# Patient Record
Sex: Female | Born: 1960 | Race: White | Hispanic: No | Marital: Married | Smoking: Former smoker
Health system: Southern US, Community
[De-identification: ages and names within clinical notes are randomized; demographics above are authoritative.]

## PROBLEM LIST (undated history)

## (undated) DIAGNOSIS — I2510 Atherosclerotic heart disease of native coronary artery without angina pectoris: Secondary | ICD-10-CM

## (undated) DIAGNOSIS — M6701 Short Achilles tendon (acquired), right ankle: Secondary | ICD-10-CM

## (undated) DIAGNOSIS — G5601 Carpal tunnel syndrome, right upper limb: Secondary | ICD-10-CM

## (undated) DIAGNOSIS — T8859XA Other complications of anesthesia, initial encounter: Secondary | ICD-10-CM

## (undated) DIAGNOSIS — J189 Pneumonia, unspecified organism: Secondary | ICD-10-CM

## (undated) DIAGNOSIS — G709 Myoneural disorder, unspecified: Secondary | ICD-10-CM

## (undated) DIAGNOSIS — N63 Unspecified lump in unspecified breast: Secondary | ICD-10-CM

## (undated) DIAGNOSIS — E119 Type 2 diabetes mellitus without complications: Secondary | ICD-10-CM

## (undated) DIAGNOSIS — E785 Hyperlipidemia, unspecified: Secondary | ICD-10-CM

## (undated) DIAGNOSIS — J45909 Unspecified asthma, uncomplicated: Secondary | ICD-10-CM

## (undated) DIAGNOSIS — R519 Headache, unspecified: Secondary | ICD-10-CM

## (undated) DIAGNOSIS — N959 Unspecified menopausal and perimenopausal disorder: Secondary | ICD-10-CM

## (undated) DIAGNOSIS — I739 Peripheral vascular disease, unspecified: Secondary | ICD-10-CM

## (undated) DIAGNOSIS — F329 Major depressive disorder, single episode, unspecified: Secondary | ICD-10-CM

## (undated) DIAGNOSIS — M1990 Unspecified osteoarthritis, unspecified site: Secondary | ICD-10-CM

## (undated) DIAGNOSIS — I10 Essential (primary) hypertension: Principal | ICD-10-CM

## (undated) DIAGNOSIS — K219 Gastro-esophageal reflux disease without esophagitis: Secondary | ICD-10-CM

## (undated) DIAGNOSIS — S37009A Unspecified injury of unspecified kidney, initial encounter: Secondary | ICD-10-CM

## (undated) DIAGNOSIS — E039 Hypothyroidism, unspecified: Secondary | ICD-10-CM

## (undated) DIAGNOSIS — J309 Allergic rhinitis, unspecified: Secondary | ICD-10-CM

## (undated) DIAGNOSIS — F419 Anxiety disorder, unspecified: Secondary | ICD-10-CM

## (undated) DIAGNOSIS — M545 Low back pain: Secondary | ICD-10-CM

## (undated) DIAGNOSIS — K759 Inflammatory liver disease, unspecified: Secondary | ICD-10-CM

## (undated) HISTORY — DX: Unspecified lump in unspecified breast: N63.0

## (undated) HISTORY — DX: Hyperlipidemia, unspecified: E78.5

## (undated) HISTORY — PX: HAGLAND'S DEFORMITY EXCISION: SHX1718

## (undated) HISTORY — PX: TUBAL LIGATION: SHX77

## (undated) HISTORY — DX: Allergic rhinitis, unspecified: J30.9

## (undated) HISTORY — DX: Short Achilles tendon (acquired), right ankle: M67.01

## (undated) HISTORY — DX: Hypothyroidism, unspecified: E03.9

## (undated) HISTORY — PX: FEMORAL BYPASS: SHX50

## (undated) HISTORY — DX: Major depressive disorder, single episode, unspecified: F32.9

## (undated) HISTORY — DX: Peripheral vascular disease, unspecified: I73.9

## (undated) HISTORY — DX: Low back pain: M54.5

## (undated) HISTORY — DX: Essential (primary) hypertension: I10

## (undated) HISTORY — DX: Unspecified menopausal and perimenopausal disorder: N95.9

## (undated) HISTORY — PX: CHOLECYSTECTOMY: SHX55

---

## 1983-02-15 HISTORY — PX: APPENDECTOMY: SHX54

## 1983-02-15 HISTORY — PX: CHOLECYSTECTOMY: SHX55

## 1994-02-14 HISTORY — PX: TUBAL LIGATION: SHX77

## 2004-10-14 ENCOUNTER — Encounter
Admission: RE | Admit: 2004-10-14 | Discharge: 2004-10-14 | Payer: Self-pay | Admitting: Physical Medicine and Rehabilitation

## 2004-11-29 ENCOUNTER — Encounter: Admission: RE | Admit: 2004-11-29 | Discharge: 2004-11-29 | Payer: Self-pay | Admitting: *Deleted

## 2005-02-21 ENCOUNTER — Inpatient Hospital Stay (HOSPITAL_COMMUNITY): Admission: RE | Admit: 2005-02-21 | Discharge: 2005-03-02 | Payer: Self-pay | Admitting: *Deleted

## 2005-03-04 ENCOUNTER — Inpatient Hospital Stay (HOSPITAL_COMMUNITY): Admission: EM | Admit: 2005-03-04 | Discharge: 2005-03-08 | Payer: Self-pay | Admitting: Emergency Medicine

## 2005-03-10 ENCOUNTER — Ambulatory Visit (HOSPITAL_COMMUNITY): Admission: RE | Admit: 2005-03-10 | Discharge: 2005-03-10 | Payer: Self-pay | Admitting: Surgical

## 2005-06-17 ENCOUNTER — Ambulatory Visit: Payer: Self-pay | Admitting: Internal Medicine

## 2005-07-29 ENCOUNTER — Ambulatory Visit: Payer: Self-pay | Admitting: Internal Medicine

## 2006-03-06 ENCOUNTER — Ambulatory Visit: Payer: Self-pay | Admitting: Internal Medicine

## 2006-08-04 ENCOUNTER — Encounter: Payer: Self-pay | Admitting: Internal Medicine

## 2006-08-04 ENCOUNTER — Ambulatory Visit: Payer: Self-pay | Admitting: Internal Medicine

## 2006-08-04 DIAGNOSIS — E119 Type 2 diabetes mellitus without complications: Secondary | ICD-10-CM | POA: Insufficient documentation

## 2006-08-04 DIAGNOSIS — E039 Hypothyroidism, unspecified: Secondary | ICD-10-CM

## 2006-08-04 DIAGNOSIS — M545 Low back pain, unspecified: Secondary | ICD-10-CM | POA: Insufficient documentation

## 2006-08-04 DIAGNOSIS — E785 Hyperlipidemia, unspecified: Secondary | ICD-10-CM | POA: Insufficient documentation

## 2006-08-04 DIAGNOSIS — I10 Essential (primary) hypertension: Secondary | ICD-10-CM | POA: Insufficient documentation

## 2006-08-04 DIAGNOSIS — I152 Hypertension secondary to endocrine disorders: Secondary | ICD-10-CM | POA: Insufficient documentation

## 2006-08-04 DIAGNOSIS — E1169 Type 2 diabetes mellitus with other specified complication: Secondary | ICD-10-CM | POA: Insufficient documentation

## 2006-08-04 HISTORY — DX: Hypothyroidism, unspecified: E03.9

## 2006-08-04 HISTORY — DX: Hyperlipidemia, unspecified: E78.5

## 2006-08-04 HISTORY — DX: Low back pain, unspecified: M54.50

## 2006-08-04 HISTORY — DX: Essential (primary) hypertension: I10

## 2007-06-26 ENCOUNTER — Telehealth: Payer: Self-pay | Admitting: Internal Medicine

## 2007-06-26 ENCOUNTER — Ambulatory Visit: Payer: Self-pay | Admitting: Internal Medicine

## 2007-06-26 LAB — CONVERTED CEMR LAB
ALT: 52 units/L — ABNORMAL HIGH (ref 0–35)
AST: 39 units/L — ABNORMAL HIGH (ref 0–37)
Albumin: 3.8 g/dL (ref 3.5–5.2)
Alkaline Phosphatase: 100 units/L (ref 39–117)
BUN: 9 mg/dL (ref 6–23)
Basophils Absolute: 0.1 10*3/uL (ref 0.0–0.1)
Basophils Relative: 0.9 % (ref 0.0–1.0)
Bilirubin Urine: NEGATIVE
Bilirubin, Direct: 0.1 mg/dL (ref 0.0–0.3)
Blood in Urine, dipstick: NEGATIVE
CO2: 28 meq/L (ref 19–32)
Calcium: 9.4 mg/dL (ref 8.4–10.5)
Chloride: 111 meq/L (ref 96–112)
Cholesterol: 188 mg/dL (ref 0–200)
Creatinine, Ser: 0.9 mg/dL (ref 0.4–1.2)
Eosinophils Absolute: 0.2 10*3/uL (ref 0.0–0.7)
Eosinophils Relative: 3.8 % (ref 0.0–5.0)
GFR calc Af Amer: 86 mL/min
GFR calc non Af Amer: 71 mL/min
Glucose, Bld: 110 mg/dL — ABNORMAL HIGH (ref 70–99)
Glucose, Urine, Semiquant: NEGATIVE
HCT: 41.2 % (ref 36.0–46.0)
HDL: 31.4 mg/dL — ABNORMAL LOW (ref >39.0)
Hemoglobin: 14.2 g/dL (ref 12.0–15.0)
Ketones, urine, test strip: NEGATIVE
LDL Cholesterol: 138 mg/dL — ABNORMAL HIGH (ref 0–99)
Lymphocytes Relative: 18 % (ref 12.0–46.0)
MCHC: 34.4 g/dL (ref 30.0–36.0)
MCV: 90.5 fL (ref 78.0–100.0)
Monocytes Absolute: 0.6 10*3/uL (ref 0.1–1.0)
Monocytes Relative: 9.2 % (ref 3.0–12.0)
Neutro Abs: 4 10*3/uL (ref 1.4–7.7)
Neutrophils Relative %: 68.1 % (ref 43.0–77.0)
Nitrite: NEGATIVE
Platelets: 207 10*3/uL (ref 150–400)
Potassium: 4.1 meq/L (ref 3.5–5.1)
RBC: 4.55 M/uL (ref 3.87–5.11)
RDW: 11.8 % (ref 11.5–14.6)
Sodium: 144 meq/L (ref 135–145)
Specific Gravity, Urine: 1.025
TSH: 0.03 microintl units/mL — ABNORMAL LOW (ref 0.35–5.50)
Total Bilirubin: 0.5 mg/dL (ref 0.3–1.2)
Total CHOL/HDL Ratio: 6
Total Protein: 7.2 g/dL (ref 6.0–8.3)
Triglycerides: 93 mg/dL (ref 0–149)
Urobilinogen, UA: 0.2
VLDL: 19 mg/dL (ref 0–40)
WBC Urine, dipstick: NEGATIVE
WBC: 6 10*3/uL (ref 4.5–10.5)
pH: 5

## 2008-04-10 ENCOUNTER — Ambulatory Visit: Payer: Self-pay | Admitting: Internal Medicine

## 2008-04-10 DIAGNOSIS — J309 Allergic rhinitis, unspecified: Secondary | ICD-10-CM | POA: Insufficient documentation

## 2008-04-10 HISTORY — DX: Allergic rhinitis, unspecified: J30.9

## 2008-05-02 ENCOUNTER — Encounter: Payer: Self-pay | Admitting: Internal Medicine

## 2008-05-08 ENCOUNTER — Ambulatory Visit: Payer: Self-pay | Admitting: Internal Medicine

## 2008-05-08 DIAGNOSIS — N959 Unspecified menopausal and perimenopausal disorder: Secondary | ICD-10-CM

## 2008-05-08 HISTORY — DX: Unspecified menopausal and perimenopausal disorder: N95.9

## 2008-06-23 ENCOUNTER — Telehealth: Payer: Self-pay | Admitting: Internal Medicine

## 2008-08-06 ENCOUNTER — Ambulatory Visit: Payer: Self-pay | Admitting: Internal Medicine

## 2008-08-06 LAB — CONVERTED CEMR LAB
ALT: 52 units/L — ABNORMAL HIGH (ref 0–35)
AST: 52 units/L — ABNORMAL HIGH (ref 0–37)
Albumin: 3.8 g/dL (ref 3.5–5.2)
Alkaline Phosphatase: 95 units/L (ref 39–117)
BUN: 12 mg/dL (ref 6–23)
Basophils Absolute: 0 10*3/uL (ref 0.0–0.1)
Basophils Relative: 0.5 % (ref 0.0–3.0)
Bilirubin Urine: NEGATIVE
Bilirubin, Direct: 0.1 mg/dL (ref 0.0–0.3)
Blood in Urine, dipstick: NEGATIVE
CO2: 26 meq/L (ref 19–32)
Calcium: 9 mg/dL (ref 8.4–10.5)
Chloride: 108 meq/L (ref 96–112)
Cholesterol: 156 mg/dL (ref 0–200)
Creatinine, Ser: 0.9 mg/dL (ref 0.4–1.2)
Eosinophils Absolute: 0.2 10*3/uL (ref 0.0–0.7)
Eosinophils Relative: 3.4 % (ref 0.0–5.0)
GFR calc non Af Amer: 70.93 mL/min (ref >60)
Glucose, Bld: 116 mg/dL — ABNORMAL HIGH (ref 70–99)
Glucose, Urine, Semiquant: NEGATIVE
HCT: 39.4 % (ref 36.0–46.0)
HDL: 30.8 mg/dL — ABNORMAL LOW (ref >39.00)
Hemoglobin: 13.7 g/dL (ref 12.0–15.0)
Ketones, urine, test strip: NEGATIVE
LDL Cholesterol: 103 mg/dL — ABNORMAL HIGH (ref 0–99)
Lymphocytes Relative: 23.1 % (ref 12.0–46.0)
Lymphs Abs: 1.3 10*3/uL (ref 0.7–4.0)
MCHC: 34.8 g/dL (ref 30.0–36.0)
MCV: 93.4 fL (ref 78.0–100.0)
Monocytes Absolute: 0.4 10*3/uL (ref 0.1–1.0)
Monocytes Relative: 6.6 % (ref 3.0–12.0)
Neutro Abs: 3.9 10*3/uL (ref 1.4–7.7)
Neutrophils Relative %: 66.4 % (ref 43.0–77.0)
Nitrite: NEGATIVE
Platelets: 169 10*3/uL (ref 150.0–400.0)
Potassium: 3.9 meq/L (ref 3.5–5.1)
Protein, U semiquant: NEGATIVE
RBC: 4.22 M/uL (ref 3.87–5.11)
RDW: 11.8 % (ref 11.5–14.6)
Sodium: 141 meq/L (ref 135–145)
Specific Gravity, Urine: 1.02
TSH: 2.63 microintl units/mL (ref 0.35–5.50)
Total Bilirubin: 0.7 mg/dL (ref 0.3–1.2)
Total CHOL/HDL Ratio: 5
Total Protein: 7.5 g/dL (ref 6.0–8.3)
Triglycerides: 111 mg/dL (ref 0.0–149.0)
Urobilinogen, UA: 0.2
VLDL: 22.2 mg/dL (ref 0.0–40.0)
WBC: 5.8 10*3/uL (ref 4.5–10.5)
pH: 5

## 2008-08-13 ENCOUNTER — Ambulatory Visit: Payer: Self-pay | Admitting: Internal Medicine

## 2008-08-13 DIAGNOSIS — F329 Major depressive disorder, single episode, unspecified: Secondary | ICD-10-CM

## 2008-08-13 DIAGNOSIS — F321 Major depressive disorder, single episode, moderate: Secondary | ICD-10-CM | POA: Insufficient documentation

## 2008-08-13 DIAGNOSIS — F3289 Other specified depressive episodes: Secondary | ICD-10-CM

## 2008-08-13 HISTORY — DX: Other specified depressive episodes: F32.89

## 2008-08-13 HISTORY — DX: Major depressive disorder, single episode, unspecified: F32.9

## 2009-02-16 ENCOUNTER — Ambulatory Visit: Payer: Self-pay | Admitting: Internal Medicine

## 2009-08-11 ENCOUNTER — Ambulatory Visit: Payer: Self-pay | Admitting: Internal Medicine

## 2009-08-11 LAB — CONVERTED CEMR LAB
ALT: 72 units/L — ABNORMAL HIGH (ref 0–35)
AST: 62 units/L — ABNORMAL HIGH (ref 0–37)
Albumin: 4.2 g/dL (ref 3.5–5.2)
Alkaline Phosphatase: 114 units/L (ref 39–117)
BUN: 14 mg/dL (ref 6–23)
Basophils Relative: 0.9 % (ref 0.0–3.0)
Bilirubin Urine: NEGATIVE
Bilirubin, Direct: 0.2 mg/dL (ref 0.0–0.3)
Blood in Urine, dipstick: NEGATIVE
CO2: 27 meq/L (ref 19–32)
Calcium: 9.4 mg/dL (ref 8.4–10.5)
Chloride: 106 meq/L (ref 96–112)
Cholesterol: 187 mg/dL (ref 0–200)
Creatinine, Ser: 0.9 mg/dL (ref 0.4–1.2)
Eosinophils Relative: 3.1 % (ref 0.0–5.0)
GFR calc non Af Amer: 68.01 mL/min (ref >60)
Glucose, Bld: 101 mg/dL — ABNORMAL HIGH (ref 70–99)
Glucose, Urine, Semiquant: NEGATIVE
HCT: 39.3 % (ref 36.0–46.0)
HDL: 40 mg/dL (ref >39.00)
Hemoglobin: 13.4 g/dL (ref 12.0–15.0)
Ketones, urine, test strip: NEGATIVE
LDL Cholesterol: 116 mg/dL — ABNORMAL HIGH (ref 0–99)
Lymphocytes Relative: 22.3 % (ref 12.0–46.0)
MCHC: 34 g/dL (ref 30.0–36.0)
MCV: 95.6 fL (ref 78.0–100.0)
Monocytes Relative: 7.4 % (ref 3.0–12.0)
Neutrophils Relative %: 66.3 % (ref 43.0–77.0)
Nitrite: NEGATIVE
Platelets: 160 10*3/uL (ref 150.0–400.0)
Potassium: 4.4 meq/L (ref 3.5–5.1)
Protein, U semiquant: NEGATIVE
RBC: 4.11 M/uL (ref 3.87–5.11)
RDW: 13.5 % (ref 11.5–14.6)
Sodium: 142 meq/L (ref 135–145)
Specific Gravity, Urine: 1.02
TSH: 25.33 microintl units/mL — ABNORMAL HIGH (ref 0.35–5.50)
Total Bilirubin: 0.4 mg/dL (ref 0.3–1.2)
Total CHOL/HDL Ratio: 5
Total Protein: 7.7 g/dL (ref 6.0–8.3)
Triglycerides: 155 mg/dL — ABNORMAL HIGH (ref 0.0–149.0)
Urobilinogen, UA: 0.2
VLDL: 31 mg/dL (ref 0.0–40.0)
WBC: 10.5 10*3/uL (ref 4.5–10.5)
pH: 5.5

## 2009-08-24 ENCOUNTER — Ambulatory Visit: Payer: Self-pay | Admitting: Internal Medicine

## 2010-03-16 NOTE — Assessment & Plan Note (Signed)
Summary: cpx/cjr/pt rescd from bump//ccm   Vital Signs:  Patient profile:   50 year old female Height:      65 inches Weight:      200 pounds BMI:     33.40 Temp:     98.1 degrees F oral BP sitting:   120 / 80  (right arm) Cuff size:   regular  Vitals Entered By: Cay Schillings LPN (August 25, 7987 2:11 PM) CC: cpx - doing well Is Patient Diabetic? No   CC:  cpx - doing well.  History of Present Illness: 50 year old patient who is seen today for a annual health examination.  Medical problems include VAD.  She is status post vascular bypass surgery.  She has treated hypertension and dyslipidemia, and also a history of hypothyroidism.  She has allergic rhinitis.  History depression  Allergies: 1)  * Flu Shot  Past History:  Past Medical History: Reviewed history from 08/13/2008 and no changes required. 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 Cholecystectomy Aortobifemoral bypass 2007  Family History: Reviewed history from 08/13/2008 and no changes required. Family History of CAD Female 1st degree relative <50 Father died 97  ASCVD/MI Mother died  age 59 -SDAT, HTN MGM-DM Uncles-CVA 3 brothers-   Social History: Reviewed history from 08/13/2008 and no changes required. Married- 30 yrs tobacco 30 yrs until 2007   Regular exercise-yes  Review of Systems       The patient complains of weight gain.  The patient denies anorexia, fever, weight loss, vision loss, decreased hearing, hoarseness, chest pain, syncope, dyspnea on exertion, peripheral edema, prolonged cough, headaches, hemoptysis, abdominal pain, melena, hematochezia, severe indigestion/heartburn, hematuria, incontinence, genital sores, muscle weakness, suspicious skin lesions,  transient blindness, difficulty walking, depression, unusual weight change, abnormal bleeding, enlarged lymph nodes, angioedema, and breast masses.    Physical Exam  General:  overweight-appearing.  120/80overweight-appearing.   Head:  Normocephalic and atraumatic without obvious abnormalities. No apparent alopecia or balding. Eyes:  No corneal or conjunctival inflammation noted. EOMI. Perrla. Funduscopic exam benign, without hemorrhages, exudates or papilledema. Vision grossly normal. Ears:  External ear exam shows no significant lesions or deformities.  Otoscopic examination reveals clear canals, tympanic membranes are intact bilaterally without bulging, retraction, inflammation or discharge. Hearing is grossly normal bilaterally. Mouth:  Oral mucosa and oropharynx without lesions or exudates.   Neck:  No deformities, masses, or tenderness noted. Chest Wall:  No deformities, masses, or tenderness noted. Lungs:  Normal respiratory effort, chest expands symmetrically. Lungs are clear to auscultation, no crackles or wheezes. Heart:  Normal rate and regular rhythm. S1 and S2 normal without gallop, murmur, click, rub or other extra sounds. Abdomen:  obese soft and nontender.  No organomegaly.  Surgical scars in the midline and right upper quadrant noted Msk:  No deformity or scoliosis noted of thoracic or lumbar spine.   Pulses:  dorsalis pedis pulses intact.  Posterior tibial  pulses faint Extremities:  No clubbing, cyanosis, edema, or deformity noted with normal full range of motion of all joints.   Neurologic:  No cranial nerve deficits noted. Station and gait are normal. Plantar reflexes are down-going bilaterally. DTRs are symmetrical throughout. Sensory, motor and coordinative functions appear intact. Skin:  Intact without suspicious lesions or rashes Cervical Nodes:  No lymphadenopathy noted Axillary Nodes:  No palpable lymphadenopathy Inguinal Nodes:  No significant  adenopathy Psych:   Cognition and judgment appear intact. Alert and cooperative with normal attention span and concentration. No apparent delusions, illusions, hallucinations   Impression & Recommendations:  Problem # 1:  PREVENTIVE HEALTH CARE (ICD-V70.0)  Complete Medication List: 1)  Benazepril Hcl 20 Mg Tabs (Benazepril hcl) .... Take 1 tablet by mouth once a day 2)  Metoprolol Tartrate 50 Mg Tabs (Metoprolol tartrate) .... Take 1 tablet by mouth twice a day 3)  Pravastatin Sodium 40 Mg Tabs (Pravastatin sodium) .... One daily 4)  Fexofenadine Hcl 180 Mg Tabs (Fexofenadine hcl) .... One daily 5)  Fluticasone Propionate 50 Mcg/act Susp (Fluticasone propionate) .... Use daily 6)  Venlafaxine Hcl 50 Mg Tabs (Venlafaxine hcl) .... One every morning 7)  Levothyroxine Sodium 100 Mcg Tabs (Levothyroxine sodium) .... One daily  Patient Instructions: 1)  Please schedule a follow-up appointment in 3 months to check your TSH 2)  TSH prior to visit, ICD-9:  244.9  3)  Limit your Sodium (Salt) to less than 2 grams a day(slightly less than 1/2 a teaspoon) to prevent fluid retention, swelling, or worsening of symptoms. 4)  It is important that you exercise regularly at least 20 minutes 5 times a week. If you develop chest pain, have severe difficulty breathing, or feel very tired , stop exercising immediately and seek medical attention. 5)  You need to lose weight. Consider a lower calorie diet and regular exercise.  6)  Check your Blood Pressure regularly. If it is above: 150/90  you should make an appointment. 7)  Schedule your mammogram. 8)  Take calcium +Vitamin D daily. 9)  gynecology follow-up as discussed Prescriptions: LEVOTHYROXINE SODIUM 100 MCG TABS (LEVOTHYROXINE SODIUM) one daily  #90 x 6   Entered and Authorized by:   Marletta Lor  MD   Signed by:   Marletta Lor  MD on 08/24/2009   Method used:   Electronically to        [facility] (retail)       [place], [place]  [address]       Ph: [phone]       Fax: [phone]   RxID:   [id number] VENLAFAXINE HCL 50 MG TABS (VENLAFAXINE HCL) one every morning  #90 x 6   Entered and Authorized by:   Marletta Lor  MD   Signed by:   Marletta Lor  MD on 08/24/2009   Method used:   Electronically to        [facility] (retail)       [place], [place]  [address]       Ph: [phone]       Fax: [phone]   RxID:   [id number] FLUTICASONE PROPIONATE 50 MCG/ACT SUSP (FLUTICASONE PROPIONATE) use daily  #1 x 6   Entered and Authorized by:   Marletta Lor  MD   Signed by:   Marletta Lor  MD on 08/24/2009   Method used:   Electronically to        [facility] (retail)       [place], [place]  [address]       Ph: [phone]       Fax: [phone]   RxID:   [id number] FEXOFENADINE HCL 180 MG TABS (FEXOFENADINE  HCL) one daily  #90 x 6   Entered and Authorized by:   Marletta Lor  MD   Signed by:   Marletta Lor  MD on 08/24/2009   Method used:   Electronically to        [facility] (retail)       [place], [place]  [address]       Ph: [phone]       Fax: [phone]   RxID:   [id number] PRAVASTATIN SODIUM 40 MG TABS (PRAVASTATIN SODIUM) one daily  #90 Tablet x 3   Entered and Authorized by:   Marletta Lor  MD   Signed by:   Marletta Lor  MD on 08/24/2009   Method used:   Electronically to        [facility] (retail)       [place], [place]  [address]       Ph: [phone]       Fax: [phone]   RxID:   [id number] METOPROLOL TARTRATE 50 MG TABS (METOPROLOL TARTRATE) Take 1 tablet by mouth twice a day  #180 x 3   Entered and Authorized by:   Marletta Lor  MD   Signed by:   Marletta Lor  MD on 08/24/2009   Method used:   Electronically to        [facility] (retail)       [place], [place]  [address]       Ph: [phone]       Fax: [phone]   RxID:   [id number] BENAZEPRIL HCL 20 MG TABS (BENAZEPRIL HCL) Take 1 tablet by mouth once a day  #90 Tablet x 2   Entered and Authorized by:   Marletta Lor  MD   Signed by:   Marletta Lor  MD on 08/24/2009   Method used:   Electronically to        [facility] (retail)       [place], [place]  [address]       Ph: [phone]       Fax: [phone]   RxID:   [id number]

## 2010-03-16 NOTE — Assessment & Plan Note (Signed)
Summary: 6 MNTH ROV//SLM/PT RESCD FROM BUMP//CCM   Vital Signs:  Patient profile:   50 year old female Weight:      193 pounds Temp:     98.0 degrees F oral BP sitting:   100 / 78  (left arm) Cuff size:   regular  Vitals Entered By: Chipper Oman, RN (February 16, 2009 10:30 AM) CC: 6 mo ROV   CC:  6 mo ROV.  History of Present Illness: 50 year old patient who is seen today for follow-up of her hypertension.  She has a history of dyslipidemia, and mildly elevated LFTs on statin therapy.  She also has a history of mild impaired glucose tolerance.  No concentric complaints today.  She remains off tobacco.  She does have a history of peripheral vascular disease.  She denies any cardiopulmonary complaints or any claudication  Allergies: 1)  * Flu Shot  Past History:  Past Medical History: Reviewed history from 08/13/2008 and no changes required. 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    Physical Exam  General:  overweight-appearing.  120/78overweight-appearing.   Head:  Normocephalic and atraumatic without obvious abnormalities. No apparent alopecia or balding. Eyes:  No corneal or conjunctival inflammation noted. EOMI. Perrla. Funduscopic exam benign, without hemorrhages, exudates or papilledema. Vision grossly  normal. Mouth:  Oral mucosa and oropharynx without lesions or exudates.  Teeth in good repair. Neck:  No deformities, masses, or tenderness noted. Lungs:  Normal respiratory effort, chest expands symmetrically. Lungs are clear to auscultation, no crackles or wheezes. Heart:  Normal rate and regular rhythm. S1 and S2 normal without gallop, murmur, click, rub or other extra sounds. Abdomen:  Bowel sounds positive,abdomen soft and non-tender without masses, organomegaly or hernias noted.   Impression & Recommendations:  Problem # 1:  HYPERTENSION (ICD-401.9)  Her updated medication list for this problem includes:    Benazepril Hcl 20 Mg Tabs (Benazepril hcl) .Marland Kitchen... Take 1 tablet by mouth once a day    Metoprolol Tartrate 50 Mg Tabs (Metoprolol tartrate) .Marland Kitchen... Take 1 tablet by mouth twice a day  Her updated medication list for this problem includes:    Benazepril Hcl 20 Mg Tabs (Benazepril hcl) .Marland Kitchen... Take 1 tablet by mouth once a day    Metoprolol Tartrate 50 Mg Tabs (Metoprolol tartrate) .Marland Kitchen... Take 1 tablet by mouth twice a day  Problem # 2:  HYPERLIPIDEMIA (ICD-272.4)  Her updated medication list for this problem includes:    Pravastatin Sodium 40 Mg Tabs (Pravastatin sodium) ..... One daily  Her updated medication list for this problem includes:    Pravastatin Sodium 40 Mg Tabs (Pravastatin sodium) ..... One daily  Complete Medication List: 1)  Benazepril Hcl 20 Mg Tabs (Benazepril hcl) .... Take 1 tablet by mouth once a day 2)  Metoprolol Tartrate 50 Mg Tabs (Metoprolol tartrate) .... Take 1 tablet by mouth twice a day 3)  Pravastatin Sodium 40 Mg Tabs (Pravastatin sodium) .... One daily 4)  Fexofenadine Hcl 180 Mg Tabs (Fexofenadine hcl) .... One daily 5)  Fluticasone Propionate 50  Mcg/act Susp (Fluticasone propionate) .... Use daily 6)  Venlafaxine Hcl 50 Mg Tabs (Venlafaxine hcl) .... One every morning 7)  Levothyroxine Sodium 75 Mcg Tabs (Levothyroxine sodium) .... One  daily  Patient Instructions: 1)  Please schedule a follow-up appointment in 6 months. 2)  Limit your Sodium (Salt). 3)  It is important that you exercise regularly at least 20 minutes 5 times a week. If you develop chest pain, have severe difficulty breathing, or feel very tired , stop exercising immediately and seek medical attention. 4)  You need to lose weight. Consider a lower calorie diet and regular exercise.  5)  Check your Blood Pressure regularly. If it is above: you should make an appointment.

## 2010-03-16 NOTE — Miscellaneous (Signed)
Summary: Flonase pharmacy sig question  Clinical Lists Changes  Medications: Changed medication from FLUTICASONE PROPIONATE 50 MCG/ACT SUSP (FLUTICASONE PROPIONATE) use daily to FLUTICASONE PROPIONATE 50 MCG/ACT SUSP (FLUTICASONE PROPIONATE) one spray to each nostril  daily

## 2010-06-17 ENCOUNTER — Other Ambulatory Visit: Payer: Self-pay | Admitting: Internal Medicine

## 2010-07-02 ENCOUNTER — Encounter: Payer: Self-pay | Admitting: Internal Medicine

## 2010-07-02 ENCOUNTER — Ambulatory Visit (INDEPENDENT_AMBULATORY_CARE_PROVIDER_SITE_OTHER): Payer: 59 | Admitting: Internal Medicine

## 2010-07-02 DIAGNOSIS — J309 Allergic rhinitis, unspecified: Secondary | ICD-10-CM

## 2010-07-02 DIAGNOSIS — I10 Essential (primary) hypertension: Secondary | ICD-10-CM

## 2010-07-02 NOTE — Discharge Summary (Signed)
NAMESAMORA, JERNBERG                  ACCOUNT NO.:  [id number]   MEDICAL RECORD NO.:  [id number]          PATIENT TYPE:  INP   LOCATION:  2010                         FACILITY:  [facility]   PHYSICIAN:  Dorothea Glassman, M.D.    DATE OF BIRTH:  1960-08-02   DATE OF ADMISSION:  02/21/2005  DATE OF DISCHARGE:  02/26/2005                                 DISCHARGE SUMMARY   HISTORY OF PRESENT ILLNESS:  The patient is a 50 year old Caucasian female  first seen by Dr. Amedeo Plenty on November 15, 2004 for a progressive 2 year history  of exercise induced leg pain, which occurred after walking approximately 1/2  block. She described pain as a heaviness in her calf and thigh, affecting  her right leg more than the left. She also related that she had numbness in  her feet. In fact, over the last 6 months, the numbness is almost constant  in her right foot. She also developed occasional rest pain symptoms in her  right foot as well. There are no rest pain symptoms in her left and no  evidence of skin loss or ulcerations. She was first seen by Dr. Nelva Bush for  her leg pain and she had a known history of spondylolisthesis at L5-S1 and  she did receive a nerve block in July. However, with persistent symptoms, he  felt that the patient should be ruled out for vascular claudication and she  underwent a Doppler study at [facility], which showed AVI of 0.61 on  the right and 0.57 on the left with reduction of 2.37 on the right and 0.28  on the left after treadmill exercise testing. Wave forms were consistent  with aortoiliac occlusive disease, and Dr. Amedeo Plenty recommended that she  undergo a CT angiogram, which subsequently revealed a complete occlusion of  the distal abdominal aorta with reconstitution of the iliac vessels, single  renal artery bilaterally which were widely patent, as well as a celiac and  SMA, which were widely patent; however, the IMA was occluded, there was no  significant run-off disease in the  lower extremities. Based on her symptoms  and findings, Dr. Amedeo Plenty recommended that she undergo an aorto bi-femoral  bypass and she was admitted to this hospitalization for the procedure. Of  note, the patient did have preoperative cardiac clearance with a Persantine  cardio perfusion study done at Dr. Jenness [place] office, which was  interpreted as a low risk study with normal ejection fraction of 78%.   PAST MEDICAL HISTORY:  1.  Hypertension.  2.  Hyperlipidemia.  3.  Tobacco abuse, recently trying to quit.  4.  History of mitral valve prolapse.  5.  Graves disease status post radioactive Iodine treatment with resulting      hypothyroidism.   PAST SURGICAL HISTORY:  1.  Tubal ligation 1996.  2.  Cholecystectomy in 1983.   ALLERGIES:  She reports local swelling associated with fever after receiving  flu shot.   ADMISSION MEDICATIONS:  1.  Levoxyl 125 mg daily.  2.  Toprol XL 100 mg daily.  3.  Vytorin  10/20 1 daily.  4.  Alprazolam 0.5 mg 1/2 tablet to 1 tablet p.r.n. for anxiety.  5.  Commit lozenges 4 mg p.o. 1 to 2 hours p.r.n.   FAMILY HISTORY/SOCIAL HISTORY/REVIEW OF SYSTEMS/PHYSICAL EXAMINATION:  Please see the history and physical done at the time of admission.   HOSPITAL COURSE:  The patient was admitted electively and on February 21, 2005, taken to the operating room where she underwent the following  procedure:  Aorto bi-femoral bypass grafting with a 14 x 7 Dacron Hemashield  graft. The procedure was performed by P. Drucie Opitz, MD. Tolerated well and  she was taken to the Surgical Intensive Care Unit in stable condition.   POSTOPERATIVE HOSPITAL COURSE:  The patient has done quite well. She has  remained hemodynamically stable with no significant cardiac dysrhythmias.  ABI's done postoperatively reveal improvement on both sides to a level of  greater than 1.0 bilaterally. She has palpable dorsalis pedis pulses.  Incisions are healing well without evidence of  infection. She is advanced in  a routine manner regarding resolution of her postoperative ileus and  advancement in diet. Her oxygen has been weaned and she maintains good  saturations on room air. She is afebrile. Her overall rehabilitation status  is felt to be quite steady and improving daily. Tentatively she is felt to  be stable for discharge in the morning of February 27, 2004, pending morning  round re-evaluation.   DISCHARGE MEDICATIONS:  Are as preoperatively. Additionally for pain, Tylox  1 to 2 every 6 hours as needed.   SPECIAL INSTRUCTIONS:  The patient received written instructions regarding  medications, activity, diet, wound care and followup.   FOLLOW UP:  1.  Dr. Amedeo Plenty, an appointment on March 24, 2005 at 3:00 p.m.  2.  Additionally a staple removal appointment will be made for the patient.  3.  She will followup with her other physicians as they deem necessary.   FINAL DIAGNOSES:  1.  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  Hypertension.  3.  Hyperlipidemia  4.  Ongoing and chronic tobacco abuse.  5.  History of mitral valve prolapse.  6.  History of Graves disease status post treatment.   PREVIOUS SURGICAL HISTORY:  As listed in the dictation.      John Giovanni, P.A.-C.      Dorothea Glassman, M.D.  Electronically Signed    WEG/MEDQ  D:  02/25/2005  T:  02/26/2005  Job:  [id number]   cc:   Dorothea Glassman, M.D.  [address]  [place] [address]   Steve A. Everlene Farrier, M.D.  Fax: [phone]   Quay Burow, M.D.  Fax: [phone]   Janice Coffin. Nelva Bush, M.D.  Fax: [phone]

## 2010-07-02 NOTE — H&P (Signed)
NAMEGLENORA, [name]                  ACCOUNT NO.:  [id number]   MEDICAL RECORD NO.:  [id number]          PATIENT TYPE:  INP   LOCATION:  NA                           FACILITY:  [facility]   PHYSICIAN:  Dorothea Glassman, M.D.    DATE OF BIRTH:  16-Aug-1960   DATE OF ADMISSION:  02/21/2005  DATE OF DISCHARGE:                                HISTORY & PHYSICAL   PRIMARY CARE PHYSICIAN:  Richardson Landry A. Everlene Farrier, M.D.   CARDIOLOGIST:  Quay Burow, M.D.   ORTHOPEDIST:  Janice Coffin. Ramos, M.D.   CHIEF COMPLAINT:  Progressive bilateral claudication for two years.   HISTORY OF PRESENT ILLNESS:  Ms. [name] is a 50 year old Caucasian female  first seen by Dr. Amedeo Plenty on November 15, 2004, for a progressive two year  history of exercise induced leg pain which occurred after walking  approximately 1/2 block.  She described pain as a heaviness in her calf and  thigh, affecting her right leg more than the left.  She also has numbness in  her feet.  In fact, over the last six months, the numbness is almost  constant in her right foot.  She has also developed occasional rest pain  symptoms in her right foot as well.  There are no rest pain symptoms in her  left leg and no ulcers bilaterally.  She was first seen by Dr. Nelva Bush for her  leg pain as she has a known history of spondylolisthesis at L5 and S1, and  did receive a nerve block in July;  however, with persistent symptoms, he  felt she should be ruled out for vascular claudication, and she ultimately  underwent arterial Doppler's at [facility] which showed a resting  ABI of 0.61 on the right, and 0.57 on the left, with reduction to 0.37 on  the right and 0.28 on the left after treadmill exercise.  Wave forms were  consistent with aorto-iliac occlusive disease, and Dr. Amedeo Plenty recommended she  undergo a CT angiogram which showed complete occlusion of the distal  abdominal aorta with reconstitution of the iliac vessels, single renal  artery bilaterally which were  widely patent, as well as celiac artery and  SMA were widely patent;  however, the IMA was occluded, there was no  significant run-off disease in the lower extremities.  Based on her symptoms  and these findings, Dr. Amedeo Plenty recommended that she undergo an aorto-bi-  femoral bypass grafting.  Prior to scheduling surgery, she underwent a  Persantine cardioperfusion study by Dr. Quay Burow which was interpreted  as a low risk study with normal ejection fraction of 78%.   PAST MEDICAL HISTORY:  1.  Hypertension.  2.  Hyperlipidemia.  3.  Tobacco abuse, recently trying to quit.  4.  History of mitral valve prolapse which she says has not been evaluated      in over 20 years.  5.  Graves disease, status post radioactive iodine treatment with resulting      hypothyroidism.   PAST SURGICAL HISTORY:  1.  Tubal ligation in 1996.  2.  Cholecystectomy in 1983.  ALLERGIES:  She had a local swelling associated with fever after receiving  the flu shot.   MEDICATIONS:  1.  Levoxyl 125 mg p.o. daily.  2.  Toprol XL 100 mg p.o. one tablet daily.  3.  Vytorin 10/20 mg p.o. daily.  4.  Alprazolam 0.5 mg 1/2 tablet to one tablet p.r.n. anxiety.  5.  Commit lozenges 4 mg p.o. one to two hours p.r.n.   REVIEW OF SYSTEMS:  Please refer to history of present illness for pertinent  positives and negatives.  In addition, she does report occasional headaches  and palpitations, as well as occasional constipation.  She has increased  flatulence and is on Vytorin.  She also has frequent sinus infections.   SOCIAL HISTORY:  She is married with two children.  She recently moved to  [place] about a year ago from [place].  She has a history of smoking  one pack of cigarettes per day for about 20 years, but is now trying to  quit.  There is no significant alcohol use.  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  She lives in  [place] with her husband.    FAMILY HISTORY:  Her mother is alive at 3, with a history of coronary  artery disease and arthritis.  Her father died at age 18, from myocardial  infarction and had a history of atherosclerosis.  She has two brothers with  hypertension and hyperlipidemia, and a grandmother who died at 62, and had a  history of diabetes mellitus.   PHYSICAL EXAMINATION:  VITAL SIGNS:  Blood pressure 130/84, heart rate 72,  respirations 18.  GENERAL APPEARANCE:  This is a 50 year old Caucasian female who is alert,  cooperative, in no acute distress.  HEENT:  Head is normocephalic, atraumatic.  Pupils equal, round, reactive to  light.  Oral mucosa is pink and moist.  No lesions or erythema is noted.  Sclerae non-icteric.  NECK:  Supple.  No thyromegaly or lymphadenopathy is noted.  She has  palpable carotid pulses, and no bruits were auscultated.  RESPIRATORY:  Lung sounds are clear, unlabored, and symmetrical on  inspiration.  CARDIAC:  Heart has a regular rate and rhythm.  I do not appreciate a  murmur, rub, or gallop.  ABDOMEN:  Soft, nontender, nondistended, with normoactive bowel sounds, no  hepatomegaly or masses were noted.  GENITOURINARY:  Deferred.  RECTAL:  Deferred.  EXTREMITIES:  No edema.  Feet temperature is slightly decreased.  No  ulcerations were noted.  She has 2+ radial pulses bilaterally, but I was  unable to palpate femoral, dorsalis pedis, or posterior tibial pulses  bilaterally.  NEUROLOGIC:  Grossly intact.  She is alert and oriented x4.  Speech is  clear.  Gait is steady.  Muscle strength is 5/5 in her bilateral upper and  lower extremities.   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   PLAN:  She will be electively admitted to [facility] to undergo  aorto-bi-femoral bypass grafting by Dr. Gordy Clement on February 21, 2005,  at [facility].     Jacinta Shoe, P.A.      Dorothea Glassman, M.D.   Electronically Signed    AWZ/MEDQ  D:  02/17/2005  T:  02/17/2005  Job:  [id number]

## 2010-07-02 NOTE — Patient Instructions (Signed)
Return in 6 months for follow-up    It is important that you exercise regularly, at least 20 minutes 3 to 4 times per week.  If you develop chest pain or shortness of breath seek  medical attention.  You need to lose weight.  Consider a lower calorie diet and regular exercise.

## 2010-07-02 NOTE — Op Note (Signed)
NAMEFAVEN, WATTERSON                  ACCOUNT NO.:  [id number]   MEDICAL RECORD NO.:  [id number]          PATIENT TYPE:  INP   LOCATION:  2314                         FACILITY:  [facility]   PHYSICIAN:  Dorothea Glassman, M.D.    DATE OF BIRTH:  [birth year]   DATE OF PROCEDURE:  02/21/2005  DATE OF DISCHARGE:                                 OPERATIVE REPORT   SURGEON:  P Cameron Sprang, MD   ASSISTANT:  Mamie Nick, MD and Suzzanne Cloud, P.A.   ANESTHETIC:  General endotracheal.   ANESTHESIOLOGIST:  [place]   PREOPERATIVE DIAGNOSIS:  Bilateral lower extremity claudication.   POSTOP DIAGNOSIS:  Bilateral lower extremity claudication.   PROCEDURE:  A bifemoral bypass with 14 x 7 mm Dacron Hemashield graft.   CLINICAL NOTE:  [name] is a 50 year old female referred for evaluation  of claudication symptoms. 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 MR angiography revealed these findings. Runoff below the  inguinal ligament was excellent bilaterally.  It was recommended she undergo  aortobifemoral bypass for relief of significant claudication. She agreed to  procedure. Risks of the operative procedure explained to the patient in  detail with a major morbidity and mortality of 3-5% to include but not  limited to MI, CVA, renal failure, limb loss, infection, bleeding,  transfusion risk and death. The patient consented for surgery.   OPERATIVE PROCEDURE:  The patient brought to the operating room stable  hemodynamic condition. Foley catheter, arterial line, Swan-Ganz catheter in  place. General endotracheal anesthesia induced in the supine position.   Midline abdominal skin incision made from xiphoid to umbilicus. Dissection  carried through subcutaneous tissue electrocautery. Linea alba incised  without difficulty. Peritoneal cavity entered without difficulty.   The patient is status post a laparoscopic cholecystectomy. Number of  adhesions were present in the  right upper quadrant. These did not limit  exposure for the operative procedure. They did however, limit evaluation of  the right upper quadrant. The stomach and duodenum were normal. Liver was  not significantly evaluated, however of the left lateral segment appeared  normal. The colon was unremarkable. The uterus was mildly boggy. The  fallopian tubes were normal. The ovaries was small without cystic  structures. The small bowel was unremarkable.   The small bowel retracted to the right, transverse colon brought superiorly.  The retroperitoneum incised over the infrarenal aorta. The infrarenal aorta  was occluded just beyond the origin of the renal arteries. The inferior  mesenteric vein and renal vein were both skeletonized and retracted  superiorly. The juxtarenal aorta was exposed. The renal arteries exposed  bilaterally and encircled with vessel loop. The suprarenal aorta cleared  adequately for placement of clamp. The infrarenal aorta then exposed and  along its anterior wall down to the aortic bifurcation. Short  retroperitoneal tunnels were begun from the bifurcation distally. Common  iliac arteries bilaterally revealed extensive plaque.   Paired longitudinal skin incisions were then made at the inguinal ligament.  Dissection carried down through subcutaneous tissue and lymphatics with  electrocautery.  Inguinal  ligaments were identified. The common femoral  artery exposed at the inguinal ligament, encircled with vessel loop. Distal  dissection carried down along the common femoral arteries to expose the  origin of the profunda and superficial femoral arteries which were each  encircled with vessel loops.   Retroperitoneal tunnels were then completed from the groin to the  bifurcation.  Tunneling was carried out behind the ureters bilaterally.   The patient administered 25 grams of mannitol intravenously, 7000 units  heparin intravenously. The aorta was then controlled, was  then clamped in  immediate suprarenal position with an aortic DeBakey clamp. Renal arteries  controlled bilaterally with fine bulldog clamps. The infrarenal aorta  divided transversely. The distal infrarenal aorta was beveled and an  oversewn with a double layer of running 3-0 Vicryl scar running 3-0 Prolene  suture. 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 This was  entered. Thromboendarterectomy was then carried out up to the level of the  renal arteries, being careful not to involve the renal artery origins in the  endarterectomy.   A 14 x 7 Hemashield graft was then chosen. It was divided appropriate length  and anastomosed end-to-end to the infrarenal aorta using a running stitch 4-  0 Prolene suture. At completion of the proximal anastomosis, each of the  renal arteries were flushed. The graft was flushed. Clamps were removed.  Kidneys reperfused with ischemia time 29 minutes. Each limb of the  bifurcation graft was then controlled with a straight Fogarty clamp.   Each limb of the bifurcation graft was then tunneled through the  retroperitoneum to the appropriate groin.   The femoral vessels controlled bilaterally with Henley clamp proximally,  bulldog clamps on the profunda and superficial femoral artery. Longitudinal  arteriotomy made at the common femoral artery level bilaterally. Moderate  amount of posterior plaque was present. Common femoral artery otherwise  widely patent. Each limb of the graft was beveled and anastomosed end-to-  side to the common femoral artery using running 6-0 Prolene suture. At  completion of each femoral anastomosis, each limb of the graft was flushed.  Clamps removed and each leg reperfused without difficulty. 2+ dorsalis pedis  pulse palpable bilaterally.   Adequate hemostasis obtained. Sponge and instrument counts correct. The  patient administered 50 milligrams protamine intravenously.  The groin incisions were irrigated  with antibiotic solution. The groins then  closed with running 2-0 Vicryl suture in two subcutaneous layers and staples  applied to skin.   The abdomen was examination to assure there were retained instruments or  sponges. The retroperitoneum then closed over the graft using running 3-0  Vicryl suture.   The small bowel was then returned to the abdomen and the omentum placed  anteriorly.   The midline fascia was then closed with running #1 PDS suture. The  subcutaneous tissue irrigated. Adequate hemostasis obtained. Subcutaneous  tissue loosely reapproximated with interrupted 3-0 Vicryl suture. Skin  closed with staples. Sterile dressings applied.   The patient was extubated in the operating room. No apparent complications.  Transferred to recovery in stable hemodynamic condition. Brisk urine output  present. Both legs well perfused with palpable pedal pulses.      Dorothea Glassman, M.D.  Electronically Signed     PGH/MEDQ  D:  02/21/2005  T:  02/22/2005  Job:  [id number]

## 2010-07-02 NOTE — Discharge Summary (Signed)
NAMELAURENE, [name]                  ACCOUNT NO.:  [id number]   MEDICAL RECORD NO.:  [id number]          PATIENT TYPE:  INP   LOCATION:  2010                         FACILITY:  [facility]   PHYSICIAN:  Darlin Coco, PA DATE OF BIRTH:  1960/09/30   DATE OF ADMISSION:  02/21/2005  DATE OF DISCHARGE:                                 DISCHARGE SUMMARY   ADDENDUM:  The patient was tentatively planned for discharge February 26, 2005, but this was held due to patient developing a small ileus.  She had  several episodes of vomiting evening of postop day four.  The morning of  postop day five, her diet was decreased to ice chips and sips of water.  Her  vomiting improved, but patient still had episodes of nausea.  She remained  on ice chips and water postop day five.  She was stable.  On postop day six,  diet increased to full liquids.  The patient tolerated this well.  No  vomiting, but slightly nauseated.  Her diet was increased to a soft diet  postop day eight.  The patient tolerated this well and no problems.  The  patient also developed some diarrhea.  This was checked for C. difficile and  was deemed to be negative.  All off the patient's stool softeners and  suppositories were discontinued.  The patient continued to have some  diarrhea.  If continues to have diarrhea at discharge, we will consider some  Imodium.  The patient's incisions remained dry and intact and healing well.  Every other staple was discontinued on morning of postop day 9.  TSH level  is checked prior to DC, which is still pending.  Patient continues to be out  of bed ambulating well.  Vitals remained stable, afebrile.  She remained O2  sat greater than 90% on room air.   PLAN:  The patient is tentatively ready for DC postop day nine March 04, 2005.  Please see discharge instructions dictated already.  These will  remain the same.      Darlin Coco, PA     KMD/MEDQ  D:  03/01/2005  T:  03/01/2005  Job:   [id number]

## 2010-07-02 NOTE — Progress Notes (Signed)
  Subjective:    Patient ID: [name], female    DOB: [birth year], 50 y.o.   MRN: [id number]  HPI 50 year old patient who has a history of allergic rhinitis. She presents with a five-day history of worsening sinus congestion and some associated headache. There's been no fever cough. There has been some excessive rhinorrhea. She returned from a trip to [place] last Sunday.    Review of Systems  Constitutional: Positive for fatigue.  HENT: Positive for congestion, rhinorrhea and postnasal drip. Negative for hearing loss, sore throat, dental problem, sinus pressure and tinnitus.   Eyes: Negative for pain, discharge and visual disturbance.  Respiratory: Negative for cough and shortness of breath.   Cardiovascular: Negative for chest pain, palpitations and leg swelling.  Gastrointestinal: Negative for nausea, vomiting, abdominal pain, diarrhea, constipation, blood in stool and abdominal distention.  Genitourinary: Negative for dysuria, urgency, frequency, hematuria, flank pain, vaginal bleeding, vaginal discharge, difficulty urinating, vaginal pain and pelvic pain.  Musculoskeletal: Negative for joint swelling, arthralgias and gait problem.  Skin: Negative for rash.  Neurological: Negative for dizziness, syncope, speech difficulty, weakness, numbness and headaches.  Hematological: Negative for adenopathy.  Psychiatric/Behavioral: Negative for behavioral problems, dysphoric mood and agitation. The patient is not nervous/anxious.        Objective:   Physical Exam  Constitutional: She is oriented to person, place, and time. She appears well-developed and well-nourished.  HENT:  Head: Normocephalic.  Right Ear: External ear normal.  Left Ear: External ear normal.  Mouth/Throat: Oropharynx is clear and moist.  Eyes: Conjunctivae and EOM are normal. Pupils are equal, round, and reactive to light.  Neck: Normal range of motion. Neck supple. No thyromegaly present.  Cardiovascular: Normal  rate, regular rhythm, normal heart sounds and intact distal pulses.   Pulmonary/Chest: Effort normal and breath sounds normal.  Abdominal: Soft. Bowel sounds are normal. She exhibits no mass. There is no tenderness.  Musculoskeletal: Normal range of motion.  Lymphadenopathy:    She has no cervical adenopathy.  Neurological: She is alert and oriented to person, place, and time.  Skin: Skin is warm and dry. No rash noted.  Psychiatric: She has a normal mood and affect. Her behavior is normal.          Assessment & Plan:   Allergic rhinitis. Exacerbation. We'll continue her maintenance fexofenadine and fluticasone nasal spray. Will treat with Depo-Medrol 80 mg IM. Recheck in 6 months

## 2010-07-02 NOTE — Consult Note (Signed)
[name], HALK NO.:  [id number]   MEDICAL RECORD NO.:  [id number]          PATIENT TYPE:  INP   LOCATION:  2035                         FACILITY:  [facility]   PHYSICIAN:  Sherril Croon, M.D.   DATE OF BIRTH:  01/03/1961   DATE OF CONSULTATION:  DATE OF DISCHARGE:                                   CONSULTATION   REASON FOR CONSULTATION:  Elevated serum creatinine.   HISTORY OF PRESENT ILLNESS:  This is a 50 year old Caucasian lady who  underwent aortobifemoral bypass on February 21, 2005.  She was discharged from  [facility] on March 02, 2005 in good health but shortly after  discharge, developed nausea, vomiting and poor p.o. intake with no  associated diarrhea.  She was readmitted to [facility] on March 03, 2005 with an elevated serum creatinine and markedly volume depleted.  On  discharge from the hospital on January 15, the last renal panel revealed a  serum creatinine of 0.7.  She denied any purple discoloration to her toes.  She states she felt light-headed, especially on exertion and changing  position from sitting to standing.  She denied any abdominal pain.  She  denied any use of antibiotics, nonsteroidal anti-inflammatory drugs or ACE  inhibitors.   PAST MEDICAL HISTORY:  1.  Status post aortobifemoral bypass on February 21, 2005.  2.  History of cholecystectomy 20 years ago.  3.  History of hypertension.  4.  __________  5.  Hyperlipidemia.  6.  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  7.  History of mitral valve prolapse.  8.  History of tubal ligation in 1996.   MEDICATIONS:  1.  Protonix 40 mg daily.  2.  Levoxyl 50 mcg daily.  3.  Toprol-XL 100 mg daily.  4.  Vytorin 10/20 mg daily.  5.  Xanax p.r.n.   ALLERGIES:  FLU SHOTS.   SOCIAL HISTORY:  Married, one son.  Quit tobacco.  No alcohol.   FAMILY HISTORY:  Positive for coronary artery disease.  Father died at 50.  Mother still living at 77.  She has  three brothers.   REVIEW OF SYSTEMS:  GENERAL:  She denies fatigue, fevers, sweats, chills.  EYES:  No visual complaints.  No history of eye operation.  No history of  red eye or blurred vision.  EARS, NOSE, MOUTH, THROAT:  No hearing loss,  sore throat, epistaxis or mass lesions.  CARDIOVASCULAR:  No history of  anginal chest pain.  History of mitral valve prolapse.  No history of  palpitations, syncope, spells.  No history of ankle or leg swelling.  RESPIRATORY system:  Denies cough, wheezing or hemoptysis.  ABDOMEN system:  No diarrhea.  She was admitted for vomiting which has since discontinued.  She has had a cholecystectomy in the past.  She denies any history of liver  or pancreatic disease.  UROGENITAL:  She had some decrease in her urine  output on admission.  She denies any history of dysuria, frequency, urgency  or hematuria.  No history of renal calculi.  MUSCULOSKELETAL:  She denies  any arthralgias or myalgias.  She denies any history of gout.  No use of  nonsteroidal anti-inflammatory drugs.  DERMATOLOGIC:  She denies any skin  rashes or itching.  NEUROLOGIC:  No stroke, seizures, numbness, tingling or  pins and needles.  No weakness in the upper or lower extremities.  However,  on admission, she did develop some spasming of her hands probably related to  hypokalemia.   PHYSICAL EXAMINATION:  Alert, very pleasant lady in no obvious distress.  Laying comfortably in bed.  Blood pressure 120/70, pulse 75, temperature  afebrile.  EYES AND NOSE:  Positive 2 L.  HEAD AND NECK:  Normocephalic, atraumatic.  No icterus or pallor.  EARS, NOSE AND THROAT:  External TMs normal.  Nasal mucosa clear.  Oropharynx was dry.  NECK:  Flat neck veins.  No carotid bruits.  No thyromegaly.  CARDIOVASCULAR:  Regular rate and rhythm.  No heaves, thrills, rubs or  gallops.  RESPIRATORY:  Lungs were clear to auscultation and percussion.  No wheeze or  rales.  ABDOMEN:  Soft, nontender, bowel  sounds were present, no abdominal bruits.  EXTREMITIES:  No clubbing, cyanosis or edema.  No emboli.  No livedo  reticularis.   LABS:  Sodium 132, potassium 4.1, chloride 107, CO2 26, BUN 18, creatinine  2.2, glucose 129, calcium 8.2.  H&H 11.4 and 33.2.  Urinalysis 100 mg  protein, 2+ ketones, specific gravity 1.021, hyaline casts.   ASSESSMENT AND PLAN:  1.  Acute renal failure, volume depletion with some acute tubular necrosis.      No improvement.  I doubt cholesterol emboli syndrome or acute      interstitial nephritis.  Will check a urinalysis with sediment      examination and also renal ultrasound to rule out any obstruction or      renal lesions.  2.  Hypertension, volume.  She still appears moderately volume depleted.      She will continue oral rehydration tonight and without any intravenous      hydration necessary.  3.  Hypokalemia, resolving, probably secondary to secondary      hyperaldosteronism related to increased renal excretion, potassium      secondary to vomiting and poor p.o. intake.  She is now replete.      Sherril Croon, M.D.  Electronically Signed     MWW/MEDQ  D:  03/07/2005  T:  03/08/2005  Job:  [id number]

## 2010-07-02 NOTE — H&P (Signed)
NAMEMARGARITE, VESSEL                  ACCOUNT NO.:  [id number]   MEDICAL RECORD NO.:  [id number]          PATIENT TYPE:  INP   LOCATION:  1827                         FACILITY:  [facility]   PHYSICIAN:  Dorothea Glassman, M.D.    DATE OF BIRTH:  [birth year]   DATE OF ADMISSION:  03/04/2005  DATE OF DISCHARGE:                                HISTORY & PHYSICAL   ADMIT DIAGNOSIS:  Nausea, vomiting.   HISTORY OF PRESENT ILLNESS:  This is a 50 year old Caucasian female status  post aortobifemoral bypass graft on February 21, 2005, by Dr. Amedeo Plenty. Her  operation was uneventful. In the postoperative course she developed a small  ileus with several episodes of nausea and vomiting. Her diet was reduced to  sips and ice chips and over the next several days her symptoms resolved and  her diet was increased to a full diet. She had also developed diarrhea  during the postoperative course with a negative C. difficile. This also  resolved. The patient was discharged home in stable condition on March 01, 2005. Since then the patient has had no further diarrhea but the nausea and  vomiting has persisted. She has had no difficulty with urination and no  abdominal pain. She has not had a bowel movement x3 days. The patient  presented to the ER today secondary to the persistent nausea and vomiting.  She also experienced frequent hiccups. The patient states that her emesis is  always green and bile-like. She is status post cholecystectomy in the  past. She complains of reflux-type symptoms in addition to her  nausea/vomiting. She denies abdominal pain, constipation, hematochezia,  hematemesis, back pain, claudication symptoms, dysuria, hematuria, and  arrhythmias.   PAST MEDICAL HISTORY:  1.  Aortoiliac occlusive disease.  2.  Hypertension.  3.  Hyperlipidemia.  4.  Tobacco abuse.  5.  History of mitral valve prolapse.  6.  Grave's disease status post radioactive iodine treatment with resulting  hypothyroidism.  7.  Status post tubal ligation.  8.  Status post cholecystectomy.  9.  Status post aortobifemoral bypass graft.   ALLERGIES:  She states she has intolerance to FLU SHOT.   MEDICATIONS:  1.  Levoxyl 150 mcg p.o. daily.  2.  Toprol-XL 100 mg daily.  3.  Vytorin 10/20 mg daily.  4.  Alprazolam 0.5 mg one-half tablet to one tablet p.r.n. anxiety.   SOCIAL HISTORY:  This is a married female with two children who lives with  her family. She continues to smoke one pack per day and has for 20 years,  although she is currently trying to quit. Her alcohol intake is very  infrequent. She is currently employed and does continue to drive.   PHYSICAL EXAMINATION:  VITAL SIGNS:  Blood pressure 135/81 right arm lying  down, heart rate 91, respirations 20, temperature 96.9.  GENERAL:  This is a 50 year old Caucasian female in no acute distress.  HEENT:  Normocephalic, atraumatic. Pupils equal, round, and reactive to  light and accommodation. Extraocular movements are intact. The oral mucosa  is pink and dry. Sclerae are  nonicteric.  NECK:  Supple with no JVD, no bruits, and no lymphadenopathy. The carotids  are palpable.  RESPIRATIONS:  Symmetric, unlabored, and clear.  CARDIAC:  Regular rate and rhythm.  ABDOMEN:  Soft, nontender, nondistended, with hypoactive bowel sounds. There  is a midline abdominal scar that is healing well with staples intact. There  is no evidence of infection.  GENITOURINARY AND RECTAL:  Deferred.  EXTREMITIES:  There is no edema, varicosities, or venous stasis changes.  Temperature is warm.  PULSES:  Femoral are 2+ and dorsalis pedis pulses are 2+ bilaterally.  NEUROLOGIC:  Nonfocal. The patient is alert and oriented x3.   ASSESSMENT:  Persistent nausea and vomiting status post aortobifemoral  bypass graft with possible ileus.   PLAN:  1.  Admit.  2.  IV fluids.  3.  Clear liquids only.  4.  Abdominal series.  5.  GI labs.  6.  Reglan p.r.n.  nausea and vomiting.      Leta Baptist, PA      P. Drucie Opitz, M.D.  Electronically Signed    AY/MEDQ  D:  03/04/2005  T:  03/04/2005  Job:  [id number]

## 2010-07-02 NOTE — Discharge Summary (Signed)
[name], [name]                  ACCOUNT NO.:  [id number]   MEDICAL RECORD NO.:  [id number]          PATIENT TYPE:  INP   LOCATION:  2035                         FACILITY:  [facility]   PHYSICIAN:  Dorothea Glassman, M.D.    DATE OF BIRTH:  [birth year]   DATE OF ADMISSION:  03/04/2005  DATE OF DISCHARGE:  03/08/2005                                 DISCHARGE SUMMARY   ADMISSION DIAGNOSES:  Nausea and vomiting.   PAST MEDICAL HISTORY AND DISCHARGE DIAGNOSES:  1.  Aortoiliac occlusive disease, status post aortobifemoral bypass graft.  2.  Hypertension.  3.  Hyperlipidemia.  4.  Tobacco abuse.  5.  History of mitral valve prolapse.  6.  Graves' disease, status post radioactive iodine treatment with resulting      hypothyroidism.  7.  Status post tubal ligation.  8.  Status post cholecystectomy.  9.  Acute renal failure secondary to ischemic acute tubular necrosis versus      cholesterol emboli syndrome, currently stable.   ALLERGIES:  Intolerance to FLU SHOT.   BRIEF HISTORY:  The patient is a 50 year old Caucasian female, status post  aortobifemoral bypass graft on February 21, 2005, by Dr. Amedeo Plenty. Her operation  was uneventful and her postoperative course was complicated by a small ileus  with several episodes of nausea and vomiting. It was also complicated by  diarrhea that eventually resolved and was found to have a negative  Clostridium difficile. She was discharged home in stable condition, however  the nausea and vomiting persisted. The patient was then re-admitted on  March 04, 2005, via the emergency room at [facility] secondary to  persistent nausea and vomiting. She denied all other constitutional symptoms  including diarrhea, abdominal pain, back pain, hematochezia, and  hematemesis.   HOSPITAL COURSE:  The patient was admitted via the emergency room on March 04, 2005, with persistent nausea and vomiting as previously stated.  The  patient was started on IV  fluids. She was also found to be hypokalemic and  this was replaced via the IV. She was kept on clear liquids only. An  abdominal series of films was taken and this was found to be within normal  limits and no ileus was present. She was also given Reglan p.r.n. for nausea  and vomiting. Initially the patient did have 750 mL of greenish bowel emesis  shortly after admission. She was given her IV medications and the nausea and  vomiting resolved. At the time an NG tube was ordered, however the patient  refused to have this placed. She also had no urine output on admission and a  Foley catheter was placed. Urine was returned.   The patient's nausea and vomiting continued to improve and she was slowly  restarted on a diet of clear liquids. She tolerated this well and this was  subsequently increased back to a full diet. At the time she is tolerating  this well. Initial labs on admission were taken and the patient was noted to  have an elevated creatinine of 3.3 with a BUN of 21. Secondary  to these  findings, her labs were closely followed over the next several days. This  continued to trend up to a peak value of 3.4 for her creatinine and 24 for  her BUN. At that time the renal service was consulted.   The renal service evaluated the patient on March 07, 2005, and it was  their opinion that the patient was in acute renal failure secondary to  either ischemic acute tubular necrosis versus cholesterol emboli syndrome.  This was again followed closely and the patient underwent a renal ultrasound  on March 08, 2005, to rule out hydronephrosis. This ultrasound was found  to be negative. At this time her creatinine is currently trending down and  it is the renal service opinion that the patient should be stable for  discharge home with follow-up labs two days after discharge.   On March 08, 2005, the patient was afebrile with stable vital signs and  has good urine output. She is alert and  states that she feels better. Her  lungs are clear. Cardiac reveals a regular rate and rhythm. Her abdomen is  soft with hyperactive bowel sounds. The incision is healing well and there  is no edema present in the extremities. The patient is currently in stable  condition at this time, and as long as she continues to progress in the  current manner she will be ready for discharge this afternoon with follow-up  labs as an outpatient.   LABORATORY DATA:  BNP on March 08, 2005, revealed sodium of 133, potassium  4, BUN 16, creatinine 3.0, glucose 107. CBC on March 06, 2005, revealed  hemoglobin of 11.4, hematocrit 33.2, white count 10.7, platelet count  325,000. Creatinine on March 07, 2005, was 73.3 with random urine sodium  on January 22nd of 100.   DISCHARGE INSTRUCTIONS:  1.  Diet--low salt, low fat.  2.  Activity--the patient may shower and bathe, and should increase her      activity slowly with continuation of daily breathing and walking      exercises.  3.  Wound care--the patient should clean her incision daily with soap and      water. If wound problems arise [facility] office should be contacted.   MEDICATIONS:  1.  Tylox one to two q.4-6h. p.r.n. pain.  2.  Levothyroxine 150 mg daily.  3.  Vytorin 10-20 mg daily.  4.  Toprol XL 100 mg daily.  5.  Protonix 40 mg daily.   FOLLOWUP APPOINTMENT:  1.  Dr. Amedeo Plenty on March 24, 2005.  2.  The patient has a staple removal appointment at the [facility] office that was      previously established. The patient should continue to keep this      appointment.  3.  She will have a BMET drawn on Thursday, March 10, 2005, at [facility].  4.  The patient will be instructed to contact Dr. Jason Nest office to schedule      a follow-up appointment at his discretion.      Leta Baptist, PA      P. Drucie Opitz, M.D.  Electronically Signed   AY/MEDQ  D:  03/08/2005  T:  03/08/2005  Job:  [id number]   cc:   Sherril Croon,  M.D.  Fax: [phone]

## 2010-09-07 ENCOUNTER — Other Ambulatory Visit: Payer: Self-pay | Admitting: Internal Medicine

## 2010-09-13 ENCOUNTER — Other Ambulatory Visit: Payer: Self-pay | Admitting: Internal Medicine

## 2010-09-27 ENCOUNTER — Other Ambulatory Visit: Payer: Self-pay | Admitting: Internal Medicine

## 2010-12-08 ENCOUNTER — Other Ambulatory Visit: Payer: Self-pay | Admitting: Internal Medicine

## 2011-03-07 ENCOUNTER — Other Ambulatory Visit: Payer: Self-pay

## 2011-03-07 MED ORDER — FLUTICASONE PROPIONATE 50 MCG/ACT NA SUSP: 2.0000 | Freq: Every day | NASAL | Status: DC

## 2011-07-07 ENCOUNTER — Other Ambulatory Visit (INDEPENDENT_AMBULATORY_CARE_PROVIDER_SITE_OTHER): Payer: BC Managed Care – PPO

## 2011-07-07 DIAGNOSIS — Z0000 Encounter for general adult medical examination without abnormal findings: Secondary | ICD-10-CM

## 2011-07-07 LAB — BASIC METABOLIC PANEL
CO2: 22 mEq/L (ref 19–32)
Calcium: 9.4 mg/dL (ref 8.4–10.5)
GFR: 65.06 mL/min (ref >60.00)
Glucose, Bld: 103 mg/dL — ABNORMAL HIGH (ref 70–99)
Potassium: 4.2 mEq/L (ref 3.5–5.1)
Sodium: 137 mEq/L (ref 135–145)

## 2011-07-07 LAB — POCT URINALYSIS DIPSTICK
Bilirubin, UA: NEGATIVE
Blood, UA: NEGATIVE
Glucose, UA: NEGATIVE
Nitrite, UA: NEGATIVE
Protein, UA: NEGATIVE
Spec Grav, UA: 1.025
Urobilinogen, UA: 0.2
pH, UA: 5

## 2011-07-07 LAB — LIPID PANEL
Cholesterol: 134 mg/dL (ref 0–200)
HDL: 33.3 mg/dL — ABNORMAL LOW (ref >39.00)
LDL Cholesterol: 81 mg/dL (ref 0–99)
Total CHOL/HDL Ratio: 4
Triglycerides: 99 mg/dL (ref 0.0–149.0)
VLDL: 19.8 mg/dL (ref 0.0–40.0)

## 2011-07-07 LAB — CBC WITH DIFFERENTIAL/PLATELET
Eosinophils Absolute: 0.2 10*3/uL (ref 0.0–0.7)
Eosinophils Relative: 2.5 % (ref 0.0–5.0)
HCT: 42 % (ref 36.0–46.0)
Hemoglobin: 13.9 g/dL (ref 12.0–15.0)
Lymphocytes Relative: 27.1 % (ref 12.0–46.0)
Lymphs Abs: 1.9 10*3/uL (ref 0.7–4.0)
MCV: 92.6 fl (ref 78.0–100.0)
Monocytes Relative: 6.9 % (ref 3.0–12.0)
Neutro Abs: 4.3 10*3/uL (ref 1.4–7.7)
RBC: 4.53 Mil/uL (ref 3.87–5.11)
RDW: 12.7 % (ref 11.5–14.6)
WBC: 7 10*3/uL (ref 4.5–10.5)

## 2011-07-07 LAB — HEPATIC FUNCTION PANEL
ALT: 53 U/L — ABNORMAL HIGH (ref 0–35)
AST: 43 U/L — ABNORMAL HIGH (ref 0–37)
Albumin: 4 g/dL (ref 3.5–5.2)
Alkaline Phosphatase: 107 U/L (ref 39–117)
Bilirubin, Direct: 0 mg/dL (ref 0.0–0.3)
Total Bilirubin: 0.4 mg/dL (ref 0.3–1.2)

## 2011-07-07 LAB — TSH: TSH: 2.01 u[IU]/mL (ref 0.35–5.50)

## 2011-07-08 ENCOUNTER — Other Ambulatory Visit: Payer: 59

## 2011-07-15 ENCOUNTER — Encounter: Payer: Self-pay | Admitting: Internal Medicine

## 2011-07-15 ENCOUNTER — Ambulatory Visit (INDEPENDENT_AMBULATORY_CARE_PROVIDER_SITE_OTHER): Payer: BC Managed Care – PPO | Admitting: Internal Medicine

## 2011-07-15 VITALS — BP 110/70 | HR 64 | Temp 98.2°F | Resp 18 | Ht 65.0 in | Wt 200.0 lb

## 2011-07-15 DIAGNOSIS — E785 Hyperlipidemia, unspecified: Secondary | ICD-10-CM

## 2011-07-15 DIAGNOSIS — I739 Peripheral vascular disease, unspecified: Secondary | ICD-10-CM | POA: Insufficient documentation

## 2011-07-15 DIAGNOSIS — Z0000 Encounter for general adult medical examination without abnormal findings: Secondary | ICD-10-CM

## 2011-07-15 DIAGNOSIS — I10 Essential (primary) hypertension: Secondary | ICD-10-CM

## 2011-07-15 DIAGNOSIS — E039 Hypothyroidism, unspecified: Secondary | ICD-10-CM

## 2011-07-15 MED ORDER — METOPROLOL TARTRATE 50 MG PO TABS: 50.0000 mg | ORAL_TABLET | Freq: Two times a day (BID) | ORAL | Status: DC

## 2011-07-15 MED ORDER — LEVOTHYROXINE SODIUM 100 MCG PO TABS: 100.0000 ug | ORAL_TABLET | Freq: Every day | ORAL | Status: DC

## 2011-07-15 MED ORDER — VENLAFAXINE HCL 50 MG PO TABS: 50.0000 mg | ORAL_TABLET | Freq: Two times a day (BID) | ORAL | Status: DC

## 2011-07-15 MED ORDER — PRAVASTATIN SODIUM 40 MG PO TABS: 40.0000 mg | ORAL_TABLET | Freq: Every day | ORAL | Status: DC

## 2011-07-15 MED ORDER — FLUTICASONE PROPIONATE 50 MCG/ACT NA SUSP: 2.0000 | Freq: Every day | NASAL | Status: DC

## 2011-07-15 MED ORDER — BENAZEPRIL HCL 20 MG PO TABS: 10.0000 mg | ORAL_TABLET | Freq: Every day | ORAL | Status: DC

## 2011-07-15 NOTE — Progress Notes (Signed)
Subjective:    Patient ID: [name], female    DOB: 1960/09/08, 51 y.o.   MRN: [id number]  HPI  51 year old patient who is seen today for a preventive health examination she has a history of peripheral vascular disease status post aortobifemoral bypass in January of 2007. She has done quite well. She denies any claudication or focal neurological symptoms. Denies any exertional chest pain. Remains quite active. She discontinued smoking in 2006 at the time of her diagnosis of peripheral vascular disease. She has treated hypertension and dyslipidemia She has a history of hypothyroidism secondary to I-131 treatment for Graves' disease Additional problems include a history of depression as well as allergic rhinitis.   Wt Readings from Last 3 Encounters:  07/15/11 200 lb (90.719 kg)  07/02/10 205 lb (92.987 kg)  08/24/09 200 lb (90.719 kg)   Past Medical History  Diagnosis Date  . ALLERGIC RHINITIS 04/10/2008  . DEPRESSION 08/13/2008  . HYPERLIPIDEMIA 08/04/2006  . HYPERTENSION 08/04/2006  . HYPOTHYROIDISM 08/04/2006  . LOW BACK PAIN 08/04/2006  . PERIMENOPAUSAL SYNDROME 05/08/2008  . PVD (peripheral vascular disease)     occlusive, status post bifem bypass 2007    History   Social History  . Marital Status: Married    Spouse Name: N/A    Number of Children: N/A  . Years of Education: N/A   Occupational History  . Not on file.   Social History Main Topics  . Smoking status: Former Smoker    Quit date: 02/15/2004  . Smokeless tobacco: Never Used  . Alcohol Use: No  . Drug Use: No  . Sexually Active: Not on file   Other Topics Concern  . Not on file   Social History Narrative  . No narrative on file    Past Surgical History  Procedure Date  . Cholecystectomy   . Femoral bypass     bifem.    Family History  Problem Relation Age of Onset  . Hypertension Mother   . Heart disease Father   . Diabetes Maternal Grandmother     Allergies  Allergen Reactions  .  Influenza Virus Vacc Split Pf     Current Outpatient Prescriptions on File Prior to Visit  Medication Sig Dispense Refill  . benazepril (LOTENSIN) 20 MG tablet TAKE ONE TABLET BY MOUTH ONE TIME DAILY  90 tablet  3  . fexofenadine (ALLEGRA) 180 MG tablet Take 180 mg by mouth daily.        . fluticasone (FLONASE) 50 MCG/ACT nasal spray Place 2 sprays into the nose daily.  16 g  3  . levothyroxine (SYNTHROID, LEVOTHROID) 100 MCG tablet TAKE ONE TABLET BY MOUTH ONE TIME DAILY  90 tablet  3  . metoprolol (LOPRESSOR) 50 MG tablet TAKE ONE TABLET BY MOUTH TWICE DAILY  180 tablet  2  . pravastatin (PRAVACHOL) 40 MG tablet TAKE ONE TABLET BY MOUTH ONE TIME DAILY  90 tablet  2  . venlafaxine (EFFEXOR) 50 MG tablet TAKE ONE TABLET BY MOUTH EVERY MORNING  90 tablet  3    BP 110/70  Pulse 64  Temp(Src) 98.2 F (36.8 C) (Oral)  Resp 18  Ht 5' 5"  (1.651 m)  Wt 200 lb (90.719 kg)  BMI 33.28 kg/m2  SpO2 99%     Review of Systems  Constitutional: Negative for fever, appetite change, fatigue and unexpected weight change.  HENT: Negative for hearing loss, ear pain, nosebleeds, congestion, sore throat, mouth sores, trouble swallowing, neck stiffness, dental problem, voice  change, sinus pressure and tinnitus.   Eyes: Negative for photophobia, pain, redness and visual disturbance.  Respiratory: Negative for cough, chest tightness and shortness of breath.   Cardiovascular: Negative for chest pain, palpitations and leg swelling.  Gastrointestinal: Negative for nausea, vomiting, abdominal pain, diarrhea, constipation, blood in stool, abdominal distention and rectal pain.  Genitourinary: Negative for dysuria, urgency, frequency, hematuria, flank pain, vaginal bleeding, vaginal discharge, difficulty urinating, genital sores, vaginal pain, menstrual problem and pelvic pain.  Musculoskeletal: Negative for back pain and arthralgias.  Skin: Negative for rash.  Neurological: Negative for dizziness, syncope,  speech difficulty, weakness, light-headedness, numbness and headaches.  Hematological: Negative for adenopathy. Does not bruise/bleed easily.  Psychiatric/Behavioral: Negative for suicidal ideas, behavioral problems, self-injury, dysphoric mood and agitation. The patient is not nervous/anxious.        Objective:   Physical Exam  Constitutional: She is oriented to person, place, and time. She appears well-developed and well-nourished.       Blood pressure 120/80 bilaterally  HENT:  Head: Normocephalic and atraumatic.  Right Ear: External ear normal.  Left Ear: External ear normal.  Mouth/Throat: Oropharynx is clear and moist.  Eyes: Conjunctivae and EOM are normal.  Neck: Normal range of motion. Neck supple. No JVD present. No thyromegaly present.       No carotid or supraclavicular bruits  Cardiovascular: Normal rate, regular rhythm, normal heart sounds and intact distal pulses.   No murmur heard.      Posterior tibial pulses are faint. Dorsalis pedis pulses are full  Pulmonary/Chest: Effort normal and breath sounds normal. She has no wheezes. She has no rales.  Abdominal: Soft. Bowel sounds are normal. She exhibits no distension and no mass. There is no tenderness. There is no rebound and no guarding.       Midline abdominal scar. No bowel or femoral bruits  Musculoskeletal: Normal range of motion. She exhibits no edema and no tenderness.  Neurological: She is alert and oriented to person, place, and time. She has normal reflexes. No cranial nerve deficit. She exhibits normal muscle tone. Coordination normal.  Skin: Skin is warm and dry. No rash noted.  Psychiatric: She has a normal mood and affect. Her behavior is normal.          Assessment & Plan:   Preventive health examination Peripheral vascular disease. We'll continue aggressive risk factor modification Hypertension stable Exogenous obesity weight loss exercise encouraged Dyslipidemia stable   Recheck one year

## 2011-07-15 NOTE — Patient Instructions (Signed)
Limit your sodium (Salt) intake    It is important that you exercise regularly, at least 20 minutes 3 to 4 times per week.  If you develop chest pain or shortness of breath seek  medical attention.  You need to lose weight.  Consider a lower calorie diet and regular exercise.DASH Diet The DASH diet stands for "Dietary Approaches to Stop Hypertension." It is a healthy eating plan that has been shown to reduce high blood pressure (hypertension) in as little as 14 days, while also possibly providing other significant health benefits. These other health benefits include reducing the risk of breast cancer after menopause and reducing the risk of type 2 diabetes, heart disease, colon cancer, and stroke. Health benefits also include weight loss and slowing kidney failure in patients with chronic kidney disease.   DIET GUIDELINES  Limit salt (sodium). Your diet should contain less than 1500 mg of sodium daily.   Limit refined or processed carbohydrates. Your diet should include mostly whole grains. Desserts and added sugars should be used sparingly.   Include small amounts of heart-healthy fats. These types of fats include nuts, oils, and tub margarine. Limit saturated and trans fats. These fats have been shown to be harmful in the body.  CHOOSING FOODS   The following food groups are based on a 2000 calorie diet. See your Registered Dietitian for individual calorie needs. Grains and Grain Products (6 to 8 servings daily)  Eat More Often: Whole-wheat bread, brown rice, whole-grain or wheat pasta, quinoa, popcorn without added fat or salt (air popped).   Eat Less Often: White bread, white pasta, white rice, cornbread.  Vegetables (4 to 5 servings daily)  Eat More Often: Fresh, frozen, and canned vegetables. Vegetables may be raw, steamed, roasted, or grilled with a minimal amount of fat.   Eat Less Often/Avoid: Creamed or fried vegetables. Vegetables in a cheese sauce.  Fruit (4 to 5 servings  daily)  Eat More Often: All fresh, canned (in natural juice), or frozen fruits. Dried fruits without added sugar. One hundred percent fruit juice ( cup [237 mL] daily).   Eat Less Often: Dried fruits with added sugar. Canned fruit in light or heavy syrup.  [employer], Fish, and Poultry (2 servings or less daily. One serving is 3 to 4 oz [85-114 g]).  Eat More Often: Ninety percent or leaner ground beef, tenderloin, sirloin. Round cuts of beef, chicken breast, [place] breast. All fish. Grill, bake, or broil your meat. Nothing should be fried.   Eat Less Often/Avoid: Fatty cuts of meat, [place], or chicken leg, thigh, or wing. Fried cuts of meat or fish.  Dairy (2 to 3 servings)  Eat More Often: Low-fat or fat-free milk, low-fat plain or light yogurt, reduced-fat or part-skim cheese.   Eat Less Often/Avoid: Milk (whole, 2%, skim, or chocolate). Whole milk yogurt. Full-fat cheeses.  Nuts, Seeds, and Legumes (4 to 5 servings per week)  Eat More Often: All without added salt.   Eat Less Often/Avoid: Salted nuts and seeds, canned beans with added salt.  Fats and Sweets (limited)  Eat More Often: Vegetable oils, tub margarines without trans fats, sugar-free gelatin. Mayonnaise and salad dressings.   Eat Less Often/Avoid: Coconut oils, palm oils, butter, stick margarine, cream, half and half, cookies, candy, pie.  FOR MORE INFORMATION The Dash Diet Eating Plan: www.dashdiet.org Document Released: 01/20/2011 Document Reviewed: 01/10/2011 [facility] Patient Information 2012 [place].

## 2012-01-04 ENCOUNTER — Ambulatory Visit (INDEPENDENT_AMBULATORY_CARE_PROVIDER_SITE_OTHER): Payer: BC Managed Care – PPO | Admitting: Family Medicine

## 2012-01-04 ENCOUNTER — Encounter: Payer: Self-pay | Admitting: Family Medicine

## 2012-01-04 VITALS — BP 116/70 | HR 79 | Temp 98.7°F | Wt 203.0 lb

## 2012-01-04 DIAGNOSIS — L089 Local infection of the skin and subcutaneous tissue, unspecified: Secondary | ICD-10-CM

## 2012-01-04 DIAGNOSIS — IMO0002 Reserved for concepts with insufficient information to code with codable children: Secondary | ICD-10-CM

## 2012-01-04 MED ORDER — CEPHALEXIN 500 MG PO CAPS: 500.0000 mg | ORAL_CAPSULE | Freq: Three times a day (TID) | ORAL | Status: AC

## 2012-01-04 NOTE — Progress Notes (Signed)
  Subjective:    Patient ID: [name], female    DOB: April 07, 1960, 51 y.o.   MRN: [id number]  HPI Here for an irritated wound on the right shin from 12-28-11. She struck her leg on the edge of a stone hearth at her fireplace. She has been cleaning it with peroxide and dressing it with Neosporin daily. The wound is still red and tender.   Review of Systems  Constitutional: Negative.   Skin: Positive for wound.       Objective:   Physical Exam  Constitutional: She appears well-developed and well-nourished.  Skin:       Small abrasion in the right lower leg with a central eschar and a zone of erythema around it. No warmth or edema           Assessment & Plan:  Treat with Keflex. Stop the peroxide and cleanse with soap and water daily.

## 2012-02-22 ENCOUNTER — Ambulatory Visit (INDEPENDENT_AMBULATORY_CARE_PROVIDER_SITE_OTHER): Payer: BC Managed Care – PPO | Admitting: Internal Medicine

## 2012-02-22 ENCOUNTER — Encounter: Payer: Self-pay | Admitting: Internal Medicine

## 2012-02-22 VITALS — BP 120/80 | HR 62 | Temp 98.4°F | Resp 18 | Wt 208.0 lb

## 2012-02-22 DIAGNOSIS — J309 Allergic rhinitis, unspecified: Secondary | ICD-10-CM

## 2012-02-22 DIAGNOSIS — I10 Essential (primary) hypertension: Secondary | ICD-10-CM

## 2012-02-22 DIAGNOSIS — J019 Acute sinusitis, unspecified: Secondary | ICD-10-CM

## 2012-02-22 MED ORDER — AMOXICILLIN-POT CLAVULANATE 875-125 MG PO TABS: 1.0000 | ORAL_TABLET | Freq: Two times a day (BID) | ORAL | Status: DC

## 2012-02-22 NOTE — Patient Instructions (Signed)
    Use saline irrigation, warm  moist compresses and over-the-counter decongestants only as directed.  Call if there is no improvement in 5 to 7 days, or sooner if you develop increasing pain, fever, or any new symptoms.  Take your antibiotic as prescribed until ALL of it is gone, but stop if you develop a rash, swelling, or any side effects of the medication.  Contact our office as soon as possible if  there are side effects of the medication.

## 2012-02-22 NOTE — Progress Notes (Signed)
Subjective:    Patient ID: [name], female    DOB: 02-Jun-1960, 52 y.o.   MRN: [id number]  HPI  52 year old patient who has a history of treated hypertension and allergic rhinitis. She presents with a several-day history of worsening left maxillary sinus pain with bloody drainage from the left nares. She has developed a sense of chilliness and achiness. No documented fever. She generally feels unwell. She remains on Allegra and fluticasone nasal spray chronically. She has treated hypertension which has been stable and  Past Medical History  Diagnosis Date  . ALLERGIC RHINITIS 04/10/2008  . DEPRESSION 08/13/2008  . HYPERLIPIDEMIA 08/04/2006  . HYPERTENSION 08/04/2006  . HYPOTHYROIDISM 08/04/2006  . LOW BACK PAIN 08/04/2006  . PERIMENOPAUSAL SYNDROME 05/08/2008  . PVD (peripheral vascular disease)     occlusive, status post bifem bypass 2007    History   Social History  . Marital Status: Married    Spouse Name: N/A    Number of Children: N/A  . Years of Education: N/A   Occupational History  . Not on file.   Social History Main Topics  . Smoking status: Former Smoker    Quit date: 02/15/2004  . Smokeless tobacco: Never Used  . Alcohol Use: No  . Drug Use: No  . Sexually Active: Not on file   Other Topics Concern  . Not on file   Social History Narrative  . No narrative on file    Past Surgical History  Procedure Date  . Cholecystectomy   . Femoral bypass     bifem.    Family History  Problem Relation Age of Onset  . Hypertension Mother   . Heart disease Father   . Diabetes Maternal Grandmother     Allergies  Allergen Reactions  . Influenza Virus Vacc Split Pf     Current Outpatient Prescriptions on File Prior to Visit  Medication Sig Dispense Refill  . benazepril (LOTENSIN) 20 MG tablet Take 0.5 tablets (10 mg total) by mouth daily.  90 tablet  3  . fexofenadine (ALLEGRA) 180 MG tablet Take 180 mg by mouth daily.        . fluticasone (FLONASE) 50 MCG/ACT  nasal spray Place 2 sprays into the nose daily.  16 g  3  . levothyroxine (SYNTHROID, LEVOTHROID) 100 MCG tablet Take 1 tablet (100 mcg total) by mouth daily.  90 tablet  3  . metoprolol (LOPRESSOR) 50 MG tablet Take 1 tablet (50 mg total) by mouth 2 (two) times daily.  180 tablet  2  . pravastatin (PRAVACHOL) 40 MG tablet Take 1 tablet (40 mg total) by mouth daily.  90 tablet  2  . venlafaxine (EFFEXOR) 50 MG tablet Take 1 tablet (50 mg total) by mouth 2 (two) times daily.  90 tablet  3    BP 120/80  Pulse 62  Temp 98.4 F (36.9 C) (Oral)  Resp 18  Wt 208 lb (94.348 kg)  SpO2 97%       Review of Systems  Constitutional: Positive for chills and fatigue.  HENT: Positive for congestion, rhinorrhea, postnasal drip and sinus pressure. Negative for hearing loss, sore throat, dental problem and tinnitus.   Eyes: Negative for pain, discharge and visual disturbance.  Respiratory: Negative for cough and shortness of breath.   Cardiovascular: Negative for chest pain, palpitations and leg swelling.  Gastrointestinal: Negative for nausea, vomiting, abdominal pain, diarrhea, constipation, blood in stool and abdominal distention.  Genitourinary: Negative for dysuria, urgency, frequency, hematuria, flank pain,  vaginal bleeding, vaginal discharge, difficulty urinating, vaginal pain and pelvic pain.  Musculoskeletal: Negative for joint swelling, arthralgias and gait problem.  Skin: Negative for rash.  Neurological: Positive for headaches. Negative for dizziness, syncope, speech difficulty, weakness and numbness.  Hematological: Negative for adenopathy.  Psychiatric/Behavioral: Negative for behavioral problems, dysphoric mood and agitation. The patient is not nervous/anxious.        Objective:   Physical Exam  Constitutional: She is oriented to person, place, and time. She appears well-developed and well-nourished.  HENT:  Head: Normocephalic.  Right Ear: External ear normal.  Left Ear:  External ear normal.  Mouth/Throat: Oropharynx is clear and moist.       Left maxillary sinus tenderness  Eyes: Conjunctivae normal and EOM are normal. Pupils are equal, round, and reactive to light.  Neck: Normal range of motion. Neck supple. No thyromegaly present.  Cardiovascular: Normal rate, regular rhythm, normal heart sounds and intact distal pulses.   Pulmonary/Chest: Effort normal and breath sounds normal.  Abdominal: Soft. Bowel sounds are normal. She exhibits no mass. There is no tenderness.  Musculoskeletal: Normal range of motion.  Lymphadenopathy:    She has no cervical adenopathy.  Neurological: She is alert and oriented to person, place, and time.  Skin: Skin is warm and dry. No rash noted.  Psychiatric: She has a normal mood and affect. Her behavior is normal.          Assessment & Plan:   Acute left maxillary sinusitis. Will treat with Augmentin continue nasal irrigation decongestants hypertension stable

## 2012-03-23 ENCOUNTER — Encounter: Payer: Self-pay | Admitting: Family Medicine

## 2012-03-23 ENCOUNTER — Ambulatory Visit (INDEPENDENT_AMBULATORY_CARE_PROVIDER_SITE_OTHER): Payer: BC Managed Care – PPO | Admitting: Family Medicine

## 2012-03-23 VITALS — BP 124/82 | HR 79 | Temp 98.4°F | Wt 204.0 lb

## 2012-03-23 DIAGNOSIS — J329 Chronic sinusitis, unspecified: Secondary | ICD-10-CM

## 2012-03-23 MED ORDER — LEVOFLOXACIN 500 MG PO TABS: 500.0000 mg | ORAL_TABLET | Freq: Every day | ORAL | Status: AC

## 2012-03-23 NOTE — Progress Notes (Signed)
  Subjective:    Patient ID: [name], female    DOB: [birth year], 52 y.o.   MRN: [id number]  HPI Here for 5 days of fevers to 102 degrees, coughing up green sputum and blowing bloody mucus from the nose, HA, and sinus pressure. No ST or body aches. No NVD. She was here one month ago for a sinus infection and was given Augmentin. She felt better but never totally well after that. Using Mucinex DM.    Review of Systems  Constitutional: Positive for fever.  HENT: Positive for ear pain, congestion, postnasal drip and sinus pressure.   Eyes: Negative.   Respiratory: Positive for cough.        Objective:   Physical Exam  Constitutional: She appears well-developed and well-nourished.  HENT:  Right Ear: External ear normal.  Left Ear: External ear normal.  Nose: Nose normal.  Mouth/Throat: Oropharynx is clear and moist.  Eyes: Conjunctivae normal are normal.  Pulmonary/Chest: Effort normal and breath sounds normal.  Lymphadenopathy:    She has no cervical adenopathy.          Assessment & Plan:  Recurrent sinusitis. Given 14 days of Levaquin.

## 2012-03-27 ENCOUNTER — Ambulatory Visit (INDEPENDENT_AMBULATORY_CARE_PROVIDER_SITE_OTHER): Payer: BC Managed Care – PPO | Admitting: Family Medicine

## 2012-03-27 ENCOUNTER — Encounter: Payer: Self-pay | Admitting: Family Medicine

## 2012-03-27 VITALS — BP 120/78 | HR 116 | Temp 98.1°F | Wt 200.0 lb

## 2012-03-27 DIAGNOSIS — A084 Viral intestinal infection, unspecified: Secondary | ICD-10-CM

## 2012-03-27 DIAGNOSIS — J019 Acute sinusitis, unspecified: Secondary | ICD-10-CM

## 2012-03-27 DIAGNOSIS — A088 Other specified intestinal infections: Secondary | ICD-10-CM

## 2012-03-27 NOTE — Progress Notes (Signed)
  Subjective:    Patient ID: [name], female    DOB: 08-Jun-1960, 52 y.o.   MRN: [id number]  HPI Here for 2 days of mild stomach cramps, nausea and diarrhea. She vomited a few times yesterday but not today. No fever. Drinking fluids. She is halfway through a course of Levaquin for a sinus infection, and these symptoms are much better. Her husband and daughter are also having vomiting and diarrhea this week.    Review of Systems  Constitutional: Negative.   HENT: Negative.   Eyes: Negative.   Respiratory: Negative.   Gastrointestinal: Positive for nausea and diarrhea. Negative for vomiting, abdominal pain, constipation, blood in stool, abdominal distention and rectal pain.       Objective:   Physical Exam  Constitutional: She appears well-developed and well-nourished. No distress.  Cardiovascular: Normal rate, regular rhythm, normal heart sounds and intact distal pulses.   Pulmonary/Chest: Effort normal and breath sounds normal.  Abdominal: Soft. Bowel sounds are normal. She exhibits no distension and no mass. There is no tenderness. There is no rebound and no guarding.  Lymphadenopathy:    She has no cervical adenopathy.          Assessment & Plan:  She is improving as far as the sinusitis goes, but now she has a GI virus on top of that. She will try Imodium AD for the diarrhea. Advance the diet as tolerated. Finish out the [place].

## 2012-07-19 ENCOUNTER — Other Ambulatory Visit: Payer: Self-pay | Admitting: Internal Medicine

## 2012-08-07 ENCOUNTER — Other Ambulatory Visit: Payer: Self-pay | Admitting: Internal Medicine

## 2012-08-08 NOTE — Telephone Encounter (Signed)
PT called and stated that she is completely out of this medication. Please assist.

## 2012-09-04 ENCOUNTER — Other Ambulatory Visit: Payer: Self-pay | Admitting: Internal Medicine

## 2012-09-05 NOTE — Telephone Encounter (Signed)
Pt would like med sent into phar today. Pt is having dizziness due to lack of medication in system.

## 2012-10-09 ENCOUNTER — Encounter: Payer: Self-pay | Admitting: Internal Medicine

## 2012-10-09 ENCOUNTER — Ambulatory Visit (INDEPENDENT_AMBULATORY_CARE_PROVIDER_SITE_OTHER): Payer: Self-pay | Admitting: Internal Medicine

## 2012-10-09 VITALS — BP 122/80 | HR 64 | Temp 98.1°F | Resp 20 | Wt 205.0 lb

## 2012-10-09 DIAGNOSIS — I10 Essential (primary) hypertension: Secondary | ICD-10-CM

## 2012-10-09 DIAGNOSIS — J309 Allergic rhinitis, unspecified: Secondary | ICD-10-CM

## 2012-10-09 MED ORDER — PREDNISONE 20 MG PO TABS: 20.0000 mg | ORAL_TABLET | Freq: Two times a day (BID) | ORAL | Status: DC

## 2012-10-09 NOTE — Progress Notes (Signed)
Subjective:    Patient ID: [name], female    DOB: 1960/06/30, 52 y.o.   MRN: [id number]  HPI   52 year old patient who has a history of allergic rhinitis. For the past week she has had increasing sinus congestion drainage and sore throat. There's been increased rhinorrhea. No documented fever. She has been using fluticasone nasal spray as well as Allegra-D for the past several days.  Past Medical History  Diagnosis Date  . ALLERGIC RHINITIS 04/10/2008  . DEPRESSION 08/13/2008  . HYPERLIPIDEMIA 08/04/2006  . HYPERTENSION 08/04/2006  . HYPOTHYROIDISM 08/04/2006  . LOW BACK PAIN 08/04/2006  . PERIMENOPAUSAL SYNDROME 05/08/2008  . PVD (peripheral vascular disease)     occlusive, status post bifem bypass 2007    History   Social History  . Marital Status: Married    Spouse Name: N/A    Number of Children: N/A  . Years of Education: N/A   Occupational History  . Not on file.   Social History Main Topics  . Smoking status: Former Smoker    Quit date: 02/15/2004  . Smokeless tobacco: Never Used  . Alcohol Use: No  . Drug Use: No  . Sexual Activity: Not on file   Other Topics Concern  . Not on file   Social History Narrative  . No narrative on file    Past Surgical History  Procedure Laterality Date  . Cholecystectomy    . Femoral bypass      bifem.    Family History  Problem Relation Age of Onset  . Hypertension Mother   . Heart disease Father   . Diabetes Maternal Grandmother     Allergies  Allergen Reactions  . Influenza Virus Vacc Split Pf     Current Outpatient Prescriptions on File Prior to Visit  Medication Sig Dispense Refill  . benazepril (LOTENSIN) 20 MG tablet Take 0.5 tablets (10 mg total) by mouth daily.  90 tablet  3  . fexofenadine (ALLEGRA) 180 MG tablet Take 180 mg by mouth daily.        . fluticasone (FLONASE) 50 MCG/ACT nasal spray USE TWO SPRAYS IN EACH NOSTRIL DAILY  16 g  1  . levothyroxine (SYNTHROID, LEVOTHROID) 100 MCG tablet TAKE  ONE TABLET BY MOUTH ONE TIME DAILY  90 tablet  0  . metoprolol (LOPRESSOR) 50 MG tablet TAKE ONE TABLET BY MOUTH TWICE DAILY  180 tablet  1  . pravastatin (PRAVACHOL) 40 MG tablet Take 1 tablet (40 mg total) by mouth daily.  90 tablet  2  . venlafaxine (EFFEXOR) 50 MG tablet TAKE ONE TABLET BY MOUTH TWICE DAILY  90 tablet  1   No current facility-administered medications on file prior to visit.    BP 122/80  Pulse 64  Temp(Src) 98.1 F (36.7 C) (Oral)  Resp 20  Wt 205 lb (92.987 kg)  BMI 34.11 kg/m2  SpO2 98%       Review of Systems  Constitutional: Negative.   HENT: Positive for ear pain, congestion, rhinorrhea and postnasal drip. Negative for hearing loss, sore throat, dental problem, sinus pressure and tinnitus.   Eyes: Negative for pain, discharge and visual disturbance.  Respiratory: Negative for cough and shortness of breath.   Cardiovascular: Negative for chest pain, palpitations and leg swelling.  Gastrointestinal: Negative for nausea, vomiting, abdominal pain, diarrhea, constipation, blood in stool and abdominal distention.  Genitourinary: Negative for dysuria, urgency, frequency, hematuria, flank pain, vaginal bleeding, vaginal discharge, difficulty urinating, vaginal pain and pelvic pain.  Musculoskeletal: Negative for joint swelling, arthralgias and gait problem.  Skin: Negative for rash.  Neurological: Negative for dizziness, syncope, speech difficulty, weakness, numbness and headaches.  Hematological: Negative for adenopathy.  Psychiatric/Behavioral: Negative for behavioral problems, dysphoric mood and agitation. The patient is not nervous/anxious.        Objective:   Physical Exam  Constitutional: She is oriented to person, place, and time. She appears well-developed and well-nourished.  HENT:  Head: Normocephalic.  Right Ear: External ear normal.  Left Ear: External ear normal.  Mild erythema of the oropharynx  Eyes: Conjunctivae and EOM are normal.  Pupils are equal, round, and reactive to light.  Neck: Normal range of motion. Neck supple. No thyromegaly present.  Cardiovascular: Normal rate, regular rhythm, normal heart sounds and intact distal pulses.   Pulmonary/Chest: Effort normal and breath sounds normal.  Abdominal: Soft. Bowel sounds are normal. She exhibits no mass. There is no tenderness.  Musculoskeletal: Normal range of motion.  Lymphadenopathy:    She has no cervical adenopathy.  Neurological: She is alert and oriented to person, place, and time.  Skin: Skin is warm and dry. No rash noted.  Psychiatric: She has a normal mood and affect. Her behavior is normal.          Assessment & Plan:   Allergic rhinitis flare. Will treat with the prednisone 20 mg twice a day for 5 days. Will continue antihistamines and fluticasone nasal spray. She will call if unimproved Hypertension controlled

## 2012-10-09 NOTE — Patient Instructions (Addendum)
Acute sinusitis symptoms for less than 10 days are generally not helped by antibiotic therapy.  Use saline irrigation, warm  moist compresses and over-the-counter decongestants only as directed.  Call if there is no improvement in 5 to 7 days, or sooner if you develop increasing pain, fever, or any new symptoms.

## 2012-10-16 ENCOUNTER — Telehealth: Payer: Self-pay | Admitting: Internal Medicine

## 2012-10-16 NOTE — Telephone Encounter (Signed)
Patient Information:  Caller Name: Florrie  Phone: [phone]  Patient: [name], [name]  Gender: Female  DOB: [birth year]  Age: 52 Years  PCP: Bluford Kaufmann (Family Practice > 65yr old)  Pregnant: No  Office Follow Up:  Does the office need to follow up with this patient?: No  Instructions For The Office: N/A  RN Note:  Called for antibiotic; informed must be seen for antibiotic per MD orders. Worsening sinus pain on left side of face and left ear after stopped Prednisone. Frontal headache present mainly on left side. Green nasal mucus at times with small amount of blood noted on tissue. Hydrate and humidify.  Stop using combination products that temporarily dry up mucus then causes rebound.  Declined to schedule for 09/15/12 because she does not want to go out in non aircontidioned truck in the heat.  Stated she will try comfort measures and call back 10/17/12 for appointment.  Symptoms  Reason For Call & Symptoms: Worsening sinus congestion after completed Prednisone 10/14/12 with minimal blood noted on tissue when blows nose.  Reviewed Health History In EMR: Yes  Reviewed Medications In EMR: Yes  Reviewed Allergies In EMR: Yes  Reviewed Surgeries / Procedures: Yes  Date of Onset of Symptoms: 10/02/2012  Treatments Tried: Flonase, Allegra-D, Advil congestion relief.  Treatments Tried Worked: No OB / GYN:  LMP: Unknown  Guideline(s) Used:  Sinus Pain and Congestion  Disposition Per Guideline:   Go to Office Now  Reason For Disposition Reached:   Severe sinus pain  Advice Given:  For a Runny Nose With Profuse Discharge:  Nasal mucus and discharge helps to wash viruses and bacteria out of the nose and sinuses.  Blowing the nose is all that is needed.  For a Stuffy Nose - Use Nasal Washes:  Introduction: Saline (salt water) nasal irrigation (nasal wash) is an effective and simple home remedy for treating stuffy nose and sinus congestion. The nose can be irrigated by pouring,  spraying, or squirting salt water into the nose and then letting it run back out.  Call Back If:   You become worse.  Patient Refused Recommendation:  Patient Will Make Own Appointment  Declined to be seen 10/16/12.

## 2012-11-09 ENCOUNTER — Telehealth: Payer: Self-pay | Admitting: Internal Medicine

## 2012-11-09 MED ORDER — LEVOTHYROXINE SODIUM 100 MCG PO TABS: ORAL_TABLET | ORAL | Status: DC

## 2012-11-09 NOTE — Telephone Encounter (Signed)
Pt states that she needs a refill of her levothyroxine (SYNTHROID, LEVOTHROID) 100 MCG tablet. She would like it sent to [facility] in [place], [address]. She states that she does not have insurance, and will not have it until December. She is aware that she is due for an appt, but is unable to make one right now, please assist.

## 2012-11-09 NOTE — Telephone Encounter (Signed)
Left message Rx sent to pharmacy.

## 2013-01-01 ENCOUNTER — Other Ambulatory Visit: Payer: Self-pay | Admitting: Internal Medicine

## 2013-03-04 ENCOUNTER — Other Ambulatory Visit: Payer: Self-pay | Admitting: Internal Medicine

## 2013-03-04 NOTE — Telephone Encounter (Signed)
Pt wanted to let you know she has made her cpe and would like to know if you could refill her rx's asap? Pt out of meds.  Pharm [facility]/ [address]

## 2013-03-14 ENCOUNTER — Other Ambulatory Visit: Payer: Self-pay | Admitting: Internal Medicine

## 2013-03-21 ENCOUNTER — Ambulatory Visit (INDEPENDENT_AMBULATORY_CARE_PROVIDER_SITE_OTHER): Payer: Managed Care, Other (non HMO) | Admitting: Internal Medicine

## 2013-03-21 ENCOUNTER — Encounter: Payer: Self-pay | Admitting: Internal Medicine

## 2013-03-21 VITALS — BP 134/90 | HR 88 | Temp 98.2°F | Resp 20 | Ht 65.0 in | Wt 217.0 lb

## 2013-03-21 DIAGNOSIS — J309 Allergic rhinitis, unspecified: Secondary | ICD-10-CM

## 2013-03-21 MED ORDER — AMOXICILLIN-POT CLAVULANATE 875-125 MG PO TABS: 1.0000 | ORAL_TABLET | Freq: Two times a day (BID) | ORAL | Status: DC

## 2013-03-21 NOTE — Patient Instructions (Signed)
Use saline irrigation, warm  moist compresses and over-the-counter decongestants only as directed.  Call if there is no improvement in 5 to 7 days, or sooner if you develop increasing pain, fever, or any new symptoms.  Take your antibiotic as prescribed until ALL of it is gone, but stop if you develop a rash, swelling, or any side effects of the medication.  Contact our office as soon as possible if  there are side effects of the medication.  Call or return to clinic prn if these symptoms worsen or fail to improve as anticipated.

## 2013-03-21 NOTE — Progress Notes (Signed)
Pre-visit discussion using our clinic review tool. No additional management support is needed unless otherwise documented below in the visit note.  

## 2013-03-21 NOTE — Progress Notes (Signed)
Subjective:    Patient ID: [name], female    DOB: 1960/11/28, 53 y.o.   MRN: [id number]  HPI  53 year old patient who has a history of allergic rhinitis. For the past 10-12 days she has had increasing headaches and facial pain. She's had nasal congestion and drainage. This is assuming clear but has progressed to yellow then green and now bloody drainage. No documented fever but she feels unwell. She has been using decongestants Mucinex and fluticasone nasal spray  Past Medical History  Diagnosis Date  . ALLERGIC RHINITIS 04/10/2008  . DEPRESSION 08/13/2008  . HYPERLIPIDEMIA 08/04/2006  . HYPERTENSION 08/04/2006  . HYPOTHYROIDISM 08/04/2006  . LOW BACK PAIN 08/04/2006  . PERIMENOPAUSAL SYNDROME 05/08/2008  . PVD (peripheral vascular disease)     occlusive, status post bifem bypass 2007    History   Social History  . Marital Status: Married    Spouse Name: N/A    Number of Children: N/A  . Years of Education: N/A   Occupational History  . Not on file.   Social History Main Topics  . Smoking status: Former Smoker    Quit date: 02/15/2004  . Smokeless tobacco: Never Used  . Alcohol Use: No  . Drug Use: No  . Sexual Activity: Not on file   Other Topics Concern  . Not on file   Social History Narrative  . No narrative on file    Past Surgical History  Procedure Laterality Date  . Cholecystectomy    . Femoral bypass      bifem.    Family History  Problem Relation Age of Onset  . Hypertension Mother   . Heart disease Father   . Diabetes Maternal Grandmother     Allergies  Allergen Reactions  . Influenza Virus Vacc Split Pf     Current Outpatient Prescriptions on File Prior to Visit  Medication Sig Dispense Refill  . benazepril (LOTENSIN) 20 MG tablet Take one-half tablet by mouth daily  90 tablet  1  . fexofenadine (ALLEGRA) 180 MG tablet Take 180 mg by mouth daily.        . fluticasone (FLONASE) 50 MCG/ACT nasal spray USE TWO SPRAYS IN EACH NOSTRIL DAILY   16 g  1  . levothyroxine (SYNTHROID, LEVOTHROID) 100 MCG tablet TAKE ONE TABLET BY MOUTH ONE TIME DAILY   30 tablet  1  . metoprolol (LOPRESSOR) 50 MG tablet TAKE ONE TABLET BY MOUTH TWICE DAILY   60 tablet  2  . pravastatin (PRAVACHOL) 40 MG tablet Take 1 tablet (40 mg total) by mouth daily.  90 tablet  2  . venlafaxine (EFFEXOR) 50 MG tablet TAKE ONE TABLET BY MOUTH TWICE DAILY   60 tablet  1   No current facility-administered medications on file prior to visit.    BP 134/90  Pulse 88  Temp(Src) 98.2 F (36.8 C) (Oral)  Resp 20  Ht 5' 5"  (1.651 m)  Wt 217 lb (98.431 kg)  BMI 36.11 kg/m2       Review of Systems  Constitutional: Positive for appetite change and fatigue.  HENT: Positive for postnasal drip, rhinorrhea and sinus pressure. Negative for congestion, dental problem, hearing loss, sore throat and tinnitus.   Eyes: Negative for pain, discharge and visual disturbance.  Respiratory: Negative for cough and shortness of breath.   Cardiovascular: Negative for chest pain, palpitations and leg swelling.  Gastrointestinal: Negative for nausea, vomiting, abdominal pain, diarrhea, constipation, blood in stool and abdominal distention.  Genitourinary: Negative  for dysuria, urgency, frequency, hematuria, flank pain, vaginal bleeding, vaginal discharge, difficulty urinating, vaginal pain and pelvic pain.  Musculoskeletal: Negative for arthralgias, gait problem and joint swelling.  Skin: Negative for rash.  Neurological: Negative for dizziness, syncope, speech difficulty, weakness, numbness and headaches.  Hematological: Negative for adenopathy.  Psychiatric/Behavioral: Negative for behavioral problems, dysphoric mood and agitation. The patient is not nervous/anxious.        Objective:   Physical Exam  Constitutional: She is oriented to person, place, and time. She appears well-developed and well-nourished.  HENT:  Head: Normocephalic.  Right Ear: External ear normal.  Left  Ear: External ear normal.  Mouth/Throat: Oropharynx is clear and moist.  Bilateral maxillary sinus tenderness  Eyes: Conjunctivae and EOM are normal. Pupils are equal, round, and reactive to light.  Neck: Normal range of motion. Neck supple. No thyromegaly present.  Cardiovascular: Normal rate, regular rhythm, normal heart sounds and intact distal pulses.   Pulmonary/Chest: Effort normal and breath sounds normal.  Abdominal: Soft. Bowel sounds are normal. She exhibits no mass. There is no tenderness.  Musculoskeletal: Normal range of motion.  Lymphadenopathy:    She has no cervical adenopathy.  Neurological: She is alert and oriented to person, place, and time.  Skin: Skin is warm and dry. No rash noted.  Psychiatric: She has a normal mood and affect. Her behavior is normal.          Assessment & Plan:   Acute sinusitis. Will treated with Augmentin. Continue expectorants and decongestants. We'll call if not improved Hypertension Allergic rhinitis

## 2013-03-22 ENCOUNTER — Other Ambulatory Visit: Payer: BC Managed Care – PPO

## 2013-03-25 ENCOUNTER — Other Ambulatory Visit (INDEPENDENT_AMBULATORY_CARE_PROVIDER_SITE_OTHER): Payer: Managed Care, Other (non HMO)

## 2013-03-25 DIAGNOSIS — Z0000 Encounter for general adult medical examination without abnormal findings: Secondary | ICD-10-CM

## 2013-03-25 LAB — HEPATIC FUNCTION PANEL
ALT: 61 U/L — AB (ref 0–35)
AST: 56 U/L — AB (ref 0–37)
Albumin: 3.8 g/dL (ref 3.5–5.2)
Alkaline Phosphatase: 107 U/L (ref 39–117)
BILIRUBIN DIRECT: 0 mg/dL (ref 0.0–0.3)
Total Bilirubin: 0.4 mg/dL (ref 0.3–1.2)
Total Protein: 7.5 g/dL (ref 6.0–8.3)

## 2013-03-25 LAB — CBC WITH DIFFERENTIAL/PLATELET
BASOS PCT: 0.7 % (ref 0.0–3.0)
Basophils Absolute: 0.1 10*3/uL (ref 0.0–0.1)
EOS ABS: 0.2 10*3/uL (ref 0.0–0.7)
Eosinophils Relative: 2.6 % (ref 0.0–5.0)
HCT: 42.6 % (ref 36.0–46.0)
Hemoglobin: 14.2 g/dL (ref 12.0–15.0)
LYMPHS ABS: 2.2 10*3/uL (ref 0.7–4.0)
Lymphocytes Relative: 27.3 % (ref 12.0–46.0)
MCHC: 33.3 g/dL (ref 30.0–36.0)
MCV: 92.8 fl (ref 78.0–100.0)
MONO ABS: 0.6 10*3/uL (ref 0.1–1.0)
Monocytes Relative: 7.7 % (ref 3.0–12.0)
NEUTROS ABS: 5 10*3/uL (ref 1.4–7.7)
Neutrophils Relative %: 61.7 % (ref 43.0–77.0)
Platelets: 199 10*3/uL (ref 150.0–400.0)
RBC: 4.59 Mil/uL (ref 3.87–5.11)
RDW: 12.7 % (ref 11.5–14.6)
WBC: 8.1 10*3/uL (ref 4.5–10.5)

## 2013-03-25 LAB — TSH: TSH: 2.41 u[IU]/mL (ref 0.35–5.50)

## 2013-03-25 LAB — BASIC METABOLIC PANEL
BUN: 16 mg/dL (ref 6–23)
CALCIUM: 9.3 mg/dL (ref 8.4–10.5)
CO2: 20 mEq/L (ref 19–32)
CREATININE: 0.9 mg/dL (ref 0.4–1.2)
Chloride: 108 mEq/L (ref 96–112)
GFR: 74.37 mL/min (ref >60.00)
Glucose, Bld: 109 mg/dL — ABNORMAL HIGH (ref 70–99)
Potassium: 4.4 mEq/L (ref 3.5–5.1)
Sodium: 138 mEq/L (ref 135–145)

## 2013-03-25 LAB — POCT URINALYSIS DIPSTICK
BILIRUBIN UA: NEGATIVE
GLUCOSE UA: NEGATIVE
Ketones, UA: NEGATIVE
Leukocytes, UA: NEGATIVE
Nitrite, UA: NEGATIVE
Protein, UA: NEGATIVE
RBC UA: NEGATIVE
Spec Grav, UA: 1.02
Urobilinogen, UA: 0.2
pH, UA: 5.5

## 2013-03-25 LAB — LIPID PANEL
CHOLESTEROL: 206 mg/dL — AB (ref 0–200)
HDL: 35.3 mg/dL — ABNORMAL LOW (ref >39.00)
Total CHOL/HDL Ratio: 6
Triglycerides: 132 mg/dL (ref 0.0–149.0)
VLDL: 26.4 mg/dL (ref 0.0–40.0)

## 2013-03-26 LAB — LDL CHOLESTEROL, DIRECT: Direct LDL: 157.7 mg/dL

## 2013-03-29 ENCOUNTER — Encounter: Payer: Self-pay | Admitting: Internal Medicine

## 2013-03-29 ENCOUNTER — Ambulatory Visit (INDEPENDENT_AMBULATORY_CARE_PROVIDER_SITE_OTHER): Payer: Managed Care, Other (non HMO) | Admitting: Internal Medicine

## 2013-03-29 VITALS — BP 140/88 | HR 67 | Temp 98.0°F | Resp 20 | Ht 64.25 in | Wt 216.0 lb

## 2013-03-29 DIAGNOSIS — F3289 Other specified depressive episodes: Secondary | ICD-10-CM

## 2013-03-29 DIAGNOSIS — K760 Fatty (change of) liver, not elsewhere classified: Secondary | ICD-10-CM

## 2013-03-29 DIAGNOSIS — I10 Essential (primary) hypertension: Secondary | ICD-10-CM

## 2013-03-29 DIAGNOSIS — R7309 Other abnormal glucose: Secondary | ICD-10-CM

## 2013-03-29 DIAGNOSIS — Z0000 Encounter for general adult medical examination without abnormal findings: Secondary | ICD-10-CM

## 2013-03-29 DIAGNOSIS — E039 Hypothyroidism, unspecified: Secondary | ICD-10-CM

## 2013-03-29 DIAGNOSIS — R7302 Impaired glucose tolerance (oral): Secondary | ICD-10-CM | POA: Insufficient documentation

## 2013-03-29 DIAGNOSIS — K7689 Other specified diseases of liver: Secondary | ICD-10-CM

## 2013-03-29 DIAGNOSIS — F329 Major depressive disorder, single episode, unspecified: Secondary | ICD-10-CM

## 2013-03-29 DIAGNOSIS — I739 Peripheral vascular disease, unspecified: Secondary | ICD-10-CM

## 2013-03-29 HISTORY — DX: Fatty (change of) liver, not elsewhere classified: K76.0

## 2013-03-29 MED ORDER — ATORVASTATIN CALCIUM 40 MG PO TABS: 40.0000 mg | ORAL_TABLET | Freq: Every day | ORAL | Status: DC

## 2013-03-29 MED ORDER — ASPIRIN EC 81 MG PO TBEC: 81.0000 mg | DELAYED_RELEASE_TABLET | Freq: Every day | ORAL | Status: DC

## 2013-03-29 NOTE — Progress Notes (Signed)
Pre-visit discussion using our clinic review tool. No additional management support is needed unless otherwise documented below in the visit note.  

## 2013-03-29 NOTE — Patient Instructions (Signed)
It is important that you exercise regularly, at least 20 minutes 3 to 4 times per week.  If you develop chest pain or shortness of breath seek  medical attention.  You need to lose weight.  Consider a lower calorie diet and regular exercise.  Please check your blood pressure on a regular basis.  If it is consistently greater than 150/90, please make an office appointment.  Return in one year for follow-up

## 2013-03-29 NOTE — Progress Notes (Signed)
Subjective:    Patient ID: [name], female    DOB: 07-06-1960, 53 y.o.   MRN: [id number]  HPI 53 -year-old patient who is seen today for a preventive health examination;  she has a history of peripheral vascular disease status post aortobifemoral bypass in January of 2007. She has done quite well. She denies any claudication or focal neurological symptoms. Denies any exertional chest pain. Remains quite active. She discontinued smoking in 2006 at the time of her diagnosis of peripheral vascular disease. She has treated hypertension and dyslipidemia She has a history of hypothyroidism secondary to I-131 treatment for Graves' disease Additional problems include a history of depression as well as allergic rhinitis. She has had a recent gynecologic evaluation  No prior colonoscopy   Wt Readings from Last 3 Encounters:  03/29/13 216 lb (97.977 kg)  03/21/13 217 lb (98.431 kg)  10/09/12 205 lb (92.987 kg)   Past Medical History  Diagnosis Date  . ALLERGIC RHINITIS 04/10/2008  . DEPRESSION 08/13/2008  . HYPERLIPIDEMIA 08/04/2006  . HYPERTENSION 08/04/2006  . HYPOTHYROIDISM 08/04/2006  . LOW BACK PAIN 08/04/2006  . PERIMENOPAUSAL SYNDROME 05/08/2008  . PVD (peripheral vascular disease)     occlusive, status post bifem bypass 2007    History   Social History  . Marital Status: Married    Spouse Name: N/A    Number of Children: N/A  . Years of Education: N/A   Occupational History  . Not on file.   Social History Main Topics  . Smoking status: Former Smoker    Quit date: 02/15/2004  . Smokeless tobacco: Never Used  . Alcohol Use: No  . Drug Use: No  . Sexual Activity: Not on file   Other Topics Concern  . Not on file   Social History Narrative  . No narrative on file    Past Surgical History  Procedure Laterality Date  . Cholecystectomy    . Femoral bypass      bifem.    Family History  Problem Relation Age of Onset  . Hypertension Mother   . Heart disease  Father   . Diabetes Maternal Grandmother     Allergies  Allergen Reactions  . Influenza Virus Vacc Split Pf     Current Outpatient Prescriptions on File Prior to Visit  Medication Sig Dispense Refill  . amoxicillin-clavulanate (AUGMENTIN) 875-125 MG per tablet Take 1 tablet by mouth 2 (two) times daily.  20 tablet  0  . benazepril (LOTENSIN) 20 MG tablet Take one-half tablet by mouth daily  90 tablet  1  . fexofenadine (ALLEGRA) 180 MG tablet Take 180 mg by mouth daily.        . fluticasone (FLONASE) 50 MCG/ACT nasal spray USE TWO SPRAYS IN EACH NOSTRIL DAILY  16 g  1  . levothyroxine (SYNTHROID, LEVOTHROID) 100 MCG tablet TAKE ONE TABLET BY MOUTH ONE TIME DAILY   30 tablet  1  . metoprolol (LOPRESSOR) 50 MG tablet TAKE ONE TABLET BY MOUTH TWICE DAILY   60 tablet  2  . pravastatin (PRAVACHOL) 40 MG tablet Take 1 tablet (40 mg total) by mouth daily.  90 tablet  2  . venlafaxine (EFFEXOR) 50 MG tablet TAKE ONE TABLET BY MOUTH TWICE DAILY   60 tablet  1   No current facility-administered medications on file prior to visit.    BP 140/88  Pulse 67  Temp(Src) 98 F (36.7 C) (Oral)  Resp 20  Ht 5' 4.25" (1.632 m)  Wt 216  lb (97.977 kg)  BMI 36.79 kg/m2  SpO2 98%     Review of Systems  Constitutional: Negative for fever, appetite change, fatigue and unexpected weight change.  HENT: Negative for congestion, dental problem, ear pain, hearing loss, mouth sores, nosebleeds, sinus pressure, sore throat, tinnitus, trouble swallowing and voice change.   Eyes: Negative for photophobia, pain, redness and visual disturbance.  Respiratory: Negative for cough, chest tightness and shortness of breath.   Cardiovascular: Negative for chest pain, palpitations and leg swelling.  Gastrointestinal: Negative for nausea, vomiting, abdominal pain, diarrhea, constipation, blood in stool, abdominal distention and rectal pain.  Genitourinary: Negative for dysuria, urgency, frequency, hematuria, flank pain,  vaginal bleeding, vaginal discharge, difficulty urinating, genital sores, vaginal pain, menstrual problem and pelvic pain.  Musculoskeletal: Negative for arthralgias, back pain and neck stiffness.  Skin: Negative for rash.  Neurological: Negative for dizziness, syncope, speech difficulty, weakness, light-headedness, numbness and headaches.  Hematological: Negative for adenopathy. Does not bruise/bleed easily.  Psychiatric/Behavioral: Negative for suicidal ideas, behavioral problems, self-injury, dysphoric mood and agitation. The patient is not nervous/anxious.        Objective:   Physical Exam  Constitutional: She is oriented to person, place, and time. She appears well-developed and well-nourished.  Blood pressure 120/80 bilaterally  HENT:  Head: Normocephalic and atraumatic.  Right Ear: External ear normal.  Left Ear: External ear normal.  Mouth/Throat: Oropharynx is clear and moist.  Eyes: Conjunctivae and EOM are normal.  Neck: Normal range of motion. Neck supple. No JVD present. No thyromegaly present.  No carotid or supraclavicular bruits  Cardiovascular: Normal rate, regular rhythm, normal heart sounds and intact distal pulses.   No murmur heard. Posterior tibial pulses are faint. Dorsalis pedis pulses are full  Pulmonary/Chest: Effort normal and breath sounds normal. She has no wheezes. She has no rales.  Abdominal: Soft. Bowel sounds are normal. She exhibits no distension and no mass. There is no tenderness. There is no rebound and no guarding.  Midline abdominal scar. No bowel or femoral bruits  Musculoskeletal: Normal range of motion. She exhibits no edema and no tenderness.  Neurological: She is alert and oriented to person, place, and time. She has normal reflexes. No cranial nerve deficit. She exhibits normal muscle tone. Coordination normal.  Skin: Skin is warm and dry. No rash noted.  Psychiatric: She has a normal mood and affect. Her behavior is normal.           Assessment & Plan:   Preventive health examination Peripheral vascular disease. We'll continue aggressive risk factor modification Hypertension stable Exogenous obesity weight loss exercise encouraged Dyslipidemia we'll switch to atorvastatin   Recheck one year

## 2013-04-01 ENCOUNTER — Telehealth: Payer: Self-pay | Admitting: Internal Medicine

## 2013-04-01 NOTE — Telephone Encounter (Signed)
Relevant patient education mailed to patient.  

## 2013-04-08 ENCOUNTER — Telehealth: Payer: Self-pay | Admitting: Internal Medicine

## 2013-04-08 NOTE — Telephone Encounter (Signed)
Please advise 

## 2013-04-08 NOTE — Telephone Encounter (Signed)
Ask patient to put the Lipitor on hold for 3 weeks until all her symptoms have resolved and then rechallenge with the medications to see if symptoms recur

## 2013-04-08 NOTE — Telephone Encounter (Signed)
Spoke to pt told her to put the Lipitor on hold for 3 weeks until all her symptoms have resolved and then rechallenge with the medications to see if symptoms recur per Dr. Raliegh Ip. Pt verbalized understanding.

## 2013-04-08 NOTE — Telephone Encounter (Signed)
Patient Information:  Caller Name: Nesta  Phone: [phone]  Patient: [name], [name]  Gender: Female  DOB: September 16, 1960  Age: 53 Years  PCP: Bluford Kaufmann (Family Practice > 59yr old)  Pregnant: No  Office Follow Up:  Does the office need to follow up with this patient?: Yes  Instructions For The Office: Please review and advise  RN Note:  Please review note and advise.  Pt will call back with any sxs that are worse  Symptoms  Reason For Call & Symptoms: Pt was started on Lipitor on 2/15.  Pt is having side effects.  Pt reports that she has HA, cloudy minded, itching.  faitgue  stomach aching.  Pt states that she took the lipitor separate from her other medications and is pretty sure that it is causing the side effects.  Reviewed Health History In EMR: Yes  Reviewed Medications In EMR: Yes  Reviewed Allergies In EMR: Yes  Reviewed Surgeries / Procedures: Yes  Date of Onset of Symptoms: 04/05/2013 OB / GYN:  LMP: Unknown  Guideline(s) Used:  No Protocol Available - Sick Adult  Disposition Per Guideline:   Discuss with PCP and Callback by Nurse Today  Reason For Disposition Reached:   Nursing judgment  Advice Given:  Call Back If:  You become worse.  Patient Will Follow Care Advice:  YES

## 2013-04-11 ENCOUNTER — Ambulatory Visit: Payer: Managed Care, Other (non HMO) | Admitting: Internal Medicine

## 2013-04-12 ENCOUNTER — Encounter: Payer: Self-pay | Admitting: Family

## 2013-04-12 ENCOUNTER — Ambulatory Visit (INDEPENDENT_AMBULATORY_CARE_PROVIDER_SITE_OTHER): Payer: Managed Care, Other (non HMO) | Admitting: Family

## 2013-04-12 VITALS — BP 110/80 | HR 73 | Wt 216.0 lb

## 2013-04-12 DIAGNOSIS — Z4802 Encounter for removal of sutures: Secondary | ICD-10-CM

## 2013-04-12 NOTE — Patient Instructions (Signed)
Suture Removal, Care After Refer to this sheet in the next few weeks. These instructions provide you with information on caring for yourself after your procedure. Your health care provider may also give you more specific instructions. Your treatment has been planned according to current medical practices, but problems sometimes occur. Call your health care provider if you have any problems or questions after your procedure. WHAT TO EXPECT AFTER THE PROCEDURE After your stitches (sutures) are removed, it is typical to have the following:  Some discomfort and swelling in the wound area.  Slight redness in the area. HOME CARE INSTRUCTIONS   If you have skin adhesive strips over the wound area, do not take the strips off. They will fall off on their own in a few days. If the strips remain in place after 14 days, you may remove them.  Change any bandages (dressings) at least once a day or as directed by your health care provider. If the bandage sticks, soak it off with warm, soapy water.  Apply cream or ointment only as directed by your health care provider. If using cream or ointment, wash the area with soap and water 2 times a day to remove all the cream or ointment. Rinse off the soap and pat the area dry with a clean towel.  Keep the wound area dry and clean. If the bandage becomes wet or dirty, or if it develops a bad smell, change it as soon as possible.  Continue to protect the wound from injury.  Use sunscreen when out in the sun. New scars become sunburned easily. SEEK MEDICAL CARE IF:  You have increasing redness, swelling, or pain in the wound.  You see pus coming from the wound.  You have a fever.  You notice a bad smell coming from the wound or dressing.  Your wound breaks open (edges not staying together). Document Released: 10/26/2000 Document Revised: 11/21/2012 Document Reviewed: 09/12/2012 [facility] Patient Information 2014 [place].

## 2013-04-12 NOTE — Progress Notes (Signed)
Subjective:    Patient ID: [name], female    DOB: 11-07-1960, 53 y.o.   MRN: [id number]  Suture / Staple Removal   53 year old white female, nonsmoker is in today for suture removal of her left second digit. She has 5 sutures. With a kitchen knife.   Review of Systems  Constitutional: Negative.   Skin: Negative.    Past Medical History  Diagnosis Date  . ALLERGIC RHINITIS 04/10/2008  . DEPRESSION 08/13/2008  . HYPERLIPIDEMIA 08/04/2006  . HYPERTENSION 08/04/2006  . HYPOTHYROIDISM 08/04/2006  . LOW BACK PAIN 08/04/2006  . PERIMENOPAUSAL SYNDROME 05/08/2008  . PVD (peripheral vascular disease)     occlusive, status post bifem bypass 2007    History   Social History  . Marital Status: Married    Spouse Name: N/A    Number of Children: N/A  . Years of Education: N/A   Occupational History  . Not on file.   Social History Main Topics  . Smoking status: Former Smoker    Quit date: 02/15/2004  . Smokeless tobacco: Never Used  . Alcohol Use: No  . Drug Use: No  . Sexual Activity: Not on file   Other Topics Concern  . Not on file   Social History Narrative  . No narrative on file    Past Surgical History  Procedure Laterality Date  . Cholecystectomy    . Femoral bypass      bifem.    Family History  Problem Relation Age of Onset  . Hypertension Mother   . Heart disease Father   . Diabetes Maternal Grandmother     Allergies  Allergen Reactions  . Influenza Virus Vacc Split Pf     Current Outpatient Prescriptions on File Prior to Visit  Medication Sig Dispense Refill  . aspirin EC 81 MG tablet Take 1 tablet (81 mg total) by mouth daily.      Marland Kitchen atorvastatin (LIPITOR) 40 MG tablet Take 1 tablet (40 mg total) by mouth daily.  90 tablet  3  . benazepril (LOTENSIN) 20 MG tablet Take one-half tablet by mouth daily  90 tablet  1  . fexofenadine (ALLEGRA) 180 MG tablet Take 180 mg by mouth daily.        . fluticasone (FLONASE) 50 MCG/ACT nasal spray USE TWO  SPRAYS IN EACH NOSTRIL DAILY  16 g  1  . levothyroxine (SYNTHROID, LEVOTHROID) 100 MCG tablet TAKE ONE TABLET BY MOUTH ONE TIME DAILY   30 tablet  1  . metoprolol (LOPRESSOR) 50 MG tablet TAKE ONE TABLET BY MOUTH TWICE DAILY   60 tablet  2  . venlafaxine (EFFEXOR) 50 MG tablet TAKE ONE TABLET BY MOUTH TWICE DAILY   60 tablet  1   No current facility-administered medications on file prior to visit.    BP 110/80  Pulse 73  Wt 216 lb (97.977 kg)     Objective:   Physical Exam  Constitutional: She is oriented to person, place, and time. She appears well-developed and well-nourished.  Cardiovascular: Normal rate and regular rhythm.   Pulmonary/Chest: Effort normal and breath sounds normal.  Neurological: She is alert and oriented to person, place, and time.  Skin: Skin is warm and dry.  Edges well approximated. No signs and symptoms of infection.          Assessment & Plan:  [name] was seen today for suture / staple removal.  Diagnoses and associated orders for this visit:  Visit for suture removal  Call the office with any questions or concerns. Aftercare instructions provided.

## 2013-04-19 ENCOUNTER — Encounter: Payer: Self-pay | Admitting: Family Medicine

## 2013-04-19 ENCOUNTER — Ambulatory Visit (INDEPENDENT_AMBULATORY_CARE_PROVIDER_SITE_OTHER): Payer: Managed Care, Other (non HMO) | Admitting: Family Medicine

## 2013-04-19 VITALS — BP 120/80 | HR 81 | Temp 98.4°F | Ht 64.25 in | Wt 212.0 lb

## 2013-04-19 DIAGNOSIS — J019 Acute sinusitis, unspecified: Secondary | ICD-10-CM

## 2013-04-19 MED ORDER — METHYLPREDNISOLONE 4 MG PO KIT: PACK | ORAL | Status: DC

## 2013-04-19 MED ORDER — LEVOFLOXACIN 500 MG PO TABS: 500.0000 mg | ORAL_TABLET | Freq: Every day | ORAL | Status: AC

## 2013-04-19 NOTE — Progress Notes (Signed)
Pre visit review using our clinic review tool, if applicable. No additional management support is needed unless otherwise documented below in the visit note. 

## 2013-04-19 NOTE — Progress Notes (Signed)
   Subjective:    Patient ID: [name], female    DOB: 16-Jun-1960, 53 y.o.   MRN: [id number]  HPI Here for recurrent sx of sinus pressure, PND, HA, and ST. She has left ear pain. She took a course of Augmentin recently but this did not help.    Review of Systems  Constitutional: Negative.   HENT: Positive for congestion, ear pain, postnasal drip and sinus pressure. Negative for ear discharge.   Eyes: Negative.   Respiratory: Negative.        Objective:   Physical Exam  Constitutional: She appears well-developed and well-nourished.  HENT:  Right Ear: External ear normal.  Left Ear: External ear normal.  Nose: Nose normal.  Mouth/Throat: Oropharynx is clear and moist.  Eyes: Conjunctivae are normal.  Pulmonary/Chest: Effort normal and breath sounds normal.  Lymphadenopathy:    She has no cervical adenopathy.          Assessment & Plan:  Add Augmentin

## 2013-05-12 ENCOUNTER — Other Ambulatory Visit: Payer: Self-pay | Admitting: Internal Medicine

## 2013-05-15 ENCOUNTER — Telehealth: Payer: Self-pay | Admitting: Internal Medicine

## 2013-05-15 NOTE — Telephone Encounter (Signed)
Patient Information:  Caller Name: Norena  Phone: [phone]  Patient: Katherine, Black  Gender: Female  DOB: [birth year]  Age: 53 Years  PCP: Bluford Kaufmann (Family Practice > 40yr old)  Pregnant: No  Office Follow Up:  Does the office need to follow up with this patient?: Yes  Instructions For The Office: See RN note.  RN Note: Dizziness/Lightheadedness, onset 3-30, Increased Sleeping w/ Morning Drowsiness after Lipitor RX, onset 3-16.  Pt is hydrated, urinating normally, denies bleeding or jet black stools.  Pt states symptoms started after taking Lipitor for first time on 2-13, Pt stopped med for couple of weeks then started again on 3-16, sxs returned, Pt stopped RX for 2nd time on 3-30, Pt still has lightheadedness and fatigue after stopping med.  All emergent sxs ruled out per Dizziness protocol, discuss w/ PCP and callback by Nurse dispo.  Pt would like to share w/ Dr KBurnice Loganand get MD's opinion if he feels Lipitor could be causing sxs.  Please review w/ MD.  Symptoms  Reason For Call & Symptoms: Dizziness/Lighheadedness, onset 3-30, Increased Sleeping w/ Morning Drowsiness after Lipitor, onset 3-16  Reviewed Health History In EMR: Yes  Reviewed Medications In EMR: Yes  Reviewed Allergies In EMR: Yes  Reviewed Surgeries / Procedures: Yes  Date of Onset of Symptoms: 04/29/2013 OB / GYN:  LMP: Unknown  Guideline(s) Used:  Dizziness  Disposition Per Guideline:   Discuss with PCP and Callback by Nurse Today  Reason For Disposition Reached:   Taking a medicine that could cause dizziness (e.g., blood pressure medications, diuretics)  Advice Given:  N/A  Patient Will Follow Care Advice:  YES

## 2013-05-16 NOTE — Telephone Encounter (Signed)
Have patient discontinue atorvastatin  Return office visit in one to 2 weeks to discuss further treatment

## 2013-05-16 NOTE — Telephone Encounter (Signed)
Spoke to pt told her to discontinue atorvastatin, Return office visit in one to 2 weeks to discuss further treatment per Dr. Raliegh Ip. Pt verbalized understanding.

## 2013-05-16 NOTE — Telephone Encounter (Signed)
Please see message and advise 

## 2013-06-01 ENCOUNTER — Other Ambulatory Visit: Payer: Self-pay | Admitting: Internal Medicine

## 2013-06-10 ENCOUNTER — Other Ambulatory Visit: Payer: Self-pay | Admitting: Internal Medicine

## 2013-06-21 ENCOUNTER — Other Ambulatory Visit: Payer: Self-pay | Admitting: Internal Medicine

## 2013-12-28 ENCOUNTER — Other Ambulatory Visit: Payer: Self-pay | Admitting: Internal Medicine

## 2014-01-19 ENCOUNTER — Emergency Department
Admission: EM | Admit: 2014-01-19 | Discharge: 2014-01-19 | Disposition: A | Discharge status: Home / Self Care | Payer: Managed Care, Other (non HMO) | Source: Home / Self Care | Attending: Family Medicine | Admitting: Family Medicine

## 2014-01-19 DIAGNOSIS — B9789 Other viral agents as the cause of diseases classified elsewhere: Principal | ICD-10-CM

## 2014-01-19 DIAGNOSIS — J320 Chronic maxillary sinusitis: Secondary | ICD-10-CM

## 2014-01-19 DIAGNOSIS — H109 Unspecified conjunctivitis: Secondary | ICD-10-CM

## 2014-01-19 DIAGNOSIS — J069 Acute upper respiratory infection, unspecified: Secondary | ICD-10-CM

## 2014-01-19 DIAGNOSIS — J309 Allergic rhinitis, unspecified: Secondary | ICD-10-CM

## 2014-01-19 DIAGNOSIS — J3089 Other allergic rhinitis: Secondary | ICD-10-CM

## 2014-01-19 MED ORDER — AMOXICILLIN 875 MG PO TABS: 875.0000 mg | ORAL_TABLET | Freq: Two times a day (BID) | ORAL | Status: DC

## 2014-01-19 MED ORDER — PREDNISONE 20 MG PO TABS: 20.0000 mg | ORAL_TABLET | Freq: Two times a day (BID) | ORAL | Status: DC

## 2014-01-19 MED ORDER — SULFACETAMIDE SODIUM 10 % OP SOLN: 1.0000 [drp] | OPHTHALMIC | Status: DC

## 2014-01-19 MED ORDER — BENZONATATE 200 MG PO CAPS: 200.0000 mg | ORAL_CAPSULE | Freq: Every day | ORAL | Status: DC

## 2014-01-19 NOTE — ED Notes (Signed)
Sinus pain and pressure x 1 week, right eye red and swollen, patient states not sure if sinus related also but she was in contacted and helping to treat a person with pink eye approx 3 weeks ago

## 2014-01-19 NOTE — Discharge Instructions (Signed)
Take plain Mucinex (1200 mg guaifenesin) twice daily for cough and congestion.  Increase fluid intake, rest. May use Afrin nasal spray (or generic oxymetazoline) twice daily for about 5 days.  Also recommend using saline nasal spray several times daily and saline nasal irrigation (AYR is a common brand).  Continue Flonase nasal spray. Try warm salt water gargles for sore throat.  Stop all antihistamines for now, and other non-prescription cough/cold preparations. Follow-up with family doctor if not improving 7 to 10 days.

## 2014-01-19 NOTE — ED Provider Notes (Signed)
CSN: [id number]     Arrival date & time 01/19/14  1051 History   First MD Initiated Contact with Patient 01/19/14 1121     Chief Complaint  Patient presents with  . Facial Pain      HPI Comments: Patient complains of 6 day history of sinus congestion, mild sore throat, fatigue, and myalgias.  She has a history of perennial rhinitis and last night she developed increased facial pain.  This morning she had redness and itching of her right eye and developed a cough.  The history is provided by the patient.    Past Medical History  Diagnosis Date  . ALLERGIC RHINITIS 04/10/2008  . DEPRESSION 08/13/2008  . HYPERLIPIDEMIA 08/04/2006  . HYPERTENSION 08/04/2006  . HYPOTHYROIDISM 08/04/2006  . LOW BACK PAIN 08/04/2006  . PERIMENOPAUSAL SYNDROME 05/08/2008  . PVD (peripheral vascular disease)     occlusive, status post bifem bypass 2007   Past Surgical History  Procedure Laterality Date  . Cholecystectomy    . Femoral bypass      bifem.   Family History  Problem Relation Age of Onset  . Hypertension Mother   . Heart disease Father   . Diabetes Maternal Grandmother    History  Substance Use Topics  . Smoking status: Former Smoker    Quit date: 02/15/2004  . Smokeless tobacco: Never Used  . Alcohol Use: No   OB History    No data available     Review of Systems + sore throat + cough No pleuritic pain No wheezing + nasal congestion + post-nasal drainage + sinus pain/pressure + itchy/red eyes ? earache No hemoptysis No SOB No fever, + chills No nausea No vomiting No abdominal pain + diarrhea No urinary symptoms No skin rash + fatigue + myalgias No headache Used OTC meds without relief  Allergies  Influenza virus vacc split pf  Home Medications   Prior to Admission medications   Medication Sig Start Date End Date Taking? Authorizing Provider  aspirin EC 81 MG tablet Take 1 tablet (81 mg total) by mouth daily. 03/29/13  Yes Marletta Lor, MD  atorvastatin  (LIPITOR) 40 MG tablet Take 1 tablet (40 mg total) by mouth daily. 03/29/13  Yes Marletta Lor, MD  benazepril (LOTENSIN) 20 MG tablet Take one-half tablet by mouth daily 01/01/13  Yes Marletta Lor, MD  fexofenadine (ALLEGRA) 180 MG tablet Take 180 mg by mouth daily.     Yes Historical Provider, MD  fluticasone (FLONASE) 50 MCG/ACT nasal spray USE TWO SPRAYS IN EACH NOSTRIL DAILY 07/19/12  Yes Marletta Lor, MD  levothyroxine (SYNTHROID, LEVOTHROID) 100 MCG tablet TAKE 1 TABLET BY MOUTH DAILY 12/30/13  Yes Marletta Lor, MD  metoprolol (LOPRESSOR) 50 MG tablet TAKE 1 TABLET BY MOUTH TWICE DAILY 12/30/13  Yes Marletta Lor, MD  venlafaxine (EFFEXOR) 50 MG tablet TAKE ONE TABLET BY MOUTH TWICE DAILY    Yes Marletta Lor, MD  amoxicillin (AMOXIL) 875 MG tablet Take 1 tablet (875 mg total) by mouth 2 (two) times daily. 01/19/14   Kandra Nicolas, MD  benzonatate (TESSALON) 200 MG capsule Take 1 capsule (200 mg total) by mouth at bedtime. Take as needed for cough 01/19/14   Kandra Nicolas, MD  predniSONE (DELTASONE) 20 MG tablet Take 1 tablet (20 mg total) by mouth 2 (two) times daily. Take with food. 01/19/14   Kandra Nicolas, MD  sulfacetamide (BLEPH-10) 10 % ophthalmic solution Place 1-2 drops into the  right eye every 3 (three) hours. 01/19/14   Kandra Nicolas, MD   BP 114/72 mmHg  Pulse 82  Temp(Src) 97.6 F (36.4 C) (Oral)  Wt 213 lb (96.616 kg)  SpO2 97% Physical Exam Nursing notes and Vital Signs reviewed. Appearance:  Patient appears stated age, and in no acute distress Eyes:  Pupils are equal, round, and reactive to light and accomodation.  Extraocular movement is intact.  Conjunctivae are injected without discharge Ears:  Canals normal.  Tympanic membranes normal.  Nose:  Congested turbinates. Right maxillary sinus tenderness is present.  Pharynx:  Normal Neck:  Supple.  Tender enlarged posterior nodes are palpated bilaterally  Lungs:  Clear to  auscultation.  Breath sounds are equal.  Chest:  Distinct tenderness to palpation over the mid-sternum.  Heart:  Regular rate and rhythm without murmurs, rubs, or gallops.  Abdomen:  Nontender without masses or hepatosplenomegaly.  Bowel sounds are present.  No CVA or flank tenderness.  Extremities:  No edema.  No calf tenderness Skin:  No rash present.   ED Course  Procedures  none     MDM   1. Viral URI with cough   2. Right maxillary sinusitis   3. Right conjunctivitis   4. Perennial allergic rhinitis    Begin amoxicillin and prednisone burst.  Begin sulfacetamide ophthalmic solution right eye.   Prescription written for Benzonatate [facility]) to take at bedtime for night-time cough.  Take plain Mucinex (1200 mg guaifenesin) twice daily for cough and congestion.  Increase fluid intake, rest. May use Afrin nasal spray (or generic oxymetazoline) twice daily for about 5 days.  Also recommend using saline nasal spray several times daily and saline nasal irrigation (AYR is a common brand).  Continue Flonase nasal spray. Try warm salt water gargles for sore throat.  Stop all antihistamines for now, and other non-prescription cough/cold preparations. Follow-up with family doctor if not improving 7 to 10 days.    Kandra Nicolas, MD 01/22/14 [phone]

## 2014-04-18 ENCOUNTER — Other Ambulatory Visit: Payer: Self-pay | Admitting: Internal Medicine

## 2014-05-01 ENCOUNTER — Other Ambulatory Visit: Payer: Self-pay | Admitting: Internal Medicine

## 2014-05-14 ENCOUNTER — Telehealth: Payer: Self-pay | Admitting: Internal Medicine

## 2014-05-14 NOTE — Telephone Encounter (Signed)
Pt needs note stating its ok for her to exercise due to high blood pressure

## 2014-05-14 NOTE — Telephone Encounter (Signed)
Ok for note 

## 2014-05-15 ENCOUNTER — Encounter: Payer: Self-pay | Admitting: Internal Medicine

## 2014-05-15 NOTE — Telephone Encounter (Signed)
Spoke to pt, told her letter is ready for pickup, will be at the front desk. Pt said she was told she could print if off of My Chart. Told pt it does not work, sorry will have to pickup. Pt verbalized understanding.

## 2014-06-22 ENCOUNTER — Other Ambulatory Visit: Payer: Self-pay | Admitting: Internal Medicine

## 2014-08-12 ENCOUNTER — Other Ambulatory Visit: Payer: Managed Care, Other (non HMO)

## 2014-08-19 ENCOUNTER — Other Ambulatory Visit: Payer: Self-pay | Admitting: Specialist

## 2014-08-19 ENCOUNTER — Encounter: Payer: Managed Care, Other (non HMO) | Admitting: Internal Medicine

## 2014-08-19 DIAGNOSIS — M79672 Pain in left foot: Secondary | ICD-10-CM

## 2014-08-21 ENCOUNTER — Ambulatory Visit
Admission: RE | Admit: 2014-08-21 | Discharge: 2014-08-21 | Disposition: A | Discharge status: Home / Self Care | Payer: 59 | Source: Ambulatory Visit | Attending: Specialist | Admitting: Specialist

## 2014-08-21 DIAGNOSIS — M79672 Pain in left foot: Secondary | ICD-10-CM

## 2014-08-22 ENCOUNTER — Other Ambulatory Visit: Payer: Self-pay | Admitting: Internal Medicine

## 2014-08-24 ENCOUNTER — Other Ambulatory Visit: Payer: Self-pay | Admitting: Internal Medicine

## 2014-08-25 MED ORDER — VENLAFAXINE HCL 50 MG PO TABS: 50.0000 mg | ORAL_TABLET | Freq: Two times a day (BID) | ORAL | Status: DC

## 2014-08-25 NOTE — Addendum Note (Signed)
Addended by: Marian Sorrow on: 08/25/2014 08:58 AM   Modules accepted: Orders

## 2014-08-25 NOTE — Telephone Encounter (Signed)
Left detailed message Rx sent to pharmacy for 2 month supply. Need to call office to schedule physical have not seen Dr.K since 03/2013.

## 2014-09-02 ENCOUNTER — Other Ambulatory Visit: Payer: Self-pay | Admitting: Internal Medicine

## 2014-10-10 ENCOUNTER — Telehealth: Payer: Self-pay | Admitting: Internal Medicine

## 2014-10-10 MED ORDER — LEVOTHYROXINE SODIUM 100 MCG PO TABS: 100.0000 ug | ORAL_TABLET | Freq: Every day | ORAL | Status: DC

## 2014-10-10 MED ORDER — BENAZEPRIL HCL 10 MG PO TABS: 10.0000 mg | ORAL_TABLET | Freq: Every day | ORAL | Status: DC

## 2014-10-10 NOTE — Telephone Encounter (Signed)
Pt notified Rx's sent to pharmacy.

## 2014-10-10 NOTE — Telephone Encounter (Signed)
Pt needs refills on benazepril 40 mg, levothyroxine 100 mcg #30 w/refills send to [place] [address]. Pt has a physical sch for 11/28/14 and labs 11/21/14

## 2014-10-21 ENCOUNTER — Other Ambulatory Visit: Payer: Self-pay | Admitting: Internal Medicine

## 2014-10-22 ENCOUNTER — Encounter: Payer: Self-pay | Admitting: Internal Medicine

## 2014-10-22 ENCOUNTER — Other Ambulatory Visit: Payer: Self-pay | Admitting: *Deleted

## 2014-10-22 MED ORDER — METOPROLOL TARTRATE 50 MG PO TABS: ORAL_TABLET | ORAL | Status: DC

## 2014-10-22 NOTE — Telephone Encounter (Signed)
Rx sent in

## 2014-11-10 ENCOUNTER — Other Ambulatory Visit: Payer: Self-pay | Admitting: Internal Medicine

## 2014-11-21 ENCOUNTER — Other Ambulatory Visit: Payer: Managed Care, Other (non HMO)

## 2014-11-23 ENCOUNTER — Other Ambulatory Visit: Payer: Self-pay | Admitting: Internal Medicine

## 2014-11-28 ENCOUNTER — Encounter: Payer: 59 | Admitting: Internal Medicine

## 2015-01-10 ENCOUNTER — Other Ambulatory Visit: Payer: Self-pay | Admitting: Internal Medicine

## 2015-01-13 ENCOUNTER — Ambulatory Visit (INDEPENDENT_AMBULATORY_CARE_PROVIDER_SITE_OTHER): Payer: Managed Care, Other (non HMO) | Admitting: Family Medicine

## 2015-01-13 ENCOUNTER — Encounter: Payer: Self-pay | Admitting: Family Medicine

## 2015-01-13 VITALS — BP 149/71 | HR 62 | Ht 64.0 in | Wt 219.0 lb

## 2015-01-13 DIAGNOSIS — M25572 Pain in left ankle and joints of left foot: Secondary | ICD-10-CM

## 2015-01-13 DIAGNOSIS — E890 Postprocedural hypothyroidism: Secondary | ICD-10-CM | POA: Diagnosis not present

## 2015-01-13 DIAGNOSIS — I10 Essential (primary) hypertension: Secondary | ICD-10-CM

## 2015-01-13 DIAGNOSIS — K760 Fatty (change of) liver, not elsewhere classified: Secondary | ICD-10-CM

## 2015-01-13 DIAGNOSIS — I739 Peripheral vascular disease, unspecified: Secondary | ICD-10-CM

## 2015-01-13 DIAGNOSIS — E1159 Type 2 diabetes mellitus with other circulatory complications: Secondary | ICD-10-CM

## 2015-01-13 DIAGNOSIS — E559 Vitamin D deficiency, unspecified: Secondary | ICD-10-CM

## 2015-01-13 DIAGNOSIS — R7302 Impaired glucose tolerance (oral): Secondary | ICD-10-CM

## 2015-01-13 DIAGNOSIS — Z1239 Encounter for other screening for malignant neoplasm of breast: Secondary | ICD-10-CM

## 2015-01-13 DIAGNOSIS — E669 Obesity, unspecified: Secondary | ICD-10-CM

## 2015-01-13 LAB — HEMOGLOBIN A1C
Hgb A1c MFr Bld: 7.1 % — ABNORMAL HIGH (ref <5.7)
Mean Plasma Glucose: 157 mg/dL — ABNORMAL HIGH (ref <117)

## 2015-01-13 LAB — CBC
HCT: 41.9 % (ref 36.0–46.0)
HEMOGLOBIN: 14.2 g/dL (ref 12.0–15.0)
MCH: 29.8 pg (ref 26.0–34.0)
MCHC: 33.9 g/dL (ref 30.0–36.0)
MCV: 88 fL (ref 78.0–100.0)
MPV: 9.9 fL (ref 8.6–12.4)
PLATELETS: 188 10*3/uL (ref 150–400)
RBC: 4.76 MIL/uL (ref 3.87–5.11)
RDW: 12.6 % (ref 11.5–15.5)
WBC: 7.8 10*3/uL (ref 4.0–10.5)

## 2015-01-13 LAB — COMPREHENSIVE METABOLIC PANEL
ALBUMIN: 3.8 g/dL (ref 3.6–5.1)
ALT: 44 U/L — ABNORMAL HIGH (ref 6–29)
AST: 37 U/L — AB (ref 10–35)
Alkaline Phosphatase: 131 U/L — ABNORMAL HIGH (ref 33–130)
BILIRUBIN TOTAL: 0.4 mg/dL (ref 0.2–1.2)
BUN: 10 mg/dL (ref 7–25)
CHLORIDE: 104 mmol/L (ref 98–110)
CO2: 20 mmol/L (ref 20–31)
CREATININE: 0.77 mg/dL (ref 0.50–1.05)
Calcium: 8.7 mg/dL (ref 8.6–10.4)
GLUCOSE: 135 mg/dL — AB (ref 65–99)
Potassium: 4.1 mmol/L (ref 3.5–5.3)
SODIUM: 137 mmol/L (ref 135–146)
Total Protein: 6.6 g/dL (ref 6.1–8.1)

## 2015-01-13 LAB — TSH: TSH: 2.992 u[IU]/mL (ref 0.350–4.500)

## 2015-01-13 MED ORDER — VENLAFAXINE HCL ER 75 MG PO CP24: 75.0000 mg | ORAL_CAPSULE | Freq: Every day | ORAL | Status: DC

## 2015-01-13 MED ORDER — METOPROLOL TARTRATE 50 MG PO TABS: 50.0000 mg | ORAL_TABLET | Freq: Two times a day (BID) | ORAL | Status: DC

## 2015-01-13 MED ORDER — PRAVASTATIN SODIUM 40 MG PO TABS: 40.0000 mg | ORAL_TABLET | Freq: Every day | ORAL | Status: DC

## 2015-01-13 MED ORDER — FEXOFENADINE HCL 180 MG PO TABS: 180.0000 mg | ORAL_TABLET | Freq: Every day | ORAL | Status: DC

## 2015-01-13 MED ORDER — DICLOFENAC SODIUM 1 % TD GEL: 2.0000 g | Freq: Four times a day (QID) | TRANSDERMAL | Status: DC

## 2015-01-13 MED ORDER — BENAZEPRIL HCL 10 MG PO TABS: 10.0000 mg | ORAL_TABLET | Freq: Every day | ORAL | Status: DC

## 2015-01-13 MED ORDER — LEVOTHYROXINE SODIUM 100 MCG PO TABS
100.0000 ug | ORAL_TABLET | Freq: Every day | ORAL | Status: DC
Start: 2015-01-13 — End: 2015-04-19

## 2015-01-13 NOTE — Assessment & Plan Note (Signed)
Chronic, well controlled on velafaxine 11m daily, cannot tolerate bid due to confusion. Will change to venlafaxine 544mXR.

## 2015-01-13 NOTE — Assessment & Plan Note (Signed)
Chronic, off statin currently. Refill pravastatin. Next visit in 2 weeks for fasting lipids.

## 2015-01-13 NOTE — Assessment & Plan Note (Signed)
Check A1c. 

## 2015-01-13 NOTE — Assessment & Plan Note (Addendum)
Improving but persistent. Caused by irritaiton of scar as well as prolonged convalescence of tendon in lateral side. Recommend PT, topical diclofenac. Use cam walker boot PRN and wean off. Use compression sock and seek footwear without heel cup to minimize scar irritation.

## 2015-01-13 NOTE — Assessment & Plan Note (Signed)
Chronic, controlled with levothyroxine. Will get TFTs.

## 2015-01-13 NOTE — Patient Instructions (Addendum)
Thank you for coming in today. Get labs today.  Return in 1 month fasting.  Call or go to the emergency room if you get worse, have trouble breathing, have chest pains, or palpitations.  Restart medicines.  Work on diet and exercise.   Use voltaren gel for ankle pain as needed.  Use the compression stocking.  Attend physical therapy.  Call [phone] to schedule an appointment or go to the office in this building to schedule in person.

## 2015-01-13 NOTE — Progress Notes (Signed)
[name] is a 54 y.o. female who presents to [facility] today for establish PCP care and get medication refill.    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 Scar on medial L ankle has been irritated by clothin and footwear. Tender in medial left ankle, particularly with needing to walk up inclines at work or moving laterally. Wearing a cam walker boot per recommendation from orthopedic surgeon after irritation of scar and lingering medial ankle pain.   2) Obesity: Past year has been caring for her husband who has had a stroke and she has "let things go a bit". She was working with a Physiological scientist successfully until August when she hurt herself jump roping and then had Haglan's resection. Lost 4 inches on waist previous to that. Likes flipping truck tires for exercise. Has an elliptical she is hoping to use more at her house. Also wants to buy a multi trainer equipment after her son moves out. Eats 2-3 vegetable servings on a good day. Has cut out sugar beverages from diet. Plans to go work with nutritionist for meal planning on 12/6.   3) PVD: Bifemoral bypass at age 7. Recently ran out of statin. Does not like taking atorvastatin and prefers pravastatin.   4) depression/anxiety: doing well, cites daughter is moving out soon which will be good. Son will be moving out as well, spent first thanksgiving away from her last week and she dealt with it well. Has been taking venlafaxine 48m one time daily because twice makes her groggy.    5) Health maintenance:  Patient overdue for colorectal cancer screening, mammogram and pap smear. Interested in [employer] Interested in flu shot but hesitant because had a past reaction with severe edema in arm of injection site. Former smoker. Smokes 1 pack every 6 months during relapse. Last pack over 6 months ago.   Past Medical History  Diagnosis Date  . ALLERGIC RHINITIS 04/10/2008  . DEPRESSION 08/13/2008  .  HYPERLIPIDEMIA 08/04/2006  . HYPERTENSION 08/04/2006  . HYPOTHYROIDISM 08/04/2006  . LOW BACK PAIN 08/04/2006  . PERIMENOPAUSAL SYNDROME 05/08/2008  . PVD (peripheral vascular disease) ([place]     occlusive, status post bifem bypass 2007   Past Surgical History  Procedure Laterality Date  . Cholecystectomy    . Femoral bypass      bifem.  . Hagland's deformity excision    . Tubal ligation     Social History  Substance Use Topics  . Smoking status: Former Smoker    Quit date: 02/15/2004  . Smokeless tobacco: Never Used  . Alcohol Use: No   family history includes Alcohol abuse in her brother and maternal uncle; Diabetes in her maternal grandmother; Heart disease in her father; Hyperlipidemia in her father; Hypertension in her father and mother; Stroke in her maternal uncle.  ROS as above Medications: Current Outpatient Prescriptions  Medication Sig Dispense Refill  . benazepril (LOTENSIN) 10 MG tablet Take 1 tablet (10 mg total) by mouth daily. 30 tablet 2  . fexofenadine (ALLEGRA) 180 MG tablet Take 1 tablet (180 mg total) by mouth daily. 30 tablet 12  . levothyroxine (SYNTHROID, LEVOTHROID) 100 MCG tablet Take 1 tablet (100 mcg total) by mouth daily. 30 tablet 1  . metoprolol (LOPRESSOR) 50 MG tablet Take 1 tablet (50 mg total) by mouth 2 (two) times daily. 60 tablet 1  . diclofenac sodium (VOLTAREN) 1 % GEL Apply 2 g topically 4 (four) times daily. To affected  joint. 100 g 11  . pravastatin (PRAVACHOL) 40 MG tablet Take 1 tablet (40 mg total) by mouth daily. 30 tablet 11  . venlafaxine XR (EFFEXOR XR) 75 MG 24 hr capsule Take 1 capsule (75 mg total) by mouth daily with breakfast. 30 capsule 1   No current facility-administered medications for this visit.   Allergies  Allergen Reactions  . Influenza Virus Vacc Split Pf   . Lipitor [Atorvastatin] Other (See Comments)    Dizziness.      Exam:  BP 149/71 mmHg  Pulse 62  Ht 5' 4"  (1.626 m)  Wt 219 lb (99.338 kg)  BMI 37.57  kg/m2 Gen: Well, non-toxic obese woman in  NAD HEENT: EOMI,  MMM Lungs: Normal work of breathing. CTABL no wheezes, crackles.  Heart: RRR no MRG Abd: NABS, Soft. Nondistended, Nontender Exts: Brisk capillary refill, warm and well perfused. 5cm purple hypertrophic scar vertical in medial ackle with surrounding erythema and scaling skin. Left lateral ankle tender.  Pulses intact bilateral lower extremities.  No results found for this or any previous visit (from the past 24 hour(s)). No results found.  PHQ-9: 2 GAD-7: 0  Please see individual assessment and plan sections.  Health maintenance: Cologuard HIV screening Pap smear next well visit Schedule for mammogram  Refill medications: Pravastatin, benazepril, levothyroxine, venlafaxine

## 2015-01-13 NOTE — Assessment & Plan Note (Addendum)
Chronic, controlled on benazepril on metoprolol. Will refill. Will get CMP today.

## 2015-01-14 DIAGNOSIS — E1159 Type 2 diabetes mellitus with other circulatory complications: Secondary | ICD-10-CM | POA: Insufficient documentation

## 2015-01-14 DIAGNOSIS — E118 Type 2 diabetes mellitus with unspecified complications: Secondary | ICD-10-CM | POA: Insufficient documentation

## 2015-01-14 DIAGNOSIS — E559 Vitamin D deficiency, unspecified: Secondary | ICD-10-CM | POA: Insufficient documentation

## 2015-01-14 DIAGNOSIS — E139 Other specified diabetes mellitus without complications: Secondary | ICD-10-CM | POA: Insufficient documentation

## 2015-01-14 LAB — HIV ANTIBODY (ROUTINE TESTING W REFLEX): HIV 1&2 Ab, 4th Generation: NONREACTIVE

## 2015-01-14 LAB — HEPATITIS C ANTIBODY: HCV Ab: NEGATIVE

## 2015-01-14 LAB — VITAMIN D 25 HYDROXY (VIT D DEFICIENCY, FRACTURES): VIT D 25 HYDROXY: 18 ng/mL — AB (ref 30–100)

## 2015-01-14 MED ORDER — METFORMIN HCL 500 MG PO TABS: 500.0000 mg | ORAL_TABLET | Freq: Two times a day (BID) | ORAL | Status: DC

## 2015-01-14 MED ORDER — VITAMIN D (ERGOCALCIFEROL) 1.25 MG (50000 UNIT) PO CAPS: 50000.0000 [IU] | ORAL_CAPSULE | ORAL | Status: DC

## 2015-01-14 NOTE — Addendum Note (Signed)
Addended by: Gregor Hams on: 01/14/2015 05:09 AM   Modules accepted: Orders

## 2015-01-14 NOTE — Progress Notes (Signed)
Quick Note:  1) The test shows mild diabetes. We will start metformin pill twice daily.  2) The test also shows Vit D deficiency. We will start vitamin D.  3) Thyroid test was normal.  4) Mild continued liver inflimation. ______

## 2015-01-16 ENCOUNTER — Encounter: Payer: Self-pay | Admitting: Rehabilitative and Restorative Service Providers"

## 2015-01-16 ENCOUNTER — Ambulatory Visit (INDEPENDENT_AMBULATORY_CARE_PROVIDER_SITE_OTHER): Payer: Managed Care, Other (non HMO) | Admitting: Rehabilitative and Restorative Service Providers"

## 2015-01-16 DIAGNOSIS — Z7409 Other reduced mobility: Secondary | ICD-10-CM

## 2015-01-16 DIAGNOSIS — M623 Immobility syndrome (paraplegic): Secondary | ICD-10-CM

## 2015-01-16 DIAGNOSIS — M25572 Pain in left ankle and joints of left foot: Secondary | ICD-10-CM | POA: Diagnosis not present

## 2015-01-16 DIAGNOSIS — R531 Weakness: Secondary | ICD-10-CM

## 2015-01-16 DIAGNOSIS — M2560 Stiffness of unspecified joint, not elsewhere classified: Secondary | ICD-10-CM

## 2015-01-16 NOTE — Patient Instructions (Signed)
Avoid standing with knees locked!   HIP: Hamstrings - Supine    Place strap around foot. Raise leg up, keep knee straight. Hold _30__ seconds. _3__ reps per set,_2-3__ days per week   Achilles / Gastroc, Standing    Stand, right foot behind, heel on floor and turned slightly out, leg straight, forward leg bent. Move hips forward. Hold _30__ seconds. Repeat _3__ times per session. Do _2-3__ sessions per day.  Achilles / Soleus, Standing    Stand, right foot behind, heel on floor and turned slightly out. Lower hips and bend knees. Hold _30__ seconds. Repeat __3_ times per session. Do _2-3__ sessions per day.      Without Support    Stand on left leg in neutral spine without support. Hold __30-60__ seconds.  Do __2-3__ repetitions, 2-3 times/day.    Gastroc, Sitting (Passive)    Sit with strap or towel around ball of foot. Gently pull up and out toward little toe side of foot. Hold __20-30 _ seconds.  Repeat __3_ times per session. Do __2_ sessions per day. Can continue to pull straight up and can also pull up and in

## 2015-01-16 NOTE — Therapy (Addendum)
[facility] [place], [place], [address] Phone: [phone]   Fax:  [phone]  Physical Therapy Evaluation  Patient Details  Name: [name] MRN: [id number] Date of Birth: 1960-07-05 Referring Provider: Georgina Snell   Encounter Date: 01/16/2015      PT End of Session - 01/16/15 1023    Visit Number 1   Number of Visits 12   Date for PT Re-Evaluation 02/26/14   PT Start Time 0937   PT Stop Time 1024   PT Time Calculation (min) 47 min   Activity Tolerance Patient tolerated treatment well      Past Medical History  Diagnosis Date  . ALLERGIC RHINITIS 04/10/2008  . DEPRESSION 08/13/2008  . HYPERLIPIDEMIA 08/04/2006  . HYPERTENSION 08/04/2006  . HYPOTHYROIDISM 08/04/2006  . LOW BACK PAIN 08/04/2006  . PERIMENOPAUSAL SYNDROME 05/08/2008  . PVD (peripheral vascular disease) ([place])     occlusive, status post bifem bypass 2007    Past Surgical History  Procedure Laterality Date  . Cholecystectomy    . Femoral bypass      bifem.  . Hagland's deformity excision    . Tubal ligation      There were no vitals filed for this visit.  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 She has increased scar tissue and continued pain in ankle which limits functional activity and walking.    Pertinent History Carpel tunnel syndrome Rt wrist; lumbar pain and dysfunction inc HNP   How long can you sit comfortably? 60 min    How long can you stand comfortably? 15 min    How long can you walk comfortably? 5 min    Diagnostic tests xrays    Patient Stated Goals increase movement in ankle; improve  mobility and strength; walk for longer distances    Currently in Pain? Yes   Pain Score 3    Pain Location Ankle   Pain Orientation Left   Pain Descriptors / Indicators Dull   Pain Radiating Towards lateral ankle/tendon and up calf    Pain Onset More than a month ago   Pain Frequency Intermittent   Aggravating Factors  standing; walking; on feet    Pain Relieving Factors analgesic cream; meds; elevation; ice            [facility] PT Assessment - 01/16/15 0001    Assessment   Medical Diagnosis Lt ankle pain    Referring Provider Georgina Snell    Onset Date/Surgical Date 10/07/14   Hand Dominance Right   Next MD Visit 02/11/15   Prior Therapy none   Precautions   Precautions None   Balance Screen   Has the patient fallen in the past 6 months No   Has the patient had a decrease in activity level because of a fear of falling?  No   Is the patient reluctant to leave their home because of a fear of falling?  No   Home Environment   Additional Comments multilevel some difficulty with stairs especially going down    Prior Function   Level of Independence Independent   Vocation Full time employment  Vocation Requirements desk/computer 8hr/day   Leisure household chores; cooking; exercise at home and gym but unable to do since surgery    Observation/Other Assessments   Skin Integrity incision lateral ankle ~ 2 in with hypertrophy of scar    Focus on Therapeutic Outcomes (FOTO)  52% limitation    Sensation   Additional Comments WFL's per pt report    Posture/Postural Control   Posture Comments stands with LE's ER weight shifted to the Rt    AROM   Right Ankle Dorsiflexion 10   Right Ankle Plantar Flexion 54   Right Ankle Inversion 35   Right Ankle Eversion 24   Left Ankle Dorsiflexion 6   Left Ankle Plantar Flexion 45   Left Ankle Inversion 26   Left Ankle Eversion 7   Strength   Strength Assessment Site --  5/5 bilat ankles except Lt plantal flexion 3+/5    Flexibility    Hamstrings Rt 85 deg; Lt 79 deg   ITB tight Lt > Rt    Palpation   Palpation comment tender along incision Lt ankle    Ambulation/Gait   Gait Comments decreased wt bearing Lt LE with limp                    [facility] Adult PT Treatment/Exercise - 01/16/15 0001    Ankle Exercises: Stretches   Plantar Fascia Stretch 2 reps;30 seconds   Soleus Stretch 2 reps;30 seconds   Other Stretch ankle dorsiflexion with eversion and inversion 20 sec x 2 ea   Other Stretch hamstring stretch 30 sec x3; IT band stretch 30 sec x 1    Ankle Exercises: Standing   Other Standing Ankle Exercises SLS 20-30 sec x 2                 PT Education - 01/16/15 1020    Education provided Yes   Education Details HEP; scar massage    Person(s) Educated Patient   Methods Explanation;Demonstration;Tactile cues;Verbal cues;Handout   Comprehension Verbalized understanding;Returned demonstration;Verbal cues required;Tactile cues required             PT Long Term Goals - 01/16/15 1206    PT LONG TERM GOAL #1   Title Increase ankle ROM Lt to equal Rt ankle AROM 02/27/15   Time 6   Period Weeks   Status New   PT LONG TERM GOAL #2   Title 5/5 strength Lt plantal flexion 02/27/15   Time 6   Status New   PT LONG TERM GOAL #3   Title Improve balance and agility Lt LE 02/27/15   Time 6   Period Weeks   Status New   PT LONG TERM GOAL #4   Title Ambulate for 15-20 min without increased pain 02/27/15   Time 6   Period Weeks   Status New   PT LONG TERM GOAL #5   Title Improve FOTO to </+ 41% limitation 02/27/15   Time 6   Period Weeks   Status New               Plan - 01/16/15 1158    Clinical Impression Statement [name] presents s/p bone spur removal Lt calcaneus 10/07/14. She has conintued pain; limited ROM; decreased strength; decreased balance and gait; limited functional activity. Pt will benefit form PT to address areas of limitation identified.    Rehab Potential Good   PT  Frequency 2x / week   PT Duration 6 weeks   PT Treatment/Interventions  Patient/family education;ADLs/Self Care Home Management;Therapeutic activities;Therapeutic exercise;Manual techniques;Dry needling;Moist Heat;Electrical Stimulation;Cryotherapy;Ultrasound   PT Next Visit Plan Progress with balance activities; gradual strengthening Lt plantar flexion; modalities as indicated   PT Home Exercise Plan HEP    Consulted and Agree with Plan of Care Patient         Problem List Patient Active Problem List   Diagnosis Date Noted  . Type 2 diabetes mellitus with vascular disease ([place]) 01/14/2015  . Vitamin D deficiency 01/14/2015  . Left ankle pain 01/13/2015  . Obesity 01/13/2015  . Glucose intolerance (impaired glucose tolerance) 03/29/2013  . Fatty liver disease, nonalcoholic 38/11/1749  . Peripheral vascular disease ([place]) 07/15/2011  . DEPRESSION 08/13/2008  . PERIMENOPAUSAL SYNDROME 05/08/2008  . ALLERGIC RHINITIS 04/10/2008  . Hypothyroidism 08/04/2006  . HYPERLIPIDEMIA 08/04/2006  . Essential hypertension 08/04/2006  . LOW BACK PAIN 08/04/2006    Leroi Haque Nilda Simmer PT, MPH  01/16/2015, 12:11 PM  [facility] [place], [place], [address] Phone: [phone]   Fax:  [phone]  Name: CHALEE HIROTA MRN: [id number] Date of Birth: 1960/03/14   PHYSICAL THERAPY DISCHARGE SUMMARY  Visits from Start of Care: 1  Current functional level related to goals / functional outcomes: unchanged   Remaining deficits: unchanged   Education / Equipment: HEP  Plan: Patient agrees to discharge.  Patient goals were not met. Patient is being discharged due to being pleased with the current functional level.  ?????    Amontae Ng P. Helene Kelp PT, MPH 02/04/2015 8:16 AM

## 2015-01-23 ENCOUNTER — Encounter: Payer: Managed Care, Other (non HMO) | Admitting: Rehabilitative and Restorative Service Providers"

## 2015-01-27 LAB — BASIC METABOLIC PANEL
CREATININE: 0.8 mg/dL (ref 0.5–1.1)
POTASSIUM: 4.3 mmol/L (ref 3.4–5.3)
SODIUM: 139 mmol/L (ref 137–147)

## 2015-01-27 LAB — LIPID PANEL
CHOL/HDL RATIO: 3.4
CHOLESTEROL: 166 mg/dL (ref 0–200)
HDL: 49 mg/dL (ref 35–70)
LDL Cholesterol: 112 mg/dL
NON-HDL CHOLESTEROL (CARDIO IQ ADV LIPID PANEL): 117
Triglycerides: 138 mg/dL (ref 40–160)

## 2015-01-27 LAB — HEPATIC FUNCTION PANEL
ALT: 39 U/L — AB (ref 7–35)
AST: 34 U/L (ref 13–35)
Alkaline Phosphatase: 138 U/L — AB (ref 25–125)

## 2015-01-27 LAB — HEMOGLOBIN A1C: Hemoglobin A1C: 6.7

## 2015-01-30 ENCOUNTER — Encounter: Payer: Managed Care, Other (non HMO) | Admitting: Rehabilitative and Restorative Service Providers"

## 2015-02-04 ENCOUNTER — Ambulatory Visit (INDEPENDENT_AMBULATORY_CARE_PROVIDER_SITE_OTHER): Payer: Managed Care, Other (non HMO)

## 2015-02-04 DIAGNOSIS — Z1231 Encounter for screening mammogram for malignant neoplasm of breast: Secondary | ICD-10-CM | POA: Diagnosis not present

## 2015-02-04 DIAGNOSIS — Z1239 Encounter for other screening for malignant neoplasm of breast: Secondary | ICD-10-CM

## 2015-02-05 ENCOUNTER — Encounter: Payer: Self-pay | Admitting: *Deleted

## 2015-02-05 NOTE — Progress Notes (Signed)
Quick Note:  Mamogran is normal. ______

## 2015-02-06 ENCOUNTER — Encounter: Payer: Managed Care, Other (non HMO) | Admitting: Rehabilitative and Restorative Service Providers"

## 2015-02-09 ENCOUNTER — Other Ambulatory Visit: Payer: Self-pay | Admitting: Family Medicine

## 2015-02-11 ENCOUNTER — Ambulatory Visit (INDEPENDENT_AMBULATORY_CARE_PROVIDER_SITE_OTHER): Payer: Managed Care, Other (non HMO) | Admitting: Family Medicine

## 2015-02-11 ENCOUNTER — Encounter: Payer: Self-pay | Admitting: Family Medicine

## 2015-02-11 VITALS — BP 126/76 | HR 72 | Wt 214.0 lb

## 2015-02-11 DIAGNOSIS — I10 Essential (primary) hypertension: Secondary | ICD-10-CM | POA: Diagnosis not present

## 2015-02-11 DIAGNOSIS — K760 Fatty (change of) liver, not elsewhere classified: Secondary | ICD-10-CM

## 2015-02-11 DIAGNOSIS — M25572 Pain in left ankle and joints of left foot: Secondary | ICD-10-CM

## 2015-02-11 DIAGNOSIS — Z23 Encounter for immunization: Secondary | ICD-10-CM | POA: Diagnosis not present

## 2015-02-11 DIAGNOSIS — E669 Obesity, unspecified: Secondary | ICD-10-CM

## 2015-02-11 DIAGNOSIS — E1159 Type 2 diabetes mellitus with other circulatory complications: Secondary | ICD-10-CM | POA: Diagnosis not present

## 2015-02-11 DIAGNOSIS — E785 Hyperlipidemia, unspecified: Secondary | ICD-10-CM | POA: Diagnosis not present

## 2015-02-11 DIAGNOSIS — E559 Vitamin D deficiency, unspecified: Secondary | ICD-10-CM

## 2015-02-11 MED ORDER — METFORMIN HCL ER 750 MG PO TB24: 750.0000 mg | ORAL_TABLET | Freq: Every day | ORAL | Status: DC

## 2015-02-11 NOTE — Assessment & Plan Note (Signed)
Outside labs reviewed, transaminitis improving.

## 2015-02-11 NOTE — Assessment & Plan Note (Signed)
Improving based on patient and exam.

## 2015-02-11 NOTE — Assessment & Plan Note (Signed)
Patient feels much better citing less diarrhea and overall feeling of well being.

## 2015-02-11 NOTE — Assessment & Plan Note (Signed)
Lipids improved. Continue pravastatin. Recheck in a few months.

## 2015-02-11 NOTE — Progress Notes (Signed)
[name] is a 54 y.o. female who presents to [facility] today for follow up diabetes, HLD, HTN, obesity and health maintenance.   1) diabetes: Patient was drowsy with morning metformin dose, only been taking it once in the evening. Recent A1C through outside blood work of 6.7. Has been several diet changes and lost 5 lbs. Highly motivated to come off medication.   2) HTN: Well controlled today. Taking medication regualrly and tolerating well.  3) HLD: Tolerating pravastatin and taking regularly  4) Obesity: Starting weight loss program [facility] this week. Will have regular bloodwork which she will send to [place]. Highly motivated to lose weight.   5) HM: Up to date on pap, had one 2-3 years ago and was NL> Has not done Cologuard but plans to do it this week. Got a mammogram recently and was normal. Foot is improving. Vit D has made her feel much better.   Dropped something heavy on foot. Able to walk and has full ROM. Otherwise achilles issue improving.   Past Medical History  Diagnosis Date  . ALLERGIC RHINITIS 04/10/2008  . DEPRESSION 08/13/2008  . HYPERLIPIDEMIA 08/04/2006  . HYPERTENSION 08/04/2006  . HYPOTHYROIDISM 08/04/2006  . LOW BACK PAIN 08/04/2006  . PERIMENOPAUSAL SYNDROME 05/08/2008  . PVD (peripheral vascular disease) ([place])     occlusive, status post bifem bypass 2007   Past Surgical History  Procedure Laterality Date  . Cholecystectomy    . Femoral bypass      bifem.  . Hagland's deformity excision    . Tubal ligation     Social History  Substance Use Topics  . Smoking status: Former Smoker    Quit date: 02/15/2004  . Smokeless tobacco: Never Used  . Alcohol Use: No   family history includes Alcohol abuse in her brother and maternal uncle; Diabetes in her maternal grandmother; Heart disease in her father; Hyperlipidemia in her father; Hypertension in her  father and mother; Stroke in her maternal uncle.  ROS as above Medications: Current Outpatient Prescriptions  Medication Sig Dispense Refill  . benazepril (LOTENSIN) 10 MG tablet Take 1 tablet (10 mg total) by mouth daily. 30 tablet 2  . diclofenac sodium (VOLTAREN) 1 % GEL Apply 2 g topically 4 (four) times daily. To affected joint. 100 g 11  . fexofenadine (ALLEGRA) 180 MG tablet Take 1 tablet (180 mg total) by mouth daily. 30 tablet 12  . levothyroxine (SYNTHROID, LEVOTHROID) 100 MCG tablet Take 1 tablet (100 mcg total) by mouth daily. 30 tablet 1  . metoprolol (LOPRESSOR) 50 MG tablet Take 1 tablet (50 mg total) by mouth 2 (two) times daily. 60 tablet 1  . pravastatin (PRAVACHOL) 40 MG tablet Take 1 tablet (40 mg total) by mouth daily. 30 tablet 11  . venlafaxine XR (EFFEXOR XR) 75 MG 24 hr capsule Take 1 capsule (75 mg total) by mouth daily with breakfast. 30 capsule 1  . Vitamin D, Ergocalciferol, (DRISDOL) 50000 UNITS CAPS capsule Take 1 capsule (50,000 Units total) by mouth every 7 (seven) days. Take for 8 total doses(weeks) 8 capsule 0  . metFORMIN (GLUCOPHAGE XR) 750 MG 24 hr tablet Take 1 tablet (750 mg total) by mouth at bedtime. 90 tablet 2   No current facility-administered medications for this visit.   Allergies  Allergen Reactions  . Influenza Virus Vacc Split Pf   . Lipitor [Atorvastatin] Other (See Comments)    Dizziness.  Exam:  BP 126/76 mmHg  Pulse 72  Wt 214 lb (97.07 kg) Gen: Well appearing obese woman in NAD HEENT: EOMI,  MMM Lungs: Normal work of breathing. CTABL no wheezes crackles Heart: RRR no MRG Abd: NABS, Soft. Nondistended, Nontender Exts: Brisk capillary refill, warm and well perfused.  MSK: Left foot slightly erythematous, well healing ecchymoses    23 valent polysaccharide pneumonia vaccine given today  No results found for this or any previous visit (from the past 24 hour(s)). No results found.   Please see individual assessment and  plan sections.

## 2015-02-11 NOTE — Assessment & Plan Note (Signed)
Chronic, improving. Starting weight loss management program at [facility] this week.

## 2015-02-11 NOTE — Patient Instructions (Signed)
Thank you for coming in today. You were seen in follow up for Diabetes, high cholesterol, hypertension. You are doing very well. Please continue taking your medications.  We will change your diabetes medication to an extended release version.  Please follow up with a ophthalmologist for a diabetic eye exam which is important to prevent Please complete your Cologuard screening as soon as possible. Please determine when your last pap smear is, you will be due for another in 5 years.  Please have your new weight management program forward reports to [place] or bring them to your next visit if possible.  Return to care in 3 months.

## 2015-02-11 NOTE — Assessment & Plan Note (Addendum)
Chronic, improving. Will change to Metformin 750  XL given improving A1C, dietary changes on 500 daily metformin. Will take at night. Will schedule appointment for ophthalmologist herself.

## 2015-02-11 NOTE — Assessment & Plan Note (Signed)
>>  ASSESSMENT AND PLAN FOR TYPE 2 DIABETES MELLITUS WITH COMPLICATIONS ([facility]) WRITTEN ON 02/11/2015  9:46 AM BY VARUNOK, NICOLAS, MEDICAL STUDENT  Chronic, improving. Will change to Metformin  750  XL given improving A1C, dietary changes on 500 daily metformin . Will take at night. Will schedule appointment for ophthalmologist herself.

## 2015-02-11 NOTE — Assessment & Plan Note (Signed)
Chronic, well controlled today.

## 2015-02-12 DIAGNOSIS — Z95828 Presence of other vascular implants and grafts: Secondary | ICD-10-CM | POA: Insufficient documentation

## 2015-02-12 DIAGNOSIS — E139 Other specified diabetes mellitus without complications: Secondary | ICD-10-CM | POA: Insufficient documentation

## 2015-03-10 ENCOUNTER — Encounter: Payer: Self-pay | Admitting: Family Medicine

## 2015-03-10 ENCOUNTER — Ambulatory Visit (INDEPENDENT_AMBULATORY_CARE_PROVIDER_SITE_OTHER): Payer: Managed Care, Other (non HMO) | Admitting: Family Medicine

## 2015-03-10 VITALS — BP 135/77 | HR 75 | Temp 98.3°F | Ht 65.0 in | Wt 209.0 lb

## 2015-03-10 DIAGNOSIS — J0101 Acute recurrent maxillary sinusitis: Secondary | ICD-10-CM | POA: Diagnosis not present

## 2015-03-10 DIAGNOSIS — J329 Chronic sinusitis, unspecified: Secondary | ICD-10-CM | POA: Insufficient documentation

## 2015-03-10 MED ORDER — AZITHROMYCIN 250 MG PO TABS: 250.0000 mg | ORAL_TABLET | Freq: Every day | ORAL | Status: DC

## 2015-03-10 MED ORDER — IPRATROPIUM BROMIDE 0.06 % NA SOLN: 2.0000 | Freq: Four times a day (QID) | NASAL | Status: DC

## 2015-03-10 MED ORDER — PREDNISONE 10 MG PO TABS: 30.0000 mg | ORAL_TABLET | Freq: Every day | ORAL | Status: DC

## 2015-03-10 NOTE — Assessment & Plan Note (Signed)
Treat with prednisone and atrovent nasal spray.  Use azithromycin if not better.

## 2015-03-10 NOTE — Patient Instructions (Signed)
Thank you for coming in today. Return if not better.  Call or go to the emergency room if you get worse, have trouble breathing, have chest pains, or palpitations.   Sinusitis, Adult Sinusitis is redness, soreness, and inflammation of the paranasal sinuses. Paranasal sinuses are air pockets within the bones of your face. They are located beneath your eyes, in the middle of your forehead, and above your eyes. In healthy paranasal sinuses, mucus is able to drain out, and air is able to circulate through them by way of your nose. However, when your paranasal sinuses are inflamed, mucus and air can become trapped. This can allow bacteria and other germs to grow and cause infection. Sinusitis can develop quickly and last only a short time (acute) or continue over a long period (chronic). Sinusitis that lasts for more than 12 weeks is considered chronic. CAUSES Causes of sinusitis include:  Allergies.  Structural abnormalities, such as displacement of the cartilage that separates your nostrils (deviated septum), which can decrease the air flow through your nose and sinuses and affect sinus drainage.  Functional abnormalities, such as when the small hairs (cilia) that line your sinuses and help remove mucus do not work properly or are not present. SIGNS AND SYMPTOMS Symptoms of acute and chronic sinusitis are the same. The primary symptoms are pain and pressure around the affected sinuses. Other symptoms include:  Upper toothache.  Earache.  Headache.  Bad breath.  Decreased sense of smell and taste.  A cough, which worsens when you are lying flat.  Fatigue.  Fever.  Thick drainage from your nose, which often is green and may contain pus (purulent).  Swelling and warmth over the affected sinuses. DIAGNOSIS Your health care provider will perform a physical exam. During your exam, your health care provider may perform any of the following to help determine if you have acute sinusitis or  chronic sinusitis:  Look in your nose for signs of abnormal growths in your nostrils (nasal polyps).  Tap over the affected sinus to check for signs of infection.  View the inside of your sinuses using an imaging device that has a light attached (endoscope). If your health care provider suspects that you have chronic sinusitis, one or more of the following tests may be recommended:  Allergy tests.  Nasal culture. A sample of mucus is taken from your nose, sent to a lab, and screened for bacteria.  Nasal cytology. A sample of mucus is taken from your nose and examined by your health care provider to determine if your sinusitis is related to an allergy. TREATMENT Most cases of acute sinusitis are related to a viral infection and will resolve on their own within 10 days. Sometimes, medicines are prescribed to help relieve symptoms of both acute and chronic sinusitis. These may include pain medicines, decongestants, nasal steroid sprays, or saline sprays. However, for sinusitis related to a bacterial infection, your health care provider will prescribe antibiotic medicines. These are medicines that will help kill the bacteria causing the infection. Rarely, sinusitis is caused by a fungal infection. In these cases, your health care provider will prescribe antifungal medicine. For some cases of chronic sinusitis, surgery is needed. Generally, these are cases in which sinusitis recurs more than 3 times per year, despite other treatments. HOME CARE INSTRUCTIONS  Drink plenty of water. Water helps thin the mucus so your sinuses can drain more easily.  Use a humidifier.  Inhale steam 3-4 times a day (for example, sit in the bathroom  with the shower running).  Apply a warm, moist washcloth to your face 3-4 times a day, or as directed by your health care provider.  Use saline nasal sprays to help moisten and clean your sinuses.  Take medicines only as directed by your health care provider.  If  you were prescribed either an antibiotic or antifungal medicine, finish it all even if you start to feel better. SEEK IMMEDIATE MEDICAL CARE IF:  You have increasing pain or severe headaches.  You have nausea, vomiting, or drowsiness.  You have swelling around your face.  You have vision problems.  You have a stiff neck.  You have difficulty breathing.   This information is not intended to replace advice given to you by your health care provider. Make sure you discuss any questions you have with your health care provider.   Document Released: 01/31/2005 Document Revised: 02/21/2014 Document Reviewed: 02/15/2011 Elsevier Interactive Patient Education [employer].

## 2015-03-10 NOTE — Progress Notes (Signed)
[name] is a 55 y.o. female who presents to [facility]: [facility] today for his pain and pressure associated with discharge and sore throat. Symptoms present for a few days. No fevers chills nausea vomiting or diarrhea. Patient has tried over-the-counter medicines which help. Symptoms are consistent with previous episodes of bacterial sinus infections.   Past Medical History  Diagnosis Date  . ALLERGIC RHINITIS 04/10/2008  . DEPRESSION 08/13/2008  . HYPERLIPIDEMIA 08/04/2006  . HYPERTENSION 08/04/2006  . HYPOTHYROIDISM 08/04/2006  . LOW BACK PAIN 08/04/2006  . PERIMENOPAUSAL SYNDROME 05/08/2008  . PVD (peripheral vascular disease) ([place])     occlusive, status post bifem bypass 2007   Past Surgical History  Procedure Laterality Date  . Cholecystectomy    . Femoral bypass      bifem.  . Hagland's deformity excision    . Tubal ligation     Social History  Substance Use Topics  . Smoking status: Former Smoker    Quit date: 02/15/2004  . Smokeless tobacco: Never Used  . Alcohol Use: No   family history includes Alcohol abuse in her brother and maternal uncle; Diabetes in her maternal grandmother; Heart disease in her father; Hyperlipidemia in her father; Hypertension in her father and mother; Stroke in her maternal uncle.  ROS as above Medications: Current Outpatient Prescriptions  Medication Sig Dispense Refill  . benazepril (LOTENSIN) 10 MG tablet Take 1 tablet (10 mg total) by mouth daily. 30 tablet 2  . diclofenac sodium (VOLTAREN) 1 % GEL Apply 2 g topically 4 (four) times daily. To affected joint. 100 g 11  . fexofenadine (ALLEGRA) 180 MG tablet Take 1 tablet (180 mg total) by mouth daily. 30 tablet 12  . levothyroxine (SYNTHROID, LEVOTHROID) 100 MCG tablet Take 1 tablet (100 mcg total) by mouth daily. 30 tablet 1  . metFORMIN (GLUCOPHAGE XR) 750 MG 24 hr tablet Take 1 tablet (750  mg total) by mouth at bedtime. 90 tablet 2  . metoprolol (LOPRESSOR) 50 MG tablet Take 1 tablet (50 mg total) by mouth 2 (two) times daily. 60 tablet 1  . pravastatin (PRAVACHOL) 40 MG tablet Take 1 tablet (40 mg total) by mouth daily. 30 tablet 11  . venlafaxine XR (EFFEXOR XR) 75 MG 24 hr capsule Take 1 capsule (75 mg total) by mouth daily with breakfast. 30 capsule 1  . Vitamin D, Ergocalciferol, (DRISDOL) 50000 UNITS CAPS capsule Take 1 capsule (50,000 Units total) by mouth every 7 (seven) days. Take for 8 total doses(weeks) 8 capsule 0  . azithromycin (ZITHROMAX) 250 MG tablet Take 1 tablet (250 mg total) by mouth daily. Take first 2 tablets together, then 1 every day until finished. 6 tablet 0  . ipratropium (ATROVENT) 0.06 % nasal spray Place 2 sprays into both nostrils 4 (four) times daily. 15 mL 1  . predniSONE (DELTASONE) 10 MG tablet Take 3 tablets (30 mg total) by mouth daily. 15 tablet 0   No current facility-administered medications for this visit.   Allergies  Allergen Reactions  . Influenza Virus Vacc Split Pf   . Lipitor [Atorvastatin] Other (See Comments)    Dizziness.      Exam:  BP 135/77 mmHg  Pulse 75  Temp(Src) 98.3 F (36.8 C) (Oral)  Ht 5' 5"  (1.651 m)  Wt 209 lb (94.802 kg)  BMI 34.78 kg/m2 Gen: Well NAD HEENT: EOMI,  MMM . Pharynx with cobblestoning. Normal tympanic members bilaterally. Clear nasal discharge. Tender to  palpation bilateral maxillary sinuses.  Lungs: Normal work of breathing. CTABL Heart: RRR no MRG Abd: NABS, Soft. Nondistended, Nontender Exts: Brisk capillary refill, warm and well perfused.   No results found for this or any previous visit (from the past 24 hour(s)). No results found.   Please see individual assessment and plan sections.

## 2015-03-16 ENCOUNTER — Other Ambulatory Visit: Payer: Self-pay | Admitting: Family Medicine

## 2015-03-17 ENCOUNTER — Encounter: Payer: Self-pay | Admitting: Family Medicine

## 2015-03-17 ENCOUNTER — Telehealth: Payer: Self-pay

## 2015-03-17 MED ORDER — AMOXICILLIN-POT CLAVULANATE 875-125 MG PO TABS: 1.0000 | ORAL_TABLET | Freq: Two times a day (BID) | ORAL | Status: DC

## 2015-03-17 NOTE — Telephone Encounter (Signed)
Patient advised.

## 2015-03-17 NOTE — Telephone Encounter (Signed)
[name] called and reports she is not feeling much better than she was last week. She still has pressure in the right side of face and congestion with yellow drainage. Denies fever, chills or sweats. She has finished the Zpack. Please advise next step.

## 2015-03-17 NOTE — Telephone Encounter (Signed)
Rx augmentin. Return if not better

## 2015-04-19 ENCOUNTER — Other Ambulatory Visit: Payer: Self-pay | Admitting: Family Medicine

## 2015-04-20 ENCOUNTER — Other Ambulatory Visit: Payer: Self-pay | Admitting: Family Medicine

## 2015-05-12 ENCOUNTER — Ambulatory Visit (INDEPENDENT_AMBULATORY_CARE_PROVIDER_SITE_OTHER): Payer: Managed Care, Other (non HMO) | Admitting: Family Medicine

## 2015-05-12 ENCOUNTER — Encounter: Payer: Self-pay | Admitting: Family Medicine

## 2015-05-12 VITALS — BP 137/69 | HR 65 | Wt 209.0 lb

## 2015-05-12 DIAGNOSIS — B379 Candidiasis, unspecified: Secondary | ICD-10-CM | POA: Diagnosis not present

## 2015-05-12 DIAGNOSIS — E119 Type 2 diabetes mellitus without complications: Secondary | ICD-10-CM | POA: Diagnosis not present

## 2015-05-12 DIAGNOSIS — E1159 Type 2 diabetes mellitus with other circulatory complications: Secondary | ICD-10-CM | POA: Diagnosis not present

## 2015-05-12 LAB — POCT GLYCOSYLATED HEMOGLOBIN (HGB A1C): HEMOGLOBIN A1C: 6.1

## 2015-05-12 MED ORDER — FLUCONAZOLE 150 MG PO TABS: ORAL_TABLET | ORAL | Status: DC

## 2015-05-12 MED ORDER — METFORMIN HCL 500 MG PO TABS: 500.0000 mg | ORAL_TABLET | Freq: Two times a day (BID) | ORAL | Status: DC

## 2015-05-12 NOTE — Assessment & Plan Note (Signed)
Treatment with fluconazole.

## 2015-05-12 NOTE — Assessment & Plan Note (Signed)
>>  ASSESSMENT AND PLAN FOR TYPE 2 DIABETES MELLITUS WITH COMPLICATIONS ([facility]) WRITTEN ON 05/12/2015  9:26 AM BY JOANE ARTIST RAMAN, MD  Doing well. Change metformin  to 500 twice daily. Recheck in 3 months

## 2015-05-12 NOTE — Progress Notes (Signed)
[name] is a 55 y.o. female who presents to [facility]: [facility] today for follow-up diabetes.  Patient has done well in the last 3 months regarding her diabetes. She denies any polyuria or polydipsia. She noted she like to switch to twice daily metformin instead of once daily. Otherwise she feels quite well.  Additionally she notes a recent yeast infection. She is mostly treated this with over-the-counter treatment but would like a prescription of possible. She's not had a yeast infection for some time now. She notes mild vaginal itching. She feels well otherwise.   Past Medical History  Diagnosis Date  . ALLERGIC RHINITIS 04/10/2008  . DEPRESSION 08/13/2008  . HYPERLIPIDEMIA 08/04/2006  . HYPERTENSION 08/04/2006  . HYPOTHYROIDISM 08/04/2006  . LOW BACK PAIN 08/04/2006  . PERIMENOPAUSAL SYNDROME 05/08/2008  . PVD (peripheral vascular disease) ([place])     occlusive, status post bifem bypass 2007   Past Surgical History  Procedure Laterality Date  . Cholecystectomy    . Femoral bypass      bifem.  . Hagland's deformity excision    . Tubal ligation     Social History  Substance Use Topics  . Smoking status: Former Smoker    Quit date: 02/15/2004  . Smokeless tobacco: Never Used  . Alcohol Use: No   family history includes Alcohol abuse in her brother and maternal uncle; Diabetes in her maternal grandmother; Heart disease in her father; Hyperlipidemia in her father; Hypertension in her father and mother; Stroke in her maternal uncle.  ROS as above Medications: Current Outpatient Prescriptions  Medication Sig Dispense Refill  . benazepril (LOTENSIN) 10 MG tablet TAKE 1 TABLET(10 MG) BY MOUTH DAILY 30 tablet 0  . diclofenac sodium (VOLTAREN) 1 % GEL Apply 2 g topically 4 (four) times daily. To affected joint. 100 g 11  . fexofenadine (ALLEGRA) 180 MG tablet Take 1 tablet (180 mg total)  by mouth daily. 30 tablet 12  . levothyroxine (SYNTHROID, LEVOTHROID) 100 MCG tablet TAKE 1 TABLET(100 MCG) BY MOUTH DAILY 30 tablet 0  . metoprolol (LOPRESSOR) 50 MG tablet TAKE 1 TABLET(50 MG) BY MOUTH TWICE DAILY 60 tablet 0  . pravastatin (PRAVACHOL) 40 MG tablet Take 1 tablet (40 mg total) by mouth daily. 30 tablet 11  . venlafaxine XR (EFFEXOR-XR) 75 MG 24 hr capsule TAKE 1 CAPSULE(75 MG) BY MOUTH DAILY WITH BREAKFAST 30 capsule 4  . Vitamin D, Ergocalciferol, (DRISDOL) 50000 UNITS CAPS capsule Take 1 capsule (50,000 Units total) by mouth every 7 (seven) days. Take for 8 total doses(weeks) 8 capsule 0  . fluconazole (DIFLUCAN) 150 MG tablet Take once. May repeat in 1 week if yeast infection symptoms still present. 2 tablet 4  . metFORMIN (GLUCOPHAGE) 500 MG tablet Take 1 tablet (500 mg total) by mouth 2 (two) times daily with a meal. 180 tablet 3   No current facility-administered medications for this visit.   Allergies  Allergen Reactions  . Influenza Virus Vacc Split Pf   . Lipitor [Atorvastatin] Other (See Comments)    Dizziness.      Exam:  BP 137/69 mmHg  Pulse 65  Wt 209 lb (94.802 kg) Gen: Well NAD HEENT: EOMI,  MMM Lungs: Normal work of breathing. CTABL Heart: RRR no MRG Abd: NABS, Soft. Nondistended, Nontender Exts: Brisk capillary refill, warm and well perfused.  Vaginal exam deferred  Results for orders placed or performed in visit on 05/12/15 (from the past 24 hour(s))  POCT HgB A1C     Status: None   Collection Time: 05/12/15  8:48 AM  Result Value Ref Range   Hemoglobin A1C 6.1    No results found.   Please see individual assessment and plan sections.

## 2015-05-12 NOTE — Patient Instructions (Signed)
Thank you for coming in today. Get your eyes checked.  Send in cologuard test.  Return in 3 months.

## 2015-05-12 NOTE — Assessment & Plan Note (Signed)
Doing well. Change metformin to 500 twice daily. Recheck in 3 months

## 2015-05-19 ENCOUNTER — Other Ambulatory Visit: Payer: Self-pay | Admitting: Family Medicine

## 2015-08-12 ENCOUNTER — Encounter: Payer: Self-pay | Admitting: Family Medicine

## 2015-08-12 ENCOUNTER — Ambulatory Visit (INDEPENDENT_AMBULATORY_CARE_PROVIDER_SITE_OTHER): Payer: PRIVATE HEALTH INSURANCE | Admitting: Family Medicine

## 2015-08-12 VITALS — BP 129/84 | HR 67 | Wt 206.0 lb

## 2015-08-12 DIAGNOSIS — E785 Hyperlipidemia, unspecified: Secondary | ICD-10-CM | POA: Diagnosis not present

## 2015-08-12 DIAGNOSIS — E1159 Type 2 diabetes mellitus with other circulatory complications: Secondary | ICD-10-CM

## 2015-08-12 DIAGNOSIS — I10 Essential (primary) hypertension: Secondary | ICD-10-CM

## 2015-08-12 DIAGNOSIS — E119 Type 2 diabetes mellitus without complications: Secondary | ICD-10-CM

## 2015-08-12 LAB — POCT GLYCOSYLATED HEMOGLOBIN (HGB A1C): HEMOGLOBIN A1C: 6.2

## 2015-08-12 MED ORDER — PRAVASTATIN SODIUM 40 MG PO TABS: 40.0000 mg | ORAL_TABLET | Freq: Every day | ORAL | Status: DC

## 2015-08-12 NOTE — Progress Notes (Signed)
[name] is a 55 y.o. female who presents to [place]: [facility] today for   1) diabetes: Doing well with metformin. She has modified her diet and uses a low carbohydrate low gluten diet and feels great. She has lost some weight. No polyuria or polydipsia  2) hypertension: Doing well with benazepril. Additionally she takes Lopressor. No chest pains palpitations or shortness of breath  3) hyperlipidemia: Patient needs refill of pravastatin she feels well and tolerates medication well.   Past Medical History  Diagnosis Date  . ALLERGIC RHINITIS 04/10/2008  . DEPRESSION 08/13/2008  . HYPERLIPIDEMIA 08/04/2006  . HYPERTENSION 08/04/2006  . HYPOTHYROIDISM 08/04/2006  . LOW BACK PAIN 08/04/2006  . PERIMENOPAUSAL SYNDROME 05/08/2008  . PVD (peripheral vascular disease) ([place])     occlusive, status post bifem bypass 2007   Past Surgical History  Procedure Laterality Date  . Cholecystectomy    . Femoral bypass      bifem.  . Hagland's deformity excision    . Tubal ligation     Social History  Substance Use Topics  . Smoking status: Former Smoker    Quit date: 02/15/2004  . Smokeless tobacco: Never Used  . Alcohol Use: No   family history includes Alcohol abuse in her brother and maternal uncle; Diabetes in her maternal grandmother; Heart disease in her father; Hyperlipidemia in her father; Hypertension in her father and mother; Stroke in her maternal uncle.  ROS as above:  Medications: Current Outpatient Prescriptions  Medication Sig Dispense Refill  . benazepril (LOTENSIN) 10 MG tablet TAKE 1 TABLET(10 MG) BY MOUTH DAILY 30 tablet 3  . diclofenac sodium (VOLTAREN) 1 % GEL Apply 2 g topically 4 (four) times daily. To affected joint. 100 g 11  . fexofenadine (ALLEGRA) 180 MG tablet Take 1 tablet (180 mg total) by mouth daily. 30 tablet 12  . fluconazole (DIFLUCAN) 150  MG tablet Take once. May repeat in 1 week if yeast infection symptoms still present. 2 tablet 4  . levothyroxine (SYNTHROID, LEVOTHROID) 100 MCG tablet TAKE 1 TABLET(100 MCG) BY MOUTH DAILY 30 tablet 3  . metFORMIN (GLUCOPHAGE) 500 MG tablet Take 1 tablet (500 mg total) by mouth 2 (two) times daily with a meal. 180 tablet 3  . metoprolol (LOPRESSOR) 50 MG tablet TAKE 1 TABLET(50 MG) BY MOUTH TWICE DAILY 60 tablet 3  . pravastatin (PRAVACHOL) 40 MG tablet Take 1 tablet (40 mg total) by mouth daily. 30 tablet 11  . venlafaxine XR (EFFEXOR-XR) 75 MG 24 hr capsule TAKE 1 CAPSULE(75 MG) BY MOUTH DAILY WITH BREAKFAST 30 capsule 4  . Vitamin D, Ergocalciferol, (DRISDOL) 50000 UNITS CAPS capsule Take 1 capsule (50,000 Units total) by mouth every 7 (seven) days. Take for 8 total doses(weeks) 8 capsule 0   No current facility-administered medications for this visit.   Allergies  Allergen Reactions  . Influenza Virus Vacc Split Pf   . Lipitor [Atorvastatin] Other (See Comments)    Dizziness.      Exam:  BP 129/84 mmHg  Pulse 67  Wt 206 lb (93.441 kg) Gen: Well NAD HEENT: EOMI,  MMM Lungs: Normal work of breathing. CTABL Heart: RRR no MRG Abd: NABS, Soft. Nondistended, Nontender Exts: Brisk capillary refill, warm and well perfused.   Results for orders placed or performed in visit on 08/12/15 (from the past 24 hour(s))  POCT HgB A1C     Status: None   Collection Time: 08/12/15  9:17 AM  Result Value Ref Range   Hemoglobin A1C 6.2    No results found.    Assessment and Plan: 55 y.o. female with  1) diabetes doing well continue current regimen 2) hypertension: Doing well continue current regimen  3) hyperlipidemia: Doing well continue pravastatin  Recheck in 3 months  Discussed warning signs or symptoms. Please see discharge instructions. Patient expresses understanding.

## 2015-08-12 NOTE — Patient Instructions (Signed)
Thank you for coming in today. Keep doing what you are doing.  Return in 3 months.

## 2015-08-25 ENCOUNTER — Other Ambulatory Visit: Payer: Self-pay | Admitting: Family Medicine

## 2015-09-25 ENCOUNTER — Other Ambulatory Visit: Payer: Self-pay | Admitting: Family Medicine

## 2015-10-26 ENCOUNTER — Other Ambulatory Visit: Payer: Self-pay | Admitting: Family Medicine

## 2015-11-12 ENCOUNTER — Ambulatory Visit: Payer: PRIVATE HEALTH INSURANCE | Admitting: Family Medicine

## 2015-11-13 ENCOUNTER — Other Ambulatory Visit: Payer: Self-pay | Admitting: Family Medicine

## 2015-11-17 ENCOUNTER — Encounter: Payer: Self-pay | Admitting: Family Medicine

## 2015-11-17 ENCOUNTER — Ambulatory Visit (INDEPENDENT_AMBULATORY_CARE_PROVIDER_SITE_OTHER): Payer: PRIVATE HEALTH INSURANCE | Admitting: Family Medicine

## 2015-11-17 VITALS — BP 137/88 | HR 87 | Wt 198.0 lb

## 2015-11-17 DIAGNOSIS — E559 Vitamin D deficiency, unspecified: Secondary | ICD-10-CM

## 2015-11-17 DIAGNOSIS — G8929 Other chronic pain: Secondary | ICD-10-CM

## 2015-11-17 DIAGNOSIS — I739 Peripheral vascular disease, unspecified: Secondary | ICD-10-CM | POA: Diagnosis not present

## 2015-11-17 DIAGNOSIS — E1159 Type 2 diabetes mellitus with other circulatory complications: Secondary | ICD-10-CM | POA: Diagnosis not present

## 2015-11-17 DIAGNOSIS — E890 Postprocedural hypothyroidism: Secondary | ICD-10-CM

## 2015-11-17 DIAGNOSIS — I10 Essential (primary) hypertension: Secondary | ICD-10-CM

## 2015-11-17 DIAGNOSIS — K760 Fatty (change of) liver, not elsewhere classified: Secondary | ICD-10-CM

## 2015-11-17 DIAGNOSIS — M722 Plantar fascial fibromatosis: Secondary | ICD-10-CM

## 2015-11-17 DIAGNOSIS — M25572 Pain in left ankle and joints of left foot: Secondary | ICD-10-CM

## 2015-11-17 DIAGNOSIS — R5383 Other fatigue: Secondary | ICD-10-CM | POA: Insufficient documentation

## 2015-11-17 DIAGNOSIS — E785 Hyperlipidemia, unspecified: Secondary | ICD-10-CM

## 2015-11-17 HISTORY — DX: Plantar fascial fibromatosis: M72.2

## 2015-11-17 HISTORY — DX: Other fatigue: R53.83

## 2015-11-17 LAB — CBC
HCT: 41.6 % (ref 35.0–45.0)
HEMOGLOBIN: 14.4 g/dL (ref 11.7–15.5)
MCH: 30.5 pg (ref 27.0–33.0)
MCHC: 34.6 g/dL (ref 32.0–36.0)
MCV: 88.1 fL (ref 80.0–100.0)
MPV: 9.8 fL (ref 7.5–12.5)
PLATELETS: 186 10*3/uL (ref 140–400)
RBC: 4.72 MIL/uL (ref 3.80–5.10)
RDW: 12.7 % (ref 11.0–15.0)
WBC: 6.4 10*3/uL (ref 3.8–10.8)

## 2015-11-17 LAB — TSH: TSH: 0.19 mIU/L — ABNORMAL LOW

## 2015-11-17 MED ORDER — METOPROLOL TARTRATE 50 MG PO TABS: 50.0000 mg | ORAL_TABLET | Freq: Two times a day (BID) | ORAL | 3 refills | Status: DC

## 2015-11-17 MED ORDER — VENLAFAXINE HCL ER 75 MG PO CP24: 75.0000 mg | ORAL_CAPSULE | Freq: Every day | ORAL | 3 refills | Status: DC

## 2015-11-17 MED ORDER — METFORMIN HCL 500 MG PO TABS: 500.0000 mg | ORAL_TABLET | Freq: Two times a day (BID) | ORAL | 3 refills | Status: DC

## 2015-11-17 MED ORDER — LEVOTHYROXINE SODIUM 100 MCG PO TABS: 100.0000 ug | ORAL_TABLET | Freq: Every day | ORAL | 3 refills | Status: DC

## 2015-11-17 MED ORDER — BENAZEPRIL HCL 10 MG PO TABS
10.0000 mg | ORAL_TABLET | Freq: Every day | ORAL | 3 refills | Status: DC
Start: 2015-11-17 — End: 2016-12-03

## 2015-11-17 MED ORDER — PRAVASTATIN SODIUM 40 MG PO TABS: 40.0000 mg | ORAL_TABLET | Freq: Every day | ORAL | 3 refills | Status: DC

## 2015-11-17 NOTE — Patient Instructions (Signed)
Thank you for coming in today. Get labs today.  Return in 3 months for recheck.  Return sooner if needed.  Do the foot exercises and ice massage for plantar fasciitis.    Plantar Fasciitis With Rehab The plantar fascia is a fibrous, ligament-like, soft-tissue structure that spans the bottom of the foot. Plantar fasciitis, also called heel spur syndrome, is a condition that causes pain in the foot due to inflammation of the tissue. SYMPTOMS   Pain and tenderness on the underneath side of the foot.  Pain that worsens with standing or walking. CAUSES  Plantar fasciitis is caused by irritation and injury to the plantar fascia on the underneath side of the foot. Common mechanisms of injury include:  Direct trauma to bottom of the foot.  Damage to a small nerve that runs under the foot where the main fascia attaches to the heel bone.  Stress placed on the plantar fascia due to bone spurs. RISK INCREASES WITH:   Activities that place stress on the plantar fascia (running, jumping, pivoting, or cutting).  Poor strength and flexibility.  Improperly fitted shoes.  Tight calf muscles.  Flat feet.  Failure to warm-up properly before activity.  Obesity. PREVENTION  Warm up and stretch properly before activity.  Allow for adequate recovery between workouts.  Maintain physical fitness:  Strength, flexibility, and endurance.  Cardiovascular fitness.  Maintain a health body weight.  Avoid stress on the plantar fascia.  Wear properly fitted shoes, including arch supports for individuals who have flat feet. PROGNOSIS  If treated properly, then the symptoms of plantar fasciitis usually resolve without surgery. However, occasionally surgery is necessary. RELATED COMPLICATIONS   Recurrent symptoms that may result in a chronic condition.  Problems of the lower back that are caused by compensating for the injury, such as limping.  Pain or weakness of the foot during push-off  following surgery.  Chronic inflammation, scarring, and partial or complete fascia tear, occurring more often from repeated injections. TREATMENT  Treatment initially involves the use of ice and medication to help reduce pain and inflammation. The use of strengthening and stretching exercises may help reduce pain with activity, especially stretches of the Achilles tendon. These exercises may be performed at home or with a therapist. Your caregiver may recommend that you use heel cups of arch supports to help reduce stress on the plantar fascia. Occasionally, corticosteroid injections are given to reduce inflammation. If symptoms persist for greater than 6 months despite non-surgical (conservative), then surgery may be recommended.  MEDICATION   If pain medication is necessary, then nonsteroidal anti-inflammatory medications, such as aspirin and ibuprofen, or other minor pain relievers, such as acetaminophen, are often recommended.  Do not take pain medication within 7 days before surgery.  Prescription pain relievers may be given if deemed necessary by your caregiver. Use only as directed and only as much as you need.  Corticosteroid injections may be given by your caregiver. These injections should be reserved for the most serious cases, because they may only be given a certain number of times. HEAT AND COLD  Cold treatment (icing) relieves pain and reduces inflammation. Cold treatment should be applied for 10 to 15 minutes every 2 to 3 hours for inflammation and pain and immediately after any activity that aggravates your symptoms. Use ice packs or massage the area with a piece of ice (ice massage).  Heat treatment may be used prior to performing the stretching and strengthening activities prescribed by your caregiver, physical therapist, or athletic  trainer. Use a heat pack or soak the injury in warm water. SEEK IMMEDIATE MEDICAL CARE IF:  Treatment seems to offer no benefit, or the condition  worsens.  Any medications produce adverse side effects. EXERCISES RANGE OF MOTION (ROM) AND STRETCHING EXERCISES - Plantar Fasciitis (Heel Spur Syndrome) These exercises may help you when beginning to rehabilitate your injury. Your symptoms may resolve with or without further involvement from your physician, physical therapist or athletic trainer. While completing these exercises, remember:   Restoring tissue flexibility helps normal motion to return to the joints. This allows healthier, less painful movement and activity.  An effective stretch should be held for at least 30 seconds.  A stretch should never be painful. You should only feel a gentle lengthening or release in the stretched tissue. RANGE OF MOTION - Toe Extension, Flexion  Sit with your right / left leg crossed over your opposite knee.  Grasp your toes and gently pull them back toward the top of your foot. You should feel a stretch on the bottom of your toes and/or foot.  Hold this stretch for __________ seconds.  Now, gently pull your toes toward the bottom of your foot. You should feel a stretch on the top of your toes and or foot.  Hold this stretch for __________ seconds. Repeat __________ times. Complete this stretch __________ times per day.  RANGE OF MOTION - Ankle Dorsiflexion, Active Assisted  Remove shoes and sit on a chair that is preferably not on a carpeted surface.  Place right / left foot under knee. Extend your opposite leg for support.  Keeping your heel down, slide your right / left foot back toward the chair until you feel a stretch at your ankle or calf. If you do not feel a stretch, slide your bottom forward to the edge of the chair, while still keeping your heel down.  Hold this stretch for __________ seconds. Repeat __________ times. Complete this stretch __________ times per day.  STRETCH - Gastroc, Standing  Place hands on wall.  Extend right / left leg, keeping the front knee somewhat  bent.  Slightly point your toes inward on your back foot.  Keeping your right / left heel on the floor and your knee straight, shift your weight toward the wall, not allowing your back to arch.  You should feel a gentle stretch in the right / left calf. Hold this position for __________ seconds. Repeat __________ times. Complete this stretch __________ times per day. STRETCH - Soleus, Standing  Place hands on wall.  Extend right / left leg, keeping the other knee somewhat bent.  Slightly point your toes inward on your back foot.  Keep your right / left heel on the floor, bend your back knee, and slightly shift your weight over the back leg so that you feel a gentle stretch deep in your back calf.  Hold this position for __________ seconds. Repeat __________ times. Complete this stretch __________ times per day. STRETCH - Gastrocsoleus, Standing  Note: This exercise can place a lot of stress on your foot and ankle. Please complete this exercise only if specifically instructed by your caregiver.   Place the ball of your right / left foot on a step, keeping your other foot firmly on the same step.  Hold on to the wall or a rail for balance.  Slowly lift your other foot, allowing your body weight to press your heel down over the edge of the step.  You should feel a stretch  in your right / left calf.  Hold this position for __________ seconds.  Repeat this exercise with a slight bend in your right / left knee. Repeat __________ times. Complete this stretch __________ times per day.  STRENGTHENING EXERCISES - Plantar Fasciitis (Heel Spur Syndrome)  These exercises may help you when beginning to rehabilitate your injury. They may resolve your symptoms with or without further involvement from your physician, physical therapist or athletic trainer. While completing these exercises, remember:   Muscles can gain both the endurance and the strength needed for everyday activities through  controlled exercises.  Complete these exercises as instructed by your physician, physical therapist or athletic trainer. Progress the resistance and repetitions only as guided. STRENGTH - Towel Curls  Sit in a chair positioned on a non-carpeted surface.  Place your foot on a towel, keeping your heel on the floor.  Pull the towel toward your heel by only curling your toes. Keep your heel on the floor.  If instructed by your physician, physical therapist or athletic trainer, add ____________________ at the end of the towel. Repeat __________ times. Complete this exercise __________ times per day. STRENGTH - Ankle Inversion  Secure one end of a rubber exercise band/tubing to a fixed object (table, pole). Loop the other end around your foot just before your toes.  Place your fists between your knees. This will focus your strengthening at your ankle.  Slowly, pull your big toe up and in, making sure the band/tubing is positioned to resist the entire motion.  Hold this position for __________ seconds.  Have your muscles resist the band/tubing as it slowly pulls your foot back to the starting position. Repeat __________ times. Complete this exercises __________ times per day.    This information is not intended to replace advice given to you by your health care provider. Make sure you discuss any questions you have with your health care provider.   Document Released: 01/31/2005 Document Revised: 06/17/2014 Document Reviewed: 05/15/2008 Elsevier Interactive Patient Education [employer].

## 2015-11-17 NOTE — Progress Notes (Signed)
[name] is a 55 y.o. female who presents to [facility]: [facility] today for follow-up diabetes hypertension hyperlipidemia discussed plantar fasciitis and fatigue.  Diabetes: Doing well with the below regimen. Patient is focused on diet and exercise and managed to lose weight. She denies polyuria or polydipsia  Hypertension: Also doing well with weight loss. Medications updated below. No chest pains or palpitations shortness of breath.  Hyperlipidemia: Doing well without muscle pain on pravastatin.  Left foot pain: Patient has new left plantar foot pain for one month. She has history of left Achilles tendinitis surgery remotely. She notes pain is located at the plantar calcaneus with ambulation. Pain is consistent with previous episodes of plantar fasciitis. She has used a over-the-counter insole which has helped a lot. Symptoms are moderate in interfering with walking.  Fatigue: Patient notes fatigue present for the last few months. No chest pains palpitations shortness of breath. She notes that she feels tired during the day. She denies any depression symptoms. She typically gets about 6 hours of sleep at night and has trouble falling asleep. He uses a computer or found right up until bedtime.  Past Medical History:  Diagnosis Date  . ALLERGIC RHINITIS 04/10/2008  . DEPRESSION 08/13/2008  . HYPERLIPIDEMIA 08/04/2006  . HYPERTENSION 08/04/2006  . HYPOTHYROIDISM 08/04/2006  . LOW BACK PAIN 08/04/2006  . PERIMENOPAUSAL SYNDROME 05/08/2008  . PVD (peripheral vascular disease) ([place])    occlusive, status post bifem bypass 2007   Past Surgical History:  Procedure Laterality Date  . CHOLECYSTECTOMY    . FEMORAL BYPASS     bifem.  Marland Kitchen HAGLAND'S DEFORMITY EXCISION    . TUBAL LIGATION     Social History  Substance Use Topics  . Smoking status: Former Smoker    Quit date: 02/15/2004    . Smokeless tobacco: Never Used  . Alcohol use No   family history includes Alcohol abuse in her brother and maternal uncle; Diabetes in her maternal grandmother; Heart disease in her father; Hyperlipidemia in her father; Hypertension in her father and mother; Stroke in her maternal uncle.  ROS as above:  Medications: Current Outpatient Prescriptions  Medication Sig Dispense Refill  . benazepril (LOTENSIN) 10 MG tablet Take 1 tablet (10 mg total) by mouth daily. 90 tablet 3  . diclofenac sodium (VOLTAREN) 1 % GEL Apply 2 g topically 4 (four) times daily. To affected joint. 100 g 11  . fexofenadine (ALLEGRA) 180 MG tablet Take 1 tablet (180 mg total) by mouth daily. 30 tablet 12  . levothyroxine (SYNTHROID, LEVOTHROID) 100 MCG tablet Take 1 tablet (100 mcg total) by mouth daily before breakfast. 90 tablet 3  . metFORMIN (GLUCOPHAGE) 500 MG tablet Take 1 tablet (500 mg total) by mouth 2 (two) times daily with a meal. 180 tablet 3  . metoprolol (LOPRESSOR) 50 MG tablet Take 1 tablet (50 mg total) by mouth 2 (two) times daily. 180 tablet 3  . pravastatin (PRAVACHOL) 40 MG tablet Take 1 tablet (40 mg total) by mouth daily. 90 tablet 3  . venlafaxine XR (EFFEXOR-XR) 75 MG 24 hr capsule Take 1 capsule (75 mg total) by mouth daily with breakfast. Patient needs to schedule a follow up appointment before more refills. 90 capsule 3   No current facility-administered medications for this visit.    Allergies  Allergen Reactions  . Influenza Virus Vacc Split Pf   . Lipitor [Atorvastatin] Other (See Comments)    Dizziness.  Exam:  BP 137/88   Pulse 87   Wt 198 lb (89.8 kg)   BMI 32.95 kg/m  Gen: Well NAD HEENT: EOMI,  MMM Lungs: Normal work of breathing. CTABL Heart: RRR no MRG Abd: NABS, Soft. Nondistended, Nontender Exts: Brisk capillary refill, warm and well perfused.  Left foot and ankle: Medial Achilles scar present. Posterior calcaneus nontender. Plantar calcaneus mildly tender.  Normal foot motion. Psych: Alert and oriented normal speech some process and affect.  No results found for this or any previous visit (from the past 24 hour(s)). No results found.    Assessment and Plan: 55 y.o. female with  Diabetes: Doing well. Check A1c. Continue current regimen  Hypertension: Doing well with current regimen. Check fasting labs  Lipids: As above continue regimen. Check labs  Thyroid: Check TSH free T4 free T3 continue levothyroxine  Left plantar fasciitis: Plan for eccentric exercises and ice. Return if not better will consider injection at that time  Fatigue: Likely due to poor quality sleep. Discussed sleep hygiene. Recheck if not better. Check TSH.  Vitamin D deficiency: Check vitamin D level  Colon cancer screening: Cologuard ordered  Orders Placed This Encounter  Procedures  . CBC  . COMPLETE METABOLIC PANEL WITH GFR  . Hemoglobin A1c  . Lipid panel  . TSH  . VITAMIN D 25 Hydroxy (Vit-D Deficiency, Fractures)    Discussed warning signs or symptoms. Please see discharge instructions. Patient expresses understanding.

## 2015-11-18 LAB — COMPLETE METABOLIC PANEL WITH GFR
ALBUMIN: 4.2 g/dL (ref 3.6–5.1)
ALK PHOS: 120 U/L (ref 33–130)
ALT: 27 U/L (ref 6–29)
AST: 21 U/L (ref 10–35)
BUN: 13 mg/dL (ref 7–25)
CHLORIDE: 106 mmol/L (ref 98–110)
CO2: 21 mmol/L (ref 20–31)
Calcium: 9.4 mg/dL (ref 8.6–10.4)
Creat: 0.84 mg/dL (ref 0.50–1.05)
GFR, EST NON AFRICAN AMERICAN: 78 mL/min (ref >=60)
GFR, Est African American: >89 mL/min (ref >=60)
GLUCOSE: 118 mg/dL — AB (ref 65–99)
POTASSIUM: 4.2 mmol/L (ref 3.5–5.3)
SODIUM: 139 mmol/L (ref 135–146)
Total Bilirubin: 0.4 mg/dL (ref 0.2–1.2)
Total Protein: 7.1 g/dL (ref 6.1–8.1)

## 2015-11-18 LAB — HEMOGLOBIN A1C
HEMOGLOBIN A1C: 6 % — AB (ref <5.7)
Mean Plasma Glucose: 126 mg/dL

## 2015-11-18 LAB — LIPID PANEL
CHOLESTEROL: 201 mg/dL — AB (ref 125–200)
HDL: 40 mg/dL — AB (ref >=46)
LDL Cholesterol: 138 mg/dL — ABNORMAL HIGH (ref <130)
TRIGLYCERIDES: 113 mg/dL (ref <150)
Total CHOL/HDL Ratio: 5 Ratio (ref <=5.0)
VLDL: 23 mg/dL (ref <30)

## 2015-11-18 LAB — VITAMIN D 25 HYDROXY (VIT D DEFICIENCY, FRACTURES): Vit D, 25-Hydroxy: 24 ng/mL — ABNORMAL LOW (ref 30–100)

## 2015-11-19 MED ORDER — PRAVASTATIN SODIUM 80 MG PO TABS: 80.0000 mg | ORAL_TABLET | Freq: Every day | ORAL | 3 refills | Status: DC

## 2015-11-19 NOTE — Addendum Note (Signed)
Addended by: Gregor Hams on: 11/19/2015 07:11 AM   Modules accepted: Orders

## 2015-11-20 ENCOUNTER — Encounter: Payer: Self-pay | Admitting: Family Medicine

## 2015-11-26 ENCOUNTER — Encounter: Payer: Self-pay | Admitting: Osteopathic Medicine

## 2015-11-26 ENCOUNTER — Ambulatory Visit (INDEPENDENT_AMBULATORY_CARE_PROVIDER_SITE_OTHER): Payer: PRIVATE HEALTH INSURANCE | Admitting: Osteopathic Medicine

## 2015-11-26 VITALS — BP 117/78 | HR 59 | Temp 98.1°F | Wt 200.0 lb

## 2015-11-26 DIAGNOSIS — J029 Acute pharyngitis, unspecified: Secondary | ICD-10-CM | POA: Diagnosis not present

## 2015-11-26 DIAGNOSIS — B9789 Other viral agents as the cause of diseases classified elsewhere: Secondary | ICD-10-CM

## 2015-11-26 DIAGNOSIS — J069 Acute upper respiratory infection, unspecified: Secondary | ICD-10-CM

## 2015-11-26 LAB — POCT RAPID STREP A (OFFICE): RAPID STREP A SCREEN: NEGATIVE

## 2015-11-26 MED ORDER — BENZONATATE 200 MG PO CAPS: 200.0000 mg | ORAL_CAPSULE | Freq: Three times a day (TID) | ORAL | 0 refills | Status: DC | PRN

## 2015-11-26 MED ORDER — IPRATROPIUM BROMIDE 0.03 % NA SOLN: 2.0000 | Freq: Three times a day (TID) | NASAL | 0 refills | Status: DC | PRN

## 2015-11-26 NOTE — Progress Notes (Signed)
HPI: [name] is a 55 y.o. female  who presents to [facility] today, 11/26/15,  for chief complaint of:  Chief Complaint  Patient presents with  . Sore Throat    Sore/scratchy throat, some sinus congestion on left side, some ear pain and pressure on the left side. Minimal cough. His tried over-the-counter medications with Mucinex, takes Allegra daily for allergies. Overall feeling sick for the past 2-3 days or so. Associated symptoms include fatigue/malaise, subjective fever/chills.  Past medical, surgical, social and family history reviewed: Past Medical History:  Diagnosis Date  . ALLERGIC RHINITIS 04/10/2008  . DEPRESSION 08/13/2008  . HYPERLIPIDEMIA 08/04/2006  . HYPERTENSION 08/04/2006  . HYPOTHYROIDISM 08/04/2006  . LOW BACK PAIN 08/04/2006  . PERIMENOPAUSAL SYNDROME 05/08/2008  . PVD (peripheral vascular disease) ([place])    occlusive, status post bifem bypass 2007   Past Surgical History:  Procedure Laterality Date  . CHOLECYSTECTOMY    . FEMORAL BYPASS     bifem.  Marland Kitchen HAGLAND'S DEFORMITY EXCISION    . TUBAL LIGATION     Social History  Substance Use Topics  . Smoking status: Former Smoker    Quit date: 02/15/2004  . Smokeless tobacco: Never Used  . Alcohol use No   Family History  Problem Relation Age of Onset  . Hypertension Mother   . Heart disease Father   . Hyperlipidemia Father   . Hypertension Father   . Diabetes Maternal Grandmother   . Alcohol abuse Brother   . Alcohol abuse Maternal Uncle   . Stroke Maternal Uncle      Current medication list and allergy/intolerance information reviewed:   Current Outpatient Prescriptions  Medication Sig Dispense Refill  . benazepril (LOTENSIN) 10 MG tablet Take 1 tablet (10 mg total) by mouth daily. 90 tablet 3  . diclofenac sodium (VOLTAREN) 1 % GEL Apply 2 g topically 4 (four) times daily. To affected joint. 100 g 11  . fexofenadine (ALLEGRA) 180 MG tablet Take 1 tablet (180 mg  total) by mouth daily. 30 tablet 12  . levothyroxine (SYNTHROID, LEVOTHROID) 100 MCG tablet Take 1 tablet (100 mcg total) by mouth daily before breakfast. 90 tablet 3  . metFORMIN (GLUCOPHAGE) 500 MG tablet Take 1 tablet (500 mg total) by mouth 2 (two) times daily with a meal. 180 tablet 3  . metoprolol (LOPRESSOR) 50 MG tablet Take 1 tablet (50 mg total) by mouth 2 (two) times daily. 180 tablet 3  . pravastatin (PRAVACHOL) 80 MG tablet Take 1 tablet (80 mg total) by mouth daily. 90 tablet 3  . venlafaxine XR (EFFEXOR-XR) 75 MG 24 hr capsule Take 1 capsule (75 mg total) by mouth daily with breakfast. Patient needs to schedule a follow up appointment before more refills. 90 capsule 3   No current facility-administered medications for this visit.    Allergies  Allergen Reactions  . Influenza Virus Vacc Split Pf   . Lipitor [Atorvastatin] Other (See Comments)    Dizziness.       Review of Systems:  Constitutional:  Subjective fever, +chills, +recent illness, No unintentional weight changes. +significant fatigue.   HEENT: No  headache, no vision change, no hearing change, +sore throat, +sinus pressure  Cardiac: No  chest pain, No  pressure  Respiratory:  No  shortness of breath. +Cough  Gastrointestinal: No  abdominal pain, No  nausea, No  vomiting,  No  blood in stool, No  diarrhea, No  constipation   Exam:  BP 117/78  Pulse (!) 59   Temp 98.1 F (36.7 C) (Oral)   Wt 200 lb (90.7 kg)   BMI 33.28 kg/m   Constitutional: VS see above. General Appearance: alert, well-developed, well-nourished, NAD  Eyes: Normal lids and conjunctive, non-icteric sclera  Ears, Nose, Mouth, Throat: MMM, Normal external inspection ears/nares/mouth/lips/gums. TM normal bilaterally. Pharynx/tonsils no erythema, no exudate. Nasal mucosa normal.   Neck: No masses, trachea midline. No thyroid enlargement. No tenderness/mass appreciated. No lymphadenopathy  Respiratory: Normal respiratory effort. no  wheeze, no rhonchi, no rales  Cardiovascular: S1/S2 normal, no murmur, no rub/gallop auscultated. RRR.   reflexes intact. Normal balance/coordination. No tremor.   Skin: warm, dry, intact.    Results for orders placed or performed in visit on 11/26/15 (from the past 72 hour(s))  POCT rapid strep A     Status: None   Collection Time: 11/26/15  1:26 PM  Result Value Ref Range   Rapid Strep A Screen Negative Negative      ASSESSMENT/PLAN:   Sore throat - Plan: POCT rapid strep A  Viral upper respiratory illness - Plan: ipratropium (ATROVENT) 0.03 % nasal spray, benzonatate (TESSALON) 200 MG capsule    Patient Instructions  Most likely your symptoms are due to viral upper respiratory illness causing postnasal drip, sore throat, cough, sinus congestion. The common cold or viral pharyngitis is not something that we can cure, but we can help control symptoms while your body fights the infection.   You've been given prescription for nasal spray to help with sinus drainage, plus a prescription for cough medicine. Other over-the-counter remedies which are typically helpful include taking all of the following together: Ibuprofen, Tylenol, decongestants such as Sudafed, antihistamine such as Benadryl or Claritin. Use caution, many generics are sold in cold/flu formulations as combination of multiple generics, always ask a pharmacist if you are concerned about any medication interactions or duplications.   Look for lozenges which contain menthol plus benzocaine, these will help numb the throat and prevent cough. Be sure you're staying well hydrated with plenty of water or warm tea with honey. Tea with marshmallow root, licorice root, or elm bark helps "coat the throat" to relieve sore throat symptoms.   Most people feel better from a viral upper respiratory infection in 7-10 days, though cough symptoms can linger for several weeks. Please let us know if you're not getting better or if you get  worse.    Visit summary with medication list and pertinent instructions was printed for patient to review. All questions at time of visit were answered - patient instructed to contact office with any additional concerns. ER/RTC precautions were reviewed with the patient. Follow-up plan: Return if symptoms worsen or fail to improve.

## 2015-11-26 NOTE — Patient Instructions (Signed)
Most likely your symptoms are due to viral upper respiratory illness causing postnasal drip, sore throat, cough, sinus congestion. The common cold or viral pharyngitis is not something that we can cure, but we can help control symptoms while your body fights the infection.   You've been given prescription for nasal spray to help with sinus drainage, plus a prescription for cough medicine. Other over-the-counter remedies which are typically helpful include taking all of the following together: Ibuprofen, Tylenol, decongestants such as Sudafed, antihistamine such as Benadryl or Claritin. Use caution, many generics are sold in cold/flu formulations as combination of multiple generics, always ask a pharmacist if you are concerned about any medication interactions or duplications.   Look for lozenges which contain menthol plus benzocaine, these will help numb the throat and prevent cough. Be sure you're staying well hydrated with plenty of water or warm tea with honey. Tea with marshmallow root, licorice root, or elm bark helps "coat the throat" to relieve sore throat symptoms.   Most people feel better from a viral upper respiratory infection in 7-10 days, though cough symptoms can linger for several weeks. Please let us know if you're not getting better or if you get worse.

## 2015-12-14 ENCOUNTER — Encounter: Payer: Self-pay | Admitting: Family Medicine

## 2015-12-14 ENCOUNTER — Ambulatory Visit (INDEPENDENT_AMBULATORY_CARE_PROVIDER_SITE_OTHER): Payer: PRIVATE HEALTH INSURANCE | Admitting: Family Medicine

## 2015-12-14 VITALS — BP 125/75 | HR 75 | Wt 202.0 lb

## 2015-12-14 DIAGNOSIS — R002 Palpitations: Secondary | ICD-10-CM | POA: Diagnosis not present

## 2015-12-14 DIAGNOSIS — E890 Postprocedural hypothyroidism: Secondary | ICD-10-CM

## 2015-12-14 NOTE — Patient Instructions (Signed)
Thank you for coming in today. Get labs today.  You should hear about the holter monitor.  If your thyroid is still off I will change the medicine.    Palpitations A palpitation is the feeling that your heartbeat is irregular or is faster than normal. It may feel like your heart is fluttering or skipping a beat. Palpitations are usually not a serious problem. However, in some cases, you may need further medical evaluation. CAUSES  Palpitations can be caused by:  Smoking.  Caffeine or other stimulants, such as diet pills or energy drinks.  Alcohol.  Stress and anxiety.  Strenuous physical activity.  Fatigue.  Certain medicines.  Heart disease, especially if you have a history of irregular heart rhythms (arrhythmias), such as atrial fibrillation, atrial flutter, or supraventricular tachycardia.  An improperly working pacemaker or defibrillator. DIAGNOSIS  To find the cause of your palpitations, your health care provider will take your medical history and perform a physical exam. Your health care provider may also have you take a test called an ambulatory electrocardiogram (ECG). An ECG records your heartbeat patterns over a 24-hour period. You may also have other tests, such as:  Transthoracic echocardiogram (TTE). During echocardiography, sound waves are used to evaluate how blood flows through your heart.  Transesophageal echocardiogram (TEE).  Cardiac monitoring. This allows your health care provider to monitor your heart rate and rhythm in real time.  Holter monitor. This is a portable device that records your heartbeat and can help diagnose heart arrhythmias. It allows your health care provider to track your heart activity for several days, if needed.  Stress tests by exercise or by giving medicine that makes the heart beat faster. TREATMENT  Treatment of palpitations depends on the cause of your symptoms and can vary greatly. Most cases of palpitations do not require any  treatment other than time, relaxation, and monitoring your symptoms. Other causes, such as atrial fibrillation, atrial flutter, or supraventricular tachycardia, usually require further treatment. HOME CARE INSTRUCTIONS   Avoid:  Caffeinated coffee, tea, soft drinks, diet pills, and energy drinks.  Chocolate.  Alcohol.  Stop smoking if you smoke.  Reduce your stress and anxiety. Things that can help you relax include:  A method of controlling things in your body, such as your heartbeats, with your mind (biofeedback).  Yoga.  Meditation.  Physical activity such as swimming, jogging, or walking.  Get plenty of rest and sleep. SEEK MEDICAL CARE IF:   You continue to have a fast or irregular heartbeat beyond 24 hours.  Your palpitations occur more often. SEEK IMMEDIATE MEDICAL CARE IF:  You have chest pain or shortness of breath.  You have a severe headache.  You feel dizzy or you faint. MAKE SURE YOU:  Understand these instructions.  Will watch your condition.  Will get help right away if you are not doing well or get worse.   This information is not intended to replace advice given to you by your health care provider. Make sure you discuss any questions you have with your health care provider.   Document Released: 01/29/2000 Document Revised: 02/05/2013 Document Reviewed: 04/01/2011 Elsevier Interactive Patient Education [employer].

## 2015-12-14 NOTE — Progress Notes (Signed)
[name] is a 55 y.o. female who presents to [place] [place] today for palpitations off and on for a few weeks. No fever, chills, NVD. She denies any chest pain or SOB. She has not tried any medications yet. She continues her blood pressure medications including her metoprolol. She does also take levothyroxine. Of note she recently had a TSH lab that was a bit low.    Past Medical History:  Diagnosis Date  . ALLERGIC RHINITIS 04/10/2008  . DEPRESSION 08/13/2008  . HYPERLIPIDEMIA 08/04/2006  . HYPERTENSION 08/04/2006  . HYPOTHYROIDISM 08/04/2006  . LOW BACK PAIN 08/04/2006  . PERIMENOPAUSAL SYNDROME 05/08/2008  . PVD (peripheral vascular disease) ([place])    occlusive, status post bifem bypass 2007   Past Surgical History:  Procedure Laterality Date  . CHOLECYSTECTOMY    . FEMORAL BYPASS     bifem.  Marland Kitchen HAGLAND'S DEFORMITY EXCISION    . TUBAL LIGATION     Social History  Substance Use Topics  . Smoking status: Former Smoker    Quit date: 02/15/2004  . Smokeless tobacco: Never Used  . Alcohol use No   family history includes Alcohol abuse in her brother and maternal uncle; Diabetes in her maternal grandmother; Heart disease in her father; Hyperlipidemia in her father; Hypertension in her father and mother; Stroke in her maternal uncle.  ROS as above:  Medications: Current Outpatient Prescriptions  Medication Sig Dispense Refill  . benazepril (LOTENSIN) 10 MG tablet Take 1 tablet (10 mg total) by mouth daily. 90 tablet 3  . benzonatate (TESSALON) 200 MG capsule Take 1 capsule (200 mg total) by mouth 3 (three) times daily as needed for cough. 30 capsule 0  . diclofenac sodium (VOLTAREN) 1 % GEL Apply 2 g topically 4 (four) times daily. To affected joint. 100 g 11  . fexofenadine (ALLEGRA) 180 MG tablet Take 1 tablet (180 mg total) by mouth daily. 30 tablet 12  .  ipratropium (ATROVENT) 0.03 % nasal spray Place 2 sprays into both nostrils 3 (three) times daily as needed for rhinitis. 30 mL 0  . levothyroxine (SYNTHROID, LEVOTHROID) 100 MCG tablet Take 1 tablet (100 mcg total) by mouth daily before breakfast. 90 tablet 3  . metFORMIN (GLUCOPHAGE) 500 MG tablet Take 1 tablet (500 mg total) by mouth 2 (two) times daily with a meal. 180 tablet 3  . metoprolol (LOPRESSOR) 50 MG tablet Take 1 tablet (50 mg total) by mouth 2 (two) times daily. 180 tablet 3  . pravastatin (PRAVACHOL) 80 MG tablet Take 1 tablet (80 mg total) by mouth daily. 90 tablet 3  . venlafaxine XR (EFFEXOR-XR) 75 MG 24 hr capsule Take 1 capsule (75 mg total) by mouth daily with breakfast. Patient needs to schedule a follow up appointment before more refills. 90 capsule 3   No current facility-administered medications for this visit.    Allergies  Allergen Reactions  . Influenza Virus Vacc Split Pf   . Lipitor [Atorvastatin] Other (See Comments)    Dizziness.     Health Maintenance Health Maintenance  Topic Date Due  . OPHTHALMOLOGY EXAM  04/08/1970  . COLONOSCOPY  04/08/2010  . PAP SMEAR  01/17/2016  . FOOT EXAM  02/11/2016  . HEMOGLOBIN A1C  05/17/2016  . MAMMOGRAM  02/03/2017  . TETANUS/TDAP  08/14/2018  . PNEUMOCOCCAL POLYSACCHARIDE VACCINE (2) 02/11/2020  . Hepatitis C Screening  Completed  . HIV Screening  Completed  Exam:  BP 125/75   Pulse 75   Wt 202 lb (91.6 kg)   BMI 33.61 kg/m  Gen: Well NAD HEENT: EOMI,  MMM Lungs: Normal work of breathing. CTABL Heart: RRR no MRG Abd: NABS, Soft. Nondistended, Nontender Exts: Brisk capillary refill, warm and well perfused. No edema.   12 lead EKG: NSR at a rate of 70 BPM. No ST elevation or Q waves. No irregularities noted.    No results found for this or any previous visit (from the past 72 hour(s)). No results found.    Assessment and Plan: 55 y.o. female with  Palpitations with unclear etiology. Suppressed  TSH could be a factor. Will recheck thyroid labs and if still suppressed we will reduce levothyroxine dose.  Also will obtain CBC and CMP. Will also check a Holter monitor. Recheck in 1 month or sooner if needed.     Orders Placed This Encounter  Procedures  . T4, free  . T3, free  . TSH  . CBC  . COMPLETE METABOLIC PANEL WITH GFR  . Holter monitor - 24 hour    Standing Status:   Future    Standing Expiration Date:   12/13/2025    Order Specific Question:   Where should this test be performed?    Answer:   [facility]  . EKG 12-Lead    Discussed warning signs or symptoms. Please see discharge instructions. Patient expresses understanding.

## 2015-12-15 LAB — COMPLETE METABOLIC PANEL WITH GFR
ALT: 31 U/L — AB (ref 6–29)
AST: 27 U/L (ref 10–35)
Albumin: 4.3 g/dL (ref 3.6–5.1)
Alkaline Phosphatase: 111 U/L (ref 33–130)
BILIRUBIN TOTAL: 0.3 mg/dL (ref 0.2–1.2)
BUN: 17 mg/dL (ref 7–25)
CO2: 25 mmol/L (ref 20–31)
CREATININE: 0.85 mg/dL (ref 0.50–1.05)
Calcium: 9.6 mg/dL (ref 8.6–10.4)
Chloride: 105 mmol/L (ref 98–110)
GFR, EST AFRICAN AMERICAN: 89 mL/min (ref >=60)
GFR, Est Non African American: 77 mL/min (ref >=60)
Glucose, Bld: 98 mg/dL (ref 65–99)
Potassium: 4.2 mmol/L (ref 3.5–5.3)
Sodium: 139 mmol/L (ref 135–146)
TOTAL PROTEIN: 7.3 g/dL (ref 6.1–8.1)

## 2015-12-15 LAB — CBC
HEMATOCRIT: 44.6 % (ref 35.0–45.0)
Hemoglobin: 15.1 g/dL (ref 11.7–15.5)
MCH: 30.1 pg (ref 27.0–33.0)
MCHC: 33.9 g/dL (ref 32.0–36.0)
MCV: 88.8 fL (ref 80.0–100.0)
MPV: 9.9 fL (ref 7.5–12.5)
PLATELETS: 218 10*3/uL (ref 140–400)
RBC: 5.02 MIL/uL (ref 3.80–5.10)
RDW: 13.3 % (ref 11.0–15.0)
WBC: 8.5 10*3/uL (ref 3.8–10.8)

## 2015-12-15 LAB — T3, FREE: T3, Free: 2.5 pg/mL (ref 2.3–4.2)

## 2015-12-15 LAB — T4, FREE: FREE T4: 1.2 ng/dL (ref 0.8–1.8)

## 2015-12-15 LAB — TSH: TSH: 0.26 mIU/L — ABNORMAL LOW

## 2015-12-15 MED ORDER — LEVOTHYROXINE SODIUM 75 MCG PO TABS: 75.0000 ug | ORAL_TABLET | Freq: Every day | ORAL | 0 refills | Status: DC

## 2015-12-15 NOTE — Addendum Note (Signed)
Addended by: Gregor Hams on: 12/15/2015 07:13 AM   Modules accepted: Orders

## 2015-12-24 ENCOUNTER — Ambulatory Visit (INDEPENDENT_AMBULATORY_CARE_PROVIDER_SITE_OTHER): Payer: PRIVATE HEALTH INSURANCE

## 2015-12-24 DIAGNOSIS — R002 Palpitations: Secondary | ICD-10-CM | POA: Diagnosis not present

## 2016-01-25 ENCOUNTER — Other Ambulatory Visit (INDEPENDENT_AMBULATORY_CARE_PROVIDER_SITE_OTHER): Payer: Self-pay | Admitting: Specialist

## 2016-01-25 MED ORDER — METHYLPREDNISOLONE 4 MG PO TBPK: ORAL_TABLET | ORAL | 0 refills | Status: DC

## 2016-01-25 NOTE — Progress Notes (Signed)
Can you please schedule patient to see Dr. Louanne Skye on 02/25/2016 at 2pm  Thanks.

## 2016-01-26 NOTE — Progress Notes (Signed)
lmom appointment 02/25/16 @ 2pm per [employer]

## 2016-02-16 ENCOUNTER — Ambulatory Visit: Payer: PRIVATE HEALTH INSURANCE | Admitting: Family Medicine

## 2016-02-25 ENCOUNTER — Ambulatory Visit (INDEPENDENT_AMBULATORY_CARE_PROVIDER_SITE_OTHER): Payer: 59 | Admitting: Specialist

## 2016-02-25 ENCOUNTER — Encounter (INDEPENDENT_AMBULATORY_CARE_PROVIDER_SITE_OTHER): Payer: Self-pay | Admitting: Specialist

## 2016-02-25 DIAGNOSIS — G5601 Carpal tunnel syndrome, right upper limb: Secondary | ICD-10-CM | POA: Diagnosis not present

## 2016-02-25 NOTE — Progress Notes (Signed)
Office Visit Note   Patient: [name]           Date of Birth: 1960-08-05           MRN: [id number] Visit Date: 02/25/2016              Requested by: Gregor Hams, MD [place] [address] [place], [place] [id number] PCP: Lynne Leader, MD   Assessment & Plan: Visit Diagnoses:  1. Carpal tunnel syndrome, right upper limb     Plan: Use wrist splint at night. Dr. Romona Curls secretary will call and schedule you for EMG/NCV of the right wrist.    Follow-Up Instructions: Return in about 3 weeks (around 03/17/2016).   Orders:  Orders Placed This Encounter  Procedures  . Ambulatory referral to Physical Medicine Rehab   No orders of the defined types were placed in this encounter.     Procedures: No procedures performed   Clinical Data: No additional findings.   Subjective: Chief Complaint  Patient presents with  . Right Wrist - Follow-up    [name] is here to follow up on her right wrist.  She states that she is sore, hand in going numb, she can't open jars, and she is dropping things. She is also complaining of pain going up to the elbow.  She has been wearing brace but doesn't want to depend on it.     Review of Systems  HENT: Positive for postnasal drip, rhinorrhea, sinus pain and sinus pressure.      Objective: Vital Signs: There were no vitals taken for this visit.  Physical Exam  Constitutional: She is oriented to person, place, and time. She appears well-developed and well-nourished.  HENT:  Head: Normocephalic and atraumatic.  Eyes: EOM are normal. Pupils are equal, round, and reactive to light.  Neck: Normal range of motion. Neck supple.  Pulmonary/Chest: Effort normal and breath sounds normal.  Abdominal: Soft. Bowel sounds are normal.  Musculoskeletal: Normal range of motion.  Neurological: She is alert and oriented to person, place, and time.  Skin: Skin is warm and dry.  Psychiatric: She has a normal mood and affect. Her behavior is normal.  Judgment and thought content normal.    Back Exam   Tenderness  The patient is experiencing tenderness in the cervical.  Range of Motion  Extension: normal  Flexion: normal  Lateral Bend Right: normal  Lateral Bend Left: normal  Rotation Right: normal  Rotation Left: normal   Muscle Strength  Right Quadriceps:  5/5  Left Quadriceps:  5/5  Right Hamstrings:  5/5  Left Hamstrings:  5/5   Tests  Straight leg raise right: negative Straight leg raise left: negative  Reflexes  Biceps: normal Babinski's sign: normal   Other  Erythema: no back redness Scars: absent   Right Hand Exam   Tenderness  The patient is experiencing tenderness in the palmer area.  Range of Motion   Wrist  Extension: normal  Flexion: normal  Pronation: normal  Supination: normal   Muscle Strength  Wrist Extension: 5/5  Wrist Flexion: 5/5  Grip: 5/5   Tests  Phalen's Sign: positive Tinel's Sign (Medial Nerve): positive  Other  Erythema: absent Sensation: decreased Pulse: present  Comments:  Right wrist Tinel's positive over distal carpal tunnel.      Specialty Comments:  No specialty comments available.  Imaging: No results found.   PMFS History: Patient Active Problem List   Diagnosis Date Noted  . Plantar fasciitis,  left 11/17/2015  . Fatigue 11/17/2015  . Sinusitis 03/10/2015  . 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 Vitamin D deficiency 01/14/2015  . Left ankle pain 01/13/2015  . Obesity 01/13/2015  . Glucose intolerance (impaired glucose tolerance) 03/29/2013  . Fatty liver disease, nonalcoholic 11/88/6773  . Peripheral vascular disease ([place]) 07/15/2011  . DEPRESSION 08/13/2008  . PERIMENOPAUSAL SYNDROME 05/08/2008  . ALLERGIC RHINITIS 04/10/2008  . Hypothyroidism 08/04/2006  . Dyslipidemia 08/04/2006  . Essential hypertension 08/04/2006   Past Medical History:  Diagnosis Date  . ALLERGIC RHINITIS 04/10/2008  . DEPRESSION  08/13/2008  . HYPERLIPIDEMIA 08/04/2006  . HYPERTENSION 08/04/2006  . HYPOTHYROIDISM 08/04/2006  . LOW BACK PAIN 08/04/2006  . PERIMENOPAUSAL SYNDROME 05/08/2008  . PVD (peripheral vascular disease) ([place])    occlusive, status post bifem bypass 2007    Family History  Problem Relation Age of Onset  . Hypertension Mother   . Heart disease Father   . Hyperlipidemia Father   . Hypertension Father   . Diabetes Maternal Grandmother   . Alcohol abuse Brother   . Alcohol abuse Maternal Uncle   . Stroke Maternal Uncle     Past Surgical History:  Procedure Laterality Date  . CHOLECYSTECTOMY    . FEMORAL BYPASS     bifem.  Marland Kitchen HAGLAND'S DEFORMITY EXCISION    . TUBAL LIGATION     Social History   Occupational History  . Not on file.   Social History Main Topics  . Smoking status: Former Smoker    Quit date: 02/15/2004  . Smokeless tobacco: Never Used  . Alcohol use No  . Drug use: No  . Sexual activity: Yes    Birth control/ protection: Post-menopausal

## 2016-02-25 NOTE — Patient Instructions (Signed)
Use wrist splint at night. Dr. Romona Curls secretary will call and schedule you for EMG/NCV of the right wrist.

## 2016-03-07 ENCOUNTER — Ambulatory Visit (INDEPENDENT_AMBULATORY_CARE_PROVIDER_SITE_OTHER): Payer: Managed Care, Other (non HMO) | Admitting: Specialist

## 2016-03-11 ENCOUNTER — Telehealth (INDEPENDENT_AMBULATORY_CARE_PROVIDER_SITE_OTHER): Payer: Self-pay | Admitting: Specialist

## 2016-03-11 ENCOUNTER — Encounter (INDEPENDENT_AMBULATORY_CARE_PROVIDER_SITE_OTHER): Payer: Self-pay | Admitting: Physical Medicine and Rehabilitation

## 2016-03-11 ENCOUNTER — Ambulatory Visit (INDEPENDENT_AMBULATORY_CARE_PROVIDER_SITE_OTHER): Payer: 59 | Admitting: Physical Medicine and Rehabilitation

## 2016-03-11 DIAGNOSIS — R202 Paresthesia of skin: Secondary | ICD-10-CM

## 2016-03-11 NOTE — Progress Notes (Signed)
[name] - 56 y.o. female MRN [id number]  Date of birth: 1960/09/18  Office Visit Note: Visit Date: 03/11/2016 PCP: Lynne Leader, MD Referred by: Gregor Hams, MD  Subjective: Chief Complaint  Patient presents with  . Right Arm - Pain, Numbness   HPI: HPI  Right arm pain and numbness- right hand dominant. Pain starts in right wrist, hand is numb in mornings ,pain radiates up arm. Drops things. 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 ROS Otherwise per HPI.  Assessment & Plan: Visit Diagnoses:  1. Paresthesia of skin     Plan: Findings:  Impression: The above electrodiagnostic study is ABNORMAL and reveals evidence of a moderate right median nerve entrapment at the wrist (carpal tunnel syndrome) affecting sensory and motor components. Careful clinical correlation with her symptoms is paramount.  There is no significant electrodiagnostic evidence of any other focal nerve entrapment, brachial plexopathy or cervical radiculopathy.   Recommendations: 1.  Follow-up with referring physician. 2.  Continue current management of symptoms. 3.  Continue use of resting splint at night-time and as needed during the day.      Meds & Orders: No orders of the defined types were placed in this encounter.   Orders Placed This Encounter  Procedures  . NCV with EMG (electromyography)    Follow-up: No Follow-up on file.   Procedures: No procedures performed  EMG & NCV Findings: Evaluation of the right median motor nerve showed prolonged distal onset latency (4.3 ms) and decreased conduction velocity (Elbow-Wrist, 49 m/s).  The right median (across palm) sensory nerve showed prolonged distal peak latency (Wrist, 3.8 ms).  All remaining nerves (as indicated in the following tables) were within normal limits.    All examined muscles (as indicated in the following table) showed no evidence of electrical instability.    Impression: The above electrodiagnostic study is  ABNORMAL and reveals evidence of a moderate right median nerve entrapment at the wrist (carpal tunnel syndrome) affecting sensory and motor components. Careful clinical correlation with her symptoms is paramount.  There is no significant electrodiagnostic evidence of any other focal nerve entrapment, brachial plexopathy or cervical radiculopathy.   Recommendations: 1.  Follow-up with referring physician. 2.  Continue current management of symptoms. 3.  Continue use of resting splint at night-time and as needed during the day.   Nerve Conduction Studies Anti Sensory Summary Table   Stim Site NR Peak (ms) Norm Peak (ms) P-T Amp (V) Norm P-T Amp Site1 Site2 Delta-P (ms) Dist (cm) Vel (m/s) Norm Vel (m/s)  Right Median Acr Palm Anti Sensory (2nd Digit)  32.2C  Wrist    *3.8 <3.6 26.7 >10 Wrist Palm 2.1 0.0    Palm    1.7 <2.0 19.9         Right Radial Anti Sensory (Base 1st Digit)  33.2C  Wrist    2.8 <3.1 10.2  Wrist Base 1st Digit 2.8 0.0    Right Ulnar Anti Sensory (5th Digit)  32.6C  Wrist    3.0 <3.7 28.6 >15.0 Wrist 5th Digit 3.0 14.0 47 >38   Motor Summary Table   Stim Site NR Onset (ms) Norm Onset (ms) O-P Amp (mV) Norm O-P Amp Site1 Site2 Delta-0 (ms) Dist (cm) Vel (m/s) Norm Vel (m/s)  Right Median Motor (Abd Poll Brev)  32.8C  Wrist    *4.3 <4.2 5.8 >5 Elbow Wrist 3.8 18.5 *49 >50  Elbow    8.1  1.0  Right Ulnar Motor (Abd Dig Min)  32.2C  Wrist    2.8 <4.2 7.1 >3 B Elbow Wrist 3.0 17.0 57 >53  B Elbow    5.8  7.3  A Elbow B Elbow 1.3 10.0 77 >53  A Elbow    7.1  7.2          EMG   Side Muscle Nerve Root Ins Act Fibs Psw Amp Dur Poly Recrt Int Fraser Din Comment  Right Abd Poll Brev Median C8-T1 Nml Nml Nml Nml Nml 0 Nml Nml   Right 1stDorInt Ulnar C8-T1 Nml Nml Nml Nml Nml 0 Nml Nml   Right PronatorTeres Median C6-7 Nml Nml Nml Nml Nml 0 Nml Nml   Right Biceps Musculocut C5-6 Nml Nml Nml Nml Nml 0 Nml Nml   Right Deltoid Axillary C5-6 Nml Nml Nml Nml Nml 0 Nml  Nml     Nerve Conduction Studies Anti Sensory Left/Right Comparison   Stim Site L Lat (ms) R Lat (ms) L-R Lat (ms) L Amp (V) R Amp (V) L-R Amp (%) Site1 Site2 L Vel (m/s) R Vel (m/s) L-R Vel (m/s)  Median Acr Palm Anti Sensory (2nd Digit)  32.2C  Wrist  *3.8   26.7  Wrist Palm     Palm  1.7   19.9        Radial Anti Sensory (Base 1st Digit)  33.2C  Wrist  2.8   10.2  Wrist Base 1st Digit     Ulnar Anti Sensory (5th Digit)  32.6C  Wrist  3.0   28.6  Wrist 5th Digit  47    Motor Left/Right Comparison   Stim Site L Lat (ms) R Lat (ms) L-R Lat (ms) L Amp (mV) R Amp (mV) L-R Amp (%) Site1 Site2 L Vel (m/s) R Vel (m/s) L-R Vel (m/s)  Median Motor (Abd Poll Brev)  32.8C  Wrist  *4.3   5.8  Elbow Wrist  *49   Elbow  8.1   1.0        Ulnar Motor (Abd Dig Min)  32.2C  Wrist  2.8   7.1  B Elbow Wrist  57   B Elbow  5.8   7.3  A Elbow B Elbow  77   A Elbow  7.1   7.2              Clinical History: No specialty comments available.  She reports that she quit smoking about 12 years ago. She has never used smokeless tobacco.   Recent Labs  05/12/15 0848 08/12/15 0917 11/17/15 1050  HGBA1C 6.1 6.2 6.0*    Objective:  VS:  HT:    WT:   BMI:     BP:   HR: bpm  TEMP: ( )  RESP:  Physical Exam  Musculoskeletal:  Inspection reveals no atrophy of the bilateral APB or FDI or hand intrinsics. There is no swelling, color changes, allodynia or dystrophic changes. There is 5 out of 5 strength in the bilateral wrist extension, finger abduction and long finger flexion. There is intact sensation to light touch in all dermatomal and peripheral nerve distributions. There is a negative Tinel's test at the bilateral wrist and elbow.     Ortho Exam Imaging: No results found.  Past Medical/Family/Surgical/Social History: Medications & Allergies reviewed per EMR Patient Active Problem List   Diagnosis Date Noted  . Plantar fasciitis, left 11/17/2015  . Fatigue 11/17/2015  . Sinusitis  03/10/2015  . Type 2 diabetes mellitus with  vascular disease ([place]) 01/14/2015  . Vitamin D deficiency 01/14/2015  . Left ankle pain 01/13/2015  . Obesity 01/13/2015  . Glucose intolerance (impaired glucose tolerance) 03/29/2013  . Fatty liver disease, nonalcoholic 05/27/6436  . Peripheral vascular disease ([place]) 07/15/2011  . DEPRESSION 08/13/2008  . PERIMENOPAUSAL SYNDROME 05/08/2008  . ALLERGIC RHINITIS 04/10/2008  . Hypothyroidism 08/04/2006  . Dyslipidemia 08/04/2006  . Essential hypertension 08/04/2006   Past Medical History:  Diagnosis Date  . ALLERGIC RHINITIS 04/10/2008  . DEPRESSION 08/13/2008  . HYPERLIPIDEMIA 08/04/2006  . HYPERTENSION 08/04/2006  . HYPOTHYROIDISM 08/04/2006  . LOW BACK PAIN 08/04/2006  . PERIMENOPAUSAL SYNDROME 05/08/2008  . PVD (peripheral vascular disease) ([place])    occlusive, status post bifem bypass 2007   Family History  Problem Relation Age of Onset  . Hypertension Mother   . Heart disease Father   . Hyperlipidemia Father   . Hypertension Father   . Alcohol abuse Brother   . Diabetes Maternal Grandmother   . Alcohol abuse Maternal Uncle   . Stroke Maternal Uncle    Past Surgical History:  Procedure Laterality Date  . CHOLECYSTECTOMY    . FEMORAL BYPASS     bifem.  Marland Kitchen HAGLAND'S DEFORMITY EXCISION    . TUBAL LIGATION     Social History   Occupational History  . Not on file.   Social History Main Topics  . Smoking status: Former Smoker    Quit date: 02/15/2004  . Smokeless tobacco: Never Used  . Alcohol use No  . Drug use: No  . Sexual activity: Yes    Birth control/ protection: Post-menopausal

## 2016-03-11 NOTE — Telephone Encounter (Signed)
Nitka next appt is not until March and pt just wants to know if she can go ahead and schedule surgery if that's what she needs, if she needs to be seen first can we work her in? She wants to go ahead with this.  [phone]

## 2016-03-14 NOTE — Telephone Encounter (Signed)
Nitka next appt is not until March and pt just wants to know if she can go ahead and schedule surgery if that's what she needs, if she needs to be seen first can we work her in? She wants to go ahead with this

## 2016-03-14 NOTE — Procedures (Signed)
EMG & NCV Findings: Evaluation of the right median motor nerve showed prolonged distal onset latency (4.3 ms) and decreased conduction velocity (Elbow-Wrist, 49 m/s).  The right median (across palm) sensory nerve showed prolonged distal peak latency (Wrist, 3.8 ms).  All remaining nerves (as indicated in the following tables) were within normal limits.    All examined muscles (as indicated in the following table) showed no evidence of electrical instability.    Impression: The above electrodiagnostic study is ABNORMAL and reveals evidence of a moderate right median nerve entrapment at the wrist (carpal tunnel syndrome) affecting sensory and motor components. Careful clinical correlation with her symptoms is paramount.  There is no significant electrodiagnostic evidence of any other focal nerve entrapment, brachial plexopathy or cervical radiculopathy.   Recommendations: 1.  Follow-up with referring physician. 2.  Continue current management of symptoms. 3.  Continue use of resting splint at night-time and as needed during the day.   Nerve Conduction Studies Anti Sensory Summary Table   Stim Site NR Peak (ms) Norm Peak (ms) P-T Amp (V) Norm P-T Amp Site1 Site2 Delta-P (ms) Dist (cm) Vel (m/s) Norm Vel (m/s)  Right Median Acr Palm Anti Sensory (2nd Digit)  32.2C  Wrist    *3.8 <3.6 26.7 >10 Wrist Palm 2.1 0.0    Palm    1.7 <2.0 19.9         Right Radial Anti Sensory (Base 1st Digit)  33.2C  Wrist    2.8 <3.1 10.2  Wrist Base 1st Digit 2.8 0.0    Right Ulnar Anti Sensory (5th Digit)  32.6C  Wrist    3.0 <3.7 28.6 >15.0 Wrist 5th Digit 3.0 14.0 47 >38   Motor Summary Table   Stim Site NR Onset (ms) Norm Onset (ms) O-P Amp (mV) Norm O-P Amp Site1 Site2 Delta-0 (ms) Dist (cm) Vel (m/s) Norm Vel (m/s)  Right Median Motor (Abd Poll Brev)  32.8C  Wrist    *4.3 <4.2 5.8 >5 Elbow Wrist 3.8 18.5 *49 >50  Elbow    8.1  1.0         Right Ulnar Motor (Abd Dig Min)  32.2C  Wrist    2.8 <4.2  7.1 >3 B Elbow Wrist 3.0 17.0 57 >53  B Elbow    5.8  7.3  A Elbow B Elbow 1.3 10.0 77 >53  A Elbow    7.1  7.2          EMG   Side Muscle Nerve Root Ins Act Fibs Psw Amp Dur Poly Recrt Int Fraser Din Comment  Right Abd Poll Brev Median C8-T1 Nml Nml Nml Nml Nml 0 Nml Nml   Right 1stDorInt Ulnar C8-T1 Nml Nml Nml Nml Nml 0 Nml Nml   Right PronatorTeres Median C6-7 Nml Nml Nml Nml Nml 0 Nml Nml   Right Biceps Musculocut C5-6 Nml Nml Nml Nml Nml 0 Nml Nml   Right Deltoid Axillary C5-6 Nml Nml Nml Nml Nml 0 Nml Nml     Nerve Conduction Studies Anti Sensory Left/Right Comparison   Stim Site L Lat (ms) R Lat (ms) L-R Lat (ms) L Amp (V) R Amp (V) L-R Amp (%) Site1 Site2 L Vel (m/s) R Vel (m/s) L-R Vel (m/s)  Median Acr Palm Anti Sensory (2nd Digit)  32.2C  Wrist  *3.8   26.7  Wrist Palm     Palm  1.7   19.9        Radial Anti Sensory (Base 1st Digit)  33.2C  Wrist  2.8   10.2  Wrist Base 1st Digit     Ulnar Anti Sensory (5th Digit)  32.6C  Wrist  3.0   28.6  Wrist 5th Digit  47    Motor Left/Right Comparison   Stim Site L Lat (ms) R Lat (ms) L-R Lat (ms) L Amp (mV) R Amp (mV) L-R Amp (%) Site1 Site2 L Vel (m/s) R Vel (m/s) L-R Vel (m/s)  Median Motor (Abd Poll Brev)  32.8C  Wrist  *4.3   5.8  Elbow Wrist  *49   Elbow  8.1   1.0        Ulnar Motor (Abd Dig Min)  32.2C  Wrist  2.8   7.1  B Elbow Wrist  57   B Elbow  5.8   7.3  A Elbow B Elbow  77   A Elbow  7.1   7.2

## 2016-03-14 NOTE — Telephone Encounter (Signed)
Needst obe seen to schedule surgery and to discuss risks and benefits.

## 2016-03-15 NOTE — Telephone Encounter (Signed)
Left message on voicemail that she would need an appointment to eval things, scheduled for 03/24/2016 @ 945. Asked her to call me back and let me know if this did not work and I would reschedule her.

## 2016-03-17 ENCOUNTER — Other Ambulatory Visit: Payer: Self-pay | Admitting: Family Medicine

## 2016-03-24 ENCOUNTER — Ambulatory Visit (INDEPENDENT_AMBULATORY_CARE_PROVIDER_SITE_OTHER): Payer: 59 | Admitting: Specialist

## 2016-03-24 ENCOUNTER — Encounter (INDEPENDENT_AMBULATORY_CARE_PROVIDER_SITE_OTHER): Payer: Self-pay | Admitting: Specialist

## 2016-03-24 ENCOUNTER — Ambulatory Visit (INDEPENDENT_AMBULATORY_CARE_PROVIDER_SITE_OTHER): Payer: Self-pay

## 2016-03-24 VITALS — BP 120/75 | HR 60 | Ht 64.0 in | Wt 202.0 lb

## 2016-03-24 DIAGNOSIS — R200 Anesthesia of skin: Secondary | ICD-10-CM | POA: Diagnosis not present

## 2016-03-24 DIAGNOSIS — G5601 Carpal tunnel syndrome, right upper limb: Secondary | ICD-10-CM | POA: Diagnosis not present

## 2016-03-24 DIAGNOSIS — M542 Cervicalgia: Secondary | ICD-10-CM

## 2016-03-24 DIAGNOSIS — M47812 Spondylosis without myelopathy or radiculopathy, cervical region: Secondary | ICD-10-CM | POA: Diagnosis not present

## 2016-03-24 DIAGNOSIS — R202 Paresthesia of skin: Secondary | ICD-10-CM

## 2016-03-24 MED ORDER — HYDROCODONE-ACETAMINOPHEN 5-325 MG PO TABS: 1.0000 | ORAL_TABLET | Freq: Four times a day (QID) | ORAL | 0 refills | Status: DC | PRN

## 2016-03-24 NOTE — Patient Instructions (Signed)
    May use the carpal tunnel brace for now. We will schedule for a right open Carpal tunnel sndrome. Sherrie Berneda Rose is our scheduler and will contact your to arrange the surgery.

## 2016-03-24 NOTE — Progress Notes (Signed)
Office Visit Note   Patient: [name]           Date of Birth: 1960-06-05           MRN: [id number] Visit Date: 03/24/2016              Requested by: Gregor Hams, MD [address] [place], [place] [id number] PCP: Lynne Leader, MD   Assessment & Plan: Visit Diagnoses:  1. Numbness and tingling of right arm   2. Cervicalgia   3. Carpal tunnel syndrome, right upper limb   4. Spondylosis of cervical region without myelopathy or radiculopathy     Plan:    May use the carpal tunnel brace for now. We will schedule for a right open Carpal tunnel sndrome. [name] is our scheduler and will contact your to arrange the surgery.  Follow-Up Instructions: No Follow-up on file.   Orders:  Orders Placed This Encounter  Procedures  . XR Cervical Spine 2 or 3 views   No orders of the defined types were placed in this encounter.     Procedures: No procedures performed   Clinical Data: No additional findings.   Subjective: Chief Complaint  Patient presents with  . Right Arm - Injury    [name] is here to review her EMG/NCS of her right arm.  She states that these symptoms are the same.  She is also complaining of pain in her mid back that is radiating around to her abdomen at her ribs and she says that her legs are bothering her.Pain into the right posterior axillary area of the scapula along the lateral scapula border, worse with over head use of the right arm and overhead lifting. Sleeping she tosses and turns, has to sleep on the right side at first. Notices some grating with ROM of the cervical right posterior neck.   Injury     Review of Systems  Constitutional: Negative.   HENT: Negative.   Eyes: Negative.   Respiratory: Negative.   Cardiovascular: Negative.   Gastrointestinal: Negative.   Endocrine: Negative.   Genitourinary: Negative.   Musculoskeletal: Negative.   Skin: Negative.   Allergic/Immunologic: Negative.   Neurological:  Negative.   Hematological: Negative.   Psychiatric/Behavioral: Negative.      Objective: Vital Signs: BP 120/75 (BP Location: Left Arm, Patient Position: Sitting)   Pulse 60   Ht 5' 4"  (1.626 m)   Wt 202 lb (91.6 kg)   BMI 34.67 kg/m   Physical Exam  Constitutional: She is oriented to person, place, and time. She appears well-developed and well-nourished.  HENT:  Head: Normocephalic and atraumatic.  Eyes: EOM are normal. Pupils are equal, round, and reactive to light.  Neck: Normal range of motion. Neck supple.  Pulmonary/Chest: Effort normal and breath sounds normal.  Abdominal: Soft. Bowel sounds are normal.  Musculoskeletal: Normal range of motion.  Neurological: She is alert and oriented to person, place, and time.  Skin: Skin is warm and dry.  Psychiatric: She has a normal mood and affect. Her behavior is normal. Judgment and thought content normal.    Ortho Exam  Specialty Comments:  No specialty comments available.  Imaging: No results found.   PMFS History: Patient Active Problem List   Diagnosis Date Noted  . Plantar fasciitis, left 11/17/2015  . Fatigue 11/17/2015  . Sinusitis 03/10/2015  . Type 2 diabetes mellitus with vascular disease ([place]) 01/14/2015  . Vitamin D deficiency 01/14/2015  . Left ankle  pain 01/13/2015  . Obesity 01/13/2015  . Glucose intolerance (impaired glucose tolerance) 03/29/2013  . Fatty liver disease, nonalcoholic 27/61/8485  . Peripheral vascular disease ([place]) 07/15/2011  . DEPRESSION 08/13/2008  . PERIMENOPAUSAL SYNDROME 05/08/2008  . ALLERGIC RHINITIS 04/10/2008  . Hypothyroidism 08/04/2006  . Dyslipidemia 08/04/2006  . Essential hypertension 08/04/2006   Past Medical History:  Diagnosis Date  . ALLERGIC RHINITIS 04/10/2008  . DEPRESSION 08/13/2008  . HYPERLIPIDEMIA 08/04/2006  . HYPERTENSION 08/04/2006  . HYPOTHYROIDISM 08/04/2006  . LOW BACK PAIN 08/04/2006  . PERIMENOPAUSAL SYNDROME 05/08/2008  . PVD (peripheral  vascular disease) ([place])    occlusive, status post bifem bypass 2007    Family History  Problem Relation Age of Onset  . Hypertension Mother   . Heart disease Father   . Hyperlipidemia Father   . Hypertension Father   . Alcohol abuse Brother   . Diabetes Maternal Grandmother   . Alcohol abuse Maternal Uncle   . Stroke Maternal Uncle     Past Surgical History:  Procedure Laterality Date  . CHOLECYSTECTOMY    . FEMORAL BYPASS     bifem.  Marland Kitchen HAGLAND'S DEFORMITY EXCISION    . TUBAL LIGATION     Social History   Occupational History  . Not on file.   Social History Main Topics  . Smoking status: Former Smoker    Quit date: 02/15/2004  . Smokeless tobacco: Never Used  . Alcohol use No  . Drug use: No  . Sexual activity: Yes    Birth control/ protection: Post-menopausal

## 2016-04-01 ENCOUNTER — Encounter (HOSPITAL_BASED_OUTPATIENT_CLINIC_OR_DEPARTMENT_OTHER): Payer: Self-pay | Admitting: *Deleted

## 2016-04-04 ENCOUNTER — Encounter (HOSPITAL_BASED_OUTPATIENT_CLINIC_OR_DEPARTMENT_OTHER): Payer: Self-pay | Admitting: Anesthesiology

## 2016-04-04 NOTE — Anesthesia Preprocedure Evaluation (Addendum)
Anesthesia Evaluation  Patient identified by MRN, date of birth, ID band Patient awake    Reviewed: Allergy & Precautions, NPO status , Patient's Chart, lab work & pertinent test results, reviewed documented beta blocker date and time   Airway Mallampati: I  TM Distance: >3 FB Neck ROM: Full    Dental  (+) Upper Dentures, Dental Advisory Given   Pulmonary former smoker,    breath sounds clear to auscultation       Cardiovascular hypertension, Pt. on medications and Pt. on home beta blockers + Peripheral Vascular Disease   Rhythm:Regular Rate:Normal     Neuro/Psych PSYCHIATRIC DISORDERS Depression  Neuromuscular disease    GI/Hepatic negative GI ROS, Neg liver ROS,   Endo/Other  diabetesHypothyroidism   Renal/GU negative Renal ROS  negative genitourinary   Musculoskeletal negative musculoskeletal ROS (+)   Abdominal   Peds negative pediatric ROS (+)  Hematology negative hematology ROS (+)   Anesthesia Other Findings - HLD  Reproductive/Obstetrics negative OB ROS                            Anesthesia Physical Anesthesia Plan  ASA: II  Anesthesia Plan: Bier Block   Post-op Pain Management:    Induction: Intravenous  Airway Management Planned: Natural Airway  Additional Equipment:   Intra-op Plan:   Post-operative Plan:   Informed Consent: I have reviewed the patients History and Physical, chart, labs and discussed the procedure including the risks, benefits and alternatives for the proposed anesthesia with the patient or authorized representative who has indicated his/her understanding and acceptance.   Dental advisory given  Plan Discussed with: CRNA  Anesthesia Plan Comments:         Anesthesia Quick Evaluation

## 2016-04-05 ENCOUNTER — Ambulatory Visit (HOSPITAL_BASED_OUTPATIENT_CLINIC_OR_DEPARTMENT_OTHER)
Admission: RE | Admit: 2016-04-05 | Discharge: 2016-04-05 | Disposition: A | Discharge status: Home / Self Care | Payer: PRIVATE HEALTH INSURANCE | Source: Ambulatory Visit | Attending: Specialist | Admitting: Specialist

## 2016-04-05 ENCOUNTER — Encounter (HOSPITAL_BASED_OUTPATIENT_CLINIC_OR_DEPARTMENT_OTHER): Payer: Self-pay

## 2016-04-05 ENCOUNTER — Encounter (HOSPITAL_BASED_OUTPATIENT_CLINIC_OR_DEPARTMENT_OTHER)
Admission: RE | Disposition: A | Discharge status: Home / Self Care | Payer: Self-pay | Source: Ambulatory Visit | Attending: Specialist

## 2016-04-05 ENCOUNTER — Ambulatory Visit (HOSPITAL_BASED_OUTPATIENT_CLINIC_OR_DEPARTMENT_OTHER): Payer: PRIVATE HEALTH INSURANCE | Admitting: Anesthesiology

## 2016-04-05 DIAGNOSIS — R202 Paresthesia of skin: Secondary | ICD-10-CM

## 2016-04-05 DIAGNOSIS — E1151 Type 2 diabetes mellitus with diabetic peripheral angiopathy without gangrene: Secondary | ICD-10-CM | POA: Insufficient documentation

## 2016-04-05 DIAGNOSIS — I10 Essential (primary) hypertension: Secondary | ICD-10-CM | POA: Insufficient documentation

## 2016-04-05 DIAGNOSIS — Z79899 Other long term (current) drug therapy: Secondary | ICD-10-CM | POA: Diagnosis not present

## 2016-04-05 DIAGNOSIS — G5601 Carpal tunnel syndrome, right upper limb: Secondary | ICD-10-CM | POA: Diagnosis not present

## 2016-04-05 DIAGNOSIS — E039 Hypothyroidism, unspecified: Secondary | ICD-10-CM | POA: Diagnosis not present

## 2016-04-05 DIAGNOSIS — E785 Hyperlipidemia, unspecified: Secondary | ICD-10-CM | POA: Insufficient documentation

## 2016-04-05 DIAGNOSIS — F329 Major depressive disorder, single episode, unspecified: Secondary | ICD-10-CM | POA: Diagnosis not present

## 2016-04-05 DIAGNOSIS — Z87891 Personal history of nicotine dependence: Secondary | ICD-10-CM | POA: Diagnosis not present

## 2016-04-05 DIAGNOSIS — M79641 Pain in right hand: Secondary | ICD-10-CM | POA: Diagnosis present

## 2016-04-05 DIAGNOSIS — M542 Cervicalgia: Secondary | ICD-10-CM

## 2016-04-05 DIAGNOSIS — R200 Anesthesia of skin: Secondary | ICD-10-CM

## 2016-04-05 HISTORY — DX: Type 2 diabetes mellitus without complications: E11.9

## 2016-04-05 HISTORY — PX: CARPAL TUNNEL RELEASE: SHX101

## 2016-04-05 HISTORY — DX: Carpal tunnel syndrome, right upper limb: G56.01

## 2016-04-05 HISTORY — DX: Myoneural disorder, unspecified: G70.9

## 2016-04-05 SURGERY — CARPAL TUNNEL RELEASE
Anesthesia: Regional | Site: Wrist | Laterality: Right

## 2016-04-05 MED ORDER — LIDOCAINE 2% (20 MG/ML) 5 ML SYRINGE
INTRAMUSCULAR | Status: AC
  Filled 2016-04-05: qty 5

## 2016-04-05 MED ORDER — OXYCODONE HCL 5 MG PO TABS
5.0000 mg | ORAL_TABLET | Freq: Once | ORAL | Status: AC | PRN
  Administered 2016-04-05: 5 mg via ORAL

## 2016-04-05 MED ORDER — FENTANYL CITRATE (PF) 100 MCG/2ML IJ SOLN: 25.0000 ug | INTRAMUSCULAR | Status: DC | PRN

## 2016-04-05 MED ORDER — LIDOCAINE HCL (PF) 0.5 % IJ SOLN
INTRAMUSCULAR | Status: DC | PRN
  Administered 2016-04-05: 30 mL via INTRAVENOUS

## 2016-04-05 MED ORDER — OXYCODONE HCL 5 MG/5ML PO SOLN: 5.0000 mg | Freq: Once | ORAL | Status: AC | PRN

## 2016-04-05 MED ORDER — MIDAZOLAM HCL 5 MG/5ML IJ SOLN
INTRAMUSCULAR | Status: DC | PRN
  Administered 2016-04-05 (×2): 1 mg via INTRAVENOUS

## 2016-04-05 MED ORDER — LIDOCAINE HCL (CARDIAC) 20 MG/ML IV SOLN
INTRAVENOUS | Status: DC | PRN
  Administered 2016-04-05: 10 mg via INTRAVENOUS

## 2016-04-05 MED ORDER — BUPIVACAINE HCL (PF) 0.5 % IJ SOLN
INTRAMUSCULAR | Status: DC | PRN
  Administered 2016-04-05: 2 mL
  Administered 2016-04-05: 8 mL

## 2016-04-05 MED ORDER — MEPERIDINE HCL 25 MG/ML IJ SOLN: 6.2500 mg | INTRAMUSCULAR | Status: DC | PRN

## 2016-04-05 MED ORDER — GLYCOPYRROLATE 0.2 MG/ML IJ SOLN
INTRAMUSCULAR | Status: DC | PRN
  Administered 2016-04-05: 0.1 mg via INTRAVENOUS

## 2016-04-05 MED ORDER — CHLORHEXIDINE GLUCONATE 4 % EX LIQD: 60.0000 mL | Freq: Once | CUTANEOUS | Status: DC

## 2016-04-05 MED ORDER — PROPOFOL 10 MG/ML IV BOLUS
INTRAVENOUS | Status: DC | PRN
  Administered 2016-04-05: 20 mg via INTRAVENOUS

## 2016-04-05 MED ORDER — SCOPOLAMINE 1 MG/3DAYS TD PT72
1.0000 | MEDICATED_PATCH | Freq: Once | TRANSDERMAL | Status: DC | PRN
Start: 2016-04-05 — End: 2016-04-05

## 2016-04-05 MED ORDER — OXYCODONE HCL 5 MG PO TABS
ORAL_TABLET | ORAL | Status: AC
  Filled 2016-04-05: qty 1

## 2016-04-05 MED ORDER — FENTANYL CITRATE (PF) 100 MCG/2ML IJ SOLN
INTRAMUSCULAR | Status: DC | PRN
  Administered 2016-04-05 (×2): 50 ug via INTRAVENOUS

## 2016-04-05 MED ORDER — ONDANSETRON HCL 4 MG/2ML IJ SOLN
INTRAMUSCULAR | Status: AC
  Filled 2016-04-05: qty 2

## 2016-04-05 MED ORDER — HYDROCODONE-ACETAMINOPHEN 5-325 MG PO TABS: 1.0000 | ORAL_TABLET | ORAL | 0 refills | Status: DC | PRN

## 2016-04-05 MED ORDER — PROPOFOL 500 MG/50ML IV EMUL
INTRAVENOUS | Status: AC
  Filled 2016-04-05: qty 50

## 2016-04-05 MED ORDER — FENTANYL CITRATE (PF) 100 MCG/2ML IJ SOLN: 50.0000 ug | INTRAMUSCULAR | Status: DC | PRN

## 2016-04-05 MED ORDER — FENTANYL CITRATE (PF) 100 MCG/2ML IJ SOLN
INTRAMUSCULAR | Status: AC
  Filled 2016-04-05: qty 2

## 2016-04-05 MED ORDER — MIDAZOLAM HCL 2 MG/2ML IJ SOLN
INTRAMUSCULAR | Status: AC
  Filled 2016-04-05: qty 2

## 2016-04-05 MED ORDER — CEFAZOLIN SODIUM-DEXTROSE 2-4 GM/100ML-% IV SOLN: 2.0000 g | INTRAVENOUS | Status: DC

## 2016-04-05 MED ORDER — PROMETHAZINE HCL 25 MG/ML IJ SOLN: 6.2500 mg | INTRAMUSCULAR | Status: DC | PRN

## 2016-04-05 MED ORDER — LIDOCAINE HCL (PF) 1 % IJ SOLN
INTRAMUSCULAR | Status: AC
  Filled 2016-04-05: qty 30

## 2016-04-05 MED ORDER — LACTATED RINGERS IV SOLN: INTRAVENOUS | Status: DC

## 2016-04-05 MED ORDER — BUPIVACAINE HCL (PF) 0.5 % IJ SOLN
INTRAMUSCULAR | Status: AC
  Filled 2016-04-05: qty 30

## 2016-04-05 MED ORDER — MIDAZOLAM HCL 2 MG/2ML IJ SOLN: 1.0000 mg | INTRAMUSCULAR | Status: DC | PRN

## 2016-04-05 MED ORDER — ONDANSETRON HCL 4 MG/2ML IJ SOLN
INTRAMUSCULAR | Status: DC | PRN
  Administered 2016-04-05: 4 mg via INTRAVENOUS

## 2016-04-05 MED ORDER — LACTATED RINGERS IV SOLN
INTRAVENOUS | Status: DC
  Administered 2016-04-05: 07:00:00 via INTRAVENOUS

## 2016-04-05 SURGICAL SUPPLY — 57 items
ADH SKN CLS APL DERMABOND .7 (GAUZE/BANDAGES/DRESSINGS) ×1
APL SKNCLS STERI-STRIP NONHPOA (GAUZE/BANDAGES/DRESSINGS)
BANDAGE ACE 3X5.8 VEL STRL LF (GAUZE/BANDAGES/DRESSINGS) ×2 IMPLANT
BENZOIN TINCTURE PRP APPL 2/3 (GAUZE/BANDAGES/DRESSINGS) IMPLANT
BLADE SURG 15 STRL LF DISP TIS (BLADE) ×1 IMPLANT
BLADE SURG 15 STRL SS (BLADE) ×2
BNDG CMPR 9X4 STRL LF SNTH (GAUZE/BANDAGES/DRESSINGS) ×1
BNDG ESMARK 4X9 LF (GAUZE/BANDAGES/DRESSINGS) ×1 IMPLANT
CORDS BIPOLAR (ELECTRODE) ×2 IMPLANT
COVER BACK TABLE 60X90IN (DRAPES) ×2 IMPLANT
COVER MAYO STAND STRL (DRAPES) ×2 IMPLANT
CUFF TOURNIQUET SINGLE 18IN (TOURNIQUET CUFF) ×2 IMPLANT
DERMABOND ADVANCED (GAUZE/BANDAGES/DRESSINGS) ×1
DERMABOND ADVANCED .7 DNX12 (GAUZE/BANDAGES/DRESSINGS) ×1 IMPLANT
DRAPE EXTREMITY T 121X128X90 (DRAPE) ×2 IMPLANT
DRAPE SURG 17X23 STRL (DRAPES) ×2 IMPLANT
DRSG EMULSION OIL 3X3 NADH (GAUZE/BANDAGES/DRESSINGS) ×2 IMPLANT
DRSG PAD ABDOMINAL 8X10 ST (GAUZE/BANDAGES/DRESSINGS) ×2 IMPLANT
DURAPREP 26ML APPLICATOR (WOUND CARE) ×2 IMPLANT
ELECT REM PT RETURN 9FT ADLT (ELECTROSURGICAL)
ELECTRODE REM PT RTRN 9FT ADLT (ELECTROSURGICAL) IMPLANT
GAUZE SPONGE 4X4 12PLY STRL (GAUZE/BANDAGES/DRESSINGS) ×2 IMPLANT
GLOVE BIO SURGEON STRL SZ 6.5 (GLOVE) ×1 IMPLANT
GLOVE BIOGEL PI IND STRL 6.5 (GLOVE) ×1 IMPLANT
GLOVE BIOGEL PI IND STRL 7.0 (GLOVE) IMPLANT
GLOVE BIOGEL PI IND STRL 8 (GLOVE) ×1 IMPLANT
GLOVE BIOGEL PI IND STRL 9 (GLOVE) ×1 IMPLANT
GLOVE BIOGEL PI INDICATOR 6.5 (GLOVE) ×1
GLOVE BIOGEL PI INDICATOR 7.0 (GLOVE) ×2
GLOVE BIOGEL PI INDICATOR 8 (GLOVE) ×1
GLOVE BIOGEL PI INDICATOR 9 (GLOVE) ×1
GLOVE ORTHO TXT STRL SZ7.5 (GLOVE) ×2 IMPLANT
GLOVE SURG SS PI 6.0 STRL IVOR (GLOVE) ×1 IMPLANT
GOWN STRL REUS W/ TWL LRG LVL3 (GOWN DISPOSABLE) ×1 IMPLANT
GOWN STRL REUS W/TWL 2XL LVL3 (GOWN DISPOSABLE) ×2 IMPLANT
GOWN STRL REUS W/TWL LRG LVL3 (GOWN DISPOSABLE) ×2
NEEDLE HYPO 22GX1.5 SAFETY (NEEDLE) ×1 IMPLANT
PACK BASIN DAY SURGERY FS (CUSTOM PROCEDURE TRAY) ×2 IMPLANT
PAD CAST 3X4 CTTN HI CHSV (CAST SUPPLIES) ×1 IMPLANT
PADDING CAST ABS 4INX4YD NS (CAST SUPPLIES)
PADDING CAST ABS COTTON 4X4 ST (CAST SUPPLIES) ×1 IMPLANT
PADDING CAST COTTON 3X4 STRL (CAST SUPPLIES) ×2
PENCIL BUTTON HOLSTER BLD 10FT (ELECTRODE) IMPLANT
RUBBERBAND STERILE (MISCELLANEOUS) IMPLANT
SHEET MEDIUM DRAPE 40X70 STRL (DRAPES) ×1 IMPLANT
SPLINT FIBERGLASS 3X35 (CAST SUPPLIES) ×2 IMPLANT
SPLINT PLASTER CAST XFAST 3X15 (CAST SUPPLIES) IMPLANT
SPLINT PLASTER XTRA FASTSET 3X (CAST SUPPLIES)
SPONGE GAUZE 4X4 12PLY STER LF (GAUZE/BANDAGES/DRESSINGS) IMPLANT
STOCKINETTE 4X48 STRL (DRAPES) ×2 IMPLANT
STRIP CLOSURE SKIN 1/2X4 (GAUZE/BANDAGES/DRESSINGS) IMPLANT
SUT ETHILON 4 0 PS 2 18 (SUTURE) ×2 IMPLANT
SUT VIC AB 3-0 FS2 27 (SUTURE) IMPLANT
SYR BULB 3OZ (MISCELLANEOUS) ×1 IMPLANT
SYR CONTROL 10ML LL (SYRINGE) ×1 IMPLANT
TOWEL OR 17X24 6PK STRL BLUE (TOWEL DISPOSABLE) ×2 IMPLANT
TOWEL OR NON WOVEN STRL DISP B (DISPOSABLE) ×1 IMPLANT

## 2016-04-05 NOTE — Anesthesia Procedure Notes (Signed)
Procedure Name: MAC Date/Time: 04/05/2016 7:32 AM Performed by: Marrianne Mood Pre-anesthesia Checklist: Patient identified, Timeout performed, Emergency Drugs available, Suction available and Patient being monitored Patient Re-evaluated:Patient Re-evaluated prior to inductionOxygen Delivery Method: Simple face mask

## 2016-04-05 NOTE — Anesthesia Postprocedure Evaluation (Addendum)
Anesthesia Post Note  Patient: [name]  Procedure(s) Performed: Procedure(s) (LRB): OPEN RIGHT CARPAL TUNNEL RELEASE (Right)  Patient location during evaluation: PACU Anesthesia Type: Bier Block Level of consciousness: awake and alert Pain management: pain level controlled Vital Signs Assessment: post-procedure vital signs reviewed and stable Respiratory status: spontaneous breathing, nonlabored ventilation, respiratory function stable and patient connected to nasal cannula oxygen Cardiovascular status: stable and blood pressure returned to baseline Anesthetic complications: no       Last Vitals:  Vitals:   04/05/16 0847 04/05/16 0901  BP:  132/80  Pulse: (!) 56 64  Resp: 16 18  Temp:  36.6 C    Last Pain:  Vitals:   04/05/16 0901  TempSrc: Oral  PainSc: 4                  Effie Berkshire

## 2016-04-05 NOTE — Transfer of Care (Signed)
Immediate Anesthesia Transfer of Care Note  Patient: [name]  Procedure(s) Performed: Procedure(s): OPEN RIGHT CARPAL TUNNEL RELEASE (Right)  Patient Location: PACU  Anesthesia Type:Bier block  Level of Consciousness: awake and patient cooperative  Airway & Oxygen Therapy: Patient Spontanous Breathing and Patient connected to face mask oxygen  Post-op Assessment: Report given to RN and Post -op Vital signs reviewed and stable  Post vital signs: Reviewed and stable  Last Vitals:  Vitals:   04/05/16 0816 04/05/16 0817  BP: (!) 125/59   Pulse: 68 64  Resp:  16  Temp:      Last Pain:  Vitals:   04/05/16 0646  TempSrc: Oral  PainSc: 3       Patients Stated Pain Goal: 3 (94/17/91 9957)  Complications: No apparent anesthesia complications

## 2016-04-05 NOTE — Op Note (Signed)
04/05/2016  8:16 AM  PATIENT:  [name]  56 y.o. female  MRN: [id number]   OPERATIVE REPORT    PRE-OPERATIVE DIAGNOSIS:  Right carpal tunnel syndrome  POST-OPERATIVE DIAGNOSIS:  Right carpal tunnel syndrome  PROCEDURE:  Procedure(s): OPEN RIGHT CARPAL TUNNEL RELEASE    SURGEON:  Jessy Oto, MD      ASSISTANT:  Benjiman Core, PA-C  (Present throughout the entire procedure and necessary for completion of procedure in a timely manner)      ANESTHESIA:  Regional Bier Block  Right upper forearm Level, Supplemented with local Marcaine 0.5 % 32RJ     COMPLICATIONS:  None.   EBL: <5cc     TOURNIQUET TIME: 23 minutes at  250 mmHg   PROCEDURE: The patient was met in the holding area, and the appropriate right wrist identified and marked with an "x" and my initials.   The patient was then transported to OR and was placed on the operative table in a supine position. The patient was then placed under Bier block anesthesia without difficulty. The patient received appropriate preoperative antibiotic prophylaxis.     The right upper extremity was then prepped using sterile conditions and draped using sterile technique.  Time-out procedure was called and correct  Skin and subcutaneous tissue infiltrated with marcaine 0.5% total 10 cc.Using loope magnification and head lamp a 1.5 inch incision curved at the wrist crease with 15 blade scalpel.  Incision through skin and subcutaneous tissue to the volar forearm fascia and  transverse carpal ligament. Fascia then carefully lifed and incised with Stevens scissors inline with the fourth digit. The skin and subcutaneous tissue retracted and the volar fascia divided under direct vision from distal to proximal. A freer elevator then carefully placed between the median nerve and the transverse carpal ligament protecting the  median nerve as the transverse carpal ligament was divided with a 15 blade scalpel in line with the fourth digit. Retracting the  distal skin and subcutaneous tissues distally under direct visualization the remaining portions of the transverse carpal ligament were divided with tenotomy scissors again in line with the fourth digit. The palmar fascia was then divided until the traversing superficial palmar arch was identified and preserved intact.  The motor branch of the median nerve was carefully examined and identified intact. Tourniquet was then released. Bleeding controlled with bipolar electrocautery. The incision was then irrigated with copious amounts of irrigant solution, No active bleeding was present. The incision closed with a single layer skin closure of 4-0 nylon horizontal mattress sutures. Dry dressing of adaptic, 4x4s held in place with sterile webril.  A well padded volar splint applied with ace wrap.  The patient reactivated and returned to the PACU in good condition.  All instruments and sponge counts were correct.          Jessy Oto  04/05/2016, 8:16 AM

## 2016-04-05 NOTE — H&P (Signed)
[name] is an 56 y.o. female.   Chief Complaint:  Right hand pain,numbness and tingling  HPI:  Patient with hx of carpal tunnel syndrome and above complaints presents to day surgery today.  Worsening symptoms.  Failed conservative treatment.    Past Medical History:  Diagnosis Date  . ALLERGIC RHINITIS 04/10/2008  . Carpal tunnel syndrome of right wrist   . DEPRESSION 08/13/2008  . Diabetes mellitus without complication ([place])   . HYPERLIPIDEMIA 08/04/2006  . HYPERTENSION 08/04/2006  . HYPOTHYROIDISM 08/04/2006  . LOW BACK PAIN 08/04/2006  . Neuromuscular disorder ([place])   . PERIMENOPAUSAL SYNDROME 05/08/2008  . PVD (peripheral vascular disease) ([place])    occlusive, status post bifem bypass 2007    Past Surgical History:  Procedure Laterality Date  . CHOLECYSTECTOMY    . FEMORAL BYPASS     bifem.  Marland Kitchen HAGLAND'S DEFORMITY EXCISION    . TUBAL LIGATION      Family History  Problem Relation Age of Onset  . Hypertension Mother   . Heart disease Father   . Hyperlipidemia Father   . Hypertension Father   . Alcohol abuse Brother   . Diabetes Maternal Grandmother   . Alcohol abuse Maternal Uncle   . Stroke Maternal Uncle    Social History:  reports that she quit smoking about 12 years ago. She has never used smokeless tobacco. She reports that she does not drink alcohol or use drugs.  Allergies:  Allergies  Allergen Reactions  . Gluten Meal Diarrhea  . Influenza Virus Vacc Split Pf Other (See Comments)    Swelling around injection site  . Lipitor [Atorvastatin] Other (See Comments)    Dizziness.     Medications Prior to Admission  Medication Sig Dispense Refill  . benazepril (LOTENSIN) 10 MG tablet Take 1 tablet (10 mg total) by mouth daily. 90 tablet 3  . diclofenac sodium (VOLTAREN) 1 % GEL Apply 2 g topically 4 (four) times daily. To affected joint. 100 g 11  . fexofenadine (ALLEGRA) 180 MG tablet Take 1 tablet (180 mg total) by mouth daily. 30 tablet 12  .  HYDROcodone-acetaminophen (NORCO/VICODIN) 5-325 MG tablet Take 1 tablet by mouth every 6 (six) hours as needed for moderate pain. 30 tablet 0  . ipratropium (ATROVENT) 0.03 % nasal spray Place 2 sprays into both nostrils 3 (three) times daily as needed for rhinitis. 30 mL 0  . levothyroxine (SYNTHROID, LEVOTHROID) 75 MCG tablet Take 1 tablet (75 mcg total) by mouth daily before breakfast. NEED LAB WORK FOR MORE REFILLS 90 tablet 0  . metoprolol (LOPRESSOR) 50 MG tablet Take 1 tablet (50 mg total) by mouth 2 (two) times daily. 180 tablet 3  . venlafaxine XR (EFFEXOR-XR) 75 MG 24 hr capsule Take 1 capsule (75 mg total) by mouth daily with breakfast. Patient needs to schedule a follow up appointment before more refills. 90 capsule 3  . pravastatin (PRAVACHOL) 80 MG tablet Take 1 tablet (80 mg total) by mouth daily. 90 tablet 3    No results found for this or any previous visit (from the past 48 hour(s)). No results found.  Review of Systems  Constitutional: Negative.   HENT: Negative.   Respiratory: Negative.   Cardiovascular: Negative.   Gastrointestinal: Negative.   Genitourinary: Negative.   Skin: Negative.   Neurological: Positive for tingling.  Psychiatric/Behavioral: Negative.     Blood pressure 99/78, pulse (!) 53, temperature 98 F (36.7 C), temperature source Oral, resp. rate 18, height 5' 4"  (1.626  m), weight 213 lb 2 oz (96.7 kg), SpO2 98 %. Physical Exam  Constitutional: She is oriented to person, place, and time. She appears well-developed. No distress.  HENT:  Head: Normocephalic and atraumatic.  Eyes: EOM are normal. Pupils are equal, round, and reactive to light.  Respiratory: No respiratory distress.  GI: She exhibits no distension.  Musculoskeletal: She exhibits tenderness.  Positive tinel's and phalen's right wrist  Neurological: She is alert and oriented to person, place, and time.  Skin: Skin is warm and dry.  Psychiatric: She has a normal mood and affect.      Assessment/Plan Right carpal tunnel syndrome  Will proceed with right carpal tunnel release as scheduled.  Procedure along with possible risks and complications discussed.  All questions answered.    Benjiman Core, PA-C 04/05/2016, 7:06 AM

## 2016-04-05 NOTE — Discharge Instructions (Signed)

## 2016-04-05 NOTE — Interval H&P Note (Signed)
History and Physical Interval Note:  04/05/2016 7:22 AM  [name]  has presented today for surgery, with the diagnosis of Right carpal tunnel syndrome  The various methods of treatment have been discussed with the patient and family. After consideration of risks, benefits and other options for treatment, the patient has consented to  Procedure(s): OPEN RIGHT CARPAL TUNNEL RELEASE (Right) as a surgical intervention .  The patient's history has been reviewed, patient examined, no change in status, stable for surgery.  I have reviewed the patient's chart and labs.  Questions were answered to the patient's satisfaction.     Jessy Oto

## 2016-04-05 NOTE — Brief Op Note (Signed)
04/05/2016  8:13 AM  PATIENT:  [name]  57 y.o. female  PRE-OPERATIVE DIAGNOSIS:  Right carpal tunnel syndrome  POST-OPERATIVE DIAGNOSIS:  Right carpal tunnel syndrome  PROCEDURE:  Procedure(s): OPEN RIGHT CARPAL TUNNEL RELEASE (Right)  SURGEON:  Surgeon(s) and Role:    * Jessy Oto, MD - Primary  PHYSICIAN ASSISTANT:Araina Butrick Owens,PA-C   ANESTHESIA:   regional and IV sedation  EBL:  Total I/O In: 800 [I.V.:800] Out: 1 [Blood:1]  BLOOD ADMINISTERED:none  DRAINS: none   LOCAL MEDICATIONS USED:  MARCAINE 0.5%    and Amount: 10 ml  SPECIMEN:  No Specimen  DISPOSITION OF SPECIMEN:  N/A  COUNTS:  YES  TOURNIQUET:   Total Tourniquet Time Documented: Forearm (Right) - 23 minutes Total: Forearm (Right) - 23 minutes   DICTATION: .Viviann Spare Dictation  PLAN OF CARE: Discharge to home after PACU  PATIENT DISPOSITION:  PACU - hemodynamically stable.   Delay start of Pharmacological VTE agent (>24hrs) due to surgical blood loss or risk of bleeding: not applicable

## 2016-04-06 ENCOUNTER — Encounter (HOSPITAL_BASED_OUTPATIENT_CLINIC_OR_DEPARTMENT_OTHER): Payer: Self-pay | Admitting: Specialist

## 2016-04-19 ENCOUNTER — Ambulatory Visit (INDEPENDENT_AMBULATORY_CARE_PROVIDER_SITE_OTHER): Payer: Self-pay | Admitting: Surgery

## 2016-04-19 ENCOUNTER — Encounter (INDEPENDENT_AMBULATORY_CARE_PROVIDER_SITE_OTHER): Payer: Self-pay | Admitting: Surgery

## 2016-04-19 VITALS — BP 129/83 | HR 59 | Ht 64.0 in | Wt 202.0 lb

## 2016-04-19 DIAGNOSIS — Z9889 Other specified postprocedural states: Secondary | ICD-10-CM

## 2016-04-19 NOTE — Progress Notes (Signed)
Post-Op Visit Note   Patient: [name]           Date of Birth: [birth year]           MRN: [id number] Visit Date: 04/19/2016 PCP: Lynne Leader, MD   Assessment & Plan:  Chief Complaint:  Chief Complaint  Patient presents with  . Right Wrist - Routine Post Op   Visit Diagnoses:  1. S/P carpal tunnel release     Plan: Patient doing very well. Follow-up the office in 4 weeks for recheck but if she is doing well may cancel that appointment. She has a removable wrist splint that she will use. Continue to work gentle range of motion of her wrist and hand and gradually increase her activity but nothing too aggressive too quickly. Given a note to return back to work regular duty today.  Follow-Up Instructions: Return in about 4 weeks (around 05/17/2016).   Orders:  No orders of the defined types were placed in this encounter.  No orders of the defined types were placed in this encounter.  HPI Patient presents for her two week post op visit. She is status post right carpal tunnel release on 04/05/2016. She is doing well. The incision looks good, sutures are intact. She brought her wrist splint from home with her per Dr. Otho Ket instructions. She has not had to take the pain medication in the last few days.    Exam Pleasant female alert and oriented in no acute distress. Right hand sutures removed and Steri-Strips applied. Incision healing very well without signs of infection. Minimal swelling.    Imaging: No results found.  PMFS History: Patient Active Problem List   Diagnosis Date Noted  . Carpal tunnel syndrome, right upper limb 04/05/2016    Class: Chronic  . Plantar fasciitis, left 11/17/2015  . Fatigue 11/17/2015  . Sinusitis 03/10/2015  . Type 2 diabetes mellitus with vascular disease ([place]) 01/14/2015  . Vitamin D deficiency 01/14/2015  . Left ankle pain 01/13/2015  . Obesity 01/13/2015  . Glucose intolerance (impaired glucose tolerance) 03/29/2013  . Fatty liver  disease, nonalcoholic 17/51/0258  . Peripheral vascular disease ([place]) 07/15/2011  . DEPRESSION 08/13/2008  . PERIMENOPAUSAL SYNDROME 05/08/2008  . ALLERGIC RHINITIS 04/10/2008  . Hypothyroidism 08/04/2006  . Dyslipidemia 08/04/2006  . Essential hypertension 08/04/2006   Past Medical History:  Diagnosis Date  . ALLERGIC RHINITIS 04/10/2008  . Carpal tunnel syndrome of right wrist   . DEPRESSION 08/13/2008  . Diabetes mellitus without complication ([place])   . HYPERLIPIDEMIA 08/04/2006  . HYPERTENSION 08/04/2006  . HYPOTHYROIDISM 08/04/2006  . LOW BACK PAIN 08/04/2006  . Neuromuscular disorder ([place])   . PERIMENOPAUSAL SYNDROME 05/08/2008  . PVD (peripheral vascular disease) ([place])    occlusive, status post bifem bypass 2007    Family History  Problem Relation Age of Onset  . Hypertension Mother   . Heart disease Father   . Hyperlipidemia Father   . Hypertension Father   . Alcohol abuse Brother   . Diabetes Maternal Grandmother   . Alcohol abuse Maternal Uncle   . Stroke Maternal Uncle     Past Surgical History:  Procedure Laterality Date  . CARPAL TUNNEL RELEASE Right 04/05/2016   Procedure: OPEN RIGHT CARPAL TUNNEL RELEASE;  Surgeon: Jessy Oto, MD;  Location: [facility];  Service: Orthopedics;  Laterality: Right;  . CHOLECYSTECTOMY    . FEMORAL BYPASS     bifem.  Marland Kitchen HAGLAND'S DEFORMITY EXCISION    . TUBAL  LIGATION     Social History   Occupational History  . Not on file.   Social History Main Topics  . Smoking status: Former Smoker    Quit date: 02/15/2004  . Smokeless tobacco: Never Used  . Alcohol use No  . Drug use: No  . Sexual activity: Yes    Birth control/ protection: Post-menopausal

## 2016-05-20 ENCOUNTER — Ambulatory Visit (INDEPENDENT_AMBULATORY_CARE_PROVIDER_SITE_OTHER): Payer: 59 | Admitting: Specialist

## 2016-05-23 ENCOUNTER — Telehealth: Payer: Self-pay

## 2016-05-23 NOTE — Telephone Encounter (Signed)
Pt stated that [facility] sent paperwork here for you to fill out regarding her Carpel Tunnel.  Did you receive it and if so, She is asking how long it will be before it is sent back?  Please advise.

## 2016-05-24 ENCOUNTER — Ambulatory Visit (INDEPENDENT_AMBULATORY_CARE_PROVIDER_SITE_OTHER): Payer: Self-pay | Admitting: Specialist

## 2016-05-24 ENCOUNTER — Encounter (INDEPENDENT_AMBULATORY_CARE_PROVIDER_SITE_OTHER): Payer: Self-pay | Admitting: Specialist

## 2016-05-24 VITALS — BP 122/81 | HR 85 | Ht 64.0 in | Wt 209.0 lb

## 2016-05-24 DIAGNOSIS — Z9889 Other specified postprocedural states: Secondary | ICD-10-CM

## 2016-05-24 NOTE — Progress Notes (Signed)
Office Visit Note   Patient: [name]           Date of Birth: [birth year]           MRN: [id number] Visit Date: 05/24/2016              Requested by: Gregor Hams, MD [place] [address] [place], [place] [id number] PCP: Lynne Leader, MD   Assessment & Plan: Visit Diagnoses:  1. S/P carpal tunnel release     Plan: Patient will continue to gradually resume activities as tolerated. I think she is doing well at this point. Follow-up the office as needed.  Follow-Up Instructions: Return if symptoms worsen or fail to improve.   Orders:  No orders of the defined types were placed in this encounter.  No orders of the defined types were placed in this encounter.     Procedures: No procedures performed   Clinical Data: No additional findings.   Subjective: Chief Complaint  Patient presents with  . Right Hand - Routine Post Op    7 weeks post op--Right Carpal Tunnel Release---04/05/2016    HPI Patient returns. Status post right carpal tunnel release. States that she is doing very well and pleased with her surgical result. Preop hand pain numbness and tingling is gone. Review of Systems No complaints of cardiac pulmonary GI GU issues.  Objective: Vital Signs: BP 122/81 (BP Location: Left Arm, Patient Position: Sitting)   Pulse 85   Ht 5' 4"  (1.626 m)   Wt 209 lb (94.8 kg)   BMI 35.87 kg/m   Physical Exam  Constitutional: She is oriented to person, place, and time. She appears well-developed. No distress.  HENT:  Head: Normocephalic and atraumatic.  Eyes: EOM are normal. Pupils are equal, round, and reactive to light.  Neck: Normal range of motion.  Pulmonary/Chest: No respiratory distress.  Abdominal: She exhibits no distension.  Musculoskeletal:  Wrist good range motion. Good grip strength. Surgical incision is well-healed.  Neurological: She is alert and oriented to person, place, and time.  Skin: Skin is warm and dry.  Psychiatric: She has a normal  mood and affect.    Ortho Exam  Specialty Comments:  No specialty comments available.  Imaging: No results found.   PMFS History: Patient Active Problem List   Diagnosis Date Noted  . Carpal tunnel syndrome, right upper limb 04/05/2016    Class: Chronic  . Plantar fasciitis, left 11/17/2015  . Fatigue 11/17/2015  . Sinusitis 03/10/2015  . Type 2 diabetes mellitus with vascular disease ([place]) 01/14/2015  . Vitamin D deficiency 01/14/2015  . Left ankle pain 01/13/2015  . Obesity 01/13/2015  . Glucose intolerance (impaired glucose tolerance) 03/29/2013  . Fatty liver disease, nonalcoholic 68/37/2902  . Peripheral vascular disease ([place]) 07/15/2011  . DEPRESSION 08/13/2008  . PERIMENOPAUSAL SYNDROME 05/08/2008  . ALLERGIC RHINITIS 04/10/2008  . Hypothyroidism 08/04/2006  . Dyslipidemia 08/04/2006  . Essential hypertension 08/04/2006   Past Medical History:  Diagnosis Date  . ALLERGIC RHINITIS 04/10/2008  . Carpal tunnel syndrome of right wrist   . DEPRESSION 08/13/2008  . Diabetes mellitus without complication ([place])   . HYPERLIPIDEMIA 08/04/2006  . HYPERTENSION 08/04/2006  . HYPOTHYROIDISM 08/04/2006  . LOW BACK PAIN 08/04/2006  . Neuromuscular disorder ([place])   . PERIMENOPAUSAL SYNDROME 05/08/2008  . PVD (peripheral vascular disease) ([place])    occlusive, status post bifem bypass 2007    Family History  Problem Relation Age of Onset  . Hypertension Mother   .  Heart disease Father   . Hyperlipidemia Father   . Hypertension Father   . Alcohol abuse Brother   . Diabetes Maternal Grandmother   . Alcohol abuse Maternal Uncle   . Stroke Maternal Uncle     Past Surgical History:  Procedure Laterality Date  . CARPAL TUNNEL RELEASE Right 04/05/2016   Procedure: OPEN RIGHT CARPAL TUNNEL RELEASE;  Surgeon: Jessy Oto, MD;  Location: [facility];  Service: Orthopedics;  Laterality: Right;  . CHOLECYSTECTOMY    . FEMORAL BYPASS     bifem.  Marland Kitchen HAGLAND'S DEFORMITY  EXCISION    . TUBAL LIGATION     Social History   Occupational History  . Not on file.   Social History Main Topics  . Smoking status: Former Smoker    Quit date: 02/15/2004  . Smokeless tobacco: Never Used  . Alcohol use No  . Drug use: No  . Sexual activity: Yes    Birth control/ protection: Post-menopausal

## 2016-05-25 ENCOUNTER — Encounter: Payer: Self-pay | Admitting: Family Medicine

## 2016-05-26 NOTE — Telephone Encounter (Signed)
Notified patient and she will have it resent.

## 2016-05-26 NOTE — Telephone Encounter (Signed)
I dont see it. Please have them re-send it

## 2016-05-28 ENCOUNTER — Encounter: Payer: Self-pay | Admitting: Emergency Medicine

## 2016-05-28 ENCOUNTER — Emergency Department
Admission: EM | Admit: 2016-05-28 | Discharge: 2016-05-28 | Disposition: A | Discharge status: Home / Self Care | Payer: PRIVATE HEALTH INSURANCE | Source: Home / Self Care | Attending: Family Medicine | Admitting: Family Medicine

## 2016-05-28 DIAGNOSIS — J9801 Acute bronchospasm: Secondary | ICD-10-CM

## 2016-05-28 DIAGNOSIS — B9789 Other viral agents as the cause of diseases classified elsewhere: Secondary | ICD-10-CM

## 2016-05-28 DIAGNOSIS — J069 Acute upper respiratory infection, unspecified: Secondary | ICD-10-CM

## 2016-05-28 MED ORDER — AMOXICILLIN-POT CLAVULANATE 875-125 MG PO TABS: 1.0000 | ORAL_TABLET | Freq: Two times a day (BID) | ORAL | 0 refills | Status: DC

## 2016-05-28 MED ORDER — GUAIFENESIN-CODEINE 100-10 MG/5ML PO SOLN: ORAL | 0 refills | Status: DC

## 2016-05-28 MED ORDER — PREDNISONE 20 MG PO TABS: ORAL_TABLET | ORAL | 0 refills | Status: DC

## 2016-05-28 NOTE — Discharge Instructions (Signed)
Take plain guaifenesin (1226m extended release tabs such as Mucinex) twice daily, with plenty of water, for cough and congestion.  Get adequate rest.   May use Afrin nasal spray (or generic oxymetazoline) each morning for about 5 days and then discontinue.  Also recommend using saline nasal spray several times daily and saline nasal irrigation (AYR is a common brand).  Use Atrovent nasal spray each morning after using Afrin nasal spray and saline nasal irrigation. Try warm salt water gargles for sore throat.  Stop all antihistamines for now, and other non-prescription cough/cold preparations. Begin Augmentin if not improving about one week or if persistent fever develops   Follow-up with family doctor if not improving about10 days.

## 2016-05-28 NOTE — ED Provider Notes (Signed)
[facility]    CSN: [id number] Arrival date & time: 05/28/16  1343     History   Chief Complaint Chief Complaint  Patient presents with  . Cough    HPI [name] is a 56 y.o. female.   About 8 days ago patient developed typical cold-like symptoms developing over several days, including mild sore throat, sinus congestion, headache, fatigue, and cough.   During the past 5 days she has had chills and fever to 100+ and has developed wheezing.  She has a history of seasonal rhinitis.  Her daughter has asthma.  She notes that her URI's are often prolonged.   The history is provided by the patient.    Past Medical History:  Diagnosis Date  . ALLERGIC RHINITIS 04/10/2008  . Carpal tunnel syndrome of right wrist   . DEPRESSION 08/13/2008  . Diabetes mellitus without complication ([place])   . HYPERLIPIDEMIA 08/04/2006  . HYPERTENSION 08/04/2006  . HYPOTHYROIDISM 08/04/2006  . LOW BACK PAIN 08/04/2006  . Neuromuscular disorder ([place])   . PERIMENOPAUSAL SYNDROME 05/08/2008  . PVD (peripheral vascular disease) ([place])    occlusive, status post bifem bypass 2007    Patient Active Problem List   Diagnosis Date Noted  . Carpal tunnel syndrome, right upper limb 04/05/2016    Class: Chronic  . Plantar fasciitis, left 11/17/2015  . Fatigue 11/17/2015  . Sinusitis 03/10/2015  . Type 2 diabetes mellitus with vascular disease ([place]) 01/14/2015  . Vitamin D deficiency 01/14/2015  . Left ankle pain 01/13/2015  . Obesity 01/13/2015  . Glucose intolerance (impaired glucose tolerance) 03/29/2013  . Fatty liver disease, nonalcoholic 63/87/5643  . Peripheral vascular disease ([place]) 07/15/2011  . DEPRESSION 08/13/2008  . PERIMENOPAUSAL SYNDROME 05/08/2008  . ALLERGIC RHINITIS 04/10/2008  . Hypothyroidism 08/04/2006  . Dyslipidemia 08/04/2006  . Essential hypertension 08/04/2006    Past Surgical History:  Procedure Laterality Date  . CARPAL TUNNEL RELEASE Right 04/05/2016   Procedure: OPEN RIGHT CARPAL TUNNEL RELEASE;  Surgeon: Jessy Oto, MD;  Location: [facility];  Service: Orthopedics;  Laterality: Right;  . CHOLECYSTECTOMY    . FEMORAL BYPASS     bifem.  Marland Kitchen HAGLAND'S DEFORMITY EXCISION    . TUBAL LIGATION      OB History    No data available       Home Medications    Prior to Admission medications   Medication Sig Start Date End Date Taking? Authorizing Provider  amoxicillin-clavulanate (AUGMENTIN) 875-125 MG tablet Take 1 tablet by mouth 2 (two) times daily. Take with food (Rx void after 06/05/16) 05/28/16   Kandra Nicolas, MD  benazepril (LOTENSIN) 10 MG tablet Take 1 tablet (10 mg total) by mouth daily. 11/17/15   Gregor Hams, MD  diclofenac sodium (VOLTAREN) 1 % GEL Apply 2 g topically 4 (four) times daily. To affected joint. 01/13/15   Gregor Hams, MD  fexofenadine (ALLEGRA) 180 MG tablet Take 1 tablet (180 mg total) by mouth daily. 01/13/15   Gregor Hams, MD  guaiFENesin-codeine 100-10 MG/5ML syrup Take 44m by mouth at bedtime as needed for cough 05/28/16   SKandra Nicolas MD  HYDROcodone-acetaminophen (NORCO/VICODIN) 5-325 MG tablet Take 1-2 tablets by mouth every 4 (four) hours as needed for moderate pain. 04/05/16   JJessy Oto MD  ipratropium (ATROVENT) 0.03 % nasal spray Place 2 sprays into both nostrils 3 (three) times daily as needed for rhinitis. 11/26/15   NEmeterio Reeve DO  levothyroxine (SYNTHROID,  LEVOTHROID) 75 MCG tablet Take 1 tablet (75 mcg total) by mouth daily before breakfast. NEED LAB WORK FOR MORE REFILLS 03/17/16   Gregor Hams, MD  metoprolol (LOPRESSOR) 50 MG tablet Take 1 tablet (50 mg total) by mouth 2 (two) times daily. 11/17/15   Gregor Hams, MD  pravastatin (PRAVACHOL) 80 MG tablet Take 1 tablet (80 mg total) by mouth daily. 11/19/15   Gregor Hams, MD  predniSONE (DELTASONE) 20 MG tablet Take one tab by mouth twice daily for 5 days, then one daily. Take with food. 05/28/16   Kandra Nicolas, MD    venlafaxine XR (EFFEXOR-XR) 75 MG 24 hr capsule Take 1 capsule (75 mg total) by mouth daily with breakfast. Patient needs to schedule a follow up appointment before more refills. 11/17/15   Gregor Hams, MD    Family History Family History  Problem Relation Age of Onset  . Hypertension Mother   . Heart disease Father   . Hyperlipidemia Father   . Hypertension Father   . Alcohol abuse Brother   . Diabetes Maternal Grandmother   . Alcohol abuse Maternal Uncle   . Stroke Maternal Uncle     Social History Social History  Substance Use Topics  . Smoking status: Former Smoker    Quit date: 02/15/2004  . Smokeless tobacco: Never Used  . Alcohol use No     Allergies   Gluten meal; Influenza virus vacc split pf; and Lipitor [atorvastatin]   Review of Systems Review of Systems + sore throat + cough + hoarse No pleuritic pain + wheezing + nasal congestion + post-nasal drainage No sinus pain/pressure No itchy/red eyes No earache No hemoptysis No SOB + fever, + chills No nausea No vomiting No abdominal pain No diarrhea No urinary symptoms No skin rash + fatigue No myalgias + headache Used OTC meds without relief   Physical Exam Triage Vital Signs ED Triage Vitals [05/28/16 1423]  Enc Vitals Group     BP (!) 156/95     Pulse Rate 77     Resp      Temp 98.7 F (37.1 C)     Temp Source Oral     SpO2 97 %     Weight 212 lb 12 oz (96.5 kg)     Height 5' 4"  (1.626 m)     Head Circumference      Peak Flow      Pain Score 0     Pain Loc      Pain Edu?      Excl. in [place]?    No data found.   Updated Vital Signs BP (!) 156/95 (BP Location: Left Arm)   Pulse 77   Temp 98.7 F (37.1 C) (Oral)   Ht 5' 4"  (1.626 m)   Wt 212 lb 12 oz (96.5 kg)   SpO2 97%   BMI 36.52 kg/m   Visual Acuity Right Eye Distance:   Left Eye Distance:   Bilateral Distance:    Right Eye Near:   Left Eye Near:    Bilateral Near:     Physical Exam Nursing notes and Vital  Signs reviewed. Appearance:  Patient appears stated age, and in no acute distress Eyes:  Pupils are equal, round, and reactive to light and accomodation.  Extraocular movement is intact.  Conjunctivae are not inflamed  Ears:  Canals normal.  Tympanic membranes normal.  Nose:  Mildly congested turbinates.  No sinus tenderness.    Pharynx:  Normal Neck:  Supple.  Tender enlarged posterior/lateral nodes are palpated bilaterally  Lungs:   Faint bilateral expiratory wheezes anteriorly. Breath sounds are equal.  Moving air well. Heart:  Regular rate and rhythm without murmurs, rubs, or gallops.  Abdomen:  Nontender without masses or hepatosplenomegaly.  Bowel sounds are present.  No CVA or flank tenderness.  Extremities:  No edema.  Skin:  No rash present.    UC Treatments / Results  Labs (all labs ordered are listed, but only abnormal results are displayed) Labs Reviewed - No data to display  EKG  EKG Interpretation None       Radiology No results found.  Procedures Procedures (including critical care time)  Medications Ordered in UC Medications - No data to display   Initial Impression / Assessment and Plan / UC Course  I have reviewed the triage vital signs and the nursing notes.  Pertinent labs & imaging results that were available during my care of the patient were reviewed by me and considered in my medical decision making (see chart for details).    There is no evidence of bacterial infection today.   With patient's history of seasonal rhinitis, family history of asthma, and past history of viral URI's that linger, she probably has a predisposition to mild reactive airways disease.  Begin prednisone burst/taper.  Rx for Robitussin AC for night time cough.  Take plain guaifenesin (1256m extended release tabs such as Mucinex) twice daily, with plenty of water, for cough and congestion.  Get adequate rest.   May use Afrin nasal spray (or generic oxymetazoline) each morning  for about 5 days and then discontinue.  Also recommend using saline nasal spray several times daily and saline nasal irrigation (AYR is a common brand).  Use Atrovent nasal spray each morning after using Afrin nasal spray and saline nasal irrigation. Try warm salt water gargles for sore throat.  Stop all antihistamines for now, and other non-prescription cough/cold preparations. Begin Augmentin if not improving about one week or if persistent fever develops (Given a prescription to hold, with an expiration date)  Follow-up with family doctor if not improving about10 days.    Final Clinical Impressions(s) / UC Diagnoses   Final diagnoses:  Viral URI with cough  Bronchospasm    New Prescriptions New Prescriptions   AMOXICILLIN-CLAVULANATE (AUGMENTIN) 875-125 MG TABLET    Take 1 tablet by mouth 2 (two) times daily. Take with food (Rx void after 06/05/16)   GUAIFENESIN-CODEINE 100-10 MG/5ML SYRUP    Take 159mby mouth at bedtime as needed for cough   PREDNISONE (DELTASONE) 20 MG TABLET    Take one tab by mouth twice daily for 5 days, then one daily. Take with food.     StKandra NicolasMD 05/31/16 2206

## 2016-05-28 NOTE — ED Triage Notes (Signed)
Pt c/o ha, sinus pressure last week, drainage into throat, non productive cough on Monday, been coughing since Monday, body aches/body chills, possible allergies?

## 2016-06-06 ENCOUNTER — Encounter: Payer: Self-pay | Admitting: Family Medicine

## 2016-06-06 ENCOUNTER — Ambulatory Visit (INDEPENDENT_AMBULATORY_CARE_PROVIDER_SITE_OTHER): Payer: PRIVATE HEALTH INSURANCE | Admitting: Family Medicine

## 2016-06-06 VITALS — BP 125/66 | HR 61 | Ht 64.0 in | Wt 214.0 lb

## 2016-06-06 DIAGNOSIS — J0100 Acute maxillary sinusitis, unspecified: Secondary | ICD-10-CM | POA: Diagnosis not present

## 2016-06-06 DIAGNOSIS — R05 Cough: Secondary | ICD-10-CM

## 2016-06-06 DIAGNOSIS — R059 Cough, unspecified: Secondary | ICD-10-CM

## 2016-06-06 MED ORDER — AZITHROMYCIN 250 MG PO TABS: ORAL_TABLET | ORAL | 0 refills | Status: AC

## 2016-06-06 MED ORDER — ALBUTEROL SULFATE (2.5 MG/3ML) 0.083% IN NEBU
2.5000 mg | INHALATION_SOLUTION | Freq: Once | RESPIRATORY_TRACT | Status: AC
  Administered 2016-06-06: 2.5 mg via RESPIRATORY_TRACT

## 2016-06-06 NOTE — Progress Notes (Signed)
   Subjective:    Patient ID: [name], female    DOB: 1960-04-16, 56 y.o.   MRN: [id number]  HPI 56 yo female with no prior hx of pulm dz comes in today bc she still has some chest congestion. She was actually seen at urgent care on October 14 a little over a week ago. She had cold-like symptoms at that time being headache, sinus congestion, fatigue, cough. She was placed on Augmentin given some cough syrup and prednisone. She still feels like her some congestion in the upper part of her chest and she still having some right sided facial pain. He said she's had quite a severe headache around her right eye for about the last 3 days. No fevers or chills.Cough is improved.     Review of Systems     Objective:   Physical Exam  Constitutional: She is oriented to person, place, and time. She appears well-developed and well-nourished.  HENT:  Head: Normocephalic and atraumatic.  Right Ear: External ear normal.  Left Ear: External ear normal.  Nose: Nose normal.  Mouth/Throat: Oropharynx is clear and moist.  TMs and canals are clear.   Eyes: Conjunctivae and EOM are normal. Pupils are equal, round, and reactive to light.  Neck: Neck supple. No thyromegaly present.  Cardiovascular: Normal rate, regular rhythm and normal heart sounds.   Pulmonary/Chest: Effort normal and breath sounds normal. She has no wheezes.  Lymphadenopathy:    She has no cervical adenopathy.  Neurological: She is alert and oriented to person, place, and time.  Skin: Skin is warm and dry.  Psychiatric: She has a normal mood and affect.          Assessment & Plan:  Acute sinusitis-I really think she still has an unresolved sinus infection causing the right-sided head pain and also causing feeling of congestion in her chest even though her cough is overall better. At this point a medical head and add azithromycin to her regimen and go ahead and complete the Augmentin as well. If she's not significantly better after  5-7 days and please let me know and we'll consider chest x-ray at that time. We did go ahead and do a peak flow given albuterol treatment here in the office just to see if she felt like there was any relief. She read that she actually did feel better after the treatment.

## 2016-06-07 MED ORDER — ALBUTEROL SULFATE HFA 108 (90 BASE) MCG/ACT IN AERS: 2.0000 | INHALATION_SPRAY | Freq: Four times a day (QID) | RESPIRATORY_TRACT | 2 refills | Status: DC | PRN

## 2016-06-16 ENCOUNTER — Telehealth: Payer: Self-pay | Admitting: *Deleted

## 2016-06-16 NOTE — Telephone Encounter (Signed)
Called [name] back and informed her that Dr. Madilyn Fireman did order this for her and this is to be used if she has any wheezing or SOB. I told her that Dr. Madilyn Fireman had noticed that she did have some improvement after the neb that was giving in the office and this was sent after she left the office. I apologized for the misunderstanding. [name] understood and will go p/u .Audelia Hives [employer]

## 2016-06-16 NOTE — Telephone Encounter (Signed)
Pt called and lvm stating that she went to her pharmacy and they had an inhaler there for her. She said that she did not pick this up because she wasn't sure if this was something that she was actually supposed to be taking and did not see this information on her AVS or in her mychart and wasn't told that this would be prescribed to her. She asked that someone call her back about this.[name] [place]

## 2016-07-06 ENCOUNTER — Other Ambulatory Visit: Payer: Self-pay | Admitting: Family Medicine

## 2016-07-12 ENCOUNTER — Ambulatory Visit (INDEPENDENT_AMBULATORY_CARE_PROVIDER_SITE_OTHER): Payer: PRIVATE HEALTH INSURANCE

## 2016-07-12 ENCOUNTER — Ambulatory Visit (INDEPENDENT_AMBULATORY_CARE_PROVIDER_SITE_OTHER): Payer: PRIVATE HEALTH INSURANCE | Admitting: Family Medicine

## 2016-07-12 ENCOUNTER — Encounter: Payer: Self-pay | Admitting: Family Medicine

## 2016-07-12 VITALS — BP 128/82 | HR 70 | Temp 98.3°F | Wt 218.0 lb

## 2016-07-12 DIAGNOSIS — I739 Peripheral vascular disease, unspecified: Secondary | ICD-10-CM

## 2016-07-12 DIAGNOSIS — K760 Fatty (change of) liver, not elsewhere classified: Secondary | ICD-10-CM | POA: Diagnosis not present

## 2016-07-12 DIAGNOSIS — E1159 Type 2 diabetes mellitus with other circulatory complications: Secondary | ICD-10-CM

## 2016-07-12 DIAGNOSIS — E559 Vitamin D deficiency, unspecified: Secondary | ICD-10-CM

## 2016-07-12 DIAGNOSIS — E785 Hyperlipidemia, unspecified: Secondary | ICD-10-CM

## 2016-07-12 DIAGNOSIS — E890 Postprocedural hypothyroidism: Secondary | ICD-10-CM | POA: Diagnosis not present

## 2016-07-12 DIAGNOSIS — R05 Cough: Secondary | ICD-10-CM

## 2016-07-12 DIAGNOSIS — R0602 Shortness of breath: Secondary | ICD-10-CM

## 2016-07-12 HISTORY — DX: Shortness of breath: R06.02

## 2016-07-12 NOTE — Progress Notes (Signed)
[name] is a 56 y.o. female who presents to [facility]: [place] today for continued shortness of breath.   [place]y was seen about a month ago for a presumed URI developing into sinusitis. She had wheezing during the illness and was presumed to have potential chronic bronchitis. She notes as a child she had chronic bronchitis or asthma. She was given a breathing treatment at some point during her treatment which did help temporarily. She was prescribed albuterol inhaler which she's been using intermittently with only mild success. She notes no further coughing or nasal congestion but does note continued mild shortness of breath. She denies chest pain palpitations or leg swelling.  She has a history of prediabetes hypothyroidism and hypertension as well as hyperlipidemia. She takes the medications listed below. She feels reasonably well with these medical regimens.  Obesity: Patient has a history of obesity. She has had some success with dietary control especially with a limited carb diet. She was recently she's been less adherent to her diet and has regained weight. She does not exercise regularly. She wishes to lose weight. She had good success well seen a nutritionist to assess since left the area. She is interested in medical supervised weight management.   Past Medical History:  Diagnosis Date  . ALLERGIC RHINITIS 04/10/2008  . Carpal tunnel syndrome of right wrist   . DEPRESSION 08/13/2008  . Diabetes mellitus without complication ([place])   . HYPERLIPIDEMIA 08/04/2006  . HYPERTENSION 08/04/2006  . HYPOTHYROIDISM 08/04/2006  . LOW BACK PAIN 08/04/2006  . Neuromuscular disorder ([place])   . PERIMENOPAUSAL SYNDROME 05/08/2008  . PVD (peripheral vascular disease) ([place])    occlusive, status post bifem bypass 2007   Past Surgical History:  Procedure Laterality Date  . CARPAL TUNNEL  RELEASE Right 04/05/2016   Procedure: OPEN RIGHT CARPAL TUNNEL RELEASE;  Surgeon: Jessy Oto, MD;  Location: [facility];  Service: Orthopedics;  Laterality: Right;  . CHOLECYSTECTOMY    . FEMORAL BYPASS     bifem.  Marland Kitchen HAGLAND'S DEFORMITY EXCISION    . TUBAL LIGATION     Social History  Substance Use Topics  . Smoking status: Former Smoker    Quit date: 02/15/2004  . Smokeless tobacco: Never Used  . Alcohol use No   family history includes Alcohol abuse in her brother and maternal uncle; Diabetes in her maternal grandmother; Heart disease in her father; Hyperlipidemia in her father; Hypertension in her father and mother; Stroke in her maternal uncle.  ROS as above:  Medications: Current Outpatient Prescriptions  Medication Sig Dispense Refill  . albuterol (PROVENTIL HFA;VENTOLIN HFA) 108 (90 Base) MCG/ACT inhaler Inhale 2 puffs into the lungs every 6 (six) hours as needed for wheezing or shortness of breath. 1 Inhaler 2  . benazepril (LOTENSIN) 10 MG tablet Take 1 tablet (10 mg total) by mouth daily. 90 tablet 3  . diclofenac sodium (VOLTAREN) 1 % GEL Apply 2 g topically 4 (four) times daily. To affected joint. 100 g 11  . fexofenadine (ALLEGRA) 180 MG tablet Take 1 tablet (180 mg total) by mouth daily. 30 tablet 12  . ipratropium (ATROVENT) 0.03 % nasal spray Place 2 sprays into both nostrils 3 (three) times daily as needed for rhinitis. 30 mL 0  . levothyroxine (SYNTHROID, LEVOTHROID) 75 MCG tablet Take 1 tablet (75 mcg total) by mouth daily before breakfast. Appt needed for refills. 30 tablet 0  . metoprolol (LOPRESSOR)  50 MG tablet Take 1 tablet (50 mg total) by mouth 2 (two) times daily. 180 tablet 3  . pravastatin (PRAVACHOL) 80 MG tablet Take 1 tablet (80 mg total) by mouth daily. 90 tablet 3  . venlafaxine XR (EFFEXOR-XR) 75 MG 24 hr capsule Take 1 capsule (75 mg total) by mouth daily with breakfast. Patient needs to schedule a follow up appointment before more  refills. 90 capsule 3   No current facility-administered medications for this visit.    Allergies  Allergen Reactions  . Gluten Meal Diarrhea  . Influenza Virus Vacc Split Pf Other (See Comments)    Swelling around injection site  . Lipitor [Atorvastatin] Other (See Comments)    Dizziness.     Health Maintenance Health Maintenance  Topic Date Due  . OPHTHALMOLOGY EXAM  04/08/1970  . COLONOSCOPY  04/08/2010  . PAP SMEAR  01/17/2016  . FOOT EXAM  02/11/2016  . HEMOGLOBIN A1C  05/17/2016  . MAMMOGRAM  02/03/2017  . TETANUS/TDAP  08/14/2018  . PNEUMOCOCCAL POLYSACCHARIDE VACCINE (2) 02/11/2020  . Hepatitis C Screening  Completed  . HIV Screening  Completed     Exam:  BP 128/82   Pulse 70   Temp 98.3 F (36.8 C) (Oral)   Wt 218 lb (98.9 kg)   SpO2 97%   BMI 37.42 kg/m   Wt Readings from Last 10 Encounters:  07/12/16 218 lb (98.9 kg)  06/06/16 214 lb (97.1 kg)  05/28/16 212 lb 12 oz (96.5 kg)  05/24/16 209 lb (94.8 kg)  04/19/16 202 lb (91.6 kg)  04/05/16 213 lb 2 oz (96.7 kg)  03/24/16 202 lb (91.6 kg)  12/14/15 202 lb (91.6 kg)  11/26/15 200 lb (90.7 kg)  11/17/15 198 lb (89.8 kg)    Gen: Well NAD HEENT: EOMI,  MMM Normal nasal turbinates. Posterior pharynx Lungs: Normal work of breathing. CTABL Heart: RRR no MRG Abd: NABS, Soft. Nondistended, Nontender Exts: Brisk capillary refill, warm and well perfused.    No results found for this or any previous visit (from the past 72 hour(s)). Dg Chest 2 View  Result Date: 07/12/2016 CLINICAL DATA:  Initial evaluation for acute cough. EXAM: CHEST  2 VIEW COMPARISON:  Prior radiograph from 03/05/2005. FINDINGS: Cardiac and mediastinal silhouettes are stable in size and contour, and remain within normal limits. Lungs normally inflated. Mild diffuse peribronchial thickening, which may reflect T bronchiolitis given the history of cough. No consolidative opacity to suggest alveolar pneumonia. No pulmonary edema or pleural  effusion. No pneumothorax. No acute osseus abnormality. IMPRESSION: Mild scattered peribronchial thickening, suggestive of possible bronchiolitis given the history of cough. No focal infiltrates to suggest alveolar pneumonia identified. Electronically Signed   By: Jeannine Boga M.D.   On: 07/12/2016 23:32      Assessment and Plan: 56 y.o. female with  Shortness of breath: I suspect patient has COPD given her history of asthma as a child and history of smoking. I taught her how to use her albuterol inhaler more effectively and will be obtaining spirometry in the near future. I suspect she'll probably benefit from LAMA/LABA/ICS treatment.  Prediabetes: We'll check A1c. Continue to follow.  Hypothyroidism: TSH pending.  Hypertension: Goal metabolic panel pending.  Obesity: Work on reduce carb diet and calorie counting. Try to exercise 15-30 minutes most of the days per week. Recheck in one month.  Orders Placed This Encounter  Procedures  . DG Chest 2 View    Order Specific Question:   Reason for exam:  Answer:   Cough, assess intra-thoracic pathology    Order Specific Question:   Is the patient pregnant?    Answer:   No    Order Specific Question:   Preferred imaging location?    Answer:   [facility]  . CBC  . COMPLETE METABOLIC PANEL WITH GFR  . Hemoglobin A1c  . TSH  . VITAMIN D 25 Hydroxy (Vit-D Deficiency, Fractures)   No orders of the defined types were placed in this encounter.    Discussed warning signs or symptoms. Please see discharge instructions. Patient expresses understanding.  I spent 40 minutes with this patient, greater than 50% was face-to-face time counseling regarding the above diagnosis.

## 2016-07-12 NOTE — Patient Instructions (Addendum)
Thank you for coming in today. Get xray today. Get fasting labs soon.  Schedule for spirometry soon.  Recheck weight in 1 month.   Work on less carbs.  Keep a food log and bring it with you to the next visit.    Chronic Bronchitis Chronic bronchitis is a lasting inflammation of the bronchial tubes, which are the tubes that carry air into your lungs. This is inflammation that occurs:  On most days of the week.  For at least three months at a time.  Over a period of two years in a row. When the bronchial tubes are inflamed, they start to produce mucus. The inflammation and buildup of mucus make it more difficult to breathe. Chronic bronchitis is usually a permanent problem and is one type of chronic obstructive pulmonary disease (COPD). People with chronic bronchitis are at greater risk for getting repeated colds, or respiratory infections. What are the causes? Chronic bronchitis most often occurs in people who have:  Long-standing, severe asthma.  A history of smoking.  Asthma and who also smoke. What are the signs or symptoms? Chronic bronchitis may cause the following:  A cough that brings up mucus (productive cough).  Shortness of breath.  Early morning headache.  Wheezing.  Chest discomfort.  Recurring respiratory infections. How is this diagnosed? Your health care provider may confirm the diagnosis by:  Taking your medical history.  Performing a physical exam.  Taking a chest X-ray.  Performing pulmonary function tests. How is this treated? Treatment involves controlling symptoms with medicines, oxygen therapy, or making lifestyle changes, such as exercising and eating a healthy, well-balanced diet. Medicines could include:  Inhalers to improve air flow in and out of your lungs.  Antibiotics to treat bacterial infections, such as pneumonia, sinus infections, and acute bronchitis. As a preventative measure, your health care provider may recommend routine  vaccinations for influenza and pneumonia. This is to prevent infection and hospitalization since you may be more at risk for these types of infections. Follow these instructions at home:  Take medicines only as directed by your health care provider.  If you smoke cigarettes, chew tobacco, or use electronic cigarettes, quit. If you need help quitting, ask your health care provider.  Avoid pollen, dust, animal dander, molds, smoke, and other things that cause shortness of breath or wheezing attacks.  Talk to your health care provider about possible exercise routines. Regular exercise is very important to help you feel better.  If you are prescribed oxygen use at home follow these guidelines:  Never smoke while using oxygen. Oxygen does not burn or explode, but flammable materials will burn faster in the presence of oxygen.  Keep a Data processing manager close by. Let your fire department know that you have oxygen in your home.  Warn visitors not to smoke near you when you are using oxygen. Put up "no smoking" signs in your home where you most often use the oxygen.  Regularly test your smoke detectors at home to make sure they work. If you receive care in your home from a nurse or other health care provider, he or she may also check to make sure your smoke detectors work.  Ask your health care provider whether you would benefit from a pulmonary rehabilitation program.  Do not wait to get medical care if you have any concerning symptoms. Delays could cause permanent injury and may be life threatening. Contact a health care provider if:  You have increased coughing or shortness of breath or  both.  You have muscle aches.  You have chest pain.  Your mucus gets thicker.  Your mucus changes from clear or white to yellow, green, gray, or bloody. Get help right away if:  Your usual medicines do not stop your wheezing.  You have increased difficulty breathing.  You have any problems with the  medicine you are taking, such as a rash, itching, swelling, or trouble breathing. This information is not intended to replace advice given to you by your health care provider. Make sure you discuss any questions you have with your health care provider. Document Released: 11/18/2005 Document Revised: 06/11/2015 Document Reviewed: 03/11/2013 Elsevier Interactive Patient Education  2017 [employer].

## 2016-07-15 NOTE — Addendum Note (Signed)
Addendum  created 07/15/16 1030 by Effie Berkshire, MD   Sign clinical note

## 2016-07-25 LAB — CBC
HEMATOCRIT: 41.1 % (ref 35.0–45.0)
HEMOGLOBIN: 13.8 g/dL (ref 11.7–15.5)
MCH: 30.8 pg (ref 27.0–33.0)
MCHC: 33.6 g/dL (ref 32.0–36.0)
MCV: 91.7 fL (ref 80.0–100.0)
MPV: 9.7 fL (ref 7.5–12.5)
Platelets: 189 10*3/uL (ref 140–400)
RBC: 4.48 MIL/uL (ref 3.80–5.10)
RDW: 13.7 % (ref 11.0–15.0)
WBC: 6.6 10*3/uL (ref 3.8–10.8)

## 2016-07-26 LAB — COMPLETE METABOLIC PANEL WITH GFR
ALBUMIN: 4.1 g/dL (ref 3.6–5.1)
ALK PHOS: 106 U/L (ref 33–130)
ALT: 26 U/L (ref 6–29)
AST: 21 U/L (ref 10–35)
BUN: 18 mg/dL (ref 7–25)
CALCIUM: 9.2 mg/dL (ref 8.6–10.4)
CO2: 20 mmol/L (ref 20–31)
CREATININE: 1.04 mg/dL (ref 0.50–1.05)
Chloride: 106 mmol/L (ref 98–110)
GFR, Est African American: 69 mL/min (ref >=60)
GFR, Est Non African American: 60 mL/min (ref >=60)
Glucose, Bld: 141 mg/dL — ABNORMAL HIGH (ref 65–99)
POTASSIUM: 4.4 mmol/L (ref 3.5–5.3)
Sodium: 139 mmol/L (ref 135–146)
Total Bilirubin: 0.4 mg/dL (ref 0.2–1.2)
Total Protein: 7 g/dL (ref 6.1–8.1)

## 2016-07-26 LAB — HEMOGLOBIN A1C
HEMOGLOBIN A1C: 6.5 % — AB (ref <5.7)
Mean Plasma Glucose: 140 mg/dL

## 2016-07-26 LAB — TSH: TSH: 10.78 mIU/L — ABNORMAL HIGH

## 2016-07-26 LAB — VITAMIN D 25 HYDROXY (VIT D DEFICIENCY, FRACTURES): Vit D, 25-Hydroxy: 30 ng/mL (ref 30–100)

## 2016-07-26 MED ORDER — LEVOTHYROXINE SODIUM 88 MCG PO TABS: 88.0000 ug | ORAL_TABLET | Freq: Every day | ORAL | 0 refills | Status: DC

## 2016-07-26 NOTE — Addendum Note (Signed)
Addended by: Gregor Hams on: 07/26/2016 07:45 AM   Modules accepted: Orders

## 2016-08-09 ENCOUNTER — Encounter: Payer: Self-pay | Admitting: Family Medicine

## 2016-08-09 ENCOUNTER — Ambulatory Visit (INDEPENDENT_AMBULATORY_CARE_PROVIDER_SITE_OTHER): Payer: PRIVATE HEALTH INSURANCE | Admitting: Family Medicine

## 2016-08-09 VITALS — BP 129/64 | HR 77 | Wt 215.0 lb

## 2016-08-09 DIAGNOSIS — R0602 Shortness of breath: Secondary | ICD-10-CM | POA: Diagnosis not present

## 2016-08-09 DIAGNOSIS — Z6836 Body mass index (BMI) 36.0-36.9, adult: Secondary | ICD-10-CM

## 2016-08-09 DIAGNOSIS — E1159 Type 2 diabetes mellitus with other circulatory complications: Secondary | ICD-10-CM

## 2016-08-09 DIAGNOSIS — E890 Postprocedural hypothyroidism: Secondary | ICD-10-CM

## 2016-08-09 NOTE — Progress Notes (Signed)
[name] is a 56 y.o. female who presents to [facility]: [place] today for  Follow-up breathing obesity and diabetes.  Breathing: Patient has had multiple episodes of bronchitis. She is a former smoker having quit over 10 years ago. She notes she no longer has significant cough but continues to experience wheezing or shortness of breath. She has been using albuterol regularly which definitely helps. She denies any personal history of COPD.  Diabetes: Patient was recently diagnosed with diabetes with an A1c of 6.5. She is working on reduced carbohydrate diet and is losing weight. She feels well with no polyuria or polydipsia.  Thyroid: Patient was recently diagnosed with hypothyroid and her levothyroxine dose was recently increased. She feels well with no feeling too hot or too cold skin or hair changes.  Obesity: As noted above patient is eating a lower carbohydrate diet and has managed to lose a bit of weight.   Past Medical History:  Diagnosis Date  . ALLERGIC RHINITIS 04/10/2008  . Carpal tunnel syndrome of right wrist   . DEPRESSION 08/13/2008  . Diabetes mellitus without complication ([place])   . HYPERLIPIDEMIA 08/04/2006  . HYPERTENSION 08/04/2006  . HYPOTHYROIDISM 08/04/2006  . LOW BACK PAIN 08/04/2006  . Neuromuscular disorder ([place])   . PERIMENOPAUSAL SYNDROME 05/08/2008  . PVD (peripheral vascular disease) ([place])    occlusive, status post bifem bypass 2007   Past Surgical History:  Procedure Laterality Date  . CARPAL TUNNEL RELEASE Right 04/05/2016   Procedure: OPEN RIGHT CARPAL TUNNEL RELEASE;  Surgeon: Jessy Oto, MD;  Location: [facility];  Service: Orthopedics;  Laterality: Right;  . CHOLECYSTECTOMY    . FEMORAL BYPASS     bifem.  Marland Kitchen HAGLAND'S DEFORMITY EXCISION    . TUBAL LIGATION     Social History  Substance Use Topics  . Smoking status:  Former Smoker    Quit date: 02/15/2004  . Smokeless tobacco: Never Used  . Alcohol use No   family history includes Alcohol abuse in her brother and maternal uncle; Diabetes in her maternal grandmother; Heart disease in her father; Hyperlipidemia in her father; Hypertension in her father and mother; Stroke in her maternal uncle.  ROS as above:  Medications: Current Outpatient Prescriptions  Medication Sig Dispense Refill  . albuterol (PROVENTIL HFA;VENTOLIN HFA) 108 (90 Base) MCG/ACT inhaler Inhale 2 puffs into the lungs every 6 (six) hours as needed for wheezing or shortness of breath. 1 Inhaler 2  . benazepril (LOTENSIN) 10 MG tablet Take 1 tablet (10 mg total) by mouth daily. 90 tablet 3  . diclofenac sodium (VOLTAREN) 1 % GEL Apply 2 g topically 4 (four) times daily. To affected joint. 100 g 11  . fexofenadine (ALLEGRA) 180 MG tablet Take 1 tablet (180 mg total) by mouth daily. 30 tablet 12  . levothyroxine (SYNTHROID, LEVOTHROID) 88 MCG tablet Take 1 tablet (88 mcg total) by mouth daily before breakfast. Appt needed for refills. 90 tablet 0  . metoprolol (LOPRESSOR) 50 MG tablet Take 1 tablet (50 mg total) by mouth 2 (two) times daily. 180 tablet 3  . venlafaxine XR (EFFEXOR-XR) 75 MG 24 hr capsule Take 1 capsule (75 mg total) by mouth daily with breakfast. Patient needs to schedule a follow up appointment before more refills. 90 capsule 3   No current facility-administered medications for this visit.    Allergies  Allergen Reactions  . Gluten Meal Diarrhea  . Influenza Virus  Vacc Split Pf Other (See Comments)    Swelling around injection site  . Lipitor [Atorvastatin] Other (See Comments)    Dizziness.     Health Maintenance Health Maintenance  Topic Date Due  . PAP SMEAR  11/14/2016 (Originally 01/17/2016)  . OPHTHALMOLOGY EXAM  02/14/2017 (Originally 04/08/1970)  . COLONOSCOPY  02/14/2018 (Originally 04/08/2010)  . HEMOGLOBIN A1C  01/24/2017  . MAMMOGRAM  02/03/2017  . FOOT  EXAM  08/09/2017  . TETANUS/TDAP  08/14/2018  . PNEUMOCOCCAL POLYSACCHARIDE VACCINE (2) 02/11/2020  . Hepatitis C Screening  Completed  . HIV Screening  Completed     Exam:  BP 129/64   Pulse 77   Wt 215 lb (97.5 kg)   SpO2 99%   BMI 36.90 kg/m   Wt Readings from Last 10 Encounters:  08/09/16 215 lb (97.5 kg)  07/12/16 218 lb (98.9 kg)  06/06/16 214 lb (97.1 kg)  05/28/16 212 lb 12 oz (96.5 kg)  05/24/16 209 lb (94.8 kg)  04/19/16 202 lb (91.6 kg)  04/05/16 213 lb 2 oz (96.7 kg)  03/24/16 202 lb (91.6 kg)  12/14/15 202 lb (91.6 kg)  11/26/15 200 lb (90.7 kg)    Gen: Well NAD HEENT: EOMI,  MMM Lungs: Normal work of breathing. CTABL Heart: RRR no MRG Abd: NABS, Soft. Nondistended, Nontender Exts: Brisk capillary refill, warm and well perfused.  Diabetic Foot Exam - Simple   Simple Foot Form Diabetic Foot exam was performed with the following findings:  Yes 08/09/2016  4:36 PM  Visual Inspection No deformities, no ulcerations, no other skin breakdown bilaterally:  Yes Sensation Testing Intact to touch and monofilament testing bilaterally:  Yes Pulse Check Posterior Tibialis and Dorsalis pulse intact bilaterally:  Yes Comments       No results found for this or any previous visit (from the past 72 hour(s)). No results found.    Assessment and Plan: 56 y.o. female with  Breathing: Likely COPD. Spirometry ordered and pending in the near future.  Diabetes: Reasonably well at goal. Work on improved diet.  Hypothyroid: We'll recheck TSH near future. Continue levothyroxine  Obesity: Doing well. Work on reduced carbohydrate diet. Recheck in the near future.  Health status: Patient declined colon cancer screening and cervical cancer screening at this time.   No orders of the defined types were placed in this encounter.  No orders of the defined types were placed in this encounter.    Discussed warning signs or symptoms. Please see discharge instructions.  Patient expresses understanding.

## 2016-08-09 NOTE — Patient Instructions (Signed)
Thank you for coming in today. Please schedule for spirometry and pap smear.  Recheck sooner if needed.  Work on a reduced carb and calorie diet.   We will continue to follow.

## 2016-08-18 ENCOUNTER — Encounter: Payer: Self-pay | Admitting: Family Medicine

## 2016-08-18 ENCOUNTER — Ambulatory Visit (INDEPENDENT_AMBULATORY_CARE_PROVIDER_SITE_OTHER): Payer: PRIVATE HEALTH INSURANCE | Admitting: Family Medicine

## 2016-08-18 VITALS — BP 134/83 | HR 87 | Wt 213.0 lb

## 2016-08-18 DIAGNOSIS — R0602 Shortness of breath: Secondary | ICD-10-CM | POA: Diagnosis not present

## 2016-08-18 DIAGNOSIS — R059 Cough, unspecified: Secondary | ICD-10-CM

## 2016-08-18 DIAGNOSIS — R05 Cough: Secondary | ICD-10-CM | POA: Diagnosis not present

## 2016-08-18 MED ORDER — UMECLIDINIUM-VILANTEROL 62.5-25 MCG/INH IN AEPB: 1.0000 | INHALATION_SPRAY | Freq: Every day | RESPIRATORY_TRACT | 12 refills | Status: DC

## 2016-08-18 NOTE — Patient Instructions (Signed)
Thank you for coming in today. Add Anoro  Recheck in 1 month.   Umeclidinium; Vilanterol inhalation powder What is this medicine? UMECLIDINIUM; VILANTEROL (ue MEK li DIN ee um; vye LAN ter ol) inhalation is a combination of two medicines that decrease inflammation and help to open up the airways of your lungs. It is for chronic obstructive pulmonary disease (COPD), including chronic bronchitis or emphysema. Do NOT use for asthma or an acute asthma attack. Do NOT use for a COPD attack. This medicine may be used for other purposes; ask your health care provider or pharmacist if you have questions. COMMON BRAND NAME(S): ANORO ELLIPTA What should I tell my health care provider before I take this medicine? They need to know if you have any of these conditions: -bladder problems or difficulty passing urine -diabetes -glaucoma -heart disease or irregular heartbeat -high blood pressure -kidney disease -pheochromocytoma -prostate disease -seizures -thyroid disease -an unusual or allergic reaction to umeclidinium, vilanterol, lactose, milk proteins, other medicines, foods, dyes, or preservatives -pregnant or trying to get pregnant -breast-feeding How should I use this medicine? This medicine is inhaled through the mouth. It is used once per day. Follow the directions on the prescription label. Do not use a spacer device with this inhaler. Take your medicine at regular intervals. Do not take your medicine more often than directed. Do not stop taking except on your doctor's advice. Make sure that you are using your inhaler correctly. Ask you doctor or health care provider if you have any questions. A special MedGuide will be given to you by the pharmacist with each prescription and refill. Be sure to read this information carefully each time. Talk to your pediatrician regarding the use of this medicine in children. Special care may be needed. Overdosage: If you think you have taken too much of this  medicine contact a poison control center or emergency room at once. NOTE: This medicine is only for you. Do not share this medicine with others. What if I miss a dose? If you miss a dose, use it as soon as you can. If it is almost time for your next dose, use only that dose and continue with your regular schedule. Do not use double or extra doses. What may interact with this medicine? Do not take this medicine with any of the following medications: -cisapride -dofetilide -dronedarone -MAOIs like Carbex, Eldepryl, Marplan, Nardil, and Parnate -pimozide -thioridazine -ziprasidone This medicine may also interact with the following medications: -antihistamines for allergy -antiviral medicines for HIV or AIDS -atropine -beta-blockers like metoprolol and propranolol -certain medicines for bladder problems like oxybutynin, tolterodine -certain medicines for depression, anxiety, or psychotic disturbances -certain medicines for Parkinson's disease like benztropine, trihexyphenidyl -certain medicines for stomach problems like dicyclomine, hyoscyamine -certain medicines for travel sickness like scopolamine -diuretics -ipratropium -medicines for colds -medicines for fungal infections like ketoconazole and itraconazole -other medicines for breathing problems -other medicines that prolong the QT interval (cause an abnormal heart rhythm) -tiotropium This list may not describe all possible interactions. Give your health care provider a list of all the medicines, herbs, non-prescription drugs, or dietary supplements you use. Also tell them if you smoke, drink alcohol, or use illegal drugs. Some items may interact with your medicine. What should I watch for while using this medicine? Visit your doctor or health care professional for regular checkups. Tell your doctor or health care professional if your symptoms do not get better. If your symptoms get worse or if you need your short-acting inhalers  more often, call your doctor right away. Do not use this medicine more than once every 24 hours. What side effects may I notice from receiving this medicine? Side effects that you should report to your doctor or health care professional as soon as possible: -allergic reactions like skin rash or hives, swelling of the face, lips, or tongue -breathing problems right after inhaling your medicine -changes in vision -chest pain -eye pain -fast, irregular heartbeat -feeling faint or lightheaded, falls -fever or chills -nausea, vomiting -trouble passing urine or change in the amount of urine Side effects that usually do not require medical attention (report to your doctor or health care professional if they continue or are bothersome): -constipation -cough -diarrhea -headache -muscle cramps -nervousness -sore throat -tremor This list may not describe all possible side effects. Call your doctor for medical advice about side effects. You may report side effects to FDA at 1-800-FDA-1088. Where should I keep my medicine? Keep out of the reach of children. Store at room temperature between 15 and 30 degrees C (59 and 86 degrees F). Store in a dry place away from direct heat or sunlight. Throw away 6 weeks after you remove the inhaler from the foil tray, or after the dose indicator reads 0, whichever comes first. Throw away any unopened packages after the expiration date. NOTE: This sheet is a summary. It may not cover all possible information. If you have questions about this medicine, talk to your doctor, pharmacist, or health care provider.  2018 Elsevier/Gold Standard (2016-01-04 13:44:47)

## 2016-08-18 NOTE — Progress Notes (Signed)
[name] is a 56 y.o. female who presents to [place]: [place] today for follow-up breathing. Patient continues to experience shortness of breath. She notes this is much better with albuterol. The shortness of breath interferes with her ability to exert herself normally. She denies chest pain or palpitations. She had spirometry today with results listed below.   Past Medical History:  Diagnosis Date  . ALLERGIC RHINITIS 04/10/2008  . Carpal tunnel syndrome of right wrist   . DEPRESSION 08/13/2008  . Diabetes mellitus without complication ([place])   . HYPERLIPIDEMIA 08/04/2006  . HYPERTENSION 08/04/2006  . HYPOTHYROIDISM 08/04/2006  . LOW BACK PAIN 08/04/2006  . Neuromuscular disorder ([place])   . PERIMENOPAUSAL SYNDROME 05/08/2008  . PVD (peripheral vascular disease) ([place])    occlusive, status post bifem bypass 2007   Past Surgical History:  Procedure Laterality Date  . CARPAL TUNNEL RELEASE Right 04/05/2016   Procedure: OPEN RIGHT CARPAL TUNNEL RELEASE;  Surgeon: Jessy Oto, MD;  Location: [facility];  Service: Orthopedics;  Laterality: Right;  . CHOLECYSTECTOMY    . FEMORAL BYPASS     bifem.  Marland Kitchen HAGLAND'S DEFORMITY EXCISION    . TUBAL LIGATION     Social History  Substance Use Topics  . Smoking status: Former Smoker    Quit date: 02/15/2004  . Smokeless tobacco: Never Used  . Alcohol use No   family history includes Alcohol abuse in her brother and maternal uncle; Diabetes in her maternal grandmother; Heart disease in her father; Hyperlipidemia in her father; Hypertension in her father and mother; Stroke in her maternal uncle.  ROS as above:  Medications: Current Outpatient Prescriptions  Medication Sig Dispense Refill  . albuterol (PROVENTIL HFA;VENTOLIN HFA) 108 (90 Base) MCG/ACT inhaler Inhale 2 puffs into the lungs every 6 (six) hours as needed  for wheezing or shortness of breath. 1 Inhaler 2  . benazepril (LOTENSIN) 10 MG tablet Take 1 tablet (10 mg total) by mouth daily. 90 tablet 3  . diclofenac sodium (VOLTAREN) 1 % GEL Apply 2 g topically 4 (four) times daily. To affected joint. 100 g 11  . fexofenadine (ALLEGRA) 180 MG tablet Take 1 tablet (180 mg total) by mouth daily. 30 tablet 12  . levothyroxine (SYNTHROID, LEVOTHROID) 88 MCG tablet Take 1 tablet (88 mcg total) by mouth daily before breakfast. Appt needed for refills. 90 tablet 0  . metoprolol (LOPRESSOR) 50 MG tablet Take 1 tablet (50 mg total) by mouth 2 (two) times daily. 180 tablet 3  . venlafaxine XR (EFFEXOR-XR) 75 MG 24 hr capsule Take 1 capsule (75 mg total) by mouth daily with breakfast. Patient needs to schedule a follow up appointment before more refills. 90 capsule 3  . umeclidinium-vilanterol (ANORO ELLIPTA) 62.5-25 MCG/INH AEPB Inhale 1 puff into the lungs daily. 60 each 12   No current facility-administered medications for this visit.    Allergies  Allergen Reactions  . Gluten Meal Diarrhea  . Influenza Virus Vacc Split Pf Other (See Comments)    Swelling around injection site  . Lipitor [Atorvastatin] Other (See Comments)    Dizziness.     Health Maintenance Health Maintenance  Topic Date Due  . PAP SMEAR  11/14/2016 (Originally 01/17/2016)  . OPHTHALMOLOGY EXAM  02/14/2017 (Originally 04/08/1970)  . COLONOSCOPY  02/14/2018 (Originally 04/08/2010)  . HEMOGLOBIN A1C  01/24/2017  . MAMMOGRAM  02/03/2017  . FOOT EXAM  08/09/2017  . TETANUS/TDAP  08/14/2018  .  PNEUMOCOCCAL POLYSACCHARIDE VACCINE (2) 02/11/2020  . Hepatitis C Screening  Completed  . HIV Screening  Completed     Exam:  BP 134/83   Pulse 87   Wt 213 lb (96.6 kg)   BMI 36.56 kg/m  Gen: Well NAD HEENT: EOMI,  MMM Lungs: Normal work of breathing. CTABL Heart: RRR no MRG Abd: NABS, Soft. Nondistended, Nontender Exts: Brisk capillary refill, warm and well perfused.       No  results found for this or any previous visit (from the past 72 hour(s)). No results found.    Assessment and Plan: 56 y.o. female with shortness of breath likely due to COPD. Patient has limitation on FEV1 that does not change with albuterol. Plan for treatment with Anoro for 1 month.  Will follow up in 1 month for recheck.    Orders Placed This Encounter  Procedures  . Spirometry: Pre & Post Eval   Meds ordered this encounter  Medications  . umeclidinium-vilanterol (ANORO ELLIPTA) 62.5-25 MCG/INH AEPB    Sig: Inhale 1 puff into the lungs daily.    Dispense:  60 each    Refill:  12     Discussed warning signs or symptoms. Please see discharge instructions. Patient expresses understanding.

## 2016-09-26 ENCOUNTER — Ambulatory Visit (INDEPENDENT_AMBULATORY_CARE_PROVIDER_SITE_OTHER): Payer: PRIVATE HEALTH INSURANCE | Admitting: Family Medicine

## 2016-09-26 ENCOUNTER — Encounter: Payer: Self-pay | Admitting: Family Medicine

## 2016-09-26 VITALS — BP 138/71 | HR 75 | Ht 64.0 in | Wt 212.0 lb

## 2016-09-26 DIAGNOSIS — R0600 Dyspnea, unspecified: Secondary | ICD-10-CM | POA: Diagnosis not present

## 2016-09-26 DIAGNOSIS — E890 Postprocedural hypothyroidism: Secondary | ICD-10-CM | POA: Diagnosis not present

## 2016-09-26 DIAGNOSIS — E1159 Type 2 diabetes mellitus with other circulatory complications: Secondary | ICD-10-CM

## 2016-09-26 DIAGNOSIS — R109 Unspecified abdominal pain: Secondary | ICD-10-CM | POA: Diagnosis not present

## 2016-09-26 MED ORDER — DICYCLOMINE HCL 10 MG PO CAPS: 10.0000 mg | ORAL_CAPSULE | Freq: Three times a day (TID) | ORAL | 12 refills | Status: DC

## 2016-09-26 NOTE — Progress Notes (Signed)
[name] is a 56 y.o. female who presents to [facility]: [place] today for 1 month follow-up for shortness of breath.  Shortness of breath: Patient reports that shortness of breath improved with Anoro. Patient also reports increased headaches and jitteriness. Given these symptoms, she decided to stop taking the medication for 2 days. Patient reports increased shortness of breath during this time and increased albuterol use to 1-2x/day. Patient has since restarted Anoro without recurrence of headaches or jitteriness. Patient reports increased stress at work and expresses concern that this could have contributed to her headaches. She has not experienced the headaches or jitteriness since restarting Anoro. Patient has been able to increase her activity level and exercise without feeling as short of breath when taking Anoro.   Urgency of bowel movements: Patient reports increased urgency with bowel movements. She has experienced this in the past, but feels it has progressed over the past year. Patient does not attribute these symptoms to recently starting Anoro. Patient endorses changing her diet last week, but states that her urgency began before this. Patient reports normal bowel movements and denies any diarrhea.   Patient denies any recent fevers, chills, weight loss, chest pain or palpitations.    Past Medical History:  Diagnosis Date  . ALLERGIC RHINITIS 04/10/2008  . Carpal tunnel syndrome of right wrist   . DEPRESSION 08/13/2008  . Diabetes mellitus without complication ([place])   . HYPERLIPIDEMIA 08/04/2006  . HYPERTENSION 08/04/2006  . HYPOTHYROIDISM 08/04/2006  . LOW BACK PAIN 08/04/2006  . Neuromuscular disorder ([place])   . PERIMENOPAUSAL SYNDROME 05/08/2008  . PVD (peripheral vascular disease) ([place])    occlusive, status post bifem bypass 2007   Past Surgical History:    Procedure Laterality Date  . CARPAL TUNNEL RELEASE Right 04/05/2016   Procedure: OPEN RIGHT CARPAL TUNNEL RELEASE;  Surgeon: Jessy Oto, MD;  Location: [facility];  Service: Orthopedics;  Laterality: Right;  . CHOLECYSTECTOMY    . FEMORAL BYPASS     bifem.  Marland Kitchen HAGLAND'S DEFORMITY EXCISION    . TUBAL LIGATION     Social History  Substance Use Topics  . Smoking status: Former Smoker    Quit date: 02/15/2004  . Smokeless tobacco: Never Used  . Alcohol use No   family history includes Alcohol abuse in her brother and maternal uncle; Diabetes in her maternal grandmother; Heart disease in her father; Hyperlipidemia in her father; Hypertension in her father and mother; Stroke in her maternal uncle.  ROS as above:  Medications: Current Outpatient Prescriptions  Medication Sig Dispense Refill  . albuterol (PROVENTIL HFA;VENTOLIN HFA) 108 (90 Base) MCG/ACT inhaler Inhale 2 puffs into the lungs every 6 (six) hours as needed for wheezing or shortness of breath. 1 Inhaler 2  . benazepril (LOTENSIN) 10 MG tablet Take 1 tablet (10 mg total) by mouth daily. 90 tablet 3  . diclofenac sodium (VOLTAREN) 1 % GEL Apply 2 g topically 4 (four) times daily. To affected joint. 100 g 11  . fexofenadine (ALLEGRA) 180 MG tablet Take 1 tablet (180 mg total) by mouth daily. 30 tablet 12  . levothyroxine (SYNTHROID, LEVOTHROID) 88 MCG tablet Take 1 tablet (88 mcg total) by mouth daily before breakfast. Appt needed for refills. 90 tablet 0  . metoprolol (LOPRESSOR) 50 MG tablet Take 1 tablet (50 mg total) by mouth 2 (two) times daily. 180 tablet 3  . umeclidinium-vilanterol (ANORO ELLIPTA) 62.5-25 MCG/INH AEPB Inhale  1 puff into the lungs daily. 60 each 12  . venlafaxine XR (EFFEXOR-XR) 75 MG 24 hr capsule Take 1 capsule (75 mg total) by mouth daily with breakfast. Patient needs to schedule a follow up appointment before more refills. 90 capsule 3  . dicyclomine (BENTYL) 10 MG capsule Take 1 capsule  (10 mg total) by mouth 4 (four) times daily -  before meals and at bedtime. 120 capsule 12   No current facility-administered medications for this visit.    Allergies  Allergen Reactions  . Gluten Meal Diarrhea  . Influenza Virus Vacc Split Pf Other (See Comments)    Swelling around injection site  . Lipitor [Atorvastatin] Other (See Comments)    Dizziness.     Health Maintenance Health Maintenance  Topic Date Due  . PAP SMEAR  11/14/2016 (Originally 01/17/2016)  . OPHTHALMOLOGY EXAM  02/14/2017 (Originally 04/08/1970)  . COLONOSCOPY  02/14/2018 (Originally 04/08/2010)  . HEMOGLOBIN A1C  01/24/2017  . MAMMOGRAM  02/03/2017  . FOOT EXAM  08/09/2017  . TETANUS/TDAP  08/14/2018  . PNEUMOCOCCAL POLYSACCHARIDE VACCINE (2) 02/11/2020  . Hepatitis C Screening  Completed  . HIV Screening  Completed     Exam:  BP 138/71   Pulse 75   Ht 5' 4"  (1.626 m)   Wt 212 lb (96.2 kg)   SpO2 97%   BMI 36.39 kg/m  Gen: Well NAD HEENT: EOMI,  MMM Lungs: Normal work of breathing. CTABL, decreased lung sounds throughout Heart: RRR, normal S1 and S2, no MRG Abd: NABS, Soft. Nondistended, Nontender Exts: Brisk capillary refill, warm and well perfused.    No results found for this or any previous visit (from the past 72 hour(s)). No results found.    Assessment and Plan: 56 y.o. female for 1 month follow-up for shortness of breath. Patient's shortness of breath has improved when using Anoro. She should continue to use this consistently. Patient will follow-up if she develops any worsening shortness of breath.  Patient reports increased urgency with defecation. This is most likely secondary to increased gut motility. She will start a trial of dicyclomine and continue to monitor symptoms for improvement.  Patient will also have her thyroid levels rechecked today.  Hypothyroid: Will recheck thyroid labs.     Orders Placed This Encounter  Procedures  . TSH  . T4, free  . T3, free   Meds  ordered this encounter  Medications  . dicyclomine (BENTYL) 10 MG capsule    Sig: Take 1 capsule (10 mg total) by mouth 4 (four) times daily -  before meals and at bedtime.    Dispense:  120 capsule    Refill:  12     Discussed warning signs or symptoms. Please see discharge instructions. Patient expresses understanding.

## 2016-09-26 NOTE — Patient Instructions (Addendum)
Thank you for coming in today. For spasms consider Bentyl 4x daily.   Recheck as needed.    Continue Anoro.   Check thyroid labs today.   Check with me in about 2 months to follow up diabetes.   Dicyclomine tablets or capsules What is this medicine? DICYCLOMINE (dye SYE kloe meen) is used to treat bowel problems including irritable bowel syndrome. This medicine may be used for other purposes; ask your health care provider or pharmacist if you have questions. COMMON BRAND NAME(S): Bentyl What should I tell my health care provider before I take this medicine? They need to know if you have any of these conditions: -difficulty passing urine -esophagus problems or heartburn -glaucoma -heart disease, or previous heart attack -myasthenia gravis -prostate trouble -stomach infection, or obstruction -ulcerative colitis -an unusual or allergic reaction to dicyclomine, other medicines, foods, dyes, or preservatives -pregnant or trying to get pregnant -breast-feeding How should I use this medicine? Take this medicine by mouth with a glass of water. Follow the directions on the prescription label. It is best to take this medicine on an empty stomach, 30 minutes to 1 hour before meals. Take your medicine at regular intervals. Do not take your medicine more often than directed. Talk to your pediatrician regarding the use of this medicine in children. Special care may be needed. While this drug may be prescribed for children as young as 46 months of age for selected conditions, precautions do apply. Patients over 73 years old may have a stronger reaction and need a smaller dose. Overdosage: If you think you have taken too much of this medicine contact a poison control center or emergency room at once. NOTE: This medicine is only for you. Do not share this medicine with others. What if I miss a dose? If you miss a dose, take it as soon as you can. If it is almost time for your next dose, take only  that dose. Do not take double or extra doses. What may interact with this medicine? -amantadine -antacids -benztropine -digoxin -disopyramide -medicines for allergies, colds and breathing difficulties -medicines for alzheimer's disease -medicines for anxiety or sleeping problems -medicines for depression or psychotic disturbances -medicines for diarrhea -medicines for pain -metoclopramide -tegaserod This list may not describe all possible interactions. Give your health care provider a list of all the medicines, herbs, non-prescription drugs, or dietary supplements you use. Also tell them if you smoke, drink alcohol, or use illegal drugs. Some items may interact with your medicine. What should I watch for while using this medicine? You may get drowsy, dizzy, or have blurred vision. Do not drive, use machinery, or do anything that needs mental alertness until you know how this medicine affects you. To reduce the risk of dizzy or fainting spells, do not sit or stand up quickly, especially if you are an older patient. Alcohol can make you more drowsy, avoid alcoholic drinks. Stay out of bright light and wear sunglasses if this medicine makes your eyes more sensitive to light. Avoid extreme heat (hot tubs, saunas). This medicine can cause you to sweat less than normal. Your body temperature could increase to dangerous levels, which may lead to heat stroke. Antacids can stop this medicine from working. If you get an upset stomach and want to take an antacid, make sure there is an interval of at least 1 to 2 hours before or after you take this medicine. Your mouth may get dry. Chewing sugarless gum or sucking hard candy, and drinking  plenty of water may help. Contact your doctor if the problem does not go away or is severe. What side effects may I notice from receiving this medicine? Side effects that you should report to your doctor or health care professional as soon as possible: -agitation,  nervousness, confusion -difficulty swallowing -dizziness, drowsiness -fast or slow heartbeat -hallucinations -pain or difficulty passing urine Side effects that usually do not require medical attention (report to your doctor or health care professional if they continue or are bothersome): -constipation -headache -nausea or vomiting -sexual difficulty This list may not describe all possible side effects. Call your doctor for medical advice about side effects. You may report side effects to FDA at 1-800-FDA-1088. Where should I keep my medicine? Keep out of the reach of children. Store at room temperature below 30 degrees C (86 degrees F). Protect from light. Throw away any unused medicine after the expiration date. NOTE: This sheet is a summary. It may not cover all possible information. If you have questions about this medicine, talk to your doctor, pharmacist, or health care provider.  2018 Elsevier/Gold Standard (2007-05-22 17:12:34)

## 2016-10-13 ENCOUNTER — Other Ambulatory Visit: Payer: Self-pay | Admitting: Internal Medicine

## 2016-10-13 ENCOUNTER — Other Ambulatory Visit: Payer: Self-pay | Admitting: Family Medicine

## 2016-11-06 ENCOUNTER — Other Ambulatory Visit: Payer: Self-pay | Admitting: Family Medicine

## 2016-11-25 ENCOUNTER — Encounter: Payer: Self-pay | Admitting: Family Medicine

## 2016-11-25 ENCOUNTER — Other Ambulatory Visit: Payer: Self-pay | Admitting: Family Medicine

## 2016-11-25 ENCOUNTER — Ambulatory Visit (INDEPENDENT_AMBULATORY_CARE_PROVIDER_SITE_OTHER): Payer: PRIVATE HEALTH INSURANCE | Admitting: Family Medicine

## 2016-11-25 VITALS — BP 116/72 | HR 71 | Ht 64.0 in | Wt 200.0 lb

## 2016-11-25 DIAGNOSIS — E1159 Type 2 diabetes mellitus with other circulatory complications: Secondary | ICD-10-CM | POA: Diagnosis not present

## 2016-11-25 DIAGNOSIS — E890 Postprocedural hypothyroidism: Secondary | ICD-10-CM

## 2016-11-25 DIAGNOSIS — R0602 Shortness of breath: Secondary | ICD-10-CM

## 2016-11-25 DIAGNOSIS — Z124 Encounter for screening for malignant neoplasm of cervix: Secondary | ICD-10-CM

## 2016-11-25 LAB — POCT GLYCOSYLATED HEMOGLOBIN (HGB A1C): Hemoglobin A1C: 6.5

## 2016-11-25 NOTE — Progress Notes (Signed)
[name] is a 56 y.o. female who presents to [facility]: [facility] today for follow-up diabetes shortness of breath and thyroid.  Diabetes: Patient was diagnosed with type 2 diabetes at the last visit. In the interim she has used an improved diet to control her blood sugar. She's managed to lose some weight and is feeling well. She denies any bowel bladder dysfunction.  Shortness of breath: Patient has unexplained shortness of breath. She had a normal spirometry several months ago. In the interim we've been using Anoro which does help some. She notes she continues to have activity limiting shortness of breath. I offered a pulmonology referral and she like to see a pulmonologist that her [facility] sees at [facility]. She denies any significant cough or wheeze.  Thyroid: Patient has hypothyroidism. She currently takes levothyroxine daily. It's been sometime since her last check she feels pretty well with no feeling hot or cold or skin or nail change.   Past Medical History:  Diagnosis Date  . ALLERGIC RHINITIS 04/10/2008  . Carpal tunnel syndrome of right wrist   . DEPRESSION 08/13/2008  . Diabetes mellitus without complication ([place])   . HYPERLIPIDEMIA 08/04/2006  . HYPERTENSION 08/04/2006  . HYPOTHYROIDISM 08/04/2006  . LOW BACK PAIN 08/04/2006  . Neuromuscular disorder ([place])   . PERIMENOPAUSAL SYNDROME 05/08/2008  . PVD (peripheral vascular disease) ([place])    occlusive, status post bifem bypass 2007   Past Surgical History:  Procedure Laterality Date  . CARPAL TUNNEL RELEASE Right 04/05/2016   Procedure: OPEN RIGHT CARPAL TUNNEL RELEASE;  Surgeon: Jessy Oto, MD;  Location: [facility];  Service: Orthopedics;  Laterality: Right;  . CHOLECYSTECTOMY    . FEMORAL BYPASS     bifem.  Marland Kitchen HAGLAND'S DEFORMITY EXCISION    . TUBAL LIGATION     Social History  Substance Use  Topics  . Smoking status: Former Smoker    Quit date: 02/15/2004  . Smokeless tobacco: Never Used  . Alcohol use No   family history includes Alcohol abuse in her brother and maternal uncle; Diabetes in her maternal grandmother; Heart disease in her father; Hyperlipidemia in her father; Hypertension in her father and mother; Stroke in her maternal uncle.  ROS as above:  Medications: Current Outpatient Prescriptions  Medication Sig Dispense Refill  . albuterol (PROVENTIL HFA;VENTOLIN HFA) 108 (90 Base) MCG/ACT inhaler Inhale 2 puffs into the lungs every 6 (six) hours as needed for wheezing or shortness of breath. 1 Inhaler 2  . benazepril (LOTENSIN) 10 MG tablet Take 1 tablet (10 mg total) by mouth daily. 90 tablet 3  . diclofenac sodium (VOLTAREN) 1 % GEL Apply 2 g topically 4 (four) times daily. To affected joint. 100 g 11  . dicyclomine (BENTYL) 10 MG capsule Take 1 capsule (10 mg total) by mouth 4 (four) times daily -  before meals and at bedtime. 120 capsule 12  . fexofenadine (ALLEGRA) 180 MG tablet Take 1 tablet (180 mg total) by mouth daily. 30 tablet 12  . levothyroxine (SYNTHROID, LEVOTHROID) 88 MCG tablet TAKE 1 TABLET BY MOUTH DAILY BEFORE BREAKFAST 90 tablet 0  . metoprolol (LOPRESSOR) 50 MG tablet Take 1 tablet (50 mg total) by mouth 2 (two) times daily. 180 tablet 3  . umeclidinium-vilanterol (ANORO ELLIPTA) 62.5-25 MCG/INH AEPB Inhale 1 puff into the lungs daily. 60 each 12  . venlafaxine XR (EFFEXOR-XR) 75 MG 24 hr capsule TAKE 1 CAPSULE(75 MG) BY  MOUTH DAILY WITH BREAKFAST. FOLLOW UP APPOINTMENT BEFORE MORE REFILLS 90 capsule 0   No current facility-administered medications for this visit.    Allergies  Allergen Reactions  . Gluten Meal Diarrhea  . Influenza Virus Vacc Split Pf Other (See Comments)    Swelling around injection site  . Lipitor [Atorvastatin] Other (See Comments)    Dizziness.     Health Maintenance Health Maintenance  Topic Date Due  . PAP SMEAR   01/17/2016  . OPHTHALMOLOGY EXAM  02/14/2017 (Originally 04/08/1970)  . COLONOSCOPY  02/14/2018 (Originally 04/08/2010)  . HEMOGLOBIN A1C  01/24/2017  . MAMMOGRAM  02/03/2017  . FOOT EXAM  08/09/2017  . TETANUS/TDAP  08/14/2018  . PNEUMOCOCCAL POLYSACCHARIDE VACCINE (2) 02/11/2020  . Hepatitis C Screening  Completed  . HIV Screening  Completed     Exam:  BP 116/72   Pulse 71   Ht 5' 4"  (1.626 m)   Wt 200 lb (90.7 kg)   BMI 34.33 kg/m   Wt Readings from Last 5 Encounters:  11/25/16 200 lb (90.7 kg)  09/26/16 212 lb (96.2 kg)  08/18/16 213 lb (96.6 kg)  08/09/16 215 lb (97.5 kg)  07/12/16 218 lb (98.9 kg)    Gen: Well NAD HEENT: EOMI,  MMM Lungs: Normal work of breathing. CTABL Heart: RRR no MRG Abd: NABS, Soft. Nondistended, Nontender Exts: Brisk capillary refill, warm and well perfused.    Results for orders placed or performed in visit on 11/25/16 (from the past 72 hour(s))  POCT HgB A1C     Status: None   Collection Time: 11/25/16  8:54 AM  Result Value Ref Range   Hemoglobin A1C 6.5    No results found.    Assessment and Plan: 56 y.o. female with diabetes doing pretty well. Plan to continue lifestyle modification and recheck in 3 months. Recommend diabetic eye exam.  Lung disease: Unclear etiology. Spirometry was unremarkable previously. Plan to refer to pulmonology.  Thyroid: Patient due for recheck labs. Check TSH T3 and T4. Recheck in 3 months.  Cervical cancer screening due. Patient requests referral to OB/GYN. Referral placed.   Orders Placed This Encounter  Procedures  . TSH  . T4, free  . T3, free  . Ambulatory referral to Pulmonology    Referral Priority:   Routine    Referral Type:   Consultation    Referral Reason:   Specialty Services Required    Requested Specialty:   Pulmonary Disease    Number of Visits Requested:   1  . Ambulatory referral to Obstetrics / Gynecology    Referral Priority:   Routine    Referral Type:   Consultation     Referral Reason:   Specialty Services Required    Requested Specialty:   Obstetrics and Gynecology    Number of Visits Requested:   1  . POCT HgB A1C   No orders of the defined types were placed in this encounter.    Discussed warning signs or symptoms. Please see discharge instructions. Patient expresses understanding.

## 2016-11-25 NOTE — Patient Instructions (Addendum)
Thank you for coming in today. You should hear from [facility] soon.  Recheck with me in 3 months.  Get thryoid labs today.  You should hear from OBGYN about pap smear.  Recheck sooner if needed.

## 2016-11-26 LAB — TSH: TSH: 10.47 mIU/L — ABNORMAL HIGH (ref 0.40–4.50)

## 2016-11-26 LAB — T3, FREE: T3 FREE: 2.4 pg/mL (ref 2.3–4.2)

## 2016-11-26 LAB — T4, FREE: Free T4: 0.9 ng/dL (ref 0.8–1.8)

## 2016-11-28 MED ORDER — LEVOTHYROXINE SODIUM 100 MCG PO TABS: 100.0000 ug | ORAL_TABLET | Freq: Every day | ORAL | 0 refills | Status: DC

## 2016-11-28 NOTE — Addendum Note (Signed)
Addended by: Gregor Hams on: 11/28/2016 07:09 AM   Modules accepted: Orders

## 2016-12-03 ENCOUNTER — Other Ambulatory Visit: Payer: Self-pay | Admitting: Family Medicine

## 2016-12-03 DIAGNOSIS — I10 Essential (primary) hypertension: Secondary | ICD-10-CM

## 2016-12-23 ENCOUNTER — Ambulatory Visit: Payer: PRIVATE HEALTH INSURANCE | Admitting: Family Medicine

## 2017-01-15 ENCOUNTER — Other Ambulatory Visit: Payer: Self-pay | Admitting: Family Medicine

## 2017-02-20 ENCOUNTER — Other Ambulatory Visit: Payer: Self-pay | Admitting: Family Medicine

## 2017-02-23 ENCOUNTER — Other Ambulatory Visit: Payer: Self-pay | Admitting: Family Medicine

## 2017-02-24 ENCOUNTER — Ambulatory Visit: Payer: PRIVATE HEALTH INSURANCE | Admitting: Family Medicine

## 2017-02-24 ENCOUNTER — Encounter: Payer: Self-pay | Admitting: Family Medicine

## 2017-02-24 ENCOUNTER — Other Ambulatory Visit: Payer: Self-pay | Admitting: Family Medicine

## 2017-02-24 VITALS — BP 120/72 | HR 66 | Ht 64.0 in | Wt 217.0 lb

## 2017-02-24 DIAGNOSIS — R0602 Shortness of breath: Secondary | ICD-10-CM

## 2017-02-24 DIAGNOSIS — E1159 Type 2 diabetes mellitus with other circulatory complications: Secondary | ICD-10-CM | POA: Diagnosis not present

## 2017-02-24 DIAGNOSIS — Z1231 Encounter for screening mammogram for malignant neoplasm of breast: Secondary | ICD-10-CM

## 2017-02-24 DIAGNOSIS — Z1239 Encounter for other screening for malignant neoplasm of breast: Secondary | ICD-10-CM

## 2017-02-24 DIAGNOSIS — E890 Postprocedural hypothyroidism: Secondary | ICD-10-CM

## 2017-02-24 NOTE — Patient Instructions (Signed)
Thank you for coming in today. Get labs today.  Talk to the xray people about mammogram.  Recheck with me in about 3 months.  I will adjust Thyroid medicine.

## 2017-02-24 NOTE — Progress Notes (Signed)
[name] is a 57 y.o. female who presents to [facility] today for follow-up breathing, diabetes, and hypothyroidism.  Breathing: [name] has had chronic cough and wheezing.  She was seen by a pulmonologist and after a long workup is feeling much better.  She received a long course of antibiotics and is now taking Brio.  She notes that she is effectively asymptomatic and no longer having to use her rescue inhaler.  Hypothyroidism: Says he has a history of hypothyroidism and about 3 months ago her levothyroxine dose was increased.  She is feeling better and not feeling hot or cold or skin hair or nail changes.  Diabetes: [name] has diabetes that typically is controlled with diet.  She denies polyuria or polydipsia or elevated blood sugar.   Past Medical History:  Diagnosis Date  . ALLERGIC RHINITIS 04/10/2008  . Carpal tunnel syndrome of right wrist   . DEPRESSION 08/13/2008  . Diabetes mellitus without complication ([place])   . HYPERLIPIDEMIA 08/04/2006  . HYPERTENSION 08/04/2006  . HYPOTHYROIDISM 08/04/2006  . LOW BACK PAIN 08/04/2006  . Neuromuscular disorder ([place])   . PERIMENOPAUSAL SYNDROME 05/08/2008  . PVD (peripheral vascular disease) ([place])    occlusive, status post bifem bypass 2007   Past Surgical History:  Procedure Laterality Date  . CARPAL TUNNEL RELEASE Right 04/05/2016   Procedure: OPEN RIGHT CARPAL TUNNEL RELEASE;  Surgeon: Jessy Oto, MD;  Location: [facility];  Service: Orthopedics;  Laterality: Right;  . CHOLECYSTECTOMY    . FEMORAL BYPASS     bifem.  Marland Kitchen HAGLAND'S DEFORMITY EXCISION    . TUBAL LIGATION     Social History   Tobacco Use  . Smoking status: Former Smoker    Last attempt to quit: 02/15/2004    Years since quitting: 13.0  . Smokeless tobacco: Never Used  Substance Use Topics  . Alcohol use: No   family history  includes Alcohol abuse in her brother and maternal uncle; Diabetes in her maternal grandmother; Heart disease in her father; Hyperlipidemia in her father; Hypertension in her father and mother; Stroke in her maternal uncle.  ROS as above:  Medications: Current Outpatient Medications  Medication Sig Dispense Refill  . albuterol (PROVENTIL HFA;VENTOLIN HFA) 108 (90 Base) MCG/ACT inhaler Inhale 2 puffs into the lungs every 6 (six) hours as needed for wheezing or shortness of breath. 1 Inhaler 2  . benazepril (LOTENSIN) 10 MG tablet TAKE 1 TABLET(10 MG) BY MOUTH DAILY 90 tablet 0  . diclofenac sodium (VOLTAREN) 1 % GEL Apply 2 g topically 4 (four) times daily. To affected joint. 100 g 11  . dicyclomine (BENTYL) 10 MG capsule Take 1 capsule (10 mg total) by mouth 4 (four) times daily -  before meals and at bedtime. 120 capsule 12  . fexofenadine (ALLEGRA) 180 MG tablet Take 1 tablet (180 mg total) by mouth daily. 30 tablet 12  . fluticasone furoate-vilanterol (BREO ELLIPTA) 100-25 MCG/INH AEPB Inhale into the lungs.    Marland Kitchen levothyroxine (SYNTHROID, LEVOTHROID) 100 MCG tablet Take 1 tablet (100 mcg total) by mouth daily. 90 tablet 0  . metoprolol (LOPRESSOR) 50 MG tablet Take 1 tablet (50 mg total) by mouth 2 (two) times daily. 180 tablet 3  . venlafaxine XR (EFFEXOR-XR) 75 MG 24 hr capsule TAKE 1 CAPSULE(75 MG) BY MOUTH DAILY WITH BREAKFAST. FOLLOW UP APPOINTMENT BEFORE MORE REFILLS 90 capsule 0   No current facility-administered medications for this  visit.    Allergies  Allergen Reactions  . Gluten Meal Diarrhea  . Influenza Virus Vacc Split Pf Other (See Comments)    Swelling around injection site  . Lipitor [Atorvastatin] Other (See Comments)    Dizziness.     Health Maintenance Health Maintenance  Topic Date Due  . OPHTHALMOLOGY EXAM  04/08/1970  . PAP SMEAR  01/17/2016  . MAMMOGRAM  02/03/2017  . COLONOSCOPY  02/14/2018 (Originally 04/08/2010)  . HEMOGLOBIN A1C  05/26/2017  . FOOT  EXAM  08/09/2017  . TETANUS/TDAP  08/14/2018  . PNEUMOCOCCAL POLYSACCHARIDE VACCINE (2) 02/11/2020  . Hepatitis C Screening  Completed  . HIV Screening  Completed     Exam:  BP 120/72   Pulse 66   Ht 5' 4"  (1.626 m)   Wt 217 lb (98.4 kg)   BMI 37.25 kg/m  Gen: Well NAD HEENT: EOMI,  MMM no goiter Lungs: Normal work of breathing. CTABL Heart: RRR no MRG Abd: NABS, Soft. Nondistended, Nontender Exts: Brisk capillary refill, warm and well perfused.    No results found for this or any previous visit (from the past 72 hour(s)). No results found.    Assessment and Plan: 57 y.o. female with  Breathing: Much improved.  Continue Brio and recheck as needed.  Hypothyroidism seems to be doing well.  Check TSH and adjust levothyroxine  Diabetes: Clinically doing well.  Will check A1c and adjust as needed.  Screening will order mammogram for breast cancer screening.  [name] will arrange for a Pap smear in the near future as well.   Orders Placed This Encounter  Procedures  . MM SCREENING BREAST TOMO BILATERAL    Standing Status:   Future    Standing Expiration Date:   04/25/2018    Order Specific Question:   Reason for Exam (SYMPTOM  OR DIAGNOSIS REQUIRED)    Answer:   screen breast cancer    Order Specific Question:   Is the patient pregnant?    Answer:   No    Order Specific Question:   Preferred imaging location?    Answer:   [facility]  . TSH  . Hemoglobin A1c   No orders of the defined types were placed in this encounter.    Discussed warning signs or symptoms. Please see discharge instructions. Patient expresses understanding.

## 2017-02-25 ENCOUNTER — Other Ambulatory Visit: Payer: Self-pay | Admitting: Family Medicine

## 2017-02-25 LAB — HEMOGLOBIN A1C
EAG (MMOL/L): 7.6 (calc)
Hgb A1c MFr Bld: 6.4 % of total Hgb — ABNORMAL HIGH (ref <5.7)
Mean Plasma Glucose: 137 (calc)

## 2017-02-25 LAB — TSH: TSH: 0.61 mIU/L (ref 0.40–4.50)

## 2017-02-25 MED ORDER — LEVOTHYROXINE SODIUM 100 MCG PO TABS: 100.0000 ug | ORAL_TABLET | Freq: Every day | ORAL | 1 refills | Status: DC

## 2017-03-08 ENCOUNTER — Other Ambulatory Visit: Payer: Self-pay | Admitting: Family Medicine

## 2017-03-08 DIAGNOSIS — I10 Essential (primary) hypertension: Secondary | ICD-10-CM

## 2017-03-27 ENCOUNTER — Other Ambulatory Visit: Payer: Self-pay | Admitting: Family Medicine

## 2017-04-12 ENCOUNTER — Encounter: Payer: Self-pay | Admitting: Family Medicine

## 2017-04-12 ENCOUNTER — Ambulatory Visit (INDEPENDENT_AMBULATORY_CARE_PROVIDER_SITE_OTHER): Payer: PRIVATE HEALTH INSURANCE | Admitting: Family Medicine

## 2017-04-12 VITALS — BP 155/76 | HR 74 | Temp 97.8°F | Ht 64.0 in | Wt 215.0 lb

## 2017-04-12 DIAGNOSIS — M722 Plantar fascial fibromatosis: Secondary | ICD-10-CM | POA: Diagnosis not present

## 2017-04-12 DIAGNOSIS — J4540 Moderate persistent asthma, uncomplicated: Secondary | ICD-10-CM | POA: Diagnosis not present

## 2017-04-12 DIAGNOSIS — J029 Acute pharyngitis, unspecified: Secondary | ICD-10-CM

## 2017-04-12 DIAGNOSIS — J069 Acute upper respiratory infection, unspecified: Secondary | ICD-10-CM | POA: Diagnosis not present

## 2017-04-12 DIAGNOSIS — Z20828 Contact with and (suspected) exposure to other viral communicable diseases: Secondary | ICD-10-CM | POA: Diagnosis not present

## 2017-04-12 DIAGNOSIS — J45909 Unspecified asthma, uncomplicated: Secondary | ICD-10-CM | POA: Insufficient documentation

## 2017-04-12 HISTORY — DX: Plantar fascial fibromatosis: M72.2

## 2017-04-12 LAB — POCT INFLUENZA A/B
INFLUENZA A, POC: NEGATIVE
Influenza B, POC: NEGATIVE

## 2017-04-12 MED ORDER — PREDNISONE 10 MG PO TABS: 30.0000 mg | ORAL_TABLET | Freq: Every day | ORAL | 0 refills | Status: DC

## 2017-04-12 MED ORDER — GUAIFENESIN-CODEINE 100-10 MG/5ML PO SOLN: 5.0000 mL | Freq: Four times a day (QID) | ORAL | 0 refills | Status: DC | PRN

## 2017-04-12 MED ORDER — BENZONATATE 200 MG PO CAPS: 200.0000 mg | ORAL_CAPSULE | Freq: Three times a day (TID) | ORAL | 0 refills | Status: DC | PRN

## 2017-04-12 NOTE — Patient Instructions (Signed)
Thank you for coming in today. Call or go to the emergency room if you get worse, have trouble breathing, have chest pains, or palpitations.  Take over the counter medicine like tylenol or ibuprofen for body aches and fever and chills.  Take tessalon pearles during the day for cough and codeine cough medicine at bedtime as needed.  Use prednisone if not better or if worse.  Call or go to the emergency room if you get worse, have trouble breathing, have chest pains, or palpitations.    Upper Respiratory Infection, Adult Most upper respiratory infections (URIs) are caused by a virus. A URI affects the nose, throat, and upper air passages. The most common type of URI is often called "the common cold." Follow these instructions at home:  Take medicines only as told by your doctor.  Gargle warm saltwater or take cough drops to comfort your throat as told by your doctor.  Use a warm mist humidifier or inhale steam from a shower to increase air moisture. This may make it easier to breathe.  Drink enough fluid to keep your pee (urine) clear or pale yellow.  Eat soups and other clear broths.  Have a healthy diet.  Rest as needed.  Go back to work when your fever is gone or your doctor says it is okay. ? You may need to stay home longer to avoid giving your URI to others. ? You can also wear a face mask and wash your hands often to prevent spread of the virus.  Use your inhaler more if you have asthma.  Do not use any tobacco products, including cigarettes, chewing tobacco, or electronic cigarettes. If you need help quitting, ask your doctor. Contact a doctor if:  You are getting worse, not better.  Your symptoms are not helped by medicine.  You have chills.  You are getting more short of breath.  You have brown or red mucus.  You have yellow or brown discharge from your nose.  You have pain in your face, especially when you bend forward.  You have a fever.  You have puffy  (swollen) neck glands.  You have pain while swallowing.  You have white areas in the back of your throat. Get help right away if:  You have very bad or constant: ? Headache. ? Ear pain. ? Pain in your forehead, behind your eyes, and over your cheekbones (sinus pain). ? Chest pain.  You have long-lasting (chronic) lung disease and any of the following: ? Wheezing. ? Long-lasting cough. ? Coughing up blood. ? A change in your usual mucus.  You have a stiff neck.  You have changes in your: ? Vision. ? Hearing. ? Thinking. ? Mood. This information is not intended to replace advice given to you by your health care provider. Make sure you discuss any questions you have with your health care provider. Document Released: 07/20/2007 Document Revised: 10/04/2015 Document Reviewed: 05/08/2013 Elsevier Interactive Patient Education  2018 [employer].

## 2017-04-12 NOTE — Progress Notes (Signed)
[name] is a 57 y.o. female who presents to [facility] today for cough, congestion and sore throat.  [name]'s entire family has been recently ill with upper respiratory infection.  Her husband developed secondary sickening and pneumonia a few days ago.  She notes a 1 day history of sore throat congestion and cough associate with body aches.  She is worried that she is getting sick with the same illness.  She has a history of asthma that caused a cough for 8 months last year.  She is worried she will get worse.  Additionally [name] notes pain in the right plantar foot present for 2 weeks. She notes that the pain is worse with the first step in the morning and better with rest. The pain is consistent with history of left plantar fasciitis. She has tried using an over the counter heel insert which is helpful.    Past Medical History:  Diagnosis Date  . ALLERGIC RHINITIS 04/10/2008  . Carpal tunnel syndrome of right wrist   . DEPRESSION 08/13/2008  . Diabetes mellitus without complication ([place])   . HYPERLIPIDEMIA 08/04/2006  . HYPERTENSION 08/04/2006  . HYPOTHYROIDISM 08/04/2006  . LOW BACK PAIN 08/04/2006  . Neuromuscular disorder ([place])   . PERIMENOPAUSAL SYNDROME 05/08/2008  . PVD (peripheral vascular disease) ([place])    occlusive, status post bifem bypass 2007   Past Surgical History:  Procedure Laterality Date  . CARPAL TUNNEL RELEASE Right 04/05/2016   Procedure: OPEN RIGHT CARPAL TUNNEL RELEASE;  Surgeon: Jessy Oto, MD;  Location: [facility];  Service: Orthopedics;  Laterality: Right;  . CHOLECYSTECTOMY    . FEMORAL BYPASS     bifem.  Marland Kitchen HAGLAND'S DEFORMITY EXCISION    . TUBAL LIGATION     Social History   Tobacco Use  . Smoking status: Former Smoker    Last attempt to quit: 02/15/2004    Years since quitting: 13.1  . Smokeless tobacco: Never  Used  Substance Use Topics  . Alcohol use: No   family history includes Alcohol abuse in her brother and maternal uncle; Diabetes in her maternal grandmother; Heart disease in her father; Hyperlipidemia in her father; Hypertension in her father and mother; Stroke in her maternal uncle.  ROS as above:  Medications: Current Outpatient Medications  Medication Sig Dispense Refill  . albuterol (PROVENTIL HFA;VENTOLIN HFA) 108 (90 Base) MCG/ACT inhaler Inhale 2 puffs into the lungs every 6 (six) hours as needed for wheezing or shortness of breath. 1 Inhaler 2  . benazepril (LOTENSIN) 10 MG tablet TAKE 1 TABLET(10 MG) BY MOUTH DAILY 90 tablet 0  . diclofenac sodium (VOLTAREN) 1 % GEL Apply 2 g topically 4 (four) times daily. To affected joint. 100 g 11  . dicyclomine (BENTYL) 10 MG capsule Take 1 capsule (10 mg total) by mouth 4 (four) times daily -  before meals and at bedtime. 120 capsule 12  . fexofenadine (ALLEGRA) 180 MG tablet Take 1 tablet (180 mg total) by mouth daily. 30 tablet 12  . fluticasone furoate-vilanterol (BREO ELLIPTA) 100-25 MCG/INH AEPB Inhale into the lungs.    Marland Kitchen levothyroxine (SYNTHROID, LEVOTHROID) 100 MCG tablet Take 1 tablet (100 mcg total) by mouth daily. 90 tablet 1  . metoprolol (LOPRESSOR) 50 MG tablet Take 1 tablet (50 mg total) by mouth 2 (two) times daily. 180 tablet 3  . metoprolol tartrate (LOPRESSOR) 50 MG tablet TAKE 1 TABLET(50 MG) BY MOUTH  TWICE DAILY 60 tablet 0  . venlafaxine XR (EFFEXOR-XR) 75 MG 24 hr capsule TAKE 1 CAPSULE(75 MG) BY MOUTH DAILY WITH BREAKFAST. FOLLOW UP APPOINTMENT BEFORE MORE REFILLS 90 capsule 0  . benzonatate (TESSALON) 200 MG capsule Take 1 capsule (200 mg total) by mouth 3 (three) times daily as needed for cough. 45 capsule 0  . guaiFENesin-codeine 100-10 MG/5ML syrup Take 5 mLs by mouth every 6 (six) hours as needed for cough. 120 mL 0  . predniSONE (DELTASONE) 10 MG tablet Take 3 tablets (30 mg total) by mouth daily with breakfast.  15 tablet 0   No current facility-administered medications for this visit.    Allergies  Allergen Reactions  . Gluten Meal Diarrhea  . Influenza Virus Vacc Split Pf Other (See Comments)    Swelling around injection site  . Lipitor [Atorvastatin] Other (See Comments)    Dizziness.     Health Maintenance Health Maintenance  Topic Date Due  . OPHTHALMOLOGY EXAM  04/08/1970  . PAP SMEAR  01/17/2016  . MAMMOGRAM  02/03/2017  . COLONOSCOPY  02/14/2018 (Originally 04/08/2010)  . FOOT EXAM  08/09/2017  . HEMOGLOBIN A1C  08/24/2017  . TETANUS/TDAP  08/14/2018  . PNEUMOCOCCAL POLYSACCHARIDE VACCINE (2) 02/11/2020  . Hepatitis C Screening  Completed  . HIV Screening  Completed     Exam:  BP (!) 155/76   Pulse 74   Temp 97.8 F (36.6 C) (Oral)   Ht 5' 4"  (1.626 m)   Wt 215 lb (97.5 kg)   BMI 36.90 kg/m  Gen: Well NAD nontoxic appearing HEENT: EOMI,  MMM clear nasal discharge.  Posterior pharynx with cobblestoning.  Mild cervical lymphadenopathy present bilaterally Lungs: Normal work of breathing. CTABL Heart: RRR no MRG Abd: NABS, Soft. Nondistended, Nontender Exts: Brisk capillary refill, warm and well perfused.  MSK: Right foot normal appearing.  TTP plantar fascia.  Normal motion.    Results for orders placed or performed in visit on 04/12/17 (from the past 72 hour(s))  POCT Influenza A/B     Status: None   Collection Time: 04/12/17 11:40 AM  Result Value Ref Range   Influenza A, POC Negative Negative   Influenza B, POC Negative Negative   No results found.    Assessment and Plan: 57 y.o. female with upper respiratory illness complicating history of possible RAD. Plan for treatment with prednisone, continued albuterol PRN and symptom control with OTC medication as well as tessalon and codeine. Recheck if not better.  Prednisone is a backup if worse or not better.   Right foot plantar fasciitis. Work on eccentric heel exercises and ice massage. Recheck if not  better.   Possible RAD improved with ICS. Continue BREO. Recheck in the near future.    Orders Placed This Encounter  Procedures  . POCT Influenza A/B   Meds ordered this encounter  Medications  . benzonatate (TESSALON) 200 MG capsule    Sig: Take 1 capsule (200 mg total) by mouth 3 (three) times daily as needed for cough.    Dispense:  45 capsule    Refill:  0  . guaiFENesin-codeine 100-10 MG/5ML syrup    Sig: Take 5 mLs by mouth every 6 (six) hours as needed for cough.    Dispense:  120 mL    Refill:  0  . predniSONE (DELTASONE) 10 MG tablet    Sig: Take 3 tablets (30 mg total) by mouth daily with breakfast.    Dispense:  15 tablet    Refill:  0     Discussed warning signs or symptoms. Please see discharge instructions. Patient expresses understanding.

## 2017-04-20 ENCOUNTER — Ambulatory Visit (INDEPENDENT_AMBULATORY_CARE_PROVIDER_SITE_OTHER): Payer: PRIVATE HEALTH INSURANCE | Admitting: Family Medicine

## 2017-04-20 ENCOUNTER — Encounter: Payer: Self-pay | Admitting: Family Medicine

## 2017-04-20 VITALS — BP 134/83 | HR 62 | Temp 97.7°F | Wt 217.0 lb

## 2017-04-20 DIAGNOSIS — J4540 Moderate persistent asthma, uncomplicated: Secondary | ICD-10-CM | POA: Diagnosis not present

## 2017-04-20 DIAGNOSIS — J0101 Acute recurrent maxillary sinusitis: Secondary | ICD-10-CM

## 2017-04-20 MED ORDER — ALBUTEROL SULFATE (2.5 MG/3ML) 0.083% IN NEBU: 2.5000 mg | INHALATION_SOLUTION | Freq: Four times a day (QID) | RESPIRATORY_TRACT | 1 refills | Status: DC | PRN

## 2017-04-20 MED ORDER — AZITHROMYCIN 250 MG PO TABS: 250.0000 mg | ORAL_TABLET | Freq: Every day | ORAL | 0 refills | Status: DC

## 2017-04-20 NOTE — Patient Instructions (Signed)
Thank you for coming in today. Use the nebulizer up to every 6 hours as needed.  Use the azithromycin antibiotics.  Recheck as needed.,

## 2017-04-20 NOTE — Progress Notes (Signed)
[name] is a 56 y.o. female who presents to [facility] today for cough. Lexani notes a PMH significant for RAD doing well on Breo. She notes that the cough has been ongoing for several weeks. She was seen on 04/12/17 where she was thought to have a viral URI or possible exacerbation of her reactive airway disease.  She was given typical symptomatic management with Tessalon and codeine cough syrup.  She was given a backup prednisone course as well.  She is completed the prednisone course and notes that helped only a little.  She continues bothersome cough and mild shortness of breath.  She notes new onset bilateral sinus pain and pressure as well.  She tried using her albuterol inhaler which helps a little.  She is interested in perhaps trying a nebulizer treatment.  She denies severe nasal discharge.   Past Medical History:  Diagnosis Date  . ALLERGIC RHINITIS 04/10/2008  . Carpal tunnel syndrome of right wrist   . DEPRESSION 08/13/2008  . Diabetes mellitus without complication ([place])   . HYPERLIPIDEMIA 08/04/2006  . HYPERTENSION 08/04/2006  . HYPOTHYROIDISM 08/04/2006  . LOW BACK PAIN 08/04/2006  . Neuromuscular disorder ([place])   . PERIMENOPAUSAL SYNDROME 05/08/2008  . PVD (peripheral vascular disease) ([place])    occlusive, status post bifem bypass 2007   Past Surgical History:  Procedure Laterality Date  . CARPAL TUNNEL RELEASE Right 04/05/2016   Procedure: OPEN RIGHT CARPAL TUNNEL RELEASE;  Surgeon: Jessy Oto, MD;  Location: [facility];  Service: Orthopedics;  Laterality: Right;  . CHOLECYSTECTOMY    . FEMORAL BYPASS     bifem.  Marland Kitchen HAGLAND'S DEFORMITY EXCISION    . TUBAL LIGATION     Social History   Tobacco Use  . Smoking status: Former Smoker    Last attempt to quit: 02/15/2004    Years since quitting: 13.1  . Smokeless tobacco: Never Used    Substance Use Topics  . Alcohol use: No   family history includes Alcohol abuse in her brother and maternal uncle; Diabetes in her maternal grandmother; Heart disease in her father; Hyperlipidemia in her father; Hypertension in her father and mother; Stroke in her maternal uncle.  ROS as above:  Medications: Current Outpatient Medications  Medication Sig Dispense Refill  . albuterol (PROVENTIL HFA;VENTOLIN HFA) 108 (90 Base) MCG/ACT inhaler Inhale 2 puffs into the lungs every 6 (six) hours as needed for wheezing or shortness of breath. 1 Inhaler 2  . benazepril (LOTENSIN) 10 MG tablet TAKE 1 TABLET(10 MG) BY MOUTH DAILY 90 tablet 0  . benzonatate (TESSALON) 200 MG capsule Take 1 capsule (200 mg total) by mouth 3 (three) times daily as needed for cough. 45 capsule 0  . diclofenac sodium (VOLTAREN) 1 % GEL Apply 2 g topically 4 (four) times daily. To affected joint. 100 g 11  . dicyclomine (BENTYL) 10 MG capsule Take 1 capsule (10 mg total) by mouth 4 (four) times daily -  before meals and at bedtime. 120 capsule 12  . fexofenadine (ALLEGRA) 180 MG tablet Take 1 tablet (180 mg total) by mouth daily. 30 tablet 12  . fluticasone furoate-vilanterol (BREO ELLIPTA) 100-25 MCG/INH AEPB Inhale into the lungs.    Marland Kitchen guaiFENesin-codeine 100-10 MG/5ML syrup Take 5 mLs by mouth every 6 (six) hours as needed for cough. 120 mL 0  . levothyroxine (SYNTHROID, LEVOTHROID) 100 MCG tablet Take 1 tablet (100 mcg total) by  mouth daily. 90 tablet 1  . metoprolol (LOPRESSOR) 50 MG tablet Take 1 tablet (50 mg total) by mouth 2 (two) times daily. 180 tablet 3  . metoprolol tartrate (LOPRESSOR) 50 MG tablet TAKE 1 TABLET(50 MG) BY MOUTH TWICE DAILY 60 tablet 0  . predniSONE (DELTASONE) 10 MG tablet Take 3 tablets (30 mg total) by mouth daily with breakfast. 15 tablet 0  . venlafaxine XR (EFFEXOR-XR) 75 MG 24 hr capsule TAKE 1 CAPSULE(75 MG) BY MOUTH DAILY WITH BREAKFAST. FOLLOW UP APPOINTMENT BEFORE MORE REFILLS 90  capsule 0  . albuterol (PROVENTIL) (2.5 MG/3ML) 0.083% nebulizer solution Take 3 mLs (2.5 mg total) by nebulization every 6 (six) hours as needed for wheezing or shortness of breath. 150 mL 1  . azithromycin (ZITHROMAX) 250 MG tablet Take 1 tablet (250 mg total) by mouth daily. Take first 2 tablets together, then 1 every day until finished. 6 tablet 0   No current facility-administered medications for this visit.    Allergies  Allergen Reactions  . Gluten Meal Diarrhea  . Influenza Virus Vacc Split Pf Other (See Comments)    Swelling around injection site  . Lipitor [Atorvastatin] Other (See Comments)    Dizziness.     Health Maintenance Health Maintenance  Topic Date Due  . OPHTHALMOLOGY EXAM  04/08/1970  . PAP SMEAR  01/17/2016  . MAMMOGRAM  02/03/2017  . COLONOSCOPY  02/14/2018 (Originally 04/08/2010)  . FOOT EXAM  08/09/2017  . HEMOGLOBIN A1C  08/24/2017  . TETANUS/TDAP  08/14/2018  . PNEUMOCOCCAL POLYSACCHARIDE VACCINE (2) 02/11/2020  . Hepatitis C Screening  Completed  . HIV Screening  Completed     Exam:  BP 134/83   Pulse 62   Temp 97.7 F (36.5 C) (Oral)   Wt 217 lb (98.4 kg)   SpO2 100%   BMI 37.25 kg/m  Gen: Well NAD HEENT: EOMI,  MMM clear nasal discharge.. Cervical lymphadenopathy.  Tenderness to palpation bilateral maxillary sinuses. Lungs: Normal work of breathing.  Slight prolonged expiratory phase.  Minimal wheezing. Heart: RRR no MRG Abd: NABS, Soft. Nondistended, Nontender Exts: Brisk capillary refill, warm and well perfused.    Patient was given a 2.5/0.5 mg DuoNeb nebulizer treatment and felt a lot better.   Assessment and Plan: 57 y.o. female with cough likely continued just finishing exacerbation of reactive airway disease possible with additional sinusitis.  Plan to dispense a nebulizer and prescribed albuterol.  Recently prescribed azithromycin for presumed sinusitis. Recheck in the near future if not improved.  Return sooner if  needed.    No orders of the defined types were placed in this encounter.  Meds ordered this encounter  Medications  . azithromycin (ZITHROMAX) 250 MG tablet    Sig: Take 1 tablet (250 mg total) by mouth daily. Take first 2 tablets together, then 1 every day until finished.    Dispense:  6 tablet    Refill:  0  . albuterol (PROVENTIL) (2.5 MG/3ML) 0.083% nebulizer solution    Sig: Take 3 mLs (2.5 mg total) by nebulization every 6 (six) hours as needed for wheezing or shortness of breath.    Dispense:  150 mL    Refill:  1     Discussed warning signs or symptoms. Please see discharge instructions. Patient expresses understanding.

## 2017-04-24 ENCOUNTER — Other Ambulatory Visit: Payer: Self-pay | Admitting: Family Medicine

## 2017-05-23 ENCOUNTER — Other Ambulatory Visit: Payer: Self-pay | Admitting: Family Medicine

## 2017-05-24 ENCOUNTER — Other Ambulatory Visit: Payer: Self-pay | Admitting: Family Medicine

## 2017-05-30 ENCOUNTER — Telehealth: Payer: Self-pay

## 2017-05-30 NOTE — Telephone Encounter (Signed)
Tomorrow double booked at 8:50 for both pt and her husband. 8:30 spot on Thursday has been blocked.  Pt advised of changes.

## 2017-05-30 NOTE — Telephone Encounter (Signed)
Okay to double book tomorrow.  Have them go ahead and block that 830 spot on Thursday that she has as I have a meeting Thursday morning.

## 2017-05-30 NOTE — Telephone Encounter (Signed)
Patient is currently scheduled for a follow up with Dr Georgina Snell on 06-01-17 at 8:30. Pt's husband, Marsela Kuan, is schedule for a follow up with Dr Georgina Snell on 05-31-17 at 8:50.  Pt wanted to know if she could be seen at the same time as her husband, tomorrow at 8:50, instead of having to come to the office two days in a row.   There are not currently any opening tomorrow AM to fit patient into, so if she was seen it would have to be a double book.  Dr Georgina Snell, please advise if this is something you would be ok to do for pt or let me know if I need to advise pt to keep her current appt on Thursday, and we can book the couple in close spots next time.  Thanks!

## 2017-05-31 ENCOUNTER — Encounter: Payer: Self-pay | Admitting: Family Medicine

## 2017-05-31 ENCOUNTER — Ambulatory Visit (INDEPENDENT_AMBULATORY_CARE_PROVIDER_SITE_OTHER): Payer: PRIVATE HEALTH INSURANCE | Admitting: Family Medicine

## 2017-05-31 VITALS — BP 124/80 | HR 69 | Ht 64.02 in | Wt 217.0 lb

## 2017-05-31 DIAGNOSIS — E1159 Type 2 diabetes mellitus with other circulatory complications: Secondary | ICD-10-CM

## 2017-05-31 DIAGNOSIS — J4540 Moderate persistent asthma, uncomplicated: Secondary | ICD-10-CM

## 2017-05-31 DIAGNOSIS — G43109 Migraine with aura, not intractable, without status migrainosus: Secondary | ICD-10-CM | POA: Insufficient documentation

## 2017-05-31 DIAGNOSIS — E890 Postprocedural hypothyroidism: Secondary | ICD-10-CM

## 2017-05-31 LAB — POCT GLYCOSYLATED HEMOGLOBIN (HGB A1C): HEMOGLOBIN A1C: 6.6

## 2017-05-31 NOTE — Progress Notes (Signed)
[name] is a 57 y.o. female who presents to [place]: [place] today for follow-up asthma/COPD and diabetes.  Satina notes her breathing has not done very well recently.  She continues to use Brio but notes continued wheezing and cough.  She continues allergy medications listed below and uses albuterol intermittently.  She notes she is a follow-up appointment with pulmonology in the near future.  She denies severe shortness of breath vomiting fevers or chills.  Diabetes: Ta has diabetes managed with diet and exercise.  She does not check her blood sugar regularly and feels pretty well overall.  Headaches: Lois notes worsening migraine headaches.  She notes she had severe migraines that ultimately were due to her thyroid.  She wonders if her thyroid may be out of range and is interested in recheck.  She describes visual auras preceding splitting pounding bitemporal headache.  She has these last few hours and typically resolved.  She denies any weakness or numbness or loss of function.     Past Medical History:  Diagnosis Date  . ALLERGIC RHINITIS 04/10/2008  . Carpal tunnel syndrome of right wrist   . DEPRESSION 08/13/2008  . Diabetes mellitus without complication ([place])   . HYPERLIPIDEMIA 08/04/2006  . HYPERTENSION 08/04/2006  . HYPOTHYROIDISM 08/04/2006  . LOW BACK PAIN 08/04/2006  . Neuromuscular disorder ([place])   . PERIMENOPAUSAL SYNDROME 05/08/2008  . PVD (peripheral vascular disease) ([place])    occlusive, status post bifem bypass 2007   Past Surgical History:  Procedure Laterality Date  . CARPAL TUNNEL RELEASE Right 04/05/2016   Procedure: OPEN RIGHT CARPAL TUNNEL RELEASE;  Surgeon: Jessy Oto, MD;  Location: [facility];  Service: Orthopedics;  Laterality: Right;  . CHOLECYSTECTOMY    . FEMORAL BYPASS     bifem.  Marland Kitchen HAGLAND'S DEFORMITY EXCISION    .  TUBAL LIGATION     Social History   Tobacco Use  . Smoking status: Former Smoker    Last attempt to quit: 02/15/2004    Years since quitting: 13.2  . Smokeless tobacco: Never Used  Substance Use Topics  . Alcohol use: No   family history includes Alcohol abuse in her brother and maternal uncle; Diabetes in her maternal grandmother; Heart disease in her father; Hyperlipidemia in her father; Hypertension in her father and mother; Stroke in her maternal uncle.  ROS as above:  Medications: Current Outpatient Medications  Medication Sig Dispense Refill  . albuterol (PROVENTIL HFA;VENTOLIN HFA) 108 (90 Base) MCG/ACT inhaler Inhale 2 puffs into the lungs every 6 (six) hours as needed for wheezing or shortness of breath. 1 Inhaler 2  . albuterol (PROVENTIL) (2.5 MG/3ML) 0.083% nebulizer solution Take 3 mLs (2.5 mg total) by nebulization every 6 (six) hours as needed for wheezing or shortness of breath. 150 mL 1  . benazepril (LOTENSIN) 10 MG tablet TAKE 1 TABLET(10 MG) BY MOUTH DAILY 90 tablet 0  . diclofenac sodium (VOLTAREN) 1 % GEL Apply 2 g topically 4 (four) times daily. To affected joint. 100 g 11  . dicyclomine (BENTYL) 10 MG capsule Take 1 capsule (10 mg total) by mouth 4 (four) times daily -  before meals and at bedtime. 120 capsule 12  . fexofenadine (ALLEGRA) 180 MG tablet Take 1 tablet (180 mg total) by mouth daily. 30 tablet 12  . fluticasone furoate-vilanterol (BREO ELLIPTA) 100-25 MCG/INH AEPB Inhale into the lungs.    Marland Kitchen levothyroxine (SYNTHROID, LEVOTHROID) 100  MCG tablet Take 1 tablet (100 mcg total) by mouth daily. 90 tablet 1  . venlafaxine XR (EFFEXOR-XR) 75 MG 24 hr capsule TAKE 1 CAPSULE(75 MG) BY MOUTH DAILY WITH BREAKFAST. FOLLOW UP APPOINTMENT BEFORE MORE REFILLS 30 capsule 0   No current facility-administered medications for this visit.    Allergies  Allergen Reactions  . Gluten Meal Diarrhea  . Influenza Virus Vacc Split Pf Other (See Comments)    Swelling around  injection site  . Lipitor [Atorvastatin] Other (See Comments)    Dizziness.     Health Maintenance Health Maintenance  Topic Date Due  . OPHTHALMOLOGY EXAM  04/08/1970  . PAP SMEAR  01/17/2016  . MAMMOGRAM  02/03/2017  . COLONOSCOPY  02/14/2018 (Originally 04/08/2010)  . FOOT EXAM  08/09/2017  . HEMOGLOBIN A1C  08/24/2017  . TETANUS/TDAP  08/14/2018  . PNEUMOCOCCAL POLYSACCHARIDE VACCINE (2) 02/11/2020  . Hepatitis C Screening  Completed  . HIV Screening  Completed     Exam:  BP 124/80   Pulse 69   Ht 5' 4.02" (1.626 m)   Wt 217 lb (98.4 kg)   SpO2 97%   BMI 37.23 kg/m  Gen: Well NAD HEENT: EOMI,  MMM Lungs: Normal work of breathing. CTABL Heart: RRR no MRG Abd: NABS, Soft. Nondistended, Nontender Exts: Brisk capillary refill, warm and well perfused.  Neuro alert and oriented normal coordination balance gait sensation.   Results for orders placed or performed in visit on 05/31/17 (from the past 72 hour(s))  POCT HgB A1C     Status: None   Collection Time: 05/31/17  9:34 AM  Result Value Ref Range   Hemoglobin A1C 6.6    No results found.    Assessment and Plan: 57 y.o. female with  Mild persistent asthma: Not well controlled symptomatically.  Follow-up appointment scheduled with pulmonology in the near future.  Will follow along.  Diabetes: Doing reasonably well with diet control.  Continue low-carb diet and watchful waiting.  Headaches: Likely migraine type.  Will check TSH is thyroid has been a problem for this issue in the past.  If TSH seems normal and we do not need to adjust levothyroxine dose next step will likely be Topamax prophylaxis.    Hypothyroidism as noted above.     Orders Placed This Encounter  Procedures  . TSH  . POCT HgB A1C   No orders of the defined types were placed in this encounter.    Discussed warning signs or symptoms. Please see discharge instructions. Patient expresses understanding.

## 2017-05-31 NOTE — Patient Instructions (Signed)
Thank you for coming in today. Check thyroid.  If all is well we may consider topamax for headaches.

## 2017-06-01 ENCOUNTER — Ambulatory Visit: Payer: PRIVATE HEALTH INSURANCE | Admitting: Family Medicine

## 2017-06-09 ENCOUNTER — Other Ambulatory Visit: Payer: Self-pay | Admitting: Family Medicine

## 2017-06-09 DIAGNOSIS — I10 Essential (primary) hypertension: Secondary | ICD-10-CM

## 2017-06-18 ENCOUNTER — Other Ambulatory Visit: Payer: Self-pay | Admitting: Family Medicine

## 2017-06-23 ENCOUNTER — Other Ambulatory Visit: Payer: Self-pay | Admitting: Family Medicine

## 2017-06-24 ENCOUNTER — Other Ambulatory Visit: Payer: Self-pay | Admitting: Internal Medicine

## 2017-06-26 ENCOUNTER — Other Ambulatory Visit: Payer: Self-pay

## 2017-06-26 MED ORDER — VENLAFAXINE HCL ER 75 MG PO CP24: 75.0000 mg | ORAL_CAPSULE | Freq: Every day | ORAL | 0 refills | Status: DC

## 2017-07-23 ENCOUNTER — Other Ambulatory Visit: Payer: Self-pay | Admitting: Family Medicine

## 2017-07-24 ENCOUNTER — Other Ambulatory Visit: Payer: Self-pay | Admitting: Family Medicine

## 2017-07-26 ENCOUNTER — Encounter: Payer: Self-pay | Admitting: Family Medicine

## 2017-07-26 ENCOUNTER — Ambulatory Visit (INDEPENDENT_AMBULATORY_CARE_PROVIDER_SITE_OTHER): Payer: PRIVATE HEALTH INSURANCE | Admitting: Family Medicine

## 2017-07-26 VITALS — BP 118/69 | HR 63 | Wt 219.0 lb

## 2017-07-26 DIAGNOSIS — J4540 Moderate persistent asthma, uncomplicated: Secondary | ICD-10-CM

## 2017-07-26 DIAGNOSIS — E890 Postprocedural hypothyroidism: Secondary | ICD-10-CM | POA: Diagnosis not present

## 2017-07-26 DIAGNOSIS — R21 Rash and other nonspecific skin eruption: Secondary | ICD-10-CM | POA: Diagnosis not present

## 2017-07-26 DIAGNOSIS — R5383 Other fatigue: Secondary | ICD-10-CM | POA: Diagnosis not present

## 2017-07-26 MED ORDER — TRIAMCINOLONE ACETONIDE 0.5 % EX CREA: 1.0000 "application " | TOPICAL_CREAM | Freq: Two times a day (BID) | CUTANEOUS | 3 refills | Status: DC

## 2017-07-26 NOTE — Progress Notes (Signed)
[name] is a 57 y.o. female who presents to [place]: [place] today for rash.  [name] notes a pruritic rash at the posterior aspect of her left leg.  Symptoms started a day or 2 ago.  She cannot recall any specific inciting event.  She denies any new cosmetics of detergents or cosmetics.  She thinks perhaps she was bitten by mosquitoes.  She notes it is itchy and she is tried using some antibiotic ointment which has not helped.  She notes her only new medication is Spiriva which she is been taking for about a week now.  Asthma/COPD.  Patient was seen by pulmonology and switched off of Breo and started on Spiriva.  She notes this is helped her breathe better.  Addition patient notes mild fatigue.  She notes this been ongoing for about a week or 2.  She is not sure of any cause.  She does not she continues to take her levothyroxine and is going to get her previously ordered TSH lab today.  She wonders if her levothyroxine dose may not be quite right she is feeling fatigued.   ROS as above:  Exam:  BP 118/69   Pulse 63   Wt 219 lb (99.3 kg)   BMI 37.57 kg/m  Gen: Well NAD HEENT: EOMI,  MMM Lungs: Normal work of breathing. CTABL Heart: RRR no MRG Abd: NABS, Soft. Nondistended, Nontender Exts: Brisk capillary refill, warm and well perfused.  Skin left posterior knee with macular patch of mild erythema with occasional small erythematous papules.  Nontender.  Blanchable.  No vesicles present.  Lab and Radiology Results Lab Results  Component Value Date   TSH 0.61 02/24/2017     Assessment and Plan: 57 y.o. female with  Rash unclear etiology likely arthropod bite.  Doubtful drug reaction from Spiriva.  Plan to start treatment with triamcinolone cream and recheck if not improving.  Hypothyroidism: Check TSH and adjust levothyroxine dose appropriately.  Fatigue: Unclear  etiology likely related to hyperthyroidism.  If TSH is normal and patient continues to experience fatigue recheck in the near future.   No orders of the defined types were placed in this encounter.  Meds ordered this encounter  Medications  . triamcinolone cream (KENALOG) 0.5 %    Sig: Apply 1 application topically 2 (two) times daily. To affected areas.    Dispense:  30 g    Refill:  3     Historical information moved to improve visibility of documentation.  Past Medical History:  Diagnosis Date  . ALLERGIC RHINITIS 04/10/2008  . Carpal tunnel syndrome of right wrist   . DEPRESSION 08/13/2008  . Diabetes mellitus without complication ([place])   . HYPERLIPIDEMIA 08/04/2006  . HYPERTENSION 08/04/2006  . HYPOTHYROIDISM 08/04/2006  . LOW BACK PAIN 08/04/2006  . Neuromuscular disorder ([place])   . PERIMENOPAUSAL SYNDROME 05/08/2008  . PVD (peripheral vascular disease) ([place])    occlusive, status post bifem bypass 2007   Past Surgical History:  Procedure Laterality Date  . CARPAL TUNNEL RELEASE Right 04/05/2016   Procedure: OPEN RIGHT CARPAL TUNNEL RELEASE;  Surgeon: Jessy Oto, MD;  Location: [facility];  Service: Orthopedics;  Laterality: Right;  . CHOLECYSTECTOMY    . FEMORAL BYPASS     bifem.  Marland Kitchen HAGLAND'S DEFORMITY EXCISION    . TUBAL LIGATION     Social History   Tobacco Use  . Smoking status: Former Smoker  Last attempt to quit: 02/15/2004    Years since quitting: 13.4  . Smokeless tobacco: Never Used  Substance Use Topics  . Alcohol use: No   family history includes Alcohol abuse in her brother and maternal uncle; Diabetes in her maternal grandmother; Heart disease in her father; Hyperlipidemia in her father; Hypertension in her father and mother; Stroke in her maternal uncle.  Medications: Current Outpatient Medications  Medication Sig Dispense Refill  . albuterol (PROVENTIL HFA;VENTOLIN HFA) 108 (90 Base) MCG/ACT inhaler INHALE 2 PUFFS INTO THE LUNGS  EVERY 6 HOURS AS NEEDED FOR WHEEZING OR SHORTNESS OF BREATH 18 g 0  . albuterol (PROVENTIL) (2.5 MG/3ML) 0.083% nebulizer solution Take 3 mLs (2.5 mg total) by nebulization every 6 (six) hours as needed for wheezing or shortness of breath. 150 mL 1  . azelastine (ASTELIN) 0.1 % nasal spray Place into the nose.    . benazepril (LOTENSIN) 10 MG tablet TAKE 1 TABLET(10 MG) BY MOUTH DAILY 90 tablet 0  . diclofenac sodium (VOLTAREN) 1 % GEL Apply 2 g topically 4 (four) times daily. To affected joint. 100 g 11  . dicyclomine (BENTYL) 10 MG capsule Take 1 capsule (10 mg total) by mouth 4 (four) times daily -  before meals and at bedtime. 120 capsule 12  . fexofenadine (ALLEGRA) 180 MG tablet Take 1 tablet (180 mg total) by mouth daily. 30 tablet 12  . levothyroxine (SYNTHROID, LEVOTHROID) 100 MCG tablet Take 1 tablet (100 mcg total) by mouth daily. 90 tablet 1  . omeprazole (PRILOSEC) 40 MG capsule Take by mouth.    . tiotropium (SPIRIVA HANDIHALER) 18 MCG inhalation capsule Place 1 Inhaler into inhaler and inhale daily.    Marland Kitchen venlafaxine XR (EFFEXOR-XR) 75 MG 24 hr capsule TAKE 1 CAPSULE(75 MG) BY MOUTH DAILY WITH BREAKFAST 90 capsule 0  . metoprolol tartrate (LOPRESSOR) 50 MG tablet Take 1 tablet by mouth 2 (two) times daily.  3  . triamcinolone (NASACORT) 55 MCG/ACT AERO nasal inhaler U 1 SPRAY IEN QPM  12  . triamcinolone cream (KENALOG) 0.5 % Apply 1 application topically 2 (two) times daily. To affected areas. 30 g 3   No current facility-administered medications for this visit.    Allergies  Allergen Reactions  . Gluten Meal Diarrhea  . Influenza Virus Vacc Split Pf Other (See Comments)    Swelling around injection site  . Lipitor [Atorvastatin] Other (See Comments)    Dizziness.     Health Maintenance Health Maintenance  Topic Date Due  . OPHTHALMOLOGY EXAM  04/08/1970  . PAP SMEAR  01/17/2016  . MAMMOGRAM  02/03/2017  . COLONOSCOPY  02/14/2018 (Originally 04/08/2010)  . FOOT EXAM   08/09/2017  . HEMOGLOBIN A1C  11/30/2017  . TETANUS/TDAP  08/14/2018  . PNEUMOCOCCAL POLYSACCHARIDE VACCINE (2) 02/11/2020  . Hepatitis C Screening  Completed  . HIV Screening  Completed    Discussed warning signs or symptoms. Please see discharge instructions. Patient expresses understanding.

## 2017-07-26 NOTE — Patient Instructions (Signed)
Thank you for coming in today. Get your thyroid lab now.  Use Triamolone cream

## 2017-07-27 ENCOUNTER — Other Ambulatory Visit: Payer: Self-pay | Admitting: Family Medicine

## 2017-07-27 LAB — TSH: TSH: 0.15 mIU/L — ABNORMAL LOW (ref 0.40–4.50)

## 2017-07-27 MED ORDER — LEVOTHYROXINE SODIUM 88 MCG PO TABS: 88.0000 ug | ORAL_TABLET | Freq: Every day | ORAL | 0 refills | Status: DC

## 2017-08-05 ENCOUNTER — Emergency Department (INDEPENDENT_AMBULATORY_CARE_PROVIDER_SITE_OTHER): Payer: PRIVATE HEALTH INSURANCE

## 2017-08-05 ENCOUNTER — Other Ambulatory Visit: Payer: Self-pay

## 2017-08-05 ENCOUNTER — Encounter: Payer: Self-pay | Admitting: Emergency Medicine

## 2017-08-05 ENCOUNTER — Emergency Department (INDEPENDENT_AMBULATORY_CARE_PROVIDER_SITE_OTHER)
Admission: EM | Admit: 2017-08-05 | Discharge: 2017-08-05 | Disposition: A | Discharge status: Home / Self Care | Payer: PRIVATE HEALTH INSURANCE | Source: Home / Self Care | Attending: Family Medicine | Admitting: Family Medicine

## 2017-08-05 DIAGNOSIS — M25571 Pain in right ankle and joints of right foot: Secondary | ICD-10-CM

## 2017-08-05 DIAGNOSIS — L03115 Cellulitis of right lower limb: Secondary | ICD-10-CM | POA: Diagnosis not present

## 2017-08-05 DIAGNOSIS — M76821 Posterior tibial tendinitis, right leg: Secondary | ICD-10-CM | POA: Diagnosis not present

## 2017-08-05 MED ORDER — DOXYCYCLINE HYCLATE 100 MG PO CAPS: 100.0000 mg | ORAL_CAPSULE | Freq: Two times a day (BID) | ORAL | 0 refills | Status: DC

## 2017-08-05 NOTE — ED Triage Notes (Signed)
57 y.o female presents c/o pain to medial aspect of right ankle. States she scrapped it on a storm drain after sliding down a hill one week ago at work. She states she's keeping the area clean and dry and placing neosporn.

## 2017-08-05 NOTE — Discharge Instructions (Addendum)
Wear ace wrap daytime. May take Ibuprofen 272m, 4 tabs every 8 hours with food.  Stop azithromycin while taking doxycycline. Begin range of motion and stretching exercises as tolerated.

## 2017-08-05 NOTE — ED Provider Notes (Signed)
[name]    CSN: [id number] Arrival date & time: 08/05/17  1239     History   Chief Complaint Chief Complaint  Patient presents with  . Foot Pain    right medial aspect of ankle abraision    HPI [name] is a 57 y.o. female.   About 8 days ago patient slipped on a bank and slid down, landing at the edge of a storm drain.  While she was standing she scraped her right medial ankle on the steel grate and also twisted her ankle.  The abrasion on her ankle has been healing, but her medial ankle remains painful when walking. Patient is taking azithromycin for her chronic asthmatic bronchitis.  The history is provided by the patient.  Ankle Pain  Location:  Ankle Time since incident:  8 days Injury: yes   Mechanism of injury: fall   Fall:    Fall occurred: down a bank.   Impact surface: metal storm drain.   Point of impact: right ankle.   Entrapped after fall: no   Ankle location:  R ankle Pain details:    Quality:  Aching   Radiates to:  Does not radiate   Severity:  Moderate   Onset quality:  Sudden   Duration:  8 days   Timing:  Constant   Progression:  Improving Chronicity:  New Dislocation: no   Foreign body present:  No foreign bodies Prior injury to area:  No Relieved by:  Nothing Worsened by:  Bearing weight Ineffective treatments: Neosporing ointment. Associated symptoms: decreased ROM, stiffness and swelling   Associated symptoms: no fever, no muscle weakness, no numbness and no tingling   Risk factors: obesity     Past Medical History:  Diagnosis Date  . ALLERGIC RHINITIS 04/10/2008  . Carpal tunnel syndrome of right wrist   . DEPRESSION 08/13/2008  . Diabetes mellitus without complication ([place])   . HYPERLIPIDEMIA 08/04/2006  . HYPERTENSION 08/04/2006  . HYPOTHYROIDISM 08/04/2006  . LOW BACK PAIN 08/04/2006  . Neuromuscular disorder ([place])   . PERIMENOPAUSAL SYNDROME 05/08/2008  . PVD (peripheral vascular disease) ([place])    occlusive,  status post bifem bypass 2007    Patient Active Problem List   Diagnosis Date Noted  . Migraine with aura and without status migrainosus, not intractable 05/31/2017  . Plantar fasciitis, right 04/12/2017  . Asthma 04/12/2017  . SOB (shortness of breath) 07/12/2016  . Plantar fasciitis, left 11/17/2015  . Fatigue 11/17/2015  . 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  . Type 2 diabetes mellitus with vascular disease ([place]) 01/14/2015  . Vitamin D deficiency 01/14/2015  . Obesity 01/13/2015  . Fatty liver disease, nonalcoholic 60/60/0459  . Peripheral vascular disease ([place]) 07/15/2011  . DEPRESSION 08/13/2008  . PERIMENOPAUSAL SYNDROME 05/08/2008  . ALLERGIC RHINITIS 04/10/2008  . Hypothyroidism 08/04/2006  . Dyslipidemia 08/04/2006  . Essential hypertension 08/04/2006    Past Surgical History:  Procedure Laterality Date  . CARPAL TUNNEL RELEASE Right 04/05/2016   Procedure: OPEN RIGHT CARPAL TUNNEL RELEASE;  Surgeon: Jessy Oto, MD;  Location: [facility];  Service: Orthopedics;  Laterality: Right;  . CHOLECYSTECTOMY    . FEMORAL BYPASS     bifem.  Marland Kitchen HAGLAND'S DEFORMITY EXCISION    . TUBAL LIGATION      OB History   None      Home Medications    Prior to Admission medications   Medication Sig Start Date End Date Taking? Authorizing Provider  azithromycin [facility])  250 MG tablet Take by mouth daily.   Yes [provider]  albuterol (PROVENTIL HFA;VENTOLIN HFA) 108 (90 Base) MCG/ACT inhaler INHALE 2 PUFFS INTO THE LUNGS EVERY 6 HOURS AS NEEDED FOR WHEEZING OR SHORTNESS OF BREATH 06/19/17   Gregor Hams, MD  albuterol (PROVENTIL) (2.5 MG/3ML) 0.083% nebulizer solution Take 3 mLs (2.5 mg total) by nebulization every 6 (six) hours as needed for wheezing or shortness of breath. 04/20/17   Gregor Hams, MD  azelastine (ASTELIN) 0.1 % nasal spray Place into the nose. 12/16/16   [provider]  benazepril (LOTENSIN) 10 MG tablet TAKE 1  TABLET(10 MG) BY MOUTH DAILY 06/09/17   Gregor Hams, MD  diclofenac sodium (VOLTAREN) 1 % GEL Apply 2 g topically 4 (four) times daily. To affected joint. 01/13/15   Gregor Hams, MD  dicyclomine (BENTYL) 10 MG capsule Take 1 capsule (10 mg total) by mouth 4 (four) times daily -  before meals and at bedtime. 09/26/16   Gregor Hams, MD  doxycycline (VIBRAMYCIN) 100 MG capsule Take 1 capsule (100 mg total) by mouth 2 (two) times daily. Take with food. 08/05/17   Kandra Nicolas, MD  fexofenadine (ALLEGRA) 180 MG tablet Take 1 tablet (180 mg total) by mouth daily. 01/13/15   Gregor Hams, MD  levothyroxine (SYNTHROID, LEVOTHROID) 88 MCG tablet Take 1 tablet (88 mcg total) by mouth daily. 07/27/17   Gregor Hams, MD  metoprolol tartrate (LOPRESSOR) 50 MG tablet Take 1 tablet by mouth 2 (two) times daily. 06/23/17   [provider]  omeprazole (PRILOSEC) 40 MG capsule Take by mouth. 06/15/17 06/15/18  [provider]  tiotropium (SPIRIVA HANDIHALER) 18 MCG inhalation capsule Place 1 Inhaler into inhaler and inhale daily. 07/18/17 07/18/18  [provider]  triamcinolone (NASACORT) 55 MCG/ACT AERO nasal inhaler U 1 SPRAY IEN QPM 07/18/17   [provider]  triamcinolone cream (KENALOG) 0.5 % Apply 1 application topically 2 (two) times daily. To affected areas. 07/26/17   Gregor Hams, MD  venlafaxine XR (EFFEXOR-XR) 75 MG 24 hr capsule TAKE 1 CAPSULE(75 MG) BY MOUTH DAILY WITH BREAKFAST 07/24/17   Gregor Hams, MD    Family History Family History  Problem Relation Age of Onset  . Hypertension Mother   . Heart disease Father   . Hyperlipidemia Father   . Hypertension Father   . Alcohol abuse Brother   . Diabetes Maternal Grandmother   . Alcohol abuse Maternal Uncle   . Stroke Maternal Uncle     Social History Social History   Tobacco Use  . Smoking status: Former Smoker    Last attempt to quit: 02/15/2004    Years since quitting: 13.4  . Smokeless tobacco: Never  Used  Substance Use Topics  . Alcohol use: No  . Drug use: No     Allergies   Gluten meal; Influenza virus vacc split pf; and Lipitor [atorvastatin]   Review of Systems Review of Systems  Constitutional: Negative for fever.  Musculoskeletal: Positive for stiffness.  All other systems reviewed and are negative.    Physical Exam Triage Vital Signs ED Triage Vitals [08/05/17 1317]  Enc Vitals Group     BP 126/72     Pulse Rate 77     Resp      Temp 98.5 F (36.9 C)     Temp Source Oral     SpO2 99 %     Weight 219 lb (99.3 kg)  Height 5' 4"  (1.626 m)     Head Circumference      Peak Flow      Pain Score 5     Pain Loc      Pain Edu?      Excl. in [place]?    No data found.  Updated Vital Signs BP 126/72 (BP Location: Left Arm)   Pulse 77   Temp 98.5 F (36.9 C) (Oral)   Ht 5' 4"  (1.626 m)   Wt 219 lb (99.3 kg)   SpO2 99%   BMI 37.59 kg/m   Visual Acuity Right Eye Distance:   Left Eye Distance:   Bilateral Distance:    Right Eye Near:   Left Eye Near:    Bilateral Near:     Physical Exam  Constitutional: She appears well-developed and well-nourished. No distress.  HENT:  Head: Atraumatic.  Eyes: Pupils are equal, round, and reactive to light.  Cardiovascular: Normal rate.  Pulmonary/Chest: Effort normal.  Musculoskeletal: She exhibits no edema.       Feet:  Healing abrasion right medial malleolus.  There is eschar present with mild surrounding erythema but no drainage from the wound.  Tenderness over medial malleolus.  There is distinct tenderness to palpation over posterior tibial tendon extending into arch.  Pain elicited by resisted plantar flexion and resisted inversion of the ankle.   Neurological: She is alert.  Skin: Skin is warm and dry.  Nursing note and vitals reviewed.    UC Treatments / Results  Labs (all labs ordered are listed, but only abnormal results are displayed) Labs Reviewed - No data to  display  EKG None  Radiology Dg Ankle Complete Right  Result Date: 08/05/2017 CLINICAL DATA:  57 y/o F; twisted ankle 1 week ago with persistent pain to the lateral side. EXAM: RIGHT ANKLE - COMPLETE 3+ VIEW COMPARISON:  None. FINDINGS: There is no evidence of fracture, dislocation, or joint effusion. There is no evidence of arthropathy or other focal bone abnormality. Small dorsal calcaneal enthesophyte. IMPRESSION: No acute fracture or dislocation identified. Electronically Signed   By: Kristine Garbe M.D.   On: 08/05/2017 14:44    Procedures Procedures (including critical care time)  Medications Ordered in UC Medications - No data to display  Initial Impression / Assessment and Plan / UC Course  I have reviewed the triage vital signs and the nursing notes.  Pertinent labs & imaging results that were available during my care of the patient were reviewed by me and considered in my medical decision making (see chart for details).    Ace wrap applied. Begin doxycycline for staph coverage. Followup with Dr. Aundria Mems or Dr. Lynne Leader ([facility]) if not improving about two weeks.    Final Clinical Impressions(s) / UC Diagnoses   Final diagnoses:  Cellulitis of right ankle  Posterior tibial tendonitis of right leg     Discharge Instructions     Wear ace wrap daytime. May take Ibuprofen 233m, 4 tabs every 8 hours with food.  Stop azithromycin while taking doxycycline. Begin range of motion and stretching exercises as tolerated.    ED Prescriptions    Medication Sig Dispense Auth. Provider   doxycycline (VIBRAMYCIN) 100 MG capsule Take 1 capsule (100 mg total) by mouth 2 (two) times daily. Take with food. 14 capsule BKandra Nicolas MD        BKandra Nicolas MD 08/07/17 [phone]

## 2017-08-29 ENCOUNTER — Encounter: Payer: Self-pay | Admitting: Family Medicine

## 2017-08-29 ENCOUNTER — Ambulatory Visit (INDEPENDENT_AMBULATORY_CARE_PROVIDER_SITE_OTHER): Payer: PRIVATE HEALTH INSURANCE | Admitting: Family Medicine

## 2017-08-29 VITALS — BP 121/78 | HR 73 | Ht 64.0 in | Wt 216.0 lb

## 2017-08-29 DIAGNOSIS — J4540 Moderate persistent asthma, uncomplicated: Secondary | ICD-10-CM | POA: Diagnosis not present

## 2017-08-29 DIAGNOSIS — E785 Hyperlipidemia, unspecified: Secondary | ICD-10-CM

## 2017-08-29 DIAGNOSIS — G43109 Migraine with aura, not intractable, without status migrainosus: Secondary | ICD-10-CM | POA: Diagnosis not present

## 2017-08-29 DIAGNOSIS — I10 Essential (primary) hypertension: Secondary | ICD-10-CM

## 2017-08-29 DIAGNOSIS — E890 Postprocedural hypothyroidism: Secondary | ICD-10-CM

## 2017-08-29 DIAGNOSIS — E1159 Type 2 diabetes mellitus with other circulatory complications: Secondary | ICD-10-CM | POA: Diagnosis not present

## 2017-08-29 DIAGNOSIS — I739 Peripheral vascular disease, unspecified: Secondary | ICD-10-CM

## 2017-08-29 MED ORDER — ASPIRIN EC 81 MG PO TBEC: 81.0000 mg | DELAYED_RELEASE_TABLET | Freq: Every day | ORAL | 3 refills | Status: DC

## 2017-08-29 NOTE — Patient Instructions (Addendum)
Thank you for coming in today. Get labs fasting in the near future.   For visual aura consider taking baby aspirin.  Do not take the aspirin 1 week prior to surgery.   Good luck with the oral surgery.  Let me know who the surgeon is and if I need to send a letter or a form or notes or labs.   Work on the heel lift in the shoe. Your right leg is about 1/2 inch longer than the left.   Do the exercises. Heel raises remember to go down slowly.  Recheck in 4 months or sooner if needed.

## 2017-08-29 NOTE — Progress Notes (Signed)
[name] is a 57 y.o. female who presents to [place]: [place] today for preoperative evaluation prior to dental extraction, diabetes, thyroid disease, lipids.   [name] is planning on having multiple lower tooth extractions in the next 30-60 days. She would like to have a recheck prior to surgery for safety. She notes that her asthma is doing well. She has a recent evaluation with spirometry at [facility]. She feels well with no wheezing or SOB. She is using her albuterol every week or so. She denies any fever or chills. She notes that she is able to walk up 2 flights of stairs with no issues.   She notes that her diabetes is doing well. She is taking the medication below. She does not experience hyper or hypoglycemic episodes.  She notes her blood sugars typically pretty well controlled.  Thyroid: Patient takes levothyroxine listed below.  She denies feeling too hot or too cold.  Her dose was recently adjusted.  She feels better with the new orders.  Lipids: Patient is due for lipid panel rechecked.  Has had trouble with statins in the past.  Not currently on aspirin or statins.  History of coronary artery disease.    ROS as above:  Exam:  BP 121/78   Pulse 73   Ht 5' 4"  (1.626 m)   Wt 216 lb (98 kg)   BMI 37.08 kg/m   Wt Readings from Last 5 Encounters:  08/29/17 216 lb (98 kg)  08/05/17 219 lb (99.3 kg)  07/26/17 219 lb (99.3 kg)  05/31/17 217 lb (98.4 kg)  04/20/17 217 lb (98.4 kg)    Gen: Well NAD HEENT: EOMI,  MMM or dentition mandible Lungs: Normal work of breathing. CTABL Heart: RRR no MRG Abd: NABS, Soft. Nondistended, Nontender Exts: Brisk capillary refill, warm and well perfused.  Diabetic foot exam normal bilaterally   Lab and Radiology Results EKG and spirometry notes reviewed Recent labs reviewed   Assessment and Plan: 57 y.o. female  with  Risk evaluation for surgery.  Liver surgery.  Patient is currently well risk optimized.  Asthma is well controlled.  Continue current regimens.  Check basic labs listed below.  Asthma doing well.  Comanagement with pulmonology at [facility].  Diabetes: Doing quite well.  Due for A1c recheck.  Lipids and CVD risk: Recheck lipid panel.  Recommend baby aspirin.  Aspirin should also help reduce stroke risk given migraines with auras.  Hypothyroidism: Recheck TSH.   Orders Placed This Encounter  Procedures  . CBC  . COMPLETE METABOLIC PANEL WITH GFR  . TSH  . Hemoglobin A1c  . Lipid Panel w/reflex Direct LDL   Meds ordered this encounter  Medications  . aspirin EC 81 MG tablet    Sig: Take 1 tablet (81 mg total) by mouth daily.    Dispense:  90 tablet    Refill:  3     Historical information moved to improve visibility of documentation.  Past Medical History:  Diagnosis Date  . ALLERGIC RHINITIS 04/10/2008  . Carpal tunnel syndrome of right wrist   . DEPRESSION 08/13/2008  . Diabetes mellitus without complication ([place])   . HYPERLIPIDEMIA 08/04/2006  . HYPERTENSION 08/04/2006  . HYPOTHYROIDISM 08/04/2006  . LOW BACK PAIN 08/04/2006  . Neuromuscular disorder ([place])   . PERIMENOPAUSAL SYNDROME 05/08/2008  . PVD (peripheral vascular disease) ([place])    occlusive, status post bifem bypass 2007   Past Surgical History:  Procedure Laterality Date  . CARPAL TUNNEL RELEASE Right 04/05/2016   Procedure: OPEN RIGHT CARPAL TUNNEL RELEASE;  Surgeon: Jessy Oto, MD;  Location: [place];  Service: Orthopedics;  Laterality: Right;  . CHOLECYSTECTOMY    . FEMORAL BYPASS     bifem.  Marland Kitchen HAGLAND'S DEFORMITY EXCISION    . TUBAL LIGATION     Social History   Tobacco Use  . Smoking status: Former Smoker    Last attempt to quit: 02/15/2004    Years since quitting: 13.5  . Smokeless tobacco: Never Used  Substance Use Topics  . Alcohol use: No   family history includes Alcohol  abuse in her brother and maternal uncle; Diabetes in her maternal grandmother; Heart disease in her father; Hyperlipidemia in her father; Hypertension in her father and mother; Stroke in her maternal uncle.  Medications: Current Outpatient Medications  Medication Sig Dispense Refill  . albuterol (PROVENTIL HFA;VENTOLIN HFA) 108 (90 Base) MCG/ACT inhaler INHALE 2 PUFFS INTO THE LUNGS EVERY 6 HOURS AS NEEDED FOR WHEEZING OR SHORTNESS OF BREATH 18 g 0  . albuterol (PROVENTIL) (2.5 MG/3ML) 0.083% nebulizer solution Take 3 mLs (2.5 mg total) by nebulization every 6 (six) hours as needed for wheezing or shortness of breath. 150 mL 1  . azelastine (ASTELIN) 0.1 % nasal spray Place into the nose.    Marland Kitchen azithromycin (ZITHROMAX) 250 MG tablet Take by mouth daily.    . benazepril (LOTENSIN) 10 MG tablet TAKE 1 TABLET(10 MG) BY MOUTH DAILY 90 tablet 0  . diclofenac sodium (VOLTAREN) 1 % GEL Apply 2 g topically 4 (four) times daily. To affected joint. 100 g 11  . dicyclomine (BENTYL) 10 MG capsule Take 1 capsule (10 mg total) by mouth 4 (four) times daily -  before meals and at bedtime. 120 capsule 12  . fexofenadine (ALLEGRA) 180 MG tablet Take 1 tablet (180 mg total) by mouth daily. 30 tablet 12  . levothyroxine (SYNTHROID, LEVOTHROID) 88 MCG tablet Take 1 tablet (88 mcg total) by mouth daily. 90 tablet 0  . metoprolol tartrate (LOPRESSOR) 50 MG tablet Take 1 tablet by mouth 2 (two) times daily.  3  . omeprazole (PRILOSEC) 40 MG capsule Take by mouth.    . tiotropium (SPIRIVA HANDIHALER) 18 MCG inhalation capsule Place 1 Inhaler into inhaler and inhale daily.    Marland Kitchen triamcinolone (NASACORT) 55 MCG/ACT AERO nasal inhaler U 1 SPRAY IEN QPM  12  . triamcinolone cream (KENALOG) 0.5 % Apply 1 application topically 2 (two) times daily. To affected areas. 30 g 3  . venlafaxine XR (EFFEXOR-XR) 75 MG 24 hr capsule TAKE 1 CAPSULE(75 MG) BY MOUTH DAILY WITH BREAKFAST 90 capsule 0  . aspirin EC 81 MG tablet Take 1 tablet  (81 mg total) by mouth daily. 90 tablet 3   No current facility-administered medications for this visit.    Allergies  Allergen Reactions  . Gluten Meal Diarrhea  . Influenza Virus Vacc Split Pf Other (See Comments)    Swelling around injection site  . Lipitor [Atorvastatin] Other (See Comments)    Dizziness.      Discussed warning signs or symptoms. Please see discharge instructions. Patient expresses understanding.

## 2017-09-05 ENCOUNTER — Other Ambulatory Visit: Payer: Self-pay | Admitting: Family Medicine

## 2017-09-05 DIAGNOSIS — I10 Essential (primary) hypertension: Secondary | ICD-10-CM

## 2017-10-21 ENCOUNTER — Other Ambulatory Visit: Payer: Self-pay | Admitting: Family Medicine

## 2017-10-22 ENCOUNTER — Other Ambulatory Visit: Payer: Self-pay | Admitting: Family Medicine

## 2017-10-24 ENCOUNTER — Encounter: Payer: Self-pay | Admitting: Family Medicine

## 2017-10-24 ENCOUNTER — Telehealth: Payer: Self-pay | Admitting: Family Medicine

## 2017-10-24 ENCOUNTER — Ambulatory Visit (INDEPENDENT_AMBULATORY_CARE_PROVIDER_SITE_OTHER): Payer: PRIVATE HEALTH INSURANCE | Admitting: Family Medicine

## 2017-10-24 VITALS — BP 110/75 | HR 58 | Temp 98.0°F | Ht 64.0 in | Wt 218.0 lb

## 2017-10-24 DIAGNOSIS — J0100 Acute maxillary sinusitis, unspecified: Secondary | ICD-10-CM | POA: Diagnosis not present

## 2017-10-24 DIAGNOSIS — J4540 Moderate persistent asthma, uncomplicated: Secondary | ICD-10-CM | POA: Diagnosis not present

## 2017-10-24 NOTE — Patient Instructions (Signed)
Thank you for coming in today. Continue current treatment.  We can do a longer course of prednisone if needed.  We can also do different antibiotics  I recommend continuing brio at 200.  Let me know what pulmonology says.

## 2017-10-24 NOTE — Telephone Encounter (Signed)
-----   Message from Gregor Hams, MD sent at 10/24/2017 10:08 AM EDT ----- Regarding: Shingrix Please add Manuela Schwartz to [employer]

## 2017-10-24 NOTE — Telephone Encounter (Signed)
Added

## 2017-10-24 NOTE — Progress Notes (Signed)
[name] is a 57 y.o. female who presents to [place]: [place] today for cough congestion runny nose sinus pressure.  Symptoms present for a few weeks now.  Patient has been in contact with her pulmonologist and she is received a 14-day prednisone Dosepak which ended a few weeks ago.  Additionally her Memory Dance was increased.  She notes that she is slowly improving but a few days ago developed sinus pain and pressure.  She made the appointment she was concerned for sinusitis however notes that her symptoms are improving now.  She is frustrated that she continues to have cough congestion and runny nose and suspects that her asthma is not well controlled.  She notes that her symptoms are typically much worse at work than they are at home and is worried that she may be allergic to her work environment.  She is attempting to be able to work from home.   ROS as above:  Exam:  BP 110/75   Pulse (!) 58   Temp 98 F (36.7 C)   Ht 5' 4"  (1.626 m)   Wt 218 lb (98.9 kg)   BMI 37.42 kg/m  Wt Readings from Last 5 Encounters:  10/24/17 218 lb (98.9 kg)  08/29/17 216 lb (98 kg)  08/05/17 219 lb (99.3 kg)  07/26/17 219 lb (99.3 kg)  05/31/17 217 lb (98.4 kg)    Gen: Well NAD HEENT: EOMI,  MMM clear nasal discharge.  Inflamed nasal turbinates bilaterally.  Normal posterior pharynx.  No cervical lymphadenopathy.  Mildly tender palpation bilateral maxillary sinuses. Lungs: Normal work of breathing. CTABL Heart: RRR no MRG Abd: NABS, Soft. Nondistended, Nontender Exts: Brisk capillary refill, warm and well perfused.      Assessment and Plan: 57 y.o. female with  Sinusitis: Likely viral.  Plan for watchful waiting.  We will proceed with antibiotic and prednisone if needed in the future if worsening.  Asthma: Reasonably well controlled.  Continue current regimen.  Continue higher dose  Breo.  Follow-up with pulmonology or me in the future if needed.   No orders of the defined types were placed in this encounter.  No orders of the defined types were placed in this encounter.    Historical information moved to improve visibility of documentation.  Past Medical History:  Diagnosis Date  . ALLERGIC RHINITIS 04/10/2008  . Carpal tunnel syndrome of right wrist   . DEPRESSION 08/13/2008  . Diabetes mellitus without complication ([place])   . HYPERLIPIDEMIA 08/04/2006  . HYPERTENSION 08/04/2006  . HYPOTHYROIDISM 08/04/2006  . LOW BACK PAIN 08/04/2006  . Neuromuscular disorder ([place])   . PERIMENOPAUSAL SYNDROME 05/08/2008  . PVD (peripheral vascular disease) ([place])    occlusive, status post bifem bypass 2007   Past Surgical History:  Procedure Laterality Date  . CARPAL TUNNEL RELEASE Right 04/05/2016   Procedure: OPEN RIGHT CARPAL TUNNEL RELEASE;  Surgeon: Jessy Oto, MD;  Location: [facility];  Service: Orthopedics;  Laterality: Right;  . CHOLECYSTECTOMY    . FEMORAL BYPASS     bifem.  Marland Kitchen HAGLAND'S DEFORMITY EXCISION    . TUBAL LIGATION     Social History   Tobacco Use  . Smoking status: Former Smoker    Last attempt to quit: 02/15/2004    Years since quitting: 13.6  . Smokeless tobacco: Never Used  Substance Use Topics  . Alcohol use: No   family history includes Alcohol abuse in her  brother and maternal uncle; Diabetes in her maternal grandmother; Heart disease in her father; Hyperlipidemia in her father; Hypertension in her father and mother; Stroke in her maternal uncle.  Medications: Current Outpatient Medications  Medication Sig Dispense Refill  . albuterol (PROVENTIL HFA;VENTOLIN HFA) 108 (90 Base) MCG/ACT inhaler INHALE 2 PUFFS INTO THE LUNGS EVERY 6 HOURS AS NEEDED FOR WHEEZING OR SHORTNESS OF BREATH 18 g 0  . albuterol (PROVENTIL) (2.5 MG/3ML) 0.083% nebulizer solution Take 3 mLs (2.5 mg total) by nebulization every 6 (six) hours as needed for  wheezing or shortness of breath. 150 mL 1  . aspirin EC 81 MG tablet Take 1 tablet (81 mg total) by mouth daily. 90 tablet 3  . azelastine (ASTELIN) 0.1 % nasal spray Place into the nose.    . benazepril (LOTENSIN) 10 MG tablet TAKE 1 TABLET(10 MG) BY MOUTH DAILY 90 tablet 0  . diclofenac sodium (VOLTAREN) 1 % GEL Apply 2 g topically 4 (four) times daily. To affected joint. 100 g 11  . dicyclomine (BENTYL) 10 MG capsule Take 1 capsule (10 mg total) by mouth 4 (four) times daily -  before meals and at bedtime. 120 capsule 12  . fexofenadine (ALLEGRA) 180 MG tablet Take 1 tablet (180 mg total) by mouth daily. 30 tablet 12  . levothyroxine (SYNTHROID, LEVOTHROID) 88 MCG tablet TAKE 1 TABLET(88 MCG) BY MOUTH DAILY 90 tablet 0  . metoprolol tartrate (LOPRESSOR) 50 MG tablet Take 1 tablet by mouth 2 (two) times daily.  3  . omeprazole (PRILOSEC) 40 MG capsule Take by mouth.    . tiotropium (SPIRIVA HANDIHALER) 18 MCG inhalation capsule Place 1 Inhaler into inhaler and inhale daily.    Marland Kitchen triamcinolone (NASACORT) 55 MCG/ACT AERO nasal inhaler U 1 SPRAY IEN QPM  12  . triamcinolone cream (KENALOG) 0.5 % Apply 1 application topically 2 (two) times daily. To affected areas. 30 g 3  . venlafaxine XR (EFFEXOR-XR) 75 MG 24 hr capsule TAKE 1 CAPSULE(75 MG) BY MOUTH DAILY WITH BREAKFAST 90 capsule 0  . atorvastatin (LIPITOR) 40 MG tablet Take by mouth.    Marland Kitchen BREO ELLIPTA 200-25 MCG/INH AEPB INL 1 PUFF ITL QD  11   No current facility-administered medications for this visit.    Allergies  Allergen Reactions  . Gluten Meal Diarrhea  . Influenza Virus Vacc Split Pf Other (See Comments)    Swelling around injection site  . Lipitor [Atorvastatin] Other (See Comments)    Dizziness.      Discussed warning signs or symptoms. Please see discharge instructions. Patient expresses understanding.

## 2017-11-20 ENCOUNTER — Ambulatory Visit (INDEPENDENT_AMBULATORY_CARE_PROVIDER_SITE_OTHER): Payer: PRIVATE HEALTH INSURANCE | Admitting: Family Medicine

## 2017-11-20 VITALS — BP 139/78 | HR 66 | Temp 97.9°F

## 2017-11-20 DIAGNOSIS — Z23 Encounter for immunization: Secondary | ICD-10-CM | POA: Diagnosis not present

## 2017-11-20 NOTE — Progress Notes (Signed)
[name] presents to the clinic for a shingles injection.  Pt tolerated injection well in the left deltoid without complications. -EH/RMA

## 2017-12-07 ENCOUNTER — Other Ambulatory Visit: Payer: Self-pay | Admitting: Family Medicine

## 2017-12-07 DIAGNOSIS — I10 Essential (primary) hypertension: Secondary | ICD-10-CM

## 2017-12-08 ENCOUNTER — Other Ambulatory Visit: Payer: Self-pay

## 2017-12-08 MED ORDER — METOPROLOL TARTRATE 50 MG PO TABS: 50.0000 mg | ORAL_TABLET | Freq: Two times a day (BID) | ORAL | 3 refills | Status: DC

## 2017-12-15 ENCOUNTER — Other Ambulatory Visit: Payer: Self-pay | Admitting: Family Medicine

## 2017-12-22 ENCOUNTER — Encounter: Payer: Self-pay | Admitting: Family Medicine

## 2017-12-22 ENCOUNTER — Ambulatory Visit (INDEPENDENT_AMBULATORY_CARE_PROVIDER_SITE_OTHER): Payer: PRIVATE HEALTH INSURANCE | Admitting: Family Medicine

## 2017-12-22 VITALS — BP 149/82 | HR 83 | Ht 64.0 in | Wt 219.0 lb

## 2017-12-22 DIAGNOSIS — J4540 Moderate persistent asthma, uncomplicated: Secondary | ICD-10-CM

## 2017-12-22 DIAGNOSIS — E890 Postprocedural hypothyroidism: Secondary | ICD-10-CM | POA: Diagnosis not present

## 2017-12-22 DIAGNOSIS — R0683 Snoring: Secondary | ICD-10-CM

## 2017-12-22 DIAGNOSIS — I10 Essential (primary) hypertension: Secondary | ICD-10-CM

## 2017-12-22 DIAGNOSIS — R5383 Other fatigue: Secondary | ICD-10-CM

## 2017-12-22 DIAGNOSIS — G43109 Migraine with aura, not intractable, without status migrainosus: Secondary | ICD-10-CM | POA: Diagnosis not present

## 2017-12-22 DIAGNOSIS — E785 Hyperlipidemia, unspecified: Secondary | ICD-10-CM

## 2017-12-22 DIAGNOSIS — M722 Plantar fascial fibromatosis: Secondary | ICD-10-CM

## 2017-12-22 DIAGNOSIS — Z124 Encounter for screening for malignant neoplasm of cervix: Secondary | ICD-10-CM

## 2017-12-22 DIAGNOSIS — E1159 Type 2 diabetes mellitus with other circulatory complications: Secondary | ICD-10-CM

## 2017-12-22 LAB — POCT GLYCOSYLATED HEMOGLOBIN (HGB A1C): Hemoglobin A1C: 6.8 % — AB (ref 4.0–5.6)

## 2017-12-22 NOTE — Patient Instructions (Addendum)
Thank you for coming in today.  Hapad Heel Lifts.  Width 2-1/2 height left 1/2 right 1/4 or so.   [email or web address]   Diabetes is going well.   Get fasting lab.   Let me know if you do not hear about sleep study.   Recheck in 3-6 months.   Get mammogram.

## 2017-12-22 NOTE — Progress Notes (Signed)
[name] is a 57 y.o. female who presents to [place]: [place] today for follow-up diabetes, asthma, foot pain.  [name] has diabetes typically well controlled with medications listed below.  She notes her blood sugars are well controlled with no episodes of hyper or hypo-glycemia.  She notes that she is been less adherent to a good diabetes diet recently and plans on resuming a low carbohydrate diet.  She also plans on increasing her exercise.  She is feeling a bit better with her lungs and thinks that should be able to comply with a more rigorous lifestyle.  [name] has been seen by her pulmonologist recently and notes that her asthma is typically pretty well controlled.  She is breathing more easily.  She is also started from working from home which has helped a lot.   Lastly she notes continued bothersome foot and heel pain bilaterally.  She has been diagnosed with Achilles tendinitis and plantar fasciitis as well as a leg length discrepancy.  She is done well with differential height heel lifts (hapad type) in the past.  She was provided some in the past that were custom trimmed down to the right height but does eventually wore out.  She could not remember what brand it was and was unable to purchase anymore.  She like to have a new set made and be reminded where she can buy more herself.  She notes the inserts helped a lot.   ROS as above:  Exam:  BP (!) 149/82   Pulse 83   Ht 5' 4"  (1.626 m)   Wt 219 lb (99.3 kg)   BMI 37.59 kg/m  Wt Readings from Last 5 Encounters:  12/22/17 219 lb (99.3 kg)  10/24/17 218 lb (98.9 kg)  08/29/17 216 lb (98 kg)  08/05/17 219 lb (99.3 kg)  07/26/17 219 lb (99.3 kg)    Gen: Well NAD HEENT: EOMI,  MMM Lungs: Normal work of breathing. CTABL Heart: RRR no MRG Abd: NABS, Soft. Nondistended, Nontender Exts: Brisk capillary refill, warm  and well perfused.  Feet relatively normal-appearing bilaterally.  Mildly tender palpation plantar calcaneus.  Mild antalgic gait.  Lab and Radiology Results Results for orders placed or performed in visit on 12/22/17 (from the past 72 hour(s))  POCT HgB A1C     Status: Abnormal   Collection Time: 12/22/17  9:14 AM  Result Value Ref Range   Hemoglobin A1C 6.8 (A) 4.0 - 5.6 %   HbA1c POC (<> result, manual entry)     HbA1c, POC (prediabetic range)     HbA1c, POC (controlled diabetic range)     No results found.    Assessment and Plan: 57 y.o. female with  Diabetes: Slight increase A1c.  Plan to  adhere to diabetes diet and increase exercise.  Recheck in 3 months or sooner if needed.  Asthma: Doing reasonably well.  Working from home certainly is helpful as she has less occupational allergen exposures.  Continue comanagement with pulmonology.  Foot pain: Achilles tendon and plantar fasciitis.  New heel lifts made.  Patient should use about 1/2 inch and 1/4 inch height heel lifts via hay pad in the future.  Continue home exercise program including eccentric exercises and ice.  Health maintenance: Additionally patient is overdue for cervical cancer screening.  She like a referral to OB/GYN locally.  Additionally she is overdue for breast cancer screening and will schedule mammogram. She is also  overdue for diabetic eye exam will remind patient to schedule an appointment. Colon cancer screening postponed previously. Vaccines up-to-date  Orders Placed This Encounter  Procedures  . POCT HgB A1C   No orders of the defined types were placed in this encounter.    Historical information moved to improve visibility of documentation.  Past Medical History:  Diagnosis Date  . ALLERGIC RHINITIS 04/10/2008  . Carpal tunnel syndrome of right wrist   . DEPRESSION 08/13/2008  . Diabetes mellitus without complication ([place])   . HYPERLIPIDEMIA 08/04/2006  . HYPERTENSION 08/04/2006  .  HYPOTHYROIDISM 08/04/2006  . LOW BACK PAIN 08/04/2006  . Neuromuscular disorder ([place])   . PERIMENOPAUSAL SYNDROME 05/08/2008  . PVD (peripheral vascular disease) ([place])    occlusive, status post bifem bypass 2007   Past Surgical History:  Procedure Laterality Date  . CARPAL TUNNEL RELEASE Right 04/05/2016   Procedure: OPEN RIGHT CARPAL TUNNEL RELEASE;  Surgeon: Jessy Oto, MD;  Location: [facility];  Service: Orthopedics;  Laterality: Right;  . CHOLECYSTECTOMY    . FEMORAL BYPASS     bifem.  Marland Kitchen HAGLAND'S DEFORMITY EXCISION    . TUBAL LIGATION     Social History   Tobacco Use  . Smoking status: Former Smoker    Last attempt to quit: 02/15/2004    Years since quitting: 13.8  . Smokeless tobacco: Never Used  Substance Use Topics  . Alcohol use: No   family history includes Alcohol abuse in her brother and maternal uncle; Diabetes in her maternal grandmother; Heart disease in her father; Hyperlipidemia in her father; Hypertension in her father and mother; Stroke in her maternal uncle.  Medications: Current Outpatient Medications  Medication Sig Dispense Refill  . albuterol (PROVENTIL HFA;VENTOLIN HFA) 108 (90 Base) MCG/ACT inhaler INHALE 2 PUFFS INTO THE LUNGS EVERY 6 HOURS AS NEEDED FOR WHEEZING OR SHORTNESS OF BREATH 18 g 0  . albuterol (PROVENTIL) (2.5 MG/3ML) 0.083% nebulizer solution Take 3 mLs (2.5 mg total) by nebulization every 6 (six) hours as needed for wheezing or shortness of breath. 150 mL 1  . aspirin EC 81 MG tablet Take 1 tablet (81 mg total) by mouth daily. 90 tablet 3  . atorvastatin (LIPITOR) 40 MG tablet Take by mouth.    Marland Kitchen azelastine (ASTELIN) 0.1 % nasal spray Place into the nose.    . benazepril (LOTENSIN) 10 MG tablet TAKE 1 TABLET(10 MG) BY MOUTH DAILY 90 tablet 0  . BREO ELLIPTA 200-25 MCG/INH AEPB INL 1 PUFF ITL QD  11  . diclofenac sodium (VOLTAREN) 1 % GEL Apply 2 g topically 4 (four) times daily. To affected joint. 100 g 11  . dicyclomine  (BENTYL) 10 MG capsule Take 1 capsule (10 mg total) by mouth 4 (four) times daily -  before meals and at bedtime. 120 capsule 12  . fexofenadine (ALLEGRA) 180 MG tablet Take 1 tablet (180 mg total) by mouth daily. 30 tablet 12  . levothyroxine (SYNTHROID, LEVOTHROID) 88 MCG tablet TAKE 1 TABLET BY MOUTH EVERY DAY 30 tablet 1  . metoprolol tartrate (LOPRESSOR) 50 MG tablet Take 1 tablet (50 mg total) by mouth 2 (two) times daily. 60 tablet 3  . omeprazole (PRILOSEC) 40 MG capsule Take by mouth.    . tiotropium (SPIRIVA HANDIHALER) 18 MCG inhalation capsule Place 1 Inhaler into inhaler and inhale daily.    Marland Kitchen triamcinolone (NASACORT) 55 MCG/ACT AERO nasal inhaler U 1 SPRAY IEN QPM  12  . triamcinolone cream (KENALOG) 0.5 %  Apply 1 application topically 2 (two) times daily. To affected areas. 30 g 3  . venlafaxine XR (EFFEXOR-XR) 75 MG 24 hr capsule TAKE 1 CAPSULE(75 MG) BY MOUTH DAILY WITH BREAKFAST 90 capsule 0   No current facility-administered medications for this visit.    Allergies  Allergen Reactions  . Gluten Meal Diarrhea  . Influenza Virus Vacc Split Pf Other (See Comments)    Swelling around injection site  . Lipitor [Atorvastatin] Other (See Comments)    Dizziness.      Discussed warning signs or symptoms. Please see discharge instructions. Patient expresses understanding.

## 2017-12-29 ENCOUNTER — Ambulatory Visit: Payer: PRIVATE HEALTH INSURANCE | Admitting: Family Medicine

## 2018-01-13 ENCOUNTER — Other Ambulatory Visit: Payer: Self-pay

## 2018-01-13 ENCOUNTER — Emergency Department
Admission: EM | Admit: 2018-01-13 | Discharge: 2018-01-13 | Disposition: A | Discharge status: Home / Self Care | Payer: PRIVATE HEALTH INSURANCE | Source: Home / Self Care

## 2018-01-13 DIAGNOSIS — M7661 Achilles tendinitis, right leg: Secondary | ICD-10-CM

## 2018-01-13 MED ORDER — ACETAMINOPHEN-CODEINE #3 300-30 MG PO TABS: 1.0000 | ORAL_TABLET | Freq: Four times a day (QID) | ORAL | 0 refills | Status: DC | PRN

## 2018-01-13 NOTE — ED Triage Notes (Signed)
Here with right foot bone spur flare up after standing on feet yesterday. Medial pain radiating across right heel; sharp, achy intermit pain. Tried OTC Ibuprofen for relief. Plan for surgery after holidays.

## 2018-01-13 NOTE — Discharge Instructions (Signed)
Ice to area for 20 minutes every couple hours Wear boot at all times you are upright Try the Voltaren gel on this area Continue anti-inflammatory medication Take Tylenol 3 when pain is severe.  Caution drowsiness, do not drive on this medicine See Dr. Sharol Given in follow-up

## 2018-01-19 ENCOUNTER — Other Ambulatory Visit: Payer: Self-pay | Admitting: Family Medicine

## 2018-01-29 ENCOUNTER — Ambulatory Visit (INDEPENDENT_AMBULATORY_CARE_PROVIDER_SITE_OTHER): Payer: 59 | Admitting: Orthopedic Surgery

## 2018-01-29 ENCOUNTER — Ambulatory Visit (INDEPENDENT_AMBULATORY_CARE_PROVIDER_SITE_OTHER): Payer: 59

## 2018-01-29 ENCOUNTER — Encounter (INDEPENDENT_AMBULATORY_CARE_PROVIDER_SITE_OTHER): Payer: Self-pay | Admitting: Orthopedic Surgery

## 2018-01-29 VITALS — Ht 64.0 in | Wt 223.4 lb

## 2018-01-29 DIAGNOSIS — M7661 Achilles tendinitis, right leg: Secondary | ICD-10-CM | POA: Diagnosis not present

## 2018-01-29 DIAGNOSIS — M79671 Pain in right foot: Secondary | ICD-10-CM | POA: Diagnosis not present

## 2018-01-29 DIAGNOSIS — M6701 Short Achilles tendon (acquired), right ankle: Secondary | ICD-10-CM | POA: Diagnosis not present

## 2018-01-29 NOTE — Progress Notes (Signed)
Office Visit Note   Patient: [name]           Date of Birth: 08-10-1960           MRN: [id number] Visit Date: 01/29/2018              Requested by: Gregor Hams, MD [address] [place], [place] [address] PCP: Gregor Hams, MD  Chief Complaint  Patient presents with  . Right Foot - Pain      HPI: Patient is a 57 year old woman who presents complaining of Achilles tendinitis.  She states she has had bone spurs in the past she has had steroid injections without relief.  Patient complains of tingling pain and edema in the posterior aspect of her ankle.  Assessment & Plan: Visit Diagnoses:  1. Right foot pain   2. Achilles tendinitis, right leg   3. Achilles tendon contracture, right     Plan: Patient was given instructions and demonstrated heel cord stretching.  The insertional calcification of the Achilles enthesopathy should improve with therapy.  Discussed that with heel cord stretching if this does not get better we could consider a gastrocnemius recession.  Follow-Up Instructions: Return in about 4 weeks (around 02/26/2018).   Ortho Exam  Patient is alert, oriented, no adenopathy, well-dressed, normal affect, normal respiratory effort. Examination patient has good dorsalis pedis and posterior tibial pulse she has dorsiflexion only to neutral she has callus across the forefoot secondary to increased pressure from her Achilles contracture.  There are no nodular changes in the Achilles no swelling.  She does have some tenderness to palpation over the insertion of the Achilles and the origin of the plantar fascia.  Lateral compression of the calcaneus is nontender the tarsal tunnel is nontender to palpation.  Imaging: Xr Foot Complete Right  Result Date: 01/29/2018 CLINICAL DATA:  Pain EXAM: RIGHT FOOT COMPLETE - 3+ VIEW COMPARISON:  None. FINDINGS: Frontal, oblique, and lateral views obtained. There is no acute fracture or dislocation. There is benign  periosteal reaction involving the fourth metatarsal, potential residua of prior trauma. Joint spaces appear normal. No erosive change. There are posterior and inferior calcaneal spurs. IMPRESSION: Suspect old trauma with benign periosteal reaction fourth metatarsal. No acute fracture or dislocation. No appreciable joint space narrowing. There are calcaneal spurs. Electronically Signed   By: Lowella Grip III M.D.   On: 01/29/2018 09:00   No images are attached to the encounter.  Labs: Lab Results  Component Value Date   HGBA1C 6.8 (A) 12/22/2017   HGBA1C 6.6 05/31/2017   HGBA1C 6.4 (H) 02/24/2017     Lab Results  Component Value Date   ALBUMIN 4.1 07/25/2016   ALBUMIN 4.3 12/14/2015   ALBUMIN 4.2 11/17/2015    Body mass index is 38.35 kg/m.  Orders:  Orders Placed This Encounter  Procedures  . XR Foot Complete Right   No orders of the defined types were placed in this encounter.    Procedures: No procedures performed  Clinical Data: No additional findings.  ROS:  All other systems negative, except as noted in the HPI. Review of Systems  Objective: Vital Signs: Ht 5' 4"  (1.626 m)   Wt 223 lb 6.4 oz (101.3 kg)   BMI 38.35 kg/m   Specialty Comments:  No specialty comments available.  PMFS History: Patient Active Problem List   Diagnosis Date Noted  . Migraine with aura and without status migrainosus, not intractable 05/31/2017  . Plantar fasciitis,  right 04/12/2017  . Asthma 04/12/2017  . SOB (shortness of breath) 07/12/2016  . Plantar fasciitis, left 11/17/2015  . Fatigue 11/17/2015  . 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  . Type 2 diabetes mellitus with vascular disease ([place]) 01/14/2015  . Vitamin D deficiency 01/14/2015  . Morbid obesity ([place]) 01/13/2015  . Fatty liver disease, nonalcoholic 49/17/9150  . Peripheral vascular disease ([place]) 07/15/2011  . DEPRESSION 08/13/2008  . PERIMENOPAUSAL SYNDROME 05/08/2008  . ALLERGIC RHINITIS  04/10/2008  . Hypothyroidism 08/04/2006  . Dyslipidemia 08/04/2006  . Essential hypertension 08/04/2006   Past Medical History:  Diagnosis Date  . ALLERGIC RHINITIS 04/10/2008  . Carpal tunnel syndrome of right wrist   . DEPRESSION 08/13/2008  . Diabetes mellitus without complication ([place])   . HYPERLIPIDEMIA 08/04/2006  . HYPERTENSION 08/04/2006  . HYPOTHYROIDISM 08/04/2006  . LOW BACK PAIN 08/04/2006  . Neuromuscular disorder ([place])   . PERIMENOPAUSAL SYNDROME 05/08/2008  . PVD (peripheral vascular disease) ([place])    occlusive, status post bifem bypass 2007    Family History  Problem Relation Age of Onset  . Hypertension Mother   . Heart disease Father   . Hyperlipidemia Father   . Hypertension Father   . Alcohol abuse Brother   . Diabetes Maternal Grandmother   . Alcohol abuse Maternal Uncle   . Stroke Maternal Uncle     Past Surgical History:  Procedure Laterality Date  . CARPAL TUNNEL RELEASE Right 04/05/2016   Procedure: OPEN RIGHT CARPAL TUNNEL RELEASE;  Surgeon: Jessy Oto, MD;  Location: [facility];  Service: Orthopedics;  Laterality: Right;  . CHOLECYSTECTOMY    . FEMORAL BYPASS     bifem.  Marland Kitchen HAGLAND'S DEFORMITY EXCISION    . TUBAL LIGATION     Social History   Occupational History  . Not on file  Tobacco Use  . Smoking status: Former Smoker    Last attempt to quit: 02/15/2004    Years since quitting: 13.9  . Smokeless tobacco: Never Used  Substance and Sexual Activity  . Alcohol use: No  . Drug use: No  . Sexual activity: Yes    Birth control/protection: Post-menopausal

## 2018-02-16 ENCOUNTER — Other Ambulatory Visit: Payer: Self-pay | Admitting: Family Medicine

## 2018-02-26 ENCOUNTER — Encounter (INDEPENDENT_AMBULATORY_CARE_PROVIDER_SITE_OTHER): Payer: Self-pay | Admitting: Orthopedic Surgery

## 2018-02-26 ENCOUNTER — Ambulatory Visit (INDEPENDENT_AMBULATORY_CARE_PROVIDER_SITE_OTHER): Payer: 59 | Admitting: Orthopedic Surgery

## 2018-02-26 VITALS — Ht 64.0 in | Wt 223.4 lb

## 2018-02-26 DIAGNOSIS — M6701 Short Achilles tendon (acquired), right ankle: Secondary | ICD-10-CM

## 2018-02-26 DIAGNOSIS — M722 Plantar fascial fibromatosis: Secondary | ICD-10-CM | POA: Diagnosis not present

## 2018-02-26 NOTE — Progress Notes (Signed)
Office Visit Note   Patient: [name]           Date of Birth: [birth year]           MRN: [id number] Visit Date: 02/26/2018              Requested by: Gregor Hams, MD [address] [place], [place] [address] PCP: Gregor Hams, MD  Chief Complaint  Patient presents with  . Right Foot - Follow-up, Pain      HPI: Patient is a 58 year old woman who presents in follow-up for Achilles tendon contracture on the right with plantar fasciitis.  Patient states the stretching has helped a little but not completely relieve the pain.  She states she started using an infrared slipper and this has helped decrease swelling and pain.  Assessment & Plan: Visit Diagnoses:  1. Achilles tendon contracture, right   2. Plantar fasciitis, right     Plan: Recommended continue the stretching continue her infrared therapy.  Discussed that if she fails the conservative treatment we could proceed with a gastrocnemius recession and plantar fascial release as an outpatient.  Follow-Up Instructions: Return if symptoms worsen or fail to improve.   Ortho Exam  Patient is alert, oriented, no adenopathy, well-dressed, normal affect, normal respiratory effort. Examination patient has a good pulse there is no swelling.  She still has significant tightness in the Achilles with dorsiflexion only to neutral she is tender to palpation of the origin the plantar fascia the tarsal tunnel is nontender to palpation lateral compression of the calcaneus is nontender.  Imaging: No results found. No images are attached to the encounter.  Labs: Lab Results  Component Value Date   HGBA1C 6.8 (A) 12/22/2017   HGBA1C 6.6 05/31/2017   HGBA1C 6.4 (H) 02/24/2017     Lab Results  Component Value Date   ALBUMIN 4.1 07/25/2016   ALBUMIN 4.3 12/14/2015   ALBUMIN 4.2 11/17/2015    Body mass index is 38.35 kg/m.  Orders:  No orders of the defined types were placed in this encounter.  No orders of  the defined types were placed in this encounter.    Procedures: No procedures performed  Clinical Data: No additional findings.  ROS:  All other systems negative, except as noted in the HPI. Review of Systems  Objective: Vital Signs: Ht 5' 4"  (1.626 m)   Wt 223 lb 6.4 oz (101.3 kg)   BMI 38.35 kg/m   Specialty Comments:  No specialty comments available.  PMFS History: Patient Active Problem List   Diagnosis Date Noted  . Migraine with aura and without status migrainosus, not intractable 05/31/2017  . Plantar fasciitis, right 04/12/2017  . Asthma 04/12/2017  . SOB (shortness of breath) 07/12/2016  . Plantar fasciitis, left 11/17/2015  . Fatigue 11/17/2015  . 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  . Type 2 diabetes mellitus with vascular disease ([place]) 01/14/2015  . Vitamin D deficiency 01/14/2015  . Morbid obesity ([place]) 01/13/2015  . Fatty liver disease, nonalcoholic 02/63/7858  . Peripheral vascular disease ([place]) 07/15/2011  . DEPRESSION 08/13/2008  . PERIMENOPAUSAL SYNDROME 05/08/2008  . ALLERGIC RHINITIS 04/10/2008  . Hypothyroidism 08/04/2006  . Dyslipidemia 08/04/2006  . Essential hypertension 08/04/2006   Past Medical History:  Diagnosis Date  . ALLERGIC RHINITIS 04/10/2008  . Carpal tunnel syndrome of right wrist   . DEPRESSION 08/13/2008  . Diabetes mellitus without complication ([place])   . HYPERLIPIDEMIA 08/04/2006  . HYPERTENSION 08/04/2006  . HYPOTHYROIDISM  08/04/2006  . LOW BACK PAIN 08/04/2006  . Neuromuscular disorder ([place])   . PERIMENOPAUSAL SYNDROME 05/08/2008  . PVD (peripheral vascular disease) ([place])    occlusive, status post bifem bypass 2007    Family History  Problem Relation Age of Onset  . Hypertension Mother   . Heart disease Father   . Hyperlipidemia Father   . Hypertension Father   . Alcohol abuse Brother   . Diabetes Maternal Grandmother   . Alcohol abuse Maternal Uncle   . Stroke Maternal Uncle     Past Surgical History:    Procedure Laterality Date  . CARPAL TUNNEL RELEASE Right 04/05/2016   Procedure: OPEN RIGHT CARPAL TUNNEL RELEASE;  Surgeon: Jessy Oto, MD;  Location: [facility];  Service: Orthopedics;  Laterality: Right;  . CHOLECYSTECTOMY    . FEMORAL BYPASS     bifem.  Marland Kitchen HAGLAND'S DEFORMITY EXCISION    . TUBAL LIGATION     Social History   Occupational History  . Not on file  Tobacco Use  . Smoking status: Former Smoker    Last attempt to quit: 02/15/2004    Years since quitting: 14.0  . Smokeless tobacco: Never Used  Substance and Sexual Activity  . Alcohol use: No  . Drug use: No  . Sexual activity: Yes    Birth control/protection: Post-menopausal

## 2018-02-28 ENCOUNTER — Encounter: Payer: Self-pay | Admitting: Family Medicine

## 2018-03-06 ENCOUNTER — Other Ambulatory Visit: Payer: Self-pay | Admitting: Family Medicine

## 2018-03-06 DIAGNOSIS — I10 Essential (primary) hypertension: Secondary | ICD-10-CM

## 2018-03-14 NOTE — ED Provider Notes (Signed)
[facility]    CSN: [id number] Arrival date & time: 01/13/18  1128     History   Chief Complaint Chief Complaint  Patient presents with  . Foot Pain    bone spur flare up    HPI [name] is a 58 y.o. female.   HPI  Patient has ongoing foot problems and is under the care of Dr Sharol Given in Orthopedics.  She has known "bone spurs" that periodically hurt.  Has been advised she may need surgery but cannot do this until after the holiday season.  After a prolonged days on her feet she has severe pain today and can hardly walk.  No trauma.  Needs help with pain management.  Is on a NSAID.  Uses ice.    Past Medical History:  Diagnosis Date  . ALLERGIC RHINITIS 04/10/2008  . Carpal tunnel syndrome of right wrist   . DEPRESSION 08/13/2008  . Diabetes mellitus without complication ([place])   . HYPERLIPIDEMIA 08/04/2006  . HYPERTENSION 08/04/2006  . HYPOTHYROIDISM 08/04/2006  . LOW BACK PAIN 08/04/2006  . Neuromuscular disorder ([place])   . PERIMENOPAUSAL SYNDROME 05/08/2008  . PVD (peripheral vascular disease) ([place])    occlusive, status post bifem bypass 2007    Patient Active Problem List   Diagnosis Date Noted  . Migraine with aura and without status migrainosus, not intractable 05/31/2017  . Plantar fasciitis, right 04/12/2017  . Asthma 04/12/2017  . SOB (shortness of breath) 07/12/2016  . Plantar fasciitis, left 11/17/2015  . Fatigue 11/17/2015  . 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  . Type 2 diabetes mellitus with vascular disease ([place]) 01/14/2015  . Vitamin D deficiency 01/14/2015  . Morbid obesity ([place]) 01/13/2015  . Fatty liver disease, nonalcoholic 31/51/7616  . Peripheral vascular disease ([place]) 07/15/2011  . DEPRESSION 08/13/2008  . PERIMENOPAUSAL SYNDROME 05/08/2008  . ALLERGIC RHINITIS 04/10/2008  . Hypothyroidism 08/04/2006  . Dyslipidemia 08/04/2006  . Essential hypertension 08/04/2006    Past Surgical History:  Procedure Laterality Date  .  CARPAL TUNNEL RELEASE Right 04/05/2016   Procedure: OPEN RIGHT CARPAL TUNNEL RELEASE;  Surgeon: Jessy Oto, MD;  Location: [facility];  Service: Orthopedics;  Laterality: Right;  . CHOLECYSTECTOMY    . FEMORAL BYPASS     bifem.  Marland Kitchen HAGLAND'S DEFORMITY EXCISION    . TUBAL LIGATION      OB History   No obstetric history on file.      Home Medications    Prior to Admission medications   Medication Sig Start Date End Date Taking? Authorizing Provider  azithromycin (ZITHROMAX) 250 MG tablet Take by mouth daily.   Yes [provider]  acetaminophen-codeine (TYLENOL #3) 300-30 MG tablet Take 1 tablet by mouth every 6 (six) hours as needed for moderate pain. 01/13/18   Raylene Everts, MD  albuterol (PROVENTIL HFA;VENTOLIN HFA) 108 (90 Base) MCG/ACT inhaler INHALE 2 PUFFS INTO THE LUNGS EVERY 6 HOURS AS NEEDED FOR WHEEZING OR SHORTNESS OF BREATH 06/19/17   Gregor Hams, MD  albuterol (PROVENTIL) (2.5 MG/3ML) 0.083% nebulizer solution Take 3 mLs (2.5 mg total) by nebulization every 6 (six) hours as needed for wheezing or shortness of breath. 04/20/17   Gregor Hams, MD  aspirin EC 81 MG tablet Take 1 tablet (81 mg total) by mouth daily. 08/29/17   Gregor Hams, MD  atorvastatin (LIPITOR) 40 MG tablet Take by mouth.    [provider]  azelastine (ASTELIN) 0.1 % nasal spray Place  into the nose. 12/16/16   [provider]  benazepril (LOTENSIN) 10 MG tablet TAKE 1 TABLET BY MOUTH EVERY DAY 03/06/18   Gregor Hams, MD  BREO ELLIPTA 200-25 MCG/INH AEPB INL 1 PUFF ITL QD 10/09/17   [provider]  diclofenac sodium (VOLTAREN) 1 % GEL Apply 2 g topically 4 (four) times daily. To affected joint. 01/13/15   Gregor Hams, MD  dicyclomine (BENTYL) 10 MG capsule Take 1 capsule (10 mg total) by mouth 4 (four) times daily -  before meals and at bedtime. 09/26/16   Gregor Hams, MD  fexofenadine (ALLEGRA) 180 MG tablet Take 1 tablet (180 mg total) by mouth  daily. 01/13/15   Gregor Hams, MD  levothyroxine (SYNTHROID, LEVOTHROID) 88 MCG tablet TAKE 1 TABLET BY MOUTH EVERY DAY 02/16/18   Gregor Hams, MD  metoprolol tartrate (LOPRESSOR) 50 MG tablet Take 1 tablet (50 mg total) by mouth 2 (two) times daily. 12/08/17   Gregor Hams, MD  omeprazole (PRILOSEC) 40 MG capsule Take by mouth. 06/15/17 06/15/18  [provider]  tiotropium (SPIRIVA HANDIHALER) 18 MCG inhalation capsule Place 1 Inhaler into inhaler and inhale daily. 07/18/17 07/18/18  [provider]  triamcinolone (NASACORT) 55 MCG/ACT AERO nasal inhaler U 1 SPRAY IEN QPM 07/18/17   [provider]  triamcinolone cream (KENALOG) 0.5 % Apply 1 application topically 2 (two) times daily. To affected areas. 07/26/17   Gregor Hams, MD  venlafaxine XR (EFFEXOR-XR) 75 MG 24 hr capsule TAKE 1 CAPSULE BY MOUTH EVERY DAY WITH BREAKFAST 01/19/18   Gregor Hams, MD    Family History Family History  Problem Relation Age of Onset  . Hypertension Mother   . Heart disease Father   . Hyperlipidemia Father   . Hypertension Father   . Alcohol abuse Brother   . Diabetes Maternal Grandmother   . Alcohol abuse Maternal Uncle   . Stroke Maternal Uncle     Social History Social History   Tobacco Use  . Smoking status: Former Smoker    Last attempt to quit: 02/15/2004    Years since quitting: 14.0  . Smokeless tobacco: Never Used  Substance Use Topics  . Alcohol use: No  . Drug use: No     Allergies   Gluten meal; Influenza virus vacc split pf; and Lipitor [atorvastatin]   Review of Systems Review of Systems  Constitutional: Negative for chills and fever.  HENT: Negative for ear pain and sore throat.   Eyes: Negative for pain and visual disturbance.  Respiratory: Negative for cough and shortness of breath.   Cardiovascular: Negative for chest pain and palpitations.  Gastrointestinal: Negative for abdominal pain and vomiting.  Genitourinary: Negative for dysuria and  hematuria.  Musculoskeletal: Positive for gait problem. Negative for arthralgias and back pain.  Skin: Negative for color change and rash.  Neurological: Negative for seizures and syncope.  All other systems reviewed and are negative.    Physical Exam Triage Vital Signs ED Triage Vitals  Enc Vitals Group     BP 01/13/18 1146 112/75     Pulse Rate 01/13/18 1146 60     Resp --      Temp 01/13/18 1146 97.8 F (36.6 C)     Temp Source 01/13/18 1146 Oral     SpO2 01/13/18 1146 99 %     Weight 01/13/18 1147 223 lb 6.4 oz (101.3 kg)     Height 01/13/18 1147 5' 4"  (1.626 m)  Head Circumference --      Peak Flow --      Pain Score 01/13/18 1146 7     Pain Loc --      Pain Edu? --      Excl. in [place]? --    No data found.  Updated Vital Signs BP 112/75 (BP Location: Left Arm)   Pulse 60   Temp 97.8 F (36.6 C) (Oral)   Ht 5' 4"  (1.626 m)   Wt 101.3 kg   SpO2 99%   BMI 38.35 kg/m   Visual Acuity Right Eye Distance:   Left Eye Distance:   Bilateral Distance:    Right Eye Near:   Left Eye Near:    Bilateral Near:     Physical Exam Constitutional:      General: She is not in acute distress.    Appearance: She is well-developed. She is obese.  HENT:     Head: Normocephalic and atraumatic.  Eyes:     Conjunctiva/sclera: Conjunctivae normal.     Pupils: Pupils are equal, round, and reactive to light.  Neck:     Musculoskeletal: Normal range of motion.  Cardiovascular:     Rate and Rhythm: Normal rate.  Pulmonary:     Effort: Pulmonary effort is normal. No respiratory distress.  Abdominal:     General: There is no distension.     Palpations: Abdomen is soft.  Musculoskeletal: Normal range of motion.     Comments: Lower extremities are examined.  right posterior heel is slightly swollen and acutely tender with palpation of achilles tendon insertion.  Pain with dorsiflexion of foot.  Antalgic gait.  Skin:    General: Skin is warm and dry.  Neurological:     Mental  Status: She is alert.      UC Treatments / Results  Labs (all labs ordered are listed, but only abnormal results are displayed) Labs Reviewed - No data to display  EKG None  Radiology No results found.  Procedures Procedures (including critical care time)  Medications Ordered in UC Medications - No data to display  Initial Impression / Assessment and Plan / UC Course  I have reviewed the triage vital signs and the nursing notes.  Pertinent labs & imaging results that were available during my care of the patient were reviewed by me and considered in my medical decision making (see chart for details).     Reviewed symptomatic care.  Heel lift recommended.  Is already on a NSAID.  Also has voltaren gel.  Pain medicine for AS NEEDED USE.  Cautioned not to drive.  The boot is needed for ambulation but is cautioned to stretch heel cord many times a day to tolerance. Final Clinical Impressions(s) / UC Diagnoses   Final diagnoses:  Achilles tendinitis of right lower extremity     Discharge Instructions     Ice to area for 20 minutes every couple hours Wear boot at all times you are upright Try the Voltaren gel on this area Continue anti-inflammatory medication Take Tylenol 3 when pain is severe.  Caution drowsiness, do not drive on this medicine See Dr. Sharol Given in follow-up   ED Prescriptions    Medication Sig Dispense Auth. Provider   acetaminophen-codeine (TYLENOL #3) 300-30 MG tablet Take 1 tablet by mouth every 6 (six) hours as needed for moderate pain. 10 tablet Raylene Everts, MD     Controlled Substance Prescriptions [place] Controlled Substance Registry consulted? Yes, I have consulted the [place] Controlled  Substances Registry for this patient, and feel the risk/benefit ratio today is favorable for proceeding with this prescription for a controlled substance.   Raylene Everts, MD 03/14/18 [phone]

## 2018-03-15 ENCOUNTER — Other Ambulatory Visit: Payer: Self-pay | Admitting: Family Medicine

## 2018-03-19 ENCOUNTER — Other Ambulatory Visit: Payer: Self-pay | Admitting: Family Medicine

## 2018-03-26 ENCOUNTER — Encounter (INDEPENDENT_AMBULATORY_CARE_PROVIDER_SITE_OTHER): Payer: Self-pay | Admitting: Specialist

## 2018-03-26 ENCOUNTER — Ambulatory Visit (INDEPENDENT_AMBULATORY_CARE_PROVIDER_SITE_OTHER): Payer: 59

## 2018-03-26 ENCOUNTER — Ambulatory Visit: Payer: PRIVATE HEALTH INSURANCE | Admitting: Family Medicine

## 2018-03-26 ENCOUNTER — Ambulatory Visit (INDEPENDENT_AMBULATORY_CARE_PROVIDER_SITE_OTHER): Payer: Self-pay

## 2018-03-26 ENCOUNTER — Ambulatory Visit (INDEPENDENT_AMBULATORY_CARE_PROVIDER_SITE_OTHER): Payer: 59 | Admitting: Specialist

## 2018-03-26 VITALS — BP 139/78 | HR 61 | Ht 64.0 in | Wt 223.0 lb

## 2018-03-26 DIAGNOSIS — M47812 Spondylosis without myelopathy or radiculopathy, cervical region: Secondary | ICD-10-CM | POA: Diagnosis not present

## 2018-03-26 DIAGNOSIS — M25511 Pain in right shoulder: Secondary | ICD-10-CM

## 2018-03-26 DIAGNOSIS — M19011 Primary osteoarthritis, right shoulder: Secondary | ICD-10-CM

## 2018-03-26 DIAGNOSIS — M542 Cervicalgia: Secondary | ICD-10-CM | POA: Diagnosis not present

## 2018-03-26 MED ORDER — METHYLPREDNISOLONE 4 MG PO TABS: ORAL_TABLET | ORAL | 0 refills | Status: DC

## 2018-03-26 NOTE — Progress Notes (Signed)
Office Visit Note   Patient: [name]           Date of Birth: April 20, 1960           MRN: [id number] Visit Date: 03/26/2018              Requested by: Gregor Hams, MD [address] [place], [place] [address] PCP: Gregor Hams, MD   Assessment & Plan: Visit Diagnoses:  1. Cervicalgia   2. Right shoulder pain, unspecified chronicity   3. Spondylosis without myelopathy or radiculopathy, cervical region   4. Osteoarthritis of right AC (acromioclavicular) joint     Plan: Avoid overhead lifting and overhead use of the arms. Do not lift greater than 5 lbs. Adjust head rest in vehicle to prevent hyperextension if rear ended. Take extra precautions to avoid falling. Avoid overhead lifting and overhead use of the arms. Do not lift greater than 10 lbs. Tylenol ES one every 6-8 hours for pain and inflamation. Call if you are having right knee pain and need to consider a cortisone injection into the right knee. Go to  PT for 4 weeks, then a home exercise program for neck and right shoulder Medrol 4 mg daily for 2 weeks then 2 mg daily for 2 weeks.  Follow-Up Instructions: Return in about 4 weeks (around 04/23/2018).   Orders:  Orders Placed This Encounter  Procedures  . XR Cervical Spine 2 or 3 views  . XR Shoulder Right  . Ambulatory referral to Physical Therapy   Meds ordered this encounter  Medications  . methylPREDNISolone (MEDROL) 4 MG tablet    Sig: Take one tablet po daily for 2 weeks then 1/2 tablet po daily for 2 weeks then stop.    Dispense:  21 tablet    Refill:  0      Procedures: No procedures performed   Clinical Data: No additional findings.   Subjective: Chief Complaint  Patient presents with  . Neck - Pain  . Right Shoulder - Pain    58 year old female right handed with pain in the neck and radiation into the right lateral arm and right anterior acromion. The pain in the right arm is worsening and she has pain associated with the  right arm. It is worse with lifting the right arm and it comes on for I don't know.  Seems the neck goes out every January and February. There is mobility of the neck and right trapezius. She uses a home gym and moving she feels like she might worsening the pain.    Review of Systems  HENT: Positive for drooling.      Objective: Vital Signs: BP 139/78 (BP Location: Left Arm, Patient Position: Sitting)   Pulse 61   Ht 5' 4"  (1.626 m)   Wt 223 lb (101.2 kg)   BMI 38.28 kg/m   Physical Exam Constitutional:      Appearance: She is well-developed.  HENT:     Head: Normocephalic and atraumatic.  Eyes:     Pupils: Pupils are equal, round, and reactive to light.  Neck:     Musculoskeletal: Normal range of motion and neck supple.  Pulmonary:     Effort: Pulmonary effort is normal.     Breath sounds: Normal breath sounds.  Abdominal:     General: Bowel sounds are normal.     Palpations: Abdomen is soft.  Skin:    General: Skin is warm and dry.  Neurological:  Mental Status: She is alert and oriented to person, place, and time.  Psychiatric:        Behavior: Behavior normal.        Thought Content: Thought content normal.        Judgment: Judgment normal.     Back Exam   Tenderness  The patient is experiencing tenderness in the cervical.  Range of Motion  Extension:  70 abnormal  Flexion: normal  Lateral bend right:  70 abnormal  Lateral bend left:  80 abnormal  Rotation right: 70  Rotation left: 80   Muscle Strength  The patient has normal back strength. Right Quadriceps:  5/5  Left Quadriceps:  5/5  Right Hamstrings:  5/5  Left Hamstrings:  5/5   Tests  Straight leg raise right: negative Straight leg raise left: negative  Reflexes  Babinski's sign: normal   Other  Toe walk: normal Heel walk: normal Sensation: normal Gait: normal   Comments:  Tender right trapezius.      Specialty Comments:  No specialty comments available.  Imaging: Xr  Shoulder Right  Result Date: 03/26/2018 AP, axillary lateral  And outlet view with type II acromion process, minimal cyst formation at the right medial greater tuberosity. Mild DJD right AC joint with some ST swelling superior noted. SAS well maintained. G-H joint is well maintained.     PMFS History: Patient Active Problem List   Diagnosis Date Noted  . Migraine with aura and without status migrainosus, not intractable 05/31/2017  . Plantar fasciitis, right 04/12/2017  . Asthma 04/12/2017  . SOB (shortness of breath) 07/12/2016  . Plantar fasciitis, left 11/17/2015  . Fatigue 11/17/2015  . 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  . Type 2 diabetes mellitus with vascular disease ([facility]) 01/14/2015  . Vitamin D deficiency 01/14/2015  . Morbid obesity ([place]) 01/13/2015  . Fatty liver disease, nonalcoholic 29/52/8413  . Peripheral vascular disease ([place]) 07/15/2011  . DEPRESSION 08/13/2008  . PERIMENOPAUSAL SYNDROME 05/08/2008  . ALLERGIC RHINITIS 04/10/2008  . Hypothyroidism 08/04/2006  . Dyslipidemia 08/04/2006  . Essential hypertension 08/04/2006   Past Medical History:  Diagnosis Date  . ALLERGIC RHINITIS 04/10/2008  . Carpal tunnel syndrome of right wrist   . DEPRESSION 08/13/2008  . Diabetes mellitus without complication ([facility])   . HYPERLIPIDEMIA 08/04/2006  . HYPERTENSION 08/04/2006  . HYPOTHYROIDISM 08/04/2006  . LOW BACK PAIN 08/04/2006  . Neuromuscular disorder ([facility])   . PERIMENOPAUSAL SYNDROME 05/08/2008  . PVD (peripheral vascular disease) ([facility])    occlusive, status post bifem bypass 2007    Family History  Problem Relation Age of Onset  . Hypertension Mother   . Heart disease Father   . Hyperlipidemia Father   . Hypertension Father   . Alcohol abuse Brother   . Diabetes Maternal Grandmother   . Alcohol abuse Maternal Uncle   . Stroke Maternal Uncle     Past Surgical History:  Procedure Laterality Date  . CARPAL TUNNEL RELEASE Right 04/05/2016   Procedure:  OPEN RIGHT CARPAL TUNNEL RELEASE;  Surgeon: Jessy Oto, MD;  Location: [facility];  Service: Orthopedics;  Laterality: Right;  . CHOLECYSTECTOMY    . FEMORAL BYPASS     bifem.  Marland Kitchen HAGLAND'S DEFORMITY EXCISION    . TUBAL LIGATION     Social History   Occupational History  . Not on file  Tobacco Use  . Smoking status: Former Smoker    Last attempt to quit: 02/15/2004    Years since quitting: 14.1  .  Smokeless tobacco: Never Used  Substance and Sexual Activity  . Alcohol use: No  . Drug use: No  . Sexual activity: Yes    Birth control/protection: Post-menopausal

## 2018-03-26 NOTE — Patient Instructions (Signed)
Avoid overhead lifting and overhead use of the arms. Do not lift greater than 5 lbs. Adjust head rest in vehicle to prevent hyperextension if rear ended. Take extra precautions to avoid falling. Avoid overhead lifting and overhead use of the arms. Do not lift greater than 10 lbs. Tylenol ES one every 6-8 hours for pain and inflamation. Call if you are having right knee pain and need to consider a cortisone injection into the right knee. Go to  PT for 4 weeks, then a home exercise program for neck and right shoulder Medrol 4 mg daily for 2 weeks then 2 mg daily for 2 weeks.

## 2018-04-03 ENCOUNTER — Encounter (INDEPENDENT_AMBULATORY_CARE_PROVIDER_SITE_OTHER): Payer: Self-pay | Admitting: Orthopedic Surgery

## 2018-04-03 ENCOUNTER — Ambulatory Visit (INDEPENDENT_AMBULATORY_CARE_PROVIDER_SITE_OTHER): Payer: 59 | Admitting: Orthopedic Surgery

## 2018-04-03 VITALS — Ht 64.0 in | Wt 223.0 lb

## 2018-04-03 DIAGNOSIS — M6701 Short Achilles tendon (acquired), right ankle: Secondary | ICD-10-CM

## 2018-04-03 DIAGNOSIS — M722 Plantar fascial fibromatosis: Secondary | ICD-10-CM

## 2018-04-04 ENCOUNTER — Encounter: Payer: Self-pay | Admitting: Rehabilitative and Restorative Service Providers"

## 2018-04-04 ENCOUNTER — Ambulatory Visit (INDEPENDENT_AMBULATORY_CARE_PROVIDER_SITE_OTHER): Payer: Self-pay | Admitting: Rehabilitative and Restorative Service Providers"

## 2018-04-04 ENCOUNTER — Other Ambulatory Visit: Payer: Self-pay

## 2018-04-04 DIAGNOSIS — R293 Abnormal posture: Secondary | ICD-10-CM

## 2018-04-04 DIAGNOSIS — M25511 Pain in right shoulder: Secondary | ICD-10-CM

## 2018-04-04 DIAGNOSIS — M542 Cervicalgia: Secondary | ICD-10-CM

## 2018-04-04 DIAGNOSIS — R29898 Other symptoms and signs involving the musculoskeletal system: Secondary | ICD-10-CM

## 2018-04-04 NOTE — Therapy (Signed)
[facility] [place], [place], [address] Phone: [phone]   Fax:  [phone]  Physical Therapy Evaluation  Patient Details  Name: [name] MRN: [id number] Date of Birth: 12/26/1960 Referring Provider (PT): Dr Basil Dess    Encounter Date: 04/04/2018  PT End of Session - 04/04/18 1206    Visit Number  1    Number of Visits  18    Date for PT Re-Evaluation  05/16/18    PT Start Time  1103    PT Stop Time  1210    PT Time Calculation (min)  67 min    Activity Tolerance  Patient tolerated treatment well       Past Medical History:  Diagnosis Date  . ALLERGIC RHINITIS 04/10/2008  . Carpal tunnel syndrome of right wrist   . DEPRESSION 08/13/2008  . Diabetes mellitus without complication ([facility])   . HYPERLIPIDEMIA 08/04/2006  . HYPERTENSION 08/04/2006  . HYPOTHYROIDISM 08/04/2006  . LOW BACK PAIN 08/04/2006  . Neuromuscular disorder ([facility])   . PERIMENOPAUSAL SYNDROME 05/08/2008  . PVD (peripheral vascular disease) ([facility])    occlusive, status post bifem bypass 2007    Past Surgical History:  Procedure Laterality Date  . CARPAL TUNNEL RELEASE Right 04/05/2016   Procedure: OPEN RIGHT CARPAL TUNNEL RELEASE;  Surgeon: Jessy Oto, MD;  Location: [facility];  Service: Orthopedics;  Laterality: Right;  . CHOLECYSTECTOMY    . FEMORAL BYPASS     bifem.  Marland Kitchen HAGLAND'S DEFORMITY EXCISION    . TUBAL LIGATION      There were no vitals filed for this visit.   Subjective Assessment - 04/04/18 1112    Subjective  Patient reports that she has had Rt shoulder pain for ~ 6 weeks following an irritatiion with resistive exercies at her home - she felt a pain and has had some continued pain since that time. She is scheduled for Rt foot surgery 3/4/202 for lengthening of gastroc/soleus and plantar fascia.     Pertinent History  neck and back pain for many years; spondylosis; plantar fasciitis and achilles  tendonitis; provious Lt foot surgery ~ 3 yrs ago; gall bladder removed 1989; arthritis; asthma; HTN    Diagnostic tests  xrays     Patient Stated Goals  get shoulders moving so she can exercise again - and be pain free in the shoulders     Currently in Pain?  Yes    Pain Score  3     Pain Location  Shoulder    Pain Orientation  Right    Pain Descriptors / Indicators  Sharp    Pain Type  Acute pain    Pain Radiating Towards  into the neck and shoulder blade as well     Pain Onset  More than a month ago    Pain Frequency  Intermittent    Aggravating Factors   reaching; lifting; moving the wrong way; reaching overhead or behind body; any kind of rotation of the shoulder     Pain Relieving Factors  icy hot; ice; meds;          [facility] PT Assessment - 04/04/18 0001      Assessment   Medical Diagnosis  Rt shoulder pain; AC joint arthritis; bicesps tendonitis; cervical dysfunction     Referring Provider (PT)  Dr Basil Dess     Onset Date/Surgical Date  02/14/18    Hand Dominance  Right    Next MD  Visit  04/23/2018    Prior Therapy  yes       Precautions   Precautions  None      Balance Screen   Has the patient fallen in the past 6 months  No    Has the patient had a decrease in activity level because of a fear of falling?   No    Is the patient reluctant to leave their home because of a fear of falling?   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     Leisure  household chores; cooking; Medical sales representative       Observation/Other Assessments   Focus on Therapeutic  Outcomes (FOTO)   44% limitation       Sensation   Additional Comments  WFL's per pt report       Posture/Postural Control   Posture Comments  head forward; shoulders rounded and elevated; head of the humerus anterior in orientation; scapulae abducted and rotated along the thoracic wall       AROM   Right/Left Shoulder  --   pain increased following ROM movements    Right Shoulder Extension  58 Degrees   ant shoulder paiin    Right Shoulder Flexion  152 Degrees    Right Shoulder ABduction  150 Degrees    Right Shoulder Internal Rotation  40 Degrees    Right Shoulder External Rotation  90 Degrees   discomfort    Left Shoulder Extension  50 Degrees   pulling   Left Shoulder Flexion  145 Degrees   neck hurts   Left Shoulder ABduction  155 Degrees   neck hurts   Left Shoulder Internal Rotation  35 Degrees    Left Shoulder External Rotation  90 Degrees   hurts in neck    Cervical Flexion  43    Cervical Extension  33    Cervical - Right Side Bend  23    Cervical - Left Side Bend  30    Cervical - Right Rotation  44    Cervical - Left Rotation  50      Strength   Overall Strength Comments  WFL's bilat UE's - pain with testing       Palpation   Spinal mobility  tenderness and tightness with CPA mobs through the thoracic and cervical spine     Palpation comment  significant muscular tightness through the pecs; anterior deltoid; biceps; upper trap; leveator; teres Rt > Lt                 Objective measurements completed on examination: See above findings.      [facility] Adult PT Treatment/Exercise - 04/04/18 0001      Self-Care   Self-Care  --   ergonomics for sitting; TENS for pain management      Neuro Re-ed    Neuro Re-ed Details   initiated postural correctiion engaging posterior  shoulder girdle       Shoulder Exercises: Standing   Other Standing Exercises  axial extension 10 sec x 5; scap squeeze 10 sec x 10; L's x 10; W's x 10 with swim noodle        Shoulder Exercises: Stretch   Other Shoulder Stretches  supine prolonged snow angel for pec stretch/biceps stretch 1-2 min x 2 reps       Moist Heat Therapy   Number Minutes Moist Heat  20 Minutes    Moist Heat Location  Cervical      Cryotherapy   Number Minutes Cryotherapy  20 Minutes    Cryotherapy Location  Shoulder   bilat    Type of Cryotherapy  Ice pack      Electrical Stimulation   Electrical Stimulation Location  Rt shoulder girdle     Electrical Stimulation Action  IFC    Electrical Stimulation Parameters  to tolerance    Electrical Stimulation Goals  Pain;Tone             PT Education - 04/04/18 1150    Education Details  HEP TENS ionto     Person(s) Educated  Patient    Methods  Explanation;Demonstration;Tactile cues;Verbal cues;Handout    Comprehension  Verbalized understanding;Returned demonstration;Verbal cues required;Tactile cues required          PT Long Term Goals - 04/04/18 1211      PT LONG TERM GOAL #1   Title  Improve posture and alignment with patient to demonstrate improved upright posture with posterior shoulder girdle engaged 05/16/2018    Time  6    Period  Weeks    Status  New      PT LONG TERM GOAL #2   Title  decrease pain in the Rt shoulder girdle by 50-75% allowing patient to use Rt UE for functional activities 05/16/2018    Time  6    Period  Weeks    Status  New      PT LONG TERM GOAL #3   Title  Patient demostrates increased AROM Rt shoulder by 5-7 degrees in flexion and abduction with no pain with movement 05/16/2018    Time  6    Period  Weeks    Status  New      PT LONG TERM GOAL #4   Title  Independent in HEP 05/16/2018    Time  6    Period  Weeks    Status  New      PT LONG TERM GOAL #5   Title  Improve FOTO to </+ 32% limitation 05/16/2018    Time  6    Period  Weeks    Status  New             Plan - 04/04/18 1206    Clinical Impression Statement  [name] presents with ~ 6 week history of Rt shoulder pain.  Symptoms started after a move into shoulder retraction and horizontal abduction. Patient has poor posture and alignment; limited cervical and shoulder ROM; pain with active ROM and functional activities; muscular tightness and pain with palpation through the cervical spine and bilat shoulder girdles; pain limiting functional activities. Patient will benefit from PT to address problems identified.     History and Personal Factors relevant to plan of care:  multiple musculoskeletal problems involving cervical and lumbar spine; bilat shoulders; bilat feet     Clinical Presentation  Evolving    Clinical Presentation due to:  sedentary work postures;  sedentary lifestyle     Clinical Decision Making  Low    Rehab Potential  Good    PT Frequency  3x / week    PT Duration  6 weeks    PT Treatment/Interventions  Patient/family education;ADLs/Self Care Home Management;Cryotherapy;Electrical Stimulation;Iontophoresis 60m/ml Dexamethasone;Moist Heat;Ultrasound;Dry needling;Manual techniques;Neuromuscular re-education;Therapeutic activities;Therapeutic exercise    PT Next Visit Plan  review HEP; add gentle stretch for pecs; posterior shoulder rolls; biceps stretch; trial of manual work Rt shoulder girdle; trial of ionto anterior Rt shoulder; modalities as indicated     PT Home Exercise Plan   Access Code: [email or web address]     Consulted and Agree with Plan of Care  Patient       Patient will benefit from skilled therapeutic intervention in order to improve the following deficits and impairments:  Postural dysfunction, Improper body mechanics, Pain, Increased fascial restricitons, Increased muscle spasms, Hypomobility, Decreased mobility, Decreased range of motion, Decreased activity tolerance  Visit Diagnosis: Acute pain of right shoulder - Plan: PT plan of care cert/re-cert  Abnormal posture - Plan: PT plan of care cert/re-cert  Other symptoms and signs involving the musculoskeletal system - Plan: PT plan of care  cert/re-cert  Cervicalgia - Plan: PT plan of care cert/re-cert     Problem List Patient Active Problem List   Diagnosis Date Noted  . Migraine with aura and without status migrainosus, not intractable 05/31/2017  . Plantar fasciitis, right 04/12/2017  . Asthma 04/12/2017  . SOB (shortness of breath) 07/12/2016  . Plantar fasciitis, left 11/17/2015  . Fatigue 11/17/2015  . 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  . Type 2 diabetes mellitus with vascular disease ([place] 01/14/2015  . Vitamin D deficiency 01/14/2015  . Morbid obesity ([place] 01/13/2015  . Fatty liver disease, nonalcoholic 083/33/8329 . Peripheral vascular disease ([place] 07/15/2011  . DEPRESSION 08/13/2008  . PERIMENOPAUSAL SYNDROME 05/08/2008  . ALLERGIC RHINITIS 04/10/2008  . Hypothyroidism 08/04/2006  . Dyslipidemia 08/04/2006  . Essential hypertension 08/04/2006    [name] Katherine SimmerPT, MPH  04/04/2018, 12:26 PM  [facility] [address]Phone: [phone]  Fax:  [phone] Name: Katherine FRISCIAMRN: [id number] of Birth: 2Dec 20, 1962

## 2018-04-04 NOTE — Patient Instructions (Addendum)
IONTOPHORESIS PATIENT PRECAUTIONS & CONTRAINDICATIONS:  . Redness under one or both electrodes can occur.  This characterized by a uniform redness that usually disappears within 12 hours of treatment. . Small pinhead size blisters may result in response to the drug.  Contact your physician if the problem persists more than 24 hours. . On rare occasions, iontophoresis therapy can result in temporary skin reactions such as rash, inflammation, irritation or burns.  The skin reactions may be the result of individual sensitivity to the ionic solution used, the condition of the skin at the start of treatment, reaction to the materials in the electrodes, allergies or sensitivity to dexamethasone, or a poor connection between the patch and your skin.  Discontinue using iontophoresis if you have any of these reactions and report to your therapist. . Remove the Patch or electrodes if you have any undue sensation of pain or burning during the treatment and report discomfort to your therapist. . Tell your Therapist if you have had known adverse reactions to the application of electrical current. . If using the Patch, the LED light will turn off when treatment is complete and the patch can be removed.  Approximate treatment time is 1-3 hours.  Remove the patch when light goes off or after 6 hours. . The Patch can be worn during normal activity, however excessive motion where the electrodes have been placed can cause poor contact between the skin and the electrode or uneven electrical current resulting in greater risk of skin irritation. Marland Kitchen Keep out of the reach of children.   . DO NOT use if you have a cardiac pacemaker or any other electrically sensitive implanted device. . DO NOT use if you have a known sensitivity to dexamethasone. . DO NOT use during Magnetic Resonance Imaging (MRI). . DO NOT use over broken or compromised skin (e.g. sunburn, cuts, or acne) due to the increased risk of skin reaction. . DO  NOT SHAVE over the area to be treated:  To establish good contact between the Patch and the skin, excessive hair may be clipped. . DO NOT place the Patch or electrodes on or over your eyes, directly over your heart, or brain. . DO NOT reuse the Patch or electrodes as this may cause burns to occur.   Access Code: [email or web address]  URL: [email or web address]  Date: 04/04/2018  Prepared by: Gillermo Murdoch   Exercises  Seated Cervical Retraction - 5 reps - 1 sets - 3x daily - 7x weekly  Standing Scapular Retraction - 10 reps - 1 sets - 10 hold - 3x daily - 7x weekly  Shoulder External Rotation and Scapular Retraction - 10 reps - 1 sets - hold - 3x daily - 7x weekly  Standing Scapular Retraction in Abduction - 10 reps - 1 sets - 3x daily - 7x weekly  Hooklying Shoulder T - 1 reps - 1 sets - 90 sec hold - 3x daily - 7x weekly  Patient Education  TENS Unit

## 2018-04-06 ENCOUNTER — Other Ambulatory Visit: Payer: Self-pay | Admitting: Family Medicine

## 2018-04-09 ENCOUNTER — Ambulatory Visit: Payer: PRIVATE HEALTH INSURANCE | Admitting: Physical Therapy

## 2018-04-09 ENCOUNTER — Encounter (INDEPENDENT_AMBULATORY_CARE_PROVIDER_SITE_OTHER): Payer: Self-pay | Admitting: Orthopedic Surgery

## 2018-04-09 ENCOUNTER — Encounter: Payer: Self-pay | Admitting: Physical Therapy

## 2018-04-09 DIAGNOSIS — M25511 Pain in right shoulder: Secondary | ICD-10-CM

## 2018-04-09 DIAGNOSIS — R29898 Other symptoms and signs involving the musculoskeletal system: Secondary | ICD-10-CM

## 2018-04-09 DIAGNOSIS — M542 Cervicalgia: Secondary | ICD-10-CM

## 2018-04-09 DIAGNOSIS — R293 Abnormal posture: Secondary | ICD-10-CM

## 2018-04-09 NOTE — Progress Notes (Signed)
Office Visit Note   Patient: [name]           Date of Birth: 23-Dec-1960           MRN: [id number] Visit Date: 04/03/2018              Requested by: Gregor Hams, MD [address] [place], [place] [address] PCP: Gregor Hams, MD  Chief Complaint  Patient presents with  . Right Foot - Follow-up      HPI: Patient is a 58 year old woman with Achilles contracture and plantar fasciitis on the right.  Patient is undergone prolonged conservative therapy still has persistent pain and would like to consider surgical intervention.  Assessment & Plan: Visit Diagnoses:  1. Achilles tendon contracture, right   2. Plantar fasciitis, right     Plan: We will set her up for surgical intervention with a gastrocnemius recession and plantar fascial release risks and benefits were discussed patient does have asthma and we will plan on surgery at [facility].  Follow-Up Instructions: Return in about 2 weeks (around 04/17/2018).   Ortho Exam  Patient is alert, oriented, no adenopathy, well-dressed, normal affect, normal respiratory effort. Examination patient's foot is neurovascular intact she has dorsiflexion short of neutral with her knee extended she is tender to palpation of the origin of the plantar fascia the tarsal tunnel is nontender to palpation.  Imaging: No results found. No images are attached to the encounter.  Labs: Lab Results  Component Value Date   HGBA1C 6.8 (A) 12/22/2017   HGBA1C 6.6 05/31/2017   HGBA1C 6.4 (H) 02/24/2017     Lab Results  Component Value Date   ALBUMIN 4.1 07/25/2016   ALBUMIN 4.3 12/14/2015   ALBUMIN 4.2 11/17/2015    Body mass index is 38.28 kg/m.  Orders:  No orders of the defined types were placed in this encounter.  No orders of the defined types were placed in this encounter.    Procedures: No procedures performed  Clinical Data: No additional findings.  ROS:  All other systems negative, except as noted in the  HPI. Review of Systems  Objective: Vital Signs: Ht 5' 4"  (1.626 m)   Wt 223 lb (101.2 kg)   BMI 38.28 kg/m   Specialty Comments:  No specialty comments available.  PMFS History: Patient Active Problem List   Diagnosis Date Noted  . Migraine with aura and without status migrainosus, not intractable 05/31/2017  . Plantar fasciitis, right 04/12/2017  . Asthma 04/12/2017  . SOB (shortness of breath) 07/12/2016  . Plantar fasciitis, left 11/17/2015  . Fatigue 11/17/2015  . 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  . Type 2 diabetes mellitus with vascular disease ([place]) 01/14/2015  . Vitamin D deficiency 01/14/2015  . Morbid obesity ([place]) 01/13/2015  . Fatty liver disease, nonalcoholic 96/78/9381  . Peripheral vascular disease ([place]) 07/15/2011  . DEPRESSION 08/13/2008  . PERIMENOPAUSAL SYNDROME 05/08/2008  . ALLERGIC RHINITIS 04/10/2008  . Hypothyroidism 08/04/2006  . Dyslipidemia 08/04/2006  . Essential hypertension 08/04/2006   Past Medical History:  Diagnosis Date  . ALLERGIC RHINITIS 04/10/2008  . Carpal tunnel syndrome of right wrist   . DEPRESSION 08/13/2008  . Diabetes mellitus without complication ([place])   . HYPERLIPIDEMIA 08/04/2006  . HYPERTENSION 08/04/2006  . HYPOTHYROIDISM 08/04/2006  . LOW BACK PAIN 08/04/2006  . Neuromuscular disorder ([place])   . PERIMENOPAUSAL SYNDROME 05/08/2008  . PVD (peripheral vascular disease) ([place])    occlusive, status post bifem bypass 2007  Family History  Problem Relation Age of Onset  . Hypertension Mother   . Heart disease Father   . Hyperlipidemia Father   . Hypertension Father   . Alcohol abuse Brother   . Diabetes Maternal Grandmother   . Alcohol abuse Maternal Uncle   . Stroke Maternal Uncle     Past Surgical History:  Procedure Laterality Date  . CARPAL TUNNEL RELEASE Right 04/05/2016   Procedure: OPEN RIGHT CARPAL TUNNEL RELEASE;  Surgeon: Jessy Oto, MD;  Location: [place];  Service:  Orthopedics;  Laterality: Right;  . CHOLECYSTECTOMY    . FEMORAL BYPASS     bifem.  Marland Kitchen HAGLAND'S DEFORMITY EXCISION    . TUBAL LIGATION     Social History   Occupational History  . Not on file  Tobacco Use  . Smoking status: Former Smoker    Last attempt to quit: 02/15/2004    Years since quitting: 14.1  . Smokeless tobacco: Never Used  Substance and Sexual Activity  . Alcohol use: No  . Drug use: No  . Sexual activity: Yes    Birth control/protection: Post-menopausal

## 2018-04-09 NOTE — Patient Instructions (Signed)
Access Code: [email or web address]  URL: [email or web address]  Date: 04/09/2018  Prepared by: Kerin Perna   Exercises  Seated Cervical Retraction - 5 reps - 1 sets - 3x daily - 7x weekly  Standing Scapular Retraction - 10 reps - 1 sets - 10 hold - 3x daily - 7x weekly  Shoulder External Rotation and Scapular Retraction - 10 reps - 1 sets - hold - 3x daily - 7x weekly  Standing Scapular Retraction in Abduction - 10 reps - 1 sets - 3x daily - 7x weekly  Hooklying Shoulder T - 1 reps - 1 sets - 90 sec hold - 3x daily - 7x weekly  Doorway Pec Stretch at 60 Elevation - 2-3 reps - 1 sets - 15-30 seconds hold - 2x daily - 7x weekly  Doorway Pec Stretch at 90 Degrees Abduction - 2-3 reps - 1 sets - 15-30 seconds hold - 2x daily - 7x weekly  Doorway Pec Stretch at 120 Degrees Abduction - 2-3 reps - 1 sets - 15-30 seconds hold - 2x daily - 7x weekly  Doorway Pec Stretch at 60 Elevation - 2-3 reps - 1 sets - 15-30 seconds hold - 2x daily - 7x weekly  Standing Shoulder Extension with Dowel - 10 reps - 1 sets - 5 hold - 1x daily - 7x weekly  Patient Education  TENS Unit

## 2018-04-09 NOTE — Therapy (Signed)
[place] [address] [place], [place], [address] Phone: [phone]   Fax:  [phone]  Physical Therapy Treatment  Patient Details  Name: [name] MRN: [id number] Date of Birth: 02/02/1961 Referring Provider (PT): Dr Basil Dess    Encounter Date: 04/09/2018  PT End of Session - 04/09/18 0932    Visit Number  2    Number of Visits  18    Date for PT Re-Evaluation  05/16/18    PT Start Time  0932    PT Stop Time  1027    PT Time Calculation (min)  55 min    Activity Tolerance  Patient tolerated treatment well    Behavior During Therapy  [facility] for tasks assessed/performed       Past Medical History:  Diagnosis Date  . ALLERGIC RHINITIS 04/10/2008  . Carpal tunnel syndrome of right wrist   . DEPRESSION 08/13/2008  . Diabetes mellitus without complication ([place])   . HYPERLIPIDEMIA 08/04/2006  . HYPERTENSION 08/04/2006  . HYPOTHYROIDISM 08/04/2006  . LOW BACK PAIN 08/04/2006  . Neuromuscular disorder ([place])   . PERIMENOPAUSAL SYNDROME 05/08/2008  . PVD (peripheral vascular disease) ([place])    occlusive, status post bifem bypass 2007    Past Surgical History:  Procedure Laterality Date  . CARPAL TUNNEL RELEASE Right 04/05/2016   Procedure: OPEN RIGHT CARPAL TUNNEL RELEASE;  Surgeon: Jessy Oto, MD;  Location: [place];  Service: Orthopedics;  Laterality: Right;  . CHOLECYSTECTOMY    . FEMORAL BYPASS     bifem.  Marland Kitchen HAGLAND'S DEFORMITY EXCISION    . TUBAL LIGATION      There were no vitals filed for this visit.  Subjective Assessment - 04/09/18 0937    Subjective  Pt reports she could barely sit and type the day after last session.  She hasn't done many exercises over the weekend due to being busy.   She continues to rely on ice hot for pain relief.  She ordered a TENS unit and tried it out this weekend.     Patient Stated Goals  get shoulders moving so she can exercise again - and be pain free  in the shoulders     Currently in Pain?  Yes    Pain Score  3     Pain Location  Neck    Pain Orientation  Left    Pain Descriptors / Indicators  Aching    Pain Radiating Towards  into Lt shoulder and head     Aggravating Factors   reaching, lifting; moving the wrong way    Pain Relieving Factors  icy hot; ice; meds         [facility] PT Assessment - 04/09/18 0001      Assessment   Medical Diagnosis  Rt shoulder pain; AC joint arthritis; bicesps tendonitis; cervical dysfunction     Referring Provider (PT)  Dr Basil Dess     Onset Date/Surgical Date  02/14/18    Hand Dominance  Right    Next MD Visit  04/23/2018    Prior Therapy  yes        [facility] Adult PT Treatment/Exercise - 04/09/18 0001      Self-Care   Self-Care  Other Self-Care Comments    Other Self-Care Comments   Pt educated on self massage with ball and theracane to decrease fascial tightness and pain; pt returned demo with cues.       Exercises   Exercises  Shoulder;Neck      Neck Exercises: Standing   Other Standing Exercises  back against pool noodle:  scap retraction x 10 reps of 5 sec; L's x 5 sec x 5 reps; W's x 5 sec x 5 reps       Neck Exercises: Seated   Neck Retraction  5 reps;5 secs   tactile cues to correct technique   Shoulder Rolls  Backwards;5 reps   2 sets     Shoulder Exercises: Stretch   Other Shoulder Stretches  3 position doorway stretch x 20 sec x 2 reps each position.  bilat shoulder ext with cane x 5 sec x 10 reps     Other Shoulder Stretches  supine prolonged stretch with arms abdct 90 deg x 1 min x 2 reps      Moist Heat Therapy   Number Minutes Moist Heat  15 Minutes    Moist Heat Location  --   upper thoracic     Cryotherapy   Number Minutes Cryotherapy  15 Minutes    Cryotherapy Location  Shoulder   Lt   Type of Cryotherapy  Ice pack      Electrical Stimulation   Electrical Stimulation Location  Lt shoulder and neck     Electrical Stimulation Action  IFC    Electrical  Stimulation Parameters  intensity to pt tolerance     Electrical Stimulation Goals  Tone;Pain      Neck Exercises: Stretches   Upper Trapezius Stretch  Left;Right;2 reps;20 seconds    Levator Stretch  Right;3 reps;10 seconds             PT Education - 04/09/18 1019    Education Details  HEP - updated    Person(s) Educated  Patient    Methods  Explanation;Handout;Demonstration;Verbal cues    Comprehension  Returned demonstration;Verbalized understanding          PT Long Term Goals - 04/04/18 1211      PT LONG TERM GOAL #1   Title  Improve posture and alignment with patient to demonstrate improved upright posture with posterior shoulder girdle engaged 05/16/2018    Time  6    Period  Weeks    Status  New      PT LONG TERM GOAL #2   Title  decrease pain in the Rt shoulder girdle by 50-75% allowing patient to use Rt UE for functional activities 05/16/2018    Time  6    Period  Weeks    Status  New      PT LONG TERM GOAL #3   Title  Patient demostrates increased AROM Rt shoulder by 5-7 degrees in flexion and abduction with no pain with movement 05/16/2018    Time  6    Period  Weeks    Status  New      PT LONG TERM GOAL #4   Title  Independent in HEP 05/16/2018    Time  6    Period  Weeks    Status  New      PT LONG TERM GOAL #5   Title  Improve FOTO to </+ 32% limitation 05/16/2018    Time  6    Period  Weeks    Status  New            Plan - 04/09/18 8127    Clinical Impression Statement  Pt initially reported increased discomfort with shoulder rolls; improved tolerance after performing scap retraction exercise.  Pt tolerated all  other exercises well, without increase in pain in UE / neck.  Pt did report increased Rt foot pain if standing too long.  Pt reported decrease in pain in neck and shoulder pain with stretches and further reduction with estim/ice at end.  Goals are ongoing at this time.     Rehab Potential  Good    PT Frequency  3x / week    PT Duration   6 weeks    PT Treatment/Interventions  Patient/family education;ADLs/Self Care Home Management;Cryotherapy;Electrical Stimulation;Iontophoresis 66m/ml Dexamethasone;Moist Heat;Ultrasound;Dry needling;Manual techniques;Neuromuscular re-education;Therapeutic activities;Therapeutic exercise    PT Next Visit Plan  manual work to shoulder girdle and neck; continue postural strengthening/stretching.     PT Home Exercise Plan   Access Code: [email or web address]     Consulted and Agree with Plan of Care  Patient       Patient will benefit from skilled therapeutic intervention in order to improve the following deficits and impairments:  Postural dysfunction, Improper body mechanics, Pain, Increased fascial restricitons, Increased muscle spasms, Hypomobility, Decreased mobility, Decreased range of motion, Decreased activity tolerance  Visit Diagnosis: Acute pain of right shoulder  Abnormal posture  Other symptoms and signs involving the musculoskeletal system  Cervicalgia     Problem List Patient Active Problem List   Diagnosis Date Noted  . Migraine with aura and without status migrainosus, not intractable 05/31/2017  . Plantar fasciitis, right 04/12/2017  . Asthma 04/12/2017  . SOB (shortness of breath) 07/12/2016  . Plantar fasciitis, left 11/17/2015  . Fatigue 11/17/2015  . 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  . Type 2 diabetes mellitus with vascular disease ([place] 01/14/2015  . Vitamin D deficiency 01/14/2015  . Morbid obesity ([place] 01/13/2015  . Fatty liver disease, nonalcoholic 011/15/5208 . Peripheral vascular disease ([place] 07/15/2011  . DEPRESSION 08/13/2008  . PERIMENOPAUSAL SYNDROME 05/08/2008  . ALLERGIC RHINITIS 04/10/2008  . Hypothyroidism 08/04/2006  . Dyslipidemia 08/04/2006  . Essential hypertension 08/04/2006   JKerin Perna PTA 04/09/18 10:41 AM [facility]  [address]Phone: [phone]  Fax:  [phone] Name: Katherine DEUPREEMRN: [id number] of Birth: [birth year]

## 2018-04-11 ENCOUNTER — Encounter: Payer: Self-pay | Admitting: Rehabilitative and Restorative Service Providers"

## 2018-04-11 ENCOUNTER — Ambulatory Visit (INDEPENDENT_AMBULATORY_CARE_PROVIDER_SITE_OTHER): Payer: Self-pay | Admitting: Rehabilitative and Restorative Service Providers"

## 2018-04-11 DIAGNOSIS — R293 Abnormal posture: Secondary | ICD-10-CM

## 2018-04-11 DIAGNOSIS — R29898 Other symptoms and signs involving the musculoskeletal system: Secondary | ICD-10-CM

## 2018-04-11 DIAGNOSIS — M25511 Pain in right shoulder: Secondary | ICD-10-CM

## 2018-04-11 DIAGNOSIS — M542 Cervicalgia: Secondary | ICD-10-CM

## 2018-04-11 NOTE — Therapy (Signed)
[place], [place], [address] Phone: [phone]   Fax:  [phone]  Physical Therapy Treatment  Patient Details  Name: [name] MRN: [id number] Date of Birth: 06-Oct-1960 Referring Provider (PT): Dr Basil Dess    Encounter Date: 04/11/2018  PT End of Session - 04/11/18 1519    Visit Number  3    Number of Visits  18    Date for PT Re-Evaluation  05/16/18    PT Start Time  6606    PT Stop Time  1615    PT Time Calculation (min)  59 min    Activity Tolerance  Patient tolerated treatment well       Past Medical History:  Diagnosis Date  . ALLERGIC RHINITIS 04/10/2008  . Carpal tunnel syndrome of right wrist   . DEPRESSION 08/13/2008  . Diabetes mellitus without complication ([place])   . HYPERLIPIDEMIA 08/04/2006  . HYPERTENSION 08/04/2006  . HYPOTHYROIDISM 08/04/2006  . LOW BACK PAIN 08/04/2006  . Neuromuscular disorder ([place])   . PERIMENOPAUSAL SYNDROME 05/08/2008  . PVD (peripheral vascular disease) ([place])    occlusive, status post bifem bypass 2007    Past Surgical History:  Procedure Laterality Date  . CARPAL TUNNEL RELEASE Right 04/05/2016   Procedure: OPEN RIGHT CARPAL TUNNEL RELEASE;  Surgeon: Jessy Oto, MD;  Location: [facility];  Service: Orthopedics;  Laterality: Right;  . CHOLECYSTECTOMY    . FEMORAL BYPASS     bifem.  Marland Kitchen HAGLAND'S DEFORMITY EXCISION    . TUBAL LIGATION      There were no vitals filed for this visit.  Subjective Assessment - 04/11/18 1521    Subjective  Increased tightness in the neck and shoulder and pain following last treatment. Treated wit hice and TENS and it felt much better yesterday and she feels good today. Ready to get the foot surgery over with.     Currently in Pain?  Yes    Pain Score  3     Pain Location  Neck    Pain Orientation  Left    Pain Descriptors / Indicators  Aching    Pain Type  Acute pain    Pain Onset  More than a  month ago    Pain Frequency  Intermittent                       [facility] Adult PT Treatment/Exercise - 04/11/18 0001      Neuro Re-ed    Neuro Re-ed Details   continued with postural correction and education       Shoulder Exercises: Standing   Other Standing Exercises  axial extension 10 sec x 5; scap squeeze 10 sec x 10; L's x 10; W's x 10 with swim noodle       Shoulder Exercises: Stretch   Other Shoulder Stretches  3 position doorway stretch x 20 sec x 2 reps each position.  bilat shoulder ext with cane x 5 sec x 10 reps     Other Shoulder Stretches  supine prolonged stretch with arms abdct 90 deg x 1 min x 2 reps      Moist Heat Therapy   Number Minutes Moist Heat  20 Minutes    Moist Heat Location  Cervical;Shoulder      Electrical Stimulation   Electrical Stimulation Location  Lt shoulder and neck     Electrical Stimulation Action  IFC    Electrical Stimulation  Parameters  to tolerance     Electrical Stimulation Goals  Tone;Pain      Manual Therapy   Manual therapy comments  pt supine hooklying     Soft tissue mobilization  through bilat cervical musculature - ant/lat/post (tightness noted through the Lt > Rt SCM/scalenes); bilat anterior shoulders     Myofascial Release  upper trap     Passive ROM  gentle PROM cervical lateral flexion; flexion     Manual Traction  10-20 sec pull x 3-4 during treatment              PT Education - 04/11/18 1601    Education Details  HEP DN     Person(s) Educated  Patient    Methods  Explanation    Comprehension  Verbalized understanding          PT Long Term Goals - 04/04/18 1211      PT LONG TERM GOAL #1   Title  Improve posture and alignment with patient to demonstrate improved upright posture with posterior shoulder girdle engaged 05/16/2018    Time  6    Period  Weeks    Status  New      PT LONG TERM GOAL #2   Title  decrease pain in the Rt shoulder girdle by 50-75% allowing patient to use Rt UE for  functional activities 05/16/2018    Time  6    Period  Weeks    Status  New      PT LONG TERM GOAL #3   Title  Patient demostrates increased AROM Rt shoulder by 5-7 degrees in flexion and abduction with no pain with movement 05/16/2018    Time  6    Period  Weeks    Status  New      PT LONG TERM GOAL #4   Title  Independent in HEP 05/16/2018    Time  6    Period  Weeks    Status  New      PT LONG TERM GOAL #5   Title  Improve FOTO to </+ 32% limitation 05/16/2018    Time  6    Period  Weeks    Status  New            Plan - 04/11/18 1521    Clinical Impression Statement  Decreased pain; continued muscular tightness through the Lt > Rt upper quarter. Good response to manual work followed by modalities. may consider DN after surgery for foot if tightness persists.     Rehab Potential  Good    PT Frequency  3x / week    PT Duration  6 weeks    PT Treatment/Interventions  Patient/family education;ADLs/Self Care Home Management;Cryotherapy;Electrical Stimulation;Iontophoresis 88m/ml Dexamethasone;Moist Heat;Ultrasound;Dry needling;Manual techniques;Neuromuscular re-education;Therapeutic activities;Therapeutic exercise    PT Next Visit Plan  manual work to shoulder girdle and neck; continue postural strengthening/stretching.     PT Home Exercise Plan   Access Code: [email or web address]     Consulted and Agree with Plan of Care  Patient       Patient will benefit from skilled therapeutic intervention in order to improve the following deficits and impairments:  Postural dysfunction, Improper body mechanics, Pain, Increased fascial restricitons, Increased muscle spasms, Hypomobility, Decreased mobility, Decreased range of motion, Decreased activity tolerance  Visit Diagnosis: Acute pain of right shoulder  Abnormal posture  Other symptoms and signs involving the musculoskeletal system  Cervicalgia     Problem List Patient Active Problem List  Diagnosis Date Noted  . Migraine with aura  and without status migrainosus, not intractable 05/31/2017  . Plantar fasciitis, right 04/12/2017  . Asthma 04/12/2017  . SOB (shortness of breath) 07/12/2016  . Plantar fasciitis, left 11/17/2015  . Fatigue 11/17/2015  . 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  . Type 2 diabetes mellitus with vascular disease ([place]) 01/14/2015  . Vitamin D deficiency 01/14/2015  . Morbid obesity ([place]) 01/13/2015  . Fatty liver disease, nonalcoholic 22/84/0698  . Peripheral vascular disease ([place]) 07/15/2011  . DEPRESSION 08/13/2008  . PERIMENOPAUSAL SYNDROME 05/08/2008  . ALLERGIC RHINITIS 04/10/2008  . Hypothyroidism 08/04/2006  . Dyslipidemia 08/04/2006  . Essential hypertension 08/04/2006    Maykayla Highley Nilda Simmer PT, MPH  04/11/2018, 4:03 PM  [facility] [place], [place], [address] Phone: [phone]   Fax:  [phone]  Name: LIBA HULSEY MRN: [id number] Date of Birth: Mar 25, 1960

## 2018-04-11 NOTE — Patient Instructions (Signed)

## 2018-04-13 ENCOUNTER — Encounter: Payer: Self-pay | Admitting: Physical Therapy

## 2018-04-13 ENCOUNTER — Ambulatory Visit (INDEPENDENT_AMBULATORY_CARE_PROVIDER_SITE_OTHER): Payer: Self-pay | Admitting: Physician Assistant

## 2018-04-16 ENCOUNTER — Encounter (HOSPITAL_COMMUNITY): Payer: Self-pay

## 2018-04-16 ENCOUNTER — Ambulatory Visit (INDEPENDENT_AMBULATORY_CARE_PROVIDER_SITE_OTHER): Payer: Self-pay | Admitting: Physical Therapy

## 2018-04-16 DIAGNOSIS — R29898 Other symptoms and signs involving the musculoskeletal system: Secondary | ICD-10-CM

## 2018-04-16 DIAGNOSIS — R293 Abnormal posture: Secondary | ICD-10-CM

## 2018-04-16 DIAGNOSIS — M542 Cervicalgia: Secondary | ICD-10-CM

## 2018-04-16 DIAGNOSIS — M25511 Pain in right shoulder: Secondary | ICD-10-CM

## 2018-04-16 NOTE — Therapy (Addendum)
[place], [place], [address] Phone: [phone]   Fax:  [phone]  Physical Therapy Treatment  Patient Details  Name: [name] MRN: [id number] Date of Birth: 10/31/1960 Referring Provider (PT): Dr Basil Dess    Encounter Date: 04/16/2018  PT End of Session - 04/16/18 1253    Visit Number  4    Number of Visits  18    Date for PT Re-Evaluation  05/16/18    PT Start Time  1151    PT Stop Time  1235    PT Time Calculation (min)  44 min    Activity Tolerance  Patient tolerated treatment well    Behavior During Therapy  [facility] for tasks assessed/performed       Past Medical History:  Diagnosis Date  . ALLERGIC RHINITIS 04/10/2008  . Carpal tunnel syndrome of right wrist   . DEPRESSION 08/13/2008  . Diabetes mellitus without complication ([place])   . HYPERLIPIDEMIA 08/04/2006  . HYPERTENSION 08/04/2006  . HYPOTHYROIDISM 08/04/2006  . LOW BACK PAIN 08/04/2006  . Neuromuscular disorder ([place])   . PERIMENOPAUSAL SYNDROME 05/08/2008  . PVD (peripheral vascular disease) ([place])    occlusive, status post bifem bypass 2007    Past Surgical History:  Procedure Laterality Date  . CARPAL TUNNEL RELEASE Right 04/05/2016   Procedure: OPEN RIGHT CARPAL TUNNEL RELEASE;  Surgeon: Jessy Oto, MD;  Location: [place];  Service: Orthopedics;  Laterality: Right;  . CHOLECYSTECTOMY    . FEMORAL BYPASS     bifem.  Marland Kitchen HAGLAND'S DEFORMITY EXCISION    . TUBAL LIGATION      There were no vitals filed for this visit.  Subjective Assessment - 04/16/18 1154    Subjective  "I had a little pain (in my neck) when I woke up, but I let it work its way out".  "My foot is really killing me".  She has noticed she is waking up with her Rt arm above her head.   She reports her Rt shoulder pain is 90% better.     Currently in Pain?  Yes    Pain Score  4     Pain Location  Foot    Pain Orientation  Right    Pain  Descriptors / Indicators  Tightness;Aching    Aggravating Factors   standing    Pain Relieving Factors  non WB.          [facility] PT Assessment - 04/16/18 0001      Assessment   Medical Diagnosis  Rt shoulder pain; AC joint arthritis; bicesps tendonitis; cervical dysfunction     Referring Provider (PT)  Dr Basil Dess     Onset Date/Surgical Date  02/14/18    Hand Dominance  Right    Next MD Visit  04/23/2018    Prior Therapy  yes       AROM   Right Shoulder Extension  48 Degrees    Right Shoulder Flexion  150 Degrees    Right Shoulder ABduction  135 Degrees   with pain   Right Shoulder External Rotation  90 Degrees    Left Shoulder Extension  52 Degrees    Left Shoulder Flexion  148 Degrees    Left Shoulder ABduction  150 Degrees    Left Shoulder External Rotation  90 Degrees    Cervical - Right Side Bend  30    Cervical - Left Side Bend  25  Cervical - Right Rotation  55    Cervical - Left Rotation  57       [facility] Adult PT Treatment/Exercise - 04/16/18 0001      Neck Exercises: Supine   Neck Retraction  5 reps;3 secs    Other Supine Exercise  scap retraction x 5 sec x 10 reps       Shoulder Exercises: Stretch   Other Shoulder Stretches  supine prolonged stretch with arms abdct 90 deg x 1 min x 4 reps; trial of snow angels x 5 reps - painful; stopped.       Modalities   Modalities  Electrical Stimulation;Moist Heat;Iontophoresis      Moist Heat Therapy   Number Minutes Moist Heat  15 Minutes    Moist Heat Location  Cervical;Shoulder      Electrical Stimulation   Electrical Stimulation Location  mid thoracic paraspinals and bilat upper trap     Electrical Stimulation Action  premod to each area    Electrical Stimulation Parameters  intensity to pt tolerance    Electrical Stimulation Goals  Pain      Iontophoresis   Type of Iontophoresis  Dexamethasone    Location  Rt ant shoulder (bicep)    Dose  1.0 cc    Time  80 mA stat patch; 6 hr wear time.       Manual  Therapy   Manual therapy comments  pt supine hooklying     Soft tissue mobilization  through bilat cervical musculature - ant/lat/post (tightness noted through the Lt > Rt SCM/scalenes); bilat anterior shoulders     Myofascial Release  upper trap     Manual Traction  10-20 sec pull x 3-4 during treatment       Neck Exercises: Stretches   Upper Trapezius Stretch  Left;Right;2 reps;20 seconds                  PT Long Term Goals - 04/16/18 1158      PT LONG TERM GOAL #1   Title  Improve posture and alignment with patient to demonstrate improved upright posture with posterior shoulder girdle engaged 05/16/2018    Time  6    Period  Weeks    Status  On-going      PT LONG TERM GOAL #2   Title  decrease pain in the Rt shoulder girdle by 50-75% allowing patient to use Rt UE for functional activities 05/16/2018    Time  6    Period  Weeks    Status  Achieved      PT LONG TERM GOAL #3   Title  Patient demostrates increased AROM Rt shoulder by 5-7 degrees in flexion and abduction with no pain with movement 05/16/2018    Time  6    Period  Weeks    Status  On-going      PT LONG TERM GOAL #4   Title  Independent in HEP 05/16/2018    Time  6    Period  Weeks    Status  On-going      PT LONG TERM GOAL #5   Title  Improve FOTO to </+ 32% limitation 05/16/2018    Time  6    Period  Weeks    Status  On-going            Plan - 04/16/18 1234    Clinical Impression Statement  Pt reporting 90% improvement in Rt shoulder pain and function; has met LTG#2.  Her ROM of her neck has improved, however bilat shoulder ROM continues to be limited with discomfort at end range. Pt has partially met her goals and is gradually progressing towards remaining.  Pt will have surgery this week on her foot; will return to therapy once able.     Rehab Potential  Good    PT Frequency  3x / week    PT Duration  6 weeks    PT Treatment/Interventions  Patient/family education;ADLs/Self Care Home  Management;Cryotherapy;Electrical Stimulation;Iontophoresis 89m/ml Dexamethasone;Moist Heat;Ultrasound;Dry needling;Manual techniques;Neuromuscular re-education;Therapeutic activities;Therapeutic exercise    PT Next Visit Plan  manual work to shoulder girdle and neck; continue postural strengthening/stretching.  Assess response to ionto patch   PT Home Exercise Plan   Access Code: [email or web address]     Consulted and Agree with Plan of Care  Patient       Patient will benefit from skilled therapeutic intervention in order to improve the following deficits and impairments:  Postural dysfunction, Improper body mechanics, Pain, Increased fascial restricitons, Increased muscle spasms, Hypomobility, Decreased mobility, Decreased range of motion, Decreased activity tolerance  Visit Diagnosis: Acute pain of right shoulder  Abnormal posture  Other symptoms and signs involving the musculoskeletal system  Cervicalgia     Problem List Patient Active Problem List   Diagnosis Date Noted  . Migraine with aura and without status migrainosus, not intractable 05/31/2017  . Plantar fasciitis, right 04/12/2017  . Asthma 04/12/2017  . SOB (shortness of breath) 07/12/2016  . Plantar fasciitis, left 11/17/2015  . Fatigue 11/17/2015  . 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  . Type 2 diabetes mellitus with vascular disease ([place] 01/14/2015  . Vitamin D deficiency 01/14/2015  . Morbid obesity ([place] 01/13/2015  . Fatty liver disease, nonalcoholic 038/25/0539 . Peripheral vascular disease ([place] 07/15/2011  . DEPRESSION 08/13/2008  . PERIMENOPAUSAL SYNDROME 05/08/2008  . ALLERGIC RHINITIS 04/10/2008  . Hypothyroidism 08/04/2006  . Dyslipidemia 08/04/2006  . Essential hypertension 08/04/2006   JKerin Perna PTA 04/16/18 1:04 PM  [place] [address]Phone: [phone]  Fax:  [phone] Name:  Katherine FREESMRN: [id number] of Birth: [birth year] PHYSICAL THERAPY DISCHARGE SUMMARY  Visits from Start of Care: 4  Current functional level related to goals / functional outcomes: Improving with PT - underwent surgery for plantar fasciitis and reported that she was doing well. Requested d/c from PT    Remaining deficits: Unknown    Education / Equipment: HEP  Plan: Patient agrees to discharge.  Patient goals were partially met. Patient is being discharged due to the patient's request.  ?????

## 2018-04-17 NOTE — Anesthesia Preprocedure Evaluation (Addendum)
Anesthesia Evaluation  Patient identified by MRN, date of birth, ID band Patient awake    Reviewed: Allergy & Precautions, NPO status , Patient's Chart, lab work & pertinent test results  Airway Mallampati: II  TM Distance: >3 FB     Dental   Pulmonary asthma , former smoker,    breath sounds clear to auscultation       Cardiovascular hypertension, + Peripheral Vascular Disease   Rhythm:Regular Rate:Normal     Neuro/Psych    GI/Hepatic negative GI ROS, (+) Hepatitis -  Endo/Other  diabetesHypothyroidism   Renal/GU Renal disease     Musculoskeletal   Abdominal   Peds  Hematology   Anesthesia Other Findings   Reproductive/Obstetrics                            Anesthesia Physical Anesthesia Plan  ASA: III  Anesthesia Plan: General   Post-op Pain Management:    Induction: Intravenous  PONV Risk Score and Plan: 2 and Ondansetron, Dexamethasone and Midazolam  Airway Management Planned: Oral ETT  Additional Equipment:   Intra-op Plan:   Post-operative Plan: Possible Post-op intubation/ventilation  Informed Consent: I have reviewed the patients History and Physical, chart, labs and discussed the procedure including the risks, benefits and alternatives for the proposed anesthesia with the patient or authorized representative who has indicated his/her understanding and acceptance.     Dental advisory given  Plan Discussed with: Anesthesiologist and CRNA  Anesthesia Plan Comments:        Anesthesia Quick Evaluation

## 2018-04-18 ENCOUNTER — Ambulatory Visit (HOSPITAL_COMMUNITY): Payer: PRIVATE HEALTH INSURANCE | Admitting: Anesthesiology

## 2018-04-18 ENCOUNTER — Other Ambulatory Visit: Payer: Self-pay

## 2018-04-18 ENCOUNTER — Encounter (HOSPITAL_COMMUNITY)
Admission: RE | Disposition: A | Discharge status: Home / Self Care | Payer: Self-pay | Source: Home / Self Care | Attending: Orthopedic Surgery

## 2018-04-18 ENCOUNTER — Ambulatory Visit (HOSPITAL_COMMUNITY)
Admission: RE | Admit: 2018-04-18 | Discharge: 2018-04-18 | Disposition: A | Discharge status: Home / Self Care | Payer: PRIVATE HEALTH INSURANCE | Attending: Orthopedic Surgery | Admitting: Orthopedic Surgery

## 2018-04-18 ENCOUNTER — Other Ambulatory Visit (INDEPENDENT_AMBULATORY_CARE_PROVIDER_SITE_OTHER): Payer: Self-pay | Admitting: Specialist

## 2018-04-18 ENCOUNTER — Encounter (HOSPITAL_COMMUNITY): Payer: Self-pay

## 2018-04-18 DIAGNOSIS — E1151 Type 2 diabetes mellitus with diabetic peripheral angiopathy without gangrene: Secondary | ICD-10-CM | POA: Insufficient documentation

## 2018-04-18 DIAGNOSIS — J45909 Unspecified asthma, uncomplicated: Secondary | ICD-10-CM | POA: Insufficient documentation

## 2018-04-18 DIAGNOSIS — E785 Hyperlipidemia, unspecified: Secondary | ICD-10-CM | POA: Insufficient documentation

## 2018-04-18 DIAGNOSIS — Z87891 Personal history of nicotine dependence: Secondary | ICD-10-CM | POA: Insufficient documentation

## 2018-04-18 DIAGNOSIS — M6701 Short Achilles tendon (acquired), right ankle: Secondary | ICD-10-CM | POA: Diagnosis not present

## 2018-04-18 DIAGNOSIS — I10 Essential (primary) hypertension: Secondary | ICD-10-CM | POA: Diagnosis not present

## 2018-04-18 DIAGNOSIS — E039 Hypothyroidism, unspecified: Secondary | ICD-10-CM | POA: Diagnosis not present

## 2018-04-18 DIAGNOSIS — M722 Plantar fascial fibromatosis: Secondary | ICD-10-CM | POA: Diagnosis not present

## 2018-04-18 HISTORY — DX: Unspecified injury of unspecified kidney, initial encounter: S37.009A

## 2018-04-18 HISTORY — DX: Unspecified asthma, uncomplicated: J45.909

## 2018-04-18 HISTORY — DX: Inflammatory liver disease, unspecified: K75.9

## 2018-04-18 HISTORY — PX: PLANTAR FASCIA RELEASE: SHX2239

## 2018-04-18 LAB — GLUCOSE, CAPILLARY
Glucose-Capillary: 134 mg/dL — ABNORMAL HIGH (ref 70–99)
Glucose-Capillary: 162 mg/dL — ABNORMAL HIGH (ref 70–99)

## 2018-04-18 LAB — COMPREHENSIVE METABOLIC PANEL
ALBUMIN: 3.7 g/dL (ref 3.5–5.0)
ALT: 31 U/L (ref 0–44)
AST: 22 U/L (ref 15–41)
Alkaline Phosphatase: 116 U/L (ref 38–126)
Anion gap: 11 (ref 5–15)
BUN: 11 mg/dL (ref 6–20)
CO2: 23 mmol/L (ref 22–32)
CREATININE: 0.92 mg/dL (ref 0.44–1.00)
Calcium: 9.3 mg/dL (ref 8.9–10.3)
Chloride: 104 mmol/L (ref 98–111)
GFR calc Af Amer: >60 mL/min (ref >60)
GFR calc non Af Amer: >60 mL/min (ref >60)
Glucose, Bld: 125 mg/dL — ABNORMAL HIGH (ref 70–99)
Potassium: 3.7 mmol/L (ref 3.5–5.1)
Sodium: 138 mmol/L (ref 135–145)
Total Bilirubin: 0.6 mg/dL (ref 0.3–1.2)
Total Protein: 7.4 g/dL (ref 6.5–8.1)

## 2018-04-18 LAB — CBC
HCT: 48.7 % — ABNORMAL HIGH (ref 36.0–46.0)
Hemoglobin: 16 g/dL — ABNORMAL HIGH (ref 12.0–15.0)
MCH: 30.5 pg (ref 26.0–34.0)
MCHC: 32.9 g/dL (ref 30.0–36.0)
MCV: 92.8 fL (ref 80.0–100.0)
NRBC: 0 % (ref 0.0–0.2)
Platelets: 203 10*3/uL (ref 150–400)
RBC: 5.25 MIL/uL — ABNORMAL HIGH (ref 3.87–5.11)
RDW: 12.1 % (ref 11.5–15.5)
WBC: 10.9 10*3/uL — AB (ref 4.0–10.5)

## 2018-04-18 LAB — TSH: TSH: 1.74 u[IU]/mL (ref 0.350–4.500)

## 2018-04-18 SURGERY — RELEASE, FASCIA, PLANTAR
Anesthesia: General | Site: Leg Lower | Laterality: Right

## 2018-04-18 MED ORDER — CEFAZOLIN SODIUM-DEXTROSE 2-4 GM/100ML-% IV SOLN
2.0000 g | INTRAVENOUS | Status: AC
  Administered 2018-04-18: 2 g via INTRAVENOUS
  Filled 2018-04-18: qty 100

## 2018-04-18 MED ORDER — PROPOFOL 10 MG/ML IV BOLUS
INTRAVENOUS | Status: AC
  Filled 2018-04-18: qty 40

## 2018-04-18 MED ORDER — DEXAMETHASONE SODIUM PHOSPHATE 10 MG/ML IJ SOLN
INTRAMUSCULAR | Status: DC | PRN
  Administered 2018-04-18: 5 mg via INTRAVENOUS

## 2018-04-18 MED ORDER — ALBUTEROL SULFATE HFA 108 (90 BASE) MCG/ACT IN AERS
INHALATION_SPRAY | RESPIRATORY_TRACT | Status: DC | PRN
  Administered 2018-04-18: 3 via RESPIRATORY_TRACT

## 2018-04-18 MED ORDER — FENTANYL CITRATE (PF) 100 MCG/2ML IJ SOLN
25.0000 ug | INTRAMUSCULAR | Status: DC | PRN
  Administered 2018-04-18 (×2): 25 ug via INTRAVENOUS
  Administered 2018-04-18: 50 ug via INTRAVENOUS

## 2018-04-18 MED ORDER — BUPIVACAINE HCL (PF) 0.25 % IJ SOLN
INTRAMUSCULAR | Status: AC
  Filled 2018-04-18: qty 30

## 2018-04-18 MED ORDER — FENTANYL CITRATE (PF) 100 MCG/2ML IJ SOLN
INTRAMUSCULAR | Status: AC
  Administered 2018-04-18: 25 ug via INTRAVENOUS
  Filled 2018-04-18: qty 2

## 2018-04-18 MED ORDER — DEXAMETHASONE SODIUM PHOSPHATE 10 MG/ML IJ SOLN
INTRAMUSCULAR | Status: AC
  Filled 2018-04-18: qty 1

## 2018-04-18 MED ORDER — ACETAMINOPHEN-CODEINE #3 300-30 MG PO TABS: 1.0000 | ORAL_TABLET | ORAL | 0 refills | Status: DC | PRN

## 2018-04-18 MED ORDER — 0.9 % SODIUM CHLORIDE (POUR BTL) OPTIME
TOPICAL | Status: DC | PRN
  Administered 2018-04-18: 1000 mL

## 2018-04-18 MED ORDER — LIDOCAINE 2% (20 MG/ML) 5 ML SYRINGE
INTRAMUSCULAR | Status: DC | PRN
  Administered 2018-04-18: 40 mg via INTRAVENOUS

## 2018-04-18 MED ORDER — FENTANYL CITRATE (PF) 250 MCG/5ML IJ SOLN
INTRAMUSCULAR | Status: DC | PRN
  Administered 2018-04-18: 50 ug via INTRAVENOUS

## 2018-04-18 MED ORDER — ONDANSETRON HCL 4 MG/2ML IJ SOLN
INTRAMUSCULAR | Status: DC | PRN
  Administered 2018-04-18: 4 mg via INTRAVENOUS

## 2018-04-18 MED ORDER — FENTANYL CITRATE (PF) 250 MCG/5ML IJ SOLN
INTRAMUSCULAR | Status: AC
  Filled 2018-04-18: qty 5

## 2018-04-18 MED ORDER — PROPOFOL 10 MG/ML IV BOLUS
INTRAVENOUS | Status: DC | PRN
  Administered 2018-04-18: 200 mg via INTRAVENOUS

## 2018-04-18 MED ORDER — ONDANSETRON HCL 4 MG/2ML IJ SOLN
INTRAMUSCULAR | Status: AC
  Filled 2018-04-18: qty 2

## 2018-04-18 MED ORDER — MIDAZOLAM HCL 5 MG/5ML IJ SOLN
INTRAMUSCULAR | Status: DC | PRN
  Administered 2018-04-18: 2 mg via INTRAVENOUS

## 2018-04-18 MED ORDER — LACTATED RINGERS IV SOLN
INTRAVENOUS | Status: DC | PRN
  Administered 2018-04-18: 08:00:00 via INTRAVENOUS

## 2018-04-18 MED ORDER — CHLORHEXIDINE GLUCONATE 4 % EX LIQD: 60.0000 mL | Freq: Once | CUTANEOUS | Status: DC

## 2018-04-18 MED ORDER — MIDAZOLAM HCL 2 MG/2ML IJ SOLN
INTRAMUSCULAR | Status: AC
  Filled 2018-04-18: qty 2

## 2018-04-18 SURGICAL SUPPLY — 33 items
BLADE MINI RND TIP GREEN BEAV (BLADE) ×1 IMPLANT
BNDG COHESIVE 6X5 TAN STRL LF (GAUZE/BANDAGES/DRESSINGS) ×2 IMPLANT
BNDG GAUZE ELAST 4 BULKY (GAUZE/BANDAGES/DRESSINGS) ×1 IMPLANT
CHLORAPREP W/TINT 26ML (MISCELLANEOUS) ×2 IMPLANT
COVER MAYO STAND STRL (DRAPES) ×2 IMPLANT
COVER SURGICAL LIGHT HANDLE (MISCELLANEOUS) ×4 IMPLANT
COVER WAND RF STERILE (DRAPES) ×2 IMPLANT
DRAPE U-SHAPE 47X51 STRL (DRAPES) ×2 IMPLANT
DRSG EMULSION OIL 3X3 NADH (GAUZE/BANDAGES/DRESSINGS) ×2 IMPLANT
DRSG PAD ABDOMINAL 8X10 ST (GAUZE/BANDAGES/DRESSINGS) ×1 IMPLANT
DURAPREP 26ML APPLICATOR (WOUND CARE) ×2 IMPLANT
ELECT REM PT RETURN 9FT ADLT (ELECTROSURGICAL) ×2
ELECTRODE REM PT RTRN 9FT ADLT (ELECTROSURGICAL) ×1 IMPLANT
GAUZE SPONGE 4X4 12PLY STRL (GAUZE/BANDAGES/DRESSINGS) ×2 IMPLANT
GLOVE BIO SURGEON STRL SZ 6 (GLOVE) ×1 IMPLANT
GLOVE BIOGEL PI IND STRL 9 (GLOVE) ×1 IMPLANT
GLOVE BIOGEL PI INDICATOR 9 (GLOVE) ×1
GLOVE SURG ORTHO 9.0 STRL STRW (GLOVE) ×2 IMPLANT
GOWN STRL REUS W/ TWL XL LVL3 (GOWN DISPOSABLE) ×2 IMPLANT
GOWN STRL REUS W/TWL XL LVL3 (GOWN DISPOSABLE) ×4
KIT BASIN OR (CUSTOM PROCEDURE TRAY) ×2 IMPLANT
KIT TURNOVER KIT B (KITS) ×2 IMPLANT
NDL SPNL 18GX3.5 QUINCKE PK (NEEDLE) IMPLANT
NEEDLE SPNL 18GX3.5 QUINCKE PK (NEEDLE) ×2 IMPLANT
PACK ORTHO EXTREMITY (CUSTOM PROCEDURE TRAY) ×2 IMPLANT
PAD ARMBOARD 7.5X6 YLW CONV (MISCELLANEOUS) ×4 IMPLANT
PADDING CAST ABS 4INX4YD NS (CAST SUPPLIES) ×1
PADDING CAST ABS COTTON 4X4 ST (CAST SUPPLIES) ×1 IMPLANT
SPONGE LAP 18X18 RF (DISPOSABLE) ×2 IMPLANT
SUT ETHILON 2 0 PSLX (SUTURE) ×2 IMPLANT
TOWEL GREEN STERILE FF (TOWEL DISPOSABLE) ×2 IMPLANT
UNDERPAD 30X30 (UNDERPADS AND DIAPERS) ×2 IMPLANT
WATER STERILE IRR 1000ML POUR (IV SOLUTION) ×2 IMPLANT

## 2018-04-18 NOTE — Anesthesia Procedure Notes (Addendum)
Anesthesia Regional Block: Popliteal block   Pre-Anesthetic Checklist: ,, timeout performed, Correct Patient, Correct Site, Correct Laterality, Correct Procedure, Correct Position, site marked, Risks and benefits discussed,  Surgical consent,  Pre-op evaluation,  At surgeon's request and post-op pain management  Laterality: Right  Prep: chloraprep       Needles:   Needle Type: Echogenic Stimulator Needle          Additional Needles:   Procedures: Doppler guided, nerve stimulator,,, ultrasound used (permanent image in chart),,,,   Nerve Stimulator or Paresthesia:  Response: 0.5 mA,   Additional Responses:   Narrative:  Start time: 04/18/2018 8:00 AM End time: 04/18/2018 8:15 AM Injection made incrementally with aspirations every 5 mL.  Performed by: Personally  Anesthesiologist: Belinda Block, MD

## 2018-04-18 NOTE — H&P (Signed)
[name] is an 58 y.o. female.   Chief Complaint: right achilles and plantar fascia pain HPI: Patient is a 58 year old woman with Achilles contracture and plantar fasciitis on the right.  Patient is undergone prolonged conservative therapy still has persistent pain and would like to consider surgical intervention.  Past Medical History:  Diagnosis Date  . ALLERGIC RHINITIS 04/10/2008  . Asthma    asthmatic bronchitis  . Carpal tunnel syndrome of right wrist   . DEPRESSION 08/13/2008  . Diabetes mellitus without complication ([place])   . Hepatitis    fatty liver  . HYPERLIPIDEMIA 08/04/2006  . HYPERTENSION 08/04/2006  . HYPOTHYROIDISM 08/04/2006  . Kidney injury   . LOW BACK PAIN 08/04/2006  . Neuromuscular disorder ([place])   . PERIMENOPAUSAL SYNDROME 05/08/2008  . PVD (peripheral vascular disease) ([place])    occlusive, status post bifem bypass 2007    Past Surgical History:  Procedure Laterality Date  . CARPAL TUNNEL RELEASE Right 04/05/2016   Procedure: OPEN RIGHT CARPAL TUNNEL RELEASE;  Surgeon: Jessy Oto, MD;  Location: [place];  Service: Orthopedics;  Laterality: Right;  . CHOLECYSTECTOMY    . FEMORAL BYPASS     bifem.  Marland Kitchen HAGLAND'S DEFORMITY EXCISION    . TUBAL LIGATION      Family History  Problem Relation Age of Onset  . Hypertension Mother   . Heart disease Father   . Hyperlipidemia Father   . Hypertension Father   . Alcohol abuse Brother   . Diabetes Maternal Grandmother   . Alcohol abuse Maternal Uncle   . Stroke Maternal Uncle    Social History:  reports that she quit smoking about 14 years ago. She has never used smokeless tobacco. She reports that she does not drink alcohol or use drugs.  Allergies:  Allergies  Allergen Reactions  . Influenza Virus Vacc Split Pf Swelling and Other (See Comments)    Swelling around injection site  . Gluten Meal Diarrhea  . Lipitor [Atorvastatin] Other (See Comments)    Dizziness.     No medications  prior to admission.    No results found for this or any previous visit (from the past 48 hour(s)). No results found.  Review of Systems  All other systems reviewed and are negative.   There were no vitals taken for this visit. Physical Exam  Patient is alert, oriented, no adenopathy, well-dressed, normal affect, normal respiratory effort. Examination patient's foot is neurovascular intact she has dorsiflexion short of neutral with her knee extended she is tender to palpation of the origin of the plantar fascia the tarsal tunnel is nontender to palpation. Assessment/Plan 1. Achilles tendon contracture, right   2. Plantar fasciitis, right     Plan: We will set her up for surgical intervention with a gastrocnemius recession and plantar fascial release risks and benefits were discussed patient does have asthma and we will plan on surgery at [facility].   Newt Minion, MD 04/18/2018, 6:31 AM

## 2018-04-18 NOTE — Anesthesia Procedure Notes (Signed)
Procedure Name: LMA Insertion Date/Time: 04/18/2018 8:41 AM Performed by: Belinda Block, MD Pre-anesthesia Checklist: Patient identified, Emergency Drugs available, Suction available and Patient being monitored Patient Re-evaluated:Patient Re-evaluated prior to induction Oxygen Delivery Method: Circle system utilized Preoxygenation: Pre-oxygenation with 100% oxygen Induction Type: IV induction Ventilation: Mask ventilation without difficulty LMA: LMA inserted LMA Size: 4.0 Number of attempts: 1 Placement Confirmation: positive ETCO2 and breath sounds checked- equal and bilateral Tube secured with: Tape Dental Injury: Teeth and Oropharynx as per pre-operative assessment

## 2018-04-18 NOTE — Telephone Encounter (Signed)
methylPREDNISolone refill request

## 2018-04-18 NOTE — Transfer of Care (Signed)
Immediate Anesthesia Transfer of Care Note  Patient: [name]  Procedure(s) Performed: PLANTAR FASCIA RELEASE AND GASTROCNEMIUS RECESSION RIGHT (Right Leg Lower)  Patient Location: PACU  Anesthesia Type:GA combined with regional for post-op pain  Level of Consciousness: sedated  Airway & Oxygen Therapy: Patient Spontanous Breathing and Patient connected to nasal cannula oxygen  Post-op Assessment: Report given to RN and Post -op Vital signs reviewed and stable  Post vital signs: Reviewed and stable  Last Vitals:  Vitals Value Taken Time  BP 104/59 04/18/2018  9:02 AM  Temp    Pulse 67 04/18/2018  9:05 AM  Resp 15 04/18/2018  9:05 AM  SpO2 100 % 04/18/2018  9:05 AM  Vitals shown include unvalidated device data.  Last Pain:  Vitals:   04/18/18 0716  TempSrc:   PainSc: 0-No pain         Complications: No apparent anesthesia complications

## 2018-04-18 NOTE — Op Note (Signed)
04/18/2018  9:00 AM  PATIENT:  [name]    PRE-OPERATIVE DIAGNOSIS:  Achilles Contracture and Plantar Fasciitis Right Foot  POST-OPERATIVE DIAGNOSIS:  Same  PROCEDURE:  PLANTAR FASCIA RELEASE AND GASTROCNEMIUS RECESSION RIGHT  SURGEON:  Newt Minion, MD  PHYSICIAN ASSISTANT: April Green ANESTHESIA:   General  PREOPERATIVE INDICATIONS:  [name] is a  58 y.o. female with a diagnosis of Achilles Contracture and Plantar Fasciitis Right Foot who failed conservative measures and elected for surgical management.    The risks benefits and alternatives were discussed with the patient preoperatively including but not limited to the risks of infection, bleeding, nerve injury, cardiopulmonary complications, the need for revision surgery, among others, and the patient was willing to proceed.  OPERATIVE IMPLANTS: None  @ENCIMAGES @  OPERATIVE FINDINGS: Contracture of the gastrocnemius muscle  OPERATIVE PROCEDURE: Patient was brought the operating room and underwent a popliteal block.  She then underwent a general anesthetic.  After adequate levels anesthesia were obtained patient's right lower extremity was prepped using ChloraPrep draped into a sterile field a timeout was called.  A medial longitudinal incision was made over the calf 15 cm proximal to the medial malleolus.  Blunt dissection was carried down to the gastrocnemius fascia and under direct visualization the gastrocnemius fascia was released.  This took her dorsiflexion for about 10 degrees short of neutral to about 20 degrees past neutral.  The wound was irrigated with normal saline incision was closed using 2-0 nylon.  Under C-arm fluoroscopy an 18-gauge needle was used to localize the origin of the plantar fascia.  A small skin incision was made with a 15 blade knife and a 73 Beaver blade was used to release the origin of the plantar fascia using C-arm fluoroscopy.  The wounds were cleansed sutured with 2-0 nylon and a sterile  dressing was applied.   DISCHARGE PLANNING:  Antibiotic duration: Preoperative antibiotics  Weightbearing: Weightbearing as tolerated in a cam boot  Pain medication: Prescription for Tylenol 3 sent electronically per patient's request  Dressing care/ Wound VAC: Dry dressing until follow-up  Ambulatory devices: Weightbearing as tolerated  Discharge to: Home  Follow-up: In the office 1 week post operative.

## 2018-04-18 NOTE — Progress Notes (Signed)
Orthopedic Tech Progress Note Patient Details:  ZIYANNA TOLIN 1960/11/29 [id number] PACU RN called requesting a Norina Buzzard Boot Ortho Devices Type of Ortho Device: CAM walker Ortho Device/Splint Location: LRE Ortho Device/Splint Interventions: Adjustment, Application, Ordered   Post Interventions Patient Tolerated: Well Instructions Provided: Care of device, Adjustment of device   Janit Pagan 04/18/2018, 9:29 AM

## 2018-04-19 ENCOUNTER — Encounter (HOSPITAL_COMMUNITY): Payer: Self-pay | Admitting: Orthopedic Surgery

## 2018-04-23 ENCOUNTER — Ambulatory Visit (INDEPENDENT_AMBULATORY_CARE_PROVIDER_SITE_OTHER): Payer: 59 | Admitting: Specialist

## 2018-04-24 ENCOUNTER — Encounter: Payer: Self-pay | Admitting: Family Medicine

## 2018-04-25 NOTE — Anesthesia Postprocedure Evaluation (Signed)
Anesthesia Post Note  Patient: [name]  Procedure(s) Performed: PLANTAR FASCIA RELEASE AND GASTROCNEMIUS RECESSION RIGHT (Right Leg Lower)     Patient location during evaluation: PACU Anesthesia Type: General Level of consciousness: awake Pain management: pain level controlled Vital Signs Assessment: post-procedure vital signs reviewed and stable Cardiovascular status: stable Postop Assessment: no apparent nausea or vomiting Anesthetic complications: no    Last Vitals:  Vitals:   04/18/18 0947 04/18/18 0950  BP: 130/82   Pulse: 60 61  Resp: 19 20  Temp:  36.6 C  SpO2: 96% 94%    Last Pain:  Vitals:   04/18/18 0937  TempSrc:   PainSc: 5                  Lanise Mergen

## 2018-05-02 ENCOUNTER — Encounter (INDEPENDENT_AMBULATORY_CARE_PROVIDER_SITE_OTHER): Payer: Self-pay | Admitting: Family

## 2018-05-02 ENCOUNTER — Ambulatory Visit (INDEPENDENT_AMBULATORY_CARE_PROVIDER_SITE_OTHER): Payer: 59 | Admitting: Family

## 2018-05-02 ENCOUNTER — Other Ambulatory Visit: Payer: Self-pay

## 2018-05-02 VITALS — Ht 64.0 in | Wt 222.9 lb

## 2018-05-02 DIAGNOSIS — Z9889 Other specified postprocedural states: Secondary | ICD-10-CM

## 2018-05-02 DIAGNOSIS — M6701 Short Achilles tendon (acquired), right ankle: Secondary | ICD-10-CM

## 2018-05-02 DIAGNOSIS — M722 Plantar fascial fibromatosis: Secondary | ICD-10-CM

## 2018-05-02 NOTE — Progress Notes (Signed)
Office Visit Note   Patient: [name]           Date of Birth: 01/08/1961           MRN: [id number] Visit Date: 05/02/2018              Requested by: Gregor Hams, MD [address] [place], [place] [address] PCP: Gregor Hams, MD  Chief Complaint  Patient presents with  . Right Leg - Routine Post Op    04/18/2018 right lower leg      HPI: Patient is a 58 year old woman who presents 2 weeks status post plantar fascial release and gastrocnemius recession for Achilles contracture and plantar fasciitis.  Patient states she is feeling much better she states she was up cooking and had no pain afterwards.  Assessment & Plan: Visit Diagnoses:  1. Achilles tendon contracture, right   2. Plantar fasciitis, right     Plan: We will harvest the sutures today she was given instructions and demonstrated Achilles stretching.  She may wash with soap and water and start scar massage.  Follow-Up Instructions: Return in about 3 weeks (around 05/23/2018).   Ortho Exam  Patient is alert, oriented, no adenopathy, well-dressed, normal affect, normal respiratory effort. Examination the surgical incisions are well-healed there is no redness no cellulitis no drainage no signs of infection the Achilles is intact.  She has dorsiflexion about 20 degrees past neutral.  Achilles stretching was demonstrated.  Imaging: No results found. No images are attached to the encounter.  Labs: Lab Results  Component Value Date   HGBA1C 6.8 (A) 12/22/2017   HGBA1C 6.6 05/31/2017   HGBA1C 6.4 (H) 02/24/2017     Lab Results  Component Value Date   ALBUMIN 3.7 04/18/2018   ALBUMIN 4.1 07/25/2016   ALBUMIN 4.3 12/14/2015    Body mass index is 38.26 kg/m.  Orders:  No orders of the defined types were placed in this encounter.  No orders of the defined types were placed in this encounter.    Procedures: No procedures performed  Clinical Data: No additional findings.  ROS:  All  other systems negative, except as noted in the HPI. Review of Systems  Objective: Vital Signs: Ht 5' 4"  (1.626 m)   Wt 222 lb 14.2 oz (101.1 kg)   BMI 38.26 kg/m   Specialty Comments:  No specialty comments available.  PMFS History: Patient Active Problem List   Diagnosis Date Noted  . Achilles tendon contracture, right   . Migraine with aura and without status migrainosus, not intractable 05/31/2017  . Plantar fasciitis of right foot 04/12/2017  . Asthma 04/12/2017  . SOB (shortness of breath) 07/12/2016  . Plantar fasciitis, left 11/17/2015  . Fatigue 11/17/2015  . 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  . Type 2 diabetes mellitus with vascular disease ([place]) 01/14/2015  . Vitamin D deficiency 01/14/2015  . Morbid obesity ([place]) 01/13/2015  . Fatty liver disease, nonalcoholic 12/45/8099  . Peripheral vascular disease ([place]) 07/15/2011  . DEPRESSION 08/13/2008  . PERIMENOPAUSAL SYNDROME 05/08/2008  . ALLERGIC RHINITIS 04/10/2008  . Hypothyroidism 08/04/2006  . Dyslipidemia 08/04/2006  . Essential hypertension 08/04/2006   Past Medical History:  Diagnosis Date  . ALLERGIC RHINITIS 04/10/2008  . Asthma    asthmatic bronchitis  . Carpal tunnel syndrome of right wrist   . DEPRESSION 08/13/2008  . Diabetes mellitus without complication ([place])   . Hepatitis    fatty liver  . HYPERLIPIDEMIA 08/04/2006  .  HYPERTENSION 08/04/2006  . HYPOTHYROIDISM 08/04/2006  . Kidney injury   . LOW BACK PAIN 08/04/2006  . Neuromuscular disorder ([place])   . PERIMENOPAUSAL SYNDROME 05/08/2008  . PVD (peripheral vascular disease) ([place])    occlusive, status post bifem bypass 2007    Family History  Problem Relation Age of Onset  . Hypertension Mother   . Heart disease Father   . Hyperlipidemia Father   . Hypertension Father   . Alcohol abuse Brother   . Diabetes Maternal Grandmother   . Alcohol abuse Maternal Uncle   . Stroke Maternal Uncle     Past Surgical History:  Procedure  Laterality Date  . CARPAL TUNNEL RELEASE Right 04/05/2016   Procedure: OPEN RIGHT CARPAL TUNNEL RELEASE;  Surgeon: Jessy Oto, MD;  Location: [place];  Service: Orthopedics;  Laterality: Right;  . CHOLECYSTECTOMY    . FEMORAL BYPASS     bifem.  Marland Kitchen HAGLAND'S DEFORMITY EXCISION    . PLANTAR FASCIA RELEASE Right 04/18/2018   Procedure: PLANTAR FASCIA RELEASE AND GASTROCNEMIUS RECESSION RIGHT;  Surgeon: Newt Minion, MD;  Location: [place];  Service: Orthopedics;  Laterality: Right;  . TUBAL LIGATION     Social History   Occupational History  . Not on file  Tobacco Use  . Smoking status: Former Smoker    Last attempt to quit: 02/15/2004    Years since quitting: 14.2  . Smokeless tobacco: Never Used  Substance and Sexual Activity  . Alcohol use: No  . Drug use: No  . Sexual activity: Yes    Birth control/protection: Post-menopausal

## 2018-05-18 ENCOUNTER — Other Ambulatory Visit: Payer: Self-pay | Admitting: Family Medicine

## 2018-05-22 ENCOUNTER — Telehealth (INDEPENDENT_AMBULATORY_CARE_PROVIDER_SITE_OTHER): Payer: Self-pay | Admitting: Radiology

## 2018-05-22 NOTE — Telephone Encounter (Signed)
Called and spoke with patient, patient answered NO to all pre screening questions.

## 2018-05-23 ENCOUNTER — Telehealth: Payer: Self-pay | Admitting: Physical Therapy

## 2018-05-23 ENCOUNTER — Encounter (INDEPENDENT_AMBULATORY_CARE_PROVIDER_SITE_OTHER): Payer: Self-pay | Admitting: Family

## 2018-05-23 ENCOUNTER — Other Ambulatory Visit: Payer: Self-pay

## 2018-05-23 ENCOUNTER — Ambulatory Visit (INDEPENDENT_AMBULATORY_CARE_PROVIDER_SITE_OTHER): Payer: 59 | Admitting: Family

## 2018-05-23 DIAGNOSIS — M722 Plantar fascial fibromatosis: Secondary | ICD-10-CM

## 2018-05-23 DIAGNOSIS — M6701 Short Achilles tendon (acquired), right ankle: Secondary | ICD-10-CM

## 2018-05-23 NOTE — Telephone Encounter (Signed)
Called patient to inquire how she is doing and if she was interested in continuation of physical therapy (either in clinic or televisit).  Patient reports she had surgery but is doing well, as long as she continues her exercises.  She is not interested in coming into clinic as she is trying to limit exposure in community. She stated she will call back in the future if needing additional visits.   Kerin Perna, PTA 05/23/18 9:32 AM

## 2018-05-23 NOTE — Progress Notes (Signed)
Office Visit Note   Patient: [name]           Date of Birth: Oct 04, 1960           MRN: [id number] Visit Date: 05/23/2018              Requested by: Gregor Hams, MD [address] [place], [place] [address] PCP: Gregor Hams, MD  No chief complaint on file.     HPI: Patient is a 58 year old woman who presents status post plantar fascial release and gastrocnemius recession for Achilles contracture and plantar fasciitis.  Patient states she has returned to her ADLs. Knows she isnt walking as much as she should. Feels much better when she stretches in the am. No pain. No concerns. Yes to stiffness.  Assessment & Plan: Visit Diagnoses:  No diagnosis found.  Plan: encouraged scar massage. Encouraged stretching. Follow up as needed.  Follow-Up Instructions: No follow-ups on file.   Ortho Exam  Patient is alert, oriented, no adenopathy, well-dressed, normal affect, normal respiratory effort. Examination the surgical incisions are well-healed there is no redness no cellulitis no drainage no signs of infection the Achilles is intact.  She has dorsiflexion to neutral.  Achilles stretching was demonstrated.  Imaging: No results found. No images are attached to the encounter.  Labs: Lab Results  Component Value Date   HGBA1C 6.8 (A) 12/22/2017   HGBA1C 6.6 05/31/2017   HGBA1C 6.4 (H) 02/24/2017     Lab Results  Component Value Date   ALBUMIN 3.7 04/18/2018   ALBUMIN 4.1 07/25/2016   ALBUMIN 4.3 12/14/2015    There is no height or weight on file to calculate BMI.  Orders:  No orders of the defined types were placed in this encounter.  No orders of the defined types were placed in this encounter.    Procedures: No procedures performed  Clinical Data: No additional findings.  ROS:  All other systems negative, except as noted in the HPI. Review of Systems  Constitutional: Negative for chills and fever.  Musculoskeletal: Positive for  myalgias. Negative for arthralgias.  Skin: Negative for wound.    Objective: Vital Signs: There were no vitals taken for this visit.  Specialty Comments:  No specialty comments available.  PMFS History: Patient Active Problem List   Diagnosis Date Noted  . Achilles tendon contracture, right   . Migraine with aura and without status migrainosus, not intractable 05/31/2017  . Plantar fasciitis of right foot 04/12/2017  . Asthma 04/12/2017  . SOB (shortness of breath) 07/12/2016  . Plantar fasciitis, left 11/17/2015  . Fatigue 11/17/2015  . 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  . Type 2 diabetes mellitus with vascular disease ([place]) 01/14/2015  . Vitamin D deficiency 01/14/2015  . Morbid obesity ([place]) 01/13/2015  . Fatty liver disease, nonalcoholic 35/36/1443  . Peripheral vascular disease ([place]) 07/15/2011  . DEPRESSION 08/13/2008  . PERIMENOPAUSAL SYNDROME 05/08/2008  . ALLERGIC RHINITIS 04/10/2008  . Hypothyroidism 08/04/2006  . Dyslipidemia 08/04/2006  . Essential hypertension 08/04/2006   Past Medical History:  Diagnosis Date  . ALLERGIC RHINITIS 04/10/2008  . Asthma    asthmatic bronchitis  . Carpal tunnel syndrome of right wrist   . DEPRESSION 08/13/2008  . Diabetes mellitus without complication ([place])   . Hepatitis    fatty liver  . HYPERLIPIDEMIA 08/04/2006  . HYPERTENSION 08/04/2006  . HYPOTHYROIDISM 08/04/2006  . Kidney injury   . LOW BACK PAIN 08/04/2006  . Neuromuscular disorder ([place])   .  PERIMENOPAUSAL SYNDROME 05/08/2008  . PVD (peripheral vascular disease) ([place])    occlusive, status post bifem bypass 2007    Family History  Problem Relation Age of Onset  . Hypertension Mother   . Heart disease Father   . Hyperlipidemia Father   . Hypertension Father   . Alcohol abuse Brother   . Diabetes Maternal Grandmother   . Alcohol abuse Maternal Uncle   . Stroke Maternal Uncle     Past Surgical History:  Procedure Laterality Date  . CARPAL TUNNEL  RELEASE Right 04/05/2016   Procedure: OPEN RIGHT CARPAL TUNNEL RELEASE;  Surgeon: Jessy Oto, MD;  Location: [facility];  Service: Orthopedics;  Laterality: Right;  . CHOLECYSTECTOMY    . FEMORAL BYPASS     bifem.  Marland Kitchen HAGLAND'S DEFORMITY EXCISION    . PLANTAR FASCIA RELEASE Right 04/18/2018   Procedure: PLANTAR FASCIA RELEASE AND GASTROCNEMIUS RECESSION RIGHT;  Surgeon: Newt Minion, MD;  Location: [facility];  Service: Orthopedics;  Laterality: Right;  . TUBAL LIGATION     Social History   Occupational History  . Not on file  Tobacco Use  . Smoking status: Former Smoker    Last attempt to quit: 02/15/2004    Years since quitting: 14.2  . Smokeless tobacco: Never Used  Substance and Sexual Activity  . Alcohol use: No  . Drug use: No  . Sexual activity: Yes    Birth control/protection: Post-menopausal

## 2018-05-24 ENCOUNTER — Other Ambulatory Visit: Payer: Self-pay | Admitting: Family Medicine

## 2018-05-24 DIAGNOSIS — I10 Essential (primary) hypertension: Secondary | ICD-10-CM

## 2018-06-09 ENCOUNTER — Other Ambulatory Visit: Payer: Self-pay | Admitting: Family Medicine

## 2018-06-13 ENCOUNTER — Encounter: Payer: Self-pay | Admitting: Family Medicine

## 2018-08-10 ENCOUNTER — Other Ambulatory Visit: Payer: Self-pay | Admitting: Family Medicine

## 2018-08-19 ENCOUNTER — Other Ambulatory Visit: Payer: Self-pay | Admitting: Family Medicine

## 2018-08-19 DIAGNOSIS — I10 Essential (primary) hypertension: Secondary | ICD-10-CM

## 2018-09-12 ENCOUNTER — Ambulatory Visit (INDEPENDENT_AMBULATORY_CARE_PROVIDER_SITE_OTHER): Payer: 59 | Admitting: Family Medicine

## 2018-09-12 ENCOUNTER — Ambulatory Visit (INDEPENDENT_AMBULATORY_CARE_PROVIDER_SITE_OTHER): Payer: 59

## 2018-09-12 ENCOUNTER — Encounter: Payer: Self-pay | Admitting: Family Medicine

## 2018-09-12 ENCOUNTER — Other Ambulatory Visit: Payer: Self-pay

## 2018-09-12 VITALS — BP 128/87 | HR 64 | Temp 98.2°F | Wt 214.0 lb

## 2018-09-12 DIAGNOSIS — S39012A Strain of muscle, fascia and tendon of lower back, initial encounter: Secondary | ICD-10-CM

## 2018-09-12 MED ORDER — CYCLOBENZAPRINE HCL 10 MG PO TABS: 10.0000 mg | ORAL_TABLET | Freq: Three times a day (TID) | ORAL | 0 refills | Status: DC | PRN

## 2018-09-12 NOTE — Patient Instructions (Signed)
Thank you for coming in today. Use cyclobenzaprine for muscle spasm as needed.  Use a heating pad or TENS unit as needed.  Get xray lspine today.   Keep me updated.  If not improving then we can do more including PT and possibly gabapentin and prednisone.    Lumbosacral Strain Lumbosacral strain is an injury that causes pain in the lower back (lumbosacral spine). This injury usually occurs from overstretching the muscles or ligaments along your spine. A strain can affect one or more muscles or cord-like tissues that connect bones to other bones (ligaments). What are the causes? This condition may be caused by:  A hard, direct hit (blow) to the back.  Excessive stretching of the lower back muscles. This may result from: ? A fall. ? Lifting something heavy. ? Repetitive movements such as bending or crouching. What increases the risk? The following factors may increase your risk of getting this condition:  Participating in sports or activities that involve: ? A sudden twist of the back. ? Pushing or pulling motions.  Being overweight or obese.  Having poor strength and flexibility, especially tight hamstrings or weak muscles in the back or abdomen.  Having too much of a curve in the lower back.  Having a pelvis that is tilted forward. What are the signs or symptoms? The main symptom of this condition is pain in the lower back, at the site of the strain. Pain may extend (radiate) down one or both legs. How is this diagnosed? This condition is diagnosed based on:  Your symptoms.  Your medical history.  A physical exam. ? Your health care provider may push on certain areas of your back to determine the source of your pain. ? You may be asked to bend forward, backward, and side to side to assess the severity of your pain and your range of motion.  Imaging tests, such as: ? X-rays. ? MRI.  How is this treated? Treatment for this condition may include:  Putting heat and  cold on the affected area.  Medicines to help relieve pain and relax your muscles (muscle relaxants).  NSAIDs to help reduce swelling and discomfort. When your symptoms improve, it is important to gradually return to your normal routine as soon as possible to reduce pain, avoid stiffness, and avoid loss of muscle strength. Generally, symptoms should improve within 6 weeks of treatment. However, recovery time varies. Follow these instructions at home: Managing pain, stiffness, and swelling   If directed, put ice on the injured area during the first 24 hours after your strain. ? Put ice in a plastic bag. ? Place a towel between your skin and the bag. ? Leave the ice on for 20 minutes, 2-3 times a day.  If directed, put heat on the affected area as often as told by your health care provider. Use the heat source that your health care provider recommends, such as a moist heat pack or a heating pad. ? Place a towel between your skin and the heat source. ? Leave the heat on for 20-30 minutes. ? Remove the heat if your skin turns bright red. This is especially important if you are unable to feel pain, heat, or cold. You may have a greater risk of getting burned. Activity  Rest and return to your normal activities as told by your health care provider. Ask your health care provider what activities are safe for you.  Avoid activities that take a lot of energy for as long as told  by your health care provider. General instructions  Take over-the-counter and prescription medicines only as told by your health care provider.  Donot drive or use heavy machinery while taking prescription pain medicine.  Do not use any products that contain nicotine or tobacco, such as cigarettes and e-cigarettes. If you need help quitting, ask your health care provider.  Keep all follow-up visits as told by your health care provider. This is important. How is this prevented?  Use correct form when playing sports  and lifting heavy objects.  Use good posture when sitting and standing.  Maintain a healthy weight.  Sleep on a mattress with medium firmness to support your back.  Be safe and responsible while being active to avoid falls.  Do at least 150 minutes of moderate-intensity exercise each week, such as brisk walking or water aerobics. Try a form of exercise that takes stress off your back, such as swimming or stationary cycling.  Maintain physical fitness, including: ? Strength. ? Flexibility. ? Cardiovascular fitness. ? Endurance. Contact a health care provider if:  Your back pain does not improve after 6 weeks of treatment.  Your symptoms get worse. Get help right away if:  Your back pain is severe.  You cannot stand or walk.  You have difficulty controlling when you urinate or when you have a bowel movement.  You feel nauseous or you vomit.  Your feet get very cold.  You have numbness, tingling, weakness, or problems using your arms or legs.  You develop any of the following: ? Shortness of breath. ? Dizziness. ? Pain in your legs. ? Weakness in your buttocks or legs. ? Discoloration of the skin on your toes or legs. This information is not intended to replace advice given to you by your health care provider. Make sure you discuss any questions you have with your health care provider. Document Released: 11/10/2004 Document Revised: 05/25/2018 Document Reviewed: 07/05/2015 Elsevier Patient Education  2020 [employer].

## 2018-09-12 NOTE — Progress Notes (Signed)
[name] is a 58 y.o. female who presents to [facility]: [facility] today for back pain.  Patient notes right low back pain occurring off and on worsening over the last several months.  She occasionally has pain radiating to the right anterior thigh and down to the right leg especially the lateral calf and dorsal foot.  She denies any injury.  She denies any weakness or numbness or bowel bladder dysfunction.  She has been doing a little more activity recently and thinks this may have caused her symptoms to worsen.  She is tried some over-the-counter medications for pain which helps some.   ROS as above:  Exam:  BP 128/87   Pulse 64   Temp 98.2 F (36.8 C) (Oral)   Wt 214 lb (97.1 kg)   BMI 36.73 kg/m  Wt Readings from Last 5 Encounters:  09/12/18 214 lb (97.1 kg)  05/02/18 222 lb 14.2 oz (101.1 kg)  04/18/18 222 lb 14.2 oz (101.1 kg)  04/03/18 223 lb (101.2 kg)  03/26/18 223 lb (101.2 kg)    Gen: Well NAD HEENT: EOMI,  MMM Lungs: Normal work of breathing. CTABL Heart: RRR no MRG Abd: NABS, Soft. Nondistended, Nontender Exts: Brisk capillary refill, warm and well perfused.  L-spine: Nontender to spinal midline.  Mildly tender palpation lumbar paraspinal musculature. Normal lumbar motion mild pain with rotation lateral flexion. Negative slump test bilateral lower extremities. Lower extremity strength reflexes and sensation are equal normal throughout.  Lab and Radiology Results X-ray images lumbar spine obtained today personally and independently reviewed . Mild degenerative changes throughout lumbar spine however most pronounced at L5-S1 with significant DDD.  Facet hypertrophy and mild anterior listhesis grade 1.  Possible bilateral pars defects bilateral resulting in grade 1 spondylolisthesis. Await formal radiology overread  Assessment and Plan: 58 y.o. female  with back pain and right lumbar radicular pain.  X-ray today somewhat concerning for spondylolisthesis.  Patient certainly does have degenerative changes at L5-S1.  She certainly could have neural compression at L5 which could result in a lot of her lower leg pain.  However the majority of her pain is in her low back which is much more likely to be myofascial strain and spasm.  Plan for trial of cyclobenzaprine.  Discussed physical therapy patient would like to try some home exercises first.  If not improving will recheck in near future.  PDMP not reviewed this encounter. Orders Placed This Encounter  Procedures  . DG Lumbar Spine Complete    Standing Status:   Future    Number of Occurrences:   1    Standing Expiration Date:   11/13/2019    Order Specific Question:   Reason for Exam (SYMPTOM  OR DIAGNOSIS REQUIRED)    Answer:   Aayden Cefalu pain lspine    Order Specific Question:   Is patient pregnant?    Answer:   No    Order Specific Question:   Preferred imaging location?    Answer:   [facility]    Order Specific Question:   Radiology Contrast Protocol - do NOT remove file path    Answer:   [email or web address]   Meds ordered this encounter  Medications  . cyclobenzaprine (FLEXERIL) 10 MG tablet    Sig: Take 1 tablet (10 mg total) by mouth 3 (three) times daily as needed for muscle spasms.    Dispense:  90 tablet    Refill:  0  Historical information moved to improve visibility of documentation.  Past Medical History:  Diagnosis Date  . ALLERGIC RHINITIS 04/10/2008  . Asthma    asthmatic bronchitis  . Carpal tunnel syndrome of right wrist   . DEPRESSION 08/13/2008  . Diabetes mellitus without complication ([facility] [facility])   . Hepatitis    fatty liver  . HYPERLIPIDEMIA 08/04/2006  . HYPERTENSION 08/04/2006  . HYPOTHYROIDISM 08/04/2006  . Kidney injury   . LOW BACK PAIN 08/04/2006  . Neuromuscular disorder ([place])   . PERIMENOPAUSAL SYNDROME  05/08/2008  . PVD (peripheral vascular disease) ([place])    occlusive, status post bifem bypass 2007   Past Surgical History:  Procedure Laterality Date  . CARPAL TUNNEL RELEASE Right 04/05/2016   Procedure: OPEN RIGHT CARPAL TUNNEL RELEASE;  Surgeon: Jessy Oto, MD;  Location: [facility];  Service: Orthopedics;  Laterality: Right;  . CHOLECYSTECTOMY    . FEMORAL BYPASS     bifem.  Marland Kitchen HAGLAND'S DEFORMITY EXCISION    . PLANTAR FASCIA RELEASE Right 04/18/2018   Procedure: PLANTAR FASCIA RELEASE AND GASTROCNEMIUS RECESSION RIGHT;  Surgeon: Newt Minion, MD;  Location: [facility];  Service: Orthopedics;  Laterality: Right;  . TUBAL LIGATION     Social History   Tobacco Use  . Smoking status: Former Smoker    Quit date: 02/15/2004    Years since quitting: 14.5  . Smokeless tobacco: Never Used  Substance Use Topics  . Alcohol use: No   family history includes Alcohol abuse in her brother and maternal uncle; Diabetes in her maternal grandmother; Heart disease in her father; Hyperlipidemia in her father; Hypertension in her father and mother; Stroke in her maternal uncle.  Medications: Current Outpatient Medications  Medication Sig Dispense Refill  . albuterol (PROVENTIL HFA;VENTOLIN HFA) 108 (90 Base) MCG/ACT inhaler INHALE 2 PUFFS INTO THE LUNGS EVERY 6 HOURS AS NEEDED FOR WHEEZING OR SHORTNESS OF BREATH (Patient taking differently: Inhale 2 puffs into the lungs every 6 (six) hours as needed for wheezing or shortness of breath. ) 18 g 0  . albuterol (PROVENTIL) (2.5 MG/3ML) 0.083% nebulizer solution Take 3 mLs (2.5 mg total) by nebulization every 6 (six) hours as needed for wheezing or shortness of breath. 150 mL 1  . azelastine (ASTELIN) 0.1 % nasal spray Place 1 spray into the nose daily as needed for rhinitis.     Marland Kitchen azithromycin (ZITHROMAX) 250 MG tablet Take 250 mg by mouth every Monday, Wednesday, and Friday. At night    . benazepril (LOTENSIN) 10 MG tablet TAKE 1 TABLET BY  MOUTH EVERY DAY 30 tablet 0  . dicyclomine (BENTYL) 10 MG capsule Take 1 capsule (10 mg total) by mouth 4 (four) times daily -  before meals and at bedtime. (Patient taking differently: Take 10 mg by mouth 2 (two) times daily. ) 120 capsule 12  . fluticasone furoate-vilanterol (BREO ELLIPTA) 100-25 MCG/INH AEPB Inhale 1 puff into the lungs daily.    Marland Kitchen ibuprofen (ADVIL,MOTRIN) 200 MG tablet Take 400-600 mg by mouth every 6 (six) hours as needed for headache or moderate pain.    Marland Kitchen levothyroxine (SYNTHROID, LEVOTHROID) 88 MCG tablet TAKE 1 TABLET (88 MCG TOTAL) BY MOUTH DAILY. NEEDS APPT 90 tablet 1  . Menthol, Topical Analgesic, (ICY HOT EX) Apply 1 application topically daily as needed (pain).    . metoprolol tartrate (LOPRESSOR) 50 MG tablet Take 1 tablet (50 mg total) by mouth 2 (two) times daily. Due for follow up visit w/PCP 60 tablet 0  .  triamcinolone (NASACORT) 55 MCG/ACT AERO nasal inhaler Place 1 spray into the nose daily as needed (rhinitis).   12  . umeclidinium bromide (INCRUSE ELLIPTA) 62.5 MCG/INH AEPB Inhale 1 puff into the lungs daily.    Marland Kitchen venlafaxine XR (EFFEXOR-XR) 75 MG 24 hr capsule TAKE 1 CAPSULE BY MOUTH EVERY DAY WITH BREAKFAST 90 capsule 1  . acetaminophen-codeine (TYLENOL #3) 300-30 MG tablet Take 1-2 tablets by mouth every 4 (four) hours as needed for moderate pain. 30 tablet 0  . cyclobenzaprine (FLEXERIL) 10 MG tablet Take 1 tablet (10 mg total) by mouth 3 (three) times daily as needed for muscle spasms. 90 tablet 0   No current facility-administered medications for this visit.    Allergies  Allergen Reactions  . Influenza Virus Vacc Split Pf Swelling and Other (See Comments)    Swelling around injection site  . Gluten Meal Diarrhea  . Lipitor [Atorvastatin] Other (See Comments)    Dizziness.      Discussed warning signs or symptoms. Please see discharge instructions. Patient expresses understanding.

## 2018-09-13 ENCOUNTER — Encounter: Payer: Self-pay | Admitting: Family Medicine

## 2018-09-15 ENCOUNTER — Other Ambulatory Visit: Payer: Self-pay | Admitting: Family Medicine

## 2018-09-16 ENCOUNTER — Other Ambulatory Visit: Payer: Self-pay | Admitting: Family Medicine

## 2018-09-25 ENCOUNTER — Other Ambulatory Visit: Payer: Self-pay | Admitting: Family Medicine

## 2018-09-27 ENCOUNTER — Encounter: Payer: Self-pay | Admitting: Family Medicine

## 2018-09-27 ENCOUNTER — Ambulatory Visit (INDEPENDENT_AMBULATORY_CARE_PROVIDER_SITE_OTHER): Payer: 59 | Admitting: Family Medicine

## 2018-09-27 ENCOUNTER — Other Ambulatory Visit: Payer: Self-pay

## 2018-09-27 VITALS — BP 125/77 | HR 68 | Wt 215.0 lb

## 2018-09-27 DIAGNOSIS — M7711 Lateral epicondylitis, right elbow: Secondary | ICD-10-CM

## 2018-09-27 NOTE — Progress Notes (Signed)
[name] is a 58 y.o. female who presents to [facility] today for right elbow pain.  Patient has developed some pain into her right lateral elbow referring down to the dorsal forearm to hand.  This is worse with wrist motion and strength.  She notes she is bumped her elbow a few times and thinks this may be a causal factor.  She denies any bruising or swelling.  Symptoms have been ongoing now for a few weeks.  She is not had much treatment trials yet.  She also has some shoulder pain.  She is been found to have rotator cuff tendinopathy in the past and is currently in the process of doing some exercises for it which are improving.  Also she has been seen previously for low back strain.  She notes this is also improving.  She is happy with how things are going overall.   ROS as above:  Exam:  BP 125/77   Pulse 68   Wt 215 lb (97.5 kg)   BMI 36.90 kg/m  Wt Readings from Last 5 Encounters:  09/27/18 215 lb (97.5 kg)  09/12/18 214 lb (97.1 kg)  05/02/18 222 lb 14.2 oz (101.1 kg)  04/18/18 222 lb 14.2 oz (101.1 kg)  04/03/18 223 lb (101.2 kg)    Gen: Well NAD HEENT: EOMI,  MMM Lungs: Normal work of breathing. CTABL Heart: RRR no MRG Abd: NABS, Soft. Nondistended, Nontender Exts: Brisk capillary refill, warm and well perfused.  Right elbow normal-appearing tender to palpation at lateral epicondyle nontender elsewhere including over radial head. Normal elbow motion. Tender palpation with resisted wrist extension. Strength sensation pulses cap refill and sensation are intact distally.  Lab and Radiology Results No results found for this or any previous visit (from the past 72 hour(s)). No results found.    Assessment and Plan: 58 y.o. female with right elbow pain lateral epicondylitis.  Discussed stretching and strengthening program.  Also reasonable to  use diclofenac gel.  Continue current treatment for back and shoulder pain.  Informed patient that I will be transitioning to sports medicine only in November.  Still happy to see her for sports medicine related issues.  Obviously still happy to communicate with her as needed through my chart.  PDMP not reviewed this encounter. No orders of the defined types were placed in this encounter.  No orders of the defined types were placed in this encounter.    Historical information moved to improve visibility of documentation.  Past Medical History:  Diagnosis Date  . ALLERGIC RHINITIS 04/10/2008  . Asthma    asthmatic bronchitis  . Carpal tunnel syndrome of right wrist   . DEPRESSION 08/13/2008  . Diabetes mellitus without complication ([place])   . Hepatitis    fatty liver  . HYPERLIPIDEMIA 08/04/2006  . HYPERTENSION 08/04/2006  . HYPOTHYROIDISM 08/04/2006  . Kidney injury   . LOW BACK PAIN 08/04/2006  . Neuromuscular disorder ([facility])   . PERIMENOPAUSAL SYNDROME 05/08/2008  . PVD (peripheral vascular disease) ([place])    occlusive, status post bifem bypass 2007   Past Surgical History:  Procedure Laterality Date  . CARPAL TUNNEL RELEASE Right 04/05/2016   Procedure: OPEN RIGHT CARPAL TUNNEL RELEASE;  Surgeon: Jessy Oto, MD;  Location: [facility];  Service: Orthopedics;  Laterality: Right;  . CHOLECYSTECTOMY    . FEMORAL BYPASS     bifem.  Marland Kitchen HAGLAND'S DEFORMITY EXCISION    .  PLANTAR FASCIA RELEASE Right 04/18/2018   Procedure: PLANTAR FASCIA RELEASE AND GASTROCNEMIUS RECESSION RIGHT;  Surgeon: Newt Minion, MD;  Location: [facility];  Service: Orthopedics;  Laterality: Right;  . TUBAL LIGATION     Social History   Tobacco Use  . Smoking status: Former Smoker    Quit date: 02/15/2004    Years since quitting: 14.6  . Smokeless tobacco: Never Used  Substance Use Topics  . Alcohol use: No   family history includes Alcohol abuse in her brother and maternal uncle; Diabetes in  her maternal grandmother; Heart disease in her father; Hyperlipidemia in her father; Hypertension in her father and mother; Stroke in her maternal uncle.  Medications: Current Outpatient Medications  Medication Sig Dispense Refill  . albuterol (PROVENTIL HFA;VENTOLIN HFA) 108 (90 Base) MCG/ACT inhaler INHALE 2 PUFFS INTO THE LUNGS EVERY 6 HOURS AS NEEDED FOR WHEEZING OR SHORTNESS OF BREATH (Patient taking differently: Inhale 2 puffs into the lungs every 6 (six) hours as needed for wheezing or shortness of breath. ) 18 g 0  . albuterol (PROVENTIL) (2.5 MG/3ML) 0.083% nebulizer solution Take 3 mLs (2.5 mg total) by nebulization every 6 (six) hours as needed for wheezing or shortness of breath. 150 mL 1  . azelastine (ASTELIN) 0.1 % nasal spray Place 1 spray into the nose daily as needed for rhinitis.     Marland Kitchen azithromycin (ZITHROMAX) 250 MG tablet Take 250 mg by mouth every Monday, Wednesday, and Friday. At night    . benazepril (LOTENSIN) 10 MG tablet TAKE 1 TABLET BY MOUTH EVERY DAY 30 tablet 0  . cyclobenzaprine (FLEXERIL) 10 MG tablet Take 1 tablet (10 mg total) by mouth 3 (three) times daily as needed for muscle spasms. 90 tablet 0  . fluticasone furoate-vilanterol (BREO ELLIPTA) 100-25 MCG/INH AEPB Inhale 1 puff into the lungs daily.    Marland Kitchen ibuprofen (ADVIL,MOTRIN) 200 MG tablet Take 400-600 mg by mouth every 6 (six) hours as needed for headache or moderate pain.    Marland Kitchen levothyroxine (SYNTHROID) 88 MCG tablet TAKE 1 TABLET (88 MCG TOTAL) BY MOUTH DAILY. NEEDS APPT 30 tablet 0  . Menthol, Topical Analgesic, (ICY HOT EX) Apply 1 application topically daily as needed (pain).    . metoprolol tartrate (LOPRESSOR) 50 MG tablet TAKE 1 TABLET (50 MG TOTAL) BY MOUTH 2 (TWO) TIMES DAILY. DUE FOR FOLLOW UP VISIT W/PCP 30 tablet 0  . triamcinolone (NASACORT) 55 MCG/ACT AERO nasal inhaler Place 1 spray into the nose daily as needed (rhinitis).   12  . umeclidinium bromide (INCRUSE ELLIPTA) 62.5 MCG/INH AEPB Inhale  1 puff into the lungs daily.    Marland Kitchen venlafaxine XR (EFFEXOR-XR) 75 MG 24 hr capsule TAKE 1 CAPSULE BY MOUTH EVERY DAY WITH BREAKFAST 90 capsule 1  . acetaminophen-codeine (TYLENOL #3) 300-30 MG tablet Take 1-2 tablets by mouth every 4 (four) hours as needed for moderate pain. 30 tablet 0  . dicyclomine (BENTYL) 10 MG capsule Take 1 capsule (10 mg total) by mouth 4 (four) times daily -  before meals and at bedtime. (Patient not taking: Reported on 09/27/2018) 120 capsule 12   No current facility-administered medications for this visit.    Allergies  Allergen Reactions  . Influenza Virus Vacc Split Pf Swelling and Other (See Comments)    Swelling around injection site  . Gluten Meal Diarrhea  . Lipitor [Atorvastatin] Other (See Comments)    Dizziness.      Discussed warning signs or symptoms. Please see discharge instructions. Patient  expresses understanding.

## 2018-09-27 NOTE — Patient Instructions (Signed)
Thank you for coming in today.  Use the voltaren gel up to 4x daily as needed.  Use a wrist brace or counterforce brace.  Do the elbow straight stretch Do the wrist up and down slowly with 1 pound exercise.  Remember to keep the elbow straight.  Do about 30 reps 2-3x daily  Continue exercises for shoulder and back.   I will be moving to full time Sports Medicine in [place] starting on November 1st.  You will still be able to see me for your Sports Medicine or Orthopedic needs in the [facility] Location. I will still be part of [facility].    [address] [place], [place] [address]  [phone]  Telephone (phone line will be functional starting in November).  [phone] Fax [phone] Concussion Line  If you want to stay locally for your Sports Medicine issues Dr. Dianah Field here in [place] will be happy to see you.  Additionally Dr. Clearance Coots at [facility] will be happy to see you for sports medicine issues more locally.   For your primary care needs you are welcome to establish care with Dr. Emeterio Reeve.  We are working quickly to hire more physicians to cover the primary care needs however if you cannot get an appointment with Dr. Sheppard Coil in a timely manner [facility] has locations and openings for primary care services nearby.   [facility] at [facility] [address] . [employer] , [place]: [phone] . Behavioral Medicine: [phone] . Fax: [place] at [employer] [address] . [place], [place]: [phone] . Behavioral Medicine: [phone] . Fax: [phone] . Hours (M-F): 7am - Academic librarian At [facility]. [place], [place]: [phone] . Behavioral Medicine: [phone] . Fax: [phone] . Hours (M-F): 8am - Optician, dispensing at  [employer] . [place], [place] Phone: [phone] . Behavioral Medicine: [phone] . Fax: [phone]

## 2018-10-15 ENCOUNTER — Other Ambulatory Visit: Payer: Self-pay | Admitting: Family Medicine

## 2018-10-15 DIAGNOSIS — I10 Essential (primary) hypertension: Secondary | ICD-10-CM

## 2018-10-17 ENCOUNTER — Other Ambulatory Visit: Payer: Self-pay | Admitting: Family Medicine

## 2018-10-25 ENCOUNTER — Other Ambulatory Visit: Payer: Self-pay | Admitting: Family Medicine

## 2018-11-10 ENCOUNTER — Other Ambulatory Visit: Payer: Self-pay | Admitting: Family Medicine

## 2018-11-10 DIAGNOSIS — I10 Essential (primary) hypertension: Secondary | ICD-10-CM

## 2018-11-25 ENCOUNTER — Other Ambulatory Visit: Payer: Self-pay | Admitting: Family Medicine

## 2018-11-30 ENCOUNTER — Ambulatory Visit: Payer: 59 | Admitting: Family Medicine

## 2018-12-03 ENCOUNTER — Ambulatory Visit (INDEPENDENT_AMBULATORY_CARE_PROVIDER_SITE_OTHER): Payer: 59 | Admitting: Family Medicine

## 2018-12-03 ENCOUNTER — Other Ambulatory Visit: Payer: Self-pay

## 2018-12-03 ENCOUNTER — Encounter: Payer: Self-pay | Admitting: Family Medicine

## 2018-12-03 VITALS — BP 131/71 | HR 64 | Temp 98.4°F | Ht 64.0 in | Wt 213.0 lb

## 2018-12-03 DIAGNOSIS — I10 Essential (primary) hypertension: Secondary | ICD-10-CM | POA: Diagnosis not present

## 2018-12-03 DIAGNOSIS — E1159 Type 2 diabetes mellitus with other circulatory complications: Secondary | ICD-10-CM | POA: Diagnosis not present

## 2018-12-03 DIAGNOSIS — J4540 Moderate persistent asthma, uncomplicated: Secondary | ICD-10-CM | POA: Diagnosis not present

## 2018-12-03 DIAGNOSIS — M25511 Pain in right shoulder: Secondary | ICD-10-CM

## 2018-12-03 DIAGNOSIS — E890 Postprocedural hypothyroidism: Secondary | ICD-10-CM

## 2018-12-03 LAB — POCT GLYCOSYLATED HEMOGLOBIN (HGB A1C): Hemoglobin A1C: 6.7 % — AB (ref 4.0–5.6)

## 2018-12-03 MED ORDER — LEVOTHYROXINE SODIUM 88 MCG PO TABS: 88.0000 ug | ORAL_TABLET | Freq: Every day | ORAL | 3 refills | Status: DC

## 2018-12-03 MED ORDER — VENLAFAXINE HCL ER 150 MG PO CP24: 150.0000 mg | ORAL_CAPSULE | Freq: Every day | ORAL | 1 refills | Status: DC

## 2018-12-03 NOTE — Progress Notes (Signed)
[name] is a 58 y.o. female who presents to [facility] today for follow-up diabetes, hypothyroidism, asthma, right shoulder pain.  Diabetes: Doing quite well with lifestyle changes.  Blood sugar typically well controlled.  She denies any polyuria polydipsia.  Asthma: Patient has asthma managed with pulmonology with medications below.  She notes she has been having to use her albuterol inhaler more than usual.  She thinks the masks leak or feel little bit wheezy.  She is happy to continue using mask if it reduces her risk of COVID-19 however.  She takes her controller medications regularly.  She has a history of hypothyroidism which is well managed with levothyroxine.  She takes the medication regularly feels well.  Additionally she has a history of right shoulder pain worsening recently.  She thinks her computer mouse and work from home is irritating her shoulder.  She notes pain with overhead motion reaching back.  She is not had injections for this yet.  She has been doing some home exercise program and stretching which helps some.  She notes that [place] does help.    ROS as above:  Exam:  BP 131/71   Pulse 64   Temp 98.4 F (36.9 C)   Ht 5' 4"  (9.024 m)   Wt 213 lb (96.6 kg)   SpO2 97%   BMI 36.56 kg/m  Wt Readings from Last 5 Encounters:  12/03/18 213 lb (96.6 kg)  09/27/18 215 lb (97.5 kg)  09/12/18 214 lb (97.1 kg)  05/02/18 222 lb 14.2 oz (101.1 kg)  04/18/18 222 lb 14.2 oz (101.1 kg)    Gen: Well NAD HEENT: EOMI,  MMM Lungs: Normal work of breathing. CTABL Heart: RRR no MRG Abd: NABS, Soft. Nondistended, Nontender Exts: Brisk capillary refill, warm and well perfused.  Right shoulder: Normal-appearing not particularly tender. Abduction full range of motion. External rotation normal range of motion. Internal rotation limited to lumbar spine.  Intact strength. Positive Hawkins and Neer's test. Positive empty can test. Negative Yergason's and speeds test. Contralateral left shoulder normal-appearing nontender normal motion normal strength negative impingement testing.  Pulses cap refill sensation are intact bilateral upper extremities.  Lab and Radiology Results Results for orders placed or performed in visit on 12/03/18 (from the past 72 hour(s))  POCT A1C     Status: Abnormal   Collection Time: 12/03/18  3:06 PM  Result Value Ref Range   Hemoglobin A1C 6.7 (A) 4.0 - 5.6 %   HbA1c POC (<> result, manual entry)     HbA1c, POC (prediabetic range)     HbA1c, POC (controlled diabetic range)     No results found.  Procedure: Real-time Ultrasound Guided Injection of right subacromial bursa Device: GE Logiq E   Images permanently stored and available for review in the ultrasound unit. Verbal informed consent obtained.  Discussed risks and benefits of procedure. Warned about infection bleeding damage to structures skin hypopigmentation and fat atrophy among others. Patient expresses understanding and agreement Time-out conducted.   Noted no overlying erythema, induration, or other signs of local infection.   Skin prepped in a sterile fashion.   Local anesthesia: Topical Ethyl chloride.   With sterile technique and under real time ultrasound guidance:  40 mg of Kenalog and 2 mL of Marcaine injected easily.   Completed without difficulty   Pain immediately resolved suggesting accurate placement of the medication.   Advised to call if fevers/chills, erythema, induration,  drainage, or persistent bleeding.   Images permanently stored and available for review in the ultrasound unit.  Impression: Technically successful ultrasound guided injection.       Assessment and Plan: 58 y.o. female with  Diabetes: Well controlled.  Plan to continue current regimen and recheck with new PCP in February or March.  Asthma: [place] with  pulmonology.  Doing reasonably well.  Continue current care.  Hypothyroidism: Doing well TSH checked about 7 months ago.  Plan to recheck in March or April along with basic fasting labs.  Shoulder pain: Subacromial bursitis quite likely.  Treated with injection today.  Plan to proceed with home exercise program as well.  PDMP not reviewed this encounter. Orders Placed This Encounter  Procedures  . POCT A1C   Meds ordered this encounter  Medications  . levothyroxine (SYNTHROID) 88 MCG tablet    Sig: Take 1 tablet (88 mcg total) by mouth daily.    Dispense:  90 tablet    Refill:  3  . venlafaxine XR (EFFEXOR-XR) 150 MG 24 hr capsule    Sig: Take 1 capsule (150 mg total) by mouth daily with breakfast.    Dispense:  90 capsule    Refill:  1     Historical information moved to improve visibility of documentation.  Past Medical History:  Diagnosis Date  . ALLERGIC RHINITIS 04/10/2008  . Asthma    asthmatic bronchitis  . Carpal tunnel syndrome of right wrist   . DEPRESSION 08/13/2008  . Diabetes mellitus without complication ([place])   . Hepatitis    fatty liver  . HYPERLIPIDEMIA 08/04/2006  . HYPERTENSION 08/04/2006  . HYPOTHYROIDISM 08/04/2006  . Kidney injury   . LOW BACK PAIN 08/04/2006  . Neuromuscular disorder ([place])   . PERIMENOPAUSAL SYNDROME 05/08/2008  . PVD (peripheral vascular disease) ([place])    occlusive, status post bifem bypass 2007   Past Surgical History:  Procedure Laterality Date  . CARPAL TUNNEL RELEASE Right 04/05/2016   Procedure: OPEN RIGHT CARPAL TUNNEL RELEASE;  Surgeon: Jessy Oto, MD;  Location: [facility];  Service: Orthopedics;  Laterality: Right;  . CHOLECYSTECTOMY    . FEMORAL BYPASS     bifem.  Marland Kitchen HAGLAND'S DEFORMITY EXCISION    . PLANTAR FASCIA RELEASE Right 04/18/2018   Procedure: PLANTAR FASCIA RELEASE AND GASTROCNEMIUS RECESSION RIGHT;  Surgeon: Newt Minion, MD;  Location: [facility];  Service: Orthopedics;  Laterality: Right;  .  TUBAL LIGATION     Social History   Tobacco Use  . Smoking status: Former Smoker    Quit date: 02/15/2004    Years since quitting: 14.8  . Smokeless tobacco: Never Used  Substance Use Topics  . Alcohol use: No   family history includes Alcohol abuse in her brother and maternal uncle; Diabetes in her maternal grandmother; Heart disease in her father; Hyperlipidemia in her father; Hypertension in her father and mother; Stroke in her maternal uncle.  Medications: Current Outpatient Medications  Medication Sig Dispense Refill  . acetaminophen-codeine (TYLENOL #3) 300-30 MG tablet Take 1-2 tablets by mouth every 4 (four) hours as needed for moderate pain. 30 tablet 0  . albuterol (PROVENTIL HFA;VENTOLIN HFA) 108 (90 Base) MCG/ACT inhaler INHALE 2 PUFFS INTO THE LUNGS EVERY 6 HOURS AS NEEDED FOR WHEEZING OR SHORTNESS OF BREATH (Patient taking differently: Inhale 2 puffs into the lungs every 6 (six) hours as needed for wheezing or shortness of breath. ) 18 g 0  . albuterol (PROVENTIL) (2.5 MG/3ML) 0.083% nebulizer  solution Take 3 mLs (2.5 mg total) by nebulization every 6 (six) hours as needed for wheezing or shortness of breath. 150 mL 1  . azelastine (ASTELIN) 0.1 % nasal spray Place 1 spray into the nose daily as needed for rhinitis.     Marland Kitchen azithromycin (ZITHROMAX) 250 MG tablet Take 250 mg by mouth every Monday, Wednesday, and Friday. At night    . benazepril (LOTENSIN) 10 MG tablet TAKE 1 TABLET BY MOUTH EVERY DAY 30 tablet 0  . cyclobenzaprine (FLEXERIL) 10 MG tablet Take 1 tablet (10 mg total) by mouth 3 (three) times daily as needed for muscle spasms. 90 tablet 0  . dicyclomine (BENTYL) 10 MG capsule Take 1 capsule (10 mg total) by mouth 4 (four) times daily -  before meals and at bedtime. 120 capsule 12  . fluticasone furoate-vilanterol (BREO ELLIPTA) 100-25 MCG/INH AEPB Inhale 1 puff into the lungs daily.    Marland Kitchen ibuprofen (ADVIL,MOTRIN) 200 MG tablet Take 400-600 mg by mouth every 6 (six)  hours as needed for headache or moderate pain.    Marland Kitchen levothyroxine (SYNTHROID) 88 MCG tablet Take 1 tablet (88 mcg total) by mouth daily. 90 tablet 3  . Menthol, Topical Analgesic, (ICY HOT EX) Apply 1 application topically daily as needed (pain).    . metoprolol tartrate (LOPRESSOR) 50 MG tablet Take 1 tablet (50 mg total) by mouth 2 (two) times daily. 60 tablet 2  . triamcinolone (NASACORT) 55 MCG/ACT AERO nasal inhaler Place 1 spray into the nose daily as needed (rhinitis).   12  . umeclidinium bromide (INCRUSE ELLIPTA) 62.5 MCG/INH AEPB Inhale 1 puff into the lungs daily.    Marland Kitchen venlafaxine XR (EFFEXOR-XR) 150 MG 24 hr capsule Take 1 capsule (150 mg total) by mouth daily with breakfast. 90 capsule 1   No current facility-administered medications for this visit.    Allergies  Allergen Reactions  . Influenza Virus Vacc Split Pf Swelling and Other (See Comments)    Swelling around injection site  . Gluten Meal Diarrhea  . Lipitor [Atorvastatin] Other (See Comments)    Hepatis      Discussed warning signs or symptoms. Please see discharge instructions. Patient expresses understanding.

## 2018-12-03 NOTE — Patient Instructions (Addendum)
Thank you for coming in today. Plan to increase the effexor to 125m  Call or go to the ER if you develop a large red swollen joint with extreme pain or oozing puss.   Call or go to the ER if you develop a large red swollen joint with extreme pain or oozing puss.   Recheck in February with Dr MZigmund Daniel

## 2018-12-06 ENCOUNTER — Other Ambulatory Visit: Payer: Self-pay | Admitting: Family Medicine

## 2018-12-06 DIAGNOSIS — I10 Essential (primary) hypertension: Secondary | ICD-10-CM

## 2018-12-30 ENCOUNTER — Other Ambulatory Visit: Payer: Self-pay | Admitting: Family Medicine

## 2018-12-30 DIAGNOSIS — I10 Essential (primary) hypertension: Secondary | ICD-10-CM

## 2019-01-28 ENCOUNTER — Other Ambulatory Visit: Payer: Self-pay | Admitting: Physician Assistant

## 2019-01-28 DIAGNOSIS — I10 Essential (primary) hypertension: Secondary | ICD-10-CM

## 2019-02-13 ENCOUNTER — Other Ambulatory Visit: Payer: Self-pay | Admitting: Physician Assistant

## 2019-02-13 DIAGNOSIS — I10 Essential (primary) hypertension: Secondary | ICD-10-CM

## 2019-02-19 ENCOUNTER — Other Ambulatory Visit: Payer: Self-pay | Admitting: Family Medicine

## 2019-03-08 ENCOUNTER — Other Ambulatory Visit: Payer: Self-pay

## 2019-03-08 ENCOUNTER — Ambulatory Visit: Payer: 59 | Admitting: Nurse Practitioner

## 2019-03-08 ENCOUNTER — Ambulatory Visit (INDEPENDENT_AMBULATORY_CARE_PROVIDER_SITE_OTHER): Payer: 59 | Admitting: Nurse Practitioner

## 2019-03-08 ENCOUNTER — Encounter: Payer: Self-pay | Admitting: Nurse Practitioner

## 2019-03-08 VITALS — BP 129/79 | HR 62 | Temp 98.1°F | Resp 12 | Wt 218.0 lb

## 2019-03-08 DIAGNOSIS — J4541 Moderate persistent asthma with (acute) exacerbation: Secondary | ICD-10-CM | POA: Diagnosis not present

## 2019-03-08 DIAGNOSIS — J0100 Acute maxillary sinusitis, unspecified: Secondary | ICD-10-CM

## 2019-03-08 DIAGNOSIS — F418 Other specified anxiety disorders: Secondary | ICD-10-CM | POA: Diagnosis not present

## 2019-03-08 MED ORDER — ALPRAZOLAM 0.25 MG PO TABS: 0.2500 mg | ORAL_TABLET | Freq: Two times a day (BID) | ORAL | 0 refills | Status: DC | PRN

## 2019-03-08 MED ORDER — AMOXICILLIN-POT CLAVULANATE 875-125 MG PO TABS: 1.0000 | ORAL_TABLET | Freq: Two times a day (BID) | ORAL | 0 refills | Status: DC

## 2019-03-08 MED ORDER — AZITHROMYCIN 250 MG PO TABS: 250.0000 mg | ORAL_TABLET | ORAL | 3 refills | Status: DC

## 2019-03-08 NOTE — Progress Notes (Signed)
Acute Office Visit  Subjective:    Patient ID: [name], female    DOB: November 16, 1960, 59 y.o.   MRN: [id number]  Chief Complaint  Patient presents with  . Sinusitis  . Asthma    HPI  Asthma Patient is in today for exacerbation of Asthma symptoms after running out of her azithromycin in November.  She was unable to keep her last visit with the pulmonologist because she was caring for her daughter at that time.  Circumstances with her daughter's illness have kept her from being able to make a new appointment.  She reports that she felt she was doing well without the medication until about 2 weeks ago when her symptoms started to escalate.  ASTHMA Asthma status: exacerbated Satisfied with current treatment?: yes Albuterol/rescue inhaler frequency: 2-3 times per day Dyspnea frequency: daily Wheezing frequency: daily Cough frequency: daily Nocturnal symptom frequency: daily  Limitation of activity: yes Current upper respiratory symptoms: yes Triggers: seasonal allergens, weather changes Home peak flows: Last Spirometry: Last year- was due for one at November appointment  Failed/intolerant to following asthma meds: none Aerochamber/spacer use: yes Visits to ER or Urgent Care in past year: no Pneumovax: Up to Date Influenza: unable to take  Sinusitis She is also experiencing sinus pain and pressure, ear pain and pressure, sinus congestion, facial pain, and pressure for over a week.  She is also experiencing a nonproductive cough, chest tightness, wheezing, sore throat, and postnasal drip.  She does not feel that she has had a fever but she has not checked her temperature.  She feels her symptoms were improving but have now started to get worse.  Anxiety Additionally she is under a tremendous amount of situational stress with her daughter's illness. Her 28 year old daughter was diagnosed with ovarian cancer last year.  Unfortunately the cancer has metastasized and is now in terminal  stages. The patient is preparing to bring her daughter to her home with hospice.  She reports she is accepting of the diagnosis but still finds herself very anxious and emotional.  She reports she is not sleeping well due to her anxiety and feels that this may be contributing to her health problems.   . Past Medical History:  Diagnosis Date  . ALLERGIC RHINITIS 04/10/2008  . Asthma    asthmatic bronchitis  . Carpal tunnel syndrome of right wrist   . DEPRESSION 08/13/2008  . Diabetes mellitus without complication ([place])   . Hepatitis    fatty liver  . HYPERLIPIDEMIA 08/04/2006  . HYPERTENSION 08/04/2006  . HYPOTHYROIDISM 08/04/2006  . Kidney injury   . LOW BACK PAIN 08/04/2006  . Neuromuscular disorder ([place])   . PERIMENOPAUSAL SYNDROME 05/08/2008  . PVD (peripheral vascular disease) ([place])    occlusive, status post bifem bypass 2007    Past Surgical History:  Procedure Laterality Date  . CARPAL TUNNEL RELEASE Right 04/05/2016   Procedure: OPEN RIGHT CARPAL TUNNEL RELEASE;  Surgeon: Jessy Oto, MD;  Location: [facility];  Service: Orthopedics;  Laterality: Right;  . CHOLECYSTECTOMY    . FEMORAL BYPASS     bifem.  Marland Kitchen HAGLAND'S DEFORMITY EXCISION    . PLANTAR FASCIA RELEASE Right 04/18/2018   Procedure: PLANTAR FASCIA RELEASE AND GASTROCNEMIUS RECESSION RIGHT;  Surgeon: Newt Minion, MD;  Location: [facility];  Service: Orthopedics;  Laterality: Right;  . TUBAL LIGATION      Family History  Problem Relation Age of Onset  . Hypertension Mother   . Heart  disease Father   . Hyperlipidemia Father   . Hypertension Father   . Alcohol abuse Brother   . Diabetes Maternal Grandmother   . Alcohol abuse Maternal Uncle   . Stroke Maternal Uncle     Social History   Socioeconomic History  . Marital status: Married    Spouse name: Not on file  . Number of children: Not on file  . Years of education: Not on file  . Highest education level: Not on file  Occupational History  .  Not on file  Tobacco Use  . Smoking status: Former Smoker    Quit date: 02/15/2004    Years since quitting: 15.0  . Smokeless tobacco: Never Used  Substance and Sexual Activity  . Alcohol use: No  . Drug use: No  . Sexual activity: Yes    Birth control/protection: Post-menopausal  Other Topics Concern  . Not on file  Social History Narrative  . Not on file   Social Determinants of Health   Financial Resource Strain:   . Difficulty of Paying Living Expenses: Not on file  Food Insecurity:   . Worried About Charity fundraiser in the Last Year: Not on file  . Ran Out of Food in the Last Year: Not on file  Transportation Needs:   . Lack of Transportation (Medical): Not on file  . Lack of Transportation (Non-Medical): Not on file  Physical Activity:   . Days of Exercise per Week: Not on file  . Minutes of Exercise per Session: Not on file  Stress:   . Feeling of Stress : Not on file  Social Connections:   . Frequency of Communication with Friends and Family: Not on file  . Frequency of Social Gatherings with Friends and Family: Not on file  . Attends Religious Services: Not on file  . Active Member of Clubs or Organizations: Not on file  . Attends Archivist Meetings: Not on file  . Marital Status: Not on file  Intimate Partner Violence:   . Fear of Current or Ex-Partner: Not on file  . Emotionally Abused: Not on file  . Physically Abused: Not on file  . Sexually Abused: Not on file    Outpatient Medications Prior to Visit  Medication Sig Dispense Refill  . acetaminophen-codeine (TYLENOL #3) 300-30 MG tablet Take 1-2 tablets by mouth every 4 (four) hours as needed for moderate pain. 30 tablet 0  . albuterol (PROVENTIL HFA;VENTOLIN HFA) 108 (90 Base) MCG/ACT inhaler INHALE 2 PUFFS INTO THE LUNGS EVERY 6 HOURS AS NEEDED FOR WHEEZING OR SHORTNESS OF BREATH (Patient taking differently: Inhale 2 puffs into the lungs every 6 (six) hours as needed for wheezing or shortness  of breath. ) 18 g 0  . albuterol (PROVENTIL) (2.5 MG/3ML) 0.083% nebulizer solution Take 3 mLs (2.5 mg total) by nebulization every 6 (six) hours as needed for wheezing or shortness of breath. 150 mL 1  . azelastine (ASTELIN) 0.1 % nasal spray Place 1 spray into the nose daily as needed for rhinitis.     Marland Kitchen benazepril (LOTENSIN) 10 MG tablet TAKE 1 TABLET BY MOUTH EVERY DAY 30 tablet 0  . cyclobenzaprine (FLEXERIL) 10 MG tablet Take 1 tablet (10 mg total) by mouth 3 (three) times daily as needed for muscle spasms. 90 tablet 0  . dicyclomine (BENTYL) 10 MG capsule Take 1 capsule (10 mg total) by mouth 4 (four) times daily -  before meals and at bedtime. 120 capsule 12  . fluticasone  furoate-vilanterol (BREO ELLIPTA) 100-25 MCG/INH AEPB Inhale 1 puff into the lungs daily.    Marland Kitchen ibuprofen (ADVIL,MOTRIN) 200 MG tablet Take 400-600 mg by mouth every 6 (six) hours as needed for headache or moderate pain.    Marland Kitchen levothyroxine (SYNTHROID) 88 MCG tablet Take 1 tablet (88 mcg total) by mouth daily. 90 tablet 3  . Menthol, Topical Analgesic, (ICY HOT EX) Apply 1 application topically daily as needed (pain).    . metoprolol tartrate (LOPRESSOR) 50 MG tablet TAKE 1 TABLET BY MOUTH TWICE A DAY 60 tablet 2  . triamcinolone (NASACORT) 55 MCG/ACT AERO nasal inhaler Place 1 spray into the nose daily as needed (rhinitis).   12  . umeclidinium bromide (INCRUSE ELLIPTA) 62.5 MCG/INH AEPB Inhale 1 puff into the lungs daily.    Marland Kitchen venlafaxine XR (EFFEXOR-XR) 150 MG 24 hr capsule Take 1 capsule (150 mg total) by mouth daily with breakfast. 90 capsule 1  . azithromycin (ZITHROMAX) 250 MG tablet Take 250 mg by mouth every Monday, Wednesday, and Friday. At night     No facility-administered medications prior to visit.    Allergies  Allergen Reactions  . Influenza Virus Vacc Split Pf Swelling and Other (See Comments)    Swelling around injection site  . Gluten Meal Diarrhea  . Lipitor [Atorvastatin] Other (See Comments)     Hepatis     Review of Systems  Constitutional: Positive for fatigue. Negative for chills and fever.  HENT: Positive for congestion, ear pain, postnasal drip, rhinorrhea, sinus pressure, sinus pain and sore throat. Negative for facial swelling and trouble swallowing.   Respiratory: Positive for cough, chest tightness, shortness of breath and wheezing. Negative for stridor.   Cardiovascular: Negative for chest pain and palpitations.  Gastrointestinal: Negative for diarrhea, nausea and vomiting.  Musculoskeletal: Negative for arthralgias and myalgias.  Allergic/Immunologic: Positive for environmental allergies.  Neurological: Positive for headaches. Negative for weakness, light-headedness and numbness.  Psychiatric/Behavioral: Positive for sleep disturbance. The patient is nervous/anxious.       Objective:    Physical Exam Vitals and nursing note reviewed.  Constitutional:      Appearance: Normal appearance.  HENT:     Right Ear: Hearing, tympanic membrane, ear canal and external ear normal.     Left Ear: Hearing, tympanic membrane, ear canal and external ear normal.     Nose: Congestion and rhinorrhea present.     Right Sinus: Maxillary sinus tenderness and frontal sinus tenderness present.     Left Sinus: Maxillary sinus tenderness and frontal sinus tenderness present.     Mouth/Throat:     Mouth: Mucous membranes are moist.     Pharynx: Posterior oropharyngeal erythema present.  Cardiovascular:     Rate and Rhythm: Normal rate.     Pulses: Normal pulses.  Pulmonary:     Effort: Pulmonary effort is normal. Prolonged expiration present. No accessory muscle usage, respiratory distress or retractions.     Breath sounds: Wheezing present. No rhonchi.  Abdominal:     Palpations: Abdomen is soft.  Musculoskeletal:     Cervical back: No tenderness.  Skin:    General: Skin is warm and dry.     Capillary Refill: Capillary refill takes less than 2 seconds.  Neurological:     Mental  Status: She is alert and oriented to person, place, and time.  Psychiatric:        Attention and Perception: Attention and perception normal.        Mood and Affect: Affect is  tearful.        Speech: Speech normal.        Behavior: Behavior normal. Behavior is cooperative.        Thought Content: Thought content normal.        Cognition and Memory: Cognition and memory normal.        Judgment: Judgment normal.    BP 129/79 (BP Location: Left Arm, Patient Position: Sitting, Cuff Size: Normal)   Pulse 62   Temp 98.1 F (36.7 C) (Oral)   Resp 12   Wt 218 lb (98.9 kg)   SpO2 98%   BMI 37.42 kg/m  Wt Readings from Last 3 Encounters:  03/08/19 218 lb (98.9 kg)  12/03/18 213 lb (96.6 kg)  09/27/18 215 lb (97.5 kg)    Health Maintenance Due  Topic Date Due  . OPHTHALMOLOGY EXAM  04/08/1970  . COLONOSCOPY  04/08/2010  . PAP SMEAR-Modifier  01/17/2016  . MAMMOGRAM  02/03/2017    There are no preventive care reminders to display for this patient.   Lab Results  Component Value Date   TSH 1.740 04/18/2018   Lab Results  Component Value Date   WBC 10.9 (H) 04/18/2018   HGB 16.0 (H) 04/18/2018   HCT 48.7 (H) 04/18/2018   MCV 92.8 04/18/2018   PLT 203 04/18/2018   Lab Results  Component Value Date   NA 138 04/18/2018   K 3.7 04/18/2018   CO2 23 04/18/2018   GLUCOSE 125 (H) 04/18/2018   BUN 11 04/18/2018   CREATININE 0.92 04/18/2018   BILITOT 0.6 04/18/2018   ALKPHOS 116 04/18/2018   AST 22 04/18/2018   ALT 31 04/18/2018   PROT 7.4 04/18/2018   ALBUMIN 3.7 04/18/2018   CALCIUM 9.3 04/18/2018   ANIONGAP 11 04/18/2018   GFR 74.37 03/25/2013   Lab Results  Component Value Date   CHOL 201 (H) 11/17/2015   Lab Results  Component Value Date   HDL 40 (L) 11/17/2015   Lab Results  Component Value Date   LDLCALC 138 (H) 11/17/2015   Lab Results  Component Value Date   TRIG 113 11/17/2015   Lab Results  Component Value Date   CHOLHDL 5.0 11/17/2015   Lab  Results  Component Value Date   HGBA1C 6.7 (A) 12/03/2018       Assessment & Plan:   1. Moderate persistent asthma with acute exacerbation Will restart azithromycin keeping the dosage for Monday, Wednesday, and Friday, as pulmonology prescribed.  Patient to notify the office if refills are needed on her inhalers.  Patient to make appointment with pulmonology for peak flow measurement within the next 3 months.  Encouraged patient to continue using flutter valve to help clear secretions.  If symptoms persist once the azithromycin is restarted, will add an oral steroid burst.   - azithromycin (ZITHROMAX) 250 MG tablet; Take 1 tablet (250 mg total) by mouth every Monday, Wednesday, and Friday. At night  Dispense: 15 each; Refill: 3  2. Acute non-recurrent maxillary sinusitis Sinusitis that has likely progressed to bacterial infection due to length of time the patient has exhibited symptoms.  Prescription for Augmentin sent to pharmacy.  Patient is very likely run down from caregiver burden, lack of sleep, stress, and asthma exacerbation.  Encouraged patient to rest as much as possible and take care of herself. - amoxicillin-clavulanate (AUGMENTIN) 875-125 MG tablet; Take 1 tablet by mouth 2 (two) times daily.  Dispense: 14 tablet; Refill: 0  3. Situational anxiety Patient is  experiencing extreme anxiety symptoms related her daughters illness and terminal diagnosis.  Her venlafaxine dose was increased in October, and this has helped with some of her symptoms.  She is still experiencing significant sleep disturbance.  After discussion with the patient about the use for a very limited frequency and duration, a prescription for low-dose Xanax was sent.  Patient to make follow-up appointment if she feels that her anxiety levels are increasing or if medication is ineffective. - ALPRAZolam (XANAX) 0.25 MG tablet; Take 1 tablet (0.25 mg total) by mouth 2 (two) times daily as needed for anxiety.  Dispense: 30  tablet; Refill: 0  Return if symptoms worsen or fail to improve.  Orma Render, NP

## 2019-03-13 ENCOUNTER — Other Ambulatory Visit: Payer: Self-pay | Admitting: Physician Assistant

## 2019-03-13 ENCOUNTER — Encounter: Payer: Self-pay | Admitting: Nurse Practitioner

## 2019-03-13 DIAGNOSIS — I10 Essential (primary) hypertension: Secondary | ICD-10-CM

## 2019-04-08 ENCOUNTER — Ambulatory Visit (INDEPENDENT_AMBULATORY_CARE_PROVIDER_SITE_OTHER)
Admission: RE | Admit: 2019-04-08 | Discharge: 2019-04-08 | Disposition: A | Discharge status: Home / Self Care | Payer: 59 | Source: Ambulatory Visit

## 2019-04-08 DIAGNOSIS — J029 Acute pharyngitis, unspecified: Secondary | ICD-10-CM | POA: Diagnosis not present

## 2019-04-08 MED ORDER — DOXYCYCLINE HYCLATE 100 MG PO CAPS: 100.0000 mg | ORAL_CAPSULE | Freq: Two times a day (BID) | ORAL | 0 refills | Status: DC

## 2019-04-08 NOTE — ED Provider Notes (Signed)
Virtual Visit via Video Note:  [name]  initiated request for Telemedicine visit with [facility] team. I connected with [name]  on 04/08/2019 at 5:23 PM  for a synchronized telemedicine visit using a video enabled HIPPA compliant telemedicine application. I verified that I am speaking with [name]  using two identifiers. [name], NP  was physically located in a [facility] Urgent care site and [name] was located at a different location.   The limitations of evaluation and management by telemedicine as well as the availability of in-person appointments were discussed. Patient was informed that she  may incur a bill ( including co-pay) for this virtual visit encounter. [name]  expressed understanding and gave verbal consent to proceed with virtual visit.     History of Present Illness:[name]  is a 59 y.o. female presents for evaluation of sore throat x 1 week.  She states she has white patches on her throat.  She is currently on Augmentin prescribed by her PCP; since 04/03/2019.  She reports minimal improvement of her sore throat.  She is taking Advil and Tylenol for her symptoms.  She denies fever, difficulty swallowing, cough, shortness of breath, rash, or other symptoms.     Allergies  Allergen Reactions  . Influenza Virus Vacc Split Pf Swelling and Other (See Comments)    Swelling around injection site  . Gluten Meal Diarrhea  . Lipitor [Atorvastatin] Other (See Comments)    Hepatis      Past Medical History:  Diagnosis Date  . ALLERGIC RHINITIS 04/10/2008  . Asthma    asthmatic bronchitis  . Carpal tunnel syndrome of right wrist   . DEPRESSION 08/13/2008  . Diabetes mellitus without complication ([place])   . Hepatitis    fatty liver  . HYPERLIPIDEMIA 08/04/2006  . HYPERTENSION 08/04/2006  . HYPOTHYROIDISM 08/04/2006  . Kidney injury   . LOW BACK PAIN 08/04/2006  . Neuromuscular disorder ([place])   . PERIMENOPAUSAL SYNDROME 05/08/2008  .  PVD (peripheral vascular disease) ([place])    occlusive, status post bifem bypass 2007     Social History   Tobacco Use  . Smoking status: Former Smoker    Quit date: 02/15/2004    Years since quitting: 15.1  . Smokeless tobacco: Never Used  Substance Use Topics  . Alcohol use: No  . Drug use: No    ROS: as stated in HPI.  All other systems reviewed and negative.      Observations/Objective: Physical Exam  VITALS: Patient denies fever. GENERAL: Alert, appears well and in no acute distress. HEENT: Atraumatic. NECK: Normal movements of the head and neck. CARDIOPULMONARY: No increased WOB. Speaking in clear sentences. I:E ratio WNL.  MS: Moves all visible extremities without noticeable abnormality. PSYCH: Pleasant and cooperative, well-groomed. Speech normal rate and rhythm. Affect is appropriate. Insight and judgement are appropriate. Attention is focused, linear, and appropriate.  NEURO: CN grossly intact. Oriented as arrived to appointment on time with no prompting. Moves both UE equally.  SKIN: No obvious lesions, wounds, erythema, or cyanosis noted on face or hands.   Assessment and Plan:    ICD-10-CM   1. Sore throat  J02.9        Follow Up Instructions: Instructed patient to stop Augmentin; start doxycycline.  Instructed her to follow-up with her PCP or come here to be seen in person if her symptoms are not improving.  Patient agrees to plan of  care.      I discussed the assessment and treatment plan with the patient. The patient was provided an opportunity to ask questions and all were answered. The patient agreed with the plan and demonstrated an understanding of the instructions.   The patient was advised to call back or seek an in-person evaluation if the symptoms worsen or if the condition fails to improve as anticipated.      [name], NP  04/08/2019 5:23 PM         [name], NP 04/08/19 1725

## 2019-04-08 NOTE — Discharge Instructions (Signed)
Stop taking the Augmentin.  Start taking the doxycycline as directed.    Follow-up with your primary care provider or come here to be seen in person if your symptoms are not improving.

## 2019-04-09 ENCOUNTER — Other Ambulatory Visit: Payer: Self-pay | Admitting: Physician Assistant

## 2019-04-09 DIAGNOSIS — I10 Essential (primary) hypertension: Secondary | ICD-10-CM

## 2019-04-21 ENCOUNTER — Other Ambulatory Visit: Payer: Self-pay | Admitting: Physician Assistant

## 2019-04-21 DIAGNOSIS — I10 Essential (primary) hypertension: Secondary | ICD-10-CM

## 2019-05-07 ENCOUNTER — Ambulatory Visit (INDEPENDENT_AMBULATORY_CARE_PROVIDER_SITE_OTHER): Payer: 59 | Admitting: Sports Medicine

## 2019-05-07 ENCOUNTER — Ambulatory Visit: Payer: 59 | Admitting: Sports Medicine

## 2019-05-07 ENCOUNTER — Other Ambulatory Visit: Payer: Self-pay

## 2019-05-07 ENCOUNTER — Encounter: Payer: Self-pay | Admitting: Sports Medicine

## 2019-05-07 DIAGNOSIS — G8929 Other chronic pain: Secondary | ICD-10-CM | POA: Insufficient documentation

## 2019-05-07 DIAGNOSIS — M25511 Pain in right shoulder: Secondary | ICD-10-CM

## 2019-05-07 HISTORY — DX: Other chronic pain: G89.29

## 2019-05-07 MED ORDER — TRAMADOL HCL 50 MG PO TABS: 50.0000 mg | ORAL_TABLET | Freq: Three times a day (TID) | ORAL | 0 refills | Status: DC | PRN

## 2019-05-07 NOTE — Progress Notes (Signed)
    Procedures performed today:    None.  Independent interpretation of notes and tests performed by another provider:   None.  Impression and Recommendations:    Chronic right shoulder pain [name] has chronic right shoulder pain, she is been treated by Dr. Louanne Skye in the past. X-rays are showed multiple pathologic findings including curved acromion, AC arthritis, on exam her symptoms are more impingement. She is getting woken from sleep. Historically we would be doing an injection but she just had her COVID-19 vaccine, her second Pfizer vaccine is scheduled for the middle of April so we will need to wait till sometime in May before we do an injection. In the meantime she will do tramadol as needed.     ___________________________________________ Gwen Her. Dianah Field, M.D., ABFM., CAQSM. Primary Care and [place] Instructor of [place] [employer] [facility] of Medicine

## 2019-05-07 NOTE — Assessment & Plan Note (Signed)
[name] has chronic right shoulder pain, she is been treated by Dr. Louanne Skye in the past. X-rays are showed multiple pathologic findings including curved acromion, AC arthritis, on exam her symptoms are more impingement. She is getting woken from sleep. Historically we would be doing an injection but she just had her COVID-19 vaccine, her second Pfizer vaccine is scheduled for the middle of April so we will need to wait till sometime in May before we do an injection. In the meantime she will do tramadol as needed.

## 2019-05-10 ENCOUNTER — Ambulatory Visit (INDEPENDENT_AMBULATORY_CARE_PROVIDER_SITE_OTHER): Payer: 59 | Admitting: Nurse Practitioner

## 2019-05-10 ENCOUNTER — Other Ambulatory Visit: Payer: Self-pay

## 2019-05-10 ENCOUNTER — Encounter: Payer: Self-pay | Admitting: Nurse Practitioner

## 2019-05-10 VITALS — BP 173/104 | HR 80 | Temp 98.3°F | Ht 64.0 in | Wt 218.1 lb

## 2019-05-10 DIAGNOSIS — E1159 Type 2 diabetes mellitus with other circulatory complications: Secondary | ICD-10-CM

## 2019-05-10 DIAGNOSIS — F418 Other specified anxiety disorders: Secondary | ICD-10-CM

## 2019-05-10 DIAGNOSIS — I10 Essential (primary) hypertension: Secondary | ICD-10-CM | POA: Diagnosis not present

## 2019-05-10 DIAGNOSIS — J4540 Moderate persistent asthma, uncomplicated: Secondary | ICD-10-CM

## 2019-05-10 DIAGNOSIS — F4321 Adjustment disorder with depressed mood: Secondary | ICD-10-CM

## 2019-05-10 DIAGNOSIS — E785 Hyperlipidemia, unspecified: Secondary | ICD-10-CM

## 2019-05-10 DIAGNOSIS — Z634 Disappearance and death of family member: Secondary | ICD-10-CM

## 2019-05-10 DIAGNOSIS — E890 Postprocedural hypothyroidism: Secondary | ICD-10-CM

## 2019-05-10 MED ORDER — INCRUSE ELLIPTA 62.5 MCG/INH IN AEPB: 1.0000 | INHALATION_SPRAY | Freq: Every day | RESPIRATORY_TRACT | 5 refills | Status: DC

## 2019-05-10 MED ORDER — AZELASTINE HCL 0.1 % NA SOLN: 1.0000 | Freq: Every day | NASAL | 5 refills | Status: DC | PRN

## 2019-05-10 MED ORDER — BENAZEPRIL HCL 10 MG PO TABS: 10.0000 mg | ORAL_TABLET | Freq: Every day | ORAL | 5 refills | Status: DC

## 2019-05-10 MED ORDER — VENLAFAXINE HCL ER 150 MG PO CP24: 150.0000 mg | ORAL_CAPSULE | Freq: Every day | ORAL | 1 refills | Status: DC

## 2019-05-10 MED ORDER — ALBUTEROL SULFATE (2.5 MG/3ML) 0.083% IN NEBU: 2.5000 mg | INHALATION_SOLUTION | Freq: Four times a day (QID) | RESPIRATORY_TRACT | 1 refills | Status: DC | PRN

## 2019-05-10 MED ORDER — METOPROLOL TARTRATE 50 MG PO TABS: 50.0000 mg | ORAL_TABLET | Freq: Two times a day (BID) | ORAL | 5 refills | Status: DC

## 2019-05-10 MED ORDER — BREO ELLIPTA 100-25 MCG/INH IN AEPB: 1.0000 | INHALATION_SPRAY | Freq: Every day | RESPIRATORY_TRACT | 5 refills | Status: DC

## 2019-05-10 NOTE — Patient Instructions (Signed)
DASH Eating Plan DASH stands for "Dietary Approaches to Stop Hypertension." The DASH eating plan is a healthy eating plan that has been shown to reduce high blood pressure (hypertension). It may also reduce your risk for type 2 diabetes, heart disease, and stroke. The DASH eating plan may also help with weight loss. What are tips for following this plan?  General guidelines  Avoid eating more than 2,300 mg (milligrams) of salt (sodium) a day. If you have hypertension, you may need to reduce your sodium intake to 1,500 mg a day.  Limit alcohol intake to no more than 1 drink a day for nonpregnant women and 2 drinks a day for men. One drink equals 12 oz of beer, 5 oz of wine, or 1 oz of hard liquor.  Work with your health care provider to maintain a healthy body weight or to lose weight. Ask what an ideal weight is for you.  Get at least 30 minutes of exercise that causes your heart to beat faster (aerobic exercise) most days of the week. Activities may include walking, swimming, or biking.  Work with your health care provider or diet and nutrition specialist (dietitian) to adjust your eating plan to your individual calorie needs. Reading food labels   Check food labels for the amount of sodium per serving. Choose foods with less than 5 percent of the Daily Value of sodium. Generally, foods with less than 300 mg of sodium per serving fit into this eating plan.  To find whole grains, look for the word "whole" as the first word in the ingredient list. Shopping  Buy products labeled as "low-sodium" or "no salt added."  Buy fresh foods. Avoid canned foods and premade or frozen meals. Cooking  Avoid adding salt when cooking. Use salt-free seasonings or herbs instead of table salt or sea salt. Check with your health care provider or pharmacist before using salt substitutes.  Do not fry foods. Cook foods using healthy methods such as baking, boiling, grilling, and broiling instead.  Cook with  heart-healthy oils, such as olive, canola, soybean, or sunflower oil. Meal planning  Eat a balanced diet that includes: ? 5 or more servings of fruits and vegetables each day. At each meal, try to fill half of your plate with fruits and vegetables. ? Up to 6-8 servings of whole grains each day. ? Less than 6 oz of lean meat, poultry, or fish each day. A 3-oz serving of meat is about the same size as a deck of cards. One egg equals 1 oz. ? 2 servings of low-fat dairy each day. ? A serving of nuts, seeds, or beans 5 times each week. ? Heart-healthy fats. Healthy fats called Omega-3 fatty acids are found in foods such as flaxseeds and coldwater fish, like sardines, salmon, and mackerel.  Limit how much you eat of the following: ? Canned or prepackaged foods. ? Food that is high in trans fat, such as fried foods. ? Food that is high in saturated fat, such as fatty meat. ? Sweets, desserts, sugary drinks, and other foods with added sugar. ? Full-fat dairy products.  Do not salt foods before eating.  Try to eat at least 2 vegetarian meals each week.  Eat more home-cooked food and less restaurant, buffet, and fast food.  When eating at a restaurant, ask that your food be prepared with less salt or no salt, if possible. What foods are recommended? The items listed may not be a complete list. Talk with your dietitian about   what dietary choices are best for you. Grains Whole-grain or whole-wheat bread. Whole-grain or whole-wheat pasta. Brown rice. Oatmeal. Quinoa. Bulgur. Whole-grain and low-sodium cereals. Pita bread. Low-fat, low-sodium crackers. Whole-wheat flour tortillas. Vegetables Fresh or frozen vegetables (raw, steamed, roasted, or grilled). Low-sodium or reduced-sodium tomato and vegetable juice. Low-sodium or reduced-sodium tomato sauce and tomato paste. Low-sodium or reduced-sodium canned vegetables. Fruits All fresh, dried, or frozen fruit. Canned fruit in natural juice (without  added sugar). Meat and other protein foods Skinless chicken or turkey. Ground chicken or turkey. Pork with fat trimmed off. Fish and seafood. Egg whites. Dried beans, peas, or lentils. Unsalted nuts, nut butters, and seeds. Unsalted canned beans. Lean cuts of beef with fat trimmed off. Low-sodium, lean deli meat. Dairy Low-fat (1%) or fat-free (skim) milk. Fat-free, low-fat, or reduced-fat cheeses. Nonfat, low-sodium ricotta or cottage cheese. Low-fat or nonfat yogurt. Low-fat, low-sodium cheese. Fats and oils Soft margarine without trans fats. Vegetable oil. Low-fat, reduced-fat, or light mayonnaise and salad dressings (reduced-sodium). Canola, safflower, olive, soybean, and sunflower oils. Avocado. Seasoning and other foods Herbs. Spices. Seasoning mixes without salt. Unsalted popcorn and pretzels. Fat-free sweets. What foods are not recommended? The items listed may not be a complete list. Talk with your dietitian about what dietary choices are best for you. Grains Baked goods made with fat, such as croissants, muffins, or some breads. Dry pasta or rice meal packs. Vegetables Creamed or fried vegetables. Vegetables in a cheese sauce. Regular canned vegetables (not low-sodium or reduced-sodium). Regular canned tomato sauce and paste (not low-sodium or reduced-sodium). Regular tomato and vegetable juice (not low-sodium or reduced-sodium). Pickles. Olives. Fruits Canned fruit in a light or heavy syrup. Fried fruit. Fruit in cream or butter sauce. Meat and other protein foods Fatty cuts of meat. Ribs. Fried meat. Bacon. Sausage. Bologna and other processed lunch meats. Salami. Fatback. Hotdogs. Bratwurst. Salted nuts and seeds. Canned beans with added salt. Canned or smoked fish. Whole eggs or egg yolks. Chicken or turkey with skin. Dairy Whole or 2% milk, cream, and half-and-half. Whole or full-fat cream cheese. Whole-fat or sweetened yogurt. Full-fat cheese. Nondairy creamers. Whipped toppings.  Processed cheese and cheese spreads. Fats and oils Butter. Stick margarine. Lard. Shortening. Ghee. Bacon fat. Tropical oils, such as coconut, palm kernel, or palm oil. Seasoning and other foods Salted popcorn and pretzels. Onion salt, garlic salt, seasoned salt, table salt, and sea salt. Worcestershire sauce. Tartar sauce. Barbecue sauce. Teriyaki sauce. Soy sauce, including reduced-sodium. Steak sauce. Canned and packaged gravies. Fish sauce. Oyster sauce. Cocktail sauce. Horseradish that you find on the shelf. Ketchup. Mustard. Meat flavorings and tenderizers. Bouillon cubes. Hot sauce and Tabasco sauce. Premade or packaged marinades. Premade or packaged taco seasonings. Relishes. Regular salad dressings. Where to find more information:  National Heart, Lung, and [employer] Institute: www.nhlbi.nih.gov  American Heart Association: www.heart.org Summary  The DASH eating plan is a healthy eating plan that has been shown to reduce high blood pressure (hypertension). It may also reduce your risk for type 2 diabetes, heart disease, and stroke.  With the DASH eating plan, you should limit salt (sodium) intake to 2,300 mg a day. If you have hypertension, you may need to reduce your sodium intake to 1,500 mg a day.  When on the DASH eating plan, aim to eat more fresh fruits and vegetables, whole grains, lean proteins, low-fat dairy, and heart-healthy fats.  Work with your health care provider or diet and nutrition specialist (dietitian) to adjust your eating plan to your   individual calorie needs. This information is not intended to replace advice given to you by your health care provider. Make sure you discuss any questions you have with your health care provider. Document Revised: 01/13/2017 Document Reviewed: 01/25/2016 [employer] Patient Education  2020 [employer]  

## 2019-05-10 NOTE — Progress Notes (Signed)
Established Patient Office Visit  Subjective:  Patient ID: [name], female    DOB: June 11, 1960  Age: 59 y.o. MRN: [id number]  CC: No chief complaint on file.   HPI SHALIA BARTKO presents for follow-up for HTN, pre-diabetes, HLD, Hypothyroidism, and Asthma  She has been having a very difficult time the last 2 months.  Her daughter passed away in 04/14/22 of this year after a brief battle with cancer.  Shortly thereafter her husband had a stroke and spent several weeks in the hospital.  Her husband did come home from the hospital earlier this week and is starting with physical therapy on an outpatient basis.  She reports she has been experiencing increased anxiety and depression symptoms related to these life events but at this time she does not feel the need to increase her medications.  She has been using her alprazolam as needed on a very limited basis only when things are so overwhelming that she feels she can no longer function.  She is having a rough day emotionally today overall.  But she reports that the holidays are becoming less frequent and she feels she is coping fairly well with the recent losses.  She did declined to do the PHQ-9 and GAD-7 today due to her emotional state.  She has an excellent support system at home between her husband, her son, her daughter-in-law, and her new grandchild.  She feels very supported and encouraged.  She also expresses a great deal of pleasure with her current job and feels very supported by her boss and the company.  She reports that they have been very helpful during this difficult time.  Her blood pressure is elevated today however she reports she forgot to take her medication this morning.  She reports that it has been within the normal range the last several times she has monitored it at home.  She recently purchased a treadmill and has been walking daily.  She reports this is significantly helping with her depression and anxiety after the loss of  her daughter.  She reports she has lost approximately 5 pounds.  DIABETES Taking medications as prescribed: No medication Glucose Monitoring: no Polydipsia/polyuria: no Visual changes: no Chest pain: no Paresthesias: no Blood Pressure Monitoring: not checking Retinal Examination: Up to Date Foot Exam: Up to Date Diabetic Education: Not Completed Pneumovax: unknown Influenza: Up to Date Aspirin: no  HYPOTHYROIDISM Current Medication and Dosages:Levothyroxine 88 mcg Thyroid control status:controlled Satisfied with current treatment? yes Medication side effects: no Medication compliance: good compliance Recent dose adjustment:no Fatigue: no Cold intolerance: no Heat intolerance: no Weight gain: no Weight loss: no Constipation: no Diarrhea/loose stools: no Palpitations: no Lower extremity edema: no Anxiety/depressed mood: yes- situational- daughter passed away 2019/04/04 and husband had a stroke shortly thereafter.   HYPERTENSION / HYPERLIPIDEMIA Current Medications and Doses: benazepril 10 mg and metoprolol tartrate 50 mg Satisfied with current treatment? yes Duration of hypertension: chronic Medication compliance: good compliance BP monitoring frequency: not checking BP range: 130's/80's  BP medication side effects: no Cholesterol medication side effects: not taking any medications- history of NAFL Cholesterol supplements: none Duration of hyperlipidemia: chronic Aspirin: no Recent stressors: yes Recurrent headaches: no Visual changes: no Palpitations: no Dyspnea: no Chest pain: no Lower extremity edema: no Dizzy/lightheaded: no  ASTHMA Asthma status: controlled Satisfied with current treatment?: yes Albuterol/rescue inhaler frequency: a few times a month Limitation of activity: no Current upper respiratory symptoms: no Triggers: weather changes Aerochamber/spacer use: no Visits to ER or  Urgent Care in past year: no Pneumovax: unknown Influenza: Up to  Date  Past Medical History:  Diagnosis Date  . ALLERGIC RHINITIS 04/10/2008  . Asthma    asthmatic bronchitis  . Carpal tunnel syndrome of right wrist   . DEPRESSION 08/13/2008  . Diabetes mellitus without complication ([place])   . Hepatitis    fatty liver  . HYPERLIPIDEMIA 08/04/2006  . HYPERTENSION 08/04/2006  . HYPOTHYROIDISM 08/04/2006  . Kidney injury   . LOW BACK PAIN 08/04/2006  . Neuromuscular disorder ([place])   . PERIMENOPAUSAL SYNDROME 05/08/2008  . PVD (peripheral vascular disease) ([place])    occlusive, status post bifem bypass 2007    Past Surgical History:  Procedure Laterality Date  . CARPAL TUNNEL RELEASE Right 04/05/2016   Procedure: OPEN RIGHT CARPAL TUNNEL RELEASE;  Surgeon: Jessy Oto, MD;  Location: [facility];  Service: Orthopedics;  Laterality: Right;  . CHOLECYSTECTOMY    . FEMORAL BYPASS     bifem.  Marland Kitchen HAGLAND'S DEFORMITY EXCISION    . PLANTAR FASCIA RELEASE Right 04/18/2018   Procedure: PLANTAR FASCIA RELEASE AND GASTROCNEMIUS RECESSION RIGHT;  Surgeon: Newt Minion, MD;  Location: [facility];  Service: Orthopedics;  Laterality: Right;  . TUBAL LIGATION      Family History  Problem Relation Age of Onset  . Hypertension Mother   . Heart disease Father   . Hyperlipidemia Father   . Hypertension Father   . Alcohol abuse Brother   . Diabetes Maternal Grandmother   . Alcohol abuse Maternal Uncle   . Stroke Maternal Uncle     Social History   Socioeconomic History  . Marital status: Married    Spouse name: Not on file  . Number of children: Not on file  . Years of education: Not on file  . Highest education level: Not on file  Occupational History  . Not on file  Tobacco Use  . Smoking status: Former Smoker    Quit date: 02/15/2004    Years since quitting: 15.2  . Smokeless tobacco: Never Used  Substance and Sexual Activity  . Alcohol use: No  . Drug use: No  . Sexual activity: Yes    Birth control/protection: Post-menopausal  Other  Topics Concern  . Not on file  Social History Narrative  . Not on file   Social Determinants of Health   Financial Resource Strain:   . Difficulty of Paying Living Expenses:   Food Insecurity:   . Worried About Charity fundraiser in the Last Year:   . Arboriculturist in the Last Year:   Transportation Needs:   . Film/video editor (Medical):   Marland Kitchen Lack of Transportation (Non-Medical):   Physical Activity:   . Days of Exercise per Week:   . Minutes of Exercise per Session:   Stress:   . Feeling of Stress :   Social Connections:   . Frequency of Communication with Friends and Family:   . Frequency of Social Gatherings with Friends and Family:   . Attends Religious Services:   . Active Member of Clubs or Organizations:   . Attends Archivist Meetings:   Marland Kitchen Marital Status:   Intimate Partner Violence:   . Fear of Current or Ex-Partner:   . Emotionally Abused:   Marland Kitchen Physically Abused:   . Sexually Abused:     Outpatient Medications Prior to Visit  Medication Sig Dispense Refill  . albuterol (PROVENTIL) (2.5 MG/3ML) 0.083% nebulizer solution Take 3  mLs (2.5 mg total) by nebulization every 6 (six) hours as needed for wheezing or shortness of breath. 150 mL 1  . ALPRAZolam (XANAX) 0.25 MG tablet Take 1 tablet (0.25 mg total) by mouth 2 (two) times daily as needed for anxiety. 30 tablet 0  . amoxicillin-clavulanate (AUGMENTIN) 875-125 MG tablet Take 1 tablet by mouth 2 (two) times daily. 14 tablet 0  . azelastine (ASTELIN) 0.1 % nasal spray Place 1 spray into the nose daily as needed for rhinitis.     Marland Kitchen benazepril (LOTENSIN) 10 MG tablet Take 1 tablet (10 mg total) by mouth daily. Needs labs/ FINAL REFILL 7 tablet 0  . cyclobenzaprine (FLEXERIL) 10 MG tablet Take 1 tablet (10 mg total) by mouth 3 (three) times daily as needed for muscle spasms. 90 tablet 0  . diclofenac Sodium (VOLTAREN) 1 % GEL Apply topically.    . dicyclomine (BENTYL) 10 MG capsule Take 1 capsule (10 mg  total) by mouth 4 (four) times daily -  before meals and at bedtime. 120 capsule 12  . fluticasone furoate-vilanterol (BREO ELLIPTA) 100-25 MCG/INH AEPB Inhale 1 puff into the lungs daily.    Marland Kitchen ibuprofen (ADVIL,MOTRIN) 200 MG tablet Take 400-600 mg by mouth every 6 (six) hours as needed for headache or moderate pain.    Marland Kitchen levothyroxine (SYNTHROID) 88 MCG tablet Take 1 tablet (88 mcg total) by mouth daily. 90 tablet 3  . Menthol, Topical Analgesic, (ICY HOT EX) Apply 1 application topically daily as needed (pain).    . metoprolol tartrate (LOPRESSOR) 50 MG tablet TAKE 1 TABLET BY MOUTH TWICE A DAY 60 tablet 2  . traMADol (ULTRAM) 50 MG tablet Take 1-2 tablets (50-100 mg total) by mouth every 8 (eight) hours as needed for moderate pain. Maximum 6 tabs per day. 21 tablet 0  . triamcinolone (NASACORT) 55 MCG/ACT AERO nasal inhaler Place 1 spray into the nose daily as needed (rhinitis).   12  . umeclidinium bromide (INCRUSE ELLIPTA) 62.5 MCG/INH AEPB Inhale 1 puff into the lungs daily.    Marland Kitchen venlafaxine XR (EFFEXOR-XR) 150 MG 24 hr capsule Take 1 capsule (150 mg total) by mouth daily with breakfast. 90 capsule 1   No facility-administered medications prior to visit.    Allergies  Allergen Reactions  . Influenza Virus Vacc Split Pf Swelling and Other (See Comments)    Swelling around injection site  . Gluten Meal Diarrhea  . Lipitor [Atorvastatin] Other (See Comments)    Hepatis     ROS Review of Systems  Constitutional: Positive for fatigue. Negative for appetite change, chills, fever and unexpected weight change.  Respiratory: Negative for cough, choking, chest tightness, shortness of breath and wheezing.   Cardiovascular: Negative for chest pain, palpitations and leg swelling.  Gastrointestinal: Negative for abdominal pain, constipation, diarrhea and nausea.  Endocrine: Negative for cold intolerance, heat intolerance, polydipsia, polyphagia and polyuria.  Genitourinary: Negative for dysuria  and flank pain.  Musculoskeletal: Negative for arthralgias and myalgias.  Skin: Negative for color change and rash.  Neurological: Negative for dizziness, tremors, syncope, weakness, light-headedness and headaches.  Psychiatric/Behavioral: Positive for decreased concentration, dysphoric mood and sleep disturbance. Negative for confusion. The patient is nervous/anxious.       Objective:    Physical Exam  Constitutional: She is oriented to person, place, and time. She appears well-developed and well-nourished. She appears distressed.  HENT:  Head: Normocephalic.  Nose: Nose normal.  Mouth/Throat: Oropharynx is clear and moist.  Eyes: Pupils are equal, round, and  reactive to light. Conjunctivae and EOM are normal.  Cardiovascular: Normal rate, regular rhythm, normal heart sounds and intact distal pulses.  No murmur heard. Pulmonary/Chest: Effort normal and breath sounds normal. No respiratory distress. She has no wheezes.  Abdominal: Soft. Bowel sounds are normal. She exhibits no distension. There is no abdominal tenderness.  Musculoskeletal:        General: No edema. Normal range of motion.     Cervical back: Normal range of motion and neck supple.  Lymphadenopathy:    She has no cervical adenopathy.  Neurological: She is alert and oriented to person, place, and time. Coordination normal.  Skin: Skin is dry. No rash noted. No erythema.  Psychiatric: She has a normal mood and affect. Her behavior is normal. Judgment and thought content normal.  Nursing note and vitals reviewed. She is visibly emotional today and tearful throughout the interview.   There were no vitals taken for this visit. Wt Readings from Last 3 Encounters:  03/08/19 218 lb (98.9 kg)  12/03/18 213 lb (96.6 kg)  09/27/18 215 lb (97.5 kg)     Health Maintenance Due  Topic Date Due  . OPHTHALMOLOGY EXAM  Never done  . COLONOSCOPY  Never done  . PAP SMEAR-Modifier  01/17/2016  . MAMMOGRAM  02/03/2017     There are no preventive care reminders to display for this patient.  Lab Results  Component Value Date   TSH 1.740 04/18/2018   Lab Results  Component Value Date   WBC 10.9 (H) 04/18/2018   HGB 16.0 (H) 04/18/2018   HCT 48.7 (H) 04/18/2018   MCV 92.8 04/18/2018   PLT 203 04/18/2018   Lab Results  Component Value Date   NA 138 04/18/2018   K 3.7 04/18/2018   CO2 23 04/18/2018   GLUCOSE 125 (H) 04/18/2018   BUN 11 04/18/2018   CREATININE 0.92 04/18/2018   BILITOT 0.6 04/18/2018   ALKPHOS 116 04/18/2018   AST 22 04/18/2018   ALT 31 04/18/2018   PROT 7.4 04/18/2018   ALBUMIN 3.7 04/18/2018   CALCIUM 9.3 04/18/2018   ANIONGAP 11 04/18/2018   GFR 74.37 03/25/2013   Lab Results  Component Value Date   CHOL 201 (H) 11/17/2015   Lab Results  Component Value Date   HDL 40 (L) 11/17/2015   Lab Results  Component Value Date   LDLCALC 138 (H) 11/17/2015   Lab Results  Component Value Date   TRIG 113 11/17/2015   Lab Results  Component Value Date   CHOLHDL 5.0 11/17/2015   Lab Results  Component Value Date   HGBA1C 6.7 (A) 12/03/2018      Assessment & Plan:  1. Essential hypertension Her blood pressure was elevated today however she had not taken her medication this morning.  She reports that this is an infrequent incident therefore I do not feel it is necessary to make any changes at this time.  I did instruct her to monitor her blood pressure at home after she has been taking the medication for several days consistently and to notify me if there are any elevations to the blood pressure over 130/80 and we will address that at that time. We will monitor labs today for the presence of prediabetes/diabetes, kidney function, electrolyte imbalance, liver function, cholesterol, and complete blood counts. Prescription refills provided for benazepril and metoprolol tartrate. Patient instructed to follow-up in approximately 6 months for additional monitoring.  May need  follow-up sooner based on labs or if at home  blood pressures remain uncontrolled even with medication. - Hemoglobin A1c - COMPLETE METABOLIC PANEL WITH GFR - Lipid panel - CBC - benazepril (LOTENSIN) 10 MG tablet; Take 1 tablet (10 mg total) by mouth daily.  Dispense: 30 tablet; Refill: 5 - metoprolol tartrate (LOPRESSOR) 50 MG tablet; Take 1 tablet (50 mg total) by mouth 2 (two) times daily.  Dispense: 60 tablet; Refill: 5  2. Type 2 diabetes mellitus with vascular disease ([place]) Patient has a history of type 2 diabetes with vascular involvement.  Most recent labs show she is down to prediabetes and has been very well controlled with diet and exercise.  At this time she is not exhibiting any signs or symptoms of elevated blood sugars.  We will obtain labs today to ensure that her blood sugar levels are remaining within the normal limits as well as her kidney and liver function, cholesterol, and blood counts. Plan to follow-up in approximately 6 months or sooner if needed based on lab results. - Hemoglobin A1c - COMPLETE METABOLIC PANEL WITH GFR - Lipid panel - CBC  3. Dyslipidemia Patient does have a history of dyslipidemia.  She is unable to take statins due to a history of nonalcoholic fatty liver disease and previous significant elevations of liver function studies when taking statins.  She has historically been trying to control this with diet and exercise. We will obtain labs today to measure her current lipid levels as well as her hemoglobin A1c, liver and kidney function, and blood counts.  If changes need to be made to her course of treatment we will consider a nonstatin medication if needed. Patient was instructed to follow a low-fat, low-cholesterol diet such as the DASH diet to help manage her cholesterol without the need for medication. Plan to follow-up in approximately 6 months or sooner if needed based on lab results. - Hemoglobin A1c - COMPLETE METABOLIC PANEL WITH GFR - Lipid  panel - CBC  4. Situational anxiety Ms. Doss has had significant life events recently that have exacerbated her anxiety and depression symptoms.  The recent loss of her daughter and recent illness of her husband have both been very traumatic for her and life altering.  She is handling both situations and coping exceptionally well given the circumstances.  We did discuss the option of increasing or adding another medication for her anxiety and depression symptoms but at this time she feels she is well controlled on the dose of Effexor she has been taking. I will happily consider adding another medication or increasing this dose if possible if she feels the need arises. She may also benefit from grief counseling or therapy when things settle down a little bit.  I will certainly discuss this with her in the future. She has been using Xanax very sparingly and reports that it is effective in those moments where her anxiety is elevated to a point she is unable to control.  I am happy to continue to provide refill for this medication as long as the patient continues to use the medication as she has. Follow-up in approximately 6 months or sooner if needed for mood evaluation. - venlafaxine XR (EFFEXOR-XR) 150 MG 24 hr capsule; Take 1 capsule (150 mg total) by mouth daily with breakfast.  Dispense: 90 capsule; Refill: 1  5. Postoperative hypothyroidism Patient has a history significant for postoperative hypothyroidism.  She has not had her TSH H checked in a while.  We will obtain labs today to determine if her levels are adequate  and make adjustments as needed to her medication based on the lab results. Refill was not provided today on the levothyroxine in the event that changes need to be made based on the lab results. Patient to follow-up in approximately 6 months or sooner if needed based on the lab results. - TSH  6. Moderate persistent asthma without complication Patient has a history of moderate  persistent asthma without complication.  This has been well controlled historically with her daily medication and her rescue inhaler.  She is due for spirometry but due to the recent death of her daughter and stroke with her husband in addition to Covid restrictions, she has been in able to get into the office for this evaluation.  Refills have been provided for all of her inhalers until she can get in to pulmonology to be seen. She does not appear to have any exacerbations recently and is not experiencing any complications at this time. We will follow up in approximately 6 months or sooner if needed. - albuterol (PROVENTIL) (2.5 MG/3ML) 0.083% nebulizer solution; Take 3 mLs (2.5 mg total) by nebulization every 6 (six) hours as needed for wheezing or shortness of breath.  Dispense: 150 mL; Refill: 1 - azelastine (ASTELIN) 0.1 % nasal spray; Place 1 spray into both nostrils daily as needed for rhinitis.  Dispense: 30 mL; Refill: 5 - fluticasone furoate-vilanterol (BREO ELLIPTA) 100-25 MCG/INH AEPB; Inhale 1 puff into the lungs daily.  Dispense: 28 each; Refill: 5 - umeclidinium bromide (INCRUSE ELLIPTA) 62.5 MCG/INH AEPB; Inhale 1 puff into the lungs daily.  Dispense: 7 each; Refill: 5  7. Grief Ms. Doss is experiencing grief related to the recent loss of her daughter on February 14 of this year.  Shortly thereafter her husband also experienced a pretty substantial stroke which left him hospitalized for quite some time.  Her husband has returned home as of this week and is just now beginning outpatient therapy.  These stressors have had quite a significant impact on her however she is coping and managing exceptionally well given the circumstances. We discussed the normalcy of the feelings that she is having and the emotional toll that this has taken on her.  We also discussed the normalcy of good days and bad days related to the loss. I do believe that she could benefit from grief counseling.  At this time  I feel it would be an added burden given her husband just recently came home from the hospital and the stressors associated with getting him into therapy.  She does have some distractions which can help keep her from focusing on her own grief at this time.  Will consider referral for grief counseling or therapy in the future however. Patient encouraged to reach out if she is experiencing significant grief or sadness or needs someone to talk to.  Return in about 6 months (around 11/10/2019) for HTN/HLD/PreDM/Asthma.   Orma Render, NP

## 2019-05-18 LAB — LIPID PANEL
Cholesterol: 193 mg/dL (ref <200)
HDL: 41 mg/dL — ABNORMAL LOW (ref >=50)
LDL Cholesterol (Calc): 130 mg/dL (calc) — ABNORMAL HIGH
Non-HDL Cholesterol (Calc): 152 mg/dL (calc) — ABNORMAL HIGH (ref <130)
Total CHOL/HDL Ratio: 4.7 (calc) (ref <5.0)
Triglycerides: 117 mg/dL (ref <150)

## 2019-05-18 LAB — CBC
HCT: 41.5 % (ref 35.0–45.0)
Hemoglobin: 14.2 g/dL (ref 11.7–15.5)
MCH: 30.5 pg (ref 27.0–33.0)
MCHC: 34.2 g/dL (ref 32.0–36.0)
MCV: 89.1 fL (ref 80.0–100.0)
MPV: 9.9 fL (ref 7.5–12.5)
Platelets: 192 10*3/uL (ref 140–400)
RBC: 4.66 10*6/uL (ref 3.80–5.10)
RDW: 12.5 % (ref 11.0–15.0)
WBC: 7.3 10*3/uL (ref 3.8–10.8)

## 2019-05-18 LAB — COMPLETE METABOLIC PANEL WITH GFR
AG Ratio: 1.5 (calc) (ref 1.0–2.5)
ALT: 25 U/L (ref 6–29)
AST: 20 U/L (ref 10–35)
Albumin: 4 g/dL (ref 3.6–5.1)
Alkaline phosphatase (APISO): 110 U/L (ref 37–153)
BUN: 16 mg/dL (ref 7–25)
CO2: 21 mmol/L (ref 20–32)
Calcium: 9.3 mg/dL (ref 8.6–10.4)
Chloride: 106 mmol/L (ref 98–110)
Creat: 0.97 mg/dL (ref 0.50–1.05)
GFR, Est African American: 74 mL/min/{1.73_m2} (ref >=60)
GFR, Est Non African American: 64 mL/min/{1.73_m2} (ref >=60)
Globulin: 2.7 g/dL (calc) (ref 1.9–3.7)
Glucose, Bld: 118 mg/dL — ABNORMAL HIGH (ref 65–99)
Potassium: 4.3 mmol/L (ref 3.5–5.3)
Sodium: 139 mmol/L (ref 135–146)
Total Bilirubin: 0.4 mg/dL (ref 0.2–1.2)
Total Protein: 6.7 g/dL (ref 6.1–8.1)

## 2019-05-18 LAB — HEMOGLOBIN A1C
Hgb A1c MFr Bld: 6.6 % of total Hgb — ABNORMAL HIGH (ref <5.7)
Mean Plasma Glucose: 143 (calc)
eAG (mmol/L): 7.9 (calc)

## 2019-05-18 LAB — TSH: TSH: 1.41 mIU/L (ref 0.40–4.50)

## 2019-05-20 ENCOUNTER — Encounter: Payer: Self-pay | Admitting: Nurse Practitioner

## 2019-05-27 ENCOUNTER — Encounter: Payer: Self-pay | Admitting: Nurse Practitioner

## 2019-06-01 ENCOUNTER — Other Ambulatory Visit: Payer: Self-pay

## 2019-06-01 DIAGNOSIS — M25511 Pain in right shoulder: Secondary | ICD-10-CM

## 2019-06-01 DIAGNOSIS — G8929 Other chronic pain: Secondary | ICD-10-CM

## 2019-06-03 MED ORDER — TRAMADOL HCL 50 MG PO TABS: 50.0000 mg | ORAL_TABLET | Freq: Three times a day (TID) | ORAL | 0 refills | Status: DC | PRN

## 2019-06-06 ENCOUNTER — Encounter: Payer: Self-pay | Admitting: Nurse Practitioner

## 2019-06-19 ENCOUNTER — Other Ambulatory Visit: Payer: Self-pay

## 2019-06-19 ENCOUNTER — Encounter: Payer: Self-pay | Admitting: Nurse Practitioner

## 2019-06-19 ENCOUNTER — Ambulatory Visit (INDEPENDENT_AMBULATORY_CARE_PROVIDER_SITE_OTHER): Payer: 59 | Admitting: Sports Medicine

## 2019-06-19 DIAGNOSIS — M25511 Pain in right shoulder: Secondary | ICD-10-CM | POA: Diagnosis not present

## 2019-06-19 DIAGNOSIS — M222X1 Patellofemoral disorders, right knee: Secondary | ICD-10-CM

## 2019-06-19 DIAGNOSIS — G8929 Other chronic pain: Secondary | ICD-10-CM

## 2019-06-19 DIAGNOSIS — F418 Other specified anxiety disorders: Secondary | ICD-10-CM

## 2019-06-19 HISTORY — DX: Patellofemoral disorders, right knee: M22.2X1

## 2019-06-19 MED ORDER — VENLAFAXINE HCL ER 150 MG PO CP24: 150.0000 mg | ORAL_CAPSULE | Freq: Every day | ORAL | 1 refills | Status: DC

## 2019-06-19 NOTE — Assessment & Plan Note (Signed)
[name] has also noted grinding and pain under her patella when going up and down stairs, squatting. On exam she has mild patellar crepitus, no effusion, minimal pain at the patellar facets. All consistent with patellofemoral syndrome, she can use over-the-counter NSAIDs as desired, adding patellofemoral rehab, return to see me in 1 month for this, injection if no better.

## 2019-06-19 NOTE — Assessment & Plan Note (Signed)
Multifactorial right shoulder pain, treated by Dr. Louanne Skye in the past. X-rays have showed multiple pathologic findings including curved acromion, acromioclavicular arthritis, symptoms are impingement on exam, today we performed a subacromial injection. Return to see me in 1 month.

## 2019-06-19 NOTE — Progress Notes (Addendum)
    Procedures performed today:    Procedure: Real-time Ultrasound Guided injection of the right subacromial bursa Device: Samsung HS60  Verbal informed consent obtained.  Time-out conducted.  Black no overlying erythema, induration, or other signs of local infection.  Skin prepped in a sterile fashion.  Local anesthesia: Topical Ethyl chloride.  With sterile technique and under real time ultrasound guidance: 1 cc Kenalog 40, 1 cc lidocaine, 1cc bupivacaine injected easily Completed without difficulty  Pain immediately resolved suggesting accurate placement of the medication.  Advised to call if fevers/chills, erythema, induration, drainage, or persistent bleeding.  Images permanently stored and available for review in the ultrasound unit.  Impression: Technically successful ultrasound guided injection.  Independent interpretation of notes and tests performed by another provider:   None.  Brief History, Exam, Impression, and Recommendations:    Chronic right shoulder pain Multifactorial right shoulder pain, treated by Dr. Louanne Skye in the past. X-rays have showed multiple pathologic findings including curved acromion, acromioclavicular arthritis, symptoms are impingement on exam, today we performed a subacromial injection. Return to see me in 1 month.  Patellofemoral syndrome, right [name] grinding and pain under her patella when going up and down stairs, squatting. On exam she has mild patellar crepitus, no effusion, minimal pain at the patellar facets. All consistent with patellofemoral syndrome, she can use over-the-counter NSAIDs as desired, adding patellofemoral rehab, return to see me in 1 month for this, injection if no better.    ___________________________________________ Gwen Her. Dianah Field, M.D., ABFM., CAQSM. Primary Care and [place] Instructor of [place] of [facility]  of Medicine

## 2019-06-24 ENCOUNTER — Encounter: Payer: Self-pay | Admitting: Nurse Practitioner

## 2019-06-25 ENCOUNTER — Other Ambulatory Visit: Payer: Self-pay | Admitting: Nurse Practitioner

## 2019-06-25 DIAGNOSIS — J4540 Moderate persistent asthma, uncomplicated: Secondary | ICD-10-CM

## 2019-06-25 MED ORDER — ALBUTEROL SULFATE HFA 108 (90 BASE) MCG/ACT IN AERS: 1.0000 | INHALATION_SPRAY | RESPIRATORY_TRACT | 2 refills | Status: DC | PRN

## 2019-06-27 ENCOUNTER — Encounter: Payer: Self-pay | Admitting: Nurse Practitioner

## 2019-06-27 ENCOUNTER — Ambulatory Visit (INDEPENDENT_AMBULATORY_CARE_PROVIDER_SITE_OTHER): Payer: 59 | Admitting: Nurse Practitioner

## 2019-06-27 VITALS — BP 116/82 | HR 115 | Temp 98.9°F | Ht 64.0 in | Wt 220.0 lb

## 2019-06-27 DIAGNOSIS — Z1231 Encounter for screening mammogram for malignant neoplasm of breast: Secondary | ICD-10-CM | POA: Diagnosis not present

## 2019-06-27 DIAGNOSIS — J4541 Moderate persistent asthma with (acute) exacerbation: Secondary | ICD-10-CM | POA: Diagnosis not present

## 2019-06-27 DIAGNOSIS — J209 Acute bronchitis, unspecified: Secondary | ICD-10-CM | POA: Diagnosis not present

## 2019-06-27 HISTORY — DX: Moderate persistent asthma with (acute) exacerbation: J45.41

## 2019-06-27 MED ORDER — PREDNISONE 20 MG PO TABS: 20.0000 mg | ORAL_TABLET | Freq: Two times a day (BID) | ORAL | 2 refills | Status: DC

## 2019-06-27 MED ORDER — AZITHROMYCIN 250 MG PO TABS: ORAL_TABLET | ORAL | 2 refills | Status: DC

## 2019-06-27 MED ORDER — AMOXICILLIN-POT CLAVULANATE 875-125 MG PO TABS: 1.0000 | ORAL_TABLET | Freq: Two times a day (BID) | ORAL | 0 refills | Status: DC

## 2019-06-27 NOTE — Progress Notes (Signed)
Acute Office Visit  Subjective:    Patient ID: [name], female    DOB: Jul 06, 1960, 59 y.o.   MRN: [id number]  Chief Complaint  Patient presents with  . Asthma    asthmatic bronchitis exacerbation, ShOB, dry cough, chest congestion, BA, HA, fever, weakness/fatigue, decreased appetite, sinus congestion, facial pain/pressure, ear fullness, mild ST, scheduled with Pulmonologist on 08/13/2019,     HPI Patient is in today for symptoms of bronchitis that have been ongoing for the past several days. Her symptoms first started with the weather change this past weekend, but was significantly exacerbated when she went into her office on Tuesday. She has had severe asthmatic reactions when being in her office in the past, but was not sure if it was totally related to the office. She is sure now and is able to complete her work from home so that will be her plan from now on.   She does have a history of asthma and non-eosinophilic asthmatic bronchitis for which she utilizes albuterol (inhaler or nebulizer), fluticasone furoate and vilanterol (Breo Ellipta), and umeclidinium (Incruse Ellipta). She has been using her albuterol inhaler a few times a day for management, which helps.   She is experiencing chills, low grade fever, chest tightness, cough, and shortness of breath. She was on prophylactic antibiotics previously from the pulmonologist, but has not been taking these in some time. She does have a pulmonology appointment on June 29 for spirometry, but that was the soonest she could get in with them.    Past Medical History:  Diagnosis Date  . ALLERGIC RHINITIS 04/10/2008  . Asthma    asthmatic bronchitis  . Carpal tunnel syndrome of right wrist   . DEPRESSION 08/13/2008  . Diabetes mellitus without complication ([place])   . Hepatitis    fatty liver  . HYPERLIPIDEMIA 08/04/2006  . HYPERTENSION 08/04/2006  . HYPOTHYROIDISM 08/04/2006  . Kidney injury   . LOW BACK PAIN 08/04/2006  . Neuromuscular  disorder ([place])   . PERIMENOPAUSAL SYNDROME 05/08/2008  . PVD (peripheral vascular disease) ([place])    occlusive, status post bifem bypass 2007    Past Surgical History:  Procedure Laterality Date  . CARPAL TUNNEL RELEASE Right 04/05/2016   Procedure: OPEN RIGHT CARPAL TUNNEL RELEASE;  Surgeon: Jessy Oto, MD;  Location: [facility];  Service: Orthopedics;  Laterality: Right;  . CHOLECYSTECTOMY    . FEMORAL BYPASS     bifem.  Marland Kitchen HAGLAND'S DEFORMITY EXCISION    . PLANTAR FASCIA RELEASE Right 04/18/2018   Procedure: PLANTAR FASCIA RELEASE AND GASTROCNEMIUS RECESSION RIGHT;  Surgeon: Newt Minion, MD;  Location: [facility];  Service: Orthopedics;  Laterality: Right;  . TUBAL LIGATION      Family History  Problem Relation Age of Onset  . Hypertension Mother   . Heart disease Father   . Hyperlipidemia Father   . Hypertension Father   . Alcohol abuse Brother   . Diabetes Maternal Grandmother   . Alcohol abuse Maternal Uncle   . Stroke Maternal Uncle     Social History   Socioeconomic History  . Marital status: Married    Spouse name: Not on file  . Number of children: Not on file  . Years of education: Not on file  . Highest education level: Not on file  Occupational History  . Not on file  Tobacco Use  . Smoking status: Former Smoker    Quit date: 02/15/2004    Years since quitting: 15.3  .  Smokeless tobacco: Never Used  Substance and Sexual Activity  . Alcohol use: No  . Drug use: No  . Sexual activity: Yes    Birth control/protection: Post-menopausal  Other Topics Concern  . Not on file  Social History Narrative  . Not on file   Social Determinants of Health   Financial Resource Strain:   . Difficulty of Paying Living Expenses:   Food Insecurity:   . Worried About Charity fundraiser in the Last Year:   . Arboriculturist in the Last Year:   Transportation Needs:   . Film/video editor (Medical):   Marland Kitchen Lack of Transportation (Non-Medical):    Physical Activity:   . Days of Exercise per Week:   . Minutes of Exercise per Session:   Stress:   . Feeling of Stress :   Social Connections:   . Frequency of Communication with Friends and Family:   . Frequency of Social Gatherings with Friends and Family:   . Attends Religious Services:   . Active Member of Clubs or Organizations:   . Attends Archivist Meetings:   Marland Kitchen Marital Status:   Intimate Partner Violence:   . Fear of Current or Ex-Partner:   . Emotionally Abused:   Marland Kitchen Physically Abused:   . Sexually Abused:     Outpatient Medications Prior to Visit  Medication Sig Dispense Refill  . albuterol (PROVENTIL) (2.5 MG/3ML) 0.083% nebulizer solution Take 3 mLs (2.5 mg total) by nebulization every 6 (six) hours as needed for wheezing or shortness of breath. 150 mL 1  . albuterol (VENTOLIN HFA) 108 (90 Base) MCG/ACT inhaler Inhale 1-2 puffs into the lungs every 4 (four) hours as needed for wheezing or shortness of breath. 8 g 2  . azelastine (ASTELIN) 0.1 % nasal spray Place 1 spray into both nostrils daily as needed for rhinitis. 30 mL 5  . B Complex Vitamins (VITAMIN B COMPLEX PO) Take 1 tablet by mouth daily.    . benazepril (LOTENSIN) 10 MG tablet Take 1 tablet (10 mg total) by mouth daily. 30 tablet 5  . diclofenac Sodium (VOLTAREN) 1 % GEL Apply topically.    . fluticasone furoate-vilanterol (BREO ELLIPTA) 100-25 MCG/INH AEPB Inhale 1 puff into the lungs daily. 28 each 5  . ibuprofen (ADVIL,MOTRIN) 200 MG tablet Take 400-600 mg by mouth every 6 (six) hours as needed for headache or moderate pain.    Marland Kitchen levothyroxine (SYNTHROID) 88 MCG tablet Take 1 tablet (88 mcg total) by mouth daily. 90 tablet 3  . Menthol, Topical Analgesic, (ICY HOT EX) Apply 1 application topically daily as needed (pain).    . metoprolol tartrate (LOPRESSOR) 50 MG tablet Take 1 tablet (50 mg total) by mouth 2 (two) times daily. 60 tablet 5  . traMADol (ULTRAM) 50 MG tablet Take 1-2 tablets (50-100  mg total) by mouth every 8 (eight) hours as needed for moderate pain. Maximum 6 tabs per day. 45 tablet 0  . triamcinolone (NASACORT) 55 MCG/ACT AERO nasal inhaler Place 1 spray into the nose daily as needed (rhinitis).   12  . umeclidinium bromide (INCRUSE ELLIPTA) 62.5 MCG/INH AEPB Inhale 1 puff into the lungs daily. 7 each 5  . venlafaxine XR (EFFEXOR-XR) 150 MG 24 hr capsule Take 1 capsule (150 mg total) by mouth daily with breakfast. 90 capsule 1  . ALPRAZolam (XANAX) 0.25 MG tablet Take 1 tablet (0.25 mg total) by mouth 2 (two) times daily as needed for anxiety. (Patient not taking:  Reported on 06/27/2019) 30 tablet 0   No facility-administered medications prior to visit.    Allergies  Allergen Reactions  . Influenza Virus Vacc Split Pf Swelling and Other (See Comments)    Swelling around injection site  . Gluten Meal Diarrhea  . Lipitor [Atorvastatin] Other (See Comments)    Hepatis     Review of Systems  Constitutional: Positive for activity change, chills, fatigue and fever. Negative for appetite change.  HENT: Positive for congestion, postnasal drip, rhinorrhea, sinus pressure, sinus pain and sore throat.   Respiratory: Positive for cough, chest tightness, shortness of breath and wheezing.   Cardiovascular: Negative for chest pain, palpitations and leg swelling.  Neurological: Negative for dizziness, tremors, weakness, light-headedness and headaches.  Psychiatric/Behavioral: Negative for confusion, decreased concentration and sleep disturbance.       Objective:    Physical Exam Vitals and nursing note reviewed.  Constitutional:      Appearance: Normal appearance.  HENT:     Head: Normocephalic.     Nose: Congestion and rhinorrhea present.  Eyes:     Extraocular Movements: Extraocular movements intact.     Conjunctiva/sclera: Conjunctivae normal.     Pupils: Pupils are equal, round, and reactive to light.  Cardiovascular:     Rate and Rhythm: Normal rate and regular  rhythm.     Pulses: Normal pulses.     Heart sounds: Normal heart sounds.  Pulmonary:     Effort: Pulmonary effort is normal. No respiratory distress.     Breath sounds: Examination of the right-upper field reveals wheezing. Examination of the left-upper field reveals wheezing. Wheezing present. No rhonchi.  Chest:     Chest wall: No tenderness.  Abdominal:     General: Abdomen is flat.     Palpations: Abdomen is soft.  Musculoskeletal:        General: Normal range of motion.     Cervical back: Normal range of motion.     Right lower leg: No edema.     Left lower leg: No edema.  Lymphadenopathy:     Cervical: No cervical adenopathy.  Skin:    General: Skin is warm and dry.     Capillary Refill: Capillary refill takes less than 2 seconds.  Neurological:     General: No focal deficit present.     Mental Status: She is alert and oriented to person, place, and time.  Psychiatric:        Mood and Affect: Mood normal.        Behavior: Behavior normal. Behavior is cooperative.        Thought Content: Thought content normal.        Judgment: Judgment normal.     BP 116/82   Pulse (!) 115   Temp 98.9 F (37.2 C) (Oral)   Ht 5' 4"  (1.626 m)   Wt 220 lb (99.8 kg)   SpO2 96%   BMI 37.76 kg/m  Wt Readings from Last 3 Encounters:  06/27/19 220 lb (99.8 kg)  05/10/19 218 lb 1.9 oz (98.9 kg)  03/08/19 218 lb (98.9 kg)    Health Maintenance Due  Topic Date Due  . MAMMOGRAM  02/03/2017    There are no preventive care reminders to display for this patient.   Lab Results  Component Value Date   TSH 1.41 05/17/2019   Lab Results  Component Value Date   WBC 7.3 05/17/2019   HGB 14.2 05/17/2019   HCT 41.5 05/17/2019   MCV 89.1 05/17/2019  PLT 192 05/17/2019   Lab Results  Component Value Date   NA 139 05/17/2019   K 4.3 05/17/2019   CO2 21 05/17/2019   GLUCOSE 118 (H) 05/17/2019   BUN 16 05/17/2019   CREATININE 0.97 05/17/2019   BILITOT 0.4 05/17/2019   ALKPHOS  116 04/18/2018   AST 20 05/17/2019   ALT 25 05/17/2019   PROT 6.7 05/17/2019   ALBUMIN 3.7 04/18/2018   CALCIUM 9.3 05/17/2019   ANIONGAP 11 04/18/2018   GFR 74.37 03/25/2013   Lab Results  Component Value Date   CHOL 193 05/17/2019   Lab Results  Component Value Date   HDL 41 (L) 05/17/2019   Lab Results  Component Value Date   LDLCALC 130 (H) 05/17/2019   Lab Results  Component Value Date   TRIG 117 05/17/2019   Lab Results  Component Value Date   CHOLHDL 4.7 05/17/2019   Lab Results  Component Value Date   HGBA1C 6.6 (H) 05/17/2019       Assessment & Plan:   1. Moderate persistent asthma with acute bronchitis and acute exacerbation Symptoms and presentation consistent with moderate persistent asthma with acute exacerbation related to environmental exposures at her office.  We will utilize 7-day prednisone burst and Augmentin to help with her symptoms.  Given that the patient for started with symptoms after the weather change late last week and is now running a low-grade fever and experiencing increase mucus production there definitely could be a bacterial component to her symptoms. Patient to continue using albuterol rescue inhaler and maintenance inhalers.  Discussed continuing to use a flutter valve at least twice per day and the option of trialing a massager to her back to see if that would help break up some of the mucus. Mucinex may also be utilized. Prescription refill provided for Monday Wednesday Friday dosage of azithromycin that was initiated by pulmonology.  Instructed patient to start this medication once the Augmentin is completed and to consult with pulmonology if she would like her to continue this medication at her next visit. Patient instructed on symptoms that would warrant emergency room evaluation. And to follow-up if symptoms worsen or fail to improve. - predniSONE (DELTASONE) 20 MG tablet; Take 1 tablet (20 mg total) by mouth 2 (two) times daily  with a meal. For 7 days.  Dispense: 14 tablet; Refill: 2 - amoxicillin-clavulanate (AUGMENTIN) 875-125 MG tablet; Take 1 tablet by mouth 2 (two) times daily.  Dispense: 14 tablet; Refill: 0 - azithromycin (ZITHROMAX) 250 MG tablet; 1 tab (280m) by mouth every Monday, Wednesday, Friday  Dispense: 48 tablet; Refill: 2  2. Encounter for screening mammogram for malignant neoplasm of breast Orders placed for screening mammogram - MM 3D SCREEN BREAST BILATERAL; Future  Return if symptoms worsen or fail to improve.  SOrma Render NP

## 2019-06-27 NOTE — Patient Instructions (Signed)
I have put refills on the prednisone prescription for you to have on hand. If you begin to develop to symptoms in the future, you can use this at the first sign of symptoms to help keep them in the clear.   Let me know if you don't feel any better in the next few days- we may need to change the antibiotic.

## 2019-07-17 ENCOUNTER — Ambulatory Visit (INDEPENDENT_AMBULATORY_CARE_PROVIDER_SITE_OTHER): Payer: 59 | Admitting: Sports Medicine

## 2019-07-17 DIAGNOSIS — N950 Postmenopausal bleeding: Secondary | ICD-10-CM

## 2019-07-17 DIAGNOSIS — G8929 Other chronic pain: Secondary | ICD-10-CM

## 2019-07-17 DIAGNOSIS — F418 Other specified anxiety disorders: Secondary | ICD-10-CM

## 2019-07-17 DIAGNOSIS — M25511 Pain in right shoulder: Secondary | ICD-10-CM | POA: Diagnosis not present

## 2019-07-17 DIAGNOSIS — J309 Allergic rhinitis, unspecified: Secondary | ICD-10-CM

## 2019-07-17 DIAGNOSIS — F321 Major depressive disorder, single episode, moderate: Secondary | ICD-10-CM | POA: Diagnosis not present

## 2019-07-17 HISTORY — DX: Postmenopausal bleeding: N95.0

## 2019-07-17 MED ORDER — ALPRAZOLAM 0.25 MG PO TABS: 0.2500 mg | ORAL_TABLET | Freq: Two times a day (BID) | ORAL | 0 refills | Status: DC | PRN

## 2019-07-17 MED ORDER — VENLAFAXINE HCL ER 75 MG PO CP24: 225.0000 mg | ORAL_CAPSULE | Freq: Every day | ORAL | 1 refills | Status: DC

## 2019-07-17 NOTE — Assessment & Plan Note (Signed)
Much better after subacromial injection at the last visit.

## 2019-07-17 NOTE — Assessment & Plan Note (Signed)
Currently still grieving her daughter's death. No suicidal or homicidal ideation, overall is functional. She does desire somewhat of a break from the emotions so I am going to increase her Effexor from 150 to 225 mg. Refilling alprazolam, keep further follow-up of mood disorders with PCP.

## 2019-07-17 NOTE — Assessment & Plan Note (Signed)
[name] is almost 59 years old, she has been having some postmenopausal bleeding. I would like her to see Dr. Gala Romney for consideration of endometrial biopsy versus going straight to hysterectomy. Her daughter did recently pass away from metastatic ovarian cancer.

## 2019-07-17 NOTE — Assessment & Plan Note (Signed)
Chronic perennial nonallergic rhinitis. Certainly this can contribute to asthma exacerbations, she is currently using several nasal sprays. Robinul would be an option to dry things up.

## 2019-07-17 NOTE — Progress Notes (Signed)
° ° °  Procedures performed today:    None.  Independent interpretation of notes and tests performed by another provider:   None.  Brief History, Exam, Impression, and Recommendations:    Allergic rhinitis Chronic perennial nonallergic rhinitis. Certainly this can contribute to asthma exacerbations, she is currently using several nasal sprays. Robinul would be an option to dry things up.  Chronic right shoulder pain Much better after subacromial injection at the last visit.  Postmenopausal bleeding Dorella is almost 59 years old, she has been having some postmenopausal bleeding. I would like her to see Dr. Gala Romney for consideration of endometrial biopsy versus going straight to hysterectomy. Her daughter did recently pass away from metastatic ovarian cancer.  Depression, major, single episode, moderate ([place]) Currently still grieving her daughter's death. No suicidal or homicidal ideation, overall is functional. She does desire somewhat of a break from the emotions so I am going to increase her Effexor from 150 to 225 mg. Refilling alprazolam, keep further follow-up of mood disorders with PCP.    ___________________________________________ Gwen Her. Dianah Field, M.D., ABFM., CAQSM. Primary Care and [facility] [place] Instructor of [place] [facility]

## 2019-07-18 ENCOUNTER — Encounter: Payer: Self-pay | Admitting: Nurse Practitioner

## 2019-07-18 NOTE — Telephone Encounter (Signed)
Definitely experiencing placebo response from the increase in effexor but maybe she just needed to talk about it yesterday and having something changed.

## 2019-07-30 ENCOUNTER — Other Ambulatory Visit: Payer: Self-pay | Admitting: Pulmonary Disease

## 2019-07-30 DIAGNOSIS — J411 Mucopurulent chronic bronchitis: Secondary | ICD-10-CM

## 2019-08-08 ENCOUNTER — Other Ambulatory Visit: Payer: Self-pay

## 2019-08-08 ENCOUNTER — Ambulatory Visit (INDEPENDENT_AMBULATORY_CARE_PROVIDER_SITE_OTHER): Payer: 59 | Admitting: Nurse Practitioner

## 2019-08-08 ENCOUNTER — Other Ambulatory Visit: Payer: Self-pay | Admitting: Sports Medicine

## 2019-08-08 ENCOUNTER — Encounter: Payer: Self-pay | Admitting: Nurse Practitioner

## 2019-08-08 VITALS — BP 143/90 | HR 88 | Temp 98.7°F | Ht 64.0 in | Wt 214.0 lb

## 2019-08-08 DIAGNOSIS — E785 Hyperlipidemia, unspecified: Secondary | ICD-10-CM

## 2019-08-08 DIAGNOSIS — J411 Mucopurulent chronic bronchitis: Secondary | ICD-10-CM | POA: Diagnosis not present

## 2019-08-08 DIAGNOSIS — F4321 Adjustment disorder with depressed mood: Secondary | ICD-10-CM

## 2019-08-08 DIAGNOSIS — K7581 Nonalcoholic steatohepatitis (NASH): Secondary | ICD-10-CM

## 2019-08-08 DIAGNOSIS — E1159 Type 2 diabetes mellitus with other circulatory complications: Secondary | ICD-10-CM | POA: Diagnosis not present

## 2019-08-08 DIAGNOSIS — Z634 Disappearance and death of family member: Secondary | ICD-10-CM

## 2019-08-08 DIAGNOSIS — I159 Secondary hypertension, unspecified: Secondary | ICD-10-CM

## 2019-08-08 DIAGNOSIS — F321 Major depressive disorder, single episode, moderate: Secondary | ICD-10-CM

## 2019-08-08 MED ORDER — METFORMIN HCL 500 MG PO TABS: 500.0000 mg | ORAL_TABLET | Freq: Two times a day (BID) | ORAL | 3 refills | Status: DC

## 2019-08-08 MED ORDER — ROSUVASTATIN CALCIUM 20 MG PO TABS: 20.0000 mg | ORAL_TABLET | Freq: Every day | ORAL | 2 refills | Status: DC

## 2019-08-08 NOTE — Progress Notes (Signed)
Established Patient Office Visit  Subjective:  Patient ID: [name], female    DOB: 1961-01-08  Age: 59 y.o. MRN: [id number]  CC:  Chief Complaint  Patient presents with  . URI    persistent bronchitis, productive cough of yellow mucus, intermittent disconformt when breathing during exertion, had chest CT through [facility] and is concerned regarding the results    HPI [name] presents to discuss test results performed by her pulmonologist and to determine next best course of action for her chronic health conditions.   COUGH She reports that she has had ongoing cough, with thick mucous production, for the past several weeks. She has also been experiencing shortness of breath and tightness in her chest associated with this. Her pulmonologist performed a CT scan which revealed no specific pulmonary dysfunction other than patchy attenuation in both lungs. She reports the CT did reveal atherosclerosis to her coronary arteries, hepatic steatosis, and splenomegaly. She would like to have the sputum tests her pulmonologist ordered re ordered so that she can have them run at our lab, rather than having to return to [facility] to have these done.  She also reports that she is very concerned about the incidental findings on the CT with her liver and coronary arteries. She would like to discuss next steps for these.   DM2 She does have a history of type 2 diabetes, for which she reports she used to take metformin. She stopped taking the medication several years ago. She reports that she does not check her blood sugars at home, but when it is checked with blood it typically runs in the 120's - 130's.   HLD She reports that she was on atorvastatin and pravastatin in the past for her cholesterol management, but stopped taking the medications when one caused her liver enzymes to elevate and the other caused her to have pain in her liver.   HTN She does have a history of hypertension and takes her medication on  a regular basis, but does report that she forgot to take her medication this morning and feels that is why her blood pressure is elevated today. She does not monitor her blood pressure at home on a regular basis.   GRIEF She has been under a tremendous amount of stress in the past few months following the illness and death of her daughter. She reports that she is feeling much better now with medication, but admits that she is not completely through the grieving process. She states that she finally able to deal with watching her daughter die, but her anxiety is still quite present and she often feels stressed and overwhelmed. She is planning to go to grief counseling to help her with this journey.   WEIGHT She admits that she needs to take better control of her weight management. She reports that her depression and anxiety often prevent her from having the energy to cook and she and her husband were eating unhealthy food choices due to this. Recently, they have started a pre-prepared meal plan to help reduce the burden of cooking and improve the quality and healthiness of the foods they eat. She reports that this seems to be working well. She was working out some earlier this year, but is not as consistent with it as she would like to be. She plans on working on her exercise to help with her weight and health.   She endorses shortness of breath, chest tightness, cough, increased sputum production, chest wall pain  with cough, anxiety, depression, fatigue, and decreased energy.   She denies radiation pain into her neck, shoulders, or back, nausea, vomiting, diarrhea, palpitations, perspiration, extremity weakness, peripheral edema, confusion, dysarthria, vision changes, headaches, or fevers/chills.   The 10-year ASCVD risk score Mikey Bussing DC Brooke Bonito., et al., 2013) is: 11.2%   Values used to calculate the score:     Age: 70 years     Sex: Female     Is Non-Hispanic African American: No     Diabetic: Yes      Tobacco smoker: No     Systolic Blood Pressure: 443 mmHg     Is BP treated: Yes     HDL Cholesterol: 41 mg/dL     Total Cholesterol: 193 mg/dL   Past Medical History:  Diagnosis Date  . ALLERGIC RHINITIS 04/10/2008  . Asthma    asthmatic bronchitis  . Carpal tunnel syndrome of right wrist   . DEPRESSION 08/13/2008  . Diabetes mellitus without complication ([place])   . Hepatitis    fatty liver  . HYPERLIPIDEMIA 08/04/2006  . HYPERTENSION 08/04/2006  . HYPOTHYROIDISM 08/04/2006  . Kidney injury   . LOW BACK PAIN 08/04/2006  . Neuromuscular disorder ([place])   . PERIMENOPAUSAL SYNDROME 05/08/2008  . PVD (peripheral vascular disease) ([place])    occlusive, status post bifem bypass 2007    Past Surgical History:  Procedure Laterality Date  . CARPAL TUNNEL RELEASE Right 04/05/2016   Procedure: OPEN RIGHT CARPAL TUNNEL RELEASE;  Surgeon: Jessy Oto, MD;  Location: [facility];  Service: Orthopedics;  Laterality: Right;  . CHOLECYSTECTOMY    . FEMORAL BYPASS     bifem.  Marland Kitchen HAGLAND'S DEFORMITY EXCISION    . PLANTAR FASCIA RELEASE Right 04/18/2018   Procedure: PLANTAR FASCIA RELEASE AND GASTROCNEMIUS RECESSION RIGHT;  Surgeon: Newt Minion, MD;  Location: [facility];  Service: Orthopedics;  Laterality: Right;  . TUBAL LIGATION      Family History  Problem Relation Age of Onset  . Hypertension Mother   . Heart disease Father   . Hyperlipidemia Father   . Hypertension Father   . Alcohol abuse Brother   . Diabetes Maternal Grandmother   . Alcohol abuse Maternal Uncle   . Stroke Maternal Uncle     Social History   Socioeconomic History  . Marital status: Married    Spouse name: Not on file  . Number of children: Not on file  . Years of education: Not on file  . Highest education level: Not on file  Occupational History  . Not on file  Tobacco Use  . Smoking status: Former Smoker    Quit date: 02/15/2004    Years since quitting: 15.4  . Smokeless tobacco: Never Used   Vaping Use  . Vaping Use: Never used  Substance and Sexual Activity  . Alcohol use: No  . Drug use: No  . Sexual activity: Yes    Birth control/protection: Post-menopausal  Other Topics Concern  . Not on file  Social History Narrative  . Not on file   Social Determinants of Health   Financial Resource Strain:   . Difficulty of Paying Living Expenses:   Food Insecurity:   . Worried About Charity fundraiser in the Last Year:   . Arboriculturist in the Last Year:   Transportation Needs:   . Film/video editor (Medical):   Marland Kitchen Lack of Transportation (Non-Medical):   Physical Activity:   . Days of Exercise per  Week:   . Minutes of Exercise per Session:   Stress:   . Feeling of Stress :   Social Connections:   . Frequency of Communication with Friends and Family:   . Frequency of Social Gatherings with Friends and Family:   . Attends Religious Services:   . Active Member of Clubs or Organizations:   . Attends Archivist Meetings:   Marland Kitchen Marital Status:   Intimate Partner Violence:   . Fear of Current or Ex-Partner:   . Emotionally Abused:   Marland Kitchen Physically Abused:   . Sexually Abused:     Outpatient Medications Prior to Visit  Medication Sig Dispense Refill  . albuterol (PROVENTIL) (2.5 MG/3ML) 0.083% nebulizer solution Take 3 mLs (2.5 mg total) by nebulization every 6 (six) hours as needed for wheezing or shortness of breath. 150 mL 1  . albuterol (VENTOLIN HFA) 108 (90 Base) MCG/ACT inhaler Inhale 1-2 puffs into the lungs every 4 (four) hours as needed for wheezing or shortness of breath. 8 g 2  . ALPRAZolam (XANAX) 0.25 MG tablet Take 1 tablet (0.25 mg total) by mouth 2 (two) times daily as needed for anxiety. 30 tablet 0  . azelastine (ASTELIN) 0.1 % nasal spray Place 1 spray into both nostrils daily as needed for rhinitis. 30 mL 5  . B Complex Vitamins (VITAMIN B COMPLEX PO) Take 1 tablet by mouth daily.    . benazepril (LOTENSIN) 10 MG tablet Take 1 tablet (10  mg total) by mouth daily. 30 tablet 5  . diclofenac Sodium (VOLTAREN) 1 % GEL Apply topically.    . fluticasone furoate-vilanterol (BREO ELLIPTA) 100-25 MCG/INH AEPB Inhale 1 puff into the lungs daily. 28 each 5  . ibuprofen (ADVIL,MOTRIN) 200 MG tablet Take 400-600 mg by mouth every 6 (six) hours as needed for headache or moderate pain.    Marland Kitchen levothyroxine (SYNTHROID) 88 MCG tablet Take 1 tablet (88 mcg total) by mouth daily. 90 tablet 3  . Menthol, Topical Analgesic, (ICY HOT EX) Apply 1 application topically daily as needed (pain).    . metoprolol tartrate (LOPRESSOR) 50 MG tablet Take 1 tablet (50 mg total) by mouth 2 (two) times daily. 60 tablet 5  . umeclidinium bromide (INCRUSE ELLIPTA) 62.5 MCG/INH AEPB Inhale 1 puff into the lungs daily. 7 each 5  . venlafaxine XR (EFFEXOR XR) 75 MG 24 hr capsule Take 3 capsules (225 mg total) by mouth daily with breakfast. 90 capsule 1  . predniSONE (DELTASONE) 20 MG tablet Take 1 tablet (20 mg total) by mouth 2 (two) times daily with a meal. For 7 days. (Patient not taking: Reported on 08/08/2019) 14 tablet 2  . triamcinolone (NASACORT) 55 MCG/ACT AERO nasal inhaler Place 1 spray into the nose daily as needed (rhinitis).  (Patient not taking: Reported on 08/08/2019)  12   No facility-administered medications prior to visit.    Allergies  Allergen Reactions  . Influenza Virus Vacc Split Pf Swelling and Other (See Comments)    Swelling around injection site  . Gluten Meal Diarrhea  . Lipitor [Atorvastatin] Other (See Comments)    Hepatis       Objective:    Physical Exam Vitals and nursing note reviewed.  Constitutional:      General: She is not in acute distress.    Appearance: Normal appearance. She is obese. She is not diaphoretic.  HENT:     Head: Normocephalic.     Mouth/Throat:     Mouth: Mucous membranes are moist.  Pharynx: Oropharynx is clear.  Eyes:     Extraocular Movements: Extraocular movements intact.      Conjunctiva/sclera: Conjunctivae normal.     Pupils: Pupils are equal, round, and reactive to light.  Neck:     Vascular: No carotid bruit.  Cardiovascular:     Rate and Rhythm: Normal rate and regular rhythm.     Chest Wall: PMI is not displaced.     Pulses: Normal pulses.     Heart sounds: Normal heart sounds, S1 normal and S2 normal.  Pulmonary:     Effort: Pulmonary effort is normal.     Breath sounds: Examination of the left-middle field reveals wheezing. Examination of the left-lower field reveals wheezing. Wheezing present.  Abdominal:     General: Abdomen is flat. Bowel sounds are normal.     Palpations: Abdomen is soft.  Musculoskeletal:        General: Normal range of motion.     Cervical back: Normal range of motion.     Right lower leg: No edema.     Left lower leg: No edema.  Skin:    General: Skin is warm and dry.     Capillary Refill: Capillary refill takes less than 2 seconds.  Neurological:     General: No focal deficit present.     Mental Status: She is alert and oriented to person, place, and time.     Motor: No weakness.     Coordination: Coordination normal.     Gait: Gait normal.  Psychiatric:        Mood and Affect: Mood normal.        Behavior: Behavior normal.        Thought Content: Thought content normal.        Judgment: Judgment normal.     BP (!) 143/90   Pulse 88   Temp 98.7 F (37.1 C) (Oral)   Ht 5' 4"  (1.626 m)   Wt 214 lb (97.1 kg)   SpO2 99%   BMI 36.73 kg/m  Wt Readings from Last 3 Encounters:  08/08/19 214 lb (97.1 kg)  06/27/19 220 lb (99.8 kg)  05/10/19 218 lb 1.9 oz (98.9 kg)     Health Maintenance Due  Topic Date Due  . MAMMOGRAM  02/03/2017    There are no preventive care reminders to display for this patient.  Lab Results  Component Value Date   TSH 1.41 05/17/2019   Lab Results  Component Value Date   WBC 7.3 05/17/2019   HGB 14.2 05/17/2019   HCT 41.5 05/17/2019   MCV 89.1 05/17/2019   PLT 192  05/17/2019   Lab Results  Component Value Date   NA 139 05/17/2019   K 4.3 05/17/2019   CO2 21 05/17/2019   GLUCOSE 118 (H) 05/17/2019   BUN 16 05/17/2019   CREATININE 0.97 05/17/2019   BILITOT 0.4 05/17/2019   ALKPHOS 116 04/18/2018   AST 20 05/17/2019   ALT 25 05/17/2019   PROT 6.7 05/17/2019   ALBUMIN 3.7 04/18/2018   CALCIUM 9.3 05/17/2019   ANIONGAP 11 04/18/2018   GFR 74.37 03/25/2013   Lab Results  Component Value Date   CHOL 193 05/17/2019   Lab Results  Component Value Date   HDL 41 (L) 05/17/2019   Lab Results  Component Value Date   LDLCALC 130 (H) 05/17/2019   Lab Results  Component Value Date   TRIG 117 05/17/2019   Lab Results  Component Value Date   CHOLHDL  4.7 05/17/2019   Lab Results  Component Value Date   HGBA1C 6.6 (H) 05/17/2019      Assessment & Plan:   1. Type 2 diabetes mellitus with vascular disease ([place]) Her most recent A1c was 6.6% in April of this year. At that time, she was working diet and exercise to help lower her numbers. Given the recent finding of coronary atherosclerosis and serohepatitis, a joint decision was made to try to manage her diabetes more aggressively with medication in addition to diet and exercise. She has tolerated metformin in the past, therefore, I feel this would be a good starting point for her. We will plan to recheck her Hgb A1c, lipids, and CMP in approximately 6 weeks after she has had a chance to start medication and work on her dietary goals. Her recent EKG was normal, but given her co-morbidities and chronic cough, I feel she would benefit from a cardiology referral and possible echocardiogram.  PLAN: - Start metformin 500 mg once a day for 7 days then increase to 500 mg twice a day. Will consider changing to XR dose of medication based on tolerance at next visit.  - Aggressive control of diet and exercise to help reduce risk factors of further cardiovascular disease.  - Referral to cardiology placed.   - Follow-up in 6 weeks with labs   - metFORMIN (GLUCOPHAGE) 500 MG tablet; Take 1 tablet (500 mg total) by mouth 2 (two) times daily with a meal. Take 1 tab (560m) once a day for the first week then increase to 1 tab (5049m twice a day with meals.  Dispense: 180 tablet; Refill: 3 - Ambulatory referral to Cardiology - Lipid panel; Future - COMPLETE METABOLIC PANEL WITH GFR; Future - Hemoglobin A1c; Future  2. Nonalcoholic steatohepatitis ([facility]) New diagnosis of steatohepatitis found incidentally on CT. Her labs in April of this year did not reveal an elevation of ALT or AST. We discussed the importance of working on diet and exercise to help with this condition. We will plan to aggressively manage diabetes, hypertension, and hyperlipidemia along with weight management. Will need to monitor lab levels in 6 weeks to ensure that her liver enzymes are not elevating with the addition of rosuvastatin for HLD.   PLAN: - Aggressive management of weight, diabetes, HTN, and HLD - Monitor labs in 6 weeks - Consider ultrasound with elastography in 6 months    3. Dyslipidemia Decreased HDL and elevated LDL in the setting of DM2, HTN, NASH, obesity, and CAD. She has been unable to tolerate statin therapy in the past due to elevated liver enzymes- plan to monitor closely. Aggressive diet and exercise to aid with management. Will plan to recheck liver enzymes and lipids in 6 weeks.   PLAN: - Start rosuvastatin 2070maily - Will plan to monitor lipids and liver function in 6 weeks.  - Aggressive diet and exercise to help manage cholesterol - Refer to cardiology placed for evaluation   - rosuvastatin (CRESTOR) 20 MG tablet; Take 1 tablet (20 mg total) by mouth daily.  Dispense: 30 tablet; Refill: 2 - Ambulatory referral to Cardiology - Lipid panel; Future - COMPLETE METABOLIC PANEL WITH GFR; Future  4. Chronic bronchitis with productive mucopurulent cough (HCC) Chronic productive cough for several  weeks. Will order sputum cultures recommended by pulmonologist of mycobacterium sputum culture, fungal sputum culture, and respiratory sputum panel.  Recent CT revealed  "1. No focal consolidation, suspicious mass, or significant bronchiectasis.  2. Extremely subtle patchy groundglass attenuation  throughout both lungs, nonspecific. Consider correlation with pulmonary function testing.  3. Extensive atherosclerotic coronary artery calcification.  4. Severe hepatic steatosis. Mild splenomegaly." Will follow along with pulmonologist and assist in care as needed. We can plan to do PFT at her next visit if not performed by pulmonologist before that time.  PLAN: - Continue with current treatment regimen recommended by pulmonology. - Referral to cardiology to evaluate if some symptoms could be cardiac related as opposed to all pulmonary. - Will notify patient of lab results once they are received - Plan for PFT in the office in the next 3 months if not performed by pulmonology before that time.      - Respiratory or Resp and Sputum Culture - Other/Misc lab test - Other/Misc lab test  5. Morbid obesity (HCC) BMI 36.73 with co-morbidities of DM2, HLD, HTN, NASH, and CAD. She has made changes to her diet and I am hoping with improved glycemic control she will be able to lose some weight to decrease her risks of morbidity and mortality. Consider specialized exercise program to help with weight loss goals and overall improvement of health status. Will wait for input from cardiology before starting any aggressive regimen. She may benefit from the addition of a medication, such as liraglutide, to help with her diabetes and weight. Will consider this in the future.   PLAN: - Control co-morbid conditions with medication, dietary changes, and moderate exercise.  - Plan to hold off on detailed exercise plan until she has been evaluated by cardiology - Consider adding liraglutide to help better manage diabetes  and weight  6. Grief at loss of child Anxiety and depression exacerbated by the recent loss of her daughter. She is doing better with increased medication dosages and time. Discussed the value of therapy and grief counseling to help with the healing process and daily difficulties associated with loss. No changes in medication regimen today.   PLAN: -Continue current medication regimen - Consider grief counseling and/or therapy  7. Secondary hypertension Hypertension with elevated blood pressure reading in the office today. She has historically been well controlled on her current medication regimen of benazepril and metoprolol. The elevation in blood pressure today is likely due to missed dose of medication and increased anxiety associated with her recent test results. Given her co-morbidities, aggressive management of her blood pressure is necessary. Cardiology input would be helpful and much appreciated.   PLAN: -Continue current medication regimen - Monitor blood pressure at home and reports readings >140/90 - Referral to cardiology placed   Follow-up in 6 weeks for labs and in 3 months for in person evaluation or sooner if needed.   Orma Render, NP

## 2019-08-08 NOTE — Telephone Encounter (Signed)
Medication refill request  Last refill 07/17/2019 Last OV- 05/10/2019

## 2019-08-08 NOTE — Patient Instructions (Addendum)
Lets plan to recheck your labs in about 6 weeks to see how your liver and cholesterol are looking.   I have placed the referral for cardiology- expect a call from them in the next week.     DASH Eating Plan DASH stands for "Dietary Approaches to Stop Hypertension." The DASH eating plan is a healthy eating plan that has been shown to reduce high blood pressure (hypertension). It may also reduce your risk for type 2 diabetes, heart disease, and stroke. The DASH eating plan may also help with weight loss. What are tips for following this plan?  General guidelines  Avoid eating more than 2,300 mg (milligrams) of salt (sodium) a day. If you have hypertension, you may need to reduce your sodium intake to 1,500 mg a day.  Limit alcohol intake to no more than 1 drink a day for nonpregnant women and 2 drinks a day for men. One drink equals 12 oz of beer, 5 oz of wine, or 1 oz of hard liquor.  Work with your health care provider to maintain a healthy body weight or to lose weight. Ask what an ideal weight is for you.  Get at least 30 minutes of exercise that causes your heart to beat faster (aerobic exercise) most days of the week. Activities may include walking, swimming, or biking.  Work with your health care provider or diet and nutrition specialist (dietitian) to adjust your eating plan to your individual calorie needs. Reading food labels   Check food labels for the amount of sodium per serving. Choose foods with less than 5 percent of the Daily Value of sodium. Generally, foods with less than 300 mg of sodium per serving fit into this eating plan.  To find whole grains, look for the word "whole" as the first word in the ingredient list. Shopping  Buy products labeled as "low-sodium" or "no salt added."  Buy fresh foods. Avoid canned foods and premade or frozen meals. Cooking  Avoid adding salt when cooking. Use salt-free seasonings or herbs instead of table salt or sea salt. Check with  your health care provider or pharmacist before using salt substitutes.  Do not fry foods. Cook foods using healthy methods such as baking, boiling, grilling, and broiling instead.  Cook with heart-healthy oils, such as olive, canola, soybean, or sunflower oil. Meal planning  Eat a balanced diet that includes: ? 5 or more servings of fruits and vegetables each day. At each meal, try to fill half of your plate with fruits and vegetables. ? Up to 6-8 servings of whole grains each day. ? Less than 6 oz of lean meat, poultry, or fish each day. A 3-oz serving of meat is about the same size as a deck of cards. One egg equals 1 oz. ? 2 servings of low-fat dairy each day. ? A serving of nuts, seeds, or beans 5 times each week. ? Heart-healthy fats. Healthy fats called Omega-3 fatty acids are found in foods such as flaxseeds and coldwater fish, like sardines, salmon, and mackerel.  Limit how much you eat of the following: ? Canned or prepackaged foods. ? Food that is high in trans fat, such as fried foods. ? Food that is high in saturated fat, such as fatty meat. ? Sweets, desserts, sugary drinks, and other foods with added sugar. ? Full-fat dairy products.  Do not salt foods before eating.  Try to eat at least 2 vegetarian meals each week.  Eat more home-cooked food and less restaurant, buffet,  and fast food.  When eating at a restaurant, ask that your food be prepared with less salt or no salt, if possible. What foods are recommended? The items listed may not be a complete list. Talk with your dietitian about what dietary choices are best for you. Grains Whole-grain or whole-wheat bread. Whole-grain or whole-wheat pasta. Brown rice. [place]. Bulgur. Whole-grain and low-sodium cereals. Pita bread. Low-fat, low-sodium crackers. Whole-wheat flour tortillas. Vegetables Fresh or frozen vegetables (raw, steamed, roasted, or grilled). Low-sodium or reduced-sodium tomato and vegetable  juice. Low-sodium or reduced-sodium tomato sauce and tomato paste. Low-sodium or reduced-sodium canned vegetables. Fruits All fresh, dried, or frozen fruit. Canned fruit in natural juice (without added sugar). Meat and other protein foods Skinless chicken or [place]. Ground chicken or [place]. Pork with fat trimmed off. Fish and seafood. Egg whites. Dried beans, peas, or lentils. Unsalted nuts, nut butters, and seeds. Unsalted canned beans. Lean cuts of beef with fat trimmed off. Low-sodium, lean deli meat. Dairy Low-fat (1%) or fat-free (skim) milk. Fat-free, low-fat, or reduced-fat cheeses. Nonfat, low-sodium ricotta or cottage cheese. Low-fat or nonfat yogurt. Low-fat, low-sodium cheese. Fats and oils Soft margarine without trans fats. Vegetable oil. Low-fat, reduced-fat, or light mayonnaise and salad dressings (reduced-sodium). Canola, safflower, olive, soybean, and sunflower oils. Avocado. Seasoning and other foods Herbs. Spices. Seasoning mixes without salt. Unsalted popcorn and pretzels. Fat-free sweets. What foods are not recommended? The items listed may not be a complete list. Talk with your dietitian about what dietary choices are best for you. Grains Baked goods made with fat, such as croissants, muffins, or some breads. Dry pasta or rice meal packs. Vegetables Creamed or fried vegetables. Vegetables in a cheese sauce. Regular canned vegetables (not low-sodium or reduced-sodium). Regular canned tomato sauce and paste (not low-sodium or reduced-sodium). Regular tomato and vegetable juice (not low-sodium or reduced-sodium). Angie Fava. Olives. Fruits Canned fruit in a light or heavy syrup. Fried fruit. Fruit in cream or butter sauce. Meat and other protein foods Fatty cuts of meat. Ribs. Fried meat. Berniece Salines. Sausage. Bologna and other processed lunch meats. Salami. Fatback. Hotdogs. Bratwurst. Salted nuts and seeds. Canned beans with added salt. Canned or smoked fish. Whole eggs or egg yolks.  Chicken or [place] with skin. Dairy Whole or 2% milk, cream, and half-and-half. Whole or full-fat cream cheese. Whole-fat or sweetened yogurt. Full-fat cheese. Nondairy creamers. Whipped toppings. Processed cheese and cheese spreads. Fats and oils Butter. Stick margarine. Lard. Shortening. Ghee. Bacon fat. Tropical oils, such as coconut, palm kernel, or palm oil. Seasoning and other foods Salted popcorn and pretzels. Onion salt, garlic salt, seasoned salt, table salt, and sea salt. Worcestershire sauce. Tartar sauce. Barbecue sauce. Teriyaki sauce. Soy sauce, including reduced-sodium. Steak sauce. Canned and packaged gravies. Fish sauce. Oyster sauce. Cocktail sauce. Horseradish that you find on the shelf. Ketchup. Mustard. Meat flavorings and tenderizers. Bouillon cubes. Hot sauce and Tabasco sauce. Premade or packaged marinades. Premade or packaged taco seasonings. Relishes. Regular salad dressings. Where to find more information:  National Heart, Lung, and [place]: https://wilson-eaton.com/  American Heart Association: www.heart.org Summary  The DASH eating plan is a healthy eating plan that has been shown to reduce high blood pressure (hypertension). It may also reduce your risk for type 2 diabetes, heart disease, and stroke.  With the DASH eating plan, you should limit salt (sodium) intake to 2,300 mg a day. If you have hypertension, you may need to reduce your sodium intake to 1,500 mg a day.  When on  the DASH eating plan, aim to eat more fresh fruits and vegetables, whole grains, lean proteins, low-fat dairy, and heart-healthy fats.  Work with your health care provider or diet and nutrition specialist (dietitian) to adjust your eating plan to your individual calorie needs. This information is not intended to replace advice given to you by your health care provider. Make sure you discuss any questions you have with your health care provider. Document Revised: 01/13/2017 Document Reviewed:  01/25/2016 Elsevier Patient Education  [place].   Rosuvastatin Tablets What is this medicine? ROSUVASTATIN (roe SOO va sta tin) is known as a HMG-CoA reductase inhibitor or 'statin'. It lowers cholesterol and triglycerides in the blood. This drug may also reduce the risk of heart attack, stroke, or other health problems in patients with risk factors for heart disease. Diet and lifestyle changes are often used with this drug. This medicine may be used for other purposes; ask your health care provider or pharmacist if you have questions. COMMON BRAND NAME(S): Crestor What should I tell my health care provider before I take this medicine? They need to know if you have any of these conditions:  diabetes  if you often drink alcohol  history of stroke  kidney disease  liver disease  muscle aches or weakness  thyroid disease  an unusual or allergic reaction to rosuvastatin, other medicines, foods, dyes, or preservatives  pregnant or trying to get pregnant  breast-feeding How should I use this medicine? Take this medicine by mouth with a glass of water. Follow the directions on the prescription label. Do not cut, crush or chew this medicine. You can take this medicine with or without food. Take your doses at regular intervals. Do not take your medicine more often than directed. Talk to your pediatrician regarding the use of this medicine in children. While this drug may be prescribed for children as young as 24 years old for selected conditions, precautions do apply. Overdosage: If you think you have taken too much of this medicine contact a poison control center or emergency room at once. NOTE: This medicine is only for you. Do not share this medicine with others. What if I miss a dose? If you miss a dose, take it as soon as you can. If your next dose is to be taken in less than 12 hours, then do not take the missed dose. Take the next dose at your regular time. Do not take  double or extra doses. What may interact with this medicine? Do not take this medicine with any of the following medications:  herbal medicines like red yeast rice This medicine may also interact with the following medications:  alcohol  antacids containing aluminum hydroxide or magnesium hydroxide  cyclosporine  other medicines for high cholesterol  some medicines for HIV infection  warfarin This list may not describe all possible interactions. Give your health care provider a list of all the medicines, herbs, non-prescription drugs, or dietary supplements you use. Also tell them if you smoke, drink alcohol, or use illegal drugs. Some items may interact with your medicine. What should I watch for while using this medicine? Visit your doctor or health care professional for regular check-ups. You may need regular tests to make sure your liver is working properly. Your health care professional may tell you to stop taking this medicine if you develop muscle problems. If your muscle problems do not go away after stopping this medicine, contact your health care professional. Do not become pregnant while  taking this medicine. Women should inform their health care professional if they wish to become pregnant or think they might be pregnant. There is a potential for serious side effects to an unborn child. Talk to your health care professional or pharmacist for more information. Do not breast-feed an infant while taking this medicine. This medicine may increase blood sugar. Ask your healthcare provider if changes in diet or medicines are needed if you have diabetes. If you are going to need surgery or other procedure, tell your doctor that you are using this medicine. This drug is only part of a total heart-health program. Your doctor or a dietician can suggest a low-cholesterol and low-fat diet to help. Avoid alcohol and smoking, and keep a proper exercise schedule. This medicine may cause a  decrease in Co-Enzyme Q-10. You should make sure that you get enough Co-Enzyme Q-10 while you are taking this medicine. Discuss the foods you eat and the vitamins you take with your health care professional. What side effects may I notice from receiving this medicine? Side effects that you should report to your doctor or health care professional as soon as possible:  allergic reactions like skin rash, itching or hives, swelling of the face, lips, or tongue  confusion  joint pain  loss of memory  redness, blistering, peeling or loosening of the skin, including inside the mouth  signs and symptoms of high blood sugar such as being more thirsty or hungry or having to urinate more than normal. You may also feel very tired or have blurry vision.  signs and symptoms of muscle injury like dark urine; trouble passing urine or change in the amount of urine; unusually weak or tired; muscle pain or side or back pain  yellowing of the eyes or skin Side effects that usually do not require medical attention (report to your doctor or health care professional if they continue or are bothersome):  constipation  diarrhea  dizziness  gas  headache  nausea  stomach pain  trouble sleeping  upset stomach This list may not describe all possible side effects. Call your doctor for medical advice about side effects. You may report side effects to FDA at 1-800-FDA-1088. Where should I keep my medicine? Keep out of the reach of children. Store at room temperature between 20 and 25 degrees C (68 and 77 degrees F). Keep container tightly closed (protect from moisture). Throw away any unused medicine after the expiration date. NOTE: This sheet is a summary. It may not cover all possible information. If you have questions about this medicine, talk to your doctor, pharmacist, or health care provider.  2020 Elsevier/Gold Standard (2017-11-23 08:25:08)

## 2019-08-14 ENCOUNTER — Encounter: Payer: Self-pay | Admitting: Nurse Practitioner

## 2019-08-14 ENCOUNTER — Other Ambulatory Visit: Payer: Self-pay | Admitting: Nurse Practitioner

## 2019-08-14 DIAGNOSIS — R0602 Shortness of breath: Secondary | ICD-10-CM

## 2019-08-14 DIAGNOSIS — R5382 Chronic fatigue, unspecified: Secondary | ICD-10-CM

## 2019-08-14 DIAGNOSIS — R0683 Snoring: Secondary | ICD-10-CM

## 2019-08-14 DIAGNOSIS — J411 Mucopurulent chronic bronchitis: Secondary | ICD-10-CM

## 2019-08-14 NOTE — Progress Notes (Signed)
Referral for sleep study placed at request of pulmonologist due to chronic fatigue, chronic cough, GERD with lung involvement, and exacerbation of Asthma in the setting of nightly snoring.

## 2019-08-16 ENCOUNTER — Other Ambulatory Visit: Payer: Self-pay | Admitting: Nurse Practitioner

## 2019-08-16 ENCOUNTER — Telehealth (INDEPENDENT_AMBULATORY_CARE_PROVIDER_SITE_OTHER): Payer: 59 | Admitting: Nurse Practitioner

## 2019-08-16 ENCOUNTER — Encounter: Payer: Self-pay | Admitting: Nurse Practitioner

## 2019-08-16 DIAGNOSIS — J150 Pneumonia due to Klebsiella pneumoniae: Secondary | ICD-10-CM

## 2019-08-16 MED ORDER — AMOXICILLIN-POT CLAVULANATE 875-125 MG PO TABS: 1.0000 | ORAL_TABLET | Freq: Two times a day (BID) | ORAL | 0 refills | Status: DC

## 2019-08-16 NOTE — Progress Notes (Signed)
Klebsiella oxytoca growth in sputum culture. Augmentin twice a day for 10 days called to pharmacy.   Fungal smear still pending. No growth shown as of last update.

## 2019-08-20 ENCOUNTER — Other Ambulatory Visit: Payer: Self-pay | Admitting: Nurse Practitioner

## 2019-08-20 DIAGNOSIS — R5382 Chronic fatigue, unspecified: Secondary | ICD-10-CM

## 2019-08-20 NOTE — Progress Notes (Signed)
Fungal smear shows scant growth of yeast. This could be contamination for the oropharynx. Please fax results to Dr. Lawerance Cruel for review.

## 2019-08-30 DIAGNOSIS — M25511 Pain in right shoulder: Secondary | ICD-10-CM

## 2019-08-30 DIAGNOSIS — M5030 Other cervical disc degeneration, unspecified cervical region: Secondary | ICD-10-CM | POA: Insufficient documentation

## 2019-08-30 DIAGNOSIS — G8929 Other chronic pain: Secondary | ICD-10-CM

## 2019-08-30 NOTE — Assessment & Plan Note (Signed)
[name] also has neck pain with radiation into her right shoulder, she has known cervical degenerative disc disease, she has had x-rays that show DDD, proceeding with MRI for interventional planning.

## 2019-08-30 NOTE — Assessment & Plan Note (Signed)
[name] is now having a recurrence of her right shoulder pain, impingement, she had an injection about a month and a half ago, due to recurrence of symptoms in spite of greater than 6 weeks of physician directed conservative measures we are going to proceed with an MRI for surgical planning.

## 2019-09-08 ENCOUNTER — Ambulatory Visit (INDEPENDENT_AMBULATORY_CARE_PROVIDER_SITE_OTHER): Payer: 59

## 2019-09-08 ENCOUNTER — Other Ambulatory Visit: Payer: Self-pay

## 2019-09-08 DIAGNOSIS — M5030 Other cervical disc degeneration, unspecified cervical region: Secondary | ICD-10-CM | POA: Diagnosis not present

## 2019-09-08 DIAGNOSIS — M25511 Pain in right shoulder: Secondary | ICD-10-CM

## 2019-09-08 DIAGNOSIS — G8929 Other chronic pain: Secondary | ICD-10-CM

## 2019-09-09 ENCOUNTER — Other Ambulatory Visit (HOSPITAL_COMMUNITY)
Admission: RE | Admit: 2019-09-09 | Discharge: 2019-09-09 | Disposition: A | Discharge status: Home / Self Care | Payer: 59 | Source: Ambulatory Visit | Attending: Obstetrics & Gynecology | Admitting: Obstetrics & Gynecology

## 2019-09-09 ENCOUNTER — Encounter: Payer: Self-pay | Admitting: Obstetrics & Gynecology

## 2019-09-09 ENCOUNTER — Ambulatory Visit (INDEPENDENT_AMBULATORY_CARE_PROVIDER_SITE_OTHER): Payer: 59 | Admitting: Obstetrics & Gynecology

## 2019-09-09 ENCOUNTER — Other Ambulatory Visit: Payer: Self-pay | Admitting: Obstetrics & Gynecology

## 2019-09-09 VITALS — BP 154/94 | HR 99 | Ht 64.0 in | Wt 211.0 lb

## 2019-09-09 DIAGNOSIS — Z01419 Encounter for gynecological examination (general) (routine) without abnormal findings: Secondary | ICD-10-CM | POA: Diagnosis not present

## 2019-09-09 DIAGNOSIS — N950 Postmenopausal bleeding: Secondary | ICD-10-CM | POA: Diagnosis not present

## 2019-09-09 DIAGNOSIS — Z0000 Encounter for general adult medical examination without abnormal findings: Secondary | ICD-10-CM | POA: Diagnosis present

## 2019-09-09 LAB — RESPIRATORY CULTURE OR RESPIRATORY AND SPUTUM CULTURE
MICRO NUMBER:: 10641930
RESULT:: NORMAL
SPECIMEN QUALITY:: ADEQUATE

## 2019-09-09 LAB — FUNGUS CULTURE W SMEAR
MICRO NUMBER:: 10641929
SMEAR:: NONE SEEN
SPECIMEN QUALITY:: ADEQUATE

## 2019-09-09 NOTE — Progress Notes (Signed)
Pt had bleeding in April

## 2019-09-09 NOTE — Progress Notes (Signed)
° °  Subjective:    Patient ID: [name], female    DOB: [birth year], 59 y.o.   MRN: [id number]  HPI  59 yo female presents for PMB.  She has had two episodes.  The last episode in April she bled for 10 days.  Pt went into menopause at age 59.  No abdominal pain.  Pt has diarrhea--gluten intolerance.  No constipation. Pt sometimes has spotting after intercouse--intercourse is painful.  Improved with lubrication.  No history of HRT.  Daughter recently died from clear cell ovarian cancer.    Review of Systems  Constitutional: Negative.   Respiratory: Negative.   Cardiovascular: Negative.   Gastrointestinal: Negative.   Genitourinary: Positive for vaginal bleeding.  Psychiatric/Behavioral: Negative.        Objective:   Physical Exam Vitals reviewed.  Constitutional:      General: She is not in acute distress.    Appearance: She is well-developed.  HENT:     Head: Normocephalic and atraumatic.  Eyes:     Conjunctiva/sclera: Conjunctivae normal.  Cardiovascular:     Rate and Rhythm: Normal rate.  Pulmonary:     Effort: Pulmonary effort is normal.  Genitourinary:    Comments: Tanner V Vulva:  Small brown macules on inner right labia majora--pt states they have been there for years with out change.   Vagina:  Pink, no lesions, no discharge, no blood Cervix:  No CMT, no visual or palpable lesions Uterus:  Non tender, mobile, limited by habitus Right adnexa--non tender, no mass, limited by habitus Left adnexa--non tender, no mass, limited by habitus  Skin:    General: Skin is warm and dry.  Neurological:     Mental Status: She is alert and oriented to person, place, and time.    Vitals:   09/09/19 1333  BP: (!) 154/94  Pulse: 99  Weight: (!) 211 lb (95.7 kg)  Height: 5' 4"  (1.626 m)       Assessment & Plan:  59 yo female presents with PMB and post coital spotting.  1.  Endometrial biopsy and [place] complete pus TVUS.  ENDOMETRIAL BIOPSY     The indications for  endometrial biopsy were reviewed.   Risks of the biopsy including cramping, bleeding, infection, uterine perforation, inadequate specimen and need for additional procedures  were discussed. The patient states she understands and agrees to undergo procedure today. Consent was signed. Time out was performed. Urine HCG was negative. A sterile speculum was placed in the patient's vagina and the cervix was prepped with Betadine. A single-toothed tenaculum was placed on the anterior lip of the cervix to stabilize it. The 3 mm pipelle was introduced into the endometrial cavity without difficulty to a depth of 7 cm, and a moderate amount of tissue was obtained and sent to pathology. The instruments were removed from the patient's vagina. Minimal bleeding from the cervix was noted. The patient tolerated the procedure well. Routine post-procedure instructions were given to the patient. The patient will follow up to review the results and for further management.    2.  Pap smear done--has been a very long time.  Mammogram ordered.  3.  Will base treatment on results.    35 minutes spent face-to-face with patient, review of daughters record (deceased from clear cell ovarian cancer), and documentation.

## 2019-09-10 LAB — CYTOLOGY - PAP
Comment: NEGATIVE
Diagnosis: NEGATIVE
High risk HPV: NEGATIVE

## 2019-09-10 NOTE — Progress Notes (Signed)
Referring-Sara Early, NP Reason for referral-chest pain and coronary calcification noted on CT scan.  HPI: 59 year old female for evaluation of chest pain and coronary calcification noted on CT scan.  Note patient followed at [facility] for severe asthma.  Holter monitor November 2017 showed sinus rhythm with PACs.  Chest CT June 2021 performed at [facility] showed extensive atherosclerotic coronary artery calcification and hepatic steatosis.  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  Patient has dyspnea with more vigorous activities but not routine activities.  No orthopnea, PND or pedal edema.  She denies chest pain or syncope.  Cardiology now asked to evaluate.  Current Outpatient Medications  Medication Sig Dispense Refill   albuterol (PROVENTIL) (2.5 MG/3ML) 0.083% nebulizer solution Take 3 mLs (2.5 mg total) by nebulization every 6 (six) hours as needed for wheezing or shortness of breath. 150 mL 1   albuterol (VENTOLIN HFA) 108 (90 Base) MCG/ACT inhaler Inhale 1-2 puffs into the lungs every 4 (four) hours as needed for wheezing or shortness of breath. 8 g 2   ALPRAZolam (XANAX) 0.25 MG tablet Take 1 tablet (0.25 mg total) by mouth 2 (two) times daily as needed for anxiety. 30 tablet 0   azelastine (ASTELIN) 0.1 % nasal spray Place 1 spray into both nostrils daily as needed for rhinitis. 30 mL 5   B Complex Vitamins (VITAMIN B COMPLEX PO) Take 1 tablet by mouth daily.     benazepril (LOTENSIN) 10 MG tablet Take 1 tablet (10 mg total) by mouth daily. 30 tablet 5   diclofenac Sodium (VOLTAREN) 1 % GEL Apply topically.     fluticasone furoate-vilanterol (BREO ELLIPTA) 100-25 MCG/INH AEPB Inhale 1 puff into the lungs daily. 28 each 5   ibuprofen (ADVIL,MOTRIN) 200 MG tablet Take 400-600 mg by mouth every 6 (six) hours as needed for headache or moderate pain.     levothyroxine (SYNTHROID) 88 MCG tablet Take 1 tablet (88 mcg total) by mouth daily. 90 tablet 3    Menthol, Topical Analgesic, (ICY HOT EX) Apply 1 application topically daily as needed (pain).     metFORMIN (GLUCOPHAGE) 500 MG tablet Take 1 tablet (500 mg total) by mouth 2 (two) times daily with a meal. Take 1 tab (550m) once a day for the first week then increase to 1 tab (5056m twice a day with meals. 180 tablet 3   metoprolol tartrate (LOPRESSOR) 50 MG tablet Take 1 tablet (50 mg total) by mouth 2 (two) times daily. 60 tablet 5   rosuvastatin (CRESTOR) 20 MG tablet Take 1 tablet (20 mg total) by mouth daily. 30 tablet 2   umeclidinium bromide (INCRUSE ELLIPTA) 62.5 MCG/INH AEPB Inhale 1 puff into the lungs daily. 7 each 5   venlafaxine XR (EFFEXOR-XR) 75 MG 24 hr capsule TAKE 3 CAPSULES (225 MG TOTAL) BY MOUTH DAILY WITH BREAKFAST. 270 capsule 1   No current facility-administered medications for this visit.    Allergies  Allergen Reactions   Influenza Virus Vacc Split Pf Swelling and Other (See Comments)    Swelling around injection site   Gluten Meal Diarrhea   Lipitor [Atorvastatin] Other (See Comments)    Hepatis      Past Medical History:  Diagnosis Date   ALLERGIC RHINITIS 04/10/2008   Asthma    asthmatic bronchitis   Carpal tunnel syndrome of right wrist    DEPRESSION 08/13/2008   Diabetes mellitus without complication ([facility]   Hepatitis    fatty liver   HYPERLIPIDEMIA 08/04/2006  HYPERTENSION 08/04/2006   HYPOTHYROIDISM 08/04/2006   Kidney injury    LOW BACK PAIN 08/04/2006   Neuromuscular disorder ([place])    PERIMENOPAUSAL SYNDROME 05/08/2008   PVD (peripheral vascular disease) ([place])    occlusive, status post bifem bypass 2007    Past Surgical History:  Procedure Laterality Date   CARPAL TUNNEL RELEASE Right 04/05/2016   Procedure: OPEN RIGHT CARPAL TUNNEL RELEASE;  Surgeon: Jessy Oto, MD;  Location: [place];  Service: Orthopedics;  Laterality: Right;   CHOLECYSTECTOMY     FEMORAL BYPASS     bifem.   HAGLAND'S  DEFORMITY EXCISION     PLANTAR FASCIA RELEASE Right 04/18/2018   Procedure: PLANTAR FASCIA RELEASE AND GASTROCNEMIUS RECESSION RIGHT;  Surgeon: Newt Minion, MD;  Location: [place];  Service: Orthopedics;  Laterality: Right;   TUBAL LIGATION      Social History   Socioeconomic History   Marital status: Married    Spouse name: Not on file   Number of children: 3   Years of education: Not on file   Highest education level: Not on file  Occupational History   Not on file  Tobacco Use   Smoking status: Former Smoker    Quit date: 02/15/2004    Years since quitting: 15.6   Smokeless tobacco: Never Used  Scientific laboratory technician Use: Never used  Substance and Sexual Activity   Alcohol use: Yes    Comment: occasional   Drug use: No   Sexual activity: Yes    Birth control/protection: Post-menopausal  Other Topics Concern   Not on file  Social History Narrative   Not on file   Social Determinants of Health   Financial Resource Strain:    Difficulty of Paying Living Expenses:   Food Insecurity:    Worried About Charity fundraiser in the Last Year:    Arboriculturist in the Last Year:   Transportation Needs:    Film/video editor (Medical):    Lack of Transportation (Non-Medical):   Physical Activity:    Days of Exercise per Week:    Minutes of Exercise per Session:   Stress:    Feeling of Stress :   Social Connections:    Frequency of Communication with Friends and Family:    Frequency of Social Gatherings with Friends and Family:    Attends Religious Services:    Active Member of Clubs or Organizations:    Attends Music therapist:    Marital Status:   Intimate Partner Violence:    Fear of Current or Ex-Partner:    Emotionally Abused:    Physically Abused:    Sexually Abused:     Family History  Problem Relation Age of Onset   Hypertension Mother    Heart disease Father    Hyperlipidemia Father    Hypertension  Father    Alcohol abuse Brother    Diabetes Maternal Grandmother    Alcohol abuse Maternal Uncle    Stroke Maternal Uncle     ROS: Knee pain but no fevers or chills, productive cough, hemoptysis, dysphasia, odynophagia, melena, hematochezia, dysuria, hematuria, rash, seizure activity, orthopnea, PND, pedal edema, claudication. Remaining systems are negative.  Physical Exam:   Blood pressure 134/86, pulse 67, height 5' 4"  (1.626 m), weight 209 lb 6.4 oz (95 kg).  General:  Well developed/well nourished in NAD Skin warm/dry Patient not depressed No peripheral clubbing Back-normal HEENT-normal/normal eyelids Neck supple/normal carotid upstroke bilaterally; right  carotid bruit; no JVD; no thyromegaly chest - CTA/ normal expansion CV - RRR/normal S1 and S2; no murmurs, rubs or gallops;  PMI nondisplaced Abdomen -NT/ND, no HSM, no mass, + bowel sounds, no bruit 2+ femoral pulses, no bruits Ext-no edema, chords; diminished distal pulses Neuro-grossly nonfocal  ECG - Sinus with no ST changes; personally reviewed  A/P  1 coronary calcification-patient with multiple risk factors including diabetes mellitus.  Will treat with aspirin and statin.  Schedule Lexiscan nuclear study for risk stratification.  2 dyspnea-likely pulmonary related.  However also complains of occasional chest tightness.  We will arrange a [place] nuclear study to screen for ischemia.  3 hyperlipidemia-continue statin but will increase to 40 mg daily given documented coronary disease and peripheral vascular disease.  Check lipids and liver in 12 weeks.  4 hypertension-patient's blood pressure is controlled.  Continue present medications and follow.  5 peripheral vascular disease-continue statin.  Add aspirin 81 mg daily.  6 right carotid bruit-scheduled carotid Dopplers.  Kirk Ruths, MD

## 2019-09-18 ENCOUNTER — Ambulatory Visit (INDEPENDENT_AMBULATORY_CARE_PROVIDER_SITE_OTHER): Payer: 59 | Admitting: Cardiology

## 2019-09-18 ENCOUNTER — Encounter: Payer: Self-pay | Admitting: Cardiology

## 2019-09-18 ENCOUNTER — Other Ambulatory Visit: Payer: Self-pay

## 2019-09-18 ENCOUNTER — Encounter: Payer: Self-pay | Admitting: *Deleted

## 2019-09-18 VITALS — BP 134/86 | HR 67 | Ht 64.0 in | Wt 209.4 lb

## 2019-09-18 DIAGNOSIS — I2510 Atherosclerotic heart disease of native coronary artery without angina pectoris: Secondary | ICD-10-CM

## 2019-09-18 DIAGNOSIS — E785 Hyperlipidemia, unspecified: Secondary | ICD-10-CM | POA: Diagnosis not present

## 2019-09-18 DIAGNOSIS — R072 Precordial pain: Secondary | ICD-10-CM | POA: Diagnosis not present

## 2019-09-18 DIAGNOSIS — E7800 Pure hypercholesterolemia, unspecified: Secondary | ICD-10-CM

## 2019-09-18 DIAGNOSIS — R0989 Other specified symptoms and signs involving the circulatory and respiratory systems: Secondary | ICD-10-CM | POA: Diagnosis not present

## 2019-09-18 DIAGNOSIS — I10 Essential (primary) hypertension: Secondary | ICD-10-CM

## 2019-09-18 MED ORDER — ASPIRIN EC 81 MG PO TBEC: 81.0000 mg | DELAYED_RELEASE_TABLET | Freq: Every day | ORAL | 3 refills | Status: DC

## 2019-09-18 MED ORDER — ROSUVASTATIN CALCIUM 40 MG PO TABS: 40.0000 mg | ORAL_TABLET | Freq: Every day | ORAL | 3 refills | Status: DC

## 2019-09-18 NOTE — Patient Instructions (Signed)
Medication Instructions:   START ASPIRIN 81 MG ONCE DAILY  INCREASE ROSUVASTATIN TO 40 MG ONCE DAILY= 2 OF THE 20 MG TABLETS ONCE DAILY  *If you need a refill on your cardiac medications before your next appointment, please call your pharmacy*   Lab Work:  Your physician recommends that you return for lab work in: [place] = FASTING  If you have labs (blood work) drawn today and your tests are completely normal, you will receive your results only by: Marland Kitchen MyChart Message (if you have MyChart) OR . A paper copy in the mail If you have any lab test that is abnormal or we need to change your treatment, we will call you to review the results.   Testing/Procedures:  Your physician has requested that you have a carotid duplex. This test is an ultrasound of the carotid arteries in your neck. It looks at blood flow through these arteries that supply the brain with blood. Allow one hour for this exam. There are no restrictions or special instructions.[place]  Your physician has requested that you have a lexiscan myoview. For further information please visit [email or web address]. Please follow instruction sheet, as given.[place]: At [facility], you and your health needs are our priority.  As part of our continuing mission to provide you with exceptional heart care, we have created designated Provider Care Teams.  These Care Teams include your primary Cardiologist (physician) and Advanced Practice Providers (APPs -  Physician Assistants and Nurse Practitioners) who all work together to provide you with the care you need, when you need it.  We recommend signing up for the patient portal called "MyChart".  Sign up information is provided on this After Visit Summary.  MyChart is used to connect with patients for Virtual Visits (Telemedicine).  Patients are able to view lab/test results, encounter notes, upcoming appointments, etc.  Non-urgent  messages can be sent to your provider as well.   To learn more about what you can do with MyChart, go to [email or web address].    Your next appointment:   6 month(s)  The format for your next appointment:   In Person  Provider:   Kirk Ruths, MD

## 2019-09-19 ENCOUNTER — Other Ambulatory Visit: Payer: 59

## 2019-09-19 ENCOUNTER — Ambulatory Visit: Payer: 59

## 2019-09-25 ENCOUNTER — Telehealth (HOSPITAL_COMMUNITY): Payer: Self-pay | Admitting: *Deleted

## 2019-09-25 ENCOUNTER — Other Ambulatory Visit: Payer: Self-pay

## 2019-09-25 ENCOUNTER — Ambulatory Visit
Admission: RE | Admit: 2019-09-25 | Discharge: 2019-09-25 | Disposition: A | Discharge status: Home / Self Care | Payer: 59 | Source: Ambulatory Visit | Attending: Obstetrics & Gynecology | Admitting: Obstetrics & Gynecology

## 2019-09-25 DIAGNOSIS — N950 Postmenopausal bleeding: Secondary | ICD-10-CM | POA: Diagnosis not present

## 2019-09-25 NOTE — Telephone Encounter (Signed)
Patient given detailed instructions per Myocardial Perfusion Study Information Sheet for the test on 10/01/19. Patient notified to arrive 15 minutes early and that it is imperative to arrive on time for appointment to keep from having the test rescheduled.  If you need to cancel or reschedule your appointment, please call the office within 24 hours of your appointment. . Patient verbalized understanding. Kirstie Peri

## 2019-09-26 ENCOUNTER — Ambulatory Visit: Payer: 59

## 2019-10-01 ENCOUNTER — Other Ambulatory Visit (HOSPITAL_COMMUNITY): Payer: Self-pay | Admitting: Cardiology

## 2019-10-01 ENCOUNTER — Ambulatory Visit (HOSPITAL_COMMUNITY)
Admission: RE | Admit: 2019-10-01 | Discharge: 2019-10-01 | Disposition: A | Discharge status: Home / Self Care | Payer: 59 | Source: Ambulatory Visit | Attending: Cardiovascular Disease | Admitting: Cardiovascular Disease

## 2019-10-01 ENCOUNTER — Ambulatory Visit (HOSPITAL_BASED_OUTPATIENT_CLINIC_OR_DEPARTMENT_OTHER): Payer: 59

## 2019-10-01 ENCOUNTER — Other Ambulatory Visit: Payer: Self-pay

## 2019-10-01 DIAGNOSIS — R0989 Other specified symptoms and signs involving the circulatory and respiratory systems: Secondary | ICD-10-CM | POA: Diagnosis present

## 2019-10-01 DIAGNOSIS — R072 Precordial pain: Secondary | ICD-10-CM

## 2019-10-01 DIAGNOSIS — I6521 Occlusion and stenosis of right carotid artery: Secondary | ICD-10-CM

## 2019-10-01 LAB — MYOCARDIAL PERFUSION IMAGING
LV dias vol: 44 mL (ref 46–106)
LV sys vol: 13 mL
Peak HR: 82 {beats}/min
Rest HR: 64 {beats}/min
SDS: 0
SRS: 0
SSS: 0
TID: 0.94

## 2019-10-01 MED ORDER — REGADENOSON 0.4 MG/5ML IV SOLN
0.4000 mg | Freq: Once | INTRAVENOUS | Status: AC
  Administered 2019-10-01: 0.4 mg via INTRAVENOUS

## 2019-10-01 MED ORDER — TECHNETIUM TC 99M TETROFOSMIN IV KIT
32.3000 | PACK | Freq: Once | INTRAVENOUS | Status: AC | PRN
  Administered 2019-10-01: 32.3 via INTRAVENOUS
  Filled 2019-10-01: qty 33

## 2019-10-01 MED ORDER — TECHNETIUM TC 99M TETROFOSMIN IV KIT
10.4000 | PACK | Freq: Once | INTRAVENOUS | Status: AC | PRN
  Administered 2019-10-01: 10.4 via INTRAVENOUS
  Filled 2019-10-01: qty 11

## 2019-10-02 ENCOUNTER — Telehealth (INDEPENDENT_AMBULATORY_CARE_PROVIDER_SITE_OTHER): Payer: 59 | Admitting: Obstetrics & Gynecology

## 2019-10-02 ENCOUNTER — Encounter: Payer: Self-pay | Admitting: Obstetrics & Gynecology

## 2019-10-02 DIAGNOSIS — R102 Pelvic and perineal pain: Secondary | ICD-10-CM

## 2019-10-02 DIAGNOSIS — N950 Postmenopausal bleeding: Secondary | ICD-10-CM
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
## 2019-10-03 ENCOUNTER — Ambulatory Visit: Payer: 59 | Attending: Obstetrics & Gynecology | Admitting: Physical Therapy

## 2019-10-07 ENCOUNTER — Ambulatory Visit: Payer: 59 | Admitting: Physical Therapy

## 2019-10-10 ENCOUNTER — Other Ambulatory Visit: Payer: Self-pay

## 2019-10-10 ENCOUNTER — Ambulatory Visit (INDEPENDENT_AMBULATORY_CARE_PROVIDER_SITE_OTHER): Payer: 59

## 2019-10-10 DIAGNOSIS — Z0000 Encounter for general adult medical examination without abnormal findings: Secondary | ICD-10-CM | POA: Diagnosis not present

## 2019-10-14 ENCOUNTER — Encounter: Payer: Self-pay | Admitting: Nurse Practitioner

## 2019-10-14 ENCOUNTER — Encounter: Payer: Self-pay | Admitting: Obstetrics & Gynecology

## 2019-10-14 DIAGNOSIS — R928 Other abnormal and inconclusive findings on diagnostic imaging of breast: Secondary | ICD-10-CM | POA: Insufficient documentation

## 2019-10-14 HISTORY — DX: Other abnormal and inconclusive findings on diagnostic imaging of breast: R92.8

## 2019-10-15 ENCOUNTER — Other Ambulatory Visit: Payer: Self-pay | Admitting: Obstetrics & Gynecology

## 2019-10-15 DIAGNOSIS — R928 Other abnormal and inconclusive findings on diagnostic imaging of breast: Secondary | ICD-10-CM

## 2019-10-16 ENCOUNTER — Encounter: Payer: 59 | Admitting: Physical Therapy

## 2019-10-17 NOTE — Telephone Encounter (Signed)
Erroneous encounter

## 2019-10-24 ENCOUNTER — Ambulatory Visit (INDEPENDENT_AMBULATORY_CARE_PROVIDER_SITE_OTHER): Payer: 59 | Admitting: Obstetrics and Gynecology

## 2019-10-24 ENCOUNTER — Encounter: Payer: Self-pay | Admitting: Obstetrics and Gynecology

## 2019-10-24 ENCOUNTER — Other Ambulatory Visit: Payer: Self-pay

## 2019-10-24 VITALS — BP 117/73 | HR 73 | Resp 16 | Ht 64.0 in | Wt 207.0 lb

## 2019-10-24 DIAGNOSIS — N950 Postmenopausal bleeding: Secondary | ICD-10-CM | POA: Diagnosis not present

## 2019-10-24 DIAGNOSIS — N840 Polyp of corpus uteri: Secondary | ICD-10-CM | POA: Diagnosis not present

## 2019-10-24 NOTE — Progress Notes (Signed)
GYNECOLOGY OFFICE FOLLOW UP NOTE  History:  59 y.o. [id number] here today for follow up for post menopausal bleeding, with EMB positive for polyp. She is doing well with no further complaints.  Under a significant amount of stress, daughter passed away from clear cell ovarian cancer in 03/2019. Husband had a stroke a month later. She has had multiple health issues, including a lump in right breast found by mammogram for which she is undergoing an ultrasound next week.    Past Medical History:  Diagnosis Date  . ALLERGIC RHINITIS 04/10/2008  . Asthma    asthmatic bronchitis  . Breast lump in female   . Carpal tunnel syndrome of right wrist   . DEPRESSION 08/13/2008  . Diabetes mellitus without complication ([place])   . Hepatitis    fatty liver  . HYPERLIPIDEMIA 08/04/2006  . HYPERTENSION 08/04/2006  . HYPOTHYROIDISM 08/04/2006  . Kidney injury   . LOW BACK PAIN 08/04/2006  . Neuromuscular disorder ([place])   . PERIMENOPAUSAL SYNDROME 05/08/2008  . PVD (peripheral vascular disease) ([place])    occlusive, status post bifem bypass 2007    Past Surgical History:  Procedure Laterality Date  . CARPAL TUNNEL RELEASE Right 04/05/2016   Procedure: OPEN RIGHT CARPAL TUNNEL RELEASE;  Surgeon: Jessy Oto, MD;  Location: [facility];  Service: Orthopedics;  Laterality: Right;  . CHOLECYSTECTOMY    . FEMORAL BYPASS     bifem.  Marland Kitchen HAGLAND'S DEFORMITY EXCISION    . PLANTAR FASCIA RELEASE Right 04/18/2018   Procedure: PLANTAR FASCIA RELEASE AND GASTROCNEMIUS RECESSION RIGHT;  Surgeon: Newt Minion, MD;  Location: [facility];  Service: Orthopedics;  Laterality: Right;  . TUBAL LIGATION       Current Outpatient Medications:  .  albuterol (PROVENTIL) (2.5 MG/3ML) 0.083% nebulizer solution, Take 3 mLs (2.5 mg total) by nebulization every 6 (six) hours as needed for wheezing or shortness of breath., Disp: 150 mL, Rfl: 1 .  albuterol (VENTOLIN HFA) 108 (90 Base) MCG/ACT inhaler, Inhale 1-2 puffs into  the lungs every 4 (four) hours as needed for wheezing or shortness of breath., Disp: 8 g, Rfl: 2 .  ALPRAZolam (XANAX) 0.25 MG tablet, Take 1 tablet (0.25 mg total) by mouth 2 (two) times daily as needed for anxiety., Disp: 30 tablet, Rfl: 0 .  aspirin EC 81 MG tablet, Take 1 tablet (81 mg total) by mouth daily. Swallow whole., Disp: 90 tablet, Rfl: 3 .  azelastine (ASTELIN) 0.1 % nasal spray, Place 1 spray into both nostrils daily as needed for rhinitis., Disp: 30 mL, Rfl: 5 .  B Complex Vitamins (VITAMIN B COMPLEX PO), Take 1 tablet by mouth daily., Disp: , Rfl:  .  benazepril (LOTENSIN) 10 MG tablet, Take 1 tablet (10 mg total) by mouth daily., Disp: 30 tablet, Rfl: 5 .  diclofenac Sodium (VOLTAREN) 1 % GEL, Apply topically., Disp: , Rfl:  .  famotidine (PEPCID) 40 MG tablet, Take 40 mg by mouth at bedtime., Disp: , Rfl:  .  fluticasone furoate-vilanterol (BREO ELLIPTA) 100-25 MCG/INH AEPB, Inhale 1 puff into the lungs daily., Disp: 28 each, Rfl: 5 .  ibuprofen (ADVIL,MOTRIN) 200 MG tablet, Take 400-600 mg by mouth every 6 (six) hours as needed for headache or moderate pain., Disp: , Rfl:  .  levothyroxine (SYNTHROID) 88 MCG tablet, Take 1 tablet (88 mcg total) by mouth daily., Disp: 90 tablet, Rfl: 3 .  Menthol, Topical Analgesic, (ICY HOT EX), Apply 1 application topically daily as needed (  pain)., Disp: , Rfl:  .  metFORMIN (GLUCOPHAGE) 500 MG tablet, Take 1 tablet (500 mg total) by mouth 2 (two) times daily with a meal. Take 1 tab (516m) once a day for the first week then increase to 1 tab (502m twice a day with meals., Disp: 180 tablet, Rfl: 3 .  metoprolol tartrate (LOPRESSOR) 50 MG tablet, Take 1 tablet (50 mg total) by mouth 2 (two) times daily., Disp: 60 tablet, Rfl: 5 .  rosuvastatin (CRESTOR) 40 MG tablet, Take 1 tablet (40 mg total) by mouth daily., Disp: 90 tablet, Rfl: 3 .  umeclidinium bromide (INCRUSE ELLIPTA) 62.5 MCG/INH AEPB, Inhale 1 puff into the lungs daily., Disp: 7 each,  Rfl: 5 .  venlafaxine XR (EFFEXOR-XR) 75 MG 24 hr capsule, TAKE 3 CAPSULES (225 MG TOTAL) BY MOUTH DAILY WITH BREAKFAST., Disp: 270 capsule, Rfl: 1  The following portions of the patient's history were reviewed and updated as appropriate: allergies, current medications, past family history, past medical history, past social history, past surgical history and problem list.   Review of Systems:  Pertinent items noted in HPI and remainder of comprehensive ROS otherwise negative.   Objective:  Physical Exam BP 117/73   Pulse 73   Resp 16   Ht 5' 4"  (1.626 m)   Wt 207 lb (93.9 kg)   BMI 35.53 kg/m  CONSTITUTIONAL: Well-developed, well-nourished female in no acute distress.  HENT:  Normocephalic, atraumatic. External right and left ear normal. Oropharynx is clear and moist EYES: Conjunctivae and EOM are normal. Pupils are equal, round, and reactive to light. No scleral icterus.  NECK: Normal range of motion, supple, no masses SKIN: Skin is warm and dry. No rash noted. Not diaphoretic. No erythema. No pallor. NEUROLOGIC: Alert and oriented to person, place, and time. Normal reflexes, muscle tone coordination. No cranial nerve deficit noted. PSYCHIATRIC: Normal mood and affect. Normal behavior. Normal judgment and thought content. CARDIOVASCULAR: Normal heart rate noted RESPIRATORY: Effort normal, no problems with respiration noted ABDOMEN: Soft, no distention noted.   PELVIC: deferred MUSCULOSKELETAL: Normal range of motion. No edema noted.   Labs and Imaging  Assessment & Plan:   1. Post-menopausal bleeding  2. Endometrial polyp - EMB consistent with polyp, benign path - recommend removal via D&C hysteroscopy, polypectomy - reviewed risks/benefits, infection, hemorrhage, damage to surrounding tissue and organs, risks of continued bleeding, transformation of poyp, she is agreeable to proceed - needs to look at work schedule, will let office know when she would like to be  scheduled - will need pre-op visit - Reviewed pre-op instructions, expected post-op course. She understands she will need to be NPO after midnight the night prior to the procedure. She understands she will have a PAT visit. She understands she will be called by the administrative scheduler to schedule date/time for the above. Answered all questions, she will call with any issues.    Routine preventative health maintenance measures emphasized. Please refer to After Visit Summary for other counseling recommendations.   Return for no follow up needed at this time.  Total face-to-face time with patient: 22 minutes. Over 50% of encounter was spent on counseling and coordination of care.  K.Feliz BeamM.D. Attending [facility] for [employer]

## 2019-10-29 ENCOUNTER — Ambulatory Visit
Admission: RE | Admit: 2019-10-29 | Discharge: 2019-10-29 | Disposition: A | Discharge status: Home / Self Care | Payer: 59 | Source: Ambulatory Visit | Attending: Obstetrics & Gynecology | Admitting: Obstetrics & Gynecology

## 2019-10-29 ENCOUNTER — Other Ambulatory Visit: Payer: Self-pay

## 2019-10-29 ENCOUNTER — Encounter (HOSPITAL_BASED_OUTPATIENT_CLINIC_OR_DEPARTMENT_OTHER): Payer: 59 | Admitting: Internal Medicine

## 2019-10-29 DIAGNOSIS — R928 Other abnormal and inconclusive findings on diagnostic imaging of breast: Secondary | ICD-10-CM

## 2019-10-31 ENCOUNTER — Encounter: Payer: Self-pay | Admitting: Nurse Practitioner

## 2019-10-31 ENCOUNTER — Ambulatory Visit (INDEPENDENT_AMBULATORY_CARE_PROVIDER_SITE_OTHER): Payer: 59 | Admitting: Nurse Practitioner

## 2019-10-31 ENCOUNTER — Encounter: Payer: 59 | Admitting: Physical Therapy

## 2019-10-31 VITALS — BP 150/85 | HR 118 | Ht 64.0 in | Wt 208.0 lb

## 2019-10-31 DIAGNOSIS — B3731 Acute candidiasis of vulva and vagina: Secondary | ICD-10-CM | POA: Insufficient documentation

## 2019-10-31 DIAGNOSIS — F5101 Primary insomnia: Secondary | ICD-10-CM

## 2019-10-31 DIAGNOSIS — L03818 Cellulitis of other sites: Secondary | ICD-10-CM

## 2019-10-31 DIAGNOSIS — G4700 Insomnia, unspecified: Secondary | ICD-10-CM | POA: Insufficient documentation

## 2019-10-31 DIAGNOSIS — L0390 Cellulitis, unspecified: Secondary | ICD-10-CM | POA: Insufficient documentation

## 2019-10-31 DIAGNOSIS — B373 Candidiasis of vulva and vagina: Secondary | ICD-10-CM | POA: Diagnosis not present

## 2019-10-31 HISTORY — DX: Candidiasis of vulva and vagina: B37.3

## 2019-10-31 HISTORY — DX: Cellulitis, unspecified: L03.90

## 2019-10-31 HISTORY — DX: Primary insomnia: F51.01

## 2019-10-31 HISTORY — DX: Acute candidiasis of vulva and vagina: B37.31

## 2019-10-31 HISTORY — DX: Insomnia, unspecified: G47.00

## 2019-10-31 MED ORDER — FLUCONAZOLE 150 MG PO TABS: 150.0000 mg | ORAL_TABLET | Freq: Once | ORAL | 0 refills | Status: AC

## 2019-10-31 MED ORDER — MUPIROCIN 2 % EX OINT: TOPICAL_OINTMENT | CUTANEOUS | 3 refills | Status: DC

## 2019-10-31 MED ORDER — DOXYCYCLINE HYCLATE 100 MG PO TABS: 100.0000 mg | ORAL_TABLET | Freq: Two times a day (BID) | ORAL | 0 refills | Status: DC

## 2019-10-31 MED ORDER — HYDROXYZINE HCL 50 MG PO TABS: 50.0000 mg | ORAL_TABLET | Freq: Every evening | ORAL | 3 refills | Status: DC | PRN

## 2019-10-31 NOTE — Assessment & Plan Note (Signed)
Insomnia and difficulty sleeping throughout the night noted with [name] morning awakening.  It seems that she has gotten her body onto a cycle of going to bed earlier and then waking up in the middle of the night. Recommend trial of hydroxyzine to see if this helps her obtain a more restful night sleep.  Discussed with the patient that this medication typically does not cause a significant amount of drowsiness or dryness as Benadryl would. Recommend starting with one tab at bedtime and may increase to two tabs if she does not fall asleep within 30 minutes.  If one tab appears to be too much she may cut the tab in half and try half a tab. Follow-up if symptoms worsen or fail to improve.

## 2019-10-31 NOTE — Assessment & Plan Note (Signed)
Cellulitis noted to the right inguinal area with no purulent discharge noted. Suspect this is likely from possible ingrown hair and exacerbated by picking or squeezing of the area. Recommend the patient use warm compress to the area several times a day. Recommend use of mupirocin ointment to the area and place a gauze pad over top in between the panties to keep the medication in place. Oral doxycycline prescribed in addition to topical antibiotic ointment. Follow-up if symptoms worsen or fail to improve.

## 2019-10-31 NOTE — Patient Instructions (Addendum)
Apply warm compresses to the area several times a day.  Do not pick or squeeze the area.    Cellulitis, Adult  Cellulitis is a skin infection. The infected area is usually warm, red, swollen, and tender. This condition occurs most often in the arms and lower legs. The infection can travel to the muscles, blood, and underlying tissue and become serious. It is very important to get treated for this condition. What are the causes? Cellulitis is caused by bacteria. The bacteria enter through a break in the skin, such as a cut, burn, insect bite, open sore, or crack. What increases the risk? This condition is more likely to occur in people who:  Have a weak body defense system (immune system).  Have open wounds on the skin, such as cuts, burns, bites, and scrapes. Bacteria can enter the body through these open wounds.  Are older than 59 years of age.  Have diabetes.  Have a type of long-lasting (chronic) liver disease (cirrhosis) or kidney disease.  Are obese.  Have a skin condition such as: ? Itchy rash (eczema). ? Slow movement of blood in the veins (venous stasis). ? Fluid buildup below the skin (edema).  Have had radiation therapy.  Use IV drugs. What are the signs or symptoms? Symptoms of this condition include:  Redness, streaking, or spotting on the skin.  Swollen area of the skin.  Tenderness or pain when an area of the skin is touched.  Warm skin.  A fever.  Chills.  Blisters. How is this diagnosed? This condition is diagnosed based on a medical history and physical exam. You may also have tests, including:  Blood tests.  Imaging tests. How is this treated? Treatment for this condition may include:  Medicines, such as antibiotic medicines or medicines to treat allergies (antihistamines).  Supportive care, such as rest and application of cold or warm cloths (compresses) to the skin.  Hospital care, if the condition is severe. The infection usually  starts to get better within 1-2 days of treatment. Follow these instructions at home:  Medicines  Take over-the-counter and prescription medicines only as told by your health care provider.  If you were prescribed an antibiotic medicine, take it as told by your health care provider. Do not stop taking the antibiotic even if you start to feel better. General instructions  Drink enough fluid to keep your urine pale yellow.  Do not touch or rub the infected area.  Raise (elevate) the infected area above the level of your heart while you are sitting or lying down.  Apply warm or cold compresses to the affected area as told by your health care provider.  Keep all follow-up visits as told by your health care provider. This is important. These visits let your health care provider make sure a more serious infection is not developing. Contact a health care provider if:  You have a fever.  Your symptoms do not begin to improve within 1-2 days of starting treatment.  Your bone or joint underneath the infected area becomes painful after the skin has healed.  Your infection returns in the same area or another area.  You notice a swollen bump in the infected area.  You develop new symptoms.  You have a general ill feeling (malaise) with muscle aches and pains. Get help right away if:  Your symptoms get worse.  You feel very sleepy.  You develop vomiting or diarrhea that persists.  You notice red streaks coming from the infected area.  Your red area gets larger or turns dark in color. These symptoms may represent a serious problem that is an emergency. Do not wait to see if the symptoms will go away. Get medical help right away. Call your local emergency services (911 in the [place] Do not drive yourself to the hospital. Summary  Cellulitis is a skin infection. This condition occurs most often in the arms and lower legs.  Treatment for this condition may include medicines, such as  antibiotic medicines or antihistamines.  Take over-the-counter and prescription medicines only as told by your health care provider. If you were prescribed an antibiotic medicine, do not stop taking the antibiotic even if you start to feel better.  Contact a health care provider if your symptoms do not begin to improve within 1-2 days of starting treatment or your symptoms get worse.  Keep all follow-up visits as told by your health care provider. This is important. These visits let your health care provider make sure that a more serious infection is not developing. This information is not intended to replace advice given to you by your health care provider. Make sure you discuss any questions you have with your health care provider. Document Revised: 06/22/2017 Document Reviewed: 06/22/2017 Elsevier Patient Education  [place].

## 2019-10-31 NOTE — Progress Notes (Signed)
Acute Office Visit  Subjective:    Patient ID: [name], female    DOB: 08-06-1960, 59 y.o.   MRN: [id number]  CC: Rash  HPI Patient is in today for redness and irritation to the right inguinal region.  Patient reports that she occasionally experiences redness and irritation in this area with a small cyst or ingrown hair.  She noted this started earlier this week and reports that she tried to squeeze the area to see if there was anything in it.  She states that the area has now grown even more red and irritated and has a cellulitis appearance to it.  She has been using triple antibiotic ointment to the area and does feel that it looks a little better today than it has in the past.  She does endorse some pain to the area most specifically where her panty line rubs against it.  She has been using a small piece of gauze in between the elastic of her panties and the skin to help prevent friction and irritation.  She denies fever, chills, nausea, vomiting, diarrhea.  Patient also reports that she is having difficulty sleeping all the way through the night on most nights.  She reports that she is extremely tired and lays down in the evening around 8 PM but typically wakes up around 3 AM and is unable to fall back asleep.  She states that she has tried melatonin however this has not worked very well for her.  She reports that Benadryl makes her extremely groggy the next day.  She does have some Xanax left from previous prescription and she tried half of one but feels like this makes her too sleepy as well.  Past Medical History:  Diagnosis Date  . ALLERGIC RHINITIS 04/10/2008  . Asthma    asthmatic bronchitis  . Breast lump in female   . Carpal tunnel syndrome of right wrist   . DEPRESSION 08/13/2008  . Diabetes mellitus without complication ([place])   . Hepatitis    fatty liver  . HYPERLIPIDEMIA 08/04/2006  . HYPERTENSION 08/04/2006  . HYPOTHYROIDISM 08/04/2006  . Kidney injury   . LOW BACK  PAIN 08/04/2006  . Neuromuscular disorder ([place])   . PERIMENOPAUSAL SYNDROME 05/08/2008  . PVD (peripheral vascular disease) ([place])    occlusive, status post bifem bypass 2007    Past Surgical History:  Procedure Laterality Date  . CARPAL TUNNEL RELEASE Right 04/05/2016   Procedure: OPEN RIGHT CARPAL TUNNEL RELEASE;  Surgeon: Jessy Oto, MD;  Location: [facility];  Service: Orthopedics;  Laterality: Right;  . CHOLECYSTECTOMY    . FEMORAL BYPASS     bifem.  Marland Kitchen HAGLAND'S DEFORMITY EXCISION    . PLANTAR FASCIA RELEASE Right 04/18/2018   Procedure: PLANTAR FASCIA RELEASE AND GASTROCNEMIUS RECESSION RIGHT;  Surgeon: Newt Minion, MD;  Location: [facility];  Service: Orthopedics;  Laterality: Right;  . TUBAL LIGATION      Family History  Problem Relation Age of Onset  . Hypertension Mother   . Heart disease Father   . Hyperlipidemia Father   . Hypertension Father   . Alcohol abuse Brother   . Diabetes Maternal Grandmother   . Alcohol abuse Maternal Uncle   . Stroke Maternal Uncle   . Ovarian cancer Daughter     Social History   Socioeconomic History  . Marital status: Married    Spouse name: Not on file  . Number of children: 3  . Years of education:  Not on file  . Highest education level: Not on file  Occupational History  . Not on file  Tobacco Use  . Smoking status: Former Smoker    Quit date: 02/15/2004    Years since quitting: 15.7  . Smokeless tobacco: Never Used  Vaping Use  . Vaping Use: Never used  Substance and Sexual Activity  . Alcohol use: Yes    Comment: occasional  . Drug use: No  . Sexual activity: Yes    Birth control/protection: Post-menopausal  Other Topics Concern  . Not on file  Social History Narrative  . Not on file   Social Determinants of Health   Financial Resource Strain:   . Difficulty of Paying Living Expenses: Not on file  Food Insecurity:   . Worried About Charity fundraiser in the Last Year: Not on file  . Ran Out of  Food in the Last Year: Not on file  Transportation Needs:   . Lack of Transportation (Medical): Not on file  . Lack of Transportation (Non-Medical): Not on file  Physical Activity:   . Days of Exercise per Week: Not on file  . Minutes of Exercise per Session: Not on file  Stress:   . Feeling of Stress : Not on file  Social Connections:   . Frequency of Communication with Friends and Family: Not on file  . Frequency of Social Gatherings with Friends and Family: Not on file  . Attends Religious Services: Not on file  . Active Member of Clubs or Organizations: Not on file  . Attends Archivist Meetings: Not on file  . Marital Status: Not on file  Intimate Partner Violence:   . Fear of Current or Ex-Partner: Not on file  . Emotionally Abused: Not on file  . Physically Abused: Not on file  . Sexually Abused: Not on file    Outpatient Medications Prior to Visit  Medication Sig Dispense Refill  . albuterol (PROVENTIL) (2.5 MG/3ML) 0.083% nebulizer solution Take 3 mLs (2.5 mg total) by nebulization every 6 (six) hours as needed for wheezing or shortness of breath. 150 mL 1  . albuterol (VENTOLIN HFA) 108 (90 Base) MCG/ACT inhaler Inhale 1-2 puffs into the lungs every 4 (four) hours as needed for wheezing or shortness of breath. 8 g 2  . ALPRAZolam (XANAX) 0.25 MG tablet Take 1 tablet (0.25 mg total) by mouth 2 (two) times daily as needed for anxiety. 30 tablet 0  . aspirin EC 81 MG tablet Take 1 tablet (81 mg total) by mouth daily. Swallow whole. 90 tablet 3  . azelastine (ASTELIN) 0.1 % nasal spray Place 1 spray into both nostrils daily as needed for rhinitis. 30 mL 5  . B Complex Vitamins (VITAMIN B COMPLEX PO) Take 1 tablet by mouth daily.    . benazepril (LOTENSIN) 10 MG tablet Take 1 tablet (10 mg total) by mouth daily. 30 tablet 5  . diclofenac Sodium (VOLTAREN) 1 % GEL Apply topically.    . famotidine (PEPCID) 40 MG tablet Take 40 mg by mouth at bedtime.    . fluticasone  furoate-vilanterol (BREO ELLIPTA) 100-25 MCG/INH AEPB Inhale 1 puff into the lungs daily. 28 each 5  . ibuprofen (ADVIL,MOTRIN) 200 MG tablet Take 400-600 mg by mouth every 6 (six) hours as needed for headache or moderate pain.    Marland Kitchen levothyroxine (SYNTHROID) 88 MCG tablet Take 1 tablet (88 mcg total) by mouth daily. 90 tablet 3  . Menthol, Topical Analgesic, (ICY HOT EX)  Apply 1 application topically daily as needed (pain).    . metFORMIN (GLUCOPHAGE) 500 MG tablet Take 1 tablet (500 mg total) by mouth 2 (two) times daily with a meal. Take 1 tab (528m) once a day for the first week then increase to 1 tab (5062m twice a day with meals. 180 tablet 3  . metoprolol tartrate (LOPRESSOR) 50 MG tablet Take 1 tablet (50 mg total) by mouth 2 (two) times daily. 60 tablet 5  . rosuvastatin (CRESTOR) 40 MG tablet Take 1 tablet (40 mg total) by mouth daily. 90 tablet 3  . umeclidinium bromide (INCRUSE ELLIPTA) 62.5 MCG/INH AEPB Inhale 1 puff into the lungs daily. 7 each 5  . venlafaxine XR (EFFEXOR-XR) 75 MG 24 hr capsule TAKE 3 CAPSULES (225 MG TOTAL) BY MOUTH DAILY WITH BREAKFAST. 270 capsule 1   No facility-administered medications prior to visit.    Allergies  Allergen Reactions  . Influenza Virus Vacc Split Pf Swelling and Other (See Comments)    Swelling around injection site  . Gluten Meal Diarrhea  . Lipitor [Atorvastatin] Other (See Comments)    Hepatis     Review of Systems  All review of systems negative except for what is noted in the HPI.    Objective:    Physical Exam Vitals and nursing note reviewed. Exam conducted with a chaperone present.  Constitutional:      Appearance: Normal appearance.  HENT:     Head: Normocephalic.  Eyes:     Extraocular Movements: Extraocular movements intact.     Conjunctiva/sclera: Conjunctivae normal.     Pupils: Pupils are equal, round, and reactive to light.  Cardiovascular:     Rate and Rhythm: Normal rate and regular rhythm.     Pulses:  Normal pulses.     Heart sounds: Normal heart sounds.  Pulmonary:     Effort: Pulmonary effort is normal.     Breath sounds: Normal breath sounds.  Abdominal:     General: Abdomen is flat. There is no distension.     Palpations: Abdomen is soft.     Tenderness: There is no abdominal tenderness.  Genitourinary:   Musculoskeletal:        General: Normal range of motion.     Cervical back: Normal range of motion.     Right lower leg: No edema.     Left lower leg: No edema.  Skin:    General: Skin is warm and dry.     Capillary Refill: Capillary refill takes less than 2 seconds.     Findings: Erythema present.  Neurological:     General: No focal deficit present.     Mental Status: She is alert and oriented to person, place, and time.  Psychiatric:        Mood and Affect: Mood normal.        Behavior: Behavior normal.        Thought Content: Thought content normal.        Judgment: Judgment normal.     BP (!) 150/85   Pulse (!) 118   Ht 5' 4"  (1.626 m)   Wt 208 lb (94.3 kg)   BMI 35.70 kg/m  Wt Readings from Last 3 Encounters:  10/31/19 208 lb (94.3 kg)  10/24/19 207 lb (93.9 kg)  10/01/19 209 lb (94.8 kg)    Health Maintenance Due  Topic Date Due  . OPHTHALMOLOGY EXAM  Never done    There are no preventive care reminders to display for this  patient.   Lab Results  Component Value Date   TSH 1.41 05/17/2019   Lab Results  Component Value Date   WBC 7.3 05/17/2019   HGB 14.2 05/17/2019   HCT 41.5 05/17/2019   MCV 89.1 05/17/2019   PLT 192 05/17/2019   Lab Results  Component Value Date   NA 139 05/17/2019   K 4.3 05/17/2019   CO2 21 05/17/2019   GLUCOSE 118 (H) 05/17/2019   BUN 16 05/17/2019   CREATININE 0.97 05/17/2019   BILITOT 0.4 05/17/2019   ALKPHOS 116 04/18/2018   AST 20 05/17/2019   ALT 25 05/17/2019   PROT 6.7 05/17/2019   ALBUMIN 3.7 04/18/2018   CALCIUM 9.3 05/17/2019   ANIONGAP 11 04/18/2018   GFR 74.37 03/25/2013   Lab Results    Component Value Date   CHOL 193 05/17/2019   Lab Results  Component Value Date   HDL 41 (L) 05/17/2019   Lab Results  Component Value Date   LDLCALC 130 (H) 05/17/2019   Lab Results  Component Value Date   TRIG 117 05/17/2019   Lab Results  Component Value Date   CHOLHDL 4.7 05/17/2019   Lab Results  Component Value Date   HGBA1C 6.6 (H) 05/17/2019       Assessment & Plan:   Problem List Items Addressed This Visit      Genitourinary   Vulvar candidiasis    Symptoms and presentation consistent with vulvar candidiasis. We will treat with oral Diflucan one dose with option to repeat the dose in 72 hours if symptoms persist. Suggested to the patient that she may also utilize the mupirocin ointment to the fissured area to help aid in healing and prevent infection. Follow-up if symptoms worsen or fail to improve.      Relevant Medications   mupirocin ointment (BACTROBAN) 2 %   fluconazole (DIFLUCAN) 150 MG tablet     Other   Cellulitis - Primary    Cellulitis noted to the right inguinal area with no purulent discharge noted. Suspect this is likely from possible ingrown hair and exacerbated by picking or squeezing of the area. Recommend the patient use warm compress to the area several times a day. Recommend use of mupirocin ointment to the area and place a gauze pad over top in between the panties to keep the medication in place. Oral doxycycline prescribed in addition to topical antibiotic ointment. Follow-up if symptoms worsen or fail to improve.      Relevant Medications   doxycycline (VIBRA-TABS) 100 MG tablet   mupirocin ointment (BACTROBAN) 2 %   Primary insomnia    Insomnia and difficulty sleeping throughout the night noted with [name] morning awakening.  It seems that she has gotten her body onto a cycle of going to bed earlier and then waking up in the middle of the night. Recommend trial of hydroxyzine to see if this helps her obtain a more restful night  sleep.  Discussed with the patient that this medication typically does not cause a significant amount of drowsiness or dryness as Benadryl would. Recommend starting with one tab at bedtime and may increase to two tabs if she does not fall asleep within 30 minutes.  If one tab appears to be too much she may cut the tab in half and try half a tab. Follow-up if symptoms worsen or fail to improve.      Relevant Medications   hydrOXYzine (ATARAX/VISTARIL) 50 MG tablet       Meds ordered  this encounter  Medications  . doxycycline (VIBRA-TABS) 100 MG tablet    Sig: Take 1 tablet (100 mg total) by mouth 2 (two) times daily.    Dispense:  14 tablet    Refill:  0  . mupirocin ointment (BACTROBAN) 2 %    Sig: Apply to affected area TID for 7 days.    Dispense:  30 g    Refill:  3  . fluconazole (DIFLUCAN) 150 MG tablet    Sig: Take 1 tablet (150 mg total) by mouth once for 1 dose.    Dispense:  1 tablet    Refill:  0  . hydrOXYzine (ATARAX/VISTARIL) 50 MG tablet    Sig: Take 1 tablet (50 mg total) by mouth at bedtime and may repeat dose one time if needed.    Dispense:  60 tablet    Refill:  3     Orma Render, NP

## 2019-10-31 NOTE — Assessment & Plan Note (Signed)
Symptoms and presentation consistent with vulvar candidiasis. We will treat with oral Diflucan one dose with option to repeat the dose in 72 hours if symptoms persist. Suggested to the patient that she may also utilize the mupirocin ointment to the fissured area to help aid in healing and prevent infection. Follow-up if symptoms worsen or fail to improve.

## 2019-11-11 ENCOUNTER — Ambulatory Visit (INDEPENDENT_AMBULATORY_CARE_PROVIDER_SITE_OTHER): Payer: 59 | Admitting: Nurse Practitioner

## 2019-11-11 ENCOUNTER — Encounter: Payer: Self-pay | Admitting: *Deleted

## 2019-11-11 ENCOUNTER — Encounter: Payer: Self-pay | Admitting: Nurse Practitioner

## 2019-11-11 ENCOUNTER — Other Ambulatory Visit: Payer: Self-pay

## 2019-11-11 VITALS — BP 108/70 | HR 61 | Temp 97.9°F | Ht 64.0 in | Wt 207.6 lb

## 2019-11-11 DIAGNOSIS — F321 Major depressive disorder, single episode, moderate: Secondary | ICD-10-CM

## 2019-11-11 DIAGNOSIS — E890 Postprocedural hypothyroidism: Secondary | ICD-10-CM

## 2019-11-11 DIAGNOSIS — E1159 Type 2 diabetes mellitus with other circulatory complications: Secondary | ICD-10-CM | POA: Diagnosis not present

## 2019-11-11 DIAGNOSIS — I10 Essential (primary) hypertension: Secondary | ICD-10-CM

## 2019-11-11 DIAGNOSIS — I739 Peripheral vascular disease, unspecified: Secondary | ICD-10-CM | POA: Diagnosis not present

## 2019-11-11 LAB — POCT GLYCOSYLATED HEMOGLOBIN (HGB A1C): Hemoglobin A1C: 6.8 % — AB (ref 4.0–5.6)

## 2019-11-11 MED ORDER — METFORMIN HCL ER 500 MG PO TB24: 1000.0000 mg | ORAL_TABLET | Freq: Every day | ORAL | 1 refills | Status: DC

## 2019-11-11 MED ORDER — METOPROLOL SUCCINATE ER 100 MG PO TB24: 100.0000 mg | ORAL_TABLET | Freq: Every day | ORAL | 3 refills | Status: DC

## 2019-11-11 NOTE — Patient Instructions (Signed)
Diabetes Basics  Diabetes (diabetes mellitus) is a long-term (chronic) disease. It occurs when the body does not properly use sugar (glucose) that is released from food after you eat. Diabetes may be caused by one or both of these problems:  Your pancreas does not make enough of a hormone called insulin.  Your body does not react in a normal way to insulin that it makes. Insulin lets sugars (glucose) go into cells in your body. This gives you energy. If you have diabetes, sugars cannot get into cells. This causes high blood sugar (hyperglycemia). Follow these instructions at home: How is diabetes treated? ? You may need to take insulin or other diabetes medicines daily to keep your blood sugar in balance. Take your diabetes medicines every day as told by your doctor.  How do I manage my blood sugar?  Check your blood sugar levels using a blood glucose monitor as directed by your doctor. Your doctor will set treatment goals for you. Generally, you should have these blood sugar levels:  Before meals (preprandial): 80-130 mg/dL (4.4-7.2 mmol/L).  After meals (postprandial): below 180 mg/dL (10 mmol/L).  A1c level: less than 7%.   What do I need to know about low blood sugar? Low blood sugar is called hypoglycemia. This is when blood sugar is at or below 70 mg/dL (3.9 mmol/L). Symptoms may include:  Feeling: ? Hungry. ? Worried or nervous (anxious). ? Sweaty and clammy. ? Confused. ? Dizzy. ? Sleepy. ? Sick to your stomach (nauseous).  Having: ? A fast heartbeat. ? A headache. ? A change in your vision. ? Tingling or no feeling (numbness) around the mouth, lips, or tongue. ? Jerky movements that you cannot control (seizure).  Having trouble with: ? Moving (coordination). ? Sleeping. ? Passing out (fainting). ? Getting upset easily (irritability). Treating low blood sugar To treat low blood sugar, eat or drink something sugary right away. If you can think clearly and  swallow safely, follow the 15:15 rule:  Take 15 grams of a fast-acting carb (carbohydrate). Talk with your doctor about how much you should take.  Some fast-acting carbs are: ? Sugar tablets (glucose pills). Take 3-4 glucose pills. ? 6-8 pieces of hard candy. ? 4-6 oz (120-150 mL) of fruit juice. ? 4-6 oz (120-150 mL) of regular (not diet) soda. ? 1 Tbsp (15 mL) honey or sugar.  Check your blood sugar 15 minutes after you take the carb.  If your blood sugar is still at or below 70 mg/dL (3.9 mmol/L), take 15 grams of a carb again.  If your blood sugar does not go above 70 mg/dL (3.9 mmol/L) after 3 tries, get help right away.  After your blood sugar goes back to normal, eat a meal or a snack within 1 hour. Treating very low blood sugar If your blood sugar is at or below 54 mg/dL (3 mmol/L), you have very low blood sugar (severe hypoglycemia). This is an emergency. Do not wait to see if the symptoms will go away. Get medical help right away. Call your local emergency services (911 in the [place] Do not drive yourself to the hospital. Questions to ask your health care provider  Do I need to meet with a diabetes educator?  What equipment will I need to care for myself at home?  What diabetes medicines do I need? When should I take them?  How often do I need to check my blood sugar?  What number can I call if I have questions?  When is my next doctor's visit?  Where can I find a support group for people with diabetes? Where to find more information  American Diabetes Association: www.diabetes.org  American Association of Diabetes Educators: www.diabeteseducator.org/patient-resources Contact a doctor if:  Your blood sugar is at or above 240 mg/dL (13.3 mmol/L) for 2 days in a row.  You have been sick or have had a fever for 2 days or more, and you are not getting better.  You have any of these problems for more than 6 hours: ? You cannot eat or drink. ? You feel sick to your  stomach (nauseous). ? You throw up (vomit). ? You have watery poop (diarrhea). Get help right away if:  Your blood sugar is lower than 54 mg/dL (3 mmol/L).  You get confused.  You have trouble: ? Thinking clearly. ? Breathing. Summary  Diabetes (diabetes mellitus) is a long-term (chronic) disease. It occurs when the body does not properly use sugar (glucose) that is released from food after digestion.  Take insulin and diabetes medicines as told.  Check your blood sugar every day, as often as told.  Keep all follow-up visits as told by your doctor. This is important. This information is not intended to replace advice given to you by your health care provider. Make sure you discuss any questions you have with your health care provider. Document Revised: 10/24/2018 Document Reviewed: 05/05/2017 Elsevier Patient Education  [place].

## 2019-11-11 NOTE — Progress Notes (Signed)
Established Patient Office Visit  Subjective:  Patient ID: [name], female    DOB: 21-May-1960  Age: 59 y.o. MRN: [id number]  CC:  Chief Complaint  Patient presents with  . Diabetes  . Hyperlipidemia  . Flu Vaccine    previous reaction to flu shot, swelling to the extent of being on multiple antihistamines and was taken out of work for 2 weeks, wondering if she should try it again    HPI [name] presents for follow-up for thyroid and diabetes.  She reports she has had difficulty remember her morning dose of metformin, but has been taking her evening dose consistently. She denies any side effects. She denies any blood sugar elevations or low blood sugar events. She has been working to increase her activity and is monitoring her diet. She denies increased thirst, increased urination, or increased hunger.   She denies any signs of hypo or hyperthyroidism. She reports that her mood has fluctuated some, but she feels that this is mostly due to dealing with the loss of her daughter [name] this year. She has had some weight fluctuation, but reports that this is due to not exercising as often as she was. She denies hair loss or excess fatigue.   Past Medical History:  Diagnosis Date  . ALLERGIC RHINITIS 04/10/2008  . Asthma    asthmatic bronchitis  . Breast lump in female   . Carpal tunnel syndrome of right wrist   . DEPRESSION 08/13/2008  . Diabetes mellitus without complication ([facility])   . Hepatitis    fatty liver  . HYPERLIPIDEMIA 08/04/2006  . HYPERTENSION 08/04/2006  . HYPOTHYROIDISM 08/04/2006  . Kidney injury   . LOW BACK PAIN 08/04/2006  . Neuromuscular disorder ([facility])   . PERIMENOPAUSAL SYNDROME 05/08/2008  . PVD (peripheral vascular disease) ([facility])    occlusive, status post bifem bypass 2007    Past Surgical History:  Procedure Laterality Date  . CARPAL TUNNEL RELEASE Right 04/05/2016   Procedure: OPEN RIGHT CARPAL TUNNEL RELEASE;  Surgeon: Jessy Oto, MD;  Location:  [facility];  Service: Orthopedics;  Laterality: Right;  . CHOLECYSTECTOMY    . FEMORAL BYPASS     bifem.  Marland Kitchen HAGLAND'S DEFORMITY EXCISION    . PLANTAR FASCIA RELEASE Right 04/18/2018   Procedure: PLANTAR FASCIA RELEASE AND GASTROCNEMIUS RECESSION RIGHT;  Surgeon: Newt Minion, MD;  Location: [facility];  Service: Orthopedics;  Laterality: Right;  . TUBAL LIGATION      Family History  Problem Relation Age of Onset  . Hypertension Mother   . Heart disease Father   . Hyperlipidemia Father   . Hypertension Father   . Alcohol abuse Brother   . Diabetes Maternal Grandmother   . Alcohol abuse Maternal Uncle   . Stroke Maternal Uncle   . Ovarian cancer Daughter     Social History   Socioeconomic History  . Marital status: Married    Spouse name: Not on file  . Number of children: 3  . Years of education: Not on file  . Highest education level: Not on file  Occupational History  . Not on file  Tobacco Use  . Smoking status: Former Smoker    Quit date: 02/15/2004    Years since quitting: 15.7  . Smokeless tobacco: Never Used  Vaping Use  . Vaping Use: Never used  Substance and Sexual Activity  . Alcohol use: Yes    Comment: occasional  . Drug use: No  . Sexual  activity: Yes    Birth control/protection: Post-menopausal  Other Topics Concern  . Not on file  Social History Narrative  . Not on file   Social Determinants of Health   Financial Resource Strain:   . Difficulty of Paying Living Expenses: Not on file  Food Insecurity:   . Worried About Charity fundraiser in the Last Year: Not on file  . Ran Out of Food in the Last Year: Not on file  Transportation Needs:   . Lack of Transportation (Medical): Not on file  . Lack of Transportation (Non-Medical): Not on file  Physical Activity:   . Days of Exercise per Week: Not on file  . Minutes of Exercise per Session: Not on file  Stress:   . Feeling of Stress : Not on file  Social Connections:   . Frequency of  Communication with Friends and Family: Not on file  . Frequency of Social Gatherings with Friends and Family: Not on file  . Attends Religious Services: Not on file  . Active Member of Clubs or Organizations: Not on file  . Attends Archivist Meetings: Not on file  . Marital Status: Not on file  Intimate Partner Violence:   . Fear of Current or Ex-Partner: Not on file  . Emotionally Abused: Not on file  . Physically Abused: Not on file  . Sexually Abused: Not on file    Outpatient Medications Prior to Visit  Medication Sig Dispense Refill  . albuterol (PROVENTIL) (2.5 MG/3ML) 0.083% nebulizer solution Take 3 mLs (2.5 mg total) by nebulization every 6 (six) hours as needed for wheezing or shortness of breath. 150 mL 1  . albuterol (VENTOLIN HFA) 108 (90 Base) MCG/ACT inhaler Inhale 1-2 puffs into the lungs every 4 (four) hours as needed for wheezing or shortness of breath. 8 g 2  . ALPRAZolam (XANAX) 0.25 MG tablet Take 1 tablet (0.25 mg total) by mouth 2 (two) times daily as needed for anxiety. 30 tablet 0  . aspirin EC 81 MG tablet Take 1 tablet (81 mg total) by mouth daily. Swallow whole. 90 tablet 3  . azelastine (ASTELIN) 0.1 % nasal spray Place 1 spray into both nostrils daily as needed for rhinitis. 30 mL 5  . B Complex Vitamins (VITAMIN B COMPLEX PO) Take 1 tablet by mouth daily.    . benazepril (LOTENSIN) 10 MG tablet Take 1 tablet (10 mg total) by mouth daily. 30 tablet 5  . diclofenac Sodium (VOLTAREN) 1 % GEL Apply topically.    . famotidine (PEPCID) 40 MG tablet Take 40 mg by mouth at bedtime.    . fluticasone furoate-vilanterol (BREO ELLIPTA) 100-25 MCG/INH AEPB Inhale 1 puff into the lungs daily. 28 each 5  . ibuprofen (ADVIL,MOTRIN) 200 MG tablet Take 400-600 mg by mouth every 6 (six) hours as needed for headache or moderate pain.    Marland Kitchen levothyroxine (SYNTHROID) 88 MCG tablet Take 1 tablet (88 mcg total) by mouth daily. 90 tablet 3  . Menthol, Topical Analgesic,  (ICY HOT EX) Apply 1 application topically daily as needed (pain).    . mupirocin ointment (BACTROBAN) 2 % Apply to affected area TID for 7 days. 30 g 3  . rosuvastatin (CRESTOR) 40 MG tablet Take 1 tablet (40 mg total) by mouth daily. 90 tablet 3  . umeclidinium bromide (INCRUSE ELLIPTA) 62.5 MCG/INH AEPB Inhale 1 puff into the lungs daily. 7 each 5  . venlafaxine XR (EFFEXOR-XR) 75 MG 24 hr capsule TAKE 3 CAPSULES (  225 MG TOTAL) BY MOUTH DAILY WITH BREAKFAST. 270 capsule 1  . doxycycline (VIBRA-TABS) 100 MG tablet Take 1 tablet (100 mg total) by mouth 2 (two) times daily. 14 tablet 0  . hydrOXYzine (ATARAX/VISTARIL) 50 MG tablet Take 1 tablet (50 mg total) by mouth at bedtime and may repeat dose one time if needed. 60 tablet 3  . metFORMIN (GLUCOPHAGE) 500 MG tablet Take 1 tablet (500 mg total) by mouth 2 (two) times daily with a meal. Take 1 tab (592m) once a day for the first week then increase to 1 tab (5058m twice a day with meals. 180 tablet 3  . metoprolol tartrate (LOPRESSOR) 50 MG tablet Take 1 tablet (50 mg total) by mouth 2 (two) times daily. 60 tablet 5   No facility-administered medications prior to visit.    Allergies  Allergen Reactions  . Influenza Virus Vacc Split Pf Swelling and Other (See Comments)    Swelling around injection site  . Gluten Meal Diarrhea  . Lipitor [Atorvastatin] Other (See Comments)    Hepatis     ROS Review of Systems Systems evaluated- negative except for what is listed in HPI   Objective:    Physical Exam Vitals and nursing note reviewed.  Constitutional:      Appearance: Normal appearance.  HENT:     Head: Normocephalic.  Eyes:     Extraocular Movements: Extraocular movements intact.     Conjunctiva/sclera: Conjunctivae normal.     Pupils: Pupils are equal, round, and reactive to light.  Neck:     Vascular: No carotid bruit.  Cardiovascular:     Rate and Rhythm: Normal rate and regular rhythm.     Pulses: Normal pulses.     Heart  sounds: Normal heart sounds.  Pulmonary:     Effort: Pulmonary effort is normal.     Breath sounds: Normal breath sounds.  Abdominal:     General: Abdomen is flat. Bowel sounds are normal. There is no distension.     Palpations: Abdomen is soft.  Musculoskeletal:        General: Normal range of motion.     Cervical back: Normal range of motion.     Right lower leg: No edema.     Left lower leg: No edema.  Skin:    General: Skin is warm and dry.     Capillary Refill: Capillary refill takes less than 2 seconds.  Neurological:     General: No focal deficit present.     Mental Status: She is alert and oriented to person, place, and time.  Psychiatric:        Mood and Affect: Mood normal.        Behavior: Behavior normal.        Thought Content: Thought content normal.        Judgment: Judgment normal.     BP 108/70   Pulse 61   Temp 97.9 F (36.6 C) (Oral)   Ht 5' 4"  (1.626 m)   Wt 207 lb 9.6 oz (94.2 kg)   SpO2 96%   BMI 35.63 kg/m  Wt Readings from Last 3 Encounters:  11/11/19 207 lb 9.6 oz (94.2 kg)  10/31/19 208 lb (94.3 kg)  10/24/19 207 lb (93.9 kg)     There are no preventive care reminders to display for this patient.  There are no preventive care reminders to display for this patient.  Lab Results  Component Value Date   TSH 1.41 05/17/2019   Lab Results  Component Value Date   WBC 7.3 05/17/2019   HGB 14.2 05/17/2019   HCT 41.5 05/17/2019   MCV 89.1 05/17/2019   PLT 192 05/17/2019   Lab Results  Component Value Date   NA 139 05/17/2019   K 4.3 05/17/2019   CO2 21 05/17/2019   GLUCOSE 118 (H) 05/17/2019   BUN 16 05/17/2019   CREATININE 0.97 05/17/2019   BILITOT 0.4 05/17/2019   ALKPHOS 116 04/18/2018   AST 20 05/17/2019   ALT 25 05/17/2019   PROT 6.7 05/17/2019   ALBUMIN 3.7 04/18/2018   CALCIUM 9.3 05/17/2019   ANIONGAP 11 04/18/2018   GFR 74.37 03/25/2013   Lab Results  Component Value Date   CHOL 193 05/17/2019   Lab Results   Component Value Date   HDL 41 (L) 05/17/2019   Lab Results  Component Value Date   LDLCALC 130 (H) 05/17/2019   Lab Results  Component Value Date   TRIG 117 05/17/2019   Lab Results  Component Value Date   CHOLHDL 4.7 05/17/2019   Lab Results  Component Value Date   HGBA1C 6.8 (A) 11/11/2019      Assessment & Plan:   Problem List Items Addressed This Visit      Cardiovascular and Mediastinum   Essential hypertension    Blood pressure well controlled on current medication regimen. Patient does report difficulty remembering to take medication doses twice a day and therefore is requesting a change from metoprolol tartrate to metoprolol succinate so that she can avoid the twice daily dosing. New prescription sent to pharmacy.      Relevant Medications   metoprolol succinate (TOPROL-XL) 100 MG 24 hr tablet   Peripheral vascular disease (HCC)    Chronic peripheral pulses good today. Continue statin therapy. We will recheck fasting lipids near the beginning of November.  Orders have already been placed by Dr. Stanford Breed.      Relevant Medications   metoprolol succinate (TOPROL-XL) 100 MG 24 hr tablet   Type 2 diabetes mellitus with vascular disease (HCC) - Primary    A1c is 6.7% today.  She is fairly stable right now even with the dosing changes of Metformin due to inability to remember to take dose in the mornings. We will change Metformin to extended release version and change dosing to 1000 mg every morning or evening whichever is most easier for patient to remember. Follow-up in approximately 6 months.      Relevant Medications   metoprolol succinate (TOPROL-XL) 100 MG 24 hr tablet   metFORMIN (GLUCOPHAGE XR) 500 MG 24 hr tablet   Other Relevant Orders   POCT glycosylated hemoglobin (Hb A1C) (Completed)     Endocrine   Hypothyroidism    Chronic pain levothyroxine. We will obtain TSH when labs are drawn for lipids in approximately 1 month. Follow-up in  approximately 6 months or sooner if needed.       Relevant Medications   metoprolol succinate (TOPROL-XL) 100 MG 24 hr tablet   Other Relevant Orders   TSH     Other   Depression, major, single episode, moderate (HCC)    Depression stable on current medication regimen She is still grieving over the loss of her daughter however this is natural and expected given that is not been quite a year since her passing. Recommend continuing medication regimen with Effexor daily and alprazolam as needed for intermittent periods of increased anxiety. Follow-up in approximately 6 months or sooner if needed.  Morbid obesity (HCC)    Chronic.  Increasing her exercise and improving her diet. Encouraged continued exercise and dietary changes. May consider injectable medication for diabetes which may also help with weight at her next visit. Follow-up in 6 months or sooner if needed.      Relevant Medications   metFORMIN (GLUCOPHAGE XR) 500 MG 24 hr tablet      Meds ordered this encounter  Medications  . metoprolol succinate (TOPROL-XL) 100 MG 24 hr tablet    Sig: Take 1 tablet (100 mg total) by mouth daily. Take with or immediately following a meal.    Dispense:  90 tablet    Refill:  3  . metFORMIN (GLUCOPHAGE XR) 500 MG 24 hr tablet    Sig: Take 2 tablets (1,000 mg total) by mouth daily with breakfast.    Dispense:  180 tablet    Refill:  1    Follow-up: 6 months or sooner if needed.    Orma Render, NP

## 2019-11-11 NOTE — Assessment & Plan Note (Signed)
>>  ASSESSMENT AND PLAN FOR TYPE 2 DIABETES MELLITUS WITH COMPLICATIONS (HCC) WRITTEN ON 11/11/2019  7:00 PM BY Daija Routson E, NP  A1c is 6.7% today.  She is fairly stable right now even with the dosing changes of Metformin  due to inability to remember to take dose in the mornings. We will change Metformin  to extended release version and change dosing to 1000 mg every morning or evening whichever is most easier for patient to remember. Follow-up in approximately 6 months.

## 2019-11-11 NOTE — Assessment & Plan Note (Signed)
Chronic pain levothyroxine. We will obtain TSH when labs are drawn for lipids in approximately 1 month. Follow-up in approximately 6 months or sooner if needed.

## 2019-11-11 NOTE — Progress Notes (Signed)
Copy of negative [facility] mailed to pt's home address.

## 2019-11-11 NOTE — Assessment & Plan Note (Signed)
Chronic.  Increasing her exercise and improving her diet. Encouraged continued exercise and dietary changes. May consider injectable medication for diabetes which may also help with weight at her next visit. Follow-up in 6 months or sooner if needed.

## 2019-11-11 NOTE — Assessment & Plan Note (Signed)
Blood pressure well controlled on current medication regimen. Patient does report difficulty remembering to take medication doses twice a day and therefore is requesting a change from metoprolol tartrate to metoprolol succinate so that she can avoid the twice daily dosing. New prescription sent to pharmacy.

## 2019-11-11 NOTE — Assessment & Plan Note (Signed)
Chronic peripheral pulses good today. Continue statin therapy. We will recheck fasting lipids near the beginning of November.  Orders have already been placed by Dr. Stanford Breed.

## 2019-11-11 NOTE — Assessment & Plan Note (Signed)
A1c is 6.7% today.  She is fairly stable right now even with the dosing changes of Metformin due to inability to remember to take dose in the mornings. We will change Metformin to extended release version and change dosing to 1000 mg every morning or evening whichever is most easier for patient to remember. Follow-up in approximately 6 months.

## 2019-11-11 NOTE — Assessment & Plan Note (Signed)
Depression stable on current medication regimen She is still grieving over the loss of her daughter however this is natural and expected given that is not been quite a year since her passing. Recommend continuing medication regimen with Effexor daily and alprazolam as needed for intermittent periods of increased anxiety. Follow-up in approximately 6 months or sooner if needed.

## 2019-11-13 ENCOUNTER — Other Ambulatory Visit: Payer: Self-pay | Admitting: Nurse Practitioner

## 2019-11-13 DIAGNOSIS — I10 Essential (primary) hypertension: Secondary | ICD-10-CM

## 2019-12-12 ENCOUNTER — Other Ambulatory Visit: Payer: Self-pay

## 2019-12-12 ENCOUNTER — Ambulatory Visit (INDEPENDENT_AMBULATORY_CARE_PROVIDER_SITE_OTHER): Payer: 59 | Admitting: Specialist

## 2019-12-12 ENCOUNTER — Encounter: Payer: Self-pay | Admitting: Specialist

## 2019-12-12 VITALS — BP 129/86 | HR 76 | Ht 64.0 in | Wt 207.0 lb

## 2019-12-12 DIAGNOSIS — M75111 Incomplete rotator cuff tear or rupture of right shoulder, not specified as traumatic: Secondary | ICD-10-CM

## 2019-12-12 DIAGNOSIS — M778 Other enthesopathies, not elsewhere classified: Secondary | ICD-10-CM

## 2019-12-12 DIAGNOSIS — M47812 Spondylosis without myelopathy or radiculopathy, cervical region: Secondary | ICD-10-CM

## 2019-12-12 DIAGNOSIS — M25511 Pain in right shoulder: Secondary | ICD-10-CM | POA: Diagnosis not present

## 2019-12-12 DIAGNOSIS — M7541 Impingement syndrome of right shoulder: Secondary | ICD-10-CM | POA: Diagnosis not present

## 2019-12-12 MED ORDER — BUPIVACAINE HCL 0.25 % IJ SOLN
4.0000 mL | INTRAMUSCULAR | Status: AC | PRN
  Administered 2019-12-12: 4 mL via INTRA_ARTICULAR

## 2019-12-12 MED ORDER — ACETAMINOPHEN-CODEINE #3 300-30 MG PO TABS: 1.0000 | ORAL_TABLET | ORAL | 0 refills | Status: AC | PRN

## 2019-12-12 MED ORDER — NAPROXEN 375 MG PO TABS
375.0000 mg | ORAL_TABLET | Freq: Two times a day (BID) | ORAL | 1 refills | Status: DC
Start: 2019-12-12 — End: 2020-05-11

## 2019-12-12 MED ORDER — METHYLPREDNISOLONE ACETATE 40 MG/ML IJ SUSP
40.0000 mg | INTRAMUSCULAR | Status: AC | PRN
  Administered 2019-12-12: 40 mg via INTRA_ARTICULAR

## 2019-12-12 NOTE — Patient Instructions (Signed)
Avoid overhead lifting and overhead use of the arms. Pillows to keep from sleeping directly on the shoulders Limited lifting to less than 10 lbs. Ice or heat for relief. NSAIDs are helpful, such as alleve, naprosyn or motrin, be careful not to use in excess as they place burdens on the kidney. Stretching exercise help and strengthening is helpful to build endurance. Marland Kitchen Hemp CBD capsules, amazon.com 5,000-7,000 mg per bottle, 60 capsules per bottle, take one capsule twice a day.

## 2019-12-12 NOTE — Progress Notes (Signed)
Office Visit Note   Patient: [name]           Date of Birth: 04/01/1960           MRN: [id number] Visit Date: 12/12/2019              Requested by: Silverio Decamp, [address],  [place] [address] PCP: Silverio Decamp, MD   Assessment & Plan: Visit Diagnoses:  1. Right shoulder pain, unspecified chronicity   2. Nontraumatic incomplete tear of right rotator cuff   3. Impingement syndrome of right shoulder   4. Right shoulder tendonitis     Plan:Avoid overhead lifting and overhead use of the arms. Pillows to keep from sleeping directly on the shoulders Limited lifting to less than 10 lbs. Ice or heat for relief. NSAIDs are helpful, such as alleve, naprosyn or motrin, be careful not to use in excess as they place burdens on the kidney. Stretching exercise help and strengthening is helpful to build endurance. Marland Kitchen Hemp CBD capsules, amazon.com 5,000-7,000 mg per bottle, 60 capsules per bottle, take one capsule twice a day.   Follow-Up Instructions: No follow-ups on file.   Orders:  Orders Placed This Encounter  Procedures  . Large Joint Inj: R subacromial bursa   No orders of the defined types were placed in this encounter.     Procedures: Large Joint Inj: R subacromial bursa on 12/12/2019 10:33 AM Indications: pain Details: 25 G 1.5 in needle, anterolateral approach  Arthrogram: No  Medications: 40 mg methylPREDNISolone acetate 40 MG/ML; 4 mL bupivacaine 0.25 % Outcome: tolerated well, no immediate complications Procedure, treatment alternatives, risks and benefits explained, specific risks discussed. Consent was given by the patient. Immediately prior to procedure a time out was called to verify the correct patient, procedure, equipment, support staff and site/side marked as required. Patient was prepped and draped in the usual sterile fashion.       Clinical Data: No additional findings.   Subjective: Chief  Complaint  Patient presents with  . Right Shoulder - Pain  . Neck - Pain    59 year old female with history of right shoulder pain and right neck pain and radiation along the right neck and right suboccipital area. No numbness. But sporatic tingling into the right shoulder blade. Lidocaine patches help. Had injection of the right shoulder and it helped for About a year but the last injection didn't help. She had recent loss of her daughter due to ovarian ca. February 2021. Pain with raising the right arm. Lying on the right shoulder helps the pain. No bowel or bladder difficulty and there is no balance or falling spells. Always been off  Balance. Cough or sneeze is not painful.   Review of Systems   Objective: Vital Signs: BP 129/86 (BP Location: Left Arm, Patient Position: Sitting)   Pulse 76   Ht 5' 4"  (1.626 m)   Wt 207 lb (93.9 kg)   BMI 35.53 kg/m   Physical Exam Constitutional:      Appearance: She is well-developed.  HENT:     Head: Normocephalic and atraumatic.  Eyes:     Pupils: Pupils are equal, round, and reactive to light.  Pulmonary:     Effort: Pulmonary effort is normal.     Breath sounds: Normal breath sounds.  Abdominal:     General: Bowel sounds are normal.     Palpations: Abdomen is soft.  Musculoskeletal:  Cervical back: Normal range of motion and neck supple.     Lumbar back: Negative right straight leg raise test and negative left straight leg raise test.  Skin:    General: Skin is warm and dry.  Neurological:     Mental Status: She is alert and oriented to person, place, and time.  Psychiatric:        Behavior: Behavior normal.        Thought Content: Thought content normal.        Judgment: Judgment normal.     Back Exam   Tenderness  The patient is experiencing tenderness in the cervical.  Range of Motion  Extension: abnormal  Flexion: normal  Lateral bend right: normal  Lateral bend left: normal  Rotation right: normal    Rotation left: normal   Muscle Strength  Right Quadriceps:  5/5  Left Quadriceps:  5/5  Right Hamstrings:  5/5  Left Hamstrings:  5/5   Tests  Straight leg raise right: negative Straight leg raise left: negative  Reflexes  Patellar: 2/4 Achilles: 2/4 Babinski's sign: normal   Other  Toe walk: normal Heel walk: normal   Right Shoulder Exam   Tenderness  The patient is experiencing tenderness in the acromioclavicular joint and acromion.  Range of Motion  Active abduction:  150 abnormal  Passive abduction:  170 normal  Extension: 60  External rotation:  90 normal  Forward flexion: normal  Internal rotation 0 degrees: T7  Internal rotation 90 degrees: 60   Muscle Strength  Abduction: 4/5  Internal rotation: 4/5   Tests  Apprehension: positive Hawkins test: positive Impingement: positive  Other  Erythema: absent Sensation: normal Pulse: present      Specialty Comments:  No specialty comments available.  Imaging: No results found.   PMFS History: Patient Active Problem List   Diagnosis Date Noted  . Cellulitis 10/31/2019  . Vulvar candidiasis 10/31/2019  . Primary insomnia 10/31/2019  . Abnormality of right breast on screening mammogram 10/14/2019  . DDD (degenerative disc disease), cervical 08/30/2019  . Nonalcoholic steatohepatitis ([facility]) 08/08/2019  . Chronic bronchitis with productive mucopurulent cough ([facility]) 08/08/2019  . Postmenopausal bleeding 07/17/2019  . Moderate persistent asthma with acute bronchitis and acute exacerbation 06/27/2019  . Patellofemoral syndrome, right 06/19/2019  . Chronic right shoulder pain 05/07/2019  . Achilles tendon contracture, right   . Migraine with aura and without status migrainosus, not intractable 05/31/2017  . Plantar fasciitis of right foot 04/12/2017  . Asthma 04/12/2017  . SOB (shortness of breath) 07/12/2016  . Plantar fasciitis, left 11/17/2015  . Fatigue 11/17/2015  . S/P aorto-bifemoral  bypass surgery 02/12/2015  . Type 2 diabetes mellitus with vascular disease ([facility]) 01/14/2015  . Vitamin D deficiency 01/14/2015  . Morbid obesity ([facility]) 01/13/2015  . Fatty liver disease, nonalcoholic 70/62/3762  . Peripheral vascular disease ([facility]) 07/15/2011  . Depression, major, single episode, moderate ([facility]) 08/13/2008  . Allergic rhinitis 04/10/2008  . Hypothyroidism 08/04/2006  . Dyslipidemia 08/04/2006  . Essential hypertension 08/04/2006   Past Medical History:  Diagnosis Date  . ALLERGIC RHINITIS 04/10/2008  . Asthma    asthmatic bronchitis  . Breast lump in female   . Carpal tunnel syndrome of right wrist   . DEPRESSION 08/13/2008  . Diabetes mellitus without complication ([facility])   . Hepatitis    fatty liver  . HYPERLIPIDEMIA 08/04/2006  . HYPERTENSION 08/04/2006  . HYPOTHYROIDISM 08/04/2006  . Kidney injury   . LOW BACK PAIN 08/04/2006  . Neuromuscular  disorder ([facility])   . PERIMENOPAUSAL SYNDROME 05/08/2008  . PVD (peripheral vascular disease) ([facility])    occlusive, status post bifem bypass 2007    Family History  Problem Relation Age of Onset  . Hypertension Mother   . Heart disease Father   . Hyperlipidemia Father   . Hypertension Father   . Alcohol abuse Brother   . Diabetes Maternal Grandmother   . Alcohol abuse Maternal Uncle   . Stroke Maternal Uncle   . Ovarian cancer Daughter     Past Surgical History:  Procedure Laterality Date  . CARPAL TUNNEL RELEASE Right 04/05/2016   Procedure: OPEN RIGHT CARPAL TUNNEL RELEASE;  Surgeon: Jessy Oto, MD;  Location: [facility];  Service: Orthopedics;  Laterality: Right;  . CHOLECYSTECTOMY    . FEMORAL BYPASS     bifem.  Marland Kitchen HAGLAND'S DEFORMITY EXCISION    . PLANTAR FASCIA RELEASE Right 04/18/2018   Procedure: PLANTAR FASCIA RELEASE AND GASTROCNEMIUS RECESSION RIGHT;  Surgeon: Newt Minion, MD;  Location: [facility];  Service: Orthopedics;  Laterality: Right;  . TUBAL LIGATION     Social History   Occupational  History  . Not on file  Tobacco Use  . Smoking status: Former Smoker    Quit date: 02/15/2004    Years since quitting: 15.8  . Smokeless tobacco: Never Used  Vaping Use  . Vaping Use: Never used  Substance and Sexual Activity  . Alcohol use: Yes    Comment: occasional  . Drug use: No  . Sexual activity: Yes    Birth control/protection: Post-menopausal

## 2019-12-23 ENCOUNTER — Other Ambulatory Visit: Payer: Self-pay | Admitting: Sports Medicine

## 2019-12-23 MED ORDER — LEVOTHYROXINE SODIUM 88 MCG PO TABS
88.0000 ug | ORAL_TABLET | Freq: Every day | ORAL | 0 refills | Status: DC
  Filled 2020-12-08: qty 30, 30d supply, fill #0

## 2019-12-24 ENCOUNTER — Telehealth (INDEPENDENT_AMBULATORY_CARE_PROVIDER_SITE_OTHER): Payer: 59 | Admitting: Nurse Practitioner

## 2019-12-24 ENCOUNTER — Encounter: Payer: Self-pay | Admitting: Nurse Practitioner

## 2019-12-24 DIAGNOSIS — J0110 Acute frontal sinusitis, unspecified: Secondary | ICD-10-CM | POA: Insufficient documentation

## 2019-12-24 DIAGNOSIS — J0111 Acute recurrent frontal sinusitis: Secondary | ICD-10-CM

## 2019-12-24 DIAGNOSIS — R059 Cough, unspecified: Secondary | ICD-10-CM

## 2019-12-24 DIAGNOSIS — B3731 Acute candidiasis of vulva and vagina: Secondary | ICD-10-CM

## 2019-12-24 DIAGNOSIS — B373 Candidiasis of vulva and vagina: Secondary | ICD-10-CM

## 2019-12-24 DIAGNOSIS — J209 Acute bronchitis, unspecified: Secondary | ICD-10-CM

## 2019-12-24 HISTORY — DX: Acute frontal sinusitis, unspecified: J01.10

## 2019-12-24 HISTORY — DX: Acute recurrent frontal sinusitis: J01.11

## 2019-12-24 MED ORDER — FLUCONAZOLE 150 MG PO TABS: ORAL_TABLET | ORAL | 1 refills | Status: DC

## 2019-12-24 MED ORDER — LEVOFLOXACIN 500 MG PO TABS: 500.0000 mg | ORAL_TABLET | Freq: Every day | ORAL | 0 refills | Status: DC

## 2019-12-24 MED ORDER — PREDNISONE 20 MG PO TABS: 40.0000 mg | ORAL_TABLET | Freq: Every day | ORAL | 0 refills | Status: DC

## 2019-12-24 NOTE — Patient Instructions (Signed)
I have called in a prescription for Levofloxacin 532m to be taken once a day for 7 days.  I have also called in a prednisone burst- don't start this unless you are feeling more short of breath or if you feel like your symptoms are not improving after the first 2 doses of antibiotic.  If you are not feeling better by Thursday or Friday- please send me a message. I am off on Friday, but will be checking my messages- you can also send me a direct email ([email or web address]) and I will get back with you. I don't want this to get out of control.  I sent in medicine for the yeast infection. Take the first tablet today and then the second one when you finish the antibiotic.   I hope you feel better soon!!!   The following information is provided as a gHuman resources officerfor ADULT patients only and does NOT take into account PREGNANCY, ALLERGIES, LIVER CONDITIONS, KIDNEY CONDITIONS, GASTROINTESTINAL CONDITIONS, OR PRESCRIPTION MEDICATION INTERACTIONS. Please be sure to ask your provider if the following are safe to take with your specific medical history, conditions, or current medication regimen if you are unsure.   Adult Basic Symptom Management for Sinusitis  Congestion: Guaifenesin (Mucinex)- follow directions on packaging with a maximum dose of 24058min a 24 hour period.  Pain/Fever: Ibuprofen 20049m 400m33mery 4-6 hours as needed (MAX 1200mg48ma 24 hour period) Pain/Fever: Tylenol 500mg 19m0mg e31m 6-8 hours as needed (MAX 3000mg in64m4 hour period)  Cough: Dextromethorphan (Delsym)- follow directions on packing with a maximum dose of 120mg in 54m hour period.  Nasal Stuffiness: Saline nasal spray and/or Nettie Pot with sterile saline solution. You can also use Afrin nasal spray for no more than 3 days to help while the antibiotic kicks in  Runny Nose: Fluticasone nasal spray (Flonase) OR Mometasone nasal spray (Nasonex) OR Triamcinolone Acetonide nasal spray (Nasacort)- follow  directions on the packaging  Pain/Pressure: Warm washcloth to the face  Sore Throat: Warm salt water gargles  If you have allergies, you may also consider taking an oral antihistamine (like Zyrtec or Claritin) as these may also help with your symptoms.  **Many medications will have more than one ingredient, be sure you are reading the packaging carefully and not taking more than one dose of the same kind of medication at the same time or too close together. It is OK to use formulas that have all of the ingredients you want, but do not take them in a combined medication and as separate dose too close together. If you have any questions, the pharmacist will be happy to help you decide what is safe.

## 2019-12-24 NOTE — Progress Notes (Addendum)
Virtual Video Visit via MyChart Note  I connected with  [name] on 12/26/19 at 11:20 AM EST by the video enabled telemedicine application for , MyChart, and verified that I am speaking with the correct person using two identifiers.   I introduced myself as a Designer, jewellery with the practice. We discussed the limitations of evaluation and management by telemedicine and the availability of in person appointments. The patient expressed understanding and agreed to proceed.  The patient is: at home I am: in the office  Subjective:    CC:  Chief Complaint  Patient presents with  . URI    got COVID booster on 12/06/2019, symptoms started 3 days after that, crusty/bloody nasal drainage, productive cough of green-yellow mucus, tried taking an old Rx of Augmentin with no relief    HPI: [name] is a 59 y.o. y/o female presenting via [place] today for 2 weeks of symptoms of cough, chest congestion, sinus pain and pressure, sinus congestion, green/yellow mucous production, headache, shortness of breath with exertion, fatigue, and intermittent dizziness. She reports a low grade fever at the onset of symptoms, but that has since resolved.   She denies chest pain, loss of taste, loss of smell, wheezing, nausea, vomiting, diarrhea, palpitations, or body aches.   She does report she used a prescription of Augmentin that she had at home from a previous infection that she ended up not starting due to a change in therapy, but she reports she only had minimal relief of her symptoms after a full course of the antibiotic. She also reports the onset of a vaginal yeast infection after completion of the Augmentin.  She does have a history of a severe respiratory infection earlier this year that was quite difficult to treat. She reports her symptoms are not as bad as they were with that infection.   She has had both of her COVID vaccines and her COVID booster (12/06/2019).  She has had her 2021 season  flu vaccine.     Past medical history, Surgical history, Family history not pertinant except as noted below, Social history, Allergies, and medications have been entered into the medical record, reviewed, and corrections made.   Review of Systems:  See HPI for pertinent positive and negatives  Objective:    General: Speaking clearly in complete sentences, mild shortness of breath noted. She is audibly congested.    Alert and oriented x3.   Normal judgment.  No apparent acute distress.   Impression and Recommendations:   1. Acute non-recurrent frontal sinusitis 2. Acute exacerbation of chronic bronchitis (HCC) Symptoms and presentation consistent with acute nonrecurrent frontal sinusitis in addition to acute exacerbation of chronic bronchitis. We will treat with levofloxacin once a day for 7 days given the patient's significant history for difficult to treat respiratory infections and recent treatment with Augmentin without complete symptom relief. Discussed with the patient the option to begin prednisone burst of 40 mg/day if symptoms are not improved or her shortness of breath begins to worsen after 1-2 doses of the antibiotic. Recommend that she continue to use azelastine nasal spray daily. Over-the-counter symptom management may be helpful with Mucinex, saline nasal rinse, alternating ibuprofen/Tylenol, and a very short term (less than 3 days) of Afrin nasal spray. Discussed with the patient if her symptoms do not improve by Thursday to notify me immediately for further evaluation.  She will need an in person evaluation if her symptoms persist given her history of difficult to treat respiratory infection. -  levofloxacin (LEVAQUIN) 500 MG tablet; Take 1 tablet (500 mg total) by mouth daily.  Dispense: 7 tablet; Refill: 0 - predniSONE (DELTASONE) 20 MG tablet; Take 2 tablets (40 mg total) by mouth daily with breakfast.  Dispense: 10 tablet; Refill: 0  3. Vaginal candidiasis Symptoms  of presentation consistent with vaginal candidiasis related to recent antibiotic therapy. Instructed patient to begin treatment with fluconazole today.  She may repeat the dose when she completes the current antibiotic treatment. If symptoms persist please let me know. - fluconazole (DIFLUCAN) 150 MG tablet; Take one tab today. Repeat dose when antibiotics are complete if yeast persists.  Dispense: 2 tablet; Refill: 1    I discussed the assessment and treatment plan with the patient. The patient was provided an opportunity to ask questions and all were answered. The patient agreed with the plan and demonstrated an understanding of the instructions.   The patient was advised to call back or seek an in-person evaluation if the symptoms worsen or if the condition fails to improve as anticipated.  I provided 20 minutes of non-face-to-face interaction with this [place] visit including intake, same-day documentation, and chart review.   Orma Render, NP

## 2019-12-26 ENCOUNTER — Encounter: Payer: Self-pay | Admitting: Nurse Practitioner

## 2019-12-30 ENCOUNTER — Other Ambulatory Visit: Payer: Self-pay | Admitting: Nurse Practitioner

## 2019-12-30 DIAGNOSIS — J411 Mucopurulent chronic bronchitis: Secondary | ICD-10-CM

## 2019-12-31 ENCOUNTER — Encounter: Payer: Self-pay | Admitting: Physical Therapy

## 2019-12-31 ENCOUNTER — Other Ambulatory Visit: Payer: Self-pay

## 2019-12-31 ENCOUNTER — Ambulatory Visit: Payer: 59 | Attending: Obstetrics & Gynecology | Admitting: Physical Therapy

## 2019-12-31 DIAGNOSIS — M25511 Pain in right shoulder: Secondary | ICD-10-CM | POA: Diagnosis present

## 2019-12-31 DIAGNOSIS — R293 Abnormal posture: Secondary | ICD-10-CM | POA: Diagnosis present

## 2019-12-31 DIAGNOSIS — G8929 Other chronic pain: Secondary | ICD-10-CM

## 2019-12-31 DIAGNOSIS — M542 Cervicalgia: Secondary | ICD-10-CM | POA: Insufficient documentation

## 2019-12-31 DIAGNOSIS — R29898 Other symptoms and signs involving the musculoskeletal system: Secondary | ICD-10-CM | POA: Diagnosis present

## 2019-12-31 DIAGNOSIS — M6281 Muscle weakness (generalized): Secondary | ICD-10-CM | POA: Diagnosis present

## 2019-12-31 NOTE — Therapy (Signed)
[facility] [address]  [place], [place], [address] Phone: [phone]   Fax:  [phone]  Physical Therapy Evaluation  Patient Details  Name: [name] MRN: [id number] Date of Birth: 07-06-1960 Referring Provider (PT): Basil Dess, MD   Encounter Date: 12/31/2019   PT End of Session - 12/31/19 1101    Visit Number 1    Number of Visits 16    Date for PT Re-Evaluation 02/25/20    Authorization Type [employer] - VL: 23 (PT,OT, ST combined)    PT Start Time 1101    PT Stop Time 1202    PT Time Calculation (min) 61 min    Activity Tolerance Patient tolerated treatment well    Behavior During Therapy [facility] for tasks assessed/performed           Past Medical History:  Diagnosis Date  . ALLERGIC RHINITIS 04/10/2008  . Asthma    asthmatic bronchitis  . Breast lump in female   . Carpal tunnel syndrome of right wrist   . DEPRESSION 08/13/2008  . Diabetes mellitus without complication ([place])   . Hepatitis    fatty liver  . HYPERLIPIDEMIA 08/04/2006  . HYPERTENSION 08/04/2006  . HYPOTHYROIDISM 08/04/2006  . Kidney injury   . LOW BACK PAIN 08/04/2006  . Neuromuscular disorder ([place])   . PERIMENOPAUSAL SYNDROME 05/08/2008  . PVD (peripheral vascular disease) ([place])    occlusive, status post bifem bypass 2007    Past Surgical History:  Procedure Laterality Date  . CARPAL TUNNEL RELEASE Right 04/05/2016   Procedure: OPEN RIGHT CARPAL TUNNEL RELEASE;  Surgeon: Jessy Oto, MD;  Location: [facility];  Service: Orthopedics;  Laterality: Right;  . CHOLECYSTECTOMY    . FEMORAL BYPASS     bifem.  Marland Kitchen HAGLAND'S DEFORMITY EXCISION    . PLANTAR FASCIA RELEASE Right 04/18/2018   Procedure: PLANTAR FASCIA RELEASE AND GASTROCNEMIUS RECESSION RIGHT;  Surgeon: Newt Minion, MD;  Location: [facility];  Service: Orthopedics;  Laterality: Right;  . TUBAL LIGATION      There were no vitals filed for this visit.     Subjective Assessment - 12/31/19 1107    Subjective Pt reports onset of R shoulder pain in late 2019 & early 2020 with working out with weights - diagnosed with shoulder impingement and received PT in early 2020 with some benefit noted but had to stop to care for her daughter who was diagnosed with terminal cancer. Pain was better while she was caring for her dtr (she was away from computer) but now that she is back the computer for work her pain has gotten worse. Was told she still has impingement along with OA of AC joint and neck & tera of subacromial bursa. Pain in neck exacerbated last night when she tried to swat at her dog. Pt also notes she has started wearing a postural support bra but keeps losing weight so bra becomes loose.    Pertinent History neck and back pain for many years; spondylosis; plantar fasciitis and Achilles tendonitis; previous Lt foot surgery ~ 3 yrs ago; gall bladder removed 1989; arthritis; asthma; HTN; PVD s/p aorto bifem BPG    Limitations Sitting;House hold activities;Lifting    How long can you sit comfortably? 1 hr    Diagnostic tests 09/08/19 Shoulder MRI: 1. Severe tendinosis of supraspinatus tendon with apartial-thickness bursal surface tear anteriorly.2. Mild tendinosis of the infraspinatus tendon.3. Mild subacromial/subdeltoid bursitis.  Cervical: 1. Cervical spine  spondylosis as described above. 2. No acute osseous injury of the cervical spine.    Patient Stated Goals "to be able to use shoulder more including working out with weights"    Currently in Pain? Yes    Pain Score 4    up to 10/10 at worst   Pain Location Shoulder    Pain Orientation Right;Upper;Anterior    Pain Descriptors / Indicators Aching    Pain Type Chronic pain    Pain Radiating Towards into R bicep    Pain Onset Other (comment)   late 2019   Pain Frequency Constant    Aggravating Factors  working on on computer, playing games on phone, reaching overhead    Pain Relieving Factors injections  from MD, Tylenol-3, Naprosyn, previously used Icy-Hot lidocaine patches    Multiple Pain Sites Yes    Pain Score 3   up to 10/10 at worst   Pain Location Neck    Pain Orientation Right;Upper    Pain Descriptors / Indicators Sharp    Pain Type Chronic pain    Pain Radiating Towards up into head & down into shoudlde blade    Pain Onset More than a month ago   flare-ups every 6 months or so - current exacebation for 2-3 months   Pain Frequency Intermittent    Aggravating Factors  sustained R rotation (looking at computer monitor)    Pain Relieving Factors primarily ice; when bad, will take Tylenol-3, Naprosyn    Effect of Pain on Daily Activities triggers headaches ~biweekly              [facility] PT Assessment - 12/31/19 1101      Assessment   Medical Diagnosis R shoulder and neck pain 2 R shoulder impingement, AC joint arthritis, biceps tendonitis and incomplete RTC tear as well as cervical spondylosis    Referring Provider (PT) Basil Dess, MD    Onset Date/Surgical Date --   late 2019   Hand Dominance Right    Next MD Visit TBD - 3 wks after start of PT    Prior Therapy PT for same issues in early 2020      Precautions   Precautions None      Restrictions   Weight Bearing Restrictions No      Balance Screen   Has the patient fallen in the past 6 months No    Has the patient had a decrease in activity level because of a fear of falling?  No    Is the patient reluctant to leave their home because of a fear of falling?  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     Leisure household chores;  cooking; Medical sales representative; wants to get back to working out      [employer]   Overall Cognitive Status Within Functional Limits for tasks assessed      Observation/Other Assessments   Focus on Therapeutic Outcomes (FOTO)  Shoulder - 54% (46% limitation); Predicted 62% (38% limitation)      Posture/Postural Control  Posture/Postural Control Postural limitations    Postural Limitations Forward head;Rounded Shoulders    Posture Comments shoulders rounded and elevated; head of the humerus anterior in orientation; scapulae abducted and rotated along the thoracic wall       AROM   Right Shoulder Flexion 146 Degrees    Right Shoulder ABduction 160 Degrees    Right Shoulder Internal Rotation --   FIR to T11   Right Shoulder External Rotation 73 Degrees   FER to T2   Left Shoulder Flexion 145 Degrees    Left Shoulder ABduction 158 Degrees    Left Shoulder Internal Rotation --   FIR to T11   Left Shoulder External Rotation 74 Degrees   FER to T2   Cervical Flexion 45    Cervical Extension 38    Cervical - Right Side Bend 25   pain   Cervical - Left Side Bend 16   pain & pulling on R   Cervical - Right Rotation 55    Cervical - Left Rotation 42   pulling on R     Strength   Strength Assessment Site Shoulder    Right/Left Shoulder Right;Left    Right Shoulder Flexion 4-/5   pain   Right Shoulder ABduction 4-/5   pain   Right Shoulder Internal Rotation 4/5    Right Shoulder External Rotation 4-/5    Left Shoulder Flexion 4+/5    Left Shoulder ABduction 4+/5    Left Shoulder Internal Rotation 4/5    Left Shoulder External Rotation 4/5      Palpation   Palpation comment significant muscular tightness through the pecs; anterior deltoid; biceps; upper trap; leveator; teres Rt > Lt                       Objective measurements completed on examination: See above findings.       [facility] Adult PT Treatment/Exercise - 12/31/19 1101      Iontophoresis   Type of Iontophoresis  Dexamethasone    Location R LS    Dose 80 mA-min, 1.0 mL    Time 4-6 hr patch (#1 of 6)                  PT Education - 12/31/19 1200    Education Details PT eval findings, anticipated POC, initial HEP & ionto patch wearing instructions    Person(s) Educated Patient    Methods Explanation;Handout    Comprehension Verbalized understanding;Need further instruction            PT Short Term Goals - 12/31/19 1202      PT SHORT TERM GOAL #1   Title Patient will be independent with initial HEP    Status New    Target Date 01/21/20      PT SHORT TERM GOAL #2   Title Patient will verbalize/demonstrate good awareness of neutral spine posture and proper body mechanics for daily tasks    Status New    Target Date 01/21/20             PT Long Term Goals - 12/31/19 1202      PT LONG TERM GOAL #1   Title Improve posture and alignment with patient to demonstrate improved upright posture with posterior shoulder girdle engaged    Status New    Target Date 02/25/20      PT LONG TERM GOAL #2   Title Decrease pain in the Rt shoulder girdle by 50-75% allowing patient  to use Rt UE for functional activities    Status New    Target Date 02/25/20      PT LONG TERM GOAL #3   Title Patient to improve Rt shoulder AROM to [facility] without pain provocation    Status New      PT LONG TERM GOAL #4   Title Patient to improve cervical  AROM to WFL/WNL without pain provocation to allow adequate viewing of computer monitors for work w/o pain interference    Status New    Target Date 02/25/20      PT LONG TERM GOAL #5   Title Patient will demonstrate improved R strength to >/= 4+/5 for functional UE use    Status New    Target Date 02/25/20      PT LONG TERM GOAL #6   Title Patient will be independent with ongoing/advanced HEP    Status New    Target Date 02/25/20                  Plan - 12/31/19 1202    Clinical Impression Statement [place] is a 59 y/o female who presents to  OP PT for R shoulder and neck pain 2 R shoulder impingement, AC joint arthritis, biceps tendonitis and incomplete RTC tear as well as cervical spondylosis. Pain onset in late 2019 and PT started in early 2020 but discontinued due to COVID-19 and caring for her terminally ill daughter. Pain improved while she was away from work caring for her daughter but returned when she went back to working at the computer. Deficits include poor posture and alignment; limited cervical and shoulder ROM; pain with active ROM and functional activities; muscular tightness and pain with palpation through the cervical spine and bilat shoulder girdles; pain limiting functional activities. Dominga will benefit from skilled PT to address above deficits, improve posture and restore functional ROM and strength to allow her to resume normal daily activities without pain interference.    Personal Factors and Comorbidities Comorbidity 3+;Time since onset of injury/illness/exacerbation;Past/Current Experience;Fitness;Profession    Comorbidities neck and back pain for many years; spondylosis; plantar fasciitis and Achilles tendonitis; previous Lt foot surgery ~ 3 yrs ago; gall bladder removed 1989; arthritis; asthma; HTN; PVD s/p aorto bifem BPG    Examination-Activity Limitations Lift;Carry;Reach Overhead;Sleep;Sit    Examination-Participation Restrictions Cleaning;Community Activity;Driving;Laundry;Meal Prep;Occupation    Stability/Clinical Decision Making Evolving/Moderate complexity    Clinical Decision Making Moderate    Rehab Potential Good    PT Frequency 2x / week    PT Duration 8 weeks    PT Treatment/Interventions ADLs/Self Care Home Management;Cryotherapy;Electrical Stimulation;Iontophoresis 12m/ml Dexamethasone;Moist Heat;Traction;Ultrasound;Functional mobility training;Therapeutic activities;Therapeutic exercise;Neuromuscular re-education;Patient/family education;Manual techniques;Passive range of motion;Dry  needling;Taping;Vasopneumatic Device;Spinal Manipulations;Joint Manipulations    PT Next Visit Plan Review initial HEP; posture and body mechanics education; postural stretching & strengthening; manual work to R shoulder girdle and neck with possible DN;    PT Home Exercise Plan  Access Code: [email or web address]     Consulted and Agree with Plan of Care Patient           Patient will benefit from skilled therapeutic intervention in order to improve the following deficits and impairments:  Decreased activity tolerance, Decreased range of motion, Decreased strength, Hypomobility, Increased fascial restricitons, Increased muscle spasms, Impaired perceived functional ability, Impaired flexibility, Impaired UE functional use, Improper body mechanics, Postural dysfunction, Pain  Visit Diagnosis: Chronic right shoulder pain - Plan: PT plan of care cert/re-cert  Cervicalgia - Plan: PT plan of care  cert/re-cert  Abnormal posture - Plan: PT plan of care cert/re-cert  Other symptoms and signs involving the musculoskeletal system - Plan: PT plan of care cert/re-cert  Muscle weakness (generalized) - Plan: PT plan of care cert/re-cert     Problem List Patient Active Problem List   Diagnosis Date Noted  . Acute non-recurrent frontal sinusitis 12/24/2019  . Cellulitis 10/31/2019  . Vaginal candidiasis 10/31/2019  . Primary insomnia 10/31/2019  . Abnormality of right breast on screening mammogram 10/14/2019  . DDD (degenerative disc disease), cervical 08/30/2019  . Nonalcoholic steatohepatitis ([facility]) 08/08/2019  . Chronic bronchitis with productive mucopurulent cough ([place]) 08/08/2019  . Postmenopausal bleeding 07/17/2019  . Moderate persistent asthma with acute bronchitis and acute exacerbation 06/27/2019  . Patellofemoral syndrome, right 06/19/2019  . Chronic right shoulder pain 05/07/2019  . Achilles tendon contracture, right   . Migraine with aura and without status migrainosus, not intractable  05/31/2017  . Plantar fasciitis of right foot 04/12/2017  . Asthma 04/12/2017  . SOB (shortness of breath) 07/12/2016  . Plantar fasciitis, left 11/17/2015  . Fatigue 11/17/2015  . 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  . Type 2 diabetes mellitus with vascular disease ([place]) 01/14/2015  . Vitamin D deficiency 01/14/2015  . Morbid obesity ([place]) 01/13/2015  . Fatty liver disease, nonalcoholic 30/16/0109  . Peripheral vascular disease ([place]) 07/15/2011  . Depression, major, single episode, moderate ([place]) 08/13/2008  . Allergic rhinitis 04/10/2008  . Hypothyroidism 08/04/2006  . Dyslipidemia 08/04/2006  . Essential hypertension 08/04/2006    Percival Spanish, PT, MPT 12/31/2019, 7:30 PM  [facility] [address]  [place], [place], [address] Phone: [phone]   Fax:  [phone]  Name: SHAMIRA TOUTANT MRN: [id number] Date of Birth: 1961-01-03

## 2019-12-31 NOTE — Patient Instructions (Signed)
Access Code: [email or web address] URL: [email or web address] Date: 12/31/2019 Prepared by: Annie Paras  Exercises Seated Cervical Retraction - 3 x daily - 7 x weekly - 5 reps - 1 sets Standing Scapular Retraction - 3 x daily - 7 x weekly - 10 reps - 1 sets - 10 hold Shoulder External Rotation and Scapular Retraction - 3 x daily - 7 x weekly - 10 reps - 1 sets - hold Standing Scapular Retraction in Abduction - 3 x daily - 7 x weekly - 10 reps - 1 sets Hooklying Shoulder T - 3 x daily - 7 x weekly - 1 reps - 1 sets - 90 sec hold Doorway Pec Stretch at 60 Elevation - 2 x daily - 7 x weekly - 2-3 reps - 1 sets - 15-30 seconds hold Doorway Pec Stretch at 90 Degrees Abduction - 2 x daily - 7 x weekly - 2-3 reps - 1 sets - 15-30 seconds hold Doorway Pec Stretch at 120 Degrees Abduction - 2 x daily - 7 x weekly - 2-3 reps - 1 sets - 15-30 seconds hold Standing Shoulder Extension with Dowel - 1 x daily - 7 x weekly - 10 reps - 1 sets - 5 hold           IONTOPHORESIS PATIENT PRECAUTIONS & CONTRAINDICATIONS:  . Redness under one or both electrodes can occur.  This characterized by a uniform redness that usually disappears within 12 hours of treatment. . Small pinhead size blisters may result in response to the drug.  Contact your physician if the problem persists more than 24 hours. . On rare occasions, iontophoresis therapy can result in temporary skin reactions such as rash, inflammation, irritation or burns.  The skin reactions may be the result of individual sensitivity to the ionic solution used, the condition of the skin at the start of treatment, reaction to the materials in the electrodes, allergies or sensitivity to dexamethasone, or a poor connection between the patch and your skin.  Discontinue using iontophoresis if you have any of these reactions and report to your therapist. . Remove the Patch or electrodes if you have any undue sensation of pain or burning during the treatment  and report discomfort to your therapist. . Tell your Therapist if you have had known adverse reactions to the application of electrical current. Marland Kitchen Approximate treatment time is 4-6 hours.  Remove the patch after 6 hours. . The Patch can be worn during normal activity, however excessive motion where the electrodes have been placed can cause poor contact between the skin and the electrode or uneven electrical current resulting in greater risk of skin irritation. Marland Kitchen Keep out of the reach of children.   . DO NOT use if you have a cardiac pacemaker or any other electrically sensitive implanted device. . DO NOT use if you have a known sensitivity to dexamethasone. . DO NOT use during Magnetic Resonance Imaging (MRI). . DO NOT use over broken or compromised skin (e.g. sunburn, cuts, or acne) due to the increased risk of skin reaction. . DO NOT SHAVE over the area to be treated:  To establish good contact between the Patch and the skin, excessive hair may be clipped. . DO NOT place the Patch or electrodes on or over your eyes, directly over your heart, or brain. . DO NOT reuse the Patch or electrodes as this may cause burns to occur.   For questions, please contact your therapist at:  [facility] [address]  [place], [place], [address] Phone: [phone]   Fax:  [phone]

## 2020-01-02 ENCOUNTER — Ambulatory Visit: Payer: 59

## 2020-01-02 ENCOUNTER — Other Ambulatory Visit: Payer: Self-pay

## 2020-01-02 DIAGNOSIS — R293 Abnormal posture: Secondary | ICD-10-CM

## 2020-01-02 DIAGNOSIS — M25511 Pain in right shoulder: Secondary | ICD-10-CM | POA: Diagnosis not present

## 2020-01-02 DIAGNOSIS — M542 Cervicalgia: Secondary | ICD-10-CM

## 2020-01-02 DIAGNOSIS — G8929 Other chronic pain: Secondary | ICD-10-CM

## 2020-01-02 DIAGNOSIS — M6281 Muscle weakness (generalized): Secondary | ICD-10-CM

## 2020-01-02 DIAGNOSIS — R29898 Other symptoms and signs involving the musculoskeletal system: Secondary | ICD-10-CM

## 2020-01-02 NOTE — Therapy (Addendum)
[facility] [address]  [place], [place], [address] Phone: [phone]   Fax:  [phone]  Physical Therapy Treatment  Patient Details  Name: [name] MRN: [id number] Date of Birth: November 11, 1960 Referring Provider (PT): Basil Dess, MD   Encounter Date: 01/02/2020   PT End of Session - 01/02/20 0944    Visit Number 2    Number of Visits 16    Date for PT Re-Evaluation 02/25/20    Authorization Type [employer] - VL: 76 (PT,OT, ST combined)    PT Start Time 0931    PT Stop Time 1011    PT Time Calculation (min) 40 min    Activity Tolerance Patient tolerated treatment well    Behavior During Therapy [facility] for tasks assessed/performed           Past Medical History:  Diagnosis Date  . ALLERGIC RHINITIS 04/10/2008  . Asthma    asthmatic bronchitis  . Breast lump in female   . Carpal tunnel syndrome of right wrist   . DEPRESSION 08/13/2008  . Diabetes mellitus without complication ([place])   . Hepatitis    fatty liver  . HYPERLIPIDEMIA 08/04/2006  . HYPERTENSION 08/04/2006  . HYPOTHYROIDISM 08/04/2006  . Kidney injury   . LOW BACK PAIN 08/04/2006  . Neuromuscular disorder ([place])   . PERIMENOPAUSAL SYNDROME 05/08/2008  . PVD (peripheral vascular disease) ([place])    occlusive, status post bifem bypass 2007    Past Surgical History:  Procedure Laterality Date  . CARPAL TUNNEL RELEASE Right 04/05/2016   Procedure: OPEN RIGHT CARPAL TUNNEL RELEASE;  Surgeon: Jessy Oto, MD;  Location: [place];  Service: Orthopedics;  Laterality: Right;  . CHOLECYSTECTOMY    . FEMORAL BYPASS     bifem.  Marland Kitchen HAGLAND'S DEFORMITY EXCISION    . PLANTAR FASCIA RELEASE Right 04/18/2018   Procedure: PLANTAR FASCIA RELEASE AND GASTROCNEMIUS RECESSION RIGHT;  Surgeon: Newt Minion, MD;  Location: [place];  Service: Orthopedics;  Laterality: Right;  . TUBAL LIGATION      There were no vitals filed for this visit.    Subjective Assessment - 01/02/20 0936    Subjective Pt. noting improved shoulder comfort today.    Pertinent History neck and back pain for many years; spondylosis; plantar fasciitis and Achilles tendonitis; previous Lt foot surgery ~ 3 yrs ago; gall bladder removed 1989; arthritis; asthma; HTN; PVD s/p aorto bifem BPG    Diagnostic tests 09/08/19 Shoulder MRI: 1. Severe tendinosis of supraspinatus tendon with apartial-thickness bursal surface tear anteriorly.2. Mild tendinosis of the infraspinatus tendon.3. Mild subacromial/subdeltoid bursitis.  Cervical: 1. Cervical spine spondylosis as described above. 2. No acute osseous injury of the cervical spine.    Patient Stated Goals "to be able to use shoulder more including working out with weights"    Currently in Pain? No/denies    Pain Score 0-No pain    Pain Location Shoulder    Pain Orientation Right;Anterior    Pain Descriptors / Indicators Aching    Pain Type Chronic pain                             [facility] Adult PT Treatment/Exercise - 01/02/20 0001      Self-Care   Self-Care Other Self-Care Comments;Posture    Posture discussion of desk posture to ensure reduced cervical/shoulder/back strain - pt. noting good overall desk setup however did review need  for "Rule of 90s" to promote upright posture       Neck Exercises: Seated   Money 3 secs;10 reps    Money Limitations leaning on doorframe     Other Seated Exercise Seated scapular retraction 5" x 10    Other Seated Exercise seated thoracic extension 5" x 10      Shoulder Exercises: Pulleys   Flexion 3 minutes    Flexion Limitations well tolerated     Scaption 3 minutes    Scaption Limitations well tolerated       Shoulder Exercises: Stretch   Corner Stretch 3 reps;30 seconds    Corner Stretch Limitations low and mid positions single arm each time x 30 sec       Manual Therapy   Manual Therapy Passive ROM    Manual therapy comments standing leaning on doorframe      Passive ROM Manual B pec stretch with pt. leaning on doorframe and therapist overpressure with hands at shoulders gently pushing pt. into pec stretch 2 x 20 sec                   PT Education - 01/02/20 1034    Education Details HEP update; seated thoracic extension    Person(s) Educated Patient    Methods Explanation;Demonstration;Verbal cues;Handout    Comprehension Verbalized understanding;Returned demonstration;Verbal cues required            PT Short Term Goals - 01/02/20 0945      PT SHORT TERM GOAL #1   Title Patient will be independent with initial HEP    Status On-going    Target Date 01/21/20      PT SHORT TERM GOAL #2   Title Patient will verbalize/demonstrate good awareness of neutral spine posture and proper body mechanics for daily tasks    Status On-going    Target Date 01/21/20             PT Long Term Goals - 01/02/20 0945      PT LONG TERM GOAL #1   Title Improve posture and alignment with patient to demonstrate improved upright posture with posterior shoulder girdle engaged    Status On-going      PT LONG TERM GOAL #2   Title Decrease pain in the Rt shoulder girdle by 50-75% allowing patient to use Rt UE for functional activities    Status On-going      PT LONG TERM GOAL #3   Title Patient to improve Rt shoulder AROM to [facility] without pain provocation    Status On-going      PT LONG TERM GOAL #4   Title Patient to improve cervical  AROM to WFL/WNL without pain provocation to allow adequate viewing of computer monitors for work w/o pain interference    Status On-going      PT LONG TERM GOAL #5   Title Patient will demonstrate improved R strength to >/= 4+/5 for functional UE use    Status On-going      PT LONG TERM GOAL #6   Title Patient will be independent with ongoing/advanced HEP    Status On-going                 Plan - 01/02/20 0945    Clinical Impression Statement [name] reports she was unable to try HEP due to  difficulty with an asthma flare-up over the past few days.  Session focused on demonstration of HEP activities to check for overall understanding and tolerance.  Pt. able to tolerate all activities from initial HEP well without increased pain however noted the shoulder has been pain free today.  Added one new activity to HEP via handout to promote increased thoracic extension with pt. verbalizing understanding.  Pt. verbalizing plans to get to HEP daily between now and next scheduled appointment near end of month.    Comorbidities neck and back pain for many years; spondylosis; plantar fasciitis and Achilles tendonitis; previous Lt foot surgery ~ 3 yrs ago; gall bladder removed 1989; arthritis; asthma; HTN; PVD s/p aorto bifem BPG    Rehab Potential Good    PT Frequency 2x / week    PT Duration 8 weeks    PT Treatment/Interventions ADLs/Self Care Home Management;Cryotherapy;Electrical Stimulation;Iontophoresis 81m/ml Dexamethasone;Moist Heat;Traction;Ultrasound;Functional mobility training;Therapeutic activities;Therapeutic exercise;Neuromuscular re-education;Patient/family education;Manual techniques;Passive range of motion;Dry needling;Taping;Vasopneumatic Device;Spinal Manipulations;Joint Manipulations    PT Next Visit Plan Posture and body mechanics education; postural stretching & strengthening; manual work to R shoulder girdle and neck with possible DN;    PT Home Exercise Plan Access Code: [email or web address]; 11/18 - seated thoracic extension    Consulted and Agree with Plan of Care Patient           Patient will benefit from skilled therapeutic intervention in order to improve the following deficits and impairments:  Decreased activity tolerance, Decreased range of motion, Decreased strength, Hypomobility, Increased fascial restricitons, Increased muscle spasms, Impaired perceived functional ability, Impaired flexibility, Impaired UE functional use, Improper body mechanics, Postural dysfunction,  Pain  Visit Diagnosis: Chronic right shoulder pain  Cervicalgia  Abnormal posture  Other symptoms and signs involving the musculoskeletal system  Muscle weakness (generalized)     Problem List Patient Active Problem List   Diagnosis Date Noted  . Acute non-recurrent frontal sinusitis 12/24/2019  . Cellulitis 10/31/2019  . Vaginal candidiasis 10/31/2019  . Primary insomnia 10/31/2019  . Abnormality of right breast on screening mammogram 10/14/2019  . DDD (degenerative disc disease), cervical 08/30/2019  . Nonalcoholic steatohepatitis (NASH) 08/08/2019  . Chronic bronchitis with productive mucopurulent cough ([place] 08/08/2019  . Postmenopausal bleeding 07/17/2019  . Moderate persistent asthma with acute bronchitis and acute exacerbation 06/27/2019  . Patellofemoral syndrome, right 06/19/2019  . Chronic right shoulder pain 05/07/2019  . Achilles tendon contracture, right   . Migraine with aura and without status migrainosus, not intractable 05/31/2017  . Plantar fasciitis of right foot 04/12/2017  . Asthma 04/12/2017  . SOB (shortness of breath) 07/12/2016  . Plantar fasciitis, left 11/17/2015  . Fatigue 11/17/2015  . 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  . Type 2 diabetes mellitus with vascular disease ([place] 01/14/2015  . Vitamin D deficiency 01/14/2015  . Morbid obesity ([place] 01/13/2015  . Fatty liver disease, nonalcoholic 068/01/7516 . Peripheral vascular disease ([place] 07/15/2011  . Depression, major, single episode, moderate ([place] 08/13/2008  . Allergic rhinitis 04/10/2008  . Hypothyroidism 08/04/2006  . Dyslipidemia 08/04/2006  . Essential hypertension 08/04/2006    MBess Harvest PTA 01/02/20 10:35 AM   [facility] [place] [address]Phone: [phone]  Fax:  [phone] Name: Katherine ROTERTMRN: [id number] of Birth: 223-Apr-1962

## 2020-01-13 ENCOUNTER — Encounter: Payer: Self-pay | Admitting: *Deleted

## 2020-01-14 ENCOUNTER — Ambulatory Visit: Payer: 59

## 2020-01-14 ENCOUNTER — Other Ambulatory Visit: Payer: Self-pay

## 2020-01-14 DIAGNOSIS — G8929 Other chronic pain: Secondary | ICD-10-CM

## 2020-01-14 DIAGNOSIS — R293 Abnormal posture: Secondary | ICD-10-CM

## 2020-01-14 DIAGNOSIS — M6281 Muscle weakness (generalized): Secondary | ICD-10-CM

## 2020-01-14 DIAGNOSIS — M25511 Pain in right shoulder: Secondary | ICD-10-CM

## 2020-01-14 DIAGNOSIS — M542 Cervicalgia: Secondary | ICD-10-CM

## 2020-01-14 DIAGNOSIS — R29898 Other symptoms and signs involving the musculoskeletal system: Secondary | ICD-10-CM

## 2020-01-14 NOTE — Therapy (Addendum)
[facility] [address]  [place], [place], [address] Phone: [phone]   Fax:  [phone]  Physical Therapy Treatment  Patient Details  Name: [name] MRN: [id number] Date of Birth: [birth year] Referring Provider (PT): Basil Dess, MD   Encounter Date: 01/14/2020   PT End of Session - 01/14/20 1654    Visit Number 3    Number of Visits 16    Date for PT Re-Evaluation 02/25/20    Authorization Type [employer] - VL: 35 (PT,OT, ST combined)    PT Start Time 0938    PT Stop Time 1725    PT Time Calculation (min) 39 min    Activity Tolerance Patient tolerated treatment well    Behavior During Therapy [facility] for tasks assessed/performed           Past Medical History:  Diagnosis Date   ALLERGIC RHINITIS 04/10/2008   Asthma    asthmatic bronchitis   Breast lump in female    Carpal tunnel syndrome of right wrist    DEPRESSION 08/13/2008   Diabetes mellitus without complication ([place])    Hepatitis    fatty liver   HYPERLIPIDEMIA 08/04/2006   HYPERTENSION 08/04/2006   HYPOTHYROIDISM 08/04/2006   Kidney injury    LOW BACK PAIN 08/04/2006   Neuromuscular disorder ([place])    PERIMENOPAUSAL SYNDROME 05/08/2008   PVD (peripheral vascular disease) ([place])    occlusive, status post bifem bypass 2007    Past Surgical History:  Procedure Laterality Date   CARPAL TUNNEL RELEASE Right 04/05/2016   Procedure: OPEN RIGHT CARPAL TUNNEL RELEASE;  Surgeon: Jessy Oto, MD;  Location: [facility];  Service: Orthopedics;  Laterality: Right;   CHOLECYSTECTOMY     FEMORAL BYPASS     bifem.   HAGLAND'S DEFORMITY EXCISION     PLANTAR FASCIA RELEASE Right 04/18/2018   Procedure: PLANTAR FASCIA RELEASE AND GASTROCNEMIUS RECESSION RIGHT;  Surgeon: Newt Minion, MD;  Location: [place];  Service: Orthopedics;  Laterality: Right;   TUBAL LIGATION      There were no vitals filed for this visit.    Subjective Assessment - 01/14/20 1651    Subjective Pt. noting she has not been performing HEP however shoulder has been feeling better.    Pertinent History neck and back pain for many years; spondylosis; plantar fasciitis and Achilles tendonitis; previous Lt foot surgery ~ 3 yrs ago; gall bladder removed 1989; arthritis; asthma; HTN; PVD s/p aorto bifem BPG    Diagnostic tests 09/08/19 Shoulder MRI: 1. Severe tendinosis of supraspinatus tendon with apartial-thickness bursal surface tear anteriorly.2. Mild tendinosis of the infraspinatus tendon.3. Mild subacromial/subdeltoid bursitis.  Cervical: 1. Cervical spine spondylosis as described above. 2. No acute osseous injury of the cervical spine.    Patient Stated Goals "to be able to use shoulder more including working out with weights"    Currently in Pain? No/denies    Pain Score 0-No pain    Pain Location Shoulder    Pain Orientation Right;Anterior    Pain Descriptors / Indicators Aching    Pain Type Chronic pain    Multiple Pain Sites No              [facility] PT Assessment - 01/14/20 0001      AROM   Right/Left Shoulder Right;Left    Right Shoulder Flexion 148 Degrees    Right Shoulder ABduction 160 Degrees    Right Shoulder Internal Rotation --  FIR to T10   Right Shoulder External Rotation --   FER to T2   Left Shoulder Flexion 145 Degrees    Left Shoulder ABduction 160 Degrees    Left Shoulder Internal Rotation --   FIR to T9   Left Shoulder External Rotation --   FER to T2   Cervical Flexion 50    Cervical Extension 52    Cervical - Right Side Bend 26    Cervical - Left Side Bend 26    Cervical - Right Rotation 56    Cervical - Left Rotation 52                         [facility] Adult PT Treatment/Exercise - 01/14/20 0001      Neck Exercises: Theraband   Shoulder Extension 10 reps;Red    Shoulder Extension Limitations cues for scapular retraction     Rows 15 reps;Red    Rows Limitations cues for scapular  depression       Shoulder Exercises: Pulleys   Flexion 3 minutes    Flexion Limitations well tolerated     Scaption 3 minutes    Scaption Limitations well tolerated       Shoulder Exercises: ROM/Strengthening   Cybex Row 15 reps    Cybex Row Limitations 15# - low      Shoulder Exercises: Stretch   Corner Stretch 3 reps;30 seconds    Corner Stretch Limitations low and mid positions single arm each time x 30 sec                   PT Education - 01/14/20 1802    Education Details HEP update;  row, extension red TB    Person(s) Educated Patient    Methods Explanation;Demonstration;Verbal cues;Handout    Comprehension Verbalized understanding;Returned demonstration;Verbal cues required            PT Short Term Goals - 01/14/20 1739      PT SHORT TERM GOAL #1   Title Patient will be independent with initial HEP    Status Achieved    Target Date 01/21/20      PT SHORT TERM GOAL #2   Title Patient will verbalize/demonstrate good awareness of neutral spine posture and proper body mechanics for daily tasks    Status Achieved    Target Date 01/21/20             PT Long Term Goals - 01/02/20 0945      PT LONG TERM GOAL #1   Title Improve posture and alignment with patient to demonstrate improved upright posture with posterior shoulder girdle engaged    Status On-going      PT LONG TERM GOAL #2   Title Decrease pain in the Rt shoulder girdle by 50-75% allowing patient to use Rt UE for functional activities    Status On-going      PT LONG TERM GOAL #3   Title Patient to improve Rt shoulder AROM to [facility] without pain provocation    Status On-going      PT LONG TERM GOAL #4   Title Patient to improve cervical  AROM to WFL/WNL without pain provocation to allow adequate viewing of computer monitors for work w/o pain interference    Status On-going      PT LONG TERM GOAL #5   Title Patient will demonstrate improved R strength to >/= 4+/5 for functional UE use     Status On-going  PT LONG TERM GOAL #6   Title Patient will be independent with ongoing/advanced HEP    Status On-going                 Plan - 01/14/20 1704    Clinical Impression Statement Pt. noting 50% improvement in pain and function since starting therapy.  Able to demonstrate significant improvement in cervical B side bending, rotation, extension AROM today.  Progressed postural strengthening activities and HEP updated accordingly without increased pain.  Pt. noting much improved awareness of neutral spinal posture and body mechanics since starting therapy avoiding forward slumped position when on phone and has adjusted her desk stet up for improved cervical support.  STG #2 met.  Denies questions with initial HEP.  STG #1 met.    Comorbidities neck and back pain for many years; spondylosis; plantar fasciitis and Achilles tendonitis; previous Lt foot surgery ~ 3 yrs ago; gall bladder removed 1989; arthritis; asthma; HTN; PVD s/p aorto bifem BPG    Rehab Potential Good    PT Frequency 2x / week    PT Duration 8 weeks    PT Treatment/Interventions ADLs/Self Care Home Management;Cryotherapy;Electrical Stimulation;Iontophoresis 82m/ml Dexamethasone;Moist Heat;Traction;Ultrasound;Functional mobility training;Therapeutic activities;Therapeutic exercise;Neuromuscular re-education;Patient/family education;Manual techniques;Passive range of motion;Dry needling;Taping;Vasopneumatic Device;Spinal Manipulations;Joint Manipulations    PT Next Visit Plan Postural stretching & strengthening; manual work to R shoulder girdle and neck with possible DN;    PT Home Exercise Plan Access Code: [email or web address]; 11/18 - seated thoracic extension    Consulted and Agree with Plan of Care Patient           Patient will benefit from skilled therapeutic intervention in order to improve the following deficits and impairments:  Decreased activity tolerance, Decreased range of motion, Decreased strength,  Hypomobility, Increased fascial restricitons, Increased muscle spasms, Impaired perceived functional ability, Impaired flexibility, Impaired UE functional use, Improper body mechanics, Postural dysfunction, Pain  Visit Diagnosis: Chronic right shoulder pain  Cervicalgia  Abnormal posture  Other symptoms and signs involving the musculoskeletal system  Muscle weakness (generalized)  Acute pain of right shoulder     Problem List Patient Active Problem List   Diagnosis Date Noted   Acute non-recurrent frontal sinusitis 12/24/2019   Cellulitis 10/31/2019   Vaginal candidiasis 10/31/2019   Primary insomnia 10/31/2019   Abnormality of right breast on screening mammogram 10/14/2019   DDD (degenerative disc disease), cervical 021/19/4174  Nonalcoholic steatohepatitis (NASH) 08/08/2019   Chronic bronchitis with productive mucopurulent cough ([place] 08/08/2019   Postmenopausal bleeding 07/17/2019   Moderate persistent asthma with acute bronchitis and acute exacerbation 06/27/2019   Patellofemoral syndrome, right 06/19/2019   Chronic right shoulder pain 05/07/2019   Achilles tendon contracture, right    Migraine with aura and without status migrainosus, not intractable 05/31/2017   Plantar fasciitis of right foot 04/12/2017   Asthma 04/12/2017   SOB (shortness of breath) 07/12/2016   Plantar fasciitis, left 11/17/2015   Fatigue 11/17/2015   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   Type 2 diabetes mellitus with vascular disease ([place] 01/14/2015   Vitamin D deficiency 01/14/2015   Morbid obesity ([place] 01/13/2015   Fatty liver disease, nonalcoholic 008/14/4818  Peripheral vascular disease ([place] 07/15/2011   Depression, major, single episode, moderate ([place] 08/13/2008   Allergic rhinitis 04/10/2008   Hypothyroidism 08/04/2006   Dyslipidemia 08/04/2006   Essential hypertension 08/04/2006    MBess Harvest PTA 01/14/20 6:03 PM   [facility] [address]  [place], [place], [address] Phone: [phone]   Fax:  [phone]  Name: ISHANA BLADES MRN: [id number] Date of Birth: 1960/08/20

## 2020-01-16 ENCOUNTER — Ambulatory Visit: Payer: 59 | Admitting: Physical Therapy

## 2020-01-16 ENCOUNTER — Telehealth (INDEPENDENT_AMBULATORY_CARE_PROVIDER_SITE_OTHER): Payer: 59 | Admitting: Nurse Practitioner

## 2020-01-16 ENCOUNTER — Encounter: Payer: Self-pay | Admitting: Nurse Practitioner

## 2020-01-16 DIAGNOSIS — J0101 Acute recurrent maxillary sinusitis: Secondary | ICD-10-CM

## 2020-01-16 IMAGING — DX LUMBAR SPINE - COMPLETE 4+ VIEW
5 series · 5 of 5 positions shown · non-contrast
Comparison: None

CTA aortobifem 11/29/2004

CLINICAL DATA: Chronic low back pain with RIGHT radiculopathy

EXAM:
LUMBAR SPINE - COMPLETE 4+ VIEW

[l-spine ap]
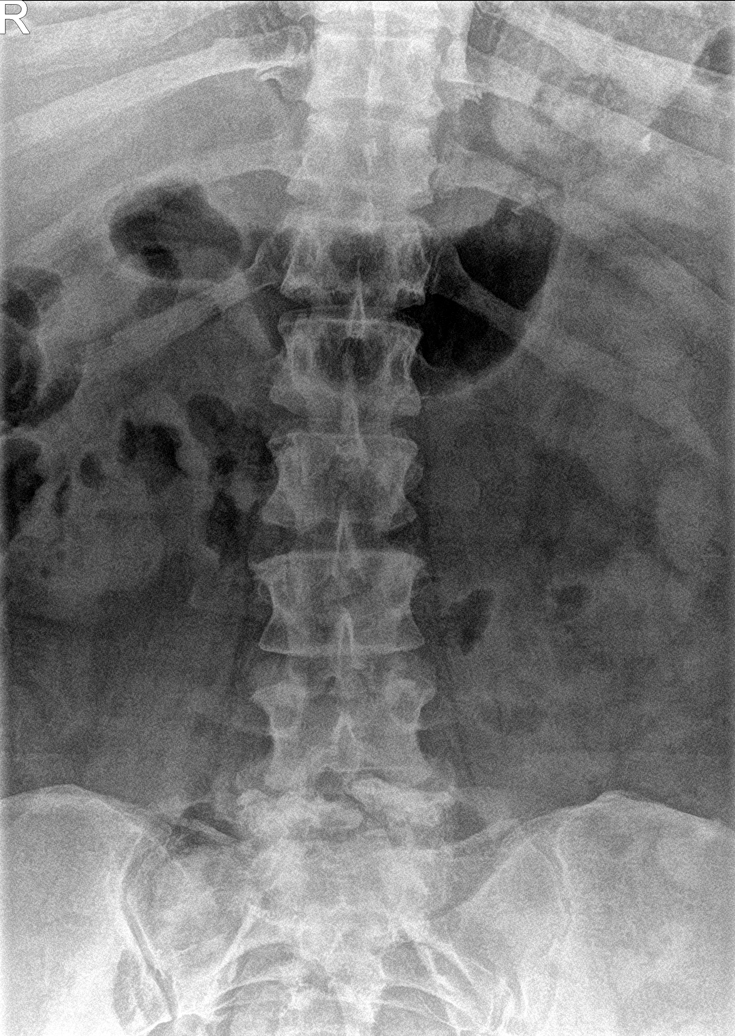

[l-spine obl (1 of 2)]
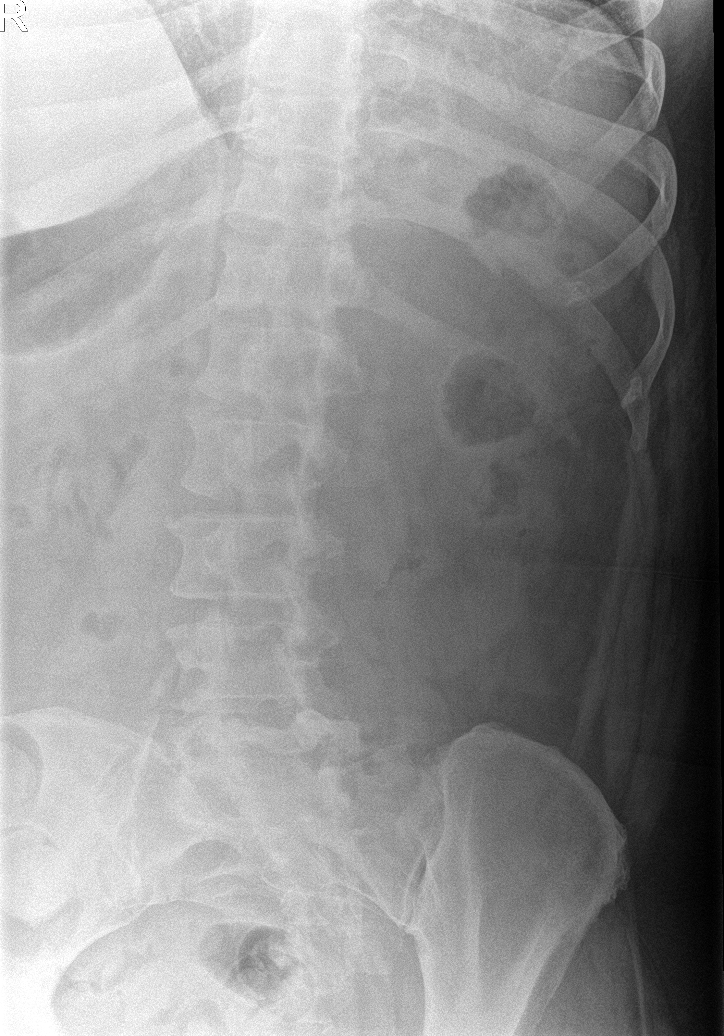

[l-spine obl (2 of 2)]
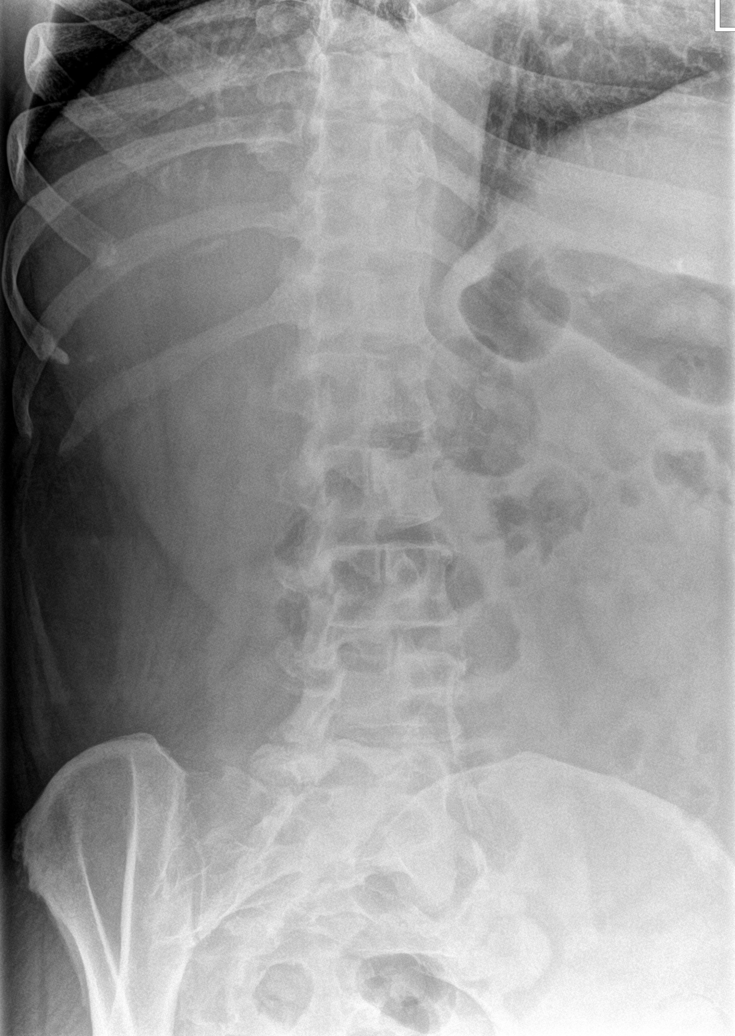

[l-spine lat]
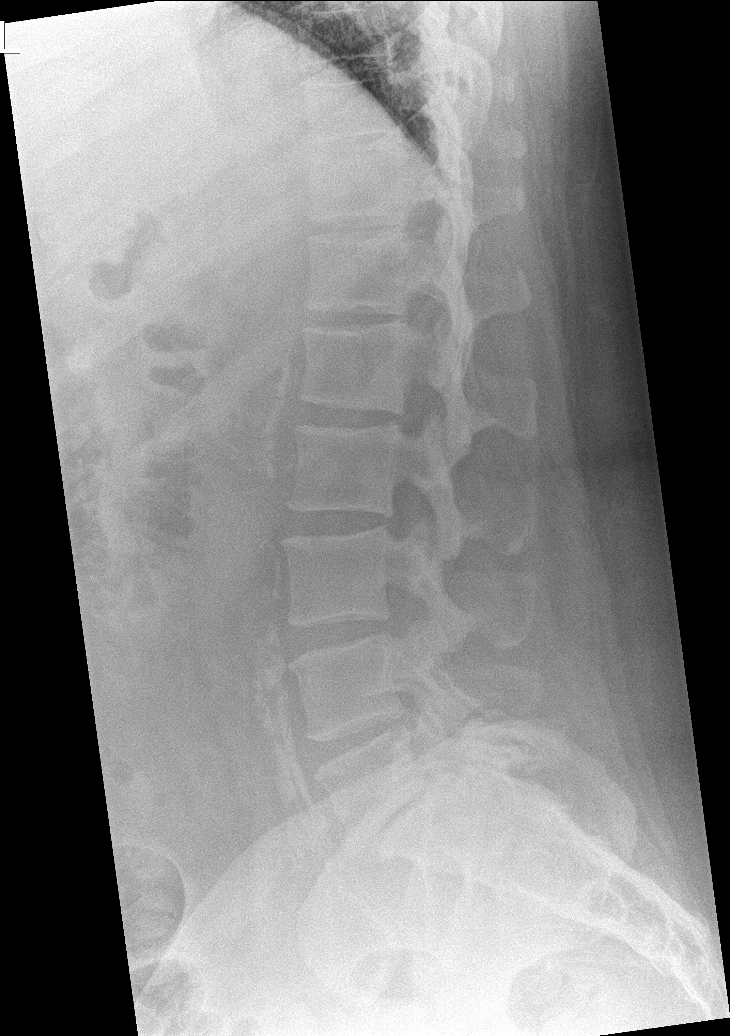

[l-spine spot]
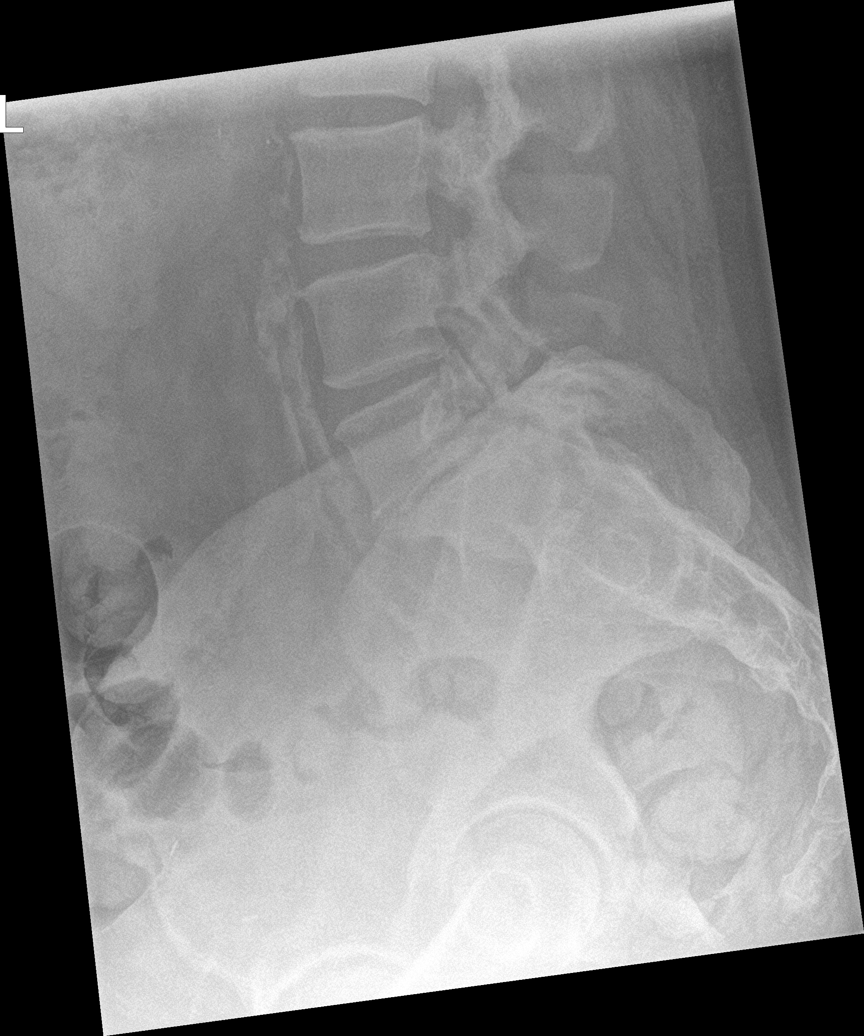

[5 of 5 positions shown; findings below may reference images not displayed]

FINDINGS: Five non-rib-bearing lumbar vertebra.

Spina bifida occulta L5.

Grade 1 spondylolisthesis L5-S1 due to BILATERAL spondylolysis L5.

Vertebral body heights maintained.

Disc space narrowing L4-L5 and L5-S1.

No fracture, additional subluxation, or bone destruction.

Atherosclerotic calcifications aorta and iliac arteries.

SI joints preserved.
IMPRESSION: Degenerative disc disease changes L4-L5 and L5-S1.

Grade 1 spondylolisthesis L5-S1 secondary to BILATERAL spondylolysis
L5.

Spina bifida occulta L5.

## 2020-01-16 MED ORDER — AMOXICILLIN-POT CLAVULANATE 875-125 MG PO TABS: 1.0000 | ORAL_TABLET | Freq: Two times a day (BID) | ORAL | 0 refills | Status: DC

## 2020-01-16 MED ORDER — FLUCONAZOLE 150 MG PO TABS: 150.0000 mg | ORAL_TABLET | Freq: Every day | ORAL | 1 refills | Status: DC

## 2020-01-16 MED ORDER — DESLORATADINE 5 MG PO TABS
5.0000 mg | ORAL_TABLET | Freq: Every day | ORAL | 11 refills | Status: DC
  Filled 2021-01-03: qty 30, 30d supply, fill #0

## 2020-01-16 NOTE — Progress Notes (Signed)
Virtual Video Visit via MyChart Note  I connected with  [name] on 01/16/20 at  9:30 AM EST by the video enabled telemedicine application for , MyChart, and verified that I am speaking with the correct person using two identifiers.   I introduced myself as a Designer, jewellery with the practice. We discussed the limitations of evaluation and management by telemedicine and the availability of in person appointments. The patient expressed understanding and agreed to proceed.  Participating parties in this visit include: The patient and nurse practitioner listed only The patient is: at home I am: in the office  Subjective:    CC:  Chief Complaint  Patient presents with  . Sinusitis    finished atbx and states she did get some better but has been blowing "bloody clots" for the last week, still has sinus pain/pressure    HPI: [name] is a 59 y.o. y/o female presenting via [place] today for ongoing sinus pain and pressure.   She was last seen 12/24/2019 for sinusitis and exacerbation of COPD due to suspected infection. She was treated with levaquin for 7 days. She tells me that her cough and shortness of breath have improved and that her sinus symptoms improved initially then returned.  She endorses sinus pain and pressure on the right side greater than the left, bloody/green nasal discharge, post nasal drip, sore throat, and headache.   She denies fever, cough, shortness of breath, chest pain.   She tells me she is not sleeping well due to the drainage present.    Past medical history, Surgical history, Family history not pertinant except as noted below, Social history, Allergies, and medications have been entered into the medical record, reviewed, and corrections made.   Review of Systems:  All review of systems negative except what is listed in the HPI   Objective:    General: Speaking clearly in complete sentences without any shortness of breath.   Alert and oriented  x3.   Normal judgment.  No apparent acute distress. Audibly congested.   Impression and Recommendations:    1. Acute recurrent maxillary sinusitis Symptoms and presentation consistent with acute recurrent sinusitis. She has historically had to have prolonged antibiotic use due to prolonged infection. It is unclear if her infection completely resolved with the intial treatment and a new infection is present or if this is a separate infection.  Would like to avoid prolonged course of floroquinolones, therefore will begin treatment today with Augmentin and prolong treatment for 10 day course. Diflucan provided for vaginal yeast infection often accompanied with antibiotic use for patient.  Will also restart daily clarinex, as this has been helpful for chronic rhinitis in the past.  Recommend continued use of azelastine nasal spray.  Recommend mucinex and short course (4 or less days) of Afrin nasal spray at bedtime to help with nighttime symptoms.  Recommend increased rest and fluids- I do feel that her decreased sleep may be affecting her ability to fully recover.  Follow-up if symptoms are not improving with antibiotic use.   - amoxicillin-clavulanate (AUGMENTIN) 875-125 MG tablet; Take 1 tablet by mouth 2 (two) times daily.  Dispense: 20 tablet; Refill: 0 - fluconazole (DIFLUCAN) 150 MG tablet; Take 1 tablet (150 mg total) by mouth daily.  Dispense: 1 tablet; Refill: 1 - desloratadine (CLARINEX) 5 MG tablet; Take 1 tablet (5 mg total) by mouth daily.  Dispense: 30 tablet; Refill: 11      I discussed the assessment and treatment plan  with the patient. The patient was provided an opportunity to ask questions and all were answered. The patient agreed with the plan and demonstrated an understanding of the instructions.   The patient was advised to call back or seek an in-person evaluation if the symptoms worsen or if the condition fails to improve as anticipated.  I provided 20 minutes of  non-face-to-face interaction with this [place] visit including intake, same-day documentation, and chart review.   Orma Render, NP

## 2020-01-16 NOTE — Patient Instructions (Addendum)
I have called in Augmentin for you to take for 10 days. I also called in a diflucan for yeast.   Let's try Mucinex (can be the decongestant version, if you would like) during the day and then start the Clarinex at bedtime.  You can also use Afrin for the next three or four days at bedtime to see if we can get some of these symptoms to go down at night. The Afrin can cause rebound congestion if used for too long so stop after 4 days.  Keep using the azelastine, but you can stop the Flonase while using the Afrin.   Increase your rest and hydration. I really want you to rest as much as possible to get you feeling better. Even if you don't sleep, laying down and resting is good for your body.  Touch base with me on Monday and let me know if your symptoms are improving.   I hope you feel better soon!

## 2020-01-21 ENCOUNTER — Encounter: Payer: Self-pay | Admitting: Nurse Practitioner

## 2020-01-22 ENCOUNTER — Other Ambulatory Visit: Payer: Self-pay

## 2020-01-22 ENCOUNTER — Ambulatory Visit: Payer: 59 | Attending: Obstetrics & Gynecology

## 2020-01-22 DIAGNOSIS — R293 Abnormal posture: Secondary | ICD-10-CM | POA: Diagnosis present

## 2020-01-22 DIAGNOSIS — M6281 Muscle weakness (generalized): Secondary | ICD-10-CM | POA: Diagnosis present

## 2020-01-22 DIAGNOSIS — G8929 Other chronic pain: Secondary | ICD-10-CM | POA: Diagnosis present

## 2020-01-22 DIAGNOSIS — M542 Cervicalgia: Secondary | ICD-10-CM | POA: Insufficient documentation

## 2020-01-22 DIAGNOSIS — M25511 Pain in right shoulder: Secondary | ICD-10-CM | POA: Diagnosis present

## 2020-01-22 DIAGNOSIS — R29898 Other symptoms and signs involving the musculoskeletal system: Secondary | ICD-10-CM | POA: Insufficient documentation

## 2020-01-22 NOTE — Therapy (Signed)
[facility] [place] [address]  [place], [place], [address] Phone: [phone]   Fax:  [phone]  Physical Therapy Treatment  Patient Details  Name: [name] MRN: [id number] Date of Birth: 01/16/1961 Referring Provider (PT): Basil Dess, MD   Encounter Date: 01/22/2020   PT End of Session - 01/22/20 1706    Visit Number 4    Number of Visits 16    Date for PT Re-Evaluation 02/25/20    Authorization Type [employer] - VL: 20 (PT,OT, ST combined)    PT Start Time 2876    PT Stop Time 1742    PT Time Calculation (min) 44 min    Activity Tolerance Patient tolerated treatment well    Behavior During Therapy [facility] for tasks assessed/performed           Past Medical History:  Diagnosis Date  . ALLERGIC RHINITIS 04/10/2008  . Asthma    asthmatic bronchitis  . Breast lump in female   . Carpal tunnel syndrome of right wrist   . DEPRESSION 08/13/2008  . Diabetes mellitus without complication ([place])   . Hepatitis    fatty liver  . HYPERLIPIDEMIA 08/04/2006  . HYPERTENSION 08/04/2006  . HYPOTHYROIDISM 08/04/2006  . Kidney injury   . LOW BACK PAIN 08/04/2006  . Neuromuscular disorder ([place])   . PERIMENOPAUSAL SYNDROME 05/08/2008  . PVD (peripheral vascular disease) ([place])    occlusive, status post bifem bypass 2007    Past Surgical History:  Procedure Laterality Date  . CARPAL TUNNEL RELEASE Right 04/05/2016   Procedure: OPEN RIGHT CARPAL TUNNEL RELEASE;  Surgeon: Jessy Oto, MD;  Location: [place] [facility];  Service: Orthopedics;  Laterality: Right;  . CHOLECYSTECTOMY    . FEMORAL BYPASS     bifem.  Marland Kitchen HAGLAND'S DEFORMITY EXCISION    . PLANTAR FASCIA RELEASE Right 04/18/2018   Procedure: PLANTAR FASCIA RELEASE AND GASTROCNEMIUS RECESSION RIGHT;  Surgeon: Newt Minion, MD;  Location: [place];  Service: Orthopedics;  Laterality: Right;  . TUBAL LIGATION      There were no vitals filed for this visit.   Subjective  Assessment - 01/22/20 1703    Subjective Pt. feels that she is moving much better.    Pertinent History neck and back pain for many years; spondylosis; plantar fasciitis and Achilles tendonitis; previous Lt foot surgery ~ 3 yrs ago; gall bladder removed 1989; arthritis; asthma; HTN; PVD s/p aorto bifem BPG    Diagnostic tests 09/08/19 Shoulder MRI: 1. Severe tendinosis of supraspinatus tendon with apartial-thickness bursal surface tear anteriorly.2. Mild tendinosis of the infraspinatus tendon.3. Mild subacromial/subdeltoid bursitis.  Cervical: 1. Cervical spine spondylosis as described above. 2. No acute osseous injury of the cervical spine.    Patient Stated Goals "to be able to use shoulder more including working out with weights"    Currently in Pain? Yes    Pain Score 0-No pain   short-lasting 6-7/10 pain on R-sided neck while turning head changing lanes   Pain Location Shoulder    Pain Orientation Right    Pain Descriptors / Indicators Aching    Pain Type Chronic pain    Pain Frequency Intermittent    Aggravating Factors  while changing lanes    Multiple Pain Sites No                             [facility] Adult PT Treatment/Exercise - 01/22/20 0001  Neck Exercises: Theraband   Shoulder External Rotation 10 reps;Red   cues for scapular retraction    Shoulder External Rotation Limitations Leaning on doorseal     Horizontal ABduction 15 reps    Horizontal ABduction Limitations Seated       Neck Exercises: Seated   Other Seated Exercise seated thoracic extension 5" x 15      Shoulder Exercises: Pulleys   Flexion 3 minutes    Scaption 3 minutes      Shoulder Exercises: ROM/Strengthening   UBE (Upper Arm Bike) Lvl 1.5, 3 min forwards, 3 min backwards     Cybex Row 10 reps    Cybex Row Limitations 25# - low      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;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    Corner Stretch Limitations low B x 30, B single arm mid position                     PT Short Term Goals - 01/14/20 1739      PT SHORT TERM GOAL #1   Title Patient will be independent with initial HEP    Status Achieved    Target Date 01/21/20      PT SHORT TERM GOAL #2   Title Patient will verbalize/demonstrate good awareness of neutral spine posture and proper body mechanics for daily tasks    Status Achieved    Target Date 01/21/20             PT Long Term Goals - 01/02/20 0945      PT LONG TERM GOAL #1   Title Improve posture and alignment with patient to demonstrate improved upright posture with posterior shoulder girdle engaged    Status On-going      PT LONG TERM GOAL #2   Title Decrease pain in the Rt shoulder girdle by 50-75% allowing patient to use Rt UE for functional activities    Status On-going      PT LONG TERM GOAL #3   Title Patient to improve Rt shoulder AROM to [facility] without pain provocation    Status On-going      PT LONG TERM GOAL #4   Title Patient to improve cervical  AROM to WFL/WNL without pain provocation to allow adequate viewing of computer monitors for work w/o pain interference    Status On-going      PT LONG TERM GOAL #5   Title Patient will demonstrate improved R strength to >/= 4+/5 for functional UE use    Status On-going      PT LONG TERM GOAL #6   Title Patient will be independent with ongoing/advanced HEP    Status On-going                 Plan - 01/22/20 1708    Clinical Impression Statement Pt. continues to note improved R shoulder and neck pain despite admitting to very poor HEP performance.  Did note R-sided neck tightness after turning to change lanes in car earlier today which was relieved by neck stretching.  Progressed periscapular strengthening activities without pain today.  Progressed reps with 3-way chest stretching for improved posture and reduced cervical strain.  [name] seems to be progressing well with therapy.  Deferred further HEP performance as pt. with poor adherence to  existing HEP which she connects to busy deadlines at work.    Comorbidities neck and back pain for many years; spondylosis; plantar fasciitis and Achilles tendonitis; previous Lt foot surgery ~  3 yrs ago; gall bladder removed 1989; arthritis; asthma; HTN; PVD s/p aorto bifem BPG    Rehab Potential Good    PT Frequency 2x / week    PT Duration 8 weeks    PT Treatment/Interventions ADLs/Self Care Home Management;Cryotherapy;Electrical Stimulation;Iontophoresis 32m/ml Dexamethasone;Moist Heat;Traction;Ultrasound;Functional mobility training;Therapeutic activities;Therapeutic exercise;Neuromuscular re-education;Patient/family education;Manual techniques;Passive range of motion;Dry needling;Taping;Vasopneumatic Device;Spinal Manipulations;Joint Manipulations    PT Next Visit Plan Postural stretching & strengthening; manual work to R shoulder girdle and neck with possible DN;    PT Home Exercise Plan Access Code: [email or web address]; 11/18 - seated thoracic extension    Consulted and Agree with Plan of Care Patient           Patient will benefit from skilled therapeutic intervention in order to improve the following deficits and impairments:  Decreased activity tolerance, Decreased range of motion, Decreased strength, Hypomobility, Increased fascial restricitons, Increased muscle spasms, Impaired perceived functional ability, Impaired flexibility, Impaired UE functional use, Improper body mechanics, Postural dysfunction, Pain  Visit Diagnosis: Chronic right shoulder pain  Cervicalgia  Abnormal posture  Other symptoms and signs involving the musculoskeletal system  Muscle weakness (generalized)  Acute pain of right shoulder     Problem List Patient Active Problem List   Diagnosis Date Noted  . Acute non-recurrent frontal sinusitis 12/24/2019  . Cellulitis 10/31/2019  . Vaginal candidiasis 10/31/2019  . Primary insomnia 10/31/2019  . Abnormality of right breast on screening mammogram 10/14/2019   . DDD (degenerative disc disease), cervical 08/30/2019  . Nonalcoholic steatohepatitis ([facility]) 08/08/2019  . Chronic bronchitis with productive mucopurulent cough ([place] 08/08/2019  . Postmenopausal bleeding 07/17/2019  . Moderate persistent asthma with acute bronchitis and acute exacerbation 06/27/2019  . Patellofemoral syndrome, right 06/19/2019  . Chronic right shoulder pain 05/07/2019  . Achilles tendon contracture, right   . Migraine with aura and without status migrainosus, not intractable 05/31/2017  . Plantar fasciitis of right foot 04/12/2017  . Asthma 04/12/2017  . SOB (shortness of breath) 07/12/2016  . Plantar fasciitis, left 11/17/2015  . Fatigue 11/17/2015  . 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  . Type 2 diabetes mellitus with vascular disease ([place] 01/14/2015  . Vitamin D deficiency 01/14/2015  . Morbid obesity ([place] 01/13/2015  . Fatty liver disease, nonalcoholic 001/75/1025 . Peripheral vascular disease ([place] 07/15/2011  . Depression, major, single episode, moderate ([place] 08/13/2008  . Allergic rhinitis 04/10/2008  . Hypothyroidism 08/04/2006  . Dyslipidemia 08/04/2006  . Essential hypertension 08/04/2006    MBess Harvest PTA 01/22/20 5:57 PM   [facility] [place] [address]Phone: [phone]  Fax:  [phone] Name: Katherine LARKINMRN: [id number] of Birth: 217-Mar-1962

## 2020-01-28 ENCOUNTER — Other Ambulatory Visit: Payer: Self-pay

## 2020-01-28 ENCOUNTER — Encounter: Payer: Self-pay | Admitting: Physical Therapy

## 2020-01-28 ENCOUNTER — Ambulatory Visit: Payer: 59 | Admitting: Physical Therapy

## 2020-01-28 DIAGNOSIS — G8929 Other chronic pain: Secondary | ICD-10-CM

## 2020-01-28 DIAGNOSIS — M25511 Pain in right shoulder: Secondary | ICD-10-CM | POA: Diagnosis not present

## 2020-01-28 DIAGNOSIS — M542 Cervicalgia: Secondary | ICD-10-CM

## 2020-01-28 DIAGNOSIS — M6281 Muscle weakness (generalized): Secondary | ICD-10-CM

## 2020-01-28 DIAGNOSIS — R29898 Other symptoms and signs involving the musculoskeletal system: Secondary | ICD-10-CM

## 2020-01-28 DIAGNOSIS — R293 Abnormal posture: Secondary | ICD-10-CM

## 2020-01-28 NOTE — Patient Instructions (Signed)
    Home exercise program created by Kimsey Demaree, PT.  For questions, please contact Jomes Giraldo via phone at [phone] or email at [email or web address]  [facility] [place] [address]  [address] [place], [place], [address] Phone: [phone]   Fax:  [phone]    

## 2020-01-28 NOTE — Therapy (Signed)
[facility] [address]  [place], [place], [address] Phone: [phone]   Fax:  [phone]  Physical Therapy Treatment  Patient Details  Name: [name] MRN: [id number] Date of Birth: Jul 15, 1960 Referring Provider (PT): Basil Dess, MD   Encounter Date: 01/28/2020   PT End of Session - 01/28/20 1623    Visit Number 5    Number of Visits 16    Date for PT Re-Evaluation 02/25/20    Authorization Type [employer] - VL: 49 (PT,OT, ST combined)    PT Start Time 1623    PT Stop Time 1708    PT Time Calculation (min) 45 min    Activity Tolerance Patient tolerated treatment well    Behavior During Therapy [facility] for tasks assessed/performed           Past Medical History:  Diagnosis Date  . ALLERGIC RHINITIS 04/10/2008  . Asthma    asthmatic bronchitis  . Breast lump in female   . Carpal tunnel syndrome of right wrist   . DEPRESSION 08/13/2008  . Diabetes mellitus without complication ([place])   . Hepatitis    fatty liver  . HYPERLIPIDEMIA 08/04/2006  . HYPERTENSION 08/04/2006  . HYPOTHYROIDISM 08/04/2006  . Kidney injury   . LOW BACK PAIN 08/04/2006  . Neuromuscular disorder ([place])   . PERIMENOPAUSAL SYNDROME 05/08/2008  . PVD (peripheral vascular disease) ([place])    occlusive, status post bifem bypass 2007    Past Surgical History:  Procedure Laterality Date  . CARPAL TUNNEL RELEASE Right 04/05/2016   Procedure: OPEN RIGHT CARPAL TUNNEL RELEASE;  Surgeon: Jessy Oto, MD;  Location: [facility];  Service: Orthopedics;  Laterality: Right;  . CHOLECYSTECTOMY    . FEMORAL BYPASS     bifem.  Marland Kitchen HAGLAND'S DEFORMITY EXCISION    . PLANTAR FASCIA RELEASE Right 04/18/2018   Procedure: PLANTAR FASCIA RELEASE AND GASTROCNEMIUS RECESSION RIGHT;  Surgeon: Newt Minion, MD;  Location: [facility];  Service: Orthopedics;  Laterality: Right;  . TUBAL LIGATION      There were no vitals filed for this visit.    Subjective Assessment - 01/28/20 1626    Subjective Pt reports she has been basically pain free and when she does hurt it is usually in her neck at the base of her head.    Pertinent History neck and back pain for many years; spondylosis; plantar fasciitis and Achilles tendonitis; previous Lt foot surgery ~ 3 yrs ago; gall bladder removed 1989; arthritis; asthma; HTN; PVD s/p aorto bifem BPG    Diagnostic tests 09/08/19 Shoulder MRI: 1. Severe tendinosis of supraspinatus tendon with apartial-thickness bursal surface tear anteriorly.2. Mild tendinosis of the infraspinatus tendon.3. Mild subacromial/subdeltoid bursitis.  Cervical: 1. Cervical spine spondylosis as described above. 2. No acute osseous injury of the cervical spine.    Patient Stated Goals "to be able to use shoulder more including working out with weights"    Currently in Pain? No/denies              [facility] PT Assessment - 01/28/20 1623      Assessment   Medical Diagnosis R shoulder and neck pain 2 R shoulder impingement, AC joint arthritis, biceps tendonitis and incomplete RTC tear as well as cervical spondylosis    Referring Provider (PT) Basil Dess, MD    Hand Dominance Right    Next MD Visit TBD      AROM   Right Shoulder Flexion 152  Degrees    Right Shoulder ABduction 161 Degrees    Right Shoulder Internal Rotation --   FIR to T9   Right Shoulder External Rotation --   FER to T2   Left Shoulder Flexion 148 Degrees    Left Shoulder ABduction 157 Degrees    Left Shoulder Internal Rotation --   FIR to T9   Left Shoulder External Rotation --   FER to T2   Cervical Flexion 50    Cervical Extension 58    Cervical - Right Side Bend 34    Cervical - Left Side Bend 28    Cervical - Right Rotation 62    Cervical - Left Rotation 58      Strength   Right Shoulder Flexion 4/5   pain in bicep   Right Shoulder ABduction 4/5   pain in biceps   Right Shoulder Internal Rotation 4+/5    Right Shoulder External Rotation 4/5     Left Shoulder Flexion 4+/5    Left Shoulder ABduction 4+/5    Left Shoulder Internal Rotation 4+/5    Left Shoulder External Rotation 4+/5                         [facility] Adult PT Treatment/Exercise - 01/28/20 1623      Neck Exercises: Seated   Cervical Rotation Right;Left;10 reps    Cervical Rotation Limitations cervical SNAGs with rolled pillow case    Other Seated Exercise Supported cervical extension with rolled pillow case at base of skull 10 x 3"      Shoulder Exercises: ROM/Strengthening   UBE (Upper Arm Bike) L1.5 x 6 min (3' fwd/3' back)      Manual Therapy   Manual Therapy Joint mobilization;Soft tissue mobilization;Myofascial release    Joint Mobilization grade II-III cervical CPAs, UPAs and side glides    Soft tissue mobilization STM/DTM to R biceps    Myofascial Release B suboccipital release    Passive ROM Cervical PROM and MWM all motions                  PT Education - 01/28/20 1700    Education Details HEP update - supported cervical extension & rotation SNAGs, self-suboccipital release    Person(s) Educated Patient    Methods Explanation;Demonstration;Verbal cues;Handout    Comprehension Verbalized understanding;Verbal cues required;Returned demonstration;Need further instruction            PT Short Term Goals - 01/28/20 1628      PT SHORT TERM GOAL #1   Title Patient will be independent with initial HEP    Status Achieved   01/14/20     PT SHORT TERM GOAL #2   Title Patient will verbalize/demonstrate good awareness of neutral spine posture and proper body mechanics for daily tasks    Status Achieved   01/14/20            PT Long Term Goals - 01/28/20 1628      PT LONG TERM GOAL #1   Title Improve posture and alignment with patient to demonstrate improved upright posture with posterior shoulder girdle engaged    Status Achieved   01/28/20   Target Date --      PT LONG TERM GOAL #2   Title Decrease pain in the Rt shoulder  girdle by 50-75% allowing patient to use Rt UE for functional activities    Status Achieved   01/28/20 - Pt reports 99.9% improvement in pain  PT LONG TERM GOAL #3   Title Patient to improve Rt shoulder AROM to [facility] without pain provocation    Status Achieved   01/28/20     PT LONG TERM GOAL #4   Title Patient to improve cervical AROM to WFL/WNL without pain provocation to allow adequate viewing of computer monitors for work w/o pain interference    Status Partially Met    Target Date 02/25/20      PT LONG TERM GOAL #5   Title Patient will demonstrate improved R strength to >/= 4+/5 for functional UE use    Status Partially Met    Target Date 02/25/20      PT LONG TERM GOAL #6   Title Patient will be independent with ongoing/advanced HEP    Status Partially Met    Target Date 02/25/20                 Plan - 01/28/20 1708    Clinical Impression Statement [name] reporting 99% improvement in pain since start of PT even after working extended hours (46 hrs) on computer last week which would have normally set her neck off. She notes much better awareness of posture and workstation setup. Only pain she still typically experiences is in the upper neck at the base of her skull - increased muscle tension/tightness noted in suboccipitals with good relief noted from suboccipital release. Cervical and shoulder ROM improving but mild R shoulder weakness still evident with pain reported in R biceps during resisted flexion & abduction. [name] is progressing well toward PT goals with LTGs #1-3 met and remaining goals at least partially met.    Comorbidities neck and back pain for many years; spondylosis; plantar fasciitis and Achilles tendonitis; previous Lt foot surgery ~ 3 yrs ago; gall bladder removed 1989; arthritis; asthma; HTN; PVD s/p aorto bifem BPG    Rehab Potential Good    PT Frequency 2x / week    PT Duration 8 weeks    PT Treatment/Interventions ADLs/Self Care Home  Management;Cryotherapy;Electrical Stimulation;Iontophoresis 34m/ml Dexamethasone;Moist Heat;Traction;Ultrasound;Functional mobility training;Therapeutic activities;Therapeutic exercise;Neuromuscular re-education;Patient/family education;Manual techniques;Passive range of motion;Dry needling;Taping;Vasopneumatic Device;Spinal Manipulations;Joint Manipulations    PT Next Visit Plan Postural stretching & strengthening; manual work to R shoulder girdle and neck with possible DN    PT Home Exercise Plan Access Code: [email or web address]; 11/18 - seated thoracic extension; 12/14 - supported cervical extension & rotation SNAGs, self-suboccipital release    Consulted and Agree with Plan of Care Patient           Patient will benefit from skilled therapeutic intervention in order to improve the following deficits and impairments:  Decreased activity tolerance,Decreased range of motion,Decreased strength,Hypomobility,Increased fascial restricitons,Increased muscle spasms,Impaired perceived functional ability,Impaired flexibility,Impaired UE functional use,Improper body mechanics,Postural dysfunction,Pain  Visit Diagnosis: Chronic right shoulder pain  Cervicalgia  Abnormal posture  Other symptoms and signs involving the musculoskeletal system  Muscle weakness (generalized)     Problem List Patient Active Problem List   Diagnosis Date Noted  . Acute non-recurrent frontal sinusitis 12/24/2019  . Cellulitis 10/31/2019  . Vaginal candidiasis 10/31/2019  . Primary insomnia 10/31/2019  . Abnormality of right breast on screening mammogram 10/14/2019  . DDD (degenerative disc disease), cervical 08/30/2019  . Nonalcoholic steatohepatitis (NASH) 08/08/2019  . Chronic bronchitis with productive mucopurulent cough ([place] 08/08/2019  . Postmenopausal bleeding 07/17/2019  . Moderate persistent asthma with acute bronchitis and acute exacerbation 06/27/2019  . Patellofemoral syndrome, right 06/19/2019  . Chronic  right shoulder pain 05/07/2019  .  Achilles tendon contracture, right   . Migraine with aura and without status migrainosus, not intractable 05/31/2017  . Plantar fasciitis of right foot 04/12/2017  . Asthma 04/12/2017  . SOB (shortness of breath) 07/12/2016  . Plantar fasciitis, left 11/17/2015  . Fatigue 11/17/2015  . 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  . Type 2 diabetes mellitus with vascular disease ([place]) 01/14/2015  . Vitamin D deficiency 01/14/2015  . Morbid obesity ([place]) 01/13/2015  . Fatty liver disease, nonalcoholic 16/11/9602  . Peripheral vascular disease ([place]) 07/15/2011  . Depression, major, single episode, moderate ([place]) 08/13/2008  . Allergic rhinitis 04/10/2008  . Hypothyroidism 08/04/2006  . Dyslipidemia 08/04/2006  . Essential hypertension 08/04/2006    Percival Spanish, PT, MPT 01/28/2020, 6:21 PM  [facility] [address]  [place], [place], [address] Phone: [phone]   Fax:  [phone]  Name: [name] MRN: [id number] Date of Birth: [birth year]

## 2020-02-03 ENCOUNTER — Ambulatory Visit: Payer: 59

## 2020-02-03 ENCOUNTER — Other Ambulatory Visit: Payer: Self-pay

## 2020-02-03 DIAGNOSIS — M6281 Muscle weakness (generalized): Secondary | ICD-10-CM

## 2020-02-03 DIAGNOSIS — R293 Abnormal posture: Secondary | ICD-10-CM

## 2020-02-03 DIAGNOSIS — M25511 Pain in right shoulder: Secondary | ICD-10-CM | POA: Diagnosis not present

## 2020-02-03 DIAGNOSIS — M542 Cervicalgia: Secondary | ICD-10-CM

## 2020-02-03 DIAGNOSIS — G8929 Other chronic pain: Secondary | ICD-10-CM

## 2020-02-03 DIAGNOSIS — R29898 Other symptoms and signs involving the musculoskeletal system: Secondary | ICD-10-CM

## 2020-02-03 NOTE — Therapy (Addendum)
[facility] [address]  [place], [place], [address] Phone: [phone]   Fax:  [phone]  Physical Therapy Treatment / Discharge Summary  Patient Details  Name: ZAKKIYYA BARNO MRN: [id number] Date of Birth: 1960/10/01 Referring Provider (PT): Basil Dess, MD   Encounter Date: 02/03/2020   PT End of Session - 02/03/20 1622    Visit Number 6    Number of Visits 16    Date for PT Re-Evaluation 02/25/20    Authorization Type [employer] - VL: 92 (PT,OT, ST combined)    PT Start Time 5397    PT Stop Time 1709   Ended visit with 10 min moist heat   PT Time Calculation (min) 52 min    Activity Tolerance Patient tolerated treatment well    Behavior During Therapy [facility] for tasks assessed/performed           Past Medical History:  Diagnosis Date  . ALLERGIC RHINITIS 04/10/2008  . Asthma    asthmatic bronchitis  . Breast lump in female   . Carpal tunnel syndrome of right wrist   . DEPRESSION 08/13/2008  . Diabetes mellitus without complication ([place])   . Hepatitis    fatty liver  . HYPERLIPIDEMIA 08/04/2006  . HYPERTENSION 08/04/2006  . HYPOTHYROIDISM 08/04/2006  . Kidney injury   . LOW BACK PAIN 08/04/2006  . Neuromuscular disorder ([place])   . PERIMENOPAUSAL SYNDROME 05/08/2008  . PVD (peripheral vascular disease) ([place])    occlusive, status post bifem bypass 2007    Past Surgical History:  Procedure Laterality Date  . CARPAL TUNNEL RELEASE Right 04/05/2016   Procedure: OPEN RIGHT CARPAL TUNNEL RELEASE;  Surgeon: Jessy Oto, MD;  Location: [facility];  Service: Orthopedics;  Laterality: Right;  . CHOLECYSTECTOMY    . FEMORAL BYPASS     bifem.  Marland Kitchen HAGLAND'S DEFORMITY EXCISION    . PLANTAR FASCIA RELEASE Right 04/18/2018   Procedure: PLANTAR FASCIA RELEASE AND GASTROCNEMIUS RECESSION RIGHT;  Surgeon: Newt Minion, MD;  Location: [facility];  Service: Orthopedics;  Laterality: Right;  . TUBAL LIGATION       There were no vitals filed for this visit.   Subjective Assessment - 02/03/20 1621    Subjective Pt. doing ok.    Pertinent History neck and back pain for many years; spondylosis; plantar fasciitis and Achilles tendonitis; previous Lt foot surgery ~ 3 yrs ago; gall bladder removed 1989; arthritis; asthma; HTN; PVD s/p aorto bifem BPG    Diagnostic tests 09/08/19 Shoulder MRI: 1. Severe tendinosis of supraspinatus tendon with apartial-thickness bursal surface tear anteriorly.2. Mild tendinosis of the infraspinatus tendon.3. Mild subacromial/subdeltoid bursitis.  Cervical: 1. Cervical spine spondylosis as described above. 2. No acute osseous injury of the cervical spine.    Patient Stated Goals "to be able to use shoulder more including working out with weights"    Currently in Pain? Yes    Pain Score 2     Pain Location Shoulder    Pain Orientation Right    Pain Descriptors / Indicators Aching    Pain Type Chronic pain    Pain Frequency Intermittent    Multiple Pain Sites No                             [facility] Adult PT Treatment/Exercise - 02/03/20 0001      Neck Exercises: Theraband   Rows 15 reps;Red  Rows Limitations cues for scapular depression    5" hold     Shoulder Exercises: Prone   Extension Both;10 reps    Extension Limitations prone leaning over green p-ball    Horizontal ABduction 1 Both;10 reps;Strengthening    Horizontal ABduction 1 Limitations prone over green p-ball      Shoulder Exercises: ROM/Strengthening   UBE (Upper Arm Bike) L1.5 x 6 min (3' fwd/3' back)    Cybex Row 10 reps    Cybex Row Limitations 25# - low      Shoulder Exercises: Stretch   Other Shoulder Stretches B UT, LS stretch x 30 sec each holding 5#      Moist Heat Therapy   Number Minutes Moist Heat 10 Minutes    Moist Heat Location Cervical      Manual Therapy   Manual Therapy Passive ROM    Manual therapy comments supine    Soft tissue mobilization STM to cervical  paraspinals    Passive ROM Cervical PROM all directions; Gentle UT, LS, scalenes stretch x 30 sec                    PT Short Term Goals - 01/28/20 1628      PT SHORT TERM GOAL #1   Title Patient will be independent with initial HEP    Status Achieved   01/14/20     PT SHORT TERM GOAL #2   Title Patient will verbalize/demonstrate good awareness of neutral spine posture and proper body mechanics for daily tasks    Status Achieved   01/14/20            PT Long Term Goals - 01/28/20 1628      PT LONG TERM GOAL #1   Title Improve posture and alignment with patient to demonstrate improved upright posture with posterior shoulder girdle engaged    Status Achieved   01/28/20   Target Date --      PT LONG TERM GOAL #2   Title Decrease pain in the Rt shoulder girdle by 50-75% allowing patient to use Rt UE for functional activities    Status Achieved   01/28/20 - Pt reports 99.9% improvement in pain     PT LONG TERM GOAL #3   Title Patient to improve Rt shoulder AROM to [facility] without pain provocation    Status Achieved   01/28/20     PT LONG TERM GOAL #4   Title Patient to improve cervical AROM to WFL/WNL without pain provocation to allow adequate viewing of computer monitors for work w/o pain interference    Status Partially Met    Target Date 02/25/20      PT LONG TERM GOAL #5   Title Patient will demonstrate improved R strength to >/= 4+/5 for functional UE use    Status Partially Met    Target Date 02/25/20      PT LONG TERM GOAL #6   Title Patient will be independent with ongoing/advanced HEP    Status Partially Met    Target Date 02/25/20                 Plan - 02/03/20 1623    Clinical Impression Statement Izabella doing ok.  Progressed MT for cervical ROM and periscapular strengthening activities without increased pain.  Cristel continues to note significant improvement in sleep quality and pain since starting therapy.    Comorbidities neck and back pain  for many years; spondylosis; plantar fasciitis and Achilles  tendonitis; previous Lt foot surgery ~ 3 yrs ago; gall bladder removed 1989; arthritis; asthma; HTN; PVD s/p aorto bifem BPG    Rehab Potential Good    PT Frequency 2x / week    PT Duration 8 weeks    PT Treatment/Interventions ADLs/Self Care Home Management;Cryotherapy;Electrical Stimulation;Iontophoresis 79m/ml Dexamethasone;Moist Heat;Traction;Ultrasound;Functional mobility training;Therapeutic activities;Therapeutic exercise;Neuromuscular re-education;Patient/family education;Manual techniques;Passive range of motion;Dry needling;Taping;Vasopneumatic Device;Spinal Manipulations;Joint Manipulations    PT Next Visit Plan Postural stretching & strengthening; manual work to R shoulder girdle and neck with possible DN    PT Home Exercise Plan Access Code: [email or web address]; 11/18 - seated thoracic extension; 12/14 - supported cervical extension & rotation SNAGs, self-suboccipital release    Consulted and Agree with Plan of Care Patient           Patient will benefit from skilled therapeutic intervention in order to improve the following deficits and impairments:  Decreased activity tolerance,Decreased range of motion,Decreased strength,Hypomobility,Increased fascial restricitons,Increased muscle spasms,Impaired perceived functional ability,Impaired flexibility,Impaired UE functional use,Improper body mechanics,Postural dysfunction,Pain  Visit Diagnosis: Chronic right shoulder pain  Cervicalgia  Abnormal posture  Other symptoms and signs involving the musculoskeletal system  Muscle weakness (generalized)     Problem List Patient Active Problem List   Diagnosis Date Noted  . Acute non-recurrent frontal sinusitis 12/24/2019  . Cellulitis 10/31/2019  . Vaginal candidiasis 10/31/2019  . Primary insomnia 10/31/2019  . Abnormality of right breast on screening mammogram 10/14/2019  . DDD (degenerative disc disease), cervical 08/30/2019   . Nonalcoholic steatohepatitis ([facility]) 08/08/2019  . Chronic bronchitis with productive mucopurulent cough ([place] 08/08/2019  . Postmenopausal bleeding 07/17/2019  . Moderate persistent asthma with acute bronchitis and acute exacerbation 06/27/2019  . Patellofemoral syndrome, right 06/19/2019  . Chronic right shoulder pain 05/07/2019  . Achilles tendon contracture, right   . Migraine with aura and without status migrainosus, not intractable 05/31/2017  . Plantar fasciitis of right foot 04/12/2017  . Asthma 04/12/2017  . SOB (shortness of breath) 07/12/2016  . Plantar fasciitis, left 11/17/2015  . Fatigue 11/17/2015  . 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  . Type 2 diabetes mellitus with vascular disease ([place] 01/14/2015  . Vitamin D deficiency 01/14/2015  . Morbid obesity ([place] 01/13/2015  . Fatty liver disease, nonalcoholic 006/30/1601 . Peripheral vascular disease ([place] 07/15/2011  . Depression, major, single episode, moderate ([place] 08/13/2008  . Allergic rhinitis 04/10/2008  . Hypothyroidism 08/04/2006  . Dyslipidemia 08/04/2006  . Essential hypertension 08/04/2006    MBess Harvest PTA 02/03/20 5:49 PM   [facility] [address] [place] [address]Phone: [phone]  Fax:  [phone] Name: SLEXIANNA WEINRICHMRN: [id number] of Birth: [birth year]  PHYSICAL THERAPY DISCHARGE SUMMARY  Visits from Start of Care: 6  Current functional level related to goals / functional outcomes:   Refer to above clinical impression for status as of last visit on 02/03/2020. Patient cancelled her remaining appointment indicating that she was doing well and did not feel she would need to return to PT, therefore will proceed with discharge from PT for this episode.   Remaining deficits:   As above. Unable to formally assess status as of discharge to failure to return but patient reporting no further  concerns.   Education / Equipment:   HEP   Plan: Patient agrees to discharge.  Patient goals were partially met. Patient is being discharged due to being pleased with the current functional level.  ?????  Percival Spanish, PT, MPT 03/03/20, 3:03 PM  [facility] [address]  [place], [place], [address] Phone: [phone]   Fax:  [phone]

## 2020-02-06 ENCOUNTER — Ambulatory Visit: Payer: 59

## 2020-02-11 ENCOUNTER — Other Ambulatory Visit: Payer: Self-pay | Admitting: Sports Medicine

## 2020-02-11 DIAGNOSIS — F321 Major depressive disorder, single episode, moderate: Secondary | ICD-10-CM

## 2020-02-12 ENCOUNTER — Other Ambulatory Visit: Payer: Self-pay | Admitting: Nurse Practitioner

## 2020-02-12 DIAGNOSIS — J4540 Moderate persistent asthma, uncomplicated: Secondary | ICD-10-CM

## 2020-02-13 ENCOUNTER — Encounter: Payer: 59 | Admitting: Physical Therapy

## 2020-02-17 ENCOUNTER — Ambulatory Visit: Payer: 59 | Admitting: Physical Therapy

## 2020-02-20 ENCOUNTER — Ambulatory Visit: Payer: 59 | Admitting: Physical Therapy

## 2020-02-21 ENCOUNTER — Encounter: Payer: Self-pay | Admitting: Nurse Practitioner

## 2020-02-21 ENCOUNTER — Other Ambulatory Visit: Payer: Self-pay | Admitting: Nurse Practitioner

## 2020-02-21 DIAGNOSIS — J324 Chronic pansinusitis: Secondary | ICD-10-CM

## 2020-02-21 MED ORDER — DOXYCYCLINE HYCLATE 100 MG PO TABS: 100.0000 mg | ORAL_TABLET | Freq: Two times a day (BID) | ORAL | 0 refills | Status: DC

## 2020-02-21 MED ORDER — FLUCONAZOLE 150 MG PO TABS: 150.0000 mg | ORAL_TABLET | Freq: Every day | ORAL | 1 refills | Status: DC

## 2020-02-21 NOTE — Telephone Encounter (Signed)
Last seen for this 01/16/2020, does she need a new appt?

## 2020-03-05 ENCOUNTER — Ambulatory Visit: Payer: 59 | Admitting: Specialist

## 2020-03-07 ENCOUNTER — Other Ambulatory Visit: Payer: Self-pay | Admitting: Sports Medicine

## 2020-03-07 DIAGNOSIS — F321 Major depressive disorder, single episode, moderate: Secondary | ICD-10-CM

## 2020-05-11 ENCOUNTER — Encounter (HOSPITAL_BASED_OUTPATIENT_CLINIC_OR_DEPARTMENT_OTHER): Payer: Self-pay | Admitting: Nurse Practitioner

## 2020-05-11 ENCOUNTER — Ambulatory Visit (HOSPITAL_BASED_OUTPATIENT_CLINIC_OR_DEPARTMENT_OTHER): Payer: 59 | Admitting: Nurse Practitioner

## 2020-05-11 ENCOUNTER — Other Ambulatory Visit: Payer: Self-pay

## 2020-05-11 VITALS — BP 159/81 | HR 68 | Ht 64.0 in | Wt 213.6 lb

## 2020-05-11 DIAGNOSIS — Z7689 Persons encountering health services in other specified circumstances: Secondary | ICD-10-CM | POA: Insufficient documentation

## 2020-05-11 DIAGNOSIS — F321 Major depressive disorder, single episode, moderate: Secondary | ICD-10-CM

## 2020-05-11 DIAGNOSIS — F418 Other specified anxiety disorders: Secondary | ICD-10-CM | POA: Diagnosis not present

## 2020-05-11 DIAGNOSIS — J0110 Acute frontal sinusitis, unspecified: Secondary | ICD-10-CM | POA: Diagnosis not present

## 2020-05-11 DIAGNOSIS — E1159 Type 2 diabetes mellitus with other circulatory complications: Secondary | ICD-10-CM

## 2020-05-11 DIAGNOSIS — Z6836 Body mass index (BMI) 36.0-36.9, adult: Secondary | ICD-10-CM | POA: Insufficient documentation

## 2020-05-11 DIAGNOSIS — F5101 Primary insomnia: Secondary | ICD-10-CM

## 2020-05-11 MED ORDER — PREDNISONE 50 MG PO TABS: 50.0000 mg | ORAL_TABLET | Freq: Every day | ORAL | 0 refills | Status: DC

## 2020-05-11 MED ORDER — ALPRAZOLAM 0.25 MG PO TABS: 0.2500 mg | ORAL_TABLET | Freq: Two times a day (BID) | ORAL | 0 refills | Status: DC | PRN

## 2020-05-11 MED ORDER — FLUCONAZOLE 150 MG PO TABS: 150.0000 mg | ORAL_TABLET | Freq: Every day | ORAL | 1 refills | Status: DC

## 2020-05-11 MED ORDER — DOXYCYCLINE HYCLATE 100 MG PO TABS: 100.0000 mg | ORAL_TABLET | Freq: Two times a day (BID) | ORAL | 0 refills | Status: DC

## 2020-05-11 NOTE — Assessment & Plan Note (Signed)
Anxiety exacerbation resulting from the recent anniversary of the passing of her daughter and her daughter's birthday.  She has been doing amazingly well on treatment over the past year and has come a very long way however she does report that she still gets very tearful when thinking about her daughter and she misses her dearly. Recommend continue venlafaxine for mood control and will refill prescription of Xanax for as needed use.  Patient shows no signs of abuse with 30 pills lasting greater than 6 months on a consistent basis. Recommend follow-up if symptoms worsen or fail to improve.

## 2020-05-11 NOTE — Assessment & Plan Note (Signed)
She is due for hemoglobin A1c testing in the very near future.  We will plan for a follow-up visit for evaluation of her diabetes as today she is primarily here for sick visit. She reports that she is still having some difficulty remembering to take the Metformin however she does report improved consistency over past doses. We will plan to follow-up for lab work and foot examination in the next few months.

## 2020-05-11 NOTE — Assessment & Plan Note (Signed)
Patient known to this provider presenting today to establish care. Medical history and family history discussed. Patient is due for annual physical exam in the next few weeks however will postpone this for couple months to allow her to recover from recent infection. Plan to follow-up in 2 3 months for physical exam with labs and will review her chronic medical concerns at that time.

## 2020-05-11 NOTE — Progress Notes (Signed)
New Patient Office Visit  Subjective:  Patient ID: [name], female    DOB: 1960/10/26  Age: 60 y.o. MRN: [id number]  CC:  Chief Complaint  Patient presents with  . Sinus Problem  . Ear Fullness    HPI [name] is a pleasant 60 year old female known to this provider who presents today to establish care with this practice.  She has a longstanding history of chronic sinusitis and bronchitis symptoms.  She reports that this winter she has done exceptionally well and is managed to stay overall healthy.  She tells me approximately 1 week ago she started to experience some cold type symptoms with sinus pain and pressure and left-sided ear pain and pressure.  She has been taking Mucinex to help with this which has been effective however her symptoms are continuing and she is concerned that it is now moving down into her chest as she has woke the past several days with notable mucus in her chest and cough. In the past she has had difficulty controlling her sinusitis once it has exacerbated to the point that it reaches her chest therefore she would like to stay ahead of it this time.  She reports increased anxiety and agitation over the past several weeks.  She has just recently passed the anniversary of the passing of her daughter as well as her daughter's birthday which have both been significantly difficult for her to get through.  She reports that she has noticed a shorter temper with work however overall she feels like she is doing very well.  She reports using her Xanax only on a very intermittent as needed basis with last taking this around Thanksgiving of last year.  She would like a refill on this today if at all possible.  Past Medical History:  Diagnosis Date  . ALLERGIC RHINITIS 04/10/2008  . Asthma    asthmatic bronchitis  . Breast lump in female   . Carpal tunnel syndrome of right wrist   . DEPRESSION 08/13/2008  . Diabetes mellitus without complication ([place])   . Hepatitis     fatty liver  . HYPERLIPIDEMIA 08/04/2006  . HYPERTENSION 08/04/2006  . HYPOTHYROIDISM 08/04/2006  . Kidney injury   . LOW BACK PAIN 08/04/2006  . Neuromuscular disorder ([place])   . PERIMENOPAUSAL SYNDROME 05/08/2008  . PVD (peripheral vascular disease) ([place])    occlusive, status post bifem bypass 2007    Past Surgical History:  Procedure Laterality Date  . CARPAL TUNNEL RELEASE Right 04/05/2016   Procedure: OPEN RIGHT CARPAL TUNNEL RELEASE;  Surgeon: Jessy Oto, MD;  Location: [place];  Service: Orthopedics;  Laterality: Right;  . CHOLECYSTECTOMY    . FEMORAL BYPASS     bifem.  Marland Kitchen HAGLAND'S DEFORMITY EXCISION    . PLANTAR FASCIA RELEASE Right 04/18/2018   Procedure: PLANTAR FASCIA RELEASE AND GASTROCNEMIUS RECESSION RIGHT;  Surgeon: Newt Minion, MD;  Location: [place];  Service: Orthopedics;  Laterality: Right;  . TUBAL LIGATION      Family History  Problem Relation Age of Onset  . Hypertension Mother   . Heart disease Father   . Hyperlipidemia Father   . Hypertension Father   . Alcohol abuse Brother   . Diabetes Maternal Grandmother   . Alcohol abuse Maternal Uncle   . Stroke Maternal Uncle   . Ovarian cancer Daughter     Social History   Socioeconomic History  . Marital status: Married    Spouse name:  Not on file  . Number of children: 3  . Years of education: Not on file  . Highest education level: Not on file  Occupational History  . Not on file  Tobacco Use  . Smoking status: Former Smoker    Quit date: 02/15/2004    Years since quitting: 16.2  . Smokeless tobacco: Never Used  Vaping Use  . Vaping Use: Never used  Substance and Sexual Activity  . Alcohol use: Yes    Comment: occasional  . Drug use: No  . Sexual activity: Yes    Birth control/protection: Post-menopausal  Other Topics Concern  . Not on file  Social History Narrative  . Not on file   Social Determinants of Health   Financial Resource Strain: Not on file  Food  Insecurity: Not on file  Transportation Needs: Not on file  Physical Activity: Not on file  Stress: Not on file  Social Connections: Not on file  Intimate Partner Violence: Not on file    ROS Review of Systems  Constitutional: Positive for chills and fatigue. Negative for activity change, appetite change and fever.  HENT: Positive for congestion, ear pain, postnasal drip, rhinorrhea, sinus pressure, sinus pain, sore throat and voice change.   Respiratory: Positive for cough and wheezing. Negative for chest tightness and shortness of breath.   Cardiovascular: Negative for chest pain, palpitations and leg swelling.  Gastrointestinal: Negative for constipation, diarrhea and nausea.  Musculoskeletal: Positive for arthralgias.  Neurological: Positive for headaches. Negative for weakness and light-headedness.  Psychiatric/Behavioral: Positive for agitation and sleep disturbance. The patient is nervous/anxious.     Objective:   Today's Vitals: BP (!) 159/81   Pulse 68   Ht 5' 4"  (1.626 m)   Wt 213 lb 9.6 oz (96.9 kg)   SpO2 100%   BMI 36.66 kg/m   Physical Exam Vitals and nursing note reviewed.  Constitutional:      Appearance: Normal appearance. She is obese.  HENT:     Head: Normocephalic.     Right Ear: A middle ear effusion is present.     Left Ear: A middle ear effusion is present. Tympanic membrane is erythematous.     Nose: Congestion and rhinorrhea present.     Mouth/Throat:     Mouth: Mucous membranes are moist.     Pharynx: Oropharynx is clear. Posterior oropharyngeal erythema present.  Eyes:     Extraocular Movements: Extraocular movements intact.     Conjunctiva/sclera: Conjunctivae normal.     Pupils: Pupils are equal, round, and reactive to light.  Cardiovascular:     Rate and Rhythm: Normal rate and regular rhythm.     Pulses: Normal pulses.     Heart sounds: Normal heart sounds.  Pulmonary:     Effort: Pulmonary effort is normal.     Breath sounds: Normal  breath sounds. No wheezing.  Abdominal:     General: Bowel sounds are normal. There is no distension.     Palpations: Abdomen is soft.     Tenderness: There is no abdominal tenderness.  Musculoskeletal:        General: Normal range of motion.     Cervical back: Normal range of motion.     Right lower leg: No edema.     Left lower leg: No edema.  Lymphadenopathy:     Cervical: Cervical adenopathy present.  Skin:    General: Skin is warm and dry.     Capillary Refill: Capillary refill takes less than 2 seconds.  Neurological:     General: No focal deficit present.     Mental Status: She is alert and oriented to person, place, and time.  Psychiatric:        Mood and Affect: Mood normal.        Behavior: Behavior normal.        Thought Content: Thought content normal.        Judgment: Judgment normal.     Assessment & Plan:   Problem List Items Addressed This Visit      Respiratory   Acute non-recurrent frontal sinusitis - Primary   Relevant Medications   doxycycline (VIBRA-TABS) 100 MG tablet   predniSONE (DELTASONE) 50 MG tablet   fluconazole (DIFLUCAN) 150 MG tablet    Other Visit Diagnoses    Situational anxiety       Relevant Medications   ALPRAZolam (XANAX) 0.25 MG tablet      Outpatient Encounter Medications as of 05/11/2020  Medication Sig  . albuterol (PROVENTIL) (2.5 MG/3ML) 0.083% nebulizer solution Take 3 mLs (2.5 mg total) by nebulization every 6 (six) hours as needed for wheezing or shortness of breath.  Marland Kitchen albuterol (VENTOLIN HFA) 108 (90 Base) MCG/ACT inhaler Inhale 1-2 puffs into the lungs every 4 (four) hours as needed for wheezing or shortness of breath.  Marland Kitchen aspirin EC 81 MG tablet Take 1 tablet (81 mg total) by mouth daily. Swallow whole.  Marland Kitchen azelastine (ASTELIN) 0.1 % nasal spray Place 1 spray into both nostrils daily as needed for rhinitis.  . B Complex Vitamins (VITAMIN B COMPLEX PO) Take 1 tablet by mouth daily.  . benazepril (LOTENSIN) 10 MG tablet  TAKE 1 TABLET BY MOUTH EVERY DAY  . BREO ELLIPTA 100-25 MCG/INH AEPB TAKE 1 PUFF BY MOUTH EVERY DAY  . desloratadine (CLARINEX) 5 MG tablet Take 1 tablet (5 mg total) by mouth daily.  Marland Kitchen doxycycline (VIBRA-TABS) 100 MG tablet Take 1 tablet (100 mg total) by mouth 2 (two) times daily.  . famotidine (PEPCID) 40 MG tablet Take 40 mg by mouth at bedtime.  . fluconazole (DIFLUCAN) 150 MG tablet Take 1 tablet (150 mg total) by mouth daily.  Marland Kitchen ibuprofen (ADVIL,MOTRIN) 200 MG tablet Take 400-600 mg by mouth every 6 (six) hours as needed for headache or moderate pain.  Marland Kitchen levothyroxine (SYNTHROID) 88 MCG tablet Take 1 tablet (88 mcg total) by mouth daily.  . Menthol, Topical Analgesic, (ICY HOT EX) Apply 1 application topically daily as needed (pain).  . metFORMIN (GLUCOPHAGE XR) 500 MG 24 hr tablet Take 2 tablets (1,000 mg total) by mouth daily with breakfast.  . metoprolol succinate (TOPROL-XL) 100 MG 24 hr tablet Take 1 tablet (100 mg total) by mouth daily. Take with or immediately following a meal.  . mupirocin ointment (BACTROBAN) 2 % Apply to affected area TID for 7 days.  . predniSONE (DELTASONE) 50 MG tablet Take 1 tablet (50 mg total) by mouth daily.  . rosuvastatin (CRESTOR) 40 MG tablet Take 1 tablet (40 mg total) by mouth daily.  Marland Kitchen umeclidinium bromide (INCRUSE ELLIPTA) 62.5 MCG/INH AEPB Inhale 1 puff into the lungs daily.  Marland Kitchen venlafaxine XR (EFFEXOR-XR) 75 MG 24 hr capsule TAKE 3 CAPSULES (225 MG TOTAL) BY MOUTH DAILY WITH BREAKFAST.  Marland Kitchen ALPRAZolam (XANAX) 0.25 MG tablet Take 1 tablet (0.25 mg total) by mouth 2 (two) times daily as needed for anxiety.  . naproxen (NAPROSYN) 375 MG tablet Take 1 tablet (375 mg total) by mouth 2 (two) times daily with a meal.  . [  DISCONTINUED] acetaminophen-codeine (TYLENOL #3) 300-30 MG tablet Take 1 tablet by mouth every 4 (four) hours as needed for moderate pain.  . [DISCONTINUED] ALPRAZolam (XANAX) 0.25 MG tablet Take 1 tablet (0.25 mg total) by mouth 2 (two)  times daily as needed for anxiety.  . [DISCONTINUED] doxycycline (VIBRA-TABS) 100 MG tablet Take 1 tablet (100 mg total) by mouth 2 (two) times daily.  . [DISCONTINUED] fluconazole (DIFLUCAN) 150 MG tablet Take 1 tablet (150 mg total) by mouth daily.   No facility-administered encounter medications on file as of 05/11/2020.   Time: 60 minutes, >50% spent counseling, care coordination, chart review, and documentation.    Follow-up: Return if symptoms worsen or fail to improve, for 3-4 months for follow-up DM/HTN.   Orma Render, NP

## 2020-05-11 NOTE — Assessment & Plan Note (Signed)
>>  ASSESSMENT AND PLAN FOR TYPE 2 DIABETES MELLITUS WITH COMPLICATIONS ([facility]CC) WRITTEN ON 05/11/2020  5:33 PM BY Siearra Amberg E, NP  She is due for hemoglobin A1c testing in the very near future.  We will plan for a follow-up visit for evaluation of her diabetes as today she is primarily here for sick visit. She reports that she is still having some difficulty remembering to take the Metformin  however she does report improved consistency over past doses. We will plan to follow-up for lab work and foot examination in the next few months.

## 2020-05-11 NOTE — Assessment & Plan Note (Signed)
Depression appears to be stable on current regimen with slight increase in symptoms after recent anniversary of the passing of her daughter and her daughter's birthday. During this first year since the loss she is passing many milestones and has made significant improvement in her symptoms management and mood. She is taking daily Effexor and as needed alprazolam with 30 tablets lasting greater than 6 months. Refill provided today on alprazolam recommend that she contact the office if her symptoms appear to be worsening or her medication is no longer working effectively. Otherwise follow-up in 2 to 3 months for physical exam.

## 2020-05-11 NOTE — Assessment & Plan Note (Signed)
Symptoms and presentation consistent with sinusitis today. Given that the symptoms have been present for greater than 7 days we will go ahead and start antibiotic with doxycycline which has been effective for the patient's symptoms in the past. We will also add prednisone burst for the patient to utilize if she begins to have wheezing or increased shortness of breath. Diflucan added for yeast infection that the patient typically gets with antibiotic use. Continued use of Mucinex daily however I would avoid Mucinex with additional medications and then to prevent elevation of blood pressure. Follow-up if symptoms worsen or fail to improve

## 2020-05-11 NOTE — Assessment & Plan Note (Signed)
BMI 36.66 in the office today. She was doing quite well on diet and exercise however this past year has been quite difficult for her with going through many milestones of the passing of her daughter. For her overall health I do believe that it would be beneficial if we could get her BMI down a little bit and we will begin working on that starting at the next visit.  I am hopeful with increased activity her mood will also improve.  She has been through quite a lot and is still grieving over her loss therefore drastic changes are not recommended at this time until she is more emotionally stable.

## 2020-05-11 NOTE — Patient Instructions (Signed)
Lets plan to meet in 3 months or so when you are feeling better and things have slowed down to check your labs. You can schedule this today or call and schedule when you are feeling better.

## 2020-05-11 NOTE — Assessment & Plan Note (Signed)
Longstanding history of insomnia with frequent waking throughout the night.  She does report that her body typically is not ready for bed until 2 or 3:00 in the morning however she does report that she is able to lay down during the day and take a 1 hour nap as needed to help her make it through the day. She reports that this is not particularly distressing for her and is not interfering in her daily activities. Recommend that she rest as much as possible and allows her body to recuperate even if she is not sleeping but just laying and resting. No further interventions at this time.

## 2020-05-13 NOTE — Progress Notes (Deleted)
HPI: FU chest pain and coronary calcification noted on CT scan.  Note patient followed at [facility] for severe asthma.  Holter monitor November 2017 showed sinus rhythm with PACs.  Chest CT June 2021 performed at [facility] showed extensive atherosclerotic coronary artery calcification and hepatic steatosis.  Nuclear study August 2021 showed ejection fraction 71% and normal perfusion.   Carotid Dopplers August 2021 showed 40 to 59% right and 1 to 39% left stenosis.  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    Current Outpatient Medications  Medication Sig Dispense Refill  . albuterol (PROVENTIL) (2.5 MG/3ML) 0.083% nebulizer solution Take 3 mLs (2.5 mg total) by nebulization every 6 (six) hours as needed for wheezing or shortness of breath. 150 mL 1  . albuterol (VENTOLIN HFA) 108 (90 Base) MCG/ACT inhaler Inhale 1-2 puffs into the lungs every 4 (four) hours as needed for wheezing or shortness of breath. 8 g 2  . ALPRAZolam (XANAX) 0.25 MG tablet Take 1 tablet (0.25 mg total) by mouth 2 (two) times daily as needed for anxiety. 30 tablet 0  . aspirin EC 81 MG tablet Take 1 tablet (81 mg total) by mouth daily. Swallow whole. 90 tablet 3  . azelastine (ASTELIN) 0.1 % nasal spray Place 1 spray into both nostrils daily as needed for rhinitis. 30 mL 5  . B Complex Vitamins (VITAMIN B COMPLEX PO) Take 1 tablet by mouth daily.    . benazepril (LOTENSIN) 10 MG tablet TAKE 1 TABLET BY MOUTH EVERY DAY 30 tablet 5  . BREO ELLIPTA 100-25 MCG/INH AEPB TAKE 1 PUFF BY MOUTH EVERY DAY 60 each 5  . desloratadine (CLARINEX) 5 MG tablet Take 1 tablet (5 mg total) by mouth daily. 30 tablet 11  . doxycycline (VIBRA-TABS) 100 MG tablet Take 1 tablet (100 mg total) by mouth 2 (two) times daily. 14 tablet 0  . famotidine (PEPCID) 40 MG tablet Take 40 mg by mouth at bedtime.    . fluconazole (DIFLUCAN) 150 MG tablet Take 1 tablet (150 mg total) by mouth daily. 1 tablet 1  . ibuprofen  (ADVIL,MOTRIN) 200 MG tablet Take 400-600 mg by mouth every 6 (six) hours as needed for headache or moderate pain.    Marland Kitchen levothyroxine (SYNTHROID) 88 MCG tablet Take 1 tablet (88 mcg total) by mouth daily. 90 tablet 3  . Menthol, Topical Analgesic, (ICY HOT EX) Apply 1 application topically daily as needed (pain).    . metFORMIN (GLUCOPHAGE XR) 500 MG 24 hr tablet Take 2 tablets (1,000 mg total) by mouth daily with breakfast. 180 tablet 1  . metoprolol succinate (TOPROL-XL) 100 MG 24 hr tablet Take 1 tablet (100 mg total) by mouth daily. Take with or immediately following a meal. 90 tablet 3  . mupirocin ointment (BACTROBAN) 2 % Apply to affected area TID for 7 days. 30 g 3  . predniSONE (DELTASONE) 50 MG tablet Take 1 tablet (50 mg total) by mouth daily. 5 tablet 0  . rosuvastatin (CRESTOR) 40 MG tablet Take 1 tablet (40 mg total) by mouth daily. 90 tablet 3  . umeclidinium bromide (INCRUSE ELLIPTA) 62.5 MCG/INH AEPB Inhale 1 puff into the lungs daily. 7 each 5  . venlafaxine XR (EFFEXOR-XR) 75 MG 24 hr capsule TAKE 3 CAPSULES (225 MG TOTAL) BY MOUTH DAILY WITH BREAKFAST. 270 capsule 2   No current facility-administered medications for this visit.     Past Medical History:  Diagnosis Date  . ALLERGIC RHINITIS 04/10/2008  .  Asthma    asthmatic bronchitis  . Breast lump in female   . Carpal tunnel syndrome of right wrist   . DEPRESSION 08/13/2008  . Diabetes mellitus without complication ([place])   . Hepatitis    fatty liver  . HYPERLIPIDEMIA 08/04/2006  . HYPERTENSION 08/04/2006  . HYPOTHYROIDISM 08/04/2006  . Kidney injury   . LOW BACK PAIN 08/04/2006  . Neuromuscular disorder ([place])   . PERIMENOPAUSAL SYNDROME 05/08/2008  . PVD (peripheral vascular disease) ([place])    occlusive, status post bifem bypass 2007    Past Surgical History:  Procedure Laterality Date  . CARPAL TUNNEL RELEASE Right 04/05/2016   Procedure: OPEN RIGHT CARPAL TUNNEL RELEASE;  Surgeon: Jessy Oto, MD;  Location:  [place];  Service: Orthopedics;  Laterality: Right;  . CHOLECYSTECTOMY    . FEMORAL BYPASS     bifem.  Marland Kitchen HAGLAND'S DEFORMITY EXCISION    . PLANTAR FASCIA RELEASE Right 04/18/2018   Procedure: PLANTAR FASCIA RELEASE AND GASTROCNEMIUS RECESSION RIGHT;  Surgeon: Newt Minion, MD;  Location: [place];  Service: Orthopedics;  Laterality: Right;  . TUBAL LIGATION      Social History   Socioeconomic History  . Marital status: Married    Spouse name: Not on file  . Number of children: 3  . Years of education: Not on file  . Highest education level: Not on file  Occupational History  . Not on file  Tobacco Use  . Smoking status: Former Smoker    Quit date: 02/15/2004    Years since quitting: 16.2  . Smokeless tobacco: Never Used  Vaping Use  . Vaping Use: Never used  Substance and Sexual Activity  . Alcohol use: Yes    Comment: occasional  . Drug use: No  . Sexual activity: Yes    Birth control/protection: Post-menopausal  Other Topics Concern  . Not on file  Social History Narrative  . Not on file   Social Determinants of Health   Financial Resource Strain: Not on file  Food Insecurity: Not on file  Transportation Needs: Not on file  Physical Activity: Not on file  Stress: Not on file  Social Connections: Not on file  Intimate Partner Violence: Not on file    Family History  Problem Relation Age of Onset  . Hypertension Mother   . Heart disease Father   . Hyperlipidemia Father   . Hypertension Father   . Alcohol abuse Brother   . Diabetes Maternal Grandmother   . Alcohol abuse Maternal Uncle   . Stroke Maternal Uncle   . Ovarian cancer Daughter     ROS: no fevers or chills, productive cough, hemoptysis, dysphasia, odynophagia, melena, hematochezia, dysuria, hematuria, rash, seizure activity, orthopnea, PND, pedal edema, claudication. Remaining systems are negative.  Physical Exam: Well-developed well-nourished in no acute distress.  Skin is warm  and dry.  HEENT is normal.  Neck is supple.  Chest is clear to auscultation with normal expansion.  Cardiovascular exam is regular rate and rhythm.  Abdominal exam nontender or distended. No masses palpated. Extremities show no edema. neuro grossly intact  ECG- personally reviewed  A/P  1 coronary artery disease-based on chest CT demonstrating coronary calcification.  Nuclear study showed no ischemia.  Plan medical therapy.  Continue aspirin and statin.  2 carotid artery disease-patient will need follow-up carotid Dopplers August 2022.  3 dyspnea-this is felt likely pulmonary related.  4 hyperlipidemia-continue statin.  5 hypertension-blood pressure controlled.  Continue present medications and follow.  6 peripheral vascular disease-continue statin.  Kirk Ruths, MD

## 2020-05-20 ENCOUNTER — Ambulatory Visit: Payer: 59 | Admitting: Cardiology

## 2020-05-25 ENCOUNTER — Other Ambulatory Visit: Payer: Self-pay

## 2020-05-25 ENCOUNTER — Ambulatory Visit: Payer: Self-pay

## 2020-05-25 ENCOUNTER — Encounter: Payer: Self-pay | Admitting: Specialist

## 2020-05-25 ENCOUNTER — Ambulatory Visit (INDEPENDENT_AMBULATORY_CARE_PROVIDER_SITE_OTHER): Payer: 59 | Admitting: Specialist

## 2020-05-25 VITALS — BP 115/77 | HR 79 | Ht 64.0 in | Wt 214.0 lb

## 2020-05-25 DIAGNOSIS — M2241 Chondromalacia patellae, right knee: Secondary | ICD-10-CM | POA: Diagnosis not present

## 2020-05-25 DIAGNOSIS — M25559 Pain in unspecified hip: Secondary | ICD-10-CM | POA: Diagnosis not present

## 2020-05-25 DIAGNOSIS — M25561 Pain in right knee: Secondary | ICD-10-CM | POA: Diagnosis not present

## 2020-05-25 DIAGNOSIS — M1611 Unilateral primary osteoarthritis, right hip: Secondary | ICD-10-CM

## 2020-05-25 MED ORDER — BUPIVACAINE HCL 0.25 % IJ SOLN
4.0000 mL | INTRAMUSCULAR | Status: AC | PRN
  Administered 2020-05-25: 4 mL via INTRA_ARTICULAR

## 2020-05-25 MED ORDER — METHYLPREDNISOLONE ACETATE 40 MG/ML IJ SUSP
40.0000 mg | INTRAMUSCULAR | Status: AC | PRN
  Administered 2020-05-25: 40 mg via INTRA_ARTICULAR

## 2020-05-25 NOTE — Progress Notes (Signed)
Office Visit Note   Patient: [name]           Date of Birth: 04/13/1960           MRN: [id number] Visit Date: 05/25/2020              Requested by: Orma Render, NP [place],  [place] [address] PCP: Orma Render, NP   Assessment & Plan: Visit Diagnoses:  1. Right knee pain, unspecified chronicity   2. Hip pain     Plan: Plan: Knee is suffering from osteoarthritis, only real proven treatments are Weight loss, NSIADs like diclofenac gel and exercise. Well padded shoes help. Ice the knee that is suffering from osteoarthritis, only real proven treatments are  Well padded shoes help. Ice the knee 2-3 times a day 15-20 mins at a time.-3 times a day 15-20 mins at a time. Hot showers in the AM.  Injection with steroid may be of benefit. Hemp CBD capsules, [email or web address] 5,000-7,000 mg per bottle, 60 capsules per bottle, take one capsule twice a day. Cane in the left hand to use with left leg weight bearing. Follow-Up Instructions: No follow-ups on file.   Follow-Up Instructions: No follow-ups on file.   Orders:  Orders Placed This Encounter  Procedures  . XR HIP UNILAT W OR W/O PELVIS 2-3 VIEWS RIGHT  . XR Knee 1-2 Views Right   No orders of the defined types were placed in this encounter.     Procedures: Large Joint Inj: R knee on 05/25/2020 4:35 PM Indications: pain Details: 25 G 1.5 in needle, anterolateral approach  Arthrogram: No  Medications: 40 mg methylPREDNISolone acetate 40 MG/ML; 4 mL bupivacaine 0.25 % Outcome: tolerated well, no immediate complications Procedure, treatment alternatives, risks and benefits explained, specific risks discussed. Consent was given by the patient. Immediately prior to procedure a time out was called to verify the correct patient, procedure, equipment, support staff and site/side marked as required. Patient was prepped and draped in the usual sterile fashion.       Clinical Data: No additional  findings.   Subjective: Chief Complaint  Patient presents with  . Right Hip - Pain  . Right Knee - Pain    60 year old female with history of right hip pain and right knee, pain with first arising and weather changes. Right knee with grating, pain with squatting and kneeling and stair climbing.  CBD seems to be of benefit. Right hip with stiffness.    Review of Systems  Constitutional: Negative.   HENT: Negative.   Eyes: Negative.   Respiratory: Negative.   Cardiovascular: Negative.   Gastrointestinal: Negative.   Endocrine: Negative.   Genitourinary: Negative.   Musculoskeletal: Negative.   Skin: Negative.   Allergic/Immunologic: Negative.   Neurological: Negative.   Hematological: Negative.   Psychiatric/Behavioral: Negative.      Objective: Vital Signs: BP 115/77 (BP Location: Left Arm, Patient Position: Sitting)   Pulse 79   Ht 5' 4"  (1.626 m)   Wt 214 lb (97.1 kg)   BMI 36.73 kg/m   Physical Exam Constitutional:      Appearance: She is well-developed.  HENT:     Head: Normocephalic and atraumatic.  Eyes:     Pupils: Pupils are equal, round, and reactive to light.  Pulmonary:     Effort: Pulmonary effort is normal.     Breath sounds: Normal breath sounds.  Abdominal:     General: Bowel sounds are  normal.     Palpations: Abdomen is soft.  Musculoskeletal:        General: Normal range of motion.     Cervical back: Normal range of motion and neck supple.  Skin:    General: Skin is warm and dry.  Neurological:     Mental Status: She is alert and oriented to person, place, and time.  Psychiatric:        Behavior: Behavior normal.        Thought Content: Thought content normal.        Judgment: Judgment normal.     Ortho Exam  Specialty Comments:  No specialty comments available.  Imaging: No results found.   PMFS History: Patient Active Problem List   Diagnosis Date Noted  . Encounter to establish care 05/11/2020  . Situational anxiety  05/11/2020  . Body mass index (BMI) of 36.0-36.9 in adult 05/11/2020  . Acute non-recurrent frontal sinusitis 12/24/2019  . Cellulitis 10/31/2019  . Vaginal candidiasis 10/31/2019  . Primary insomnia 10/31/2019  . Abnormality of right breast on screening mammogram 10/14/2019  . DDD (degenerative disc disease), cervical 08/30/2019  . Nonalcoholic steatohepatitis ([facility]) 08/08/2019  . Postmenopausal bleeding 07/17/2019  . Moderate persistent asthma with acute bronchitis and acute exacerbation 06/27/2019  . Patellofemoral syndrome, right 06/19/2019  . Chronic right shoulder pain 05/07/2019  . Achilles tendon contracture, right   . Migraine with aura and without status migrainosus, not intractable 05/31/2017  . Plantar fasciitis of right foot 04/12/2017  . Asthma 04/12/2017  . SOB (shortness of breath) 07/12/2016  . Plantar fasciitis, left 11/17/2015  . Fatigue 11/17/2015  . 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  . Type 2 diabetes mellitus with vascular disease ([place]) 01/14/2015  . Vitamin D deficiency 01/14/2015  . Fatty liver disease, nonalcoholic 21/19/4174  . Peripheral vascular disease ([facility] [facility]) 07/15/2011  . Depression, major, single episode, moderate ([place]) 08/13/2008  . Allergic rhinitis 04/10/2008  . Hypothyroidism 08/04/2006  . Dyslipidemia 08/04/2006  . Essential hypertension 08/04/2006   Past Medical History:  Diagnosis Date  . ALLERGIC RHINITIS 04/10/2008  . Asthma    asthmatic bronchitis  . Breast lump in female   . Carpal tunnel syndrome of right wrist   . DEPRESSION 08/13/2008  . Diabetes mellitus without complication ([place])   . Hepatitis    fatty liver  . HYPERLIPIDEMIA 08/04/2006  . HYPERTENSION 08/04/2006  . HYPOTHYROIDISM 08/04/2006  . Kidney injury   . LOW BACK PAIN 08/04/2006  . Neuromuscular disorder ([place])   . PERIMENOPAUSAL SYNDROME 05/08/2008  . PVD (peripheral vascular disease) ([place])    occlusive, status post bifem bypass 2007    Family History   Problem Relation Age of Onset  . Hypertension Mother   . Heart disease Father   . Hyperlipidemia Father   . Hypertension Father   . Alcohol abuse Brother   . Diabetes Maternal Grandmother   . Alcohol abuse Maternal Uncle   . Stroke Maternal Uncle   . Ovarian cancer Daughter     Past Surgical History:  Procedure Laterality Date  . CARPAL TUNNEL RELEASE Right 04/05/2016   Procedure: OPEN RIGHT CARPAL TUNNEL RELEASE;  Surgeon: Jessy Oto, MD;  Location: [facility];  Service: Orthopedics;  Laterality: Right;  . CHOLECYSTECTOMY    . FEMORAL BYPASS     bifem.  Marland Kitchen HAGLAND'S DEFORMITY EXCISION    . PLANTAR FASCIA RELEASE Right 04/18/2018   Procedure: PLANTAR FASCIA RELEASE AND GASTROCNEMIUS RECESSION RIGHT;  Surgeon: Newt Minion,  MD;  Location: [facility];  Service: Orthopedics;  Laterality: Right;  . TUBAL LIGATION     Social History   Occupational History  . Not on file  Tobacco Use  . Smoking status: Former Smoker    Quit date: 02/15/2004    Years since quitting: 16.2  . Smokeless tobacco: Never Used  Vaping Use  . Vaping Use: Never used  Substance and Sexual Activity  . Alcohol use: Yes    Comment: occasional  . Drug use: No  . Sexual activity: Yes    Birth control/protection: Post-menopausal

## 2020-06-02 ENCOUNTER — Other Ambulatory Visit (HOSPITAL_BASED_OUTPATIENT_CLINIC_OR_DEPARTMENT_OTHER): Payer: Self-pay | Admitting: Nurse Practitioner

## 2020-06-02 ENCOUNTER — Encounter (HOSPITAL_BASED_OUTPATIENT_CLINIC_OR_DEPARTMENT_OTHER): Payer: Self-pay | Admitting: Nurse Practitioner

## 2020-06-02 ENCOUNTER — Other Ambulatory Visit (HOSPITAL_BASED_OUTPATIENT_CLINIC_OR_DEPARTMENT_OTHER): Payer: Self-pay

## 2020-06-02 DIAGNOSIS — I10 Essential (primary) hypertension: Secondary | ICD-10-CM

## 2020-06-02 MED ORDER — BENAZEPRIL HCL 10 MG PO TABS
10.0000 mg | ORAL_TABLET | Freq: Every day | ORAL | 0 refills | Status: DC
  Filled 2020-06-02 (×2): qty 90, 90d supply, fill #0

## 2020-06-03 ENCOUNTER — Other Ambulatory Visit (HOSPITAL_BASED_OUTPATIENT_CLINIC_OR_DEPARTMENT_OTHER): Payer: Self-pay

## 2020-08-04 ENCOUNTER — Encounter (HOSPITAL_BASED_OUTPATIENT_CLINIC_OR_DEPARTMENT_OTHER): Payer: Self-pay | Admitting: Nurse Practitioner

## 2020-08-04 ENCOUNTER — Ambulatory Visit (HOSPITAL_BASED_OUTPATIENT_CLINIC_OR_DEPARTMENT_OTHER): Payer: 59 | Admitting: Nurse Practitioner

## 2020-08-04 ENCOUNTER — Other Ambulatory Visit: Payer: Self-pay

## 2020-08-04 VITALS — BP 143/83 | HR 83 | Ht 64.0 in | Wt 209.0 lb

## 2020-08-04 DIAGNOSIS — J0141 Acute recurrent pansinusitis: Secondary | ICD-10-CM | POA: Insufficient documentation

## 2020-08-04 DIAGNOSIS — J0110 Acute frontal sinusitis, unspecified: Secondary | ICD-10-CM

## 2020-08-04 DIAGNOSIS — Z6836 Body mass index (BMI) 36.0-36.9, adult: Secondary | ICD-10-CM

## 2020-08-04 DIAGNOSIS — L719 Rosacea, unspecified: Secondary | ICD-10-CM

## 2020-08-04 DIAGNOSIS — J0140 Acute pansinusitis, unspecified: Secondary | ICD-10-CM | POA: Insufficient documentation

## 2020-08-04 DIAGNOSIS — E890 Postprocedural hypothyroidism: Secondary | ICD-10-CM

## 2020-08-04 DIAGNOSIS — J4541 Moderate persistent asthma with (acute) exacerbation: Secondary | ICD-10-CM

## 2020-08-04 DIAGNOSIS — E1159 Type 2 diabetes mellitus with other circulatory complications: Secondary | ICD-10-CM

## 2020-08-04 DIAGNOSIS — I739 Peripheral vascular disease, unspecified: Secondary | ICD-10-CM

## 2020-08-04 DIAGNOSIS — I10 Essential (primary) hypertension: Secondary | ICD-10-CM

## 2020-08-04 HISTORY — DX: Acute recurrent pansinusitis: J01.41

## 2020-08-04 MED ORDER — DOXYCYCLINE HYCLATE 100 MG PO TABS: 100.0000 mg | ORAL_TABLET | Freq: Two times a day (BID) | ORAL | 0 refills | Status: DC

## 2020-08-04 MED ORDER — FLUCONAZOLE 150 MG PO TABS: 150.0000 mg | ORAL_TABLET | Freq: Once | ORAL | 1 refills | Status: AC

## 2020-08-04 MED ORDER — AZELAIC ACID 15 % EX GEL
CUTANEOUS | 6 refills | Status: DC
  Filled 2020-12-09: qty 50, 30d supply, fill #0

## 2020-08-04 NOTE — Assessment & Plan Note (Signed)
HbA1c ordered today.  Metformin is causing some GI distress, which is bothersome to the patient.  Discussed option of changing medication to GLP-1 for increased cardiac benefit, weight loss, and blood sugar control.  Will wait until A1c is back before making any decisions, she may be well controlled with her weight loss regimen.  Foot exam normal.

## 2020-08-04 NOTE — Assessment & Plan Note (Signed)
Bilateral wheezing present in the setting of sinusitis.  Doxycycline for sinusitis infection. Continue inhalers.  If symptoms progress, patient is aware to contact me for steroid burst. Would like to avoid at this time due to blood sugars.  F/U if symptoms worsen or fail to improve.

## 2020-08-04 NOTE — Assessment & Plan Note (Signed)
Trial of azelaic acid gel for rosacea symptoms Keep face well moisturized and avoid sun exposure F/U if symptoms fail to improve.

## 2020-08-04 NOTE — Assessment & Plan Note (Signed)
>>  ASSESSMENT AND PLAN FOR TYPE 2 DIABETES MELLITUS WITH COMPLICATIONS (HCC) WRITTEN ON 08/04/2020  5:20 PM BY Amiri Riechers E, NP  HbA1c ordered today.  Metformin  is causing some GI distress, which is bothersome to the patient.  Discussed option of changing medication to GLP-1 for increased cardiac benefit, weight loss, and blood sugar control.  Will wait until A1c is back before making any decisions, she may be well controlled with her weight loss regimen.  Foot exam normal.

## 2020-08-04 NOTE — Assessment & Plan Note (Signed)
On statin therapy. Pedal pulses appropriate today with no signs of edema or skin breakdown. Will plan to check lipids at next visit when fasting.  Followed by Dr. Stanford Breed with Cards.

## 2020-08-04 NOTE — Assessment & Plan Note (Signed)
BP slightly elevated today- most likely due to current infection as she has been well controlled in the past on the same regimen.  Recommend monitoring at home and F/U if BP remains >130/80

## 2020-08-04 NOTE — Patient Instructions (Signed)
Ill let you know about the lab work. If you need any refills let me know.   Think about the Trulicity, if you want to stop the metformin and start this we can do that.

## 2020-08-04 NOTE — Assessment & Plan Note (Signed)
10 lb weight loss in 2.5 weeks- monitoring intake and replacing meals with healthy low carbohydrate, low calorie snack options.  Excellent progress so far!

## 2020-08-04 NOTE — Assessment & Plan Note (Signed)
Recurrent sinus exacerbation with asthma exacerbation. This is the second infection this year.  Will treat with doxycycline as this has been effective in the past. Adding diflucan for yeast.  Will hold off on prednisone burst at this time, but patient is aware to contact the office if symptoms worsen to call and we will send this in.  Recommend Mucinex for symptom management and continue inhaler use.  F/U if sx worsen or fail to improve.

## 2020-08-04 NOTE — Assessment & Plan Note (Signed)
Recheck TSH today- continue medications at current dose. Will make changes to plan of care as appropriate based on labs

## 2020-08-04 NOTE — Progress Notes (Signed)
Established Patient Office Visit  Subjective:  Patient ID: [name], female    DOB: 06-Jun-1960  Age: 60 y.o. MRN: [id number]  CC:  Chief Complaint  Patient presents with   Sinus Problem    Patient states her sinuses are bothering her, headache, left ear hurts and feels like it has fluid in it and asthma symptoms.  Dry cough.  No fever or chills.    HPI [name] presents for sinus symptoms that started about a week ago. She endorses sinus pain and pressure, left ear pain, and exacerbation of her asthma. She denies fever or chills, or body aches, but she has had increased fatigue. She has a long standing history of sinusitis and significant asthma exacerbations with sinus symptoms.   She also has concerns for rosacea with erythema to her cheeks bilaterally and her nose. She reports symptoms have been present her entire life. She typically wears makeup to cover it up, but she would like to have some relief without make-up.  She is also due for labs for her DM, HTN, and Thyroid.  She is currently following a new dietary program and has lost 10 pounds in the last 2.5 weeks with healthy eating and monitoring her caloric intake. She reports that she feels better overall on this plan, aside from her current illness.  She is taking her medication as prescribed, but does endorse increased GERD and upset stomach with the metformin. She denies any hypothyroid symptoms at this time, but with the weight loss would like to keep a close eye on her TSH levels.   Past Medical History:  Diagnosis Date   ALLERGIC RHINITIS 04/10/2008   Asthma    asthmatic bronchitis   Breast lump in female    Carpal tunnel syndrome of right wrist    Cellulitis 10/31/2019   Chronic right shoulder pain 05/07/2019   DEPRESSION 08/13/2008   Diabetes mellitus without complication ([facility])    Fatigue 11/17/2015   Fatty liver disease, nonalcoholic 04/21/6576   Hepatitis    fatty liver   HYPERLIPIDEMIA 08/04/2006    HYPERTENSION 08/04/2006   HYPOTHYROIDISM 08/04/2006   Kidney injury    LOW BACK PAIN 08/04/2006   Neuromuscular disorder ([facility])    PERIMENOPAUSAL SYNDROME 05/08/2008   Primary insomnia 10/31/2019   PVD (peripheral vascular disease) ([facility])    occlusive, status post bifem bypass 2007   Vaginal candidiasis 10/31/2019    Past Surgical History:  Procedure Laterality Date   CARPAL TUNNEL RELEASE Right 04/05/2016   Procedure: OPEN RIGHT CARPAL TUNNEL RELEASE;  Surgeon: Jessy Oto, MD;  Location: [facility];  Service: Orthopedics;  Laterality: Right;   CHOLECYSTECTOMY     FEMORAL BYPASS     bifem.   HAGLAND'S DEFORMITY EXCISION     PLANTAR FASCIA RELEASE Right 04/18/2018   Procedure: PLANTAR FASCIA RELEASE AND GASTROCNEMIUS RECESSION RIGHT;  Surgeon: Newt Minion, MD;  Location: [facility];  Service: Orthopedics;  Laterality: Right;   TUBAL LIGATION      Family History  Problem Relation Age of Onset   Hypertension Mother    Heart disease Father    Hyperlipidemia Father    Hypertension Father    Alcohol abuse Brother    Diabetes Maternal Grandmother    Alcohol abuse Maternal Uncle    Stroke Maternal Uncle    Ovarian cancer Daughter     Social History   Socioeconomic History   Marital status: Married    Spouse name: Not on  file   Number of children: 3   Years of education: Not on file   Highest education level: Not on file  Occupational History   Not on file  Tobacco Use   Smoking status: Former    Pack years: 0.00    Types: Cigarettes    Quit date: 02/15/2004    Years since quitting: 16.4   Smokeless tobacco: Never  Vaping Use   Vaping Use: Never used  Substance and Sexual Activity   Alcohol use: Yes    Comment: occasional   Drug use: No   Sexual activity: Yes    Birth control/protection: Post-menopausal  Other Topics Concern   Not on file  Social History Narrative   Not on file   Social Determinants of Health   Financial Resource Strain: Not on file   Food Insecurity: Not on file  Transportation Needs: Not on file  Physical Activity: Not on file  Stress: Not on file  Social Connections: Not on file  Intimate Partner Violence: Not on file    Outpatient Medications Prior to Visit  Medication Sig Dispense Refill   albuterol (PROVENTIL) (2.5 MG/3ML) 0.083% nebulizer solution Take 3 mLs (2.5 mg total) by nebulization every 6 (six) hours as needed for wheezing or shortness of breath. 150 mL 1   albuterol (VENTOLIN HFA) 108 (90 Base) MCG/ACT inhaler Inhale 1-2 puffs into the lungs every 4 (four) hours as needed for wheezing or shortness of breath. 8 g 2   ALPRAZolam (XANAX) 0.25 MG tablet Take 1 tablet (0.25 mg total) by mouth 2 (two) times daily as needed for anxiety. 30 tablet 0   aspirin EC 81 MG tablet Take 1 tablet (81 mg total) by mouth daily. Swallow whole. 90 tablet 3   azelastine (ASTELIN) 0.1 % nasal spray Place 1 spray into both nostrils daily as needed for rhinitis. 30 mL 5   B Complex Vitamins (VITAMIN B COMPLEX PO) Take 1 tablet by mouth daily.     benazepril (LOTENSIN) 10 MG tablet Take 1 tablet (10 mg total) by mouth daily. 90 tablet 0   BREO ELLIPTA 100-25 MCG/INH AEPB TAKE 1 PUFF BY MOUTH EVERY DAY 60 each 5   desloratadine (CLARINEX) 5 MG tablet Take 1 tablet (5 mg total) by mouth daily. 30 tablet 11   famotidine (PEPCID) 40 MG tablet Take 40 mg by mouth at bedtime.     levothyroxine (SYNTHROID) 88 MCG tablet Take 1 tablet (88 mcg total) by mouth daily. 90 tablet 3   Menthol, Topical Analgesic, (ICY HOT EX) Apply 1 application topically daily as needed (pain).     metFORMIN (GLUCOPHAGE XR) 500 MG 24 hr tablet Take 2 tablets (1,000 mg total) by mouth daily with breakfast. 180 tablet 1   metoprolol succinate (TOPROL-XL) 100 MG 24 hr tablet Take 1 tablet (100 mg total) by mouth daily. Take with or immediately following a meal. 90 tablet 3   rosuvastatin (CRESTOR) 40 MG tablet Take 1 tablet (40 mg total) by mouth daily. 90 tablet  3   umeclidinium bromide (INCRUSE ELLIPTA) 62.5 MCG/INH AEPB Inhale 1 puff into the lungs daily. 7 each 5   venlafaxine XR (EFFEXOR-XR) 75 MG 24 hr capsule TAKE 3 CAPSULES (225 MG TOTAL) BY MOUTH DAILY WITH BREAKFAST. 270 capsule 2   doxycycline (VIBRA-TABS) 100 MG tablet Take 1 tablet (100 mg total) by mouth 2 (two) times daily. 14 tablet 0   fluconazole (DIFLUCAN) 150 MG tablet Take 1 tablet (150 mg total) by mouth  daily. 1 tablet 1   ibuprofen (ADVIL,MOTRIN) 200 MG tablet Take 400-600 mg by mouth every 6 (six) hours as needed for headache or moderate pain.     mupirocin ointment (BACTROBAN) 2 % Apply to affected area TID for 7 days. 30 g 3   predniSONE (DELTASONE) 50 MG tablet Take 1 tablet (50 mg total) by mouth daily. 5 tablet 0   No facility-administered medications prior to visit.    Allergies  Allergen Reactions   Influenza Virus Vacc Split Pf Swelling and Other (See Comments)    Swelling around injection site   Lipitor [Atorvastatin] Other (See Comments)    Hepatis     ROS Review of Systems All review of systems negative except what is listed in the HPI    Objective:    Physical Exam Vitals and nursing note reviewed.  Constitutional:      Appearance: Normal appearance.  HENT:     Head: Normocephalic and atraumatic.     Comments: Erythema and scattered telangectasia present bilaterally across the cheeks and bridge of the nose consistent with rosacea. No papules or pustules present. Skin intact.    Nose: Congestion and rhinorrhea present.     Mouth/Throat:     Mouth: Mucous membranes are moist.     Pharynx: Oropharynx is clear. Posterior oropharyngeal erythema present.  Eyes:     Extraocular Movements: Extraocular movements intact.     Conjunctiva/sclera: Conjunctivae normal.     Pupils: Pupils are equal, round, and reactive to light.  Cardiovascular:     Rate and Rhythm: Normal rate and regular rhythm.     Pulses: Normal pulses.     Heart sounds: Normal heart  sounds.  Pulmonary:     Effort: Pulmonary effort is normal.     Breath sounds: Wheezing present. No rhonchi.  Abdominal:     General: Abdomen is flat. Bowel sounds are normal. There is no distension.     Palpations: Abdomen is soft.     Tenderness: There is no abdominal tenderness. There is no guarding.  Musculoskeletal:        General: Normal range of motion.     Cervical back: Normal range of motion.     Right lower leg: No edema.     Left lower leg: No edema.  Lymphadenopathy:     Cervical: Cervical adenopathy present.  Skin:    General: Skin is warm and dry.     Capillary Refill: Capillary refill takes less than 2 seconds.  Neurological:     General: No focal deficit present.     Mental Status: She is alert and oriented to person, place, and time.     Sensory: No sensory deficit.     Motor: No weakness.  Psychiatric:        Mood and Affect: Mood normal.        Behavior: Behavior normal.        Thought Content: Thought content normal.        Judgment: Judgment normal.    BP (!) 143/83   Pulse 83   Ht 5' 4"  (1.626 m)   Wt 209 lb (94.8 kg)   SpO2 98%   BMI 35.87 kg/m  Wt Readings from Last 3 Encounters:  08/04/20 209 lb (94.8 kg)  05/25/20 214 lb (97.1 kg)  05/11/20 213 lb 9.6 oz (96.9 kg)     Health Maintenance Due  Topic Date Due   OPHTHALMOLOGY EXAM  Never done   COLONOSCOPY (Pts 45-57yr Insurance coverage  will need to be confirmed)  Never done   Zoster Vaccines- Shingrix (2 of 2) 01/15/2018   COVID-19 Vaccine (4 - Booster for Pfizer series) 04/07/2020   HEMOGLOBIN A1C  05/10/2020    There are no preventive care reminders to display for this patient.  Lab Results  Component Value Date   TSH 1.41 05/17/2019   Lab Results  Component Value Date   WBC 7.3 05/17/2019   HGB 14.2 05/17/2019   HCT 41.5 05/17/2019   MCV 89.1 05/17/2019   PLT 192 05/17/2019   Lab Results  Component Value Date   NA 139 05/17/2019   K 4.3 05/17/2019   CO2 21 05/17/2019    GLUCOSE 118 (H) 05/17/2019   BUN 16 05/17/2019   CREATININE 0.97 05/17/2019   BILITOT 0.4 05/17/2019   ALKPHOS 116 04/18/2018   AST 20 05/17/2019   ALT 25 05/17/2019   PROT 6.7 05/17/2019   ALBUMIN 3.7 04/18/2018   CALCIUM 9.3 05/17/2019   ANIONGAP 11 04/18/2018   GFR 74.37 03/25/2013   Lab Results  Component Value Date   CHOL 193 05/17/2019   Lab Results  Component Value Date   HDL 41 (L) 05/17/2019   Lab Results  Component Value Date   LDLCALC 130 (H) 05/17/2019   Lab Results  Component Value Date   TRIG 117 05/17/2019   Lab Results  Component Value Date   CHOLHDL 4.7 05/17/2019   Lab Results  Component Value Date   HGBA1C 6.8 (A) 11/11/2019      Assessment & Plan:   Problem List Items Addressed This Visit     Hypothyroidism    Recheck TSH today- continue medications at current dose. Will make changes to plan of care as appropriate based on labs        Relevant Orders   TSH   Essential hypertension    BP slightly elevated today- most likely due to current infection as she has been well controlled in the past on the same regimen.  Recommend monitoring at home and F/U if BP remains >130/80        Relevant Orders   CBC with Differential   Comprehensive metabolic panel   TSH   Hemoglobin A1c   Peripheral vascular disease ([place])    On statin therapy. Pedal pulses appropriate today with no signs of edema or skin breakdown. Will plan to check lipids at next visit when fasting.  Followed by Dr. Stanford Breed with Cards.        Type 2 diabetes mellitus with vascular disease ([place])    HbA1c ordered today.  Metformin is causing some GI distress, which is bothersome to the patient.  Discussed option of changing medication to GLP-1 for increased cardiac benefit, weight loss, and blood sugar control.  Will wait until A1c is back before making any decisions, she may be well controlled with her weight loss regimen.  Foot exam normal.        Relevant Orders    CBC with Differential   Comprehensive metabolic panel   TSH   Hemoglobin A1c   Moderate persistent asthma with (acute) exacerbation    Bilateral wheezing present in the setting of sinusitis.  Doxycycline for sinusitis infection. Continue inhalers.  If symptoms progress, patient is aware to contact me for steroid burst. Would like to avoid at this time due to blood sugars.  F/U if symptoms worsen or fail to improve.        Acute non-recurrent frontal sinusitis  Recurrent sinus exacerbation with asthma exacerbation. This is the second infection this year.  Will treat with doxycycline as this has been effective in the past. Adding diflucan for yeast.  Will hold off on prednisone burst at this time, but patient is aware to contact the office if symptoms worsen to call and we will send this in.  Recommend Mucinex for symptom management and continue inhaler use.  F/U if sx worsen or fail to improve.        Relevant Medications   doxycycline (VIBRA-TABS) 100 MG tablet   fluconazole (DIFLUCAN) 150 MG tablet   Body mass index (BMI) of 36.0-36.9 in adult    10 lb weight loss in 2.5 weeks- monitoring intake and replacing meals with healthy low carbohydrate, low calorie snack options.  Excellent progress so far!         Relevant Orders   CBC with Differential   Comprehensive metabolic panel   TSH   Rosacea - Primary    Trial of azelaic acid gel for rosacea symptoms Keep face well moisturized and avoid sun exposure F/U if symptoms fail to improve.        Relevant Medications   Azelaic Acid 15 % gel    Meds ordered this encounter  Medications   doxycycline (VIBRA-TABS) 100 MG tablet    Sig: Take 1 tablet (100 mg total) by mouth 2 (two) times daily.    Dispense:  14 tablet    Refill:  0   Azelaic Acid 15 % gel    Sig: After skin is thoroughly washed and patted dry, gently but thoroughly massage a thin film of azelaic acid cream into the affected area twice daily, in the  morning and evening.    Dispense:  50 g    Refill:  6   fluconazole (DIFLUCAN) 150 MG tablet    Sig: Take 1 tablet (150 mg total) by mouth once for 1 dose. Repeat dose 72 hours if yeast infection persists    Dispense:  2 tablet    Refill:  1    Follow-up: Return in about 6 months (around 02/03/2021) for DM.    Orma Render, NP

## 2020-08-11 ENCOUNTER — Ambulatory Visit (HOSPITAL_BASED_OUTPATIENT_CLINIC_OR_DEPARTMENT_OTHER): Payer: 59 | Admitting: Nurse Practitioner

## 2020-08-26 ENCOUNTER — Ambulatory Visit (INDEPENDENT_AMBULATORY_CARE_PROVIDER_SITE_OTHER): Payer: 59 | Admitting: Nurse Practitioner

## 2020-08-26 ENCOUNTER — Other Ambulatory Visit: Payer: Self-pay

## 2020-08-26 DIAGNOSIS — E1159 Type 2 diabetes mellitus with other circulatory complications: Secondary | ICD-10-CM

## 2020-08-26 DIAGNOSIS — I10 Essential (primary) hypertension: Secondary | ICD-10-CM

## 2020-08-26 DIAGNOSIS — E890 Postprocedural hypothyroidism: Secondary | ICD-10-CM

## 2020-08-26 LAB — POCT GLYCOSYLATED HEMOGLOBIN (HGB A1C): HbA1c POC (<> result, manual entry): 6.7 % (ref 4.0–5.6)

## 2020-08-27 LAB — CBC WITH DIFFERENTIAL
Basophils Absolute: 0.1 10*3/uL (ref 0.0–0.2)
Basos: 1 %
EOS (ABSOLUTE): 0.1 10*3/uL (ref 0.0–0.4)
Eos: 2 %
Hematocrit: 43 % (ref 34.0–46.6)
Hemoglobin: 14.8 g/dL (ref 11.1–15.9)
Immature Grans (Abs): 0 10*3/uL (ref 0.0–0.1)
Immature Granulocytes: 0 %
Lymphocytes Absolute: 2.9 10*3/uL (ref 0.7–3.1)
Lymphs: 48 %
MCH: 31.1 pg (ref 26.6–33.0)
MCHC: 34.4 g/dL (ref 31.5–35.7)
MCV: 90 fL (ref 79–97)
Monocytes Absolute: 0.6 10*3/uL (ref 0.1–0.9)
Monocytes: 10 %
Neutrophils Absolute: 2.3 10*3/uL (ref 1.4–7.0)
Neutrophils: 39 %
RBC: 4.76 x10E6/uL (ref 3.77–5.28)
RDW: 12.5 % (ref 11.7–15.4)
WBC: 6 10*3/uL (ref 3.4–10.8)

## 2020-08-27 LAB — COMPREHENSIVE METABOLIC PANEL
ALT: 33 IU/L — ABNORMAL HIGH (ref 0–32)
AST: 28 IU/L (ref 0–40)
Albumin/Globulin Ratio: 1.4 (ref 1.2–2.2)
Albumin: 4.4 g/dL (ref 3.8–4.9)
Alkaline Phosphatase: 134 IU/L — ABNORMAL HIGH (ref 44–121)
BUN/Creatinine Ratio: 20 (ref 12–28)
BUN: 15 mg/dL (ref 8–27)
Bilirubin Total: 0.3 mg/dL (ref 0.0–1.2)
CO2: 21 mmol/L (ref 20–29)
Calcium: 9.5 mg/dL (ref 8.7–10.3)
Chloride: 103 mmol/L (ref 96–106)
Creatinine, Ser: 0.74 mg/dL (ref 0.57–1.00)
Globulin, Total: 3.1 g/dL (ref 1.5–4.5)
Glucose: 149 mg/dL — ABNORMAL HIGH (ref 65–99)
Potassium: 4.6 mmol/L (ref 3.5–5.2)
Sodium: 138 mmol/L (ref 134–144)
Total Protein: 7.5 g/dL (ref 6.0–8.5)
eGFR: 93 mL/min/{1.73_m2} (ref >59)

## 2020-08-27 LAB — TSH: TSH: 2.04 u[IU]/mL (ref 0.450–4.500)

## 2020-08-28 ENCOUNTER — Ambulatory Visit (HOSPITAL_BASED_OUTPATIENT_CLINIC_OR_DEPARTMENT_OTHER): Payer: 59 | Admitting: Nurse Practitioner

## 2020-08-28 ENCOUNTER — Other Ambulatory Visit: Payer: Self-pay

## 2020-08-28 ENCOUNTER — Encounter (HOSPITAL_BASED_OUTPATIENT_CLINIC_OR_DEPARTMENT_OTHER): Payer: Self-pay | Admitting: Nurse Practitioner

## 2020-08-28 VITALS — BP 122/72 | HR 73 | Ht 64.0 in | Wt 208.0 lb

## 2020-08-28 DIAGNOSIS — E890 Postprocedural hypothyroidism: Secondary | ICD-10-CM

## 2020-08-28 DIAGNOSIS — E1159 Type 2 diabetes mellitus with other circulatory complications: Secondary | ICD-10-CM

## 2020-08-28 DIAGNOSIS — I10 Essential (primary) hypertension: Secondary | ICD-10-CM

## 2020-08-28 DIAGNOSIS — Z6836 Body mass index (BMI) 36.0-36.9, adult: Secondary | ICD-10-CM

## 2020-08-28 DIAGNOSIS — K7581 Nonalcoholic steatohepatitis (NASH): Secondary | ICD-10-CM

## 2020-08-28 MED ORDER — FREESTYLE LIBRE 2 SENSOR MISC
12 refills | Status: DC
  Filled 2020-12-08 – 2021-06-21 (×3): qty 2, 28d supply, fill #0

## 2020-08-28 MED ORDER — FREESTYLE LIBRE 2 READER DEVI: 0 refills | Status: DC

## 2020-08-28 MED ORDER — TRULICITY 0.75 MG/0.5ML ~~LOC~~ SOAJ
0.7500 mg | SUBCUTANEOUS | 6 refills | Status: DC
  Filled 2020-12-09: qty 2, 28d supply, fill #0
  Filled 2021-01-12: qty 2, 28d supply, fill #1
  Filled 2021-02-26: qty 2, 28d supply, fill #2
  Filled 2021-03-24: qty 2, 28d supply, fill #3

## 2020-08-28 NOTE — Assessment & Plan Note (Signed)
Recent A1c 6.8% Foot exam today normal.  Plan to start Trulicity today- samples provided for 2 weeks until PA can be established.  Start CGM with FreeStyle Libre2. Sample provided today and prescription sent to pharmacy.  Continue diet modifications- You are doing a great job! Try to walk 20 minutes every day.  F/U in 3 months

## 2020-08-28 NOTE — Progress Notes (Signed)
Established Patient Office Visit  Subjective:  Patient ID: [name], female    DOB: 1960-09-24  Age: 60 y.o. MRN: [id number]  CC:  Chief Complaint  Patient presents with   Follow-up    Patient wants to discuss lab results and Trulicity.    HPI [name] presents for chronic disease management.  She endorses frustration over weight loss efforts and her blood sugar. She has been working diligently on a specialized diet plan and her A1c has increased. She would like to discuss going on Trulicity for her diabetes and for cardiac protection.   DIABETES Taking medications as prescribed: no Glucose Monitoring: no Hypoglycemic episodes:no Polydipsia/polyuria: no Visual changes: no Chest pain: no Paresthesias: no Blood Pressure Monitoring: a few times a month Retinal Examination: Not up to Date Foot Exam:  done today Pneumovax: Up to Date Influenza: Up to Date Aspirin: no  HYPERTENSION Hypertension status: controlled  Satisfied with current treatment? yes Duration of hypertension: chronic BP monitoring frequency:  not checking BP medication side effects:  no Medication compliance: good compliance Aspirin: no Recurrent headaches: no Visual changes: no Palpitations: no Dyspnea: no Chest pain: no Lower extremity edema: no Dizzy/lightheaded: no   Outpatient Medications Prior to Visit  Medication Sig Dispense Refill   albuterol (PROVENTIL) (2.5 MG/3ML) 0.083% nebulizer solution Take 3 mLs (2.5 mg total) by nebulization every 6 (six) hours as needed for wheezing or shortness of breath. 150 mL 1   albuterol (VENTOLIN HFA) 108 (90 Base) MCG/ACT inhaler Inhale 1-2 puffs into the lungs every 4 (four) hours as needed for wheezing or shortness of breath. 8 g 2   ALPRAZolam (XANAX) 0.25 MG tablet Take 1 tablet (0.25 mg total) by mouth 2 (two) times daily as needed for anxiety. 30 tablet 0   aspirin EC 81 MG tablet Take 1 tablet (81 mg total) by mouth daily. Swallow whole. 90  tablet 3   Azelaic Acid 15 % gel After skin is thoroughly washed and patted dry, gently but thoroughly massage a thin film of azelaic acid cream into the affected area twice daily, in the morning and evening. 50 g 6   azelastine (ASTELIN) 0.1 % nasal spray Place 1 spray into both nostrils daily as needed for rhinitis. 30 mL 5   B Complex Vitamins (VITAMIN B COMPLEX PO) Take 1 tablet by mouth daily.     benazepril (LOTENSIN) 10 MG tablet Take 1 tablet (10 mg total) by mouth daily. 90 tablet 0   BREO ELLIPTA 100-25 MCG/INH AEPB TAKE 1 PUFF BY MOUTH EVERY DAY 60 each 5   desloratadine (CLARINEX) 5 MG tablet Take 1 tablet (5 mg total) by mouth daily. 30 tablet 11   doxycycline (VIBRA-TABS) 100 MG tablet Take 1 tablet (100 mg total) by mouth 2 (two) times daily. 14 tablet 0   famotidine (PEPCID) 40 MG tablet Take 40 mg by mouth at bedtime.     levothyroxine (SYNTHROID) 88 MCG tablet Take 1 tablet (88 mcg total) by mouth daily. 90 tablet 3   Menthol, Topical Analgesic, (ICY HOT EX) Apply 1 application topically daily as needed (pain).     metoprolol succinate (TOPROL-XL) 100 MG 24 hr tablet Take 1 tablet (100 mg total) by mouth daily. Take with or immediately following a meal. 90 tablet 3   rosuvastatin (CRESTOR) 40 MG tablet Take 1 tablet (40 mg total) by mouth daily. 90 tablet 3   umeclidinium bromide (INCRUSE ELLIPTA) 62.5 MCG/INH AEPB Inhale 1 puff into the  lungs daily. 7 each 5   venlafaxine XR (EFFEXOR-XR) 75 MG 24 hr capsule TAKE 3 CAPSULES (225 MG TOTAL) BY MOUTH DAILY WITH BREAKFAST. 270 capsule 2   metFORMIN (GLUCOPHAGE XR) 500 MG 24 hr tablet Take 2 tablets (1,000 mg total) by mouth daily with breakfast. 180 tablet 1   No facility-administered medications prior to visit.    Allergies  Allergen Reactions   Influenza Virus Vacc Split Pf Swelling and Other (See Comments)    Swelling around injection site   Lipitor [Atorvastatin] Other (See Comments)    Hepatis     ROS Review of  Systems All review of systems negative except what is listed in the HPI    Objective:    Physical Exam Vitals and nursing note reviewed.  Constitutional:      Appearance: Normal appearance.  HENT:     Head: Normocephalic and atraumatic.  Eyes:     Extraocular Movements: Extraocular movements intact.     Conjunctiva/sclera: Conjunctivae normal.     Pupils: Pupils are equal, round, and reactive to light.  Neck:     Vascular: No carotid bruit.  Cardiovascular:     Rate and Rhythm: Normal rate and regular rhythm.     Pulses: Normal pulses.     Heart sounds: Normal heart sounds.  Pulmonary:     Effort: Pulmonary effort is normal.     Breath sounds: Normal breath sounds.  Musculoskeletal:     Cervical back: Normal range of motion.     Right lower leg: No edema.     Left lower leg: No edema.  Skin:    General: Skin is warm and dry.     Capillary Refill: Capillary refill takes less than 2 seconds.  Neurological:     General: No focal deficit present.     Mental Status: She is alert and oriented to person, place, and time.  Psychiatric:        Mood and Affect: Mood normal.        Behavior: Behavior normal.        Thought Content: Thought content normal.        Judgment: Judgment normal.    BP 122/72   Pulse 73   Ht _0  (1.626 m)   Wt 208 lb (94.3 kg)   SpO2 97%   BMI 35.70 kg/m  Wt Readings from Last 3 Encounters:  08/28/20 208 lb (94.3 kg)  08/04/20 209 lb (94.8 kg)  05/25/20 214 lb (97.1 kg)     Health Maintenance Due  Topic Date Due   OPHTHALMOLOGY EXAM  Never done   COLONOSCOPY (Pts 45-94yr Insurance coverage will need to be confirmed)  Never done   Zoster Vaccines- Shingrix (2 of 2) 01/15/2018   COVID-19 Vaccine (4 - Booster for [place]) 04/07/2020    There are no preventive care reminders to display for this patient.  Lab Results  Component Value Date   TSH 2.040 08/26/2020   Lab Results  Component Value Date   WBC 6.0 08/26/2020   HGB  14.8 08/26/2020   HCT 43.0 08/26/2020   MCV 90 08/26/2020   PLT 192 05/17/2019   Lab Results  Component Value Date   NA 138 08/26/2020   K 4.6 08/26/2020   CO2 21 08/26/2020   GLUCOSE 149 (H) 08/26/2020   BUN 15 08/26/2020   CREATININE 0.74 08/26/2020   BILITOT 0.3 08/26/2020   ALKPHOS 134 (H) 08/26/2020   AST 28 08/26/2020   ALT 33 (  H) 08/26/2020   PROT 7.5 08/26/2020   ALBUMIN 4.4 08/26/2020   CALCIUM 9.5 08/26/2020   ANIONGAP 11 04/18/2018   EGFR 93 08/26/2020   GFR 74.37 03/25/2013   Lab Results  Component Value Date   CHOL 193 05/17/2019   Lab Results  Component Value Date   HDL 41 (L) 05/17/2019   Lab Results  Component Value Date   LDLCALC 130 (H) 05/17/2019   Lab Results  Component Value Date   TRIG 117 05/17/2019   Lab Results  Component Value Date   CHOLHDL 4.7 05/17/2019   Lab Results  Component Value Date   HGBA1C 6.7 08/26/2020      Assessment & Plan:   Problem List Items Addressed This Visit     Hypothyroidism    Labs look great. No changes.  Continue levothyroxine 18mg daily.  Monitor for changes with weight changes as diet and exercise continues.  F/U in 6 months       Essential hypertension    BP well controlled today with no concerns.  Continue benazepril and metoprolol.  F/U in 6 months       Relevant Medications   Dulaglutide (TRULICITY) 03.84MTX/6.4WOSOPN   Continuous Blood Gluc Sensor (FREESTYLE LIBRE 2 SENSOR) MISC   Continuous Blood Gluc Receiver (FREESTYLE LIBRE 2 READER) DEVI   Type 2 diabetes mellitus with vascular disease ([place] - Primary    Recent A1c 6.8% Foot exam today normal.  Plan to start Trulicity today- samples provided for 2 weeks until PA can be established.  Start CGM with FreeStyle Libre2. Sample provided today and prescription sent to pharmacy.  Continue diet modifications- You are doing a great job! Try to walk 20 minutes every day.  F/U in 3 months       Relevant Medications   Dulaglutide  (TRULICITY) 00.32MZY/2.4MGSOPN   Continuous Blood Gluc Sensor (FREESTYLE LIBRE 2 SENSOR) MISC   Continuous Blood Gluc Receiver (FREESTYLE LIBRE 2 READER) DEVI   Nonalcoholic steatohepatitis (NASH)    Slight elevation in ALT and Alk Phos on recent labs Did recently restart cholesterol medications.  Will recheck in about 3 months to make sure these have not gotten worse.        Relevant Medications   Dulaglutide (TRULICITY) 05.00MBB/0.4UGSOPN   Continuous Blood Gluc Sensor (FREESTYLE LIBRE 2 SENSOR) MISC   Continuous Blood Gluc Receiver (FREESTYLE LIBRE 2 READER) DEVI   Body mass index (BMI) of 36.0-36.9 in adult    BMI down to 35.7 with diet changes and activity.  Starting trulicity today to see if we can get improved control over blood sugars and help with weight changes. Free Style Libre for CGM to be started.  F/U in 3 months       Relevant Medications   Dulaglutide (TRULICITY) 08.91MQX/4.5WTSOPN   Continuous Blood Gluc Sensor (FREESTYLE LIBRE 2 SENSOR) MISC   Continuous Blood Gluc Receiver (FREESTYLE LIBRE 2 READER) DEVI    Meds ordered this encounter  Medications   Dulaglutide (TRULICITY) 08.88MKC/0.0LKSOPN    Sig: Inject 0.75 mg into the skin once a week.    Dispense:  2 mL    Refill:  6   Continuous Blood Gluc Sensor (FREESTYLE LIBRE 2 SENSOR) MISC    Sig: Attach one sensor to the arm every 14 days.    Dispense:  2 each    Refill:  12   Continuous Blood Gluc Receiver (FREESTYLE LIBRE 2 READER) DEVI  Sig: Use to monitor blood sugar continuously. AM reading goal: 70-110    2 hours after meal reading goal: less than 180    Dispense:  1 each    Refill:  0    Follow-up: Return in about 3 months (around 11/28/2020) for VV DM.    Orma Render, NP

## 2020-08-28 NOTE — Progress Notes (Signed)
Appt today- will discuss findings with patient.   CBC normal- no anemia or infection.  Kidney function and electrolytes look good.  Glucose is elevated.  Alk Phos and ALT slightly elevated. No concerns with AST, albumin, or bilirubin levels. Consider increased fat intake as possible source- will discuss ALP isoenzyme test.  TSH normal A1c up to 6.8- good control.   Recommend avoiding medications like tylenol that are hard on the liver.

## 2020-08-28 NOTE — Assessment & Plan Note (Signed)
Labs look great. No changes.  Continue levothyroxine 64mg daily.  Monitor for changes with weight changes as diet and exercise continues.  F/U in 6 months

## 2020-08-28 NOTE — Patient Instructions (Addendum)
Start the Trulicity once a week. After 1 week, stop the metformin.  You will likely need a prior authorization for this, but we will get this pushed through.  I have also sent the free style libre system to the pharmacy for you. This may also need prior authorization. We should have this back by the time your sample runs out. If not, let me know and I will get you another sample.   Let me know how you are doing on this. If you get nausea- let me know and we can trial medication to help with this while your body gets used to it.

## 2020-08-28 NOTE — Assessment & Plan Note (Signed)
BMI down to 35.7 with diet changes and activity.  Starting trulicity today to see if we can get improved control over blood sugars and help with weight changes. Free Style Libre for CGM to be started.  F/U in 3 months

## 2020-08-28 NOTE — Assessment & Plan Note (Signed)
>>  ASSESSMENT AND PLAN FOR TYPE 2 DIABETES MELLITUS WITH COMPLICATIONS ([facility]) WRITTEN ON 08/28/2020  4:54 PM BY Lemon Whitacre E, NP  Recent A1c 6.8% Foot exam today normal.  Plan to start Trulicity  today- samples provided for 2 weeks until PA can be established.  Start CGM with FreeStyle Libre2. Sample provided today and prescription sent to pharmacy.  Continue diet modifications- You are doing a great job! Try to walk 20 minutes every day.  F/U in 3 months

## 2020-08-28 NOTE — Assessment & Plan Note (Signed)
BP well controlled today with no concerns.  Continue benazepril and metoprolol.  F/U in 6 months

## 2020-08-28 NOTE — Assessment & Plan Note (Signed)
Slight elevation in ALT and Alk Phos on recent labs Did recently restart cholesterol medications.  Will recheck in about 3 months to make sure these have not gotten worse.

## 2020-08-31 ENCOUNTER — Other Ambulatory Visit (HOSPITAL_BASED_OUTPATIENT_CLINIC_OR_DEPARTMENT_OTHER): Payer: Self-pay

## 2020-09-11 ENCOUNTER — Other Ambulatory Visit (HOSPITAL_BASED_OUTPATIENT_CLINIC_OR_DEPARTMENT_OTHER): Payer: Self-pay

## 2020-09-11 ENCOUNTER — Other Ambulatory Visit (HOSPITAL_BASED_OUTPATIENT_CLINIC_OR_DEPARTMENT_OTHER): Payer: Self-pay | Admitting: Nurse Practitioner

## 2020-09-11 DIAGNOSIS — I10 Essential (primary) hypertension: Secondary | ICD-10-CM

## 2020-09-11 MED ORDER — BENAZEPRIL HCL 10 MG PO TABS
10.0000 mg | ORAL_TABLET | Freq: Every day | ORAL | 1 refills | Status: DC
  Filled 2020-12-21: qty 30, 30d supply, fill #0
  Filled 2021-01-22: qty 30, 30d supply, fill #1
  Filled 2021-02-22: qty 30, 30d supply, fill #2

## 2020-09-14 ENCOUNTER — Other Ambulatory Visit (HOSPITAL_BASED_OUTPATIENT_CLINIC_OR_DEPARTMENT_OTHER): Payer: Self-pay

## 2020-09-14 ENCOUNTER — Telehealth (HOSPITAL_BASED_OUTPATIENT_CLINIC_OR_DEPARTMENT_OTHER): Payer: Self-pay

## 2020-09-14 DIAGNOSIS — J4540 Moderate persistent asthma, uncomplicated: Secondary | ICD-10-CM

## 2020-09-14 MED ORDER — INCRUSE ELLIPTA 62.5 MCG/INH IN AEPB: 1.0000 | INHALATION_SPRAY | Freq: Every day | RESPIRATORY_TRACT | 5 refills | Status: DC

## 2020-09-14 NOTE — Telephone Encounter (Signed)
Patient requesting refill of inhaler to be sent to [facility] on file

## 2020-09-30 ENCOUNTER — Other Ambulatory Visit (HOSPITAL_COMMUNITY): Payer: Self-pay | Admitting: Cardiology

## 2020-09-30 ENCOUNTER — Ambulatory Visit (HOSPITAL_COMMUNITY)
Admission: RE | Admit: 2020-09-30 | Discharge: 2020-09-30 | Disposition: A | Discharge status: Home / Self Care | Payer: 59 | Source: Ambulatory Visit | Attending: Cardiology | Admitting: Cardiology

## 2020-09-30 ENCOUNTER — Other Ambulatory Visit: Payer: Self-pay

## 2020-09-30 DIAGNOSIS — I6521 Occlusion and stenosis of right carotid artery: Secondary | ICD-10-CM | POA: Insufficient documentation

## 2020-10-01 ENCOUNTER — Encounter: Payer: Self-pay | Admitting: *Deleted

## 2020-11-05 ENCOUNTER — Encounter (HOSPITAL_BASED_OUTPATIENT_CLINIC_OR_DEPARTMENT_OTHER): Payer: Self-pay | Admitting: Nurse Practitioner

## 2020-11-05 ENCOUNTER — Other Ambulatory Visit: Payer: Self-pay

## 2020-11-05 ENCOUNTER — Ambulatory Visit (HOSPITAL_BASED_OUTPATIENT_CLINIC_OR_DEPARTMENT_OTHER): Payer: 59 | Admitting: Nurse Practitioner

## 2020-11-05 VITALS — BP 137/81 | HR 73 | Ht 64.0 in | Wt 207.4 lb

## 2020-11-05 DIAGNOSIS — R140 Abdominal distension (gaseous): Secondary | ICD-10-CM | POA: Diagnosis not present

## 2020-11-05 DIAGNOSIS — R197 Diarrhea, unspecified: Secondary | ICD-10-CM | POA: Insufficient documentation

## 2020-11-05 DIAGNOSIS — K2960 Other gastritis without bleeding: Secondary | ICD-10-CM | POA: Diagnosis not present

## 2020-11-05 HISTORY — DX: Abdominal distension (gaseous): R14.0

## 2020-11-05 MED ORDER — PANTOPRAZOLE SODIUM 40 MG PO TBEC: 40.0000 mg | DELAYED_RELEASE_TABLET | Freq: Two times a day (BID) | ORAL | 1 refills | Status: DC

## 2020-11-05 NOTE — Assessment & Plan Note (Signed)
Intermittent diarrhea with no known infectious cause- no one else in family affected.  Consider possible GLP-1 vs increased fatty food intake vs. Gastritis.  Will review labs today. High dose pantoprazole for 2 weeks.  Will send to GI if treatment not effective.

## 2020-11-05 NOTE — Assessment & Plan Note (Signed)
Abdominal bloating and gas of unknown etiology.  She has had a change in her diet and symptoms appear to be consistent with increased fatty food intake.  Will treat with high dose pantoprazole for 2 weeks then 4 weeks 43m per day.  Labs for further evaluation. Consider possible GLP-1 contribution to symptoms.  Will refer to GI if symptoms do not improve.  No red flags today.

## 2020-11-05 NOTE — Assessment & Plan Note (Signed)
Symptoms consistent with gastritis and reflux- consider possible peptic ulcer or intermittent pancreas irritation. She is on GLP-1 which could be contributing to the problem.  Will treat with high dose pantoprazole x 2 weeks then 39m daily for at least one month. Labs today. Discussed eating small, bland, low fat meals several times a day to avoid overfilling the stomach. May need to consider stopping GLP-1 based on labs and findings.  If symptoms persist, GI referral for further evaluation needed.

## 2020-11-05 NOTE — Progress Notes (Signed)
Acute Office Visit  Subjective:    Patient ID: [name], female    DOB: 1960/08/18, 60 y.o.   MRN: [id number]  Chief Complaint  Patient presents with   digestive system    Patient states she has pancreatitis symptoms: pain felt like fire.  Digestive system is moving slow, but had diarrhea on Friday and Tuesday.  Used pepto for diarrhea.  Patient states she has a lot of gas pain.   [email or web address]  HPI Patient is in today for digestive problems.   Woke up last Friday [name] morning and felt like she needed to throw up but did not. Excessive gas burning up pizza and pain in the liver area. By the afternoon she started having diarrhea, which was continuous. She took Pepto, which eventually helped by the evening. She woke up Saturday with no more diarrhea, but colon felt like it was "spasming". SHe reports her symptoms resolved.  On Tuesday she had scrambled egg, fresh fruit, and water for breakfast and her stomach started hurting with a dull ache. At lunch had rice, chicken, chips, lettuce, tomato, and queso- no spices or sauce. She reports the symptoms improved after eating. Then one hour later she reports "I felt like I was having a heart attack" with fire in her stomach and chest pain. Her HR was 81 and O2 sats were 95. She took a tums and she burped and the chest pains went away.  Symptoms then started again later in the evening. She took a pill for GERD and was able to sleep for about an hour. When she woke up she started having liquid diarrhea again.  Diarrhea has subsided, but she feels like every time she eats she has excessive gas and bloating.   She reports this feels like gallbladder attacks, but she does not have a gallbladder.   She reports her bowel movements have changed recently, but she is not sure if this is due to her diet or medications or something else. She has changed her portion sizes, which has been helpful for her symptoms, but she still feels like her food is  sitting in her stomach and not moving.   Was started on Trulicity in July. She did not have any issues when she started this medication. She did stop the metformin about the same time the symptoms started.   She has not noticed any dark stools, she is urinating well. Yesterday morning she reports her urine was darker than expected, but then lightened throughout the day. No vomiting, fever, or chills.   Past Medical History:  Diagnosis Date   Achilles tendon contracture, right    Acute non-recurrent frontal sinusitis 12/24/2019   ALLERGIC RHINITIS 04/10/2008   Allergic rhinitis 04/10/2008   Qualifier: Diagnosis of  By: Burnice Logan  MD, Doretha Sou    Asthma    asthmatic bronchitis   Breast lump in female    Carpal tunnel syndrome of right wrist    Cellulitis 10/31/2019   Chronic right shoulder pain 05/07/2019   DEPRESSION 08/13/2008   Diabetes mellitus without complication ([place])    Fatigue 11/17/2015   Fatty liver disease, nonalcoholic 0/09/6759   Hepatitis    fatty liver   HYPERLIPIDEMIA 08/04/2006   HYPERTENSION 08/04/2006   HYPOTHYROIDISM 08/04/2006   Kidney injury    LOW BACK PAIN 08/04/2006   Neuromuscular disorder ([place])    Patellofemoral syndrome, right 06/19/2019   PERIMENOPAUSAL SYNDROME 05/08/2008   Plantar fasciitis of right foot 04/12/2017   Plantar fasciitis, left  11/17/2015   Postmenopausal bleeding 07/17/2019   Biopsy and [place] c/w endometrial polyp.  Pt to see Dr. Rosana Hoes for surgical consult.     Primary insomnia 10/31/2019   PVD (peripheral vascular disease) ([place])    occlusive, status post bifem bypass 2007   SOB (shortness of breath) 07/12/2016   Vaginal candidiasis 10/31/2019    Past Surgical History:  Procedure Laterality Date   CARPAL TUNNEL RELEASE Right 04/05/2016   Procedure: OPEN RIGHT CARPAL TUNNEL RELEASE;  Surgeon: Jessy Oto, MD;  Location: [facility];  Service: Orthopedics;  Laterality: Right;   CHOLECYSTECTOMY     FEMORAL BYPASS     bifem.    HAGLAND'S DEFORMITY EXCISION     PLANTAR FASCIA RELEASE Right 04/18/2018   Procedure: PLANTAR FASCIA RELEASE AND GASTROCNEMIUS RECESSION RIGHT;  Surgeon: Newt Minion, MD;  Location: [facility];  Service: Orthopedics;  Laterality: Right;   TUBAL LIGATION      Family History  Problem Relation Age of Onset   Hypertension Mother    Heart disease Father    Hyperlipidemia Father    Hypertension Father    Alcohol abuse Brother    Diabetes Maternal Grandmother    Alcohol abuse Maternal Uncle    Stroke Maternal Uncle    Ovarian cancer Daughter     Social History   Socioeconomic History   Marital status: Married    Spouse name: Not on file   Number of children: 3   Years of education: Not on file   Highest education level: Not on file  Occupational History   Not on file  Tobacco Use   Smoking status: Former    Types: Cigarettes    Quit date: 02/15/2004    Years since quitting: 16.7   Smokeless tobacco: Never  Vaping Use   Vaping Use: Never used  Substance and Sexual Activity   Alcohol use: Yes    Comment: occasional   Drug use: No   Sexual activity: Yes    Birth control/protection: Post-menopausal  Other Topics Concern   Not on file  Social History Narrative   Not on file   Social Determinants of Health   Financial Resource Strain: Not on file  Food Insecurity: Not on file  Transportation Needs: Not on file  Physical Activity: Not on file  Stress: Not on file  Social Connections: Not on file  Intimate Partner Violence: Not on file    Outpatient Medications Prior to Visit  Medication Sig Dispense Refill   albuterol (PROVENTIL) (2.5 MG/3ML) 0.083% nebulizer solution Take 3 mLs (2.5 mg total) by nebulization every 6 (six) hours as needed for wheezing or shortness of breath. 150 mL 1   albuterol (VENTOLIN HFA) 108 (90 Base) MCG/ACT inhaler Inhale 1-2 puffs into the lungs every 4 (four) hours as needed for wheezing or shortness of breath. 8 g 2   ALPRAZolam (XANAX) 0.25 MG  tablet Take 1 tablet (0.25 mg total) by mouth 2 (two) times daily as needed for anxiety. 30 tablet 0   aspirin EC 81 MG tablet Take 1 tablet (81 mg total) by mouth daily. Swallow whole. 90 tablet 3   Azelaic Acid 15 % gel After skin is thoroughly washed and patted dry, gently but thoroughly massage a thin film of azelaic acid cream into the affected area twice daily, in the morning and evening. 50 g 6   azelastine (ASTELIN) 0.1 % nasal spray Place 1 spray into both nostrils daily as needed for rhinitis. [place]  mL 5   B Complex Vitamins (VITAMIN B COMPLEX PO) Take 1 tablet by mouth daily.     benazepril (LOTENSIN) 10 MG tablet Take 1 tablet (10 mg total) by mouth daily. 90 tablet 1   BREO ELLIPTA 100-25 MCG/INH AEPB TAKE 1 PUFF BY MOUTH EVERY DAY 60 each 5   Continuous Blood Gluc Receiver (FREESTYLE LIBRE 2 READER) DEVI Use to monitor blood sugar continuously. AM reading goal: 70-110    2 hours after meal reading goal: less than 180 1 each 0   Continuous Blood Gluc Sensor (FREESTYLE LIBRE 2 SENSOR) MISC Attach one sensor to the arm every 14 days. 2 each 12   desloratadine (CLARINEX) 5 MG tablet Take 1 tablet (5 mg total) by mouth daily. 30 tablet 11   doxycycline (VIBRA-TABS) 100 MG tablet Take 1 tablet (100 mg total) by mouth 2 (two) times daily. 14 tablet 0   Dulaglutide (TRULICITY) 4.15 AX/0.9MM SOPN Inject 0.75 mg into the skin once a week. 2 mL 6   famotidine (PEPCID) 40 MG tablet Take 40 mg by mouth at bedtime.     levothyroxine (SYNTHROID) 88 MCG tablet Take 1 tablet (88 mcg total) by mouth daily. 90 tablet 3   Menthol, Topical Analgesic, (ICY HOT EX) Apply 1 application topically daily as needed (pain).     metoprolol succinate (TOPROL-XL) 100 MG 24 hr tablet Take 1 tablet (100 mg total) by mouth daily. Take with or immediately following a meal. 90 tablet 3   rosuvastatin (CRESTOR) 40 MG tablet Take 1 tablet (40 mg total) by mouth daily. 90 tablet 3   umeclidinium bromide (INCRUSE ELLIPTA) 62.5  MCG/INH AEPB Inhale 1 puff into the lungs daily. 7 each 5   venlafaxine XR (EFFEXOR-XR) 75 MG 24 hr capsule TAKE 3 CAPSULES (225 MG TOTAL) BY MOUTH DAILY WITH BREAKFAST. 270 capsule 2   No facility-administered medications prior to visit.    Allergies  Allergen Reactions   Influenza Virus Vacc Split Pf Swelling and Other (See Comments)    Swelling around injection site   Lipitor [Atorvastatin] Other (See Comments)    Hepatis     Review of Systems All review of systems negative except what is listed in the HPI     Objective:    Physical Exam Vitals and nursing note reviewed.  Constitutional:      Appearance: Normal appearance.  Eyes:     Extraocular Movements: Extraocular movements intact.     Conjunctiva/sclera: Conjunctivae normal.     Pupils: Pupils are equal, round, and reactive to light.  Neck:     Vascular: No carotid bruit.  Cardiovascular:     Rate and Rhythm: Normal rate and regular rhythm.     Pulses: Normal pulses.     Heart sounds: Normal heart sounds.  Pulmonary:     Effort: Pulmonary effort is normal.     Breath sounds: Normal breath sounds.  Abdominal:     General: Bowel sounds are increased. There is no distension or abdominal bruit.     Palpations: Abdomen is soft. There is no mass.     Tenderness: There is abdominal tenderness in the epigastric area. There is no right CVA tenderness, left CVA tenderness, guarding or rebound.     Hernia: No hernia is present.  Musculoskeletal:        General: Normal range of motion.     Cervical back: Normal range of motion.     Right lower leg: No edema.  Left lower leg: No edema.  Skin:    General: Skin is warm and dry.     Capillary Refill: Capillary refill takes less than 2 seconds.  Neurological:     General: No focal deficit present.     Mental Status: She is alert and oriented to person, place, and time.  Psychiatric:        Mood and Affect: Mood normal.        Behavior: Behavior normal.        Thought  Content: Thought content normal.        Judgment: Judgment normal.    BP 137/81   Pulse 73   Ht _0  (1.626 m)   Wt 207 lb 6.4 oz (94.1 kg)   SpO2 98%   BMI 35.60 kg/m  Wt Readings from Last 3 Encounters:  11/05/20 207 lb 6.4 oz (94.1 kg)  08/28/20 208 lb (94.3 kg)  08/04/20 209 lb (94.8 kg)    Health Maintenance Due  Topic Date Due   OPHTHALMOLOGY EXAM  Never done   COLONOSCOPY (Pts 45-41yr Insurance coverage will need to be confirmed)  Never done   Zoster Vaccines- Shingrix (2 of 2) 01/15/2018   COVID-19 Vaccine (4 - Booster for [place]) 04/07/2020    There are no preventive care reminders to display for this patient.   Lab Results  Component Value Date   TSH 2.040 08/26/2020   Lab Results  Component Value Date   WBC 6.0 08/26/2020   HGB 14.8 08/26/2020   HCT 43.0 08/26/2020   MCV 90 08/26/2020   PLT 192 05/17/2019   Lab Results  Component Value Date   NA 138 08/26/2020   K 4.6 08/26/2020   CO2 21 08/26/2020   GLUCOSE 149 (H) 08/26/2020   BUN 15 08/26/2020   CREATININE 0.74 08/26/2020   BILITOT 0.3 08/26/2020   ALKPHOS 134 (H) 08/26/2020   AST 28 08/26/2020   ALT 33 (H) 08/26/2020   PROT 7.5 08/26/2020   ALBUMIN 4.4 08/26/2020   CALCIUM 9.5 08/26/2020   ANIONGAP 11 04/18/2018   EGFR 93 08/26/2020   GFR 74.37 03/25/2013   Lab Results  Component Value Date   CHOL 193 05/17/2019   Lab Results  Component Value Date   HDL 41 (L) 05/17/2019   Lab Results  Component Value Date   LDLCALC 130 (H) 05/17/2019   Lab Results  Component Value Date   TRIG 117 05/17/2019   Lab Results  Component Value Date   CHOLHDL 4.7 05/17/2019   Lab Results  Component Value Date   HGBA1C 6.7 08/26/2020       Assessment & Plan:   Problem List Items Addressed This Visit     Abdominal bloating - Primary    Abdominal bloating and gas of unknown etiology.  She has had a change in her diet and symptoms appear to be consistent with increased fatty food  intake.  Will treat with high dose pantoprazole for 2 weeks then 4 weeks 467mper day.  Labs for further evaluation. Consider possible GLP-1 contribution to symptoms.  Will refer to GI if symptoms do not improve.  No red flags today.       Relevant Medications   pantoprazole (PROTONIX) 40 MG tablet   Other Relevant Orders   Amylase   CBC with Differential/Platelet   Comprehensive metabolic panel   Lipase   Diarrhea    Intermittent diarrhea with no known infectious cause- no one else in family affected.  Consider possible GLP-1 vs increased fatty food intake vs. Gastritis.  Will review labs today. High dose pantoprazole for 2 weeks.  Will send to GI if treatment not effective.       Relevant Medications   pantoprazole (PROTONIX) 40 MG tablet   Other Relevant Orders   Amylase   CBC with Differential/Platelet   Comprehensive metabolic panel   Lipase   Reflux gastritis    Symptoms consistent with gastritis and reflux- consider possible peptic ulcer or intermittent pancreas irritation. She is on GLP-1 which could be contributing to the problem.  Will treat with high dose pantoprazole x 2 weeks then 46m daily for at least one month. Labs today. Discussed eating small, bland, low fat meals several times a day to avoid overfilling the stomach. May need to consider stopping GLP-1 based on labs and findings.  If symptoms persist, GI referral for further evaluation needed.       Relevant Medications   pantoprazole (PROTONIX) 40 MG tablet   Other Relevant Orders   Amylase   CBC with Differential/Platelet   Comprehensive metabolic panel   Lipase     Meds ordered this encounter  Medications   pantoprazole (PROTONIX) 40 MG tablet    Sig: Take 1 tablet (40 mg total) by mouth 2 (two) times daily. For at least the next two weeks.    Dispense:  60 tablet    Refill:  1   F/U: PRN   SOrma Render NP

## 2020-11-05 NOTE — Patient Instructions (Addendum)
Recommendations from today's visit: Eat small portions of bland foods for the next 7 days to see if this will help.  I want you to take Pantoprazole 15m twice a day for the next 14 days- this is for gastric acid and possible ulceration. When you finish with the 14 days, drop down to once a day. If symptoms return, bump back up to twice a day and let me know.  We will check your labs today to check your liver and pancreas.  Let me know if anything changes or if symptoms come back. We may need to get you in with GI for further evaluation.  Try increasing your protein at lunch to prevent blood sugar from bottoming out in the afternoon.  I will let you know if we need to make any additional changes with the lab results.

## 2020-11-06 LAB — COMPREHENSIVE METABOLIC PANEL
ALT: 23 IU/L (ref 0–32)
AST: 25 IU/L (ref 0–40)
Albumin/Globulin Ratio: 1.5 (ref 1.2–2.2)
Albumin: 4.5 g/dL (ref 3.8–4.9)
Alkaline Phosphatase: 139 IU/L — ABNORMAL HIGH (ref 44–121)
BUN/Creatinine Ratio: 11 — ABNORMAL LOW (ref 12–28)
BUN: 10 mg/dL (ref 8–27)
Bilirubin Total: 0.4 mg/dL (ref 0.0–1.2)
CO2: 21 mmol/L (ref 20–29)
Calcium: 9.4 mg/dL (ref 8.7–10.3)
Chloride: 103 mmol/L (ref 96–106)
Creatinine, Ser: 0.94 mg/dL (ref 0.57–1.00)
Globulin, Total: 3 g/dL (ref 1.5–4.5)
Glucose: 119 mg/dL — ABNORMAL HIGH (ref 65–99)
Potassium: 4.3 mmol/L (ref 3.5–5.2)
Sodium: 140 mmol/L (ref 134–144)
Total Protein: 7.5 g/dL (ref 6.0–8.5)
eGFR: 69 mL/min/{1.73_m2} (ref >59)

## 2020-11-06 LAB — CBC WITH DIFFERENTIAL/PLATELET
Basophils Absolute: 0.1 10*3/uL (ref 0.0–0.2)
Basos: 1 %
EOS (ABSOLUTE): 0.9 10*3/uL — ABNORMAL HIGH (ref 0.0–0.4)
Eos: 10 %
Hematocrit: 46.2 % (ref 34.0–46.6)
Hemoglobin: 15.6 g/dL (ref 11.1–15.9)
Immature Grans (Abs): 0 10*3/uL (ref 0.0–0.1)
Immature Granulocytes: 0 %
Lymphocytes Absolute: 3.3 10*3/uL — ABNORMAL HIGH (ref 0.7–3.1)
Lymphs: 38 %
MCH: 30.3 pg (ref 26.6–33.0)
MCHC: 33.8 g/dL (ref 31.5–35.7)
MCV: 90 fL (ref 79–97)
Monocytes Absolute: 0.7 10*3/uL (ref 0.1–0.9)
Monocytes: 8 %
Neutrophils Absolute: 3.8 10*3/uL (ref 1.4–7.0)
Neutrophils: 43 %
Platelets: 170 10*3/uL (ref 150–450)
RBC: 5.15 x10E6/uL (ref 3.77–5.28)
RDW: 12.4 % (ref 11.7–15.4)
WBC: 8.7 10*3/uL (ref 3.4–10.8)

## 2020-11-06 LAB — AMYLASE: Amylase: 41 U/L (ref 31–110)

## 2020-11-06 LAB — LIPASE: Lipase: 32 U/L (ref 14–72)

## 2020-11-10 ENCOUNTER — Encounter (HOSPITAL_BASED_OUTPATIENT_CLINIC_OR_DEPARTMENT_OTHER): Payer: Self-pay | Admitting: Nurse Practitioner

## 2020-11-10 ENCOUNTER — Telehealth (HOSPITAL_BASED_OUTPATIENT_CLINIC_OR_DEPARTMENT_OTHER): Payer: Self-pay

## 2020-11-10 DIAGNOSIS — K2960 Other gastritis without bleeding: Secondary | ICD-10-CM

## 2020-11-10 MED ORDER — PANTOPRAZOLE SODIUM 40 MG PO TBEC: 40.0000 mg | DELAYED_RELEASE_TABLET | Freq: Every day | ORAL | 3 refills | Status: DC

## 2020-11-10 NOTE — Telephone Encounter (Signed)
Results reviewed by patient via [facility].  Seen on 11/09/2020 11:17 AM  Instructed patient to contact the office with any questions or concerns.

## 2020-11-10 NOTE — Telephone Encounter (Signed)
-----   Message from Orma Render, NP sent at 11/09/2020  7:58 AM EDT ----- No indication for abdominal pain coming from pancreas. Overall liver function looks good. Suspect gastroenteritis is the culprit of symptoms. If symptoms are still present, we can consider a GI referral for further evaluation and recommendations.

## 2020-11-26 NOTE — Progress Notes (Signed)
 Established Patient Office Visit  Subjective:  Patient ID: [name], female    DOB: 08/07/1960  Age: 60 y.o. MRN: [id number]  CC: No chief complaint on file.   HPI [name] presents for f/u for DM2.  DIABETES [name] reports that her average blood sugars based on her freestyle libre have been approximately 119 for the past 2 weeks. She is working very hard to monitor her diet and is working to increase her physical activity.  She realizes that her physical activity is lacking and she feels that she has become deconditioned.  She tells me that she plans to begin walking on her lunch break approximately 10 to 15 minutes every day. She tells me she is tolerating her medication very well and is not having any side effects. She is up-to-date on her vaccinations and routine screenings. She is scheduled to have her next COVID booster at the end of this month.  ANXIETY [name] tells me that her anxiety has been worse recently. She does feel that the stressors of work have decreased however she has had an increased stressors at home.   Her husband has not been doing very well physically lately and is in need of surgery which has caused increased stress for her. She has also been working on information that has required her to pull up documents relating to her daughter's death which have been particularly traumatic. She reports that there are times she is unable to determine if her anxiety symptoms are also caused by her asthma exacerbation or if the accident met exacerbation is increasing her anxiety symptoms. She does report that she took a half tab of 0.25 mg Xanax yesterday and felt much better after taking this.  She feels this was very helpful for her and wants to know if it is okay to take this on an as needed basis for the symptoms.  She is still taking her venlafaxine daily.  Past Medical History:  Diagnosis Date   Achilles tendon contracture, right    Acute non-recurrent frontal  sinusitis 12/24/2019   ALLERGIC RHINITIS 04/10/2008   Allergic rhinitis 04/10/2008   Qualifier: Diagnosis of  By: Kwiatkowski  MD, Peter F    Asthma    asthmatic bronchitis   Breast lump in female    Carpal tunnel syndrome of right wrist    Cellulitis 10/31/2019   Chronic right shoulder pain 05/07/2019   DEPRESSION 08/13/2008   Diabetes mellitus without complication (HCC)    Fatigue 11/17/2015   Fatty liver disease, nonalcoholic 03/29/2013   Hepatitis    fatty liver   HYPERLIPIDEMIA 08/04/2006   HYPERTENSION 08/04/2006   HYPOTHYROIDISM 08/04/2006   Kidney injury    LOW BACK PAIN 08/04/2006   Neuromuscular disorder (HCC)    Patellofemoral syndrome, right 06/19/2019   PERIMENOPAUSAL SYNDROME 05/08/2008   Plantar fasciitis of right foot 04/12/2017   Plantar fasciitis, left 11/17/2015   Postmenopausal bleeding 07/17/2019   Biopsy and US c/w endometrial polyp.  Pt to see Dr. Davis for surgical consult.     Primary insomnia 10/31/2019   PVD (peripheral vascular disease) (HCC)    occlusive, status post bifem bypass 2007   SOB (shortness of breath) 07/12/2016   Vaginal candidiasis 10/31/2019    Past Surgical History:  Procedure Laterality Date   CARPAL TUNNEL RELEASE Right 04/05/2016   Procedure: OPEN RIGHT CARPAL TUNNEL RELEASE;  Surgeon: James E Nitka, MD;  Location: [facility];  Service: Orthopedics;  Laterality: Right;     CHOLECYSTECTOMY     FEMORAL BYPASS     bifem.   HAGLAND'S DEFORMITY EXCISION     PLANTAR FASCIA RELEASE Right 04/18/2018   Procedure: PLANTAR FASCIA RELEASE AND GASTROCNEMIUS RECESSION RIGHT;  Surgeon: Duda, Marcus V, MD;  Location: [facility];  Service: Orthopedics;  Laterality: Right;   TUBAL LIGATION      Family History  Problem Relation Age of Onset   Hypertension Mother    Heart disease Father    Hyperlipidemia Father    Hypertension Father    Alcohol abuse Brother    Diabetes Maternal Grandmother    Alcohol abuse Maternal Uncle    Stroke Maternal Uncle     Ovarian cancer Daughter     Social History   Socioeconomic History   Marital status: Married    Spouse name: Not on file   Number of children: 3   Years of education: Not on file   Highest education level: Not on file  Occupational History   Not on file  Tobacco Use   Smoking status: Former    Types: Cigarettes    Quit date: 02/15/2004    Years since quitting: 16.7   Smokeless tobacco: Never  Vaping Use   Vaping Use: Never used  Substance and Sexual Activity   Alcohol use: Yes    Comment: occasional   Drug use: No   Sexual activity: Yes    Birth control/protection: Post-menopausal  Other Topics Concern   Not on file  Social History Narrative   Not on file   Social Determinants of Health   Financial Resource Strain: Not on file  Food Insecurity: Not on file  Transportation Needs: Not on file  Physical Activity: Not on file  Stress: Not on file  Social Connections: Not on file  Intimate Partner Violence: Not on file    Outpatient Medications Prior to Visit  Medication Sig Dispense Refill   albuterol (PROVENTIL) (2.5 MG/3ML) 0.083% nebulizer solution Take 3 mLs (2.5 mg total) by nebulization every 6 (six) hours as needed for wheezing or shortness of breath. 150 mL 1   aspirin EC 81 MG tablet Take 1 tablet (81 mg total) by mouth daily. Swallow whole. 90 tablet 3   Azelaic Acid 15 % gel After skin is thoroughly washed and patted dry, gently but thoroughly massage a thin film of azelaic acid cream into the affected area twice daily, in the morning and evening. 50 g 6   azelastine (ASTELIN) 0.1 % nasal spray Place 1 spray into both nostrils daily as needed for rhinitis. 30 mL 5   B Complex Vitamins (VITAMIN B COMPLEX PO) Take 1 tablet by mouth daily.     benazepril (LOTENSIN) 10 MG tablet Take 1 tablet (10 mg total) by mouth daily. 90 tablet 1   BREO ELLIPTA 100-25 MCG/INH AEPB TAKE 1 PUFF BY MOUTH EVERY DAY 60 each 5   Continuous Blood Gluc Receiver (FREESTYLE LIBRE 2  READER) DEVI Use to monitor blood sugar continuously. AM reading goal: 70-110    2 hours after meal reading goal: less than 180 1 each 0   Continuous Blood Gluc Sensor (FREESTYLE LIBRE 2 SENSOR) MISC Attach one sensor to the arm every 14 days. 2 each 12   desloratadine (CLARINEX) 5 MG tablet Take 1 tablet (5 mg total) by mouth daily. 30 tablet 11   Dulaglutide (TRULICITY) 0.75 MG/0.5ML SOPN Inject 0.75 mg into the skin once a week. 2 mL 6   levothyroxine (SYNTHROID) 88 MCG tablet Take   1 tablet (88 mcg total) by mouth daily. 90 tablet 3   Menthol, Topical Analgesic, (ICY HOT EX) Apply 1 application topically daily as needed (pain).     metoprolol succinate (TOPROL-XL) 100 MG 24 hr tablet Take 1 tablet (100 mg total) by mouth daily. Take with or immediately following a meal. 90 tablet 3   pantoprazole (PROTONIX) 40 MG tablet Take 1 tablet (40 mg total) by mouth daily. 30 tablet 3   rosuvastatin (CRESTOR) 40 MG tablet Take 1 tablet (40 mg total) by mouth daily. 90 tablet 3   umeclidinium bromide (INCRUSE ELLIPTA) 62.5 MCG/INH AEPB Inhale 1 puff into the lungs daily. 7 each 5   venlafaxine XR (EFFEXOR-XR) 75 MG 24 hr capsule TAKE 3 CAPSULES (225 MG TOTAL) BY MOUTH DAILY WITH BREAKFAST. 270 capsule 2   albuterol (VENTOLIN HFA) 108 (90 Base) MCG/ACT inhaler Inhale 1-2 puffs into the lungs every 4 (four) hours as needed for wheezing or shortness of breath. 8 g 2   ALPRAZolam (XANAX) 0.25 MG tablet Take 1 tablet (0.25 mg total) by mouth 2 (two) times daily as needed for anxiety. 30 tablet 0   doxycycline (VIBRA-TABS) 100 MG tablet Take 1 tablet (100 mg total) by mouth 2 (two) times daily. 14 tablet 0   famotidine (PEPCID) 40 MG tablet Take 40 mg by mouth at bedtime.     No facility-administered medications prior to visit.    Allergies  Allergen Reactions   Influenza Virus Vacc Split Pf Swelling and Other (See Comments)    Swelling around injection site   Lipitor [Atorvastatin] Other (See Comments)     Hepatis     ROS Review of Systems All review of systems negative except what is listed in the HPI    Objective:    Physical Exam Vitals and nursing note reviewed.  Constitutional:      Appearance: Normal appearance.  HENT:     Head: Normocephalic.  Eyes:     Extraocular Movements: Extraocular movements intact.     Conjunctiva/sclera: Conjunctivae normal.     Pupils: Pupils are equal, round, and reactive to light.  Neck:     Vascular: No carotid bruit.  Cardiovascular:     Rate and Rhythm: Normal rate and regular rhythm.     Pulses: Normal pulses.     Heart sounds: Normal heart sounds.  Pulmonary:     Effort: Pulmonary effort is normal.     Breath sounds: Normal breath sounds.  Musculoskeletal:     Cervical back: Normal range of motion.     Right lower leg: No edema.     Left lower leg: No edema.  Skin:    General: Skin is warm and dry.     Capillary Refill: Capillary refill takes less than 2 seconds.  Neurological:     General: No focal deficit present.     Mental Status: She is alert and oriented to person, place, and time.  Psychiatric:        Mood and Affect: Mood normal.        Behavior: Behavior normal.        Thought Content: Thought content normal.        Judgment: Judgment normal.    BP 128/73   Pulse 78   Ht 5' 4" (1.626 m)   Wt 212 lb (96.2 kg)   SpO2 100%   BMI 36.39 kg/m  Wt Readings from Last 3 Encounters:  11/27/20 212 lb (96.2 kg)  11/05/20 207 lb 6.4   oz (94.1 kg)  08/28/20 208 lb (94.3 kg)     Health Maintenance Due  Topic Date Due   OPHTHALMOLOGY EXAM  Never done   COLONOSCOPY (Pts 45-100yr Insurance coverage will need to be confirmed)  Never done   Zoster Vaccines- Shingrix (2 of 2) 01/15/2018   COVID-19 Vaccine (4 - Booster for Pfizer series) 04/07/2020    There are no preventive care reminders to display for this patient.  Lab Results  Component Value Date   TSH 2.040 08/26/2020   Lab Results  Component Value Date   WBC  8.7 11/05/2020   HGB 15.6 11/05/2020   HCT 46.2 11/05/2020   MCV 90 11/05/2020   PLT 170 11/05/2020   Lab Results  Component Value Date   NA 140 11/05/2020   K 4.3 11/05/2020   CO2 21 11/05/2020   GLUCOSE 119 (H) 11/05/2020   BUN 10 11/05/2020   CREATININE 0.94 11/05/2020   BILITOT 0.4 11/05/2020   ALKPHOS 139 (H) 11/05/2020   AST 25 11/05/2020   ALT 23 11/05/2020   PROT 7.5 11/05/2020   ALBUMIN 4.5 11/05/2020   CALCIUM 9.4 11/05/2020   ANIONGAP 11 04/18/2018   EGFR 69 11/05/2020   GFR 74.37 03/25/2013   Lab Results  Component Value Date   CHOL 193 05/17/2019   Lab Results  Component Value Date   HDL 41 (L) 05/17/2019   Lab Results  Component Value Date   LDLCALC 130 (H) 05/17/2019   Lab Results  Component Value Date   TRIG 117 05/17/2019   Lab Results  Component Value Date   CHOLHDL 4.7 05/17/2019   Lab Results  Component Value Date   HGBA1C 6.7 08/26/2020      Assessment & Plan:   Problem List Items Addressed This Visit     Type 2 diabetes mellitus with vascular disease ([facility] - Primary    Doing very well with her diabetes control at this time. I feel that the addition of the freestyle lElenor Legatohas been enlightening for her and allowed her to see what triggers increase her blood sugars and how to best manage this condition. Her blood sugars have been very stable. She does report that she has not drink enough water today and is concerned that she will be too dehydrated to give labs today.  We will defer labs until her next visit. Recommend continuing with the freestyle libre at this time.  Link sent to connect with lElenor Legatoview so that I am able to view these readings. No changes in the plan of care today.      Relevant Orders   Hemoglobin A1c   Situational anxiety    Increased anxiety related to current situations at home and reliving events from the passing of her daughter. Asthma exacerbations have also increased her symptoms of anxiety. Recommend  continue venlafaxine. Discussed with the patient that it is okay to continue to take as needed Xanax as needed to help with intermittent symptoms that may occur. She has been excellent about not overusing this medication. We will send refills today      Relevant Medications   ALPRAZolam (XANAX) 0.25 MG tablet   Moderate persistent asthma without complication    Ongoing asthma symptoms.  Overall she is well controlled however does have exacerbations during the fall fairly consistently. It is unclear at this time if her exacerbations are worsening her anxiety or the opposite is true. Recommend continued use of daily medications and inhalers as she has been  Will add PRN low dose xanax to help during times of increased anxiety to help with her symptoms.  She is aware of warning signs that would warrant repeat evaluation.  Will continue to monitor      Relevant Medications   albuterol (VENTOLIN HFA) 108 (90 Base) MCG/ACT inhaler    Meds ordered this encounter  Medications   albuterol (VENTOLIN HFA) 108 (90 Base) MCG/ACT inhaler    Sig: Inhale 1-2 puffs into the lungs every 4 (four) hours as needed for wheezing or shortness of breath.    Dispense:  8.5 g    Refill:  3   ALPRAZolam (XANAX) 0.25 MG tablet    Sig: Take 1 tablet (0.25 mg total) by mouth 2 (two) times daily as needed for anxiety.    Dispense:  60 tablet    Refill:  2    Follow-up: Return in about 4 months (around 03/30/2021) for Diabetes, Asthma.  Time: 30 minutes, >50% spent counseling, care coordination, chart review, and documentation.    Orma Render, NP

## 2020-11-27 ENCOUNTER — Other Ambulatory Visit (HOSPITAL_BASED_OUTPATIENT_CLINIC_OR_DEPARTMENT_OTHER): Payer: Self-pay

## 2020-11-27 ENCOUNTER — Encounter (HOSPITAL_BASED_OUTPATIENT_CLINIC_OR_DEPARTMENT_OTHER): Payer: Self-pay | Admitting: Nurse Practitioner

## 2020-11-27 ENCOUNTER — Other Ambulatory Visit: Payer: Self-pay

## 2020-11-27 ENCOUNTER — Ambulatory Visit (HOSPITAL_BASED_OUTPATIENT_CLINIC_OR_DEPARTMENT_OTHER): Payer: 59 | Admitting: Nurse Practitioner

## 2020-11-27 VITALS — BP 128/73 | HR 78 | Ht 64.0 in | Wt 212.0 lb

## 2020-11-27 DIAGNOSIS — F418 Other specified anxiety disorders: Secondary | ICD-10-CM | POA: Diagnosis not present

## 2020-11-27 DIAGNOSIS — J4540 Moderate persistent asthma, uncomplicated: Secondary | ICD-10-CM | POA: Diagnosis not present

## 2020-11-27 DIAGNOSIS — E1159 Type 2 diabetes mellitus with other circulatory complications: Secondary | ICD-10-CM | POA: Diagnosis not present

## 2020-11-27 MED ORDER — ALPRAZOLAM 0.25 MG PO TABS
0.2500 mg | ORAL_TABLET | Freq: Two times a day (BID) | ORAL | 2 refills | Status: DC | PRN
  Filled 2020-11-27: qty 60, 30d supply, fill #0
  Filled 2021-02-26: qty 60, 30d supply, fill #1

## 2020-11-27 MED ORDER — ALBUTEROL SULFATE HFA 108 (90 BASE) MCG/ACT IN AERS
1.0000 | INHALATION_SPRAY | RESPIRATORY_TRACT | 3 refills | Status: DC | PRN
  Filled 2020-11-27: qty 8.5, 17d supply, fill #0
  Filled 2021-04-06: qty 8.5, 17d supply, fill #1

## 2020-11-27 NOTE — Assessment & Plan Note (Signed)
Doing very well with her diabetes control at this time. I feel that the addition of the freestyle Elenor Legato has been enlightening for her and allowed her to see what triggers increase her blood sugars and how to best manage this condition. Her blood sugars have been very stable. She does report that she has not drink enough water today and is concerned that she will be too dehydrated to give labs today.  We will defer labs until her next visit. Recommend continuing with the freestyle libre at this time.  Link sent to connect with Elenor Legato view so that I am able to view these readings. No changes in the plan of care today.

## 2020-11-27 NOTE — Assessment & Plan Note (Signed)
>>  ASSESSMENT AND PLAN FOR TYPE 2 DIABETES MELLITUS WITH COMPLICATIONS (HCC) WRITTEN ON 11/27/2020  5:20 PM BY Terron Merfeld E, NP  Doing very well with her diabetes control at this time. I feel that the addition of the freestyle herlene has been enlightening for her and allowed her to see what triggers increase her blood sugars and how to best manage this condition. Her blood sugars have been very stable. She does report that she has not drink enough water today and is concerned that she will be too dehydrated to give labs today.  We will defer labs until her next visit. Recommend continuing with the freestyle libre at this time.  Link sent to connect with herlene view so that I am able to view these readings. No changes in the plan of care today.

## 2020-11-27 NOTE — Patient Instructions (Addendum)
I have sent in the refill of xanax for you. Let me know if this is getting worse for you.   Be sure to check your email and link the free style.

## 2020-11-27 NOTE — Assessment & Plan Note (Signed)
Increased anxiety related to current situations at home and reliving events from the passing of her daughter. Asthma exacerbations have also increased her symptoms of anxiety. Recommend continue venlafaxine. Discussed with the patient that it is okay to continue to take as needed Xanax as needed to help with intermittent symptoms that may occur. She has been excellent about not overusing this medication. We will send refills today

## 2020-11-27 NOTE — Assessment & Plan Note (Signed)
Ongoing asthma symptoms.  Overall she is well controlled however does have exacerbations during the fall fairly consistently. It is unclear at this time if her exacerbations are worsening her anxiety or the opposite is true. Recommend continued use of daily medications and inhalers as she has been  Will add PRN low dose xanax to help during times of increased anxiety to help with her symptoms.  She is aware of warning signs that would warrant repeat evaluation.  Will continue to monitor

## 2020-12-07 ENCOUNTER — Other Ambulatory Visit (HOSPITAL_BASED_OUTPATIENT_CLINIC_OR_DEPARTMENT_OTHER): Payer: Self-pay

## 2020-12-07 ENCOUNTER — Other Ambulatory Visit: Payer: Self-pay

## 2020-12-07 ENCOUNTER — Ambulatory Visit: Payer: 59 | Attending: Internal Medicine

## 2020-12-07 DIAGNOSIS — Z23 Encounter for immunization: Secondary | ICD-10-CM

## 2020-12-07 MED ORDER — MODERNA COVID-19 BIVAL BOOSTER 50 MCG/0.5ML IM SUSP
INTRAMUSCULAR | 0 refills | Status: DC
  Filled 2020-12-07: qty 0.5, 1d supply, fill #0

## 2020-12-07 NOTE — Progress Notes (Signed)
   Covid-19 Vaccination Clinic  Name:  [name]    MRN: [id number] DOB: 1960/05/31  12/07/2020  [name] was observed post Covid-19 immunization for 15 minutes without incident. She was provided with Vaccine Information Sheet and instruction to access the V-Safe system.   [name] was instructed to call 911 with any severe reactions post vaccine: Difficulty breathing  Swelling of face and throat  A fast heartbeat  A bad rash all over body  Dizziness and weakness   Immunizations Administered     Name Date Dose VIS Date Route   Moderna Covid-19 vaccine Bivalent Booster 12/07/2020  3:59 PM 0.5 mL 09/26/2020 Intramuscular   Manufacturer: Moderna   Lot: [id number]   [place]: [id number]

## 2020-12-08 ENCOUNTER — Other Ambulatory Visit (HOSPITAL_BASED_OUTPATIENT_CLINIC_OR_DEPARTMENT_OTHER): Payer: Self-pay

## 2020-12-08 MED ORDER — PANTOPRAZOLE SODIUM 40 MG PO TBEC
DELAYED_RELEASE_TABLET | ORAL | 1 refills | Status: DC
  Filled 2020-12-08 – 2021-02-04 (×2): qty 60, 30d supply, fill #0

## 2020-12-08 MED ORDER — FLUTICASONE FUROATE-VILANTEROL 100-25 MCG/ACT IN AEPB
INHALATION_SPRAY | RESPIRATORY_TRACT | 1 refills | Status: DC
  Filled 2021-01-08: qty 60, 30d supply, fill #0

## 2020-12-08 MED ORDER — FLUCONAZOLE 150 MG PO TABS
ORAL_TABLET | ORAL | 0 refills | Status: DC
Start: 2020-05-11 — End: 2021-02-09

## 2020-12-08 MED ORDER — PANTOPRAZOLE SODIUM 40 MG PO TBEC
DELAYED_RELEASE_TABLET | ORAL | 0 refills | Status: DC
  Filled 2020-12-08 – 2021-02-04 (×2): qty 30, 30d supply, fill #0

## 2020-12-08 MED ORDER — UMECLIDINIUM BROMIDE 62.5 MCG/ACT IN AEPB
INHALATION_SPRAY | RESPIRATORY_TRACT | 5 refills | Status: DC
  Filled 2021-02-04: qty 30, 30d supply, fill #0
  Filled 2021-03-15: qty 30, 30d supply, fill #1
  Filled 2021-04-13 – 2021-07-19 (×2): qty 30, 30d supply, fill #2

## 2020-12-08 MED ORDER — FLUCONAZOLE 150 MG PO TABS: ORAL_TABLET | ORAL | 1 refills | Status: DC

## 2020-12-09 ENCOUNTER — Other Ambulatory Visit (HOSPITAL_BASED_OUTPATIENT_CLINIC_OR_DEPARTMENT_OTHER): Payer: Self-pay

## 2020-12-09 MED FILL — Venlafaxine HCl Cap ER 24HR 75 MG (Base Equivalent): ORAL | 30 days supply | Qty: 90 | Fill #0 | Status: AC

## 2020-12-10 ENCOUNTER — Other Ambulatory Visit (HOSPITAL_BASED_OUTPATIENT_CLINIC_OR_DEPARTMENT_OTHER): Payer: Self-pay

## 2020-12-21 ENCOUNTER — Other Ambulatory Visit (HOSPITAL_BASED_OUTPATIENT_CLINIC_OR_DEPARTMENT_OTHER): Payer: Self-pay

## 2020-12-22 ENCOUNTER — Other Ambulatory Visit (HOSPITAL_BASED_OUTPATIENT_CLINIC_OR_DEPARTMENT_OTHER): Payer: Self-pay | Admitting: Nurse Practitioner

## 2020-12-22 ENCOUNTER — Other Ambulatory Visit (HOSPITAL_BASED_OUTPATIENT_CLINIC_OR_DEPARTMENT_OTHER): Payer: Self-pay

## 2020-12-22 DIAGNOSIS — I10 Essential (primary) hypertension: Secondary | ICD-10-CM

## 2020-12-22 MED ORDER — METOPROLOL SUCCINATE ER 100 MG PO TB24
100.0000 mg | ORAL_TABLET | Freq: Every day | ORAL | 3 refills | Status: DC
  Filled 2020-12-22: qty 30, 30d supply, fill #0
  Filled 2021-01-22: qty 30, 30d supply, fill #1
  Filled 2021-02-26: qty 30, 30d supply, fill #2
  Filled 2021-03-24: qty 30, 30d supply, fill #3
  Filled 2021-04-26: qty 30, 30d supply, fill #4
  Filled 2021-05-24: qty 30, 30d supply, fill #5
  Filled 2021-07-23: qty 30, 30d supply, fill #6
  Filled 2021-08-23: qty 30, 30d supply, fill #7
  Filled 2021-09-24: qty 30, 30d supply, fill #8
  Filled 2021-10-29: qty 30, 30d supply, fill #9
  Filled 2021-12-01: qty 30, 30d supply, fill #10

## 2020-12-31 ENCOUNTER — Encounter (HOSPITAL_BASED_OUTPATIENT_CLINIC_OR_DEPARTMENT_OTHER): Payer: Self-pay | Admitting: Family Medicine

## 2020-12-31 ENCOUNTER — Encounter (HOSPITAL_BASED_OUTPATIENT_CLINIC_OR_DEPARTMENT_OTHER): Payer: 59 | Admitting: Family Medicine

## 2020-12-31 ENCOUNTER — Ambulatory Visit (HOSPITAL_BASED_OUTPATIENT_CLINIC_OR_DEPARTMENT_OTHER): Payer: 59 | Admitting: Family Medicine

## 2020-12-31 ENCOUNTER — Other Ambulatory Visit: Payer: Self-pay

## 2020-12-31 DIAGNOSIS — M7541 Impingement syndrome of right shoulder: Secondary | ICD-10-CM | POA: Diagnosis not present

## 2020-12-31 NOTE — Assessment & Plan Note (Signed)
Patient has a history of right shoulder pain for at least the past 3 years.  She has been evaluated by prior sports medicine provider as well as orthopedic surgeon.  She has received various injections as well.  Previously has had x-rays and MRI completed which showed evidence of AC joint arthritis, rotator cuff tendinosis with partial thickness tearing.  She reports that on evaluation with orthopedic surgeon about 1 year ago, she did receive an injection and was advised that she was not a candidate for surgery.  She also completed physical therapy about 1 year ago.  At present she has been having return of symptoms in her right shoulder.  Pain is primarily localized over right shoulder, she also has some right-sided neck pain.  Occasionally some hand pain, however denies any numbness or tingling or radiation of the pain down the arm. Right shoulder: Tenderness to palpation over AC joint, mild Obvious swelling, bruising or erythema: absent Deformity of the shoulder: absent Active ROM: full range with pain Passive ROM: full range with pain Strength: normal/normal Empty can: Positive Hawkins: Negative Neer's: Positive Neurovascular exam: intact  Reviewed imaging with patient including x-rays and MRI results Discussed options including corticosteroid injection, conservative measures including physical therapy, home exercise program Patient elected to proceed with injection today, procedure note above Recommend establishing with physical therapy for local treatment, education regarding home exercise program Plan for follow-up as needed If continuing to have symptoms, can consider alternative target for injections such as AC joint

## 2020-12-31 NOTE — Patient Instructions (Signed)
  Medication Instructions:  Your physician recommends that you continue on your current medications as directed. Please refer to the Current Medication list given to you today. --If you need a refill on any your medications before your next appointment, please call your pharmacy first. If no refills are authorized on file call the office.--  Referrals/Procedures/Imaging: A referral has been placed for you to go to Outpatient Physical Therapy at [facility]. Someone from the scheduling department will be in contact with you in regards to coordinating your consultation. If you do not hear from any of the schedulers within 7-10 business days please give our office a call.  [facility] on the 1st Floor Hours of Operation: Monday - Friday 8:00 am - 5:00 pm Closed on weekends and all major holidays (P) [phone]  Follow-Up: Your next appointment:   Your physician recommends that you schedule a follow-up appointment in: PRN with Dr. de [place]      Thanks for letting us be apart of your health journey!!        [facility]   Dr. Raymond de [place] II., MD, MPH               We recommend signing up for the patient portal called "MyChart".  Sign up information is provided on this After Visit Summary.  MyChart is used to connect with patients for Virtual Visits (Telemedicine).  Patients are able to view lab/test results, encounter notes, upcoming appointments, etc.  Non-urgent messages can be sent to your provider as well.   To learn more about what you can do with MyChart, please visit --  [email or web address].

## 2020-12-31 NOTE — Progress Notes (Signed)
    Procedures performed today:    Procedure: Injection of the right subacromial bursa Verbal informed consent obtained.  Time-out conducted.  Noted no overlying erythema, induration, or other signs of local infection.  Skin prepped in a sterile fashion.  Local anesthesia: Topical Ethyl chloride.  With sterile technique and under real time ultrasound guidance: 1 cc Kenalog 40, 3 cc lidocaine injected easily Completed without difficulty  Advised to call if fevers/chills, erythema, induration, drainage, or persistent bleeding.  Images permanently stored and available for review in PACS.  Impression: Technically successful corticosteroid injection.  Independent interpretation of notes and tests performed by another provider:   None.  Brief History, Exam, Impression, and Recommendations:    Ht 5' 4"  (1.626 m)   Wt 212 lb (96.2 kg)   BMI 36.39 kg/m   Subacromial impingement of right shoulder Patient has a history of right shoulder pain for at least the past 3 years.  She has been evaluated by prior sports medicine provider as well as orthopedic surgeon.  She has received various injections as well.  Previously has had x-rays and MRI completed which showed evidence of AC joint arthritis, rotator cuff tendinosis with partial thickness tearing.  She reports that on evaluation with orthopedic surgeon about 1 year ago, she did receive an injection and was advised that she was not a candidate for surgery.  She also completed physical therapy about 1 year ago.  At present she has been having return of symptoms in her right shoulder.  Pain is primarily localized over right shoulder, she also has some right-sided neck pain.  Occasionally some hand pain, however denies any numbness or tingling or radiation of the pain down the arm. Right shoulder: Tenderness to palpation over AC joint, mild Obvious swelling, bruising or erythema: absent Deformity of the shoulder: absent Active ROM: full range with  pain Passive ROM: full range with pain Strength: normal/normal Empty can: Positive Hawkins: Negative Neer's: Positive Neurovascular exam: intact  Reviewed imaging with patient including x-rays and MRI results Discussed options including corticosteroid injection, conservative measures including physical therapy, home exercise program Patient elected to proceed with injection today, procedure note above Recommend establishing with physical therapy for local treatment, education regarding home exercise program Plan for follow-up as needed If continuing to have symptoms, can consider alternative target for injections such as AC joint   ___________________________________________ Adit Riddles de [place], MD, ABFM, CAQSM [facility] [place]

## 2021-01-04 ENCOUNTER — Encounter (HOSPITAL_BASED_OUTPATIENT_CLINIC_OR_DEPARTMENT_OTHER): Payer: Self-pay | Admitting: Pharmacist

## 2021-01-04 ENCOUNTER — Other Ambulatory Visit (HOSPITAL_BASED_OUTPATIENT_CLINIC_OR_DEPARTMENT_OTHER): Payer: Self-pay

## 2021-01-05 ENCOUNTER — Other Ambulatory Visit (HOSPITAL_BASED_OUTPATIENT_CLINIC_OR_DEPARTMENT_OTHER): Payer: Self-pay

## 2021-01-08 ENCOUNTER — Other Ambulatory Visit (HOSPITAL_BASED_OUTPATIENT_CLINIC_OR_DEPARTMENT_OTHER): Payer: Self-pay

## 2021-01-12 ENCOUNTER — Other Ambulatory Visit (HOSPITAL_BASED_OUTPATIENT_CLINIC_OR_DEPARTMENT_OTHER): Payer: Self-pay

## 2021-01-13 ENCOUNTER — Other Ambulatory Visit: Payer: Self-pay | Admitting: Sports Medicine

## 2021-01-13 DIAGNOSIS — F321 Major depressive disorder, single episode, moderate: Secondary | ICD-10-CM

## 2021-01-14 ENCOUNTER — Other Ambulatory Visit: Payer: Self-pay | Admitting: Nurse Practitioner

## 2021-01-14 ENCOUNTER — Encounter (HOSPITAL_BASED_OUTPATIENT_CLINIC_OR_DEPARTMENT_OTHER): Payer: Self-pay | Admitting: Nurse Practitioner

## 2021-01-14 ENCOUNTER — Other Ambulatory Visit (HOSPITAL_BASED_OUTPATIENT_CLINIC_OR_DEPARTMENT_OTHER): Payer: Self-pay

## 2021-01-14 ENCOUNTER — Other Ambulatory Visit (HOSPITAL_BASED_OUTPATIENT_CLINIC_OR_DEPARTMENT_OTHER): Payer: Self-pay | Admitting: Nurse Practitioner

## 2021-01-14 DIAGNOSIS — F321 Major depressive disorder, single episode, moderate: Secondary | ICD-10-CM

## 2021-01-14 MED ORDER — VENLAFAXINE HCL ER 75 MG PO CP24
225.0000 mg | ORAL_CAPSULE | Freq: Every day | ORAL | 3 refills | Status: DC
  Filled 2021-01-14: qty 90, 30d supply, fill #0
  Filled 2021-02-12: qty 90, 30d supply, fill #1
  Filled 2021-03-15: qty 90, 30d supply, fill #2
  Filled 2021-04-13: qty 90, 30d supply, fill #3
  Filled 2021-05-24: qty 90, 30d supply, fill #4
  Filled 2021-07-04: qty 90, 30d supply, fill #5
  Filled 2021-07-30: qty 90, 30d supply, fill #6
  Filled 2021-09-09: qty 90, 30d supply, fill #7
  Filled 2021-10-14: qty 90, 30d supply, fill #8
  Filled 2021-11-08: qty 90, 30d supply, fill #9
  Filled 2021-12-21: qty 90, 30d supply, fill #10

## 2021-01-22 ENCOUNTER — Other Ambulatory Visit (HOSPITAL_BASED_OUTPATIENT_CLINIC_OR_DEPARTMENT_OTHER): Payer: Self-pay

## 2021-01-22 ENCOUNTER — Other Ambulatory Visit: Payer: Self-pay | Admitting: Nurse Practitioner

## 2021-01-22 MED ORDER — LEVOTHYROXINE SODIUM 88 MCG PO TABS
88.0000 ug | ORAL_TABLET | Freq: Every day | ORAL | 2 refills | Status: DC
  Filled 2021-01-22: qty 30, 30d supply, fill #0
  Filled 2021-02-26: qty 30, 30d supply, fill #1
  Filled 2021-03-24: qty 30, 30d supply, fill #2

## 2021-01-25 ENCOUNTER — Other Ambulatory Visit (HOSPITAL_BASED_OUTPATIENT_CLINIC_OR_DEPARTMENT_OTHER): Payer: Self-pay

## 2021-01-25 ENCOUNTER — Ambulatory Visit (HOSPITAL_BASED_OUTPATIENT_CLINIC_OR_DEPARTMENT_OTHER): Payer: 59 | Admitting: Family Medicine

## 2021-01-25 ENCOUNTER — Encounter (HOSPITAL_BASED_OUTPATIENT_CLINIC_OR_DEPARTMENT_OTHER): Payer: Self-pay | Admitting: Family Medicine

## 2021-01-25 ENCOUNTER — Other Ambulatory Visit: Payer: Self-pay

## 2021-01-25 DIAGNOSIS — B029 Zoster without complications: Secondary | ICD-10-CM | POA: Diagnosis not present

## 2021-01-25 MED ORDER — VALACYCLOVIR HCL 1 G PO TABS
1000.0000 mg | ORAL_TABLET | Freq: Three times a day (TID) | ORAL | 0 refills | Status: AC
  Filled 2021-01-25: qty 21, 7d supply, fill #0

## 2021-01-25 NOTE — Assessment & Plan Note (Signed)
Patient presents for an acute visit for rash on face.  Reports that about 6 days ago she noticed small pustule on right cheek.  This eventually popped overnight, she did not specifically see what the drainage appeared like.  She had subsequent lesions develop in the area.  Does have some pain/burning associated intermittently.  She denies any vision issues, blurry vision, hearing changes, ear pain.  Reports that she did have shingles vaccine in the past, however only received 1 of 2 doses. On exam, there is areas of crusted lesions over right cheek.  Small vesicle more approximately in the area of TMJ which appears to be a separate process.  No active drainage of crusted lesions. Appearance does seem suggestive of possible herpes zoster rash.  Discussed options with the patient, will proceed with antiviral therapy with valacyclovir 1000 mg 3 times daily for 7 days. Will plan for follow-up in 1 to 2 weeks to monitor response to treatment, this will be with myself or with her PCP

## 2021-01-25 NOTE — Patient Instructions (Addendum)
  Medication Instructions:  Your physician has recommended you make the following change in your medication:  -- START Valacyclovir 1000 mg - Take 1 tablet three times daily for 1 week --If you need a refill on any your medications before your next appointment, please call your pharmacy first. If no refills are authorized on file call the office.-- Follow-Up: Your next appointment:   Your physician recommends that you schedule a follow-up appointment in: 2 WEEKS with Dr. de [place]  You will receive a text message or e-mail with a link to a survey about your care and experience with [place] today! We would greatly appreciate your feedback!   Thanks for letting us be apart of your health journey!!  Primary Care and Sports Medicine   Dr. Arlina Robes [place]   We encourage you to activate your patient portal called "MyChart".  Sign up information is provided on this After Visit Summary.  MyChart is used to connect with patients for Virtual Visits (Telemedicine).  Patients are able to view lab/test results, encounter notes, upcoming appointments, etc.  Non-urgent messages can be sent to your provider as well. To learn more about what you can do with MyChart, please visit --  [email or web address].

## 2021-01-25 NOTE — Progress Notes (Signed)
    Procedures performed today:    None.  Independent interpretation of notes and tests performed by another provider:   None.  Brief History, Exam, Impression, and Recommendations:    BP 114/72   Pulse 78   Ht 5' 4"  (1.626 m)   Wt 219 lb (99.3 kg)   SpO2 99%   BMI 37.59 kg/m   Herpes zoster Patient presents for an acute visit for rash on face.  Reports that about 6 days ago she noticed small pustule on right cheek.  This eventually popped overnight, she did not specifically see what the drainage appeared like.  She had subsequent lesions develop in the area.  Does have some pain/burning associated intermittently.  She denies any vision issues, blurry vision, hearing changes, ear pain.  Reports that she did have shingles vaccine in the past, however only received 1 of 2 doses. On exam, there is areas of crusted lesions over right cheek.  Small vesicle more approximately in the area of TMJ which appears to be a separate process.  No active drainage of crusted lesions. Appearance does seem suggestive of possible herpes zoster rash.  Discussed options with the patient, will proceed with antiviral therapy with valacyclovir 1000 mg 3 times daily for 7 days. Will plan for follow-up in 1 to 2 weeks to monitor response to treatment, this will be with myself or with her PCP   ___________________________________________ Jakera Beaupre de [place], MD, ABFM, CAQSM Primary Care and [place]

## 2021-01-26 ENCOUNTER — Encounter (HOSPITAL_BASED_OUTPATIENT_CLINIC_OR_DEPARTMENT_OTHER): Payer: Self-pay | Admitting: Family Medicine

## 2021-02-04 ENCOUNTER — Other Ambulatory Visit (HOSPITAL_BASED_OUTPATIENT_CLINIC_OR_DEPARTMENT_OTHER): Payer: Self-pay

## 2021-02-05 ENCOUNTER — Other Ambulatory Visit (HOSPITAL_BASED_OUTPATIENT_CLINIC_OR_DEPARTMENT_OTHER): Payer: Self-pay

## 2021-02-09 ENCOUNTER — Encounter (HOSPITAL_BASED_OUTPATIENT_CLINIC_OR_DEPARTMENT_OTHER): Payer: Self-pay | Admitting: Family Medicine

## 2021-02-09 ENCOUNTER — Other Ambulatory Visit: Payer: Self-pay

## 2021-02-09 ENCOUNTER — Ambulatory Visit (INDEPENDENT_AMBULATORY_CARE_PROVIDER_SITE_OTHER): Payer: 59 | Admitting: Family Medicine

## 2021-02-09 VITALS — Ht 64.0 in | Wt 219.0 lb

## 2021-02-09 DIAGNOSIS — B029 Zoster without complications: Secondary | ICD-10-CM

## 2021-02-09 DIAGNOSIS — M79644 Pain in right finger(s): Secondary | ICD-10-CM | POA: Insufficient documentation

## 2021-02-09 DIAGNOSIS — M7541 Impingement syndrome of right shoulder: Secondary | ICD-10-CM | POA: Diagnosis not present

## 2021-02-09 HISTORY — DX: Pain in right finger(s): M79.644

## 2021-02-09 NOTE — Assessment & Plan Note (Addendum)
Has been having more pain over the past couple weeks Her husband has had limited activity due to recent surgery on his hand/finger Pain mostly over MCP joint Discussed options with patient, will continue to monitor symptoms and use conservative measures at this time If symptoms not settling down as expected, likely proceed with initial x-ray imaging and further interventions depending on results of imaging

## 2021-02-09 NOTE — Progress Notes (Signed)
° ° °  Procedures performed today:    None.  Independent interpretation of notes and tests performed by another provider:   None.  Brief History, Exam, Impression, and Recommendations:    Ht 5' 4"  (1.626 m)    Wt 219 lb (99.3 kg)    BMI 37.59 kg/m   Herpes zoster Completed course of antiviral medication Reports that rash has improved.  She indicates that she did have some intranasal/sinus symptoms to with reported scabs/clots being blown out. On exam, mild scaling over right cheek, no significant erythema Recommend continuing with monitoring at this time, expectant management  Pain of finger of right hand Has been having more pain over the past couple weeks Her husband has had limited activity due to recent surgery on his hand/finger Pain mostly over MCP joint Discussed options with patient, will continue to monitor symptoms and use conservative measures at this time If symptoms not settling down as expected, likely proceed with initial x-ray imaging and further interventions depending on results of imaging  Subacromial impingement of right shoulder Injection at visit in Nov 2022 - feels that this fully improved symptoms at that time, has had some return of symptoms Has not done any PT since the injection - reports that she has not heard from PT office since referral Given injection was a little over 1 month ago, but would not repeat as of yet.  She is indicating that she is still getting good relief from recent injection I do recommend proceeding with PT, working on home exercise program as per PT Information to contact PT office provided today for patient to reach out to them about arranging establishing visit Plan for follow-up as needed regarding this, did discuss that earliest time that we would consider performing repeat injection would be about 3 months after last injection   ___________________________________________ Katherine Ledet de [place], MD, ABFM, CAQSM Primary Care and  [place]

## 2021-02-09 NOTE — Assessment & Plan Note (Signed)
Injection at visit in Nov 2022 - feels that this fully improved symptoms at that time, has had some return of symptoms Has not done any PT since the injection - reports that she has not heard from PT office since referral Given injection was a little over 1 month ago, but would not repeat as of yet.  She is indicating that she is still getting good relief from recent injection I do recommend proceeding with PT, working on home exercise program as per PT Information to contact PT office provided today for patient to reach out to them about arranging establishing visit Plan for follow-up as needed regarding this, did discuss that earliest time that we would consider performing repeat injection would be about 3 months after last injection

## 2021-02-09 NOTE — Assessment & Plan Note (Addendum)
Completed course of antiviral medication Reports that rash has improved.  She indicates that she did have some intranasal/sinus symptoms to with reported scabs/clots being blown out. On exam, mild scaling over right cheek, no significant erythema Recommend continuing with monitoring at this time, expectant management

## 2021-02-09 NOTE — Patient Instructions (Signed)
°  Medication Instructions:  Your physician recommends that you continue on your current medications as directed. Please refer to the Current Medication list given to you today. --If you need a refill on any your medications before your next appointment, please call your pharmacy first. If no refills are authorized on file call the office.--  A referral has been placed for you to go to Outpatient Physical Therapy at [facility]. Someone from the scheduling department will be in contact with you in regards to coordinating your consultation. If you do not hear from any of the schedulers within 7-10 business days please give our office a call.  [facility] on the 1st Floor Hours of Operation: Monday - Friday 8:00 am - 5:00 pm Closed on weekends and all major holidays (P) [phone]  Follow-Up: Your next appointment:   Your physician recommends that you schedule a follow-up appointment in: AS NEEDED with Dr. de [place]      Thanks for letting us be apart of your health journey!!        [facility]   Dr. Raymond de [place] II., MD, MPH               We recommend signing up for the patient portal called "MyChart".  Sign up information is provided on this After Visit Summary.  MyChart is used to connect with patients for Virtual Visits (Telemedicine).  Patients are able to view lab/test results, encounter notes, upcoming appointments, etc.  Non-urgent messages can be sent to your provider as well.   To learn more about what you can do with MyChart, please visit --  [email or web address].

## 2021-02-12 ENCOUNTER — Other Ambulatory Visit: Payer: Self-pay | Admitting: Nurse Practitioner

## 2021-02-12 ENCOUNTER — Other Ambulatory Visit (HOSPITAL_BASED_OUTPATIENT_CLINIC_OR_DEPARTMENT_OTHER): Payer: Self-pay

## 2021-02-12 DIAGNOSIS — J0101 Acute recurrent maxillary sinusitis: Secondary | ICD-10-CM

## 2021-02-12 MED ORDER — DESLORATADINE 5 MG PO TABS
5.0000 mg | ORAL_TABLET | Freq: Every day | ORAL | 11 refills | Status: DC
  Filled 2021-02-12: qty 30, 30d supply, fill #0
  Filled 2021-03-15: qty 30, 30d supply, fill #1
  Filled 2021-05-18: qty 30, 30d supply, fill #2
  Filled 2021-06-15: qty 30, 30d supply, fill #3
  Filled 2021-07-19: qty 30, 30d supply, fill #4
  Filled 2021-08-27: qty 30, 30d supply, fill #5
  Filled 2021-09-27: qty 30, 30d supply, fill #6
  Filled 2021-11-15: qty 30, 30d supply, fill #7
  Filled 2021-12-21: qty 20, 20d supply, fill #8
  Filled 2021-12-21: qty 10, 10d supply, fill #8
  Filled 2022-01-28: qty 30, 30d supply, fill #9

## 2021-02-22 ENCOUNTER — Other Ambulatory Visit (HOSPITAL_BASED_OUTPATIENT_CLINIC_OR_DEPARTMENT_OTHER): Payer: Self-pay

## 2021-02-26 ENCOUNTER — Other Ambulatory Visit (HOSPITAL_BASED_OUTPATIENT_CLINIC_OR_DEPARTMENT_OTHER): Payer: Self-pay

## 2021-03-01 ENCOUNTER — Other Ambulatory Visit (HOSPITAL_BASED_OUTPATIENT_CLINIC_OR_DEPARTMENT_OTHER): Payer: Self-pay

## 2021-03-03 IMAGING — MG MM DIGITAL DIAGNOSTIC UNILAT*R* W/ TOMO W/ CAD
4 series · 4 of 12 positions shown · non-contrast
Comparison: Previous exam(s).

CLINICAL DATA: Patient recalled from screening for right breast
mass.

EXAM:
DIGITAL DIAGNOSTIC RIGHT MAMMOGRAM WITH CAD AND TOMO
ULTRASOUND RIGHT BREAST

[R CC synth-2D]
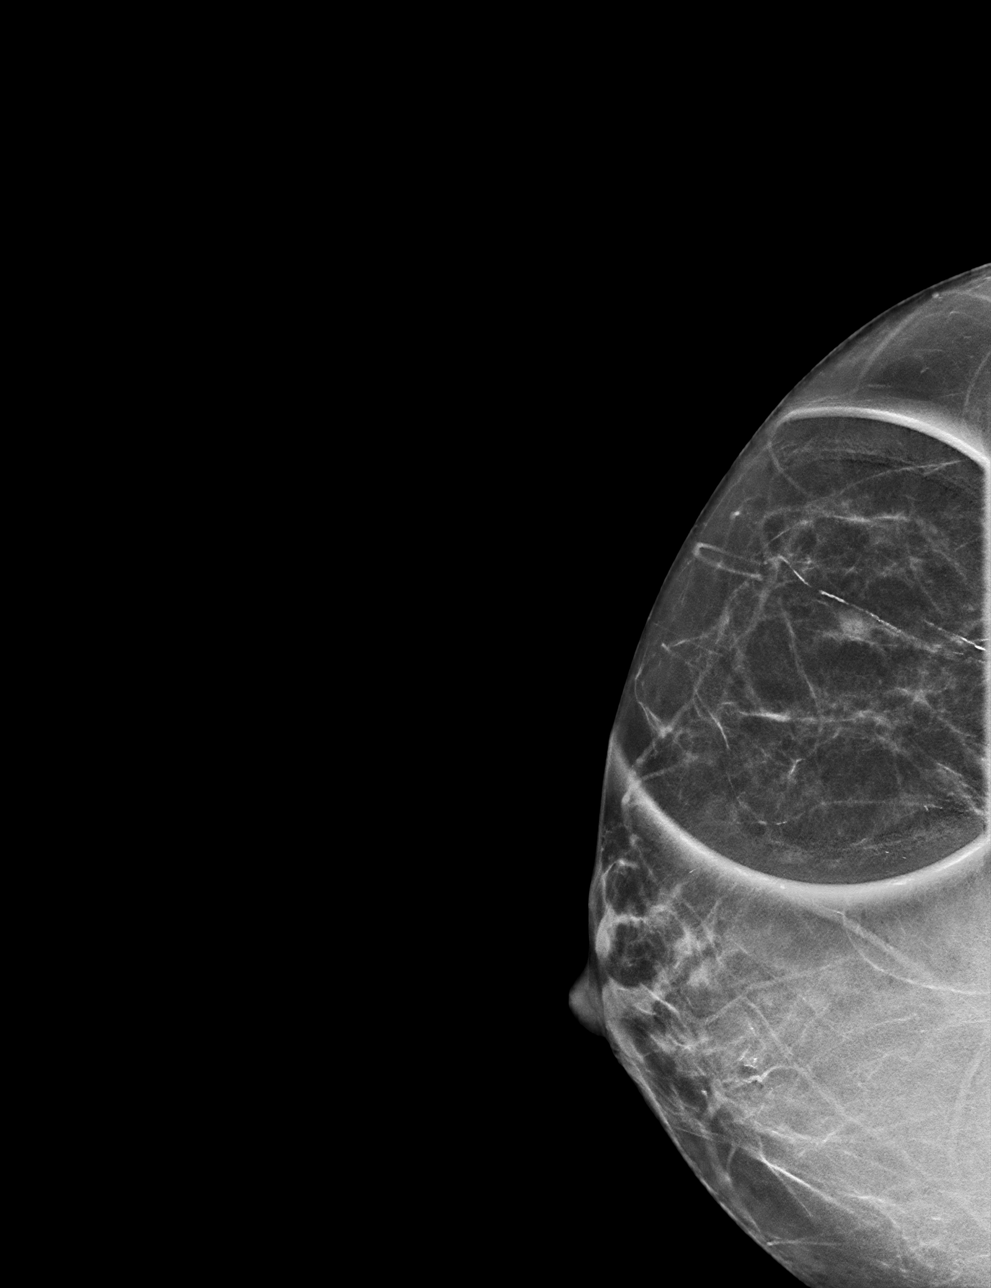

[R ML synth-2D]
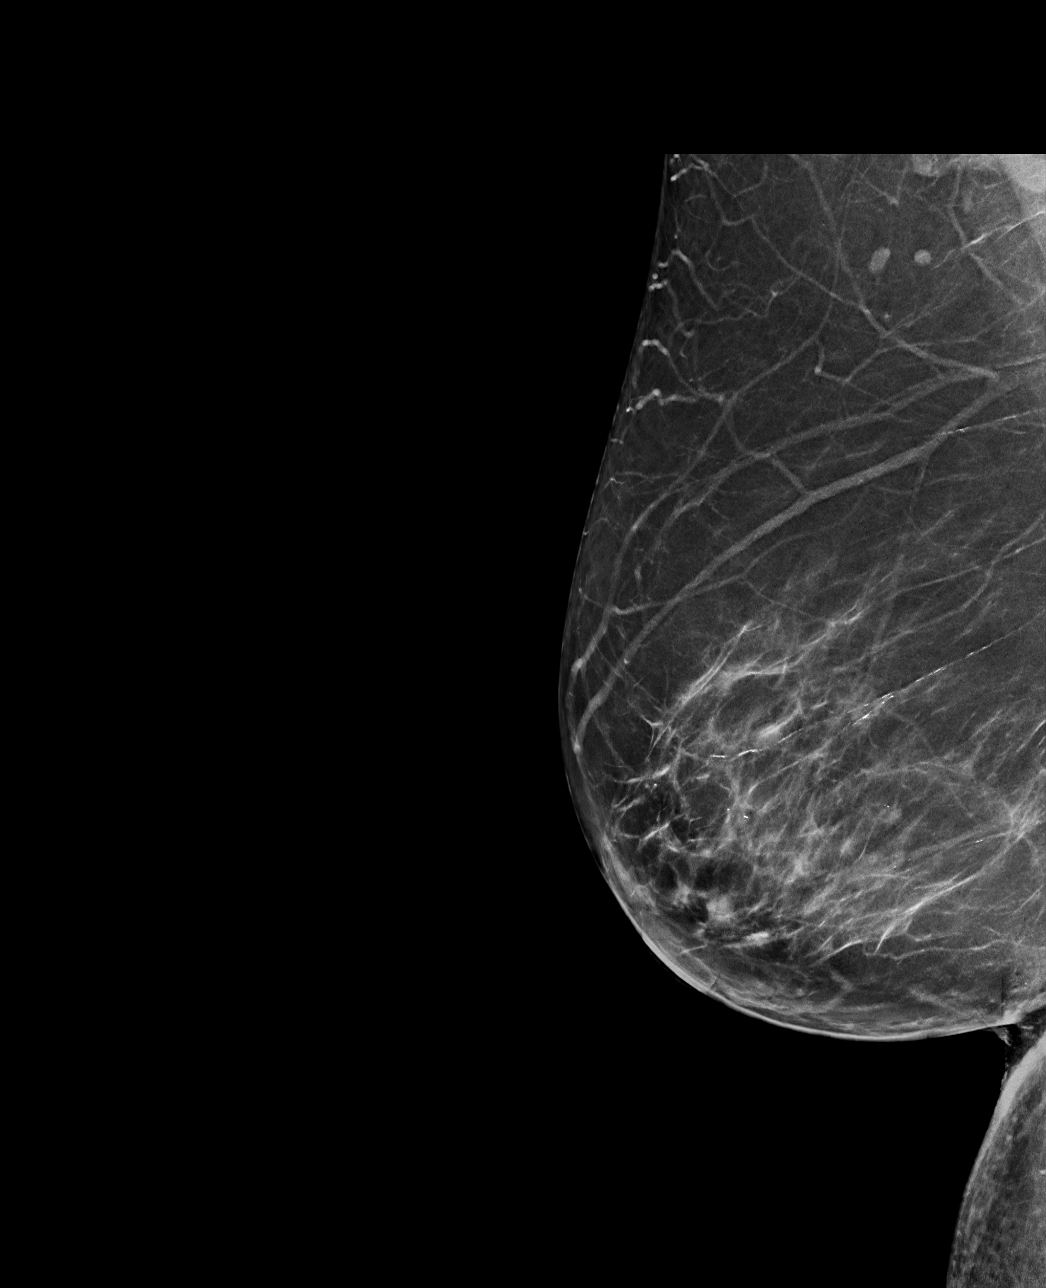

[R CC tomo · tomo slice 27/52.0]
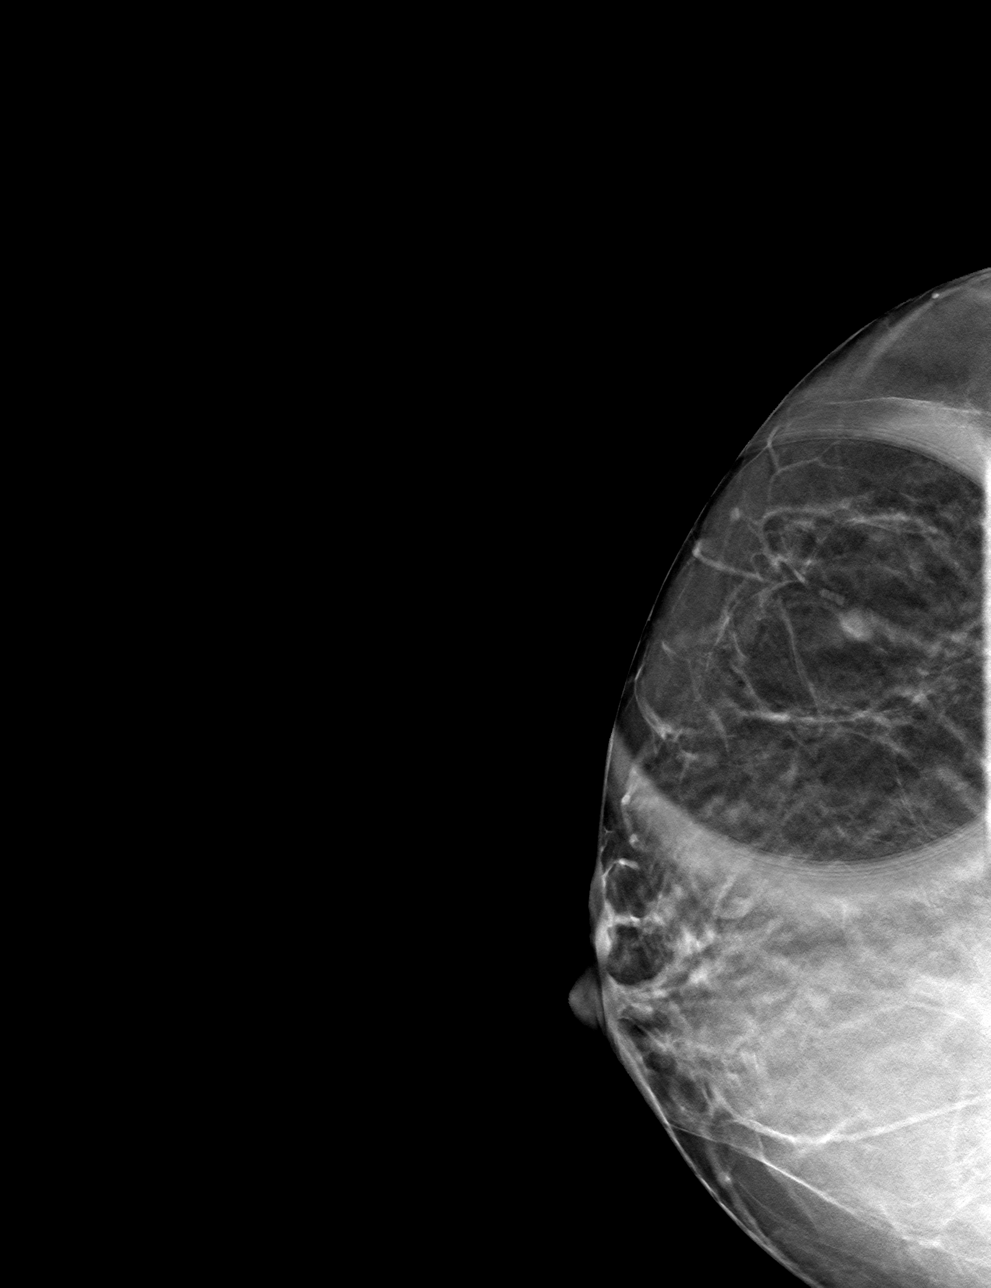

[R ML tomo · tomo slice 42/83.0]
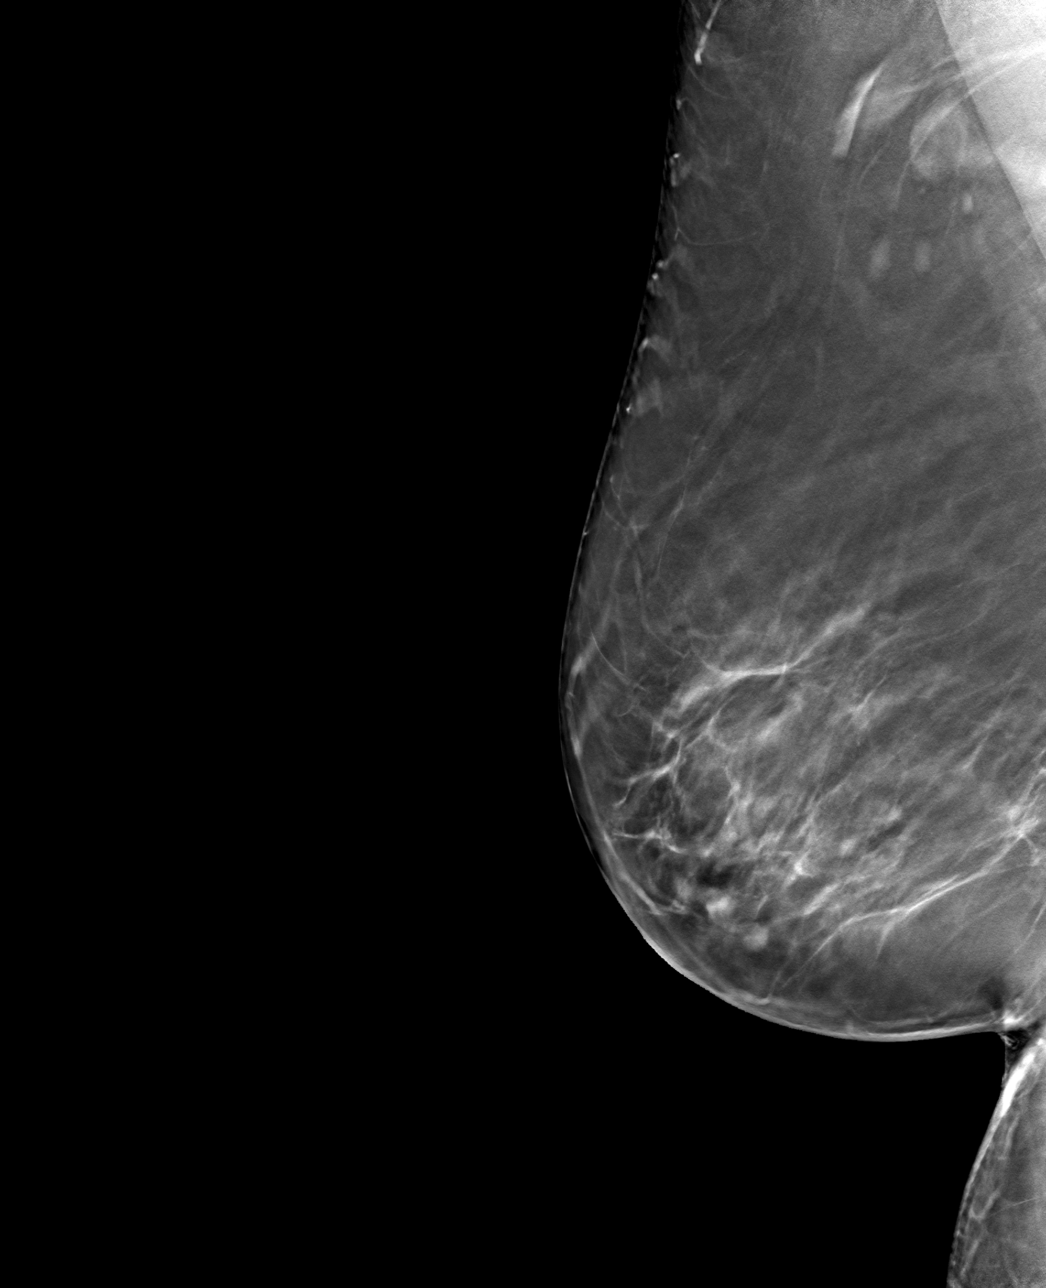

[4 of 12 positions shown; findings below may reference images not displayed]

ACR Breast Density Category b: There are scattered areas of
fibroglandular density.
FINDINGS: Persistent oval circumscribed mass within the right breast laterally
middle depth, evaluated with spot compression cc and full paddle
true lateral views.

Mammographic images were processed with CAD.

Targeted ultrasound is performed, showing a 5 x 4 x 7 mm cyst right
breast 9 o'clock position 5 cm from nipple.
IMPRESSION: Right breast cyst.

No mammographic evidence for malignancy.

RECOMMENDATION:
Screening mammogram in one year.(Code:[id number])

I have discussed the findings and recommendations with the patient.
If applicable, a reminder letter will be sent to the patient
regarding the next appointment.

BI-RADS CATEGORY  2: Benign.

## 2021-03-04 ENCOUNTER — Other Ambulatory Visit (HOSPITAL_BASED_OUTPATIENT_CLINIC_OR_DEPARTMENT_OTHER): Payer: Self-pay

## 2021-03-04 ENCOUNTER — Other Ambulatory Visit (HOSPITAL_BASED_OUTPATIENT_CLINIC_OR_DEPARTMENT_OTHER): Payer: Self-pay | Admitting: Nurse Practitioner

## 2021-03-04 ENCOUNTER — Encounter: Payer: Self-pay | Admitting: Specialist

## 2021-03-04 DIAGNOSIS — K2960 Other gastritis without bleeding: Secondary | ICD-10-CM

## 2021-03-05 ENCOUNTER — Other Ambulatory Visit (HOSPITAL_BASED_OUTPATIENT_CLINIC_OR_DEPARTMENT_OTHER): Payer: Self-pay

## 2021-03-05 MED FILL — Pantoprazole Sodium EC Tab 40 MG (Base Equiv): ORAL | 90 days supply | Qty: 90 | Fill #0 | Status: AC

## 2021-03-09 DIAGNOSIS — M19011 Primary osteoarthritis, right shoulder: Secondary | ICD-10-CM | POA: Insufficient documentation

## 2021-03-15 ENCOUNTER — Other Ambulatory Visit (HOSPITAL_BASED_OUTPATIENT_CLINIC_OR_DEPARTMENT_OTHER): Payer: Self-pay

## 2021-03-15 ENCOUNTER — Other Ambulatory Visit (HOSPITAL_BASED_OUTPATIENT_CLINIC_OR_DEPARTMENT_OTHER): Payer: Self-pay | Admitting: Nurse Practitioner

## 2021-03-15 DIAGNOSIS — E1159 Type 2 diabetes mellitus with other circulatory complications: Secondary | ICD-10-CM

## 2021-03-23 ENCOUNTER — Other Ambulatory Visit: Payer: Self-pay

## 2021-03-23 ENCOUNTER — Other Ambulatory Visit (HOSPITAL_BASED_OUTPATIENT_CLINIC_OR_DEPARTMENT_OTHER): Payer: Self-pay

## 2021-03-23 ENCOUNTER — Ambulatory Visit (HOSPITAL_BASED_OUTPATIENT_CLINIC_OR_DEPARTMENT_OTHER): Payer: 59 | Admitting: Nurse Practitioner

## 2021-03-23 ENCOUNTER — Encounter (HOSPITAL_BASED_OUTPATIENT_CLINIC_OR_DEPARTMENT_OTHER): Payer: Self-pay | Admitting: Nurse Practitioner

## 2021-03-23 VITALS — BP 128/82 | HR 73 | Ht 64.0 in | Wt 220.8 lb

## 2021-03-23 DIAGNOSIS — Z6836 Body mass index (BMI) 36.0-36.9, adult: Secondary | ICD-10-CM

## 2021-03-23 DIAGNOSIS — I739 Peripheral vascular disease, unspecified: Secondary | ICD-10-CM

## 2021-03-23 DIAGNOSIS — E890 Postprocedural hypothyroidism: Secondary | ICD-10-CM | POA: Diagnosis not present

## 2021-03-23 DIAGNOSIS — K7581 Nonalcoholic steatohepatitis (NASH): Secondary | ICD-10-CM | POA: Diagnosis not present

## 2021-03-23 DIAGNOSIS — I10 Essential (primary) hypertension: Secondary | ICD-10-CM | POA: Diagnosis not present

## 2021-03-23 DIAGNOSIS — J4540 Moderate persistent asthma, uncomplicated: Secondary | ICD-10-CM

## 2021-03-23 DIAGNOSIS — E1159 Type 2 diabetes mellitus with other circulatory complications: Secondary | ICD-10-CM

## 2021-03-23 DIAGNOSIS — J0140 Acute pansinusitis, unspecified: Secondary | ICD-10-CM

## 2021-03-23 MED ORDER — AMOXICILLIN-POT CLAVULANATE 875-125 MG PO TABS
1.0000 | ORAL_TABLET | Freq: Two times a day (BID) | ORAL | 0 refills | Status: DC
  Filled 2021-03-23: qty 20, 10d supply, fill #0

## 2021-03-23 MED ORDER — FLUTICASONE FUROATE-VILANTEROL 100-25 MCG/ACT IN AEPB
INHALATION_SPRAY | RESPIRATORY_TRACT | 11 refills | Status: DC
  Filled 2021-03-23: qty 60, 30d supply, fill #0
  Filled 2021-05-24: qty 60, 30d supply, fill #1
  Filled 2021-10-25: qty 60, 30d supply, fill #2
  Filled 2022-01-28: qty 60, 30d supply, fill #3

## 2021-03-23 NOTE — Assessment & Plan Note (Signed)
Well-controlled today. No acute signs or symptoms present at this time. Given patient's history of recurrent infections will manage sinusitis aggressively. Patient aware to use her medication daily as a preventative maintenance.  We will plan to follow-up in 3 months or sooner if needed.

## 2021-03-23 NOTE — Assessment & Plan Note (Signed)
We will obtain labs today. No changes to medication at this time. We will make changes to plan of care based on lab results as necessary.

## 2021-03-23 NOTE — Assessment & Plan Note (Signed)
No signs of lower extremity edema. We will continue to monitor and manage blood pressure aggressively as well as risk factors including diabetes and hyperlipidemia. Patient aware to contact the office if symptoms worsen or fail to improve.

## 2021-03-23 NOTE — Progress Notes (Signed)
Established Patient Office Visit  Subjective:  Patient ID: [name], female    DOB: [birth year]  Age: 61 y.o. MRN: [id number]  CC:  Chief Complaint  Patient presents with   Follow-up    Patient is presents today to follow up with diabetes and asthma. She feels that she didn't do good over the holidays she has cut out soda's. She plans to put on her Orlene Plum back on today. She is doing good with her asthma. She is having sinus issues has a sore in her nose, but had a recent outbreak of shingles. She stated she needs a RF on Breo Ellipta.    HPI [name] presents for chronic condition f/u. She tells me overall she is doing well.   DM She is not currently checking her BG and has not for the last few weeks. She has been in a bit of a slump and did not eat well over the Christmas holiday. She has also had difficulty keeping the libre in place on her arm and that led to limited finger sticks. She plans to put this back on today and closely monitor her blood sugar in preparation for upcoming shoulder surgery.  She reports she feels well overall. She has been decreasing the amount of carbs by reducing her gluten intake as she does feel this is bothering her system.  She has lost about an inch around her waistline.   Asthma She tells me her lungs are clear and she has not had any acute exacerbations. She has not been as consistent with the incruise and breo for a few days because she was feeling so well.  She reports that she uses her rescue inhaler at most 2 days a week and her last use was a few weeks ago and she used it twice in one day and then no more for the week.  She is taking xanax when she has exacerbations to help and this works well for her.  She has no new or worsening symptoms at this time.   Rash She has recently had a rash on her face and was treated for shingles with antivirals. She reports this has resolved with the exception of some redness. She denies any residual pain or  burning. She does have a few scabbed over places in her right nostril that are healing.   Sinus She does have right sided sinus pressure that has been present since about the time of the rash. She reports that this is similar to previous sinus infections. She does have a history of sinus infections that go to her lungs with time. She does not want this to turn into that situation again. She denies fever, chills, BA, cough at this time.    Past Medical History:  Diagnosis Date   Abdominal bloating 11/05/2020   Achilles tendon contracture, right    Acute non-recurrent frontal sinusitis 12/24/2019   ALLERGIC RHINITIS 04/10/2008   Allergic rhinitis 04/10/2008   Qualifier: Diagnosis of  By: Burnice Logan  MD, Doretha Sou    Asthma    asthmatic bronchitis   Breast lump in female    Carpal tunnel syndrome of right wrist    Cellulitis 10/31/2019   Chronic right shoulder pain 05/07/2019   DEPRESSION 08/13/2008   Diabetes mellitus without complication ([facility])    Fatigue 11/17/2015   Fatty liver disease, nonalcoholic 6/46/8032   Hepatitis    fatty liver   HYPERLIPIDEMIA 08/04/2006   HYPERTENSION 08/04/2006   HYPOTHYROIDISM 08/04/2006  Kidney injury    LOW BACK PAIN 08/04/2006   Moderate persistent asthma with (acute) exacerbation 06/27/2019   Neuromuscular disorder (HCC)    Patellofemoral syndrome, right 06/19/2019   PERIMENOPAUSAL SYNDROME 05/08/2008   Plantar fasciitis of right foot 04/12/2017   Plantar fasciitis, left 11/17/2015   Postmenopausal bleeding 07/17/2019   Biopsy and [place] c/w endometrial polyp.  Pt to see Dr. Rosana Hoes for surgical consult.     Primary insomnia 10/31/2019   PVD (peripheral vascular disease) ([facility])    occlusive, status post bifem bypass 2007   SOB (shortness of breath) 07/12/2016   Vaginal candidiasis 10/31/2019    Past Surgical History:  Procedure Laterality Date   CARPAL TUNNEL RELEASE Right 04/05/2016   Procedure: OPEN RIGHT CARPAL TUNNEL RELEASE;  Surgeon: Jessy Oto, MD;   Location: [facility];  Service: Orthopedics;  Laterality: Right;   CHOLECYSTECTOMY     FEMORAL BYPASS     bifem.   HAGLAND'S DEFORMITY EXCISION     PLANTAR FASCIA RELEASE Right 04/18/2018   Procedure: PLANTAR FASCIA RELEASE AND GASTROCNEMIUS RECESSION RIGHT;  Surgeon: Newt Minion, MD;  Location: [facility];  Service: Orthopedics;  Laterality: Right;   TUBAL LIGATION      Family History  Problem Relation Age of Onset   Hypertension Mother    Heart disease Father    Hyperlipidemia Father    Hypertension Father    Alcohol abuse Brother    Diabetes Maternal Grandmother    Alcohol abuse Maternal Uncle    Stroke Maternal Uncle    Ovarian cancer Daughter     Social History   Socioeconomic History   Marital status: Married    Spouse name: Not on file   Number of children: 3   Years of education: Not on file   Highest education level: Not on file  Occupational History   Not on file  Tobacco Use   Smoking status: Former    Types: Cigarettes    Quit date: 02/15/2004    Years since quitting: 17.1   Smokeless tobacco: Never  Vaping Use   Vaping Use: Never used  Substance and Sexual Activity   Alcohol use: Yes    Comment: occasional   Drug use: No   Sexual activity: Yes    Birth control/protection: Post-menopausal  Other Topics Concern   Not on file  Social History Narrative   Not on file   Social Determinants of Health   Financial Resource Strain: Not on file  Food Insecurity: Not on file  Transportation Needs: Not on file  Physical Activity: Not on file  Stress: Not on file  Social Connections: Not on file  Intimate Partner Violence: Not on file    Outpatient Medications Prior to Visit  Medication Sig Dispense Refill   albuterol (PROVENTIL) (2.5 MG/3ML) 0.083% nebulizer solution Take 3 mLs (2.5 mg total) by nebulization every 6 (six) hours as needed for wheezing or shortness of breath. 150 mL 1   albuterol (VENTOLIN HFA) 108 (90 Base) MCG/ACT inhaler  Inhale 1-2 puffs into the lungs every 4 (four) hours as needed for wheezing or shortness of breath. 8.5 g 3   ALPRAZolam (XANAX) 0.25 MG tablet Take 1 tablet (0.25 mg total) by mouth 2 (two) times daily as needed for anxiety. 60 tablet 2   aspirin EC 81 MG tablet Take 1 tablet (81 mg total) by mouth daily. Swallow whole. 90 tablet 3   Azelaic Acid 15 % gel After skin is thoroughly washed  and patted dry, gently but thoroughly massage a thin film of azelaic acid cream into the affected area twice daily, in the morning and evening. 50 g 6   azelastine (ASTELIN) 0.1 % nasal spray Place 1 spray into both nostrils daily as needed for rhinitis. 30 mL 5   B Complex Vitamins (VITAMIN B COMPLEX PO) Take 1 tablet by mouth daily.     benazepril (LOTENSIN) 10 MG tablet Take 1 tablet (10 mg total) by mouth daily. 90 tablet 1   COVID-19 mRNA bivalent vaccine, Moderna, (MODERNA COVID-19 BIVAL BOOSTER) 50 MCG/0.5ML injection Inject into the muscle. 0.5 mL 0   desloratadine (CLARINEX) 5 MG tablet Take 1 tablet (5 mg total) by mouth daily. 30 tablet 11   Dulaglutide (TRULICITY) 6.26 RS/8.5IO SOPN Inject 0.75 mg into the skin once a week. 2 mL 6   levothyroxine (SYNTHROID) 88 MCG tablet Take 1 tablet (88 mcg total) by mouth daily. 30 tablet 2   Menthol, Topical Analgesic, (ICY HOT EX) Apply 1 application topically daily as needed (pain).     metoprolol succinate (TOPROL-XL) 100 MG 24 hr tablet Take 1 tablet (100 mg total) by mouth daily. Take with or immediately following a meal. 90 tablet 3   pantoprazole (PROTONIX) 40 MG tablet Take 1 tablet by mouth everyday. 90 tablet 1   rosuvastatin (CRESTOR) 40 MG tablet Take 1 tablet (40 mg total) by mouth daily. 90 tablet 3   umeclidinium bromide (INCRUSE ELLIPTA) 62.5 MCG/ACT AEPB Inhale 1 puff into the lungs once daily. 30 each 5   venlafaxine XR (EFFEXOR-XR) 75 MG 24 hr capsule TAKE 3 CAPSULES (225 MG TOTAL) BY MOUTH DAILY WITH BREAKFAST. 270 capsule 3   BREO ELLIPTA 100-25  MCG/INH AEPB TAKE 1 PUFF BY MOUTH EVERY DAY 60 each 5   fluconazole (DIFLUCAN) 150 MG tablet Take 1 tablet by mouth once time for 1 dose. Repeat dose 72 hours if yeast infection persists. 2 tablet 1   fluticasone furoate-vilanterol (BREO ELLIPTA) 100-25 MCG/ACT AEPB Inhale 1 puff into the lungs once daily. 60 each 1   umeclidinium bromide (INCRUSE ELLIPTA) 62.5 MCG/INH AEPB Inhale 1 puff into the lungs daily. 7 each 5   No facility-administered medications prior to visit.    Allergies  Allergen Reactions   Influenza Virus Vacc Split Pf Swelling and Other (See Comments)    Swelling around injection site   Lipitor [Atorvastatin] Other (See Comments)    Hepatis     ROS Review of Systems All review of systems negative except what is listed in the HPI    Objective:    Physical Exam Vitals and nursing note reviewed.  Constitutional:      Appearance: Normal appearance.  HENT:     Head: Normocephalic.     Nose: Congestion present.     Mouth/Throat:     Pharynx: Posterior oropharyngeal erythema present.  Eyes:     Extraocular Movements: Extraocular movements intact.     Conjunctiva/sclera: Conjunctivae normal.     Pupils: Pupils are equal, round, and reactive to light.  Neck:     Vascular: No carotid bruit.  Cardiovascular:     Rate and Rhythm: Normal rate and regular rhythm.     Pulses: Normal pulses.     Heart sounds: Normal heart sounds.  Pulmonary:     Effort: Pulmonary effort is normal.     Breath sounds: Normal breath sounds.  Musculoskeletal:     Cervical back: Normal range of motion.  Right lower leg: No edema.     Left lower leg: No edema.  Skin:    General: Skin is warm and dry.     Capillary Refill: Capillary refill takes less than 2 seconds.  Neurological:     General: No focal deficit present.     Mental Status: She is alert and oriented to person, place, and time.  Psychiatric:        Mood and Affect: Mood normal.        Behavior: Behavior normal.         Thought Content: Thought content normal.        Judgment: Judgment normal.    BP 128/82    Pulse 73    Ht 5' 4"  (1.626 m)    Wt 220 lb 12.8 oz (100.2 kg)    SpO2 96%    BMI 37.90 kg/m  Wt Readings from Last 3 Encounters:  03/23/21 220 lb 12.8 oz (100.2 kg)  02/09/21 219 lb (99.3 kg)  01/25/21 219 lb (99.3 kg)     Health Maintenance Due  Topic Date Due   OPHTHALMOLOGY EXAM  Never done   COLONOSCOPY (Pts 45-70yr Insurance coverage will need to be confirmed)  Never done   Zoster Vaccines- Shingrix (2 of 2) 01/15/2018   HEMOGLOBIN A1C  02/26/2021    There are no preventive care reminders to display for this patient.  Lab Results  Component Value Date   TSH 2.040 08/26/2020   Lab Results  Component Value Date   WBC 8.7 11/05/2020   HGB 15.6 11/05/2020   HCT 46.2 11/05/2020   MCV 90 11/05/2020   PLT 170 11/05/2020   Lab Results  Component Value Date   NA 140 11/05/2020   K 4.3 11/05/2020   CO2 21 11/05/2020   GLUCOSE 119 (H) 11/05/2020   BUN 10 11/05/2020   CREATININE 0.94 11/05/2020   BILITOT 0.4 11/05/2020   ALKPHOS 139 (H) 11/05/2020   AST 25 11/05/2020   ALT 23 11/05/2020   PROT 7.5 11/05/2020   ALBUMIN 4.5 11/05/2020   CALCIUM 9.4 11/05/2020   ANIONGAP 11 04/18/2018   EGFR 69 11/05/2020   GFR 74.37 03/25/2013   Lab Results  Component Value Date   CHOL 193 05/17/2019   Lab Results  Component Value Date   HDL 41 (L) 05/17/2019   Lab Results  Component Value Date   LDLCALC 130 (H) 05/17/2019   Lab Results  Component Value Date   TRIG 117 05/17/2019   Lab Results  Component Value Date   CHOLHDL 4.7 05/17/2019   Lab Results  Component Value Date   HGBA1C 6.7 08/26/2020      Assessment & Plan:   Problem List Items Addressed This Visit     Hypothyroidism    We will obtain labs today. No changes to medication at this time. We will make changes to plan of care based on lab results as necessary.      Relevant Orders   CBC with  Differential/Platelet   Comprehensive metabolic panel   Lipid panel   Hemoglobin A1c   TSH   VITAMIN D 25 Hydroxy (Vit-D Deficiency, Fractures)   B12 and Folate Panel   Essential hypertension    Patient well-controlled today at 128/82. No concerning signs or symptoms present today. She is tolerating medication well. We will obtain labs today for further evaluation. No changes to plan of care today.      Relevant Orders   CBC with Differential/Platelet  Comprehensive metabolic panel   Lipid panel   Hemoglobin A1c   TSH   VITAMIN D 25 Hydroxy (Vit-D Deficiency, Fractures)   B12 and Folate Panel   Peripheral vascular disease (HCC)    No signs of lower extremity edema. We will continue to monitor and manage blood pressure aggressively as well as risk factors including diabetes and hyperlipidemia. Patient aware to contact the office if symptoms worsen or fail to improve.      Type 2 diabetes mellitus with vascular disease ([facility]) - Primary    Stable at this time. We will obtain labs today for further evaluation. We will work with patient to monitor blood sugar and keep tight control to ensure no worsening of condition.      Relevant Orders   CBC with Differential/Platelet   Comprehensive metabolic panel   Lipid panel   Hemoglobin A1c   TSH   VITAMIN D 25 Hydroxy (Vit-D Deficiency, Fractures)   B12 and Folate Panel   Nonalcoholic steatohepatitis ([facility])    We will obtain labs today for evaluation. No concerning signs or symptoms present today. Aggressively monitor intake and diabetes to help prevent exacerbation or worsening of condition.      Relevant Orders   CBC with Differential/Platelet   Comprehensive metabolic panel   Lipid panel   Hemoglobin A1c   TSH   VITAMIN D 25 Hydroxy (Vit-D Deficiency, Fractures)   B12 and Folate Panel   Body mass index (BMI) of 36.0-36.9 in adult    BMI has slightly increased since last visit.  She has not been monitoring her blood  sugars or managing her weight as she has in the past. There are concerns with the time of year and worsening depression related to the anniversary of her daughter's death in the next week. Recommend that she continue to monitor her blood sugars and manage conditions aggressively as well as increase physical activity in order to help with mental and physical health.       Relevant Orders   CBC with Differential/Platelet   Comprehensive metabolic panel   Lipid panel   Hemoglobin A1c   TSH   VITAMIN D 25 Hydroxy (Vit-D Deficiency, Fractures)   B12 and Folate Panel   Acute non-recurrent pansinusitis    Symptoms and presentation consistent with pansinusitis. She does have a history of difficult to treat sinusitis and prolonged symptoms which result in complications with her asthma.  Given her history we will begin treatment today with Augmentin for 5 days. Patient aware to continue asthma medication and monitor for worsening or new symptoms and report these immediately.      Relevant Medications   amoxicillin-clavulanate (AUGMENTIN) 875-125 MG tablet   Moderate persistent asthma without complication    Well-controlled today. No acute signs or symptoms present at this time. Given patient's history of recurrent infections will manage sinusitis aggressively. Patient aware to use her medication daily as a preventative maintenance.  We will plan to follow-up in 3 months or sooner if needed.      Relevant Medications   fluticasone furoate-vilanterol (BREO ELLIPTA) 100-25 MCG/ACT AEPB   Other Relevant Orders   CBC with Differential/Platelet   Comprehensive metabolic panel   Lipid panel   Hemoglobin A1c   TSH   VITAMIN D 25 Hydroxy (Vit-D Deficiency, Fractures)   B12 and Folate Panel    Meds ordered this encounter  Medications   fluticasone furoate-vilanterol (BREO ELLIPTA) 100-25 MCG/ACT AEPB    Sig: Inhale 1 puff into the lungs  once daily.    Dispense:  60 each    Refill:  11    amoxicillin-clavulanate (AUGMENTIN) 875-125 MG tablet    Sig: Take 1 tablet by mouth 2 (two) times daily.    Dispense:  20 tablet    Refill:  0    Follow-up: Return in about 3 months (around 06/20/2021) for DM, HTN, HLD, Thyroid (21m.    SOrma Render NP

## 2021-03-23 NOTE — Patient Instructions (Addendum)
Start checking your blood sugars and let me know if they look like they are running high.  We need to make sure we keep this under control so that it doesn't interfere with your healing or ability to have the surgery.

## 2021-03-23 NOTE — Assessment & Plan Note (Signed)
We will obtain labs today for evaluation. No concerning signs or symptoms present today. Aggressively monitor intake and diabetes to help prevent exacerbation or worsening of condition.

## 2021-03-23 NOTE — Assessment & Plan Note (Signed)
>>  ASSESSMENT AND PLAN FOR TYPE 2 DIABETES MELLITUS WITH COMPLICATIONS (HCC) WRITTEN ON 03/23/2021  1:31 PM BY Conlan Miceli E, NP  Stable at this time. We will obtain labs today for further evaluation. We will work with patient to monitor blood sugar and keep tight control to ensure no worsening of condition.

## 2021-03-23 NOTE — Assessment & Plan Note (Signed)
BMI has slightly increased since last visit.  She has not been monitoring her blood sugars or managing her weight as she has in the past. There are concerns with the time of year and worsening depression related to the anniversary of her daughter's death in the next week. Recommend that she continue to monitor her blood sugars and manage conditions aggressively as well as increase physical activity in order to help with mental and physical health.

## 2021-03-23 NOTE — Assessment & Plan Note (Signed)
Symptoms and presentation consistent with pansinusitis. She does have a history of difficult to treat sinusitis and prolonged symptoms which result in complications with her asthma.  Given her history we will begin treatment today with Augmentin for 5 days. Patient aware to continue asthma medication and monitor for worsening or new symptoms and report these immediately.

## 2021-03-23 NOTE — Assessment & Plan Note (Signed)
Patient well-controlled today at 128/82. No concerning signs or symptoms present today. She is tolerating medication well. We will obtain labs today for further evaluation. No changes to plan of care today.

## 2021-03-23 NOTE — Assessment & Plan Note (Signed)
Stable at this time. We will obtain labs today for further evaluation. We will work with patient to monitor blood sugar and keep tight control to ensure no worsening of condition.

## 2021-03-24 ENCOUNTER — Other Ambulatory Visit (HOSPITAL_BASED_OUTPATIENT_CLINIC_OR_DEPARTMENT_OTHER): Payer: Self-pay | Admitting: Nurse Practitioner

## 2021-03-24 ENCOUNTER — Other Ambulatory Visit (HOSPITAL_BASED_OUTPATIENT_CLINIC_OR_DEPARTMENT_OTHER): Payer: Self-pay

## 2021-03-24 DIAGNOSIS — I10 Essential (primary) hypertension: Secondary | ICD-10-CM

## 2021-03-24 MED ORDER — FREESTYLE LIBRE 3 SENSOR MISC
1.0000 [IU] | 11 refills | Status: DC
Start: 2021-03-24 — End: 2021-06-15
  Filled 2021-03-24 – 2021-06-15 (×3): qty 2, 28d supply, fill #0

## 2021-03-25 ENCOUNTER — Other Ambulatory Visit (HOSPITAL_BASED_OUTPATIENT_CLINIC_OR_DEPARTMENT_OTHER): Payer: Self-pay | Admitting: Nurse Practitioner

## 2021-03-25 ENCOUNTER — Encounter (HOSPITAL_BASED_OUTPATIENT_CLINIC_OR_DEPARTMENT_OTHER): Payer: Self-pay | Admitting: Nurse Practitioner

## 2021-03-25 ENCOUNTER — Other Ambulatory Visit (HOSPITAL_BASED_OUTPATIENT_CLINIC_OR_DEPARTMENT_OTHER): Payer: Self-pay

## 2021-03-25 DIAGNOSIS — E890 Postprocedural hypothyroidism: Secondary | ICD-10-CM

## 2021-03-25 DIAGNOSIS — I10 Essential (primary) hypertension: Secondary | ICD-10-CM

## 2021-03-25 DIAGNOSIS — E559 Vitamin D deficiency, unspecified: Secondary | ICD-10-CM

## 2021-03-25 LAB — CBC WITH DIFFERENTIAL/PLATELET
Basophils Absolute: 0.1 10*3/uL (ref 0.0–0.2)
Basos: 1 %
EOS (ABSOLUTE): 0.2 10*3/uL (ref 0.0–0.4)
Eos: 3 %
Hematocrit: 46.2 % (ref 34.0–46.6)
Hemoglobin: 15.7 g/dL (ref 11.1–15.9)
Immature Grans (Abs): 0 10*3/uL (ref 0.0–0.1)
Immature Granulocytes: 0 %
Lymphocytes Absolute: 3.2 10*3/uL — ABNORMAL HIGH (ref 0.7–3.1)
Lymphs: 44 %
MCH: 30.7 pg (ref 26.6–33.0)
MCHC: 34 g/dL (ref 31.5–35.7)
MCV: 90 fL (ref 79–97)
Monocytes Absolute: 0.6 10*3/uL (ref 0.1–0.9)
Monocytes: 9 %
Neutrophils Absolute: 3.1 10*3/uL (ref 1.4–7.0)
Neutrophils: 43 %
Platelets: 205 10*3/uL (ref 150–450)
RBC: 5.11 x10E6/uL (ref 3.77–5.28)
RDW: 13.2 % (ref 11.7–15.4)
WBC: 7.3 10*3/uL (ref 3.4–10.8)

## 2021-03-25 LAB — COMPREHENSIVE METABOLIC PANEL
ALT: 37 IU/L — ABNORMAL HIGH (ref 0–32)
AST: 33 IU/L (ref 0–40)
Albumin/Globulin Ratio: 1.5 (ref 1.2–2.2)
Albumin: 4.5 g/dL (ref 3.8–4.9)
Alkaline Phosphatase: 140 IU/L — ABNORMAL HIGH (ref 44–121)
BUN/Creatinine Ratio: 9 — ABNORMAL LOW (ref 12–28)
BUN: 9 mg/dL (ref 8–27)
Bilirubin Total: 0.4 mg/dL (ref 0.0–1.2)
CO2: 19 mmol/L — ABNORMAL LOW (ref 20–29)
Calcium: 9.5 mg/dL (ref 8.7–10.3)
Chloride: 105 mmol/L (ref 96–106)
Creatinine, Ser: 1.01 mg/dL — ABNORMAL HIGH (ref 0.57–1.00)
Globulin, Total: 3.1 g/dL (ref 1.5–4.5)
Glucose: 133 mg/dL — ABNORMAL HIGH (ref 70–99)
Potassium: 4.4 mmol/L (ref 3.5–5.2)
Sodium: 141 mmol/L (ref 134–144)
Total Protein: 7.6 g/dL (ref 6.0–8.5)
eGFR: 64 mL/min/{1.73_m2} (ref >59)

## 2021-03-25 LAB — LIPID PANEL
Chol/HDL Ratio: 6.2 ratio — ABNORMAL HIGH (ref 0.0–4.4)
Cholesterol, Total: 247 mg/dL — ABNORMAL HIGH (ref 100–199)
HDL: 40 mg/dL (ref >39)
LDL Chol Calc (NIH): 176 mg/dL — ABNORMAL HIGH (ref 0–99)
Triglycerides: 169 mg/dL — ABNORMAL HIGH (ref 0–149)
VLDL Cholesterol Cal: 31 mg/dL (ref 5–40)

## 2021-03-25 LAB — B12 AND FOLATE PANEL
Folate: 9.5 ng/mL (ref >3.0)
Vitamin B-12: 771 pg/mL (ref 232–1245)

## 2021-03-25 LAB — VITAMIN D 25 HYDROXY (VIT D DEFICIENCY, FRACTURES): Vit D, 25-Hydroxy: 17.9 ng/mL — ABNORMAL LOW (ref 30.0–100.0)

## 2021-03-25 LAB — HEMOGLOBIN A1C
Est. average glucose Bld gHb Est-mCnc: 154 mg/dL
Hgb A1c MFr Bld: 7 % — ABNORMAL HIGH (ref 4.8–5.6)

## 2021-03-25 LAB — TSH: TSH: 4.34 u[IU]/mL (ref 0.450–4.500)

## 2021-03-25 MED ORDER — LEVOTHYROXINE SODIUM 88 MCG PO TABS
ORAL_TABLET | ORAL | 3 refills | Status: DC
  Filled 2021-03-25: qty 35, 30d supply, fill #0
  Filled 2021-04-26: qty 35, 30d supply, fill #1
  Filled 2021-05-24: qty 35, 30d supply, fill #2
  Filled 2021-07-19: qty 35, 30d supply, fill #3

## 2021-03-25 MED ORDER — VITAMIN D (ERGOCALCIFEROL) 1.25 MG (50000 UNIT) PO CAPS
50000.0000 [IU] | ORAL_CAPSULE | ORAL | 3 refills | Status: DC
  Filled 2021-03-25: qty 4, 28d supply, fill #0
  Filled 2021-04-26: qty 4, 28d supply, fill #1
  Filled 2021-05-24: qty 4, 28d supply, fill #2
  Filled 2021-07-19: qty 4, 28d supply, fill #3
  Filled 2021-12-21: qty 4, 28d supply, fill #4

## 2021-03-26 ENCOUNTER — Encounter (HOSPITAL_BASED_OUTPATIENT_CLINIC_OR_DEPARTMENT_OTHER): Payer: Self-pay | Admitting: Nurse Practitioner

## 2021-03-26 ENCOUNTER — Other Ambulatory Visit (HOSPITAL_BASED_OUTPATIENT_CLINIC_OR_DEPARTMENT_OTHER): Payer: Self-pay | Admitting: Nurse Practitioner

## 2021-03-26 ENCOUNTER — Other Ambulatory Visit (HOSPITAL_BASED_OUTPATIENT_CLINIC_OR_DEPARTMENT_OTHER): Payer: Self-pay

## 2021-03-26 DIAGNOSIS — I10 Essential (primary) hypertension: Secondary | ICD-10-CM

## 2021-03-26 MED ORDER — BENAZEPRIL HCL 10 MG PO TABS
10.0000 mg | ORAL_TABLET | Freq: Every day | ORAL | 1 refills | Status: DC
  Filled 2021-03-26: qty 90, 90d supply, fill #0
  Filled 2021-07-19: qty 30, 30d supply, fill #1
  Filled 2021-08-23: qty 30, 30d supply, fill #2
  Filled 2021-09-24: qty 30, 30d supply, fill #3

## 2021-03-26 MED ORDER — FLUCONAZOLE 150 MG PO TABS
150.0000 mg | ORAL_TABLET | Freq: Once | ORAL | 0 refills | Status: AC
  Filled 2021-03-26: qty 2, 2d supply, fill #0

## 2021-03-29 ENCOUNTER — Other Ambulatory Visit (HOSPITAL_BASED_OUTPATIENT_CLINIC_OR_DEPARTMENT_OTHER): Payer: Self-pay

## 2021-04-05 ENCOUNTER — Other Ambulatory Visit (HOSPITAL_BASED_OUTPATIENT_CLINIC_OR_DEPARTMENT_OTHER): Payer: Self-pay

## 2021-04-05 MED ORDER — OXYCODONE HCL 5 MG PO TABS
ORAL_TABLET | ORAL | 0 refills | Status: DC
  Filled 2021-04-05: qty 20, 4d supply, fill #0

## 2021-04-05 MED ORDER — ONDANSETRON 4 MG PO TBDP
ORAL_TABLET | ORAL | 0 refills | Status: DC
  Filled 2021-04-05: qty 20, 7d supply, fill #0

## 2021-04-06 ENCOUNTER — Other Ambulatory Visit (HOSPITAL_BASED_OUTPATIENT_CLINIC_OR_DEPARTMENT_OTHER): Payer: Self-pay

## 2021-04-13 ENCOUNTER — Other Ambulatory Visit (HOSPITAL_BASED_OUTPATIENT_CLINIC_OR_DEPARTMENT_OTHER): Payer: Self-pay

## 2021-04-13 MED ORDER — OXYCODONE HCL 5 MG PO TABS
ORAL_TABLET | ORAL | 0 refills | Status: DC
  Filled 2021-04-13: qty 20, 4d supply, fill #0

## 2021-04-14 ENCOUNTER — Other Ambulatory Visit (HOSPITAL_BASED_OUTPATIENT_CLINIC_OR_DEPARTMENT_OTHER): Payer: Self-pay

## 2021-04-15 ENCOUNTER — Other Ambulatory Visit (HOSPITAL_BASED_OUTPATIENT_CLINIC_OR_DEPARTMENT_OTHER): Payer: Self-pay

## 2021-04-20 ENCOUNTER — Other Ambulatory Visit (HOSPITAL_BASED_OUTPATIENT_CLINIC_OR_DEPARTMENT_OTHER): Payer: Self-pay

## 2021-04-20 MED ORDER — TRAMADOL HCL 50 MG PO TABS
ORAL_TABLET | ORAL | 0 refills | Status: DC
  Filled 2021-04-20: qty 30, 7d supply, fill #0

## 2021-04-21 ENCOUNTER — Other Ambulatory Visit: Payer: Self-pay

## 2021-04-21 ENCOUNTER — Ambulatory Visit (INDEPENDENT_AMBULATORY_CARE_PROVIDER_SITE_OTHER): Payer: 59 | Admitting: Family Medicine

## 2021-04-21 ENCOUNTER — Encounter (HOSPITAL_BASED_OUTPATIENT_CLINIC_OR_DEPARTMENT_OTHER): Payer: Self-pay | Admitting: Family Medicine

## 2021-04-21 DIAGNOSIS — S0990XA Unspecified injury of head, initial encounter: Secondary | ICD-10-CM

## 2021-04-21 NOTE — Patient Instructions (Signed)
?  Medication Instructions:  ?Your physician recommends that you continue on your current medications as directed. Please refer to the Current Medication list given to you today. ?--If you need a refill on any your medications before your next appointment, please call your pharmacy first. If no refills are authorized on file call the office.-- ? ? ? ?Follow-Up: ?Your next appointment:   ?Your physician recommends that you schedule a follow-up appointment in: Keep follow up appointment with Sarabeth ? ?You will receive a text message or e-mail with a link to a survey about your care and experience with [place] today! We would greatly appreciate your feedback!  ? ?Thanks for letting us be apart of your health journey!!  ?Primary Care and Sports Medicine  ? ?Dr. Kyung Rudd [place]  ? ?We encourage you to activate your patient portal called "MyChart".  Sign up information is provided on this After Visit Summary.  MyChart is used to connect with patients for Virtual Visits (Telemedicine).  Patients are able to view lab/test results, encounter notes, upcoming appointments, etc.  Non-urgent messages can be sent to your provider as well. To learn more about what you can do with MyChart, please visit --  [email or web address].   ? ?
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
## 2021-04-26 ENCOUNTER — Other Ambulatory Visit (HOSPITAL_BASED_OUTPATIENT_CLINIC_OR_DEPARTMENT_OTHER): Payer: Self-pay

## 2021-04-29 ENCOUNTER — Other Ambulatory Visit (HOSPITAL_BASED_OUTPATIENT_CLINIC_OR_DEPARTMENT_OTHER): Payer: Self-pay

## 2021-05-05 ENCOUNTER — Other Ambulatory Visit (HOSPITAL_BASED_OUTPATIENT_CLINIC_OR_DEPARTMENT_OTHER): Payer: Self-pay

## 2021-05-10 ENCOUNTER — Other Ambulatory Visit (HOSPITAL_BASED_OUTPATIENT_CLINIC_OR_DEPARTMENT_OTHER): Payer: Self-pay

## 2021-05-18 ENCOUNTER — Other Ambulatory Visit (HOSPITAL_BASED_OUTPATIENT_CLINIC_OR_DEPARTMENT_OTHER): Payer: Self-pay

## 2021-05-24 ENCOUNTER — Other Ambulatory Visit (HOSPITAL_BASED_OUTPATIENT_CLINIC_OR_DEPARTMENT_OTHER): Payer: Self-pay

## 2021-05-25 ENCOUNTER — Other Ambulatory Visit (HOSPITAL_BASED_OUTPATIENT_CLINIC_OR_DEPARTMENT_OTHER): Payer: Self-pay | Admitting: Nurse Practitioner

## 2021-05-25 ENCOUNTER — Other Ambulatory Visit (HOSPITAL_BASED_OUTPATIENT_CLINIC_OR_DEPARTMENT_OTHER): Payer: Self-pay

## 2021-05-25 DIAGNOSIS — E1159 Type 2 diabetes mellitus with other circulatory complications: Secondary | ICD-10-CM

## 2021-05-25 DIAGNOSIS — K7581 Nonalcoholic steatohepatitis (NASH): Secondary | ICD-10-CM

## 2021-05-25 DIAGNOSIS — I10 Essential (primary) hypertension: Secondary | ICD-10-CM

## 2021-05-25 DIAGNOSIS — Z6836 Body mass index (BMI) 36.0-36.9, adult: Secondary | ICD-10-CM

## 2021-05-25 MED ORDER — TRULICITY 0.75 MG/0.5ML ~~LOC~~ SOAJ
0.7500 mg | SUBCUTANEOUS | 1 refills | Status: DC
  Filled 2021-05-25: qty 2, 28d supply, fill #0

## 2021-06-07 ENCOUNTER — Other Ambulatory Visit (HOSPITAL_BASED_OUTPATIENT_CLINIC_OR_DEPARTMENT_OTHER): Payer: Self-pay

## 2021-06-07 MED ORDER — METHYLPREDNISOLONE 4 MG PO TBPK
ORAL_TABLET | ORAL | 0 refills | Status: DC
  Filled 2021-06-07: qty 21, 6d supply, fill #0

## 2021-06-07 MED ORDER — CELECOXIB 200 MG PO CAPS
ORAL_CAPSULE | ORAL | 0 refills | Status: DC
  Filled 2021-06-07: qty 60, 30d supply, fill #0

## 2021-06-07 MED FILL — Pantoprazole Sodium EC Tab 40 MG (Base Equiv): ORAL | 30 days supply | Qty: 30 | Fill #1 | Status: AC

## 2021-06-14 ENCOUNTER — Encounter (HOSPITAL_BASED_OUTPATIENT_CLINIC_OR_DEPARTMENT_OTHER): Payer: Self-pay | Admitting: Nurse Practitioner

## 2021-06-14 ENCOUNTER — Other Ambulatory Visit (HOSPITAL_BASED_OUTPATIENT_CLINIC_OR_DEPARTMENT_OTHER): Payer: Self-pay

## 2021-06-14 ENCOUNTER — Ambulatory Visit (INDEPENDENT_AMBULATORY_CARE_PROVIDER_SITE_OTHER): Payer: 59 | Admitting: Nurse Practitioner

## 2021-06-14 VITALS — BP 128/88 | HR 71 | Ht 64.0 in | Wt 216.6 lb

## 2021-06-14 DIAGNOSIS — N63 Unspecified lump in unspecified breast: Secondary | ICD-10-CM | POA: Diagnosis not present

## 2021-06-14 DIAGNOSIS — E1159 Type 2 diabetes mellitus with other circulatory complications: Secondary | ICD-10-CM

## 2021-06-14 MED ORDER — METFORMIN HCL 500 MG PO TABS
ORAL_TABLET | ORAL | 3 refills | Status: DC
  Filled 2021-06-14: qty 60, 30d supply, fill #0
  Filled 2021-07-30: qty 60, 15d supply, fill #1

## 2021-06-14 NOTE — Patient Instructions (Addendum)
It was a pleasure seeing you today. I hope your time spent with [place] was pleasant and helpful. Please let us know if there is anything we can do to improve the service you receive.  ? ? ? ? ? ? ? ?Important Office Information ?Lab Results ?If labs were ordered, please note that you will see results through [facility] as soon as they come available from [place].  ?It takes up to 5 business days for the results to be routed to me and for me to review them once all of the lab results have come through from [facility]. I will make recommendations based on your results and send these through [place] or someone from the office will call you to discuss. If your labs are abnormal, we may contact you to schedule a visit to discuss the results and make recommendations.  ?If you have not heard from [place] within 5 business days or you have waited longer than a week and your lab results have not come through on [place], please feel free to call the office or send a message through [place] to follow-up on these labs.  ? ?Referrals ?If referrals were placed today, the office where the referral was sent will contact you either by phone or through [place] to set up scheduling. Please note that it can take up to a week for the referral office to contact you. If you do not hear from them in a week, please contact the referral office directly to inquire about scheduling.  ? ?Condition Treated ?If your condition worsens or you begin to have new symptoms, please schedule a follow-up appointment for further evaluation. If you are not sure if an appointment is needed, you may call the office to leave a message for the nurse and someone will contact you with recommendations.  ?If you have an urgent or life threatening emergency, please do not call the office, but seek emergency evaluation by calling 911 or going to the nearest emergency room for evaluation.  ? ?MyChart and Phone Calls ?Please do not use MyChart for urgent messages. It may take up to 3  business days for MyChart messages to be read by staff and if they are unable to handle the request, an additional 3 business days for them to be routed to me and for my response.  ?Messages sent to the provider through [place] do not come directly to the provider, please allow time for these messages to be routed and for me to respond.  ?We get a large volume of MyChart messages daily and these are responded to in the order received.  ? ?For urgent messages, please call the office at [phone] and speak with the front office staff or leave a message on the line of my assistant for guidance.  ?We are seeing patients from the hours of 8:00 am through 5:00 pm and calls directly to the nurse may not be answered immediately due to seeing patients, but your call will be returned as soon as possible.  ?Phone  messages received after 4:00 PM Monday through Thursday may not be returned until the following business day. Phone messages received after 11:00 AM on Friday may not be returned until Monday.  ? ?After Hours ?We share on call hours with providers from other offices. If you have an urgent need after hours that cannot wait until the next business day, please contact the on call provider by calling the office number. A nurse will speak with you and contact the provider  if needed for recommendations.  ?If you have an urgent or life threatening emergency after hours, please do not call the on call provider, but seek emergency evaluation by calling 911 or going to the nearest emergency room for evaluation.  ? ?Paperwork ?All paperwork requires a minimum of 5 days to complete and return to you or the designated personnel. Please keep this in mind when bringing in forms or sending requests for paperwork completion to the office.  ?  ?

## 2021-06-14 NOTE — Progress Notes (Addendum)
?Worthy Keeler, DNP, AGNP-c ?[facility] ?[address] ?[address] ?[place], [place] [address] ?[phone] Office [phone] Fax ? ?ESTABLISHED PATIENT- Chronic Health and/or Follow-Up Visit ? ?Blood pressure 128/88, pulse 71, height 5' 4"  (1.626 m), weight 216 lb 9.6 oz (98.2 kg), SpO2 97 %. ? ?No chief complaint on file. ? ? ?HPI ? ?[name]  is a 61 y.o. year old female presenting today for evaluation and management of the following: ?Diabetes ?Jeslie reports that she is feeling good over the past several weeks ?She has been monitoring her diet and increasing her exercise daily and has lost 8 pounds ?She is taking her metformin as prescribed with mild GI distress but she feels this is manageable ?She was recently on prednisone taper for neck pain and request that we wait to measure her A1c as she is concerned that will be elevated today ?She has been monitoring her blood sugars regularly and has noted that they have been elevated since she has been on the prednisone but prior to that were within normal limits ?Breast lump ?She has not has noticed a lump in the right breast that is not going away ?She would like an order for ultrasound today ?She has not noticed any changes to the skin ? ?ROS ?All ROS negative with exception of what is listed in HPI ? ?PHYSICAL EXAM ?Physical Exam ?Vitals and nursing note reviewed.  ?Constitutional:   ?   Appearance: Normal appearance.  ?HENT:  ?   Head: Normocephalic.  ?Eyes:  ?   Extraocular Movements: Extraocular movements intact.  ?   Conjunctiva/sclera: Conjunctivae normal.  ?   Pupils: Pupils are equal, round, and reactive to light.  ?Cardiovascular:  ?   Rate and Rhythm: Normal rate and regular rhythm.  ?   Pulses: Normal pulses.  ?   Heart sounds: Normal heart sounds.  ?Pulmonary:  ?   Effort: Pulmonary effort is normal.  ?   Breath sounds: Normal breath sounds.  ?Chest:  ?Breasts: ?   Tanner Score is 5.  ?   Breasts are  symmetrical.  ?   Right: Mass present. No swelling, bleeding, skin change or tenderness.  ?   Left: No swelling, bleeding, skin change or tenderness.  ?Abdominal:  ?   General: Bowel sounds are normal.  ?   Palpations: Abdomen is soft.  ?Musculoskeletal:     ?   General: Normal range of motion.  ?   Cervical back: Normal range of motion.  ?   Right lower leg: No edema.  ?   Left lower leg: No edema.  ?Lymphadenopathy:  ?   Cervical: No cervical adenopathy.  ?   Upper Body:  ?   Right upper body: No supraclavicular, axillary or pectoral adenopathy.  ?   Left upper body: No supraclavicular, axillary or pectoral adenopathy.  ?Skin: ?   General: Skin is warm and dry.  ?   Capillary Refill: Capillary refill takes less than 2 seconds.  ?Neurological:  ?   General: No focal deficit present.  ?   Mental Status: She is alert and oriented to person, place, and time.  ?Psychiatric:     ?   Mood and Affect: Mood normal.     ?   Behavior: Behavior normal.     ?   Thought Content: Thought content normal.     ?   Judgment: Judgment normal.  ? ? ?ASSESSMENT & PLAN ?Problem List Items Addressed This Visit   ? ?  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  Chronic.  Doing well on current medication regimen.  Checking blood sugars regularly with CGM.  Monitoring diet and exercise.  She has lost a total of 8 pounds which is fantastic!  We will avoid collecting A1c today as she has recently been on steroids and this has increased her fasting blood glucose levels but we will plan to get this at her next visit.  No alarm symptoms present today. ?Recommend continuation of current medication regimen and let us know if she notices any changes her blood sugars do not return to normal approximately 1 week after completing steroids. ? ?  ?  ? Relevant Medications  ? metFORMIN (GLUCOPHAGE) 500 MG tablet  ? Breast lump in female  ?  Right-sided breast lump.  Given patient's family history of ovarian cancer in her daughter we  do want to monitor this very closely.  We will send order today for diagnostic mammogram and ultrasound for further evaluation.  No alarm symptoms are present at this time. ?We will make changes to plan of care based on findings of the ultrasound/mammogram.  Patient is aware if any new changes arise to notify immediately. ? ?  ?  ? Relevant Orders  ? US BREAST LTD UNI RIGHT INC AXILLA  ? ? ? ?FOLLOW-UP ?Return for labs in 4-6 weeks. ? ? ?Worthy Keeler, DNP, AGNP-c ?06/14/2021  3:47 PM ?

## 2021-06-15 ENCOUNTER — Other Ambulatory Visit (HOSPITAL_BASED_OUTPATIENT_CLINIC_OR_DEPARTMENT_OTHER): Payer: Self-pay

## 2021-06-15 ENCOUNTER — Other Ambulatory Visit (HOSPITAL_BASED_OUTPATIENT_CLINIC_OR_DEPARTMENT_OTHER): Payer: Self-pay | Admitting: Nurse Practitioner

## 2021-06-15 DIAGNOSIS — Z6836 Body mass index (BMI) 36.0-36.9, adult: Secondary | ICD-10-CM

## 2021-06-15 DIAGNOSIS — E1159 Type 2 diabetes mellitus with other circulatory complications: Secondary | ICD-10-CM

## 2021-06-15 DIAGNOSIS — N63 Unspecified lump in unspecified breast: Secondary | ICD-10-CM

## 2021-06-15 DIAGNOSIS — E785 Hyperlipidemia, unspecified: Secondary | ICD-10-CM

## 2021-06-15 DIAGNOSIS — K7581 Nonalcoholic steatohepatitis (NASH): Secondary | ICD-10-CM

## 2021-06-15 DIAGNOSIS — I10 Essential (primary) hypertension: Secondary | ICD-10-CM

## 2021-06-15 DIAGNOSIS — I739 Peripheral vascular disease, unspecified: Secondary | ICD-10-CM

## 2021-06-15 MED ORDER — FREESTYLE LIBRE 3 SENSOR MISC
11 refills | Status: DC
  Filled 2021-06-15: qty 2, 28d supply, fill #0

## 2021-06-20 DIAGNOSIS — N63 Unspecified lump in unspecified breast: Secondary | ICD-10-CM | POA: Insufficient documentation

## 2021-06-20 NOTE — Assessment & Plan Note (Signed)
>>  ASSESSMENT AND PLAN FOR TYPE 2 DIABETES MELLITUS WITH COMPLICATIONS (HCC) WRITTEN ON 06/20/2021  9:49 PM BY Jaisha Villacres E, NP  Chronic.  Doing well on current medication regimen.  Checking blood sugars regularly with CGM.  Monitoring diet and exercise.  She has lost a total of 8 pounds which is fantastic!  We will avoid collecting A1c today as she has recently been on steroids and this has increased her fasting blood glucose levels but we will plan to get this at her next visit.  No alarm symptoms present today. Recommend continuation of current medication regimen and let us  know if she notices any changes her blood sugars do not return to normal approximately 1 week after completing steroids.

## 2021-06-20 NOTE — Assessment & Plan Note (Signed)
Chronic.  Doing well on current medication regimen.  Checking blood sugars regularly with CGM.  Monitoring diet and exercise.  She has lost a total of 8 pounds which is fantastic!  We will avoid collecting A1c today as she has recently been on steroids and this has increased her fasting blood glucose levels but we will plan to get this at her next visit.  No alarm symptoms present today. ?Recommend continuation of current medication regimen and let us know if she notices any changes her blood sugars do not return to normal approximately 1 week after completing steroids. ?

## 2021-06-20 NOTE — Assessment & Plan Note (Addendum)
Right-sided breast lump.  Given patient's family history of ovarian cancer in her daughter we do want to monitor this very closely.  We will send order today for diagnostic mammogram and ultrasound for further evaluation.  No alarm symptoms are present at this time. ?We will make changes to plan of care based on findings of the ultrasound/mammogram.  Patient is aware if any new changes arise to notify immediately. ?

## 2021-06-21 ENCOUNTER — Other Ambulatory Visit (HOSPITAL_BASED_OUTPATIENT_CLINIC_OR_DEPARTMENT_OTHER): Payer: Self-pay

## 2021-06-23 ENCOUNTER — Other Ambulatory Visit (HOSPITAL_BASED_OUTPATIENT_CLINIC_OR_DEPARTMENT_OTHER): Payer: Self-pay

## 2021-06-28 ENCOUNTER — Other Ambulatory Visit (HOSPITAL_BASED_OUTPATIENT_CLINIC_OR_DEPARTMENT_OTHER): Payer: Self-pay

## 2021-06-29 ENCOUNTER — Other Ambulatory Visit (HOSPITAL_BASED_OUTPATIENT_CLINIC_OR_DEPARTMENT_OTHER): Payer: Self-pay

## 2021-06-30 ENCOUNTER — Other Ambulatory Visit (HOSPITAL_BASED_OUTPATIENT_CLINIC_OR_DEPARTMENT_OTHER): Payer: Self-pay

## 2021-07-01 ENCOUNTER — Telehealth (HOSPITAL_BASED_OUTPATIENT_CLINIC_OR_DEPARTMENT_OTHER): Payer: Self-pay | Admitting: Nurse Practitioner

## 2021-07-01 ENCOUNTER — Other Ambulatory Visit (HOSPITAL_BASED_OUTPATIENT_CLINIC_OR_DEPARTMENT_OTHER): Payer: Self-pay

## 2021-07-01 ENCOUNTER — Encounter (HOSPITAL_BASED_OUTPATIENT_CLINIC_OR_DEPARTMENT_OTHER): Payer: Self-pay

## 2021-07-01 NOTE — Telephone Encounter (Signed)
Received fax transmission from pt's ins company denying coverage [employer] 3 sensor. Documents will be in provider's yellow dot tray.

## 2021-07-02 ENCOUNTER — Ambulatory Visit
Admission: RE | Admit: 2021-07-02 | Discharge: 2021-07-02 | Disposition: A | Discharge status: Home / Self Care | Payer: 59 | Source: Ambulatory Visit | Attending: Nurse Practitioner | Admitting: Nurse Practitioner

## 2021-07-02 ENCOUNTER — Ambulatory Visit: Payer: 59

## 2021-07-02 DIAGNOSIS — N63 Unspecified lump in unspecified breast: Secondary | ICD-10-CM

## 2021-07-05 ENCOUNTER — Other Ambulatory Visit (HOSPITAL_BASED_OUTPATIENT_CLINIC_OR_DEPARTMENT_OTHER): Payer: Self-pay

## 2021-07-13 ENCOUNTER — Ambulatory Visit (HOSPITAL_BASED_OUTPATIENT_CLINIC_OR_DEPARTMENT_OTHER): Payer: 59

## 2021-07-19 ENCOUNTER — Other Ambulatory Visit (HOSPITAL_BASED_OUTPATIENT_CLINIC_OR_DEPARTMENT_OTHER): Payer: Self-pay

## 2021-07-19 MED FILL — Pantoprazole Sodium EC Tab 40 MG (Base Equiv): ORAL | 30 days supply | Qty: 30 | Fill #2 | Status: AC

## 2021-07-23 ENCOUNTER — Other Ambulatory Visit (HOSPITAL_BASED_OUTPATIENT_CLINIC_OR_DEPARTMENT_OTHER): Payer: Self-pay

## 2021-07-30 ENCOUNTER — Other Ambulatory Visit (HOSPITAL_BASED_OUTPATIENT_CLINIC_OR_DEPARTMENT_OTHER): Payer: Self-pay

## 2021-08-02 ENCOUNTER — Telehealth (HOSPITAL_BASED_OUTPATIENT_CLINIC_OR_DEPARTMENT_OTHER): Payer: Self-pay

## 2021-08-02 NOTE — Telephone Encounter (Signed)
PA started for Trulicity via CoverMyMeds

## 2021-08-02 NOTE — Telephone Encounter (Signed)
Prescription approved.

## 2021-08-03 ENCOUNTER — Telehealth: Payer: Self-pay

## 2021-08-03 NOTE — Telephone Encounter (Signed)
Received message from cover my meds. Patient was approved for Trulicity  PA Case ID [id number]

## 2021-08-23 ENCOUNTER — Emergency Department (INDEPENDENT_AMBULATORY_CARE_PROVIDER_SITE_OTHER)
Admission: EM | Admit: 2021-08-23 | Discharge: 2021-08-23 | Disposition: A | Discharge status: Home / Self Care | Payer: 59 | Source: Home / Self Care

## 2021-08-23 ENCOUNTER — Other Ambulatory Visit (HOSPITAL_BASED_OUTPATIENT_CLINIC_OR_DEPARTMENT_OTHER): Payer: Self-pay

## 2021-08-23 DIAGNOSIS — J0100 Acute maxillary sinusitis, unspecified: Secondary | ICD-10-CM

## 2021-08-23 DIAGNOSIS — R350 Frequency of micturition: Secondary | ICD-10-CM | POA: Diagnosis not present

## 2021-08-23 LAB — POCT URINALYSIS DIP (MANUAL ENTRY)
Bilirubin, UA: NEGATIVE
Blood, UA: NEGATIVE
Glucose, UA: 100 mg/dL — AB
Ketones, POC UA: NEGATIVE mg/dL
Leukocytes, UA: NEGATIVE
Nitrite, UA: NEGATIVE
Protein Ur, POC: NEGATIVE mg/dL
Spec Grav, UA: 1.02 (ref 1.010–1.025)
Urobilinogen, UA: 0.2 E.U./dL
pH, UA: 5.5 (ref 5.0–8.0)

## 2021-08-23 MED ORDER — AMOXICILLIN-POT CLAVULANATE 875-125 MG PO TABS
1.0000 | ORAL_TABLET | Freq: Two times a day (BID) | ORAL | 0 refills | Status: AC
  Filled 2021-08-23: qty 14, 7d supply, fill #0

## 2021-08-23 MED FILL — Pantoprazole Sodium EC Tab 40 MG (Base Equiv): ORAL | 30 days supply | Qty: 30 | Fill #3 | Status: AC

## 2021-08-23 NOTE — Discharge Instructions (Addendum)
Instructed patient to take medication as directed with food to completion.  Encouraged patient to increase daily water intake while taking this medication.  Advised patient we will follow-up with urine culture results once received.  Advised if symptoms worsen and/or unresolved please follow-up PCP or here for further evaluation.

## 2021-08-23 NOTE — ED Provider Notes (Signed)
[facility]    CSN: [id number] Arrival date & time: 08/23/21  1150      History   Chief Complaint Chief Complaint  Patient presents with   Urinary Frequency    HPI [name] is a 61 y.o. female.   HPI 61 year old female presents with urinary frequency reports having abnormal UA when routine labs were done for a weight loss program she is joining.  UA reveals small leukocytes positive nitrites.  Patient reports initially thought urinary frequency was attributed to drinking more water.  Additionally, patient reports sinus nasal congestion and sinus pressure for the past 5-6 days. PMH significant for obesity, PVD, T2DM, and HTN.  Past Medical History:  Diagnosis Date   Abdominal bloating 11/05/2020   Achilles tendon contracture, right    Acute non-recurrent frontal sinusitis 12/24/2019   ALLERGIC RHINITIS 04/10/2008   Allergic rhinitis 04/10/2008   Qualifier: Diagnosis of  By: Burnice Logan  MD, Doretha Sou    Asthma    asthmatic bronchitis   Breast lump in female    Carpal tunnel syndrome of right wrist    Cellulitis 10/31/2019   Chronic right shoulder pain 05/07/2019   DEPRESSION 08/13/2008   Diabetes mellitus without complication ([place])    Fatigue 11/17/2015   Fatty liver disease, nonalcoholic 0/34/7425   Hepatitis    fatty liver   HYPERLIPIDEMIA 08/04/2006   HYPERTENSION 08/04/2006   HYPOTHYROIDISM 08/04/2006   Kidney injury    LOW BACK PAIN 08/04/2006   Moderate persistent asthma with (acute) exacerbation 06/27/2019   Neuromuscular disorder (HCC)    Patellofemoral syndrome, right 06/19/2019   PERIMENOPAUSAL SYNDROME 05/08/2008   Plantar fasciitis of right foot 04/12/2017   Plantar fasciitis, left 11/17/2015   Postmenopausal bleeding 07/17/2019   Biopsy and [place] c/w endometrial polyp.  Pt to see Dr. Rosana Hoes for surgical consult.     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   Pain of finger of right hand 02/09/2021   Herpes zoster 01/25/2021   Subacromial impingement of right shoulder 12/31/2020   Moderate persistent asthma without complication 95/63/8756   Diarrhea 11/05/2020   Reflux gastritis 11/05/2020   Acute non-recurrent pansinusitis 08/04/2020   Rosacea 08/04/2020   Situational anxiety 05/11/2020   Body mass index (BMI) of 36.0-36.9 in adult 05/11/2020   Abnormality of right breast on screening mammogram 10/14/2019   DDD (degenerative disc disease), cervical 43/32/9518   Nonalcoholic steatohepatitis ([facility]) 08/08/2019   Migraine with aura and without status migrainosus, not intractable 05/31/2017   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   HAGLAND'S DEFORMITY EXCISION     PLANTAR FASCIA RELEASE Right  04/18/2018   Procedure: PLANTAR FASCIA RELEASE AND GASTROCNEMIUS RECESSION RIGHT;  Surgeon: Newt Minion, MD;  Location: [facility];  Service: Orthopedics;  Laterality: Right;   TUBAL LIGATION      OB History     Gravida  3   Para  3   Term  3   Preterm      AB      Living  2      SAB      IAB      Ectopic      Multiple      Live Births               Home  Medications    Prior to Admission medications   Medication Sig Start Date End Date Taking? Authorizing Provider  amoxicillin-clavulanate (AUGMENTIN) 875-125 MG tablet Take 1 tablet by mouth every 12 (twelve) hours for 7 days. 08/23/21 08/30/21 Yes Eliezer Lofts, FNP  albuterol (PROVENTIL) (2.5 MG/3ML) 0.083% nebulizer solution Take 3 mLs (2.5 mg total) by nebulization every 6 (six) hours as needed for wheezing or shortness of breath. 05/10/19   Early, Coralee Pesa, NP  albuterol (VENTOLIN HFA) 108 (90 Base) MCG/ACT inhaler Inhale 1-2 puffs into the lungs every 4 (four) hours as needed for wheezing or shortness of breath. 11/27/20   Orma Render, NP  ALPRAZolam Duanne Moron) 0.25 MG tablet Take 1 tablet (0.25 mg total) by mouth 2 (two) times daily as needed for anxiety. 11/27/20   Orma Render, NP  aspirin EC 81 MG tablet Take 1 tablet (81 mg total) by mouth daily. Swallow whole. 09/18/19   Lelon Perla, MD  Azelaic Acid 15 % gel After skin is thoroughly washed and patted dry, gently but thoroughly massage a thin film of azelaic acid cream into the affected area twice daily, in the morning and evening. 08/04/20   Early, Coralee Pesa, NP  azelastine (ASTELIN) 0.1 % nasal spray Place 1 spray into both nostrils daily as needed for rhinitis. 05/10/19   Orma Render, NP  B Complex Vitamins (VITAMIN B COMPLEX PO) Take 1 tablet by mouth daily.    [provider]  benazepril (LOTENSIN) 10 MG tablet Take 1 tablet (10 mg total) by mouth daily. 03/26/21   Orma Render, NP  celecoxib (CELEBREX) 200 MG capsule Take 1 capsule twice a day by mouth for 30 days. 06/07/21     Continuous Blood Gluc Sensor (FREESTYLE LIBRE 3 SENSOR) MISC Place 1 sensor on the skin every 14 days. Use to check glucose continuously 06/15/21   Early, Coralee Pesa, NP  COVID-19 mRNA bivalent vaccine, Moderna, (MODERNA COVID-19 BIVAL BOOSTER) 50 MCG/0.5ML injection Inject into the muscle. 12/07/20   Carlyle Basques, MD  desloratadine (CLARINEX) 5 MG tablet  Take 1 tablet (5 mg total) by mouth daily. 02/12/21   Orma Render, NP  fluticasone furoate-vilanterol (BREO ELLIPTA) 100-25 MCG/ACT AEPB Inhale 1 puff into the lungs once daily. 03/23/21   Orma Render, NP  levothyroxine (SYNTHROID) 88 MCG tablet Take 1 tablet (88 mcg) by mouth daily before breakfast Monday through Saturday, and take 2 tablets (176 mcg) before breakfast on Sundays. 03/25/21   Orma Render, NP  Menthol, Topical Analgesic, (ICY HOT EX) Apply 1 application topically daily as needed (pain).    [provider]  metFORMIN (GLUCOPHAGE) 500 MG tablet Take 1 tablet (500 mg total) by mouth 2 (two) times daily with a meal for 30 days, THEN 2 tablets (1,000  mg total) 2 (two) times daily with a meal. 06/14/21 09/12/21  Early, Coralee Pesa, NP  methylPREDNISolone (MEDROL) 4 MG TBPK tablet Take 1 dose pk every day by moutharound the clock for 6 days. 06/07/21     metoprolol succinate (TOPROL-XL) 100 MG 24 hr tablet Take 1 tablet (100 mg total) by mouth daily. Take with or immediately following a meal. 12/22/20   Early, Coralee Pesa, NP  ondansetron (ZOFRAN-ODT) 4 MG disintegrating tablet Place 1 tablet every 8 hours under the tongue as needed. 04/05/21     oxyCODONE (OXY IR/ROXICODONE) 5 MG immediate release tablet Take 1 tablet every 4 hours by oral route as needed. 04/13/21     pantoprazole (PROTONIX) 40 MG tablet Take 1 tablet by mouth everyday. 03/05/21   Orma Render, NP  rosuvastatin (CRESTOR) 40 MG tablet Take 1 tablet (40 mg total) by mouth daily. 09/18/19   Lelon Perla, MD  traMADol (ULTRAM) 50 MG tablet Take 1 tablet(s) EVERY 6 HOURS as needed for pain. 04/20/21     umeclidinium bromide (INCRUSE ELLIPTA) 62.5 MCG/ACT AEPB Inhale 1 puff into the lungs once daily. 09/14/20   Orma Render, NP  venlafaxine XR (EFFEXOR-XR) 75 MG 24 hr capsule TAKE 3 CAPSULES (225 MG TOTAL) BY MOUTH DAILY WITH BREAKFAST. 01/14/21   Orma Render, NP  Vitamin D, Ergocalciferol, (DRISDOL) 1.25 MG (50000 UNIT) CAPS capsule  Take 1 capsule (50,000 Units total) by mouth every 7 (seven) days. 03/25/21   Orma Render, NP    Family History Family History  Problem Relation Age of Onset   Hypertension Mother    Heart disease Father    Hyperlipidemia Father    Hypertension Father    Alcohol abuse Brother    Diabetes Maternal Grandmother    Alcohol abuse Maternal Uncle    Stroke Maternal Uncle    Ovarian cancer Daughter     Social History Social History   Tobacco Use   Smoking status: Former    Types: Cigarettes    Quit date: 02/15/2004    Years since quitting: 17.5   Smokeless tobacco: Never  Vaping Use   Vaping Use: Never used  Substance Use Topics   Alcohol use: Yes    Comment: occasional   Drug use: No     Allergies   Influenza virus vacc split pf and Lipitor [atorvastatin]   Review of Systems Review of Systems  HENT:  Positive for congestion, postnasal drip, sinus pressure and sinus pain.   Genitourinary:  Positive for frequency.  All other systems reviewed and are negative.    Physical Exam Triage Vital Signs ED Triage Vitals  Enc Vitals Group     BP      Pulse      Resp      Temp      Temp src      SpO2      Weight      Height      Head Circumference      Peak Flow      Pain Score      Pain Loc      Pain Edu?      Excl. in [place]?    No data found.  Updated Vital Signs BP (!) 152/93 (BP Location: Right Arm)   Pulse 79   Temp 98.1 F (36.7 C) (Oral)   Resp 20   Ht 5' 4"  (1.626 m)   Wt 214 lb (97.1 kg)   SpO2 98%  BMI 36.73 kg/m   Visual Acuity Right Eye Distance:   Left Eye Distance:   Bilateral Distance:    Right Eye Near:   Left Eye Near:    Bilateral Near:     Physical Exam Vitals and nursing note reviewed.  Constitutional:      General: She is not in acute distress.    Appearance: She is obese. She is not ill-appearing.  HENT:     Head: Normocephalic and atraumatic.     Right Ear: Tympanic membrane and external ear normal.     Left Ear: Tympanic  membrane and external ear normal.     Ears:     Comments: Moderate eustachian tube dysfunction noted bilaterally    Nose:     Right Sinus: Maxillary sinus tenderness present.     Left Sinus: Maxillary sinus tenderness present.     Mouth/Throat:     Mouth: Mucous membranes are moist.     Pharynx: Oropharynx is clear.     Comments: Moderate amount of clear drainage of posterior oropharynx noted Eyes:     Extraocular Movements: Extraocular movements intact.     Conjunctiva/sclera: Conjunctivae normal.     Pupils: Pupils are equal, round, and reactive to light.  Cardiovascular:     Rate and Rhythm: Normal rate and regular rhythm.     Pulses: Normal pulses.     Heart sounds: Normal heart sounds. No murmur heard.    No friction rub. No gallop.  Pulmonary:     Effort: Pulmonary effort is normal.     Breath sounds: Normal breath sounds. No wheezing, rhonchi or rales.  Abdominal:     Tenderness: There is no right CVA tenderness or left CVA tenderness.  Musculoskeletal:     Cervical back: Normal range of motion and neck supple.  Skin:    General: Skin is warm and dry.  Neurological:     General: No focal deficit present.     Mental Status: She is alert and oriented to person, place, and time. Mental status is at baseline.      UC Treatments / Results  Labs (all labs ordered are listed, but only abnormal results are displayed) Labs Reviewed  POCT URINALYSIS DIP (MANUAL ENTRY) - Abnormal; Notable for the following components:      Result Value   Glucose, UA =100 (*)    All other components within normal limits  URINE CULTURE    EKG   Radiology No results found.  Procedures Procedures (including critical care time)  Medications Ordered in UC Medications - No data to display  Initial Impression / Assessment and Plan / UC Course  I have reviewed the triage vital signs and the nursing notes.  Pertinent labs & imaging results that were available during my care of the  patient were reviewed by me and considered in my medical decision making (see chart for details).     MDM: 1.  Urinary frequency-UA unremarkable, urine culture ordered; 2.  Subacute maxillary sinusitis-Rx'd Augmentin. Instructed patient to take medication as directed with food to completion.  Encouraged patient to increase daily water intake while taking this medication.  Advised patient we will follow-up with urine culture results once received.  Advised if symptoms worsen and/or unresolved please follow-up PCP or here for further evaluation.  Patient discharged home, hemodynamically stable.  Final Clinical Impressions(s) / UC Diagnoses   Final diagnoses:  Urinary frequency  Subacute maxillary sinusitis     Discharge Instructions  Instructed patient to take medication as directed with food to completion.  Encouraged patient to increase daily water intake while taking this medication.  Advised patient we will follow-up with urine culture results once received.  Advised if symptoms worsen and/or unresolved please follow-up PCP or here for further evaluation.     ED Prescriptions     Medication Sig Dispense Auth. Provider   amoxicillin-clavulanate (AUGMENTIN) 875-125 MG tablet Take 1 tablet by mouth every 12 (twelve) hours for 7 days. 14 tablet Eliezer Lofts, FNP      PDMP not reviewed this encounter.   Eliezer Lofts, [place] 08/23/21 1238

## 2021-08-23 NOTE — ED Triage Notes (Signed)
Pt presents to Urgent Care with c/o having abnormal UA when routine labs were done for a weight loss program she is joining. 08/21/21 UA showed small leucocytes and positive nitrite. Pt states she has noticed urinary frequency but attributed it to drinking more water last week.

## 2021-08-24 LAB — URINE CULTURE
MICRO NUMBER:: 13624937
SPECIMEN QUALITY:: ADEQUATE

## 2021-08-26 ENCOUNTER — Other Ambulatory Visit (HOSPITAL_BASED_OUTPATIENT_CLINIC_OR_DEPARTMENT_OTHER): Payer: Self-pay | Admitting: Nurse Practitioner

## 2021-08-26 DIAGNOSIS — B3731 Acute candidiasis of vulva and vagina: Secondary | ICD-10-CM

## 2021-08-26 MED ORDER — FLUCONAZOLE 150 MG PO TABS
ORAL_TABLET | ORAL | 2 refills | Status: DC
  Filled 2021-08-26: qty 2, 3d supply, fill #0

## 2021-08-27 ENCOUNTER — Other Ambulatory Visit (HOSPITAL_BASED_OUTPATIENT_CLINIC_OR_DEPARTMENT_OTHER): Payer: Self-pay

## 2021-08-27 ENCOUNTER — Other Ambulatory Visit (HOSPITAL_BASED_OUTPATIENT_CLINIC_OR_DEPARTMENT_OTHER): Payer: Self-pay | Admitting: Nurse Practitioner

## 2021-08-27 DIAGNOSIS — E890 Postprocedural hypothyroidism: Secondary | ICD-10-CM

## 2021-08-31 ENCOUNTER — Other Ambulatory Visit (HOSPITAL_BASED_OUTPATIENT_CLINIC_OR_DEPARTMENT_OTHER): Payer: Self-pay | Admitting: Nurse Practitioner

## 2021-08-31 ENCOUNTER — Other Ambulatory Visit (HOSPITAL_BASED_OUTPATIENT_CLINIC_OR_DEPARTMENT_OTHER): Payer: Self-pay

## 2021-08-31 DIAGNOSIS — M19011 Primary osteoarthritis, right shoulder: Secondary | ICD-10-CM

## 2021-08-31 MED ORDER — LEVOTHYROXINE SODIUM 88 MCG PO TABS
ORAL_TABLET | ORAL | 3 refills | Status: DC
  Filled 2021-08-31: qty 35, 30d supply, fill #0
  Filled 2021-09-27: qty 35, 30d supply, fill #1
  Filled 2021-11-08: qty 35, 30d supply, fill #2
  Filled 2021-12-21: qty 35, 30d supply, fill #3

## 2021-08-31 MED ORDER — CELECOXIB 200 MG PO CAPS
200.0000 mg | ORAL_CAPSULE | Freq: Every day | ORAL | 1 refills | Status: DC
  Filled 2021-08-31: qty 30, 30d supply, fill #0
  Filled 2021-09-27: qty 30, 30d supply, fill #1
  Filled 2021-10-25: qty 27, 27d supply, fill #2
  Filled 2021-10-25: qty 3, 3d supply, fill #2
  Filled 2021-12-01: qty 30, 30d supply, fill #3
  Filled 2022-01-28: qty 30, 30d supply, fill #4
  Filled 2022-04-15: qty 30, 30d supply, fill #5

## 2021-09-08 ENCOUNTER — Telehealth (HOSPITAL_BASED_OUTPATIENT_CLINIC_OR_DEPARTMENT_OTHER): Payer: Self-pay | Admitting: Nurse Practitioner

## 2021-09-08 NOTE — Telephone Encounter (Signed)
Key: [id number]. PA processed via covermymeds. Outcome approved.

## 2021-09-09 ENCOUNTER — Other Ambulatory Visit (HOSPITAL_BASED_OUTPATIENT_CLINIC_OR_DEPARTMENT_OTHER): Payer: Self-pay

## 2021-09-17 ENCOUNTER — Other Ambulatory Visit (HOSPITAL_BASED_OUTPATIENT_CLINIC_OR_DEPARTMENT_OTHER): Payer: Self-pay

## 2021-09-24 ENCOUNTER — Other Ambulatory Visit (HOSPITAL_BASED_OUTPATIENT_CLINIC_OR_DEPARTMENT_OTHER): Payer: Self-pay

## 2021-09-24 ENCOUNTER — Other Ambulatory Visit (HOSPITAL_BASED_OUTPATIENT_CLINIC_OR_DEPARTMENT_OTHER): Payer: Self-pay | Admitting: Nurse Practitioner

## 2021-09-24 DIAGNOSIS — K2960 Other gastritis without bleeding: Secondary | ICD-10-CM

## 2021-09-24 MED ORDER — METHYLPREDNISOLONE 4 MG PO TBPK
ORAL_TABLET | ORAL | 0 refills | Status: DC
  Filled 2021-09-24: qty 21, 6d supply, fill #0

## 2021-09-27 ENCOUNTER — Other Ambulatory Visit (HOSPITAL_BASED_OUTPATIENT_CLINIC_OR_DEPARTMENT_OTHER): Payer: Self-pay

## 2021-09-27 MED ORDER — PANTOPRAZOLE SODIUM 40 MG PO TBEC
DELAYED_RELEASE_TABLET | ORAL | 1 refills | Status: DC
  Filled 2021-09-27: qty 30, 30d supply, fill #0
  Filled 2021-11-08: qty 30, 30d supply, fill #1
  Filled 2021-12-21: qty 30, 30d supply, fill #2
  Filled 2022-01-28: qty 30, 30d supply, fill #3

## 2021-09-30 ENCOUNTER — Ambulatory Visit (HOSPITAL_COMMUNITY)
Admission: RE | Admit: 2021-09-30 | Discharge: 2021-09-30 | Disposition: A | Discharge status: Home / Self Care | Payer: 59 | Source: Ambulatory Visit | Attending: Cardiology | Admitting: Cardiology

## 2021-09-30 DIAGNOSIS — I6521 Occlusion and stenosis of right carotid artery: Secondary | ICD-10-CM | POA: Diagnosis not present

## 2021-10-01 ENCOUNTER — Other Ambulatory Visit: Payer: Self-pay

## 2021-10-01 DIAGNOSIS — I6521 Occlusion and stenosis of right carotid artery: Secondary | ICD-10-CM

## 2021-10-14 ENCOUNTER — Other Ambulatory Visit (HOSPITAL_BASED_OUTPATIENT_CLINIC_OR_DEPARTMENT_OTHER): Payer: Self-pay

## 2021-10-25 ENCOUNTER — Other Ambulatory Visit (HOSPITAL_BASED_OUTPATIENT_CLINIC_OR_DEPARTMENT_OTHER): Payer: Self-pay | Admitting: Nurse Practitioner

## 2021-10-25 ENCOUNTER — Other Ambulatory Visit (HOSPITAL_BASED_OUTPATIENT_CLINIC_OR_DEPARTMENT_OTHER): Payer: Self-pay

## 2021-10-25 DIAGNOSIS — I10 Essential (primary) hypertension: Secondary | ICD-10-CM

## 2021-10-25 MED ORDER — BENAZEPRIL HCL 10 MG PO TABS
10.0000 mg | ORAL_TABLET | Freq: Every day | ORAL | 1 refills | Status: DC
  Filled 2021-10-25: qty 30, 30d supply, fill #0
  Filled 2021-12-01: qty 30, 30d supply, fill #1
  Filled 2021-12-21: qty 30, 30d supply, fill #2
  Filled 2022-01-28: qty 30, 30d supply, fill #3

## 2021-10-25 MED ORDER — INCRUSE ELLIPTA 62.5 MCG/ACT IN AEPB
INHALATION_SPRAY | RESPIRATORY_TRACT | 5 refills | Status: DC
  Filled 2021-10-25 – 2022-01-28 (×2): qty 30, 30d supply, fill #0

## 2021-10-26 ENCOUNTER — Other Ambulatory Visit (HOSPITAL_BASED_OUTPATIENT_CLINIC_OR_DEPARTMENT_OTHER): Payer: Self-pay

## 2021-10-26 ENCOUNTER — Encounter (HOSPITAL_BASED_OUTPATIENT_CLINIC_OR_DEPARTMENT_OTHER): Payer: Self-pay | Admitting: Pharmacist

## 2021-10-29 ENCOUNTER — Other Ambulatory Visit (HOSPITAL_BASED_OUTPATIENT_CLINIC_OR_DEPARTMENT_OTHER): Payer: Self-pay

## 2021-11-04 ENCOUNTER — Other Ambulatory Visit (HOSPITAL_BASED_OUTPATIENT_CLINIC_OR_DEPARTMENT_OTHER): Payer: Self-pay

## 2021-11-08 ENCOUNTER — Other Ambulatory Visit (HOSPITAL_BASED_OUTPATIENT_CLINIC_OR_DEPARTMENT_OTHER): Payer: Self-pay

## 2021-11-10 ENCOUNTER — Ambulatory Visit (INDEPENDENT_AMBULATORY_CARE_PROVIDER_SITE_OTHER): Payer: 59 | Admitting: Nurse Practitioner

## 2021-11-10 ENCOUNTER — Other Ambulatory Visit (HOSPITAL_BASED_OUTPATIENT_CLINIC_OR_DEPARTMENT_OTHER): Payer: Self-pay

## 2021-11-10 ENCOUNTER — Encounter (HOSPITAL_BASED_OUTPATIENT_CLINIC_OR_DEPARTMENT_OTHER): Payer: Self-pay | Admitting: Nurse Practitioner

## 2021-11-10 VITALS — BP 165/72 | HR 65 | Ht 64.0 in | Wt 207.4 lb

## 2021-11-10 DIAGNOSIS — J0140 Acute pansinusitis, unspecified: Secondary | ICD-10-CM | POA: Diagnosis not present

## 2021-11-10 DIAGNOSIS — B3731 Acute candidiasis of vulva and vagina: Secondary | ICD-10-CM

## 2021-11-10 DIAGNOSIS — Z6836 Body mass index (BMI) 36.0-36.9, adult: Secondary | ICD-10-CM | POA: Diagnosis not present

## 2021-11-10 MED ORDER — SPIRIVA RESPIMAT 2.5 MCG/ACT IN AERS
INHALATION_SPRAY | RESPIRATORY_TRACT | 11 refills | Status: DC
Start: 2021-11-10 — End: 2022-11-25
  Filled 2021-11-10: qty 4, 30d supply, fill #0
  Filled 2022-01-28: qty 4, 30d supply, fill #1
  Filled 2022-10-26: qty 4, 30d supply, fill #2

## 2021-11-10 MED ORDER — LEVOFLOXACIN 500 MG PO TABS
500.0000 mg | ORAL_TABLET | Freq: Every day | ORAL | 0 refills | Status: AC
  Filled 2021-11-10: qty 7, 7d supply, fill #0

## 2021-11-10 MED ORDER — FLUCONAZOLE 150 MG PO TABS
ORAL_TABLET | ORAL | 2 refills | Status: DC
  Filled 2021-11-10: qty 2, 2d supply, fill #0
  Filled 2021-12-08: qty 2, 2d supply, fill #1

## 2021-11-10 NOTE — Progress Notes (Signed)
  Orma Render, DNP, AGNP-c [facility] [address]  [place], [place] [address] [phone]  Subjective:   [name] is a 61 y.o. female presents to day for evaluation of: Acute Visit (Patient presents today with sinuse pressure x 1 week. Facial pain, left ear pain. Patient denies fever)  There are no diagnoses linked to this encounter.  Left sinus pain and pressure Ear pain and fullness Taking mucinex Has deviated septum Larkin Ina- downstairs was rude and shortened the orientation told her that she should push through pain On meta weight loss- covered by insurance- lost 18lb   PMH, Medications, and Allergies reviewed and updated in chart as appropriate.   ROS negative except for what is listed in HPI. Objective:  BP (!) 165/72   Pulse 65   Ht 5' 4"  (1.626 m)   Wt 207 lb 6.4 oz (94.1 kg)   SpO2 99%   BMI 35.60 kg/m  Physical Exam        Assessment & Plan:   Problem List Items Addressed This Visit   None     Orma Render, DNP, AGNP-c 11/10/2021  9:49 AM    History, Medications, Surgery, SDOH, and Family History reviewed and updated as appropriate.

## 2021-11-10 NOTE — Patient Instructions (Signed)
Please get better soon!   Let me know if you feel like you need steroids later in the week.

## 2021-11-11 NOTE — Assessment & Plan Note (Signed)
Doing fabulous with weight loss efforts! Down 18 lbs. Continue current exercise and diet changes.

## 2021-11-11 NOTE — Assessment & Plan Note (Signed)
Symptoms consistent with sinusitis. Abx sent to pharmacy. Supportive care instructions provided. F/U if no improvement in next few days. Consider steroid burst if she is not showing improvement. OK to sent 29m every morning for 5 days- historically she has required this for full resolution.

## 2021-11-12 ENCOUNTER — Encounter (HOSPITAL_BASED_OUTPATIENT_CLINIC_OR_DEPARTMENT_OTHER): Payer: Self-pay | Admitting: Nurse Practitioner

## 2021-11-13 ENCOUNTER — Encounter: Payer: Self-pay | Admitting: Sports Medicine

## 2021-11-14 ENCOUNTER — Other Ambulatory Visit (HOSPITAL_BASED_OUTPATIENT_CLINIC_OR_DEPARTMENT_OTHER): Payer: Self-pay

## 2021-11-14 MED ORDER — PREDNISONE 50 MG PO TABS
50.0000 mg | ORAL_TABLET | Freq: Every day | ORAL | 0 refills | Status: DC
Start: 2021-11-14 — End: 2021-11-22
  Filled 2021-11-14: qty 5, 5d supply, fill #0

## 2021-11-15 ENCOUNTER — Other Ambulatory Visit (HOSPITAL_BASED_OUTPATIENT_CLINIC_OR_DEPARTMENT_OTHER): Payer: Self-pay | Admitting: Nurse Practitioner

## 2021-11-15 ENCOUNTER — Other Ambulatory Visit (HOSPITAL_BASED_OUTPATIENT_CLINIC_OR_DEPARTMENT_OTHER): Payer: Self-pay

## 2021-11-15 ENCOUNTER — Telehealth (HOSPITAL_BASED_OUTPATIENT_CLINIC_OR_DEPARTMENT_OTHER): Payer: Self-pay

## 2021-11-15 DIAGNOSIS — L719 Rosacea, unspecified: Secondary | ICD-10-CM

## 2021-11-15 NOTE — Telephone Encounter (Signed)
Received letter from [facility] patient was denied for Incruse ellipta 62.5 mcg BLST W/DEV. I placed in providers box to see if she would like me to appeal the decision.

## 2021-11-17 ENCOUNTER — Other Ambulatory Visit (HOSPITAL_BASED_OUTPATIENT_CLINIC_OR_DEPARTMENT_OTHER): Payer: Self-pay

## 2021-11-17 DIAGNOSIS — L719 Rosacea, unspecified: Secondary | ICD-10-CM

## 2021-11-17 MED ORDER — AZELAIC ACID 15 % EX GEL
CUTANEOUS | 6 refills | Status: DC
  Filled 2021-11-17: qty 50, 30d supply, fill #0
  Filled 2022-01-28: qty 50, 30d supply, fill #1

## 2021-11-22 ENCOUNTER — Ambulatory Visit (INDEPENDENT_AMBULATORY_CARE_PROVIDER_SITE_OTHER): Payer: 59 | Admitting: Nurse Practitioner

## 2021-11-22 ENCOUNTER — Other Ambulatory Visit (HOSPITAL_BASED_OUTPATIENT_CLINIC_OR_DEPARTMENT_OTHER): Payer: Self-pay

## 2021-11-22 DIAGNOSIS — J329 Chronic sinusitis, unspecified: Secondary | ICD-10-CM

## 2021-11-22 MED ORDER — LEVOFLOXACIN 500 MG PO TABS
500.0000 mg | ORAL_TABLET | Freq: Every day | ORAL | 0 refills | Status: AC
  Filled 2021-11-22 – 2022-07-13 (×2): qty 7, 7d supply, fill #0

## 2021-11-22 MED ORDER — AMOXICILLIN-POT CLAVULANATE 875-125 MG PO TABS
1.0000 | ORAL_TABLET | Freq: Two times a day (BID) | ORAL | 0 refills | Status: DC
  Filled 2021-11-22: qty 20, 10d supply, fill #0

## 2021-11-22 MED ORDER — PREDNISONE 50 MG PO TABS
50.0000 mg | ORAL_TABLET | Freq: Every day | ORAL | 0 refills | Status: DC
  Filled 2021-11-22: qty 5, 5d supply, fill #0

## 2021-11-22 NOTE — Progress Notes (Signed)
Spoke with patient on the phone.  She continues to have sinus pain and pressure along with congestion.  Her pain is much worse on the left side. She reports the entire sinus area feels swollen. Slight improvement with previous antibiotic use.  We will plan to resend antibiotics and steroids for treatment.  Follow-up in 1 week if no improvement.

## 2021-11-23 ENCOUNTER — Encounter (HOSPITAL_BASED_OUTPATIENT_CLINIC_OR_DEPARTMENT_OTHER): Payer: Self-pay | Admitting: Nurse Practitioner

## 2021-12-01 ENCOUNTER — Other Ambulatory Visit (HOSPITAL_BASED_OUTPATIENT_CLINIC_OR_DEPARTMENT_OTHER): Payer: Self-pay

## 2021-12-08 ENCOUNTER — Other Ambulatory Visit (HOSPITAL_BASED_OUTPATIENT_CLINIC_OR_DEPARTMENT_OTHER): Payer: Self-pay | Admitting: Nurse Practitioner

## 2021-12-08 ENCOUNTER — Other Ambulatory Visit (HOSPITAL_BASED_OUTPATIENT_CLINIC_OR_DEPARTMENT_OTHER): Payer: Self-pay

## 2021-12-08 DIAGNOSIS — F418 Other specified anxiety disorders: Secondary | ICD-10-CM

## 2021-12-08 DIAGNOSIS — J4540 Moderate persistent asthma, uncomplicated: Secondary | ICD-10-CM

## 2021-12-09 ENCOUNTER — Other Ambulatory Visit (HOSPITAL_BASED_OUTPATIENT_CLINIC_OR_DEPARTMENT_OTHER): Payer: Self-pay

## 2021-12-09 MED ORDER — ALBUTEROL SULFATE HFA 108 (90 BASE) MCG/ACT IN AERS
1.0000 | INHALATION_SPRAY | RESPIRATORY_TRACT | 3 refills | Status: DC | PRN
  Filled 2021-12-09: qty 8.5, 17d supply, fill #0
  Filled 2022-03-13: qty 6.7, 17d supply, fill #0

## 2021-12-09 MED ORDER — ALPRAZOLAM 0.25 MG PO TABS: 0.2500 mg | ORAL_TABLET | Freq: Two times a day (BID) | ORAL | 2 refills | Status: DC | PRN

## 2021-12-17 DIAGNOSIS — M2241 Chondromalacia patellae, right knee: Secondary | ICD-10-CM | POA: Insufficient documentation

## 2021-12-20 ENCOUNTER — Other Ambulatory Visit (HOSPITAL_BASED_OUTPATIENT_CLINIC_OR_DEPARTMENT_OTHER): Payer: Self-pay

## 2021-12-21 ENCOUNTER — Other Ambulatory Visit (HOSPITAL_BASED_OUTPATIENT_CLINIC_OR_DEPARTMENT_OTHER): Payer: Self-pay

## 2021-12-21 ENCOUNTER — Other Ambulatory Visit (HOSPITAL_COMMUNITY): Payer: Self-pay

## 2022-01-04 ENCOUNTER — Other Ambulatory Visit (HOSPITAL_BASED_OUTPATIENT_CLINIC_OR_DEPARTMENT_OTHER): Payer: Self-pay | Admitting: Nurse Practitioner

## 2022-01-04 DIAGNOSIS — I10 Essential (primary) hypertension: Secondary | ICD-10-CM

## 2022-01-05 ENCOUNTER — Other Ambulatory Visit (HOSPITAL_BASED_OUTPATIENT_CLINIC_OR_DEPARTMENT_OTHER): Payer: Self-pay

## 2022-01-05 ENCOUNTER — Other Ambulatory Visit (HOSPITAL_COMMUNITY): Payer: Self-pay

## 2022-01-05 MED ORDER — METOPROLOL SUCCINATE ER 100 MG PO TB24
100.0000 mg | ORAL_TABLET | Freq: Every day | ORAL | 0 refills | Status: DC
  Filled 2022-01-05: qty 30, 30d supply, fill #0

## 2022-01-08 ENCOUNTER — Encounter: Payer: Self-pay | Admitting: Emergency Medicine

## 2022-01-08 ENCOUNTER — Ambulatory Visit
Admission: EM | Admit: 2022-01-08 | Discharge: 2022-01-08 | Disposition: A | Discharge status: Home / Self Care | Payer: 59 | Attending: Family Medicine | Admitting: Family Medicine

## 2022-01-08 DIAGNOSIS — J329 Chronic sinusitis, unspecified: Secondary | ICD-10-CM

## 2022-01-08 DIAGNOSIS — J3089 Other allergic rhinitis: Secondary | ICD-10-CM

## 2022-01-08 MED ORDER — PREDNISONE 50 MG PO TABS: 50.0000 mg | ORAL_TABLET | Freq: Every day | ORAL | 0 refills | Status: DC

## 2022-01-08 MED ORDER — DOXYCYCLINE HYCLATE 100 MG PO CAPS: 100.0000 mg | ORAL_CAPSULE | Freq: Two times a day (BID) | ORAL | 0 refills | Status: DC

## 2022-01-08 NOTE — ED Triage Notes (Signed)
Sinus congestion & pressure x 1 week  Just finished 2 rounds of antibiotics & steroids 1 month ago for sinus infection  Denies fever  Saline nasal spray & mucinex sinus max OTC  Cough started to day

## 2022-01-08 NOTE — Discharge Instructions (Signed)
Continue to drink lots of water Continue your Flonase and Astelin nasal sprays Add doxycycline 2 times a day.  Take doxycycline with food. Take prednisone once a day for 5 days. Follow-up with your primary care doctor if not improving in a week You may need to see an ear nose and throat doctor in follow-up if your sinuses continue to bother you

## 2022-01-08 NOTE — ED Provider Notes (Signed)
[name]    CSN: [id number] Arrival date & time: 01/08/22  1103      History   Chief Complaint Chief Complaint  Patient presents with   Facial Pain    HPI [name] is a 61 y.o. female.   HPI  Patient states that she always gets sinus infections once or twice a year.  She thinks it has to do with seasonal allergies.  This year, however, she has had 1 sinus infection after another that will not go away.  When taking it an antibiotic she feels somewhat better and then the symptoms recur within a couple of weeks.  She has been on Levaquin.  She has been on Augmentin.  She had been on a couple courses of prednisone.  Currently now has sinus congestion and pressure, left side of her face, postnasal drip, scratchy throat, coughing.  Not responding to over-the-counter medication.  Past Medical History:  Diagnosis Date   Abdominal bloating 11/05/2020   Achilles tendon contracture, right    Acute non-recurrent frontal sinusitis 12/24/2019   ALLERGIC RHINITIS 04/10/2008   Allergic rhinitis 04/10/2008   Qualifier: Diagnosis of  By: Burnice Logan  MD, Doretha Sou    Asthma    asthmatic bronchitis   Breast lump in female    Carpal tunnel syndrome of right wrist    Cellulitis 10/31/2019   Chronic right shoulder pain 05/07/2019   DEPRESSION 08/13/2008   Diabetes mellitus without complication ([place])    Fatigue 11/17/2015   Fatty liver disease, nonalcoholic 0/99/8338   Hepatitis    fatty liver   HYPERLIPIDEMIA 08/04/2006   HYPERTENSION 08/04/2006   HYPOTHYROIDISM 08/04/2006   Kidney injury    LOW BACK PAIN 08/04/2006   Moderate persistent asthma with (acute) exacerbation 06/27/2019   Neuromuscular disorder (HCC)    Patellofemoral syndrome, right 06/19/2019   PERIMENOPAUSAL SYNDROME 05/08/2008   Plantar fasciitis of right foot 04/12/2017   Plantar fasciitis, left 11/17/2015   Postmenopausal bleeding 07/17/2019   Biopsy and [place] c/w endometrial polyp.  Pt to see Dr. Rosana Hoes for surgical  consult.     Primary insomnia 10/31/2019   PVD (peripheral vascular disease) ([place])    occlusive, status post bifem bypass 2007   SOB (shortness of breath) 07/12/2016   Vaginal candidiasis 10/31/2019    Patient Active Problem List   Diagnosis Date Noted   Breast lump in female 06/20/2021   Closed head injury 04/21/2021   Osteoarthritis of right acromioclavicular joint 03/09/2021   Pain of finger of right hand 02/09/2021   Herpes zoster 01/25/2021   Subacromial impingement of right shoulder 12/31/2020   Moderate persistent asthma without complication 25/06/3974   Diarrhea 11/05/2020   Reflux gastritis 11/05/2020   Acute non-recurrent pansinusitis 08/04/2020   Rosacea 08/04/2020   Situational anxiety 05/11/2020   Body mass index (BMI) of 36.0-36.9 in adult 05/11/2020   Abnormality of right breast on screening mammogram 10/14/2019   DDD (degenerative disc disease), cervical 73/41/9379   Nonalcoholic steatohepatitis (NASH) 08/08/2019   Migraine with aura and without status migrainosus, not intractable 05/31/2017   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   Type 2 diabetes mellitus with vascular disease ([place]) 01/14/2015   Vitamin D deficiency 01/14/2015   Peripheral vascular disease ([place]) 07/15/2011   Hypothyroidism 08/04/2006   Dyslipidemia 08/04/2006   Essential hypertension 08/04/2006    Past Surgical History:  Procedure Laterality Date   CARPAL TUNNEL RELEASE Right 04/05/2016   Procedure: OPEN RIGHT CARPAL TUNNEL RELEASE;  Surgeon: Daleen Bo  Louanne Skye, MD;  Location: [facility];  Service: Orthopedics;  Laterality: Right;   CHOLECYSTECTOMY     FEMORAL BYPASS     bifem.   HAGLAND'S DEFORMITY EXCISION     PLANTAR FASCIA RELEASE Right 04/18/2018   Procedure: PLANTAR FASCIA RELEASE AND GASTROCNEMIUS RECESSION RIGHT;  Surgeon: Newt Minion, MD;  Location: [facility];  Service: Orthopedics;  Laterality: Right;   TUBAL LIGATION      OB History     Gravida  3   Para  3    Term  3   Preterm      AB      Living  2      SAB      IAB      Ectopic      Multiple      Live Births               Home Medications    Prior to Admission medications   Medication Sig Start Date End Date Taking? Authorizing Provider  doxycycline (VIBRAMYCIN) 100 MG capsule Take 1 capsule (100 mg total) by mouth 2 (two) times daily. 01/08/22  Yes Raylene Everts, MD  albuterol (PROVENTIL) (2.5 MG/3ML) 0.083% nebulizer solution Take 3 mLs (2.5 mg total) by nebulization every 6 (six) hours as needed for wheezing or shortness of breath. 05/10/19   Early, Coralee Pesa, NP  albuterol (VENTOLIN HFA) 108 (90 Base) MCG/ACT inhaler Inhale 1-2 puffs into the lungs every 4 (four) hours as needed for wheezing or shortness of breath. 12/09/21   Orma Render, NP  ALPRAZolam Duanne Moron) 0.25 MG tablet Take 1 tablet (0.25 mg total) by mouth 2 (two) times daily as needed for anxiety. 12/09/21   Orma Render, NP  aspirin EC 81 MG tablet Take 1 tablet (81 mg total) by mouth daily. Swallow whole. 09/18/19   Lelon Perla, MD  Azelaic Acid 15 % gel After skin is thoroughly washed and patted dry, gently but thoroughly massage a thin film of azelaic acid cream into the affected area twice daily, in the morning and evening. 11/17/21   Orma Render, NP  azelastine (ASTELIN) 0.1 % nasal spray Place 1 spray into both nostrils daily as needed for rhinitis. 05/10/19   Orma Render, NP  B Complex Vitamins (VITAMIN B COMPLEX PO) Take 1 tablet by mouth daily.    [provider]  benazepril (LOTENSIN) 10 MG tablet Take 1 tablet (10 mg total) by mouth daily. 10/25/21   Orma Render, NP  celecoxib (CELEBREX) 200 MG capsule Take 1 capsule (200 mg total) by mouth daily. 08/31/21   Orma Render, NP  Continuous Blood Gluc Sensor (FREESTYLE LIBRE 3 SENSOR) MISC Place 1 sensor on the skin every 14 days. Use to check glucose continuously 06/15/21   Early, Coralee Pesa, NP  COVID-19 mRNA bivalent vaccine, Moderna,  (MODERNA COVID-19 BIVAL BOOSTER) 50 MCG/0.5ML injection Inject into the muscle. 12/07/20   Carlyle Basques, MD  desloratadine (CLARINEX) 5 MG tablet Take 1 tablet (5 mg total) by mouth daily. 02/12/21   Orma Render, NP  fluticasone furoate-vilanterol (BREO ELLIPTA) 100-25 MCG/ACT AEPB Inhale 1 puff into the lungs once daily. 03/23/21   Orma Render, NP  levothyroxine (SYNTHROID) 88 MCG tablet Take 1 tablet (88 mcg) by mouth daily before breakfast Monday through Saturday, and take 2 tablets (176 mcg) before breakfast on Sundays. 08/31/21   Orma Render, NP  Menthol, Topical Analgesic, ([place]  EX) Apply 1 application topically daily as needed (pain).    [provider]  metoprolol succinate (TOPROL-XL) 100 MG 24 hr tablet Take 1 tablet (100 mg total) by mouth daily. Take with or immediately following a meal. 01/05/22   Early, Coralee Pesa, NP  pantoprazole (PROTONIX) 40 MG tablet Take 1 tablet by mouth everyday. 09/27/21   Orma Render, NP  predniSONE (DELTASONE) 50 MG tablet Take 1 tablet (50 mg total) by mouth daily for 5 days. 01/08/22   Raylene Everts, MD  rosuvastatin (CRESTOR) 40 MG tablet Take 1 tablet (40 mg total) by mouth daily. 09/18/19   Lelon Perla, MD  Tiotropium Bromide Monohydrate (SPIRIVA RESPIMAT) 2.5 MCG/ACT AERS Inhale 2 puffs into the lungs once daily. 11/10/21   Orma Render, NP  umeclidinium bromide (INCRUSE ELLIPTA) 62.5 MCG/ACT AEPB Inhale 1 puff into the lungs once daily. 10/25/21   Orma Render, NP  venlafaxine XR (EFFEXOR-XR) 75 MG 24 hr capsule TAKE 3 CAPSULES (225 MG TOTAL) BY MOUTH DAILY WITH BREAKFAST. 01/14/21   Orma Render, NP    Family History Family History  Problem Relation Age of Onset   Hypertension Mother    Heart disease Father    Hyperlipidemia Father    Hypertension Father    Alcohol abuse Brother    Diabetes Maternal Grandmother    Alcohol abuse Maternal Uncle    Stroke Maternal Uncle    Ovarian cancer Daughter     Social  History Social History   Tobacco Use   Smoking status: Former    Types: Cigarettes    Quit date: 02/15/2004    Years since quitting: 17.9   Smokeless tobacco: Never  Vaping Use   Vaping Use: Never used  Substance Use Topics   Alcohol use: Yes    Comment: occasional   Drug use: No     Allergies   Influenza virus vacc split pf and Lipitor [atorvastatin]   Review of Systems Review of Systems See HPI  Physical Exam Triage Vital Signs ED Triage Vitals  Enc Vitals Group     BP 01/08/22 1239 (!) 151/92     Pulse Rate 01/08/22 1239 74     Resp 01/08/22 1239 16     Temp 01/08/22 1239 99.6 F (37.6 C)     Temp Source 01/08/22 1239 Oral     SpO2 01/08/22 1239 98 %     Weight 01/08/22 1242 207 lb (93.9 kg)     Height 01/08/22 1242 5' 4"  (1.626 m)     Head Circumference --      Peak Flow --      Pain Score --      Pain Loc --      Pain Edu? --      Excl. in [place]? --    No data found.  Updated Vital Signs BP (!) 151/92 (BP Location: Left Arm)   Pulse 74   Temp 99.6 F (37.6 C) (Oral)   Resp 16   Ht 5' 4"  (1.626 m)   Wt 93.9 kg   SpO2 98%   BMI 35.53 kg/m      Physical Exam Constitutional:      General: She is not in acute distress.    Appearance: She is well-developed. She is ill-appearing.     Comments: Overweight.  Uncomfortable  HENT:     Head: Normocephalic and atraumatic.     Right Ear: Tympanic membrane and ear canal normal.  Left Ear: Tympanic membrane and ear canal normal.     Nose: Congestion and rhinorrhea present.     Comments: Left maxillary sinuses tender    Mouth/Throat:     Mouth: Mucous membranes are moist.     Pharynx: Posterior oropharyngeal erythema present.  Eyes:     Conjunctiva/sclera: Conjunctivae normal.     Pupils: Pupils are equal, round, and reactive to light.  Cardiovascular:     Rate and Rhythm: Normal rate and regular rhythm.     Heart sounds: Normal heart sounds.  Pulmonary:     Effort: Pulmonary effort is normal. No  respiratory distress.     Breath sounds: Normal breath sounds.  Abdominal:     General: There is no distension.     Palpations: Abdomen is soft.  Musculoskeletal:        General: Normal range of motion.     Cervical back: Normal range of motion.  Lymphadenopathy:     Cervical: No cervical adenopathy.  Skin:    General: Skin is warm and dry.  Neurological:     Mental Status: She is alert.      UC Treatments / Results  Labs (all labs ordered are listed, but only abnormal results are displayed) Labs Reviewed - No data to display  EKG   Radiology No results found.  Procedures Procedures (including critical care time)  Medications Ordered in UC Medications - No data to display  Initial Impression / Assessment and Plan / UC Course  I have reviewed the triage vital signs and the nursing notes.  Pertinent labs & imaging results that were available during my care of the patient were reviewed by me and considered in my medical decision making (see chart for details).     Final Clinical Impressions(s) / UC Diagnoses   Final diagnoses:  Chronic recurrent sinusitis     Discharge Instructions      Continue to drink lots of water Continue your Flonase and Astelin nasal sprays Add doxycycline 2 times a day.  Take doxycycline with food. Take prednisone once a day for 5 days. Follow-up with your primary care doctor if not improving in a week You may need to see an ear nose and throat doctor in follow-up if your sinuses continue to bother you   ED Prescriptions     Medication Sig Dispense Auth. Provider   predniSONE (DELTASONE) 50 MG tablet Take 1 tablet (50 mg total) by mouth daily for 5 days. 5 tablet Raylene Everts, MD   doxycycline (VIBRAMYCIN) 100 MG capsule Take 1 capsule (100 mg total) by mouth 2 (two) times daily. 20 capsule Raylene Everts, MD      PDMP not reviewed this encounter.   Raylene Everts, MD 01/08/22 1540

## 2022-01-17 ENCOUNTER — Encounter: Payer: 59 | Admitting: Nurse Practitioner

## 2022-01-28 ENCOUNTER — Other Ambulatory Visit: Payer: Self-pay | Admitting: Nurse Practitioner

## 2022-01-28 ENCOUNTER — Other Ambulatory Visit (HOSPITAL_BASED_OUTPATIENT_CLINIC_OR_DEPARTMENT_OTHER): Payer: Self-pay | Admitting: Nurse Practitioner

## 2022-01-28 ENCOUNTER — Other Ambulatory Visit (HOSPITAL_BASED_OUTPATIENT_CLINIC_OR_DEPARTMENT_OTHER): Payer: Self-pay

## 2022-01-28 ENCOUNTER — Other Ambulatory Visit: Payer: Self-pay

## 2022-01-28 DIAGNOSIS — F321 Major depressive disorder, single episode, moderate: Secondary | ICD-10-CM

## 2022-01-28 DIAGNOSIS — E890 Postprocedural hypothyroidism: Secondary | ICD-10-CM

## 2022-01-28 MED ORDER — LEVOTHYROXINE SODIUM 88 MCG PO TABS
ORAL_TABLET | ORAL | 3 refills | Status: DC
  Filled 2022-01-28: qty 35, 30d supply, fill #0

## 2022-01-28 MED ORDER — VENLAFAXINE HCL ER 75 MG PO CP24
225.0000 mg | ORAL_CAPSULE | Freq: Every day | ORAL | 3 refills | Status: DC
  Filled 2022-01-28: qty 90, 30d supply, fill #0
  Filled 2022-03-05: qty 90, 30d supply, fill #1
  Filled 2022-04-15: qty 18, 6d supply, fill #2
  Filled 2022-04-15: qty 72, 24d supply, fill #2
  Filled 2022-05-20: qty 90, 30d supply, fill #3
  Filled 2022-06-27: qty 90, 30d supply, fill #4
  Filled 2022-08-01: qty 90, 30d supply, fill #5
  Filled 2022-08-31: qty 90, 30d supply, fill #6
  Filled 2022-10-12: qty 90, 30d supply, fill #7
  Filled 2022-11-10: qty 90, 30d supply, fill #8
  Filled 2022-12-30 – 2022-12-31 (×2): qty 90, 30d supply, fill #9

## 2022-01-29 ENCOUNTER — Other Ambulatory Visit (HOSPITAL_BASED_OUTPATIENT_CLINIC_OR_DEPARTMENT_OTHER): Payer: Self-pay

## 2022-01-31 ENCOUNTER — Encounter: Payer: 59 | Admitting: Nurse Practitioner

## 2022-02-03 ENCOUNTER — Other Ambulatory Visit (HOSPITAL_BASED_OUTPATIENT_CLINIC_OR_DEPARTMENT_OTHER): Payer: Self-pay

## 2022-02-03 ENCOUNTER — Ambulatory Visit (INDEPENDENT_AMBULATORY_CARE_PROVIDER_SITE_OTHER): Payer: Managed Care, Other (non HMO) | Admitting: Nurse Practitioner

## 2022-02-03 ENCOUNTER — Encounter: Payer: Self-pay | Admitting: Nurse Practitioner

## 2022-02-03 VITALS — BP 128/78 | HR 72 | Ht 64.0 in | Wt 214.0 lb

## 2022-02-03 DIAGNOSIS — Z79899 Other long term (current) drug therapy: Secondary | ICD-10-CM

## 2022-02-03 DIAGNOSIS — E1159 Type 2 diabetes mellitus with other circulatory complications: Secondary | ICD-10-CM

## 2022-02-03 DIAGNOSIS — E890 Postprocedural hypothyroidism: Secondary | ICD-10-CM

## 2022-02-03 DIAGNOSIS — E785 Hyperlipidemia, unspecified: Secondary | ICD-10-CM | POA: Diagnosis not present

## 2022-02-03 DIAGNOSIS — K7581 Nonalcoholic steatohepatitis (NASH): Secondary | ICD-10-CM

## 2022-02-03 DIAGNOSIS — F418 Other specified anxiety disorders: Secondary | ICD-10-CM

## 2022-02-03 DIAGNOSIS — I10 Essential (primary) hypertension: Secondary | ICD-10-CM

## 2022-02-03 DIAGNOSIS — E559 Vitamin D deficiency, unspecified: Secondary | ICD-10-CM

## 2022-02-03 DIAGNOSIS — Z6836 Body mass index (BMI) 36.0-36.9, adult: Secondary | ICD-10-CM | POA: Diagnosis not present

## 2022-02-03 DIAGNOSIS — K2960 Other gastritis without bleeding: Secondary | ICD-10-CM

## 2022-02-03 MED ORDER — METOPROLOL SUCCINATE ER 100 MG PO TB24
100.0000 mg | ORAL_TABLET | Freq: Every day | ORAL | 3 refills | Status: DC
  Filled 2022-02-03: qty 30, 30d supply, fill #0
  Filled 2022-03-18 – 2022-03-19 (×2): qty 30, 30d supply, fill #1
  Filled 2022-04-11 – 2022-04-19 (×2): qty 30, 30d supply, fill #2
  Filled 2022-05-16 – 2022-05-17 (×2): qty 30, 30d supply, fill #3
  Filled 2022-06-15: qty 30, 30d supply, fill #4
  Filled 2022-07-15 – 2022-07-29 (×2): qty 30, 30d supply, fill #5
  Filled 2022-08-22: qty 30, 30d supply, fill #6
  Filled 2022-09-21: qty 30, 30d supply, fill #7
  Filled 2022-10-21: qty 30, 30d supply, fill #8
  Filled 2022-11-18: qty 30, 30d supply, fill #9
  Filled 2022-12-19: qty 30, 30d supply, fill #10

## 2022-02-03 MED ORDER — BENAZEPRIL HCL 10 MG PO TABS
10.0000 mg | ORAL_TABLET | Freq: Every day | ORAL | 3 refills | Status: DC
  Filled 2022-02-03: qty 90, 90d supply, fill #0
  Filled 2022-03-18 – 2022-03-19 (×2): qty 30, 30d supply, fill #0
  Filled 2022-04-11 – 2022-04-19 (×2): qty 30, 30d supply, fill #1
  Filled 2022-05-16 – 2022-05-17 (×2): qty 30, 30d supply, fill #2
  Filled 2022-06-15: qty 30, 30d supply, fill #3
  Filled 2022-07-15 – 2022-07-29 (×2): qty 30, 30d supply, fill #4
  Filled 2022-08-22: qty 30, 30d supply, fill #5
  Filled 2022-09-21: qty 30, 30d supply, fill #6
  Filled 2022-10-21: qty 30, 30d supply, fill #7
  Filled 2022-11-18: qty 30, 30d supply, fill #8
  Filled 2022-12-19: qty 30, 30d supply, fill #9

## 2022-02-03 MED ORDER — ALPRAZOLAM 0.25 MG PO TABS
0.2500 mg | ORAL_TABLET | Freq: Two times a day (BID) | ORAL | 2 refills | Status: DC | PRN
  Filled 2022-02-03: qty 60, 30d supply, fill #0

## 2022-02-03 MED ORDER — PANTOPRAZOLE SODIUM 40 MG PO TBEC
40.0000 mg | DELAYED_RELEASE_TABLET | Freq: Every day | ORAL | 3 refills | Status: DC
  Filled 2022-02-03: qty 90, fill #0
  Filled 2022-03-05: qty 30, 30d supply, fill #0
  Filled 2022-04-15: qty 30, 30d supply, fill #1
  Filled 2022-05-20: qty 30, 30d supply, fill #2
  Filled 2022-07-06: qty 30, 30d supply, fill #3
  Filled 2022-08-09: qty 30, 30d supply, fill #4
  Filled 2022-10-07: qty 30, 30d supply, fill #5
  Filled 2022-11-10: qty 30, 30d supply, fill #6
  Filled 2023-01-03 – 2023-01-14 (×2): qty 30, 30d supply, fill #7

## 2022-02-03 MED ORDER — ROSUVASTATIN CALCIUM 40 MG PO TABS
40.0000 mg | ORAL_TABLET | Freq: Every day | ORAL | 3 refills | Status: DC
  Filled 2022-02-03: qty 30, 30d supply, fill #0
  Filled 2022-03-23: qty 30, 30d supply, fill #1
  Filled 2022-06-14: qty 30, 30d supply, fill #2
  Filled 2022-08-07: qty 30, 30d supply, fill #3
  Filled 2022-10-07: qty 30, 30d supply, fill #4
  Filled 2022-11-10: qty 30, 30d supply, fill #5
  Filled 2023-01-03 – 2023-01-14 (×2): qty 30, 30d supply, fill #6

## 2022-02-03 NOTE — Progress Notes (Signed)
Worthy Keeler, DNP, AGNP-c [facility]  [address] [place], [place] [address] [phone]  ESTABLISHED PATIENT- Chronic Health and/or Follow-Up Visit  Blood pressure 128/78, pulse 72, height 5' 4"  (1.626 m), weight 214 lb (97.1 kg).    [name] is a 61 y.o. year old female presenting today for evaluation and management of the following: Fatigue and stress Kelsa tells me she has been dealing with increased stress related to caring for her husband and helping him manage multiple health issues. Her husbands declining health has also placed additional burden on her for routine household duties. She tells me she is worried about her husbands health and this has taken a toll on her own health. She is also still dealing with the emotional impact over the loss of her daughter and the difficulty with this during the holidays. She tells me she is still taking her effexor and feels like this is helping and she is using the xanax as needed. Recently, she feels she has had to use the xanax more frequently than she has in the past because of added stress.   All ROS negative with exception of what is listed above.   PHYSICAL EXAM Physical Exam Vitals and nursing note reviewed.  Constitutional:      General: She is not in acute distress.    Appearance: Normal appearance.  HENT:     Head: Normocephalic.  Eyes:     Extraocular Movements: Extraocular movements intact.     Conjunctiva/sclera: Conjunctivae normal.     Pupils: Pupils are equal, round, and reactive to light.  Neck:     Vascular: No carotid bruit.  Cardiovascular:     Rate and Rhythm: Normal rate and regular rhythm.     Pulses: Normal pulses.     Heart sounds: Normal heart sounds. No murmur heard. Pulmonary:     Effort: Pulmonary effort is normal.     Breath sounds: Normal breath sounds. No wheezing.  Abdominal:     General: Bowel sounds are normal. There is no distension.     Palpations: Abdomen is soft.      Tenderness: There is no abdominal tenderness. There is no guarding.  Musculoskeletal:        General: Normal range of motion.     Cervical back: Normal range of motion and neck supple.     Right lower leg: No edema.     Left lower leg: No edema.  Lymphadenopathy:     Cervical: No cervical adenopathy.  Skin:    General: Skin is warm and dry.     Capillary Refill: Capillary refill takes less than 2 seconds.  Neurological:     General: No focal deficit present.     Mental Status: She is alert and oriented to person, place, and time.  Psychiatric:        Mood and Affect: Mood normal.        Behavior: Behavior normal.        Thought Content: Thought content normal.        Judgment: Judgment normal.     PLAN Problem List Items Addressed This Visit     Hypothyroidism    Chronic. Will monitor labs today to ensure that levels are appropriate given her recent increase in anxiety levels. Continue current treatment.       Relevant Medications   metoprolol succinate (TOPROL-XL) 100 MG 24 hr tablet   Other Relevant Orders   CBC with Differential/Platelet (Completed)   Comprehensive metabolic panel (  Completed)   Hemoglobin A1c (Completed)   TSH (Completed)   T4, free (Completed)   VITAMIN D 25 Hydroxy (Vit-D Deficiency, Fractures) (Completed)   B12 and Folate Panel (Completed)   Dyslipidemia    Chronic. Labs pending today. She was previously not taking this medication, but I have encouraged her to restart this in an effort to be protect herself against cardiovascular damage. Refills provided today.       Relevant Medications   rosuvastatin (CRESTOR) 40 MG tablet   Other Relevant Orders   CBC with Differential/Platelet (Completed)   Comprehensive metabolic panel (Completed)   Hemoglobin A1c (Completed)   TSH (Completed)   T4, free (Completed)   VITAMIN D 25 Hydroxy (Vit-D Deficiency, Fractures) (Completed)   B12 and Folate Panel (Completed)   Essential hypertension     Chronic. BP stable today. We will get labs while she is here. No alarm sx present at this time. Continue current medication and at home monitoring along with diet and exercise.       Relevant Medications   rosuvastatin (CRESTOR) 40 MG tablet   benazepril (LOTENSIN) 10 MG tablet   metoprolol succinate (TOPROL-XL) 100 MG 24 hr tablet   Type 2 diabetes mellitus with vascular disease (HCC)    Chronic. We will monitor labs today given that her increased stress levels could contribute to poor control. Will plan to make changes to her plan of care as needed based on lab findings.       Relevant Medications   rosuvastatin (CRESTOR) 40 MG tablet   benazepril (LOTENSIN) 10 MG tablet   metoprolol succinate (TOPROL-XL) 100 MG 24 hr tablet   Other Relevant Orders   CBC with Differential/Platelet (Completed)   Comprehensive metabolic panel (Completed)   Hemoglobin A1c (Completed)   TSH (Completed)   T4, free (Completed)   VITAMIN D 25 Hydroxy (Vit-D Deficiency, Fractures) (Completed)   B12 and Folate Panel (Completed)   Vitamin D deficiency    Will monitor labs today      Relevant Orders   CBC with Differential/Platelet (Completed)   Comprehensive metabolic panel (Completed)   Hemoglobin A1c (Completed)   TSH (Completed)   T4, free (Completed)   VITAMIN D 25 Hydroxy (Vit-D Deficiency, Fractures) (Completed)   B12 and Folate Panel (Completed)   Nonalcoholic steatohepatitis (NASH)    No alarm sx at this time. We will monitor labs today. I have encouraged her to continue with her diet and exercise regimen to help with optimal control.       Relevant Orders   CBC with Differential/Platelet (Completed)   Comprehensive metabolic panel (Completed)   Hemoglobin A1c (Completed)   TSH (Completed)   T4, free (Completed)   VITAMIN D 25 Hydroxy (Vit-D Deficiency, Fractures) (Completed)   B12 and Folate Panel (Completed)   Situational anxiety    Exacerbation of anxiety related to health concerns  in both herself and her husband. Unfortunately, this has been a very difficult year, health wise, and she seems to have internalized much of this. She is taking her medication as prescribed and is mindful of taking xanax only when needed to help reduce risks. I will provide refills today. PDMP reviewed with no alarms. Recommend therapeutic techniques to help with daily management of stress and anxiety. Discussed importance of focusing on what she has control over and what changes she can make as opposed to changes she would like to see in others. I will follow closely and she will reach out if she has  any new or worsening symptoms. No alarm sx are present at this time.       Relevant Medications   ALPRAZolam (XANAX) 0.25 MG tablet   Other Relevant Orders   CBC with Differential/Platelet (Completed)   Comprehensive metabolic panel (Completed)   Hemoglobin A1c (Completed)   TSH (Completed)   T4, free (Completed)   VITAMIN D 25 Hydroxy (Vit-D Deficiency, Fractures) (Completed)   B12 and Folate Panel (Completed)   Body mass index (BMI) of 36.0-36.9 in adult - Primary    Recommend diet and exercise and continuation of current medication for control of chronic medical conditions. We discussed exercise activities that may be helpful for both weight and mental health management.       Relevant Orders   CBC with Differential/Platelet (Completed)   Comprehensive metabolic panel (Completed)   Hemoglobin A1c (Completed)   TSH (Completed)   T4, free (Completed)   VITAMIN D 25 Hydroxy (Vit-D Deficiency, Fractures) (Completed)   B12 and Folate Panel (Completed)   Reflux gastritis    Chronic. Refills provided today. Good control with current management.       Relevant Medications   pantoprazole (PROTONIX) 40 MG tablet   Other Visit Diagnoses     High risk medication use       Relevant Orders   CBC with Differential/Platelet (Completed)   Comprehensive metabolic panel (Completed)   Hemoglobin  A1c (Completed)   TSH (Completed)   T4, free (Completed)   VITAMIN D 25 Hydroxy (Vit-D Deficiency, Fractures) (Completed)   B12 and Folate Panel (Completed)       Return in about 6 months (around 08/05/2022) for 45.   Worthy Keeler, DNP, AGNP-c 02/03/2022 11:46 AM

## 2022-02-03 NOTE — Patient Instructions (Addendum)
MERRY CHRISTMAS!!!  Please take care of YOU!!!  I will be in touch with you about your labs. We may need to start the metformin again.    WEIGHT LOSS PLANNING AND DIABETES CONTROL Your progress today shows:     02/03/2022   11:46 AM 01/08/2022   12:42 PM 01/08/2022   12:39 PM  Vitals with BMI  Height 5' 4"  5' 4"    Weight 214 lbs 207 lbs   BMI 13.24 40.10   Systolic 272  536  Diastolic 78  92  Pulse 72  74    For best management of weight, it is vital to balance intake versus output. This means the number of calories burned per day must be less than the calories you take in with food and drink.   I recommend trying to follow a diet with the following: Calories: 1200-1500 calories per day Carbohydrates: 150-180 grams of carbohydrates per day  Why: Gives your body enough "quick fuel" for cells to maintain normal function without sending them into starvation mode.  Protein: At least 45-55 grams of protein per day Why: Protein takes longer and uses more energy than carbohydrates to break down for fuel. The carbohydrates in your meals serves as quick energy sources and proteins help use some of that extra quick energy to break down to produce long term energy. This helps you not feel hungry as quickly and protein breakdown burns calories.  Water: Drink AT LEAST 64 ounces of water per day  Why: Water is essential to healthy metabolism. Water helps to fill the stomach and keep you fuller longer. Water is required for healthy digestion and filtering of waste in the body.  Fat: Limit fats in your diet- when choosing fats, choose foods with lower fats content such as lean meats (chicken, fish, [place]).  Why: Increased fat intake leads to storage "for later". Once you burn your carbohydrate energy, your body goes into fat and protein breakdown mode to help you loose weight.  Cholesterol: Fats and oils that are LIQUID at room temperature are best. Choose vegetable oils (olive oil, avocado oil,  nuts). Avoid fats that are SOLID at room temperature (animal fats, processed meats). Healthy fats are often found in whole grains, beans, nuts, seeds, and berries.  Why: Elevated cholesterol levels lead to build up of cholesterol on the inside of your blood vessels. This will eventually cause the blood vessels to become hard and can lead to high blood pressure and damage to your organs. When the blood flow is reduced, but the pressure is high from cholesterol buildup, parts of the cholesterol can break off and form clots that can go to the brain or heart leading to a stroke or heart attack.  Fiber: Increase amount of SOLUBLE the fiber in your diet. This helps to fill you up, lowers cholesterol, and helps with digestion. Some foods high in soluble fiber are oats, peas, beans, apples, carrots, barley, and citrus fruits.   Why: Fiber fills you up, helps remove excess cholesterol, and aids in healthy digestion which are all very important in weight management.   I recommend the following as a minimum activity routine: Purposeful walk or other physical activity at least 20 minutes every single day. This means purposefully taking a walk, jog, bike, swim, treadmill, elliptical, dance, etc.  This activity should be ABOVE your normal daily activities, such as walking at work. Goal exercise should be at least 150 minutes a week- work your way up to this.  Heart Rate: Your maximum exercise heart rate should be 220 - Your Age in Years. When exercising, get your heart rate up, but avoid going over the maximum targeted heart rate.  60-70% of your maximum heart rate is where you tend to burn the most fat. To find this number:  220 - Age In Years= Max HR  Max HR x 0.6 (or 0.7) = Fat Burning HR The Fat Burning HR is your goal heart rate while working out to burn the most fat.  NEVER exercise to the point your feel lightheaded, weak, nauseated, dizzy. If you experience ANY of these symptoms- STOP exercise! Allow  yourself to cool down and your heart rate to come down. Then restart slower next time.  If at [place] you feel chest pain or chest pressure during exercise, STOP IMMEDIATELY and seek medical attention.

## 2022-02-04 LAB — COMPREHENSIVE METABOLIC PANEL
ALT: 36 IU/L — ABNORMAL HIGH (ref 0–32)
AST: 28 IU/L (ref 0–40)
Albumin/Globulin Ratio: 1.5 (ref 1.2–2.2)
Albumin: 4.1 g/dL (ref 3.9–4.9)
Alkaline Phosphatase: 128 IU/L — ABNORMAL HIGH (ref 44–121)
BUN/Creatinine Ratio: 8 — ABNORMAL LOW (ref 12–28)
BUN: 7 mg/dL — ABNORMAL LOW (ref 8–27)
Bilirubin Total: 0.4 mg/dL (ref 0.0–1.2)
CO2: 22 mmol/L (ref 20–29)
Calcium: 9 mg/dL (ref 8.7–10.3)
Chloride: 100 mmol/L (ref 96–106)
Creatinine, Ser: 0.88 mg/dL (ref 0.57–1.00)
Globulin, Total: 2.7 g/dL (ref 1.5–4.5)
Glucose: 251 mg/dL — ABNORMAL HIGH (ref 70–99)
Potassium: 4.4 mmol/L (ref 3.5–5.2)
Sodium: 136 mmol/L (ref 134–144)
Total Protein: 6.8 g/dL (ref 6.0–8.5)
eGFR: 75 mL/min/{1.73_m2} (ref >59)

## 2022-02-04 LAB — CBC WITH DIFFERENTIAL/PLATELET
Basophils Absolute: 0.1 10*3/uL (ref 0.0–0.2)
Basos: 1 %
EOS (ABSOLUTE): 0.3 10*3/uL (ref 0.0–0.4)
Eos: 4 %
Hematocrit: 42.6 % (ref 34.0–46.6)
Hemoglobin: 14.4 g/dL (ref 11.1–15.9)
Immature Grans (Abs): 0 10*3/uL (ref 0.0–0.1)
Immature Granulocytes: 0 %
Lymphocytes Absolute: 2.4 10*3/uL (ref 0.7–3.1)
Lymphs: 40 %
MCH: 30.1 pg (ref 26.6–33.0)
MCHC: 33.8 g/dL (ref 31.5–35.7)
MCV: 89 fL (ref 79–97)
Monocytes Absolute: 0.4 10*3/uL (ref 0.1–0.9)
Monocytes: 7 %
Neutrophils Absolute: 2.8 10*3/uL (ref 1.4–7.0)
Neutrophils: 48 %
Platelets: 175 10*3/uL (ref 150–450)
RBC: 4.78 x10E6/uL (ref 3.77–5.28)
RDW: 12.4 % (ref 11.7–15.4)
WBC: 6 10*3/uL (ref 3.4–10.8)

## 2022-02-04 LAB — HEMOGLOBIN A1C
Est. average glucose Bld gHb Est-mCnc: 243 mg/dL
Hgb A1c MFr Bld: 10.1 % — ABNORMAL HIGH (ref 4.8–5.6)

## 2022-02-04 LAB — B12 AND FOLATE PANEL
Folate: 8.5 ng/mL (ref >3.0)
Vitamin B-12: 1394 pg/mL — ABNORMAL HIGH (ref 232–1245)

## 2022-02-04 LAB — T4, FREE: Free T4: 1.13 ng/dL (ref 0.82–1.77)

## 2022-02-04 LAB — VITAMIN D 25 HYDROXY (VIT D DEFICIENCY, FRACTURES): Vit D, 25-Hydroxy: 22.2 ng/mL — ABNORMAL LOW (ref 30.0–100.0)

## 2022-02-04 LAB — TSH: TSH: 4.71 u[IU]/mL — ABNORMAL HIGH (ref 0.450–4.500)

## 2022-02-14 NOTE — Assessment & Plan Note (Signed)
No alarm sx at this time. We will monitor labs today. I have encouraged her to continue with her diet and exercise regimen to help with optimal control.

## 2022-02-14 NOTE — Assessment & Plan Note (Signed)
Chronic. We will monitor labs today given that her increased stress levels could contribute to poor control. Will plan to make changes to her plan of care as needed based on lab findings.

## 2022-02-14 NOTE — Assessment & Plan Note (Signed)
>>  ASSESSMENT AND PLAN FOR TYPE 2 DIABETES MELLITUS WITH COMPLICATIONS (HCC) WRITTEN ON 02/14/2022  1:01 PM BY Shneur Whittenburg E, NP  Chronic. We will monitor labs today given that her increased stress levels could contribute to poor control. Will plan to make changes to her plan of care as needed based on lab findings.

## 2022-02-14 NOTE — Assessment & Plan Note (Signed)
Chronic. Labs pending today. She was previously not taking this medication, but I have encouraged her to restart this in an effort to be protect herself against cardiovascular damage. Refills provided today.

## 2022-02-14 NOTE — Assessment & Plan Note (Signed)
Will monitor labs today.  

## 2022-02-14 NOTE — Assessment & Plan Note (Signed)
Recommend diet and exercise and continuation of current medication for control of chronic medical conditions. We discussed exercise activities that may be helpful for both weight and mental health management.

## 2022-02-14 NOTE — Assessment & Plan Note (Signed)
Chronic. BP stable today. We will get labs while she is here. No alarm sx present at this time. Continue current medication and at home monitoring along with diet and exercise.

## 2022-02-14 NOTE — Assessment & Plan Note (Signed)
Chronic. Refills provided today. Good control with current management.

## 2022-02-14 NOTE — Assessment & Plan Note (Signed)
Chronic. Will monitor labs today to ensure that levels are appropriate given her recent increase in anxiety levels. Continue current treatment.

## 2022-02-14 NOTE — Assessment & Plan Note (Signed)
Exacerbation of anxiety related to health concerns in both herself and her husband. Unfortunately, this has been a very difficult year, health wise, and she seems to have internalized much of this. She is taking her medication as prescribed and is mindful of taking xanax only when needed to help reduce risks. I will provide refills today. PDMP reviewed with no alarms. Recommend therapeutic techniques to help with daily management of stress and anxiety. Discussed importance of focusing on what she has control over and what changes she can make as opposed to changes she would like to see in others. I will follow closely and she will reach out if she has any new or worsening symptoms. No alarm sx are present at this time.

## 2022-02-15 ENCOUNTER — Encounter: Payer: Self-pay | Admitting: Nurse Practitioner

## 2022-02-17 ENCOUNTER — Encounter: Payer: Self-pay | Admitting: Nurse Practitioner

## 2022-02-17 ENCOUNTER — Other Ambulatory Visit (HOSPITAL_BASED_OUTPATIENT_CLINIC_OR_DEPARTMENT_OTHER): Payer: Self-pay

## 2022-02-17 ENCOUNTER — Ambulatory Visit (INDEPENDENT_AMBULATORY_CARE_PROVIDER_SITE_OTHER): Payer: Managed Care, Other (non HMO) | Admitting: Nurse Practitioner

## 2022-02-17 VITALS — BP 128/82 | HR 77 | Temp 98.3°F | Wt 212.0 lb

## 2022-02-17 DIAGNOSIS — E890 Postprocedural hypothyroidism: Secondary | ICD-10-CM

## 2022-02-17 DIAGNOSIS — I739 Peripheral vascular disease, unspecified: Secondary | ICD-10-CM

## 2022-02-17 DIAGNOSIS — J324 Chronic pansinusitis: Secondary | ICD-10-CM | POA: Diagnosis not present

## 2022-02-17 DIAGNOSIS — E1159 Type 2 diabetes mellitus with other circulatory complications: Secondary | ICD-10-CM

## 2022-02-17 DIAGNOSIS — E559 Vitamin D deficiency, unspecified: Secondary | ICD-10-CM | POA: Diagnosis not present

## 2022-02-17 DIAGNOSIS — E161 Other hypoglycemia: Secondary | ICD-10-CM

## 2022-02-17 DIAGNOSIS — I10 Essential (primary) hypertension: Secondary | ICD-10-CM

## 2022-02-17 MED ORDER — TIRZEPATIDE 5 MG/0.5ML ~~LOC~~ SOAJ
5.0000 mg | SUBCUTANEOUS | 1 refills | Status: DC
  Filled 2022-02-17: qty 6, 84d supply, fill #0
  Filled 2022-03-23: qty 2, 28d supply, fill #0
  Filled 2022-04-22: qty 2, 28d supply, fill #1
  Filled 2022-06-08: qty 2, 28d supply, fill #2

## 2022-02-17 MED ORDER — DOXYCYCLINE HYCLATE 100 MG PO TABS
100.0000 mg | ORAL_TABLET | Freq: Two times a day (BID) | ORAL | 2 refills | Status: DC
  Filled 2022-02-17: qty 20, 10d supply, fill #0
  Filled 2022-02-21: qty 20, 10d supply, fill #1
  Filled 2022-03-18 – 2022-03-19 (×2): qty 20, 10d supply, fill #2

## 2022-02-17 MED ORDER — TIRZEPATIDE 2.5 MG/0.5ML ~~LOC~~ SOAJ
2.5000 mg | SUBCUTANEOUS | 0 refills | Status: DC
  Filled 2022-02-17: qty 2, 28d supply, fill #0

## 2022-02-17 MED ORDER — FREESTYLE LIBRE 3 SENSOR MISC
11 refills | Status: DC
  Filled 2022-02-17: qty 2, 28d supply, fill #0
  Filled 2022-03-18 – 2022-03-19 (×2): qty 2, 28d supply, fill #1
  Filled 2022-04-15: qty 2, 28d supply, fill #2
  Filled 2022-05-06: qty 2, 30d supply, fill #3
  Filled 2022-06-08: qty 2, 30d supply, fill #4

## 2022-02-17 MED ORDER — VITAMIN D (ERGOCALCIFEROL) 1.25 MG (50000 UNIT) PO CAPS
50000.0000 [IU] | ORAL_CAPSULE | ORAL | 3 refills | Status: DC
  Filled 2022-02-17: qty 4, 28d supply, fill #0
  Filled 2022-03-18 – 2022-03-19 (×2): qty 4, 28d supply, fill #1
  Filled 2022-04-09 – 2022-04-19 (×3): qty 4, 28d supply, fill #2
  Filled 2022-05-14 (×2): qty 4, 28d supply, fill #3
  Filled 2022-06-11 (×2): qty 4, 28d supply, fill #4
  Filled 2022-07-09 – 2022-07-29 (×2): qty 4, 28d supply, fill #5
  Filled 2022-08-20: qty 4, 28d supply, fill #6
  Filled 2022-09-17 (×2): qty 4, 28d supply, fill #7
  Filled 2022-10-07: qty 4, 28d supply, fill #8
  Filled 2022-11-10: qty 4, 28d supply, fill #9
  Filled 2022-12-06: qty 4, 28d supply, fill #10
  Filled 2023-01-03: qty 4, 28d supply, fill #11

## 2022-02-17 MED ORDER — LEVOTHYROXINE SODIUM 112 MCG PO TABS
112.0000 ug | ORAL_TABLET | Freq: Every day | ORAL | 3 refills | Status: DC
  Filled 2022-02-17: qty 30, 30d supply, fill #0
  Filled 2022-03-18 – 2022-03-19 (×2): qty 30, 30d supply, fill #1
  Filled 2022-05-06: qty 30, 30d supply, fill #2
  Filled 2022-06-22: qty 30, 30d supply, fill #3
  Filled 2022-07-30 (×2): qty 30, 30d supply, fill #4
  Filled 2022-08-31: qty 30, 30d supply, fill #5
  Filled 2022-10-07: qty 30, 30d supply, fill #6
  Filled 2022-11-27 – 2022-12-09 (×2): qty 30, 30d supply, fill #7
  Filled 2023-01-24: qty 30, 30d supply, fill #8

## 2022-02-17 NOTE — Progress Notes (Signed)
Worthy Keeler, DNP, AGNP-c [facility]  [address] [place], [place] [address] [phone]  ESTABLISHED PATIENT- Chronic Health and/or Follow-Up Visit  Blood pressure 128/82, pulse 77, temperature 98.3 F (36.8 C), weight 212 lb (96.2 kg).    [name] is a 62 y.o. year old female presenting today for evaluation and management of the following: A1c elevated [name] tells me she has been struggling with medications for her diabetes. She has been on several different medications, but has not had much success. She has been unable to tolerate metformin due to significant GI distress. Trulicity was not helpful in lowering blood sugars. She is interested in other options that may be helpful for improved control. She does have a history of hypoglycemic reactions and has had success with the free style libre monitor. In the past she has used the 3 and this was great for helping her keep track of her daily glucose, diet, and hypoglycemic episodes. She would like to start this again today.   Thyroid Elevated [name] has lost 20lbs and she believes this has caused her thyroid medication to not work appropriately. She is not having symptoms at this time.    All ROS negative with exception of what is listed above.   PHYSICAL EXAM Physical Exam Vitals and nursing note reviewed.  Constitutional:      Appearance: Normal appearance. She is obese. She is not ill-appearing.  HENT:     Head: Normocephalic.  Eyes:     Pupils: Pupils are equal, round, and reactive to light.  Cardiovascular:     Rate and Rhythm: Normal rate and regular rhythm.     Pulses: Normal pulses.     Heart sounds: Normal heart sounds.  Pulmonary:     Effort: Pulmonary effort is normal.     Breath sounds: Normal breath sounds.  Abdominal:     General: Bowel sounds are normal.     Palpations: Abdomen is soft.  Skin:    General: Skin is warm and dry.  Neurological:     Mental Status: She is alert.   Psychiatric:        Mood and Affect: Mood normal.        Behavior: Behavior normal.        Thought Content: Thought content normal.     PLAN Problem List Items Addressed This Visit     Hypothyroidism    Chronic. TSH is elevated on recent labs. Plan to increase dose to 162mcg daily to see if this helps with improved control. We will plan to recheck the labs in 2 months to see how she is doing.       Relevant Medications   levothyroxine (SYNTHROID) 112 MCG tablet   Essential hypertension    Chronic. BP goals <130/80, she is almost at goal today. No alarm symptoms. We will continue to monitor. I am hopeful with improved glucose control and exercise we will be able to keep her at or below goal without medication changes.       Peripheral vascular disease ([place])    No edema or discoloration to LE noted today. Recommend tight DM control and close monitoring.       Type 2 diabetes mellitus with vascular disease ([place]) - Primary    Chronic. BG not well controlled at this time. Counseling on diet and exercise provided. Unfortunately she has not been able to tolerate metformin or trulicity. I do feel that she would greatly benefit from [facility] as this has two medications  that may be helpful in improving her control. No risks noted at this time. Discussion on medication and she is in agreement. Given her hx of hypoglycemic episodes, I do feel most comfortable with her wearing  a CGM. In the past this has also helped with accountability and improved control. We will send the script for this today. Add mounjaro- if not approved, we will see about Ozempic.       Relevant Medications   tirzepatide (MOUNJARO) 2.5 MG/0.5ML Pen   tirzepatide (MOUNJARO) 5 MG/0.5ML Pen   Continuous Blood Gluc Sensor (FREESTYLE LIBRE 3 SENSOR) MISC   Vitamin D deficiency    Chronic deficiency. Recommend replacement with HD vitamin D.       Relevant Medications   Vitamin D, Ergocalciferol, (DRISDOL) 1.25 MG (50000  UNIT) CAPS capsule   Other Visit Diagnoses     Chronic pansinusitis       Relevant Medications   doxycycline (VIBRA-TABS) 100 MG tablet   Hypoglycemic reaction       Relevant Medications   Continuous Blood Gluc Sensor (FREESTYLE LIBRE 3 SENSOR) MISC       Return in about 8 weeks (around 04/14/2022) for labs only for thyroid.   Worthy Keeler, DNP, AGNP-c 02/17/2022 10:44 AM

## 2022-02-17 NOTE — Patient Instructions (Addendum)
I have sent in the changes to your medication. I want you to increase your dose to levothyroxine to 162mcg daily. We will recheck this in 2 months.   I have sent in for [facility] to see if we could get this covered. This would be my preference for you for control.   I have also sent the Freestyle. We will work to get this approved.     I have sent in doxycycline in the event your sinuses end up infected again.

## 2022-02-18 ENCOUNTER — Encounter: Payer: Self-pay | Admitting: Nurse Practitioner

## 2022-02-18 NOTE — Assessment & Plan Note (Signed)
Chronic deficiency. Recommend replacement with HD vitamin D.

## 2022-02-18 NOTE — Assessment & Plan Note (Signed)
Chronic. BP goals <130/80, she is almost at goal today. No alarm symptoms. We will continue to monitor. I am hopeful with improved glucose control and exercise we will be able to keep her at or below goal without medication changes.

## 2022-02-18 NOTE — Assessment & Plan Note (Signed)
Chronic. BG not well controlled at this time. Counseling on diet and exercise provided. Unfortunately she has not been able to tolerate metformin or trulicity. I do feel that she would greatly benefit from [facility] as this has two medications that may be helpful in improving her control. No risks noted at this time. Discussion on medication and she is in agreement. Given her hx of hypoglycemic episodes, I do feel most comfortable with her wearing  a CGM. In the past this has also helped with accountability and improved control. We will send the script for this today. Add mounjaro- if not approved, we will see about Ozempic.

## 2022-02-18 NOTE — Assessment & Plan Note (Signed)
No edema or discoloration to LE noted today. Recommend tight DM control and close monitoring.

## 2022-02-18 NOTE — Assessment & Plan Note (Signed)
>>  ASSESSMENT AND PLAN FOR TYPE 2 DIABETES MELLITUS WITH COMPLICATIONS (HCC) WRITTEN ON 02/18/2022  7:47 AM BY Flordia Kassem E, NP  Chronic. BG not well controlled at this time. Counseling on diet and exercise provided. Unfortunately she has not been able to tolerate metformin  or trulicity . I do feel that she would greatly benefit from Mounjaro  as this has two medications that may be helpful in improving her control. No risks noted at this time. Discussion on medication and she is in agreement. Given her hx of hypoglycemic episodes, I do feel most comfortable with her wearing  a CGM. In the past this has also helped with accountability and improved control. We will send the script for this today. Add mounjaro - if not approved, we will see about Ozempic.

## 2022-02-18 NOTE — Assessment & Plan Note (Signed)
Chronic. TSH is elevated on recent labs. Plan to increase dose to 121mcg daily to see if this helps with improved control. We will plan to recheck the labs in 2 months to see how she is doing.

## 2022-02-21 ENCOUNTER — Ambulatory Visit: Payer: Self-pay

## 2022-02-21 ENCOUNTER — Other Ambulatory Visit (HOSPITAL_BASED_OUTPATIENT_CLINIC_OR_DEPARTMENT_OTHER): Payer: Self-pay

## 2022-02-21 ENCOUNTER — Ambulatory Visit (INDEPENDENT_AMBULATORY_CARE_PROVIDER_SITE_OTHER): Payer: 59 | Admitting: Family Medicine

## 2022-02-21 VITALS — BP 184/106 | HR 89 | Ht 64.0 in | Wt 213.0 lb

## 2022-02-21 DIAGNOSIS — M25561 Pain in right knee: Secondary | ICD-10-CM | POA: Diagnosis not present

## 2022-02-21 DIAGNOSIS — G8929 Other chronic pain: Secondary | ICD-10-CM | POA: Diagnosis not present

## 2022-02-21 MED ORDER — COMIRNATY 30 MCG/0.3ML IM SUSY
PREFILLED_SYRINGE | INTRAMUSCULAR | 0 refills | Status: DC
  Filled 2022-02-21: qty 0.3, 1d supply, fill #0

## 2022-02-21 NOTE — Patient Instructions (Signed)
Thank you for coming in today.   Please get an Xray today before you leave   I've referred you to Physical Therapy.  Let us know if you don't hear from them in one week.   Please use Voltaren gel (Generic Diclofenac Gel) up to 4x daily for pain as needed.  This is available over-the-counter as both the name brand Voltaren gel and the generic diclofenac gel.   Recheck in 4-6 weeks.   Lots to do if this does not work.

## 2022-02-21 NOTE — Progress Notes (Signed)
   I, Peterson Lombard, LAT, ATC acting as a scribe for Lynne Leader, MD.  Subjective:    CC: Right knee pain  HPI: Patient is a 62 year old female presenting with right knee pain ongoing intermittently for a few years.  Patient locates pain to varying locations around the anterior aspects and along the tibial tuberosity.  R Knee swelling: no Mechanical symptoms: no Aggravates: kneeling, stairs, transitioning to stand Treatments tried: prior steroid injection (@ [facility]), Celebrex, motrin, Icy Hot  Dx imaging: 05/25/2020 R knee XR  Pertinent review of Systems: No fevers or chills  Relevant historical information: Diabetes   Objective:    Vitals:   02/21/22 1501  BP: (!) 184/106  Pulse: 89  SpO2: 97%   General: Well Developed, well nourished, and in no acute distress.   MSK: Right knee: Swelling at the tibial tuberosity and decreased quad bulk.  Otherwise normal-appearing Normal knee motion. Tender palpation at tibial tuberosity and patellar tendon. Intact strength some pain with resisted knee extension. Stable ligamentous exam. Negative Murray's testing.  Lab and Radiology Results  Diagnostic Limited MSK Ultrasound of: Right knee Quad tendon intact normal-appearing no significant joint effusion. Patellar tendon is intact.  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 Lateral joint line relatively normal-appearing Medial joint line mild degeneration.   Posterior knee no Baker's cyst. Impression: Calcific patellar tendinitis  X-ray images right knee ordered today but not obtained.  Impression and Recommendations:    Assessment and Plan: 62 y.o. female with right knee pain.  Pain thought to be due predominantly to the patellar tendinitis.  She does have some degenerative changes seen on previous x-rays.  The most recent x-ray I have access to is from 2022 which does show a little bit  degenerative change.  She is a good candidate for physical therapy.  Plan to refer to PT and recommend Voltaren gel.  Check back in about 6 weeks.  If not better consider injection or even shockwave therapy.  PDMP not reviewed this encounter. Orders Placed This Encounter  Procedures   [place] LIMITED JOINT SPACE STRUCTURES LOW RIGHT(NO LINKED CHARGES)    Order Specific Question:   Reason for Exam (SYMPTOM  OR DIAGNOSIS REQUIRED)    Answer:   right knee pain    Order Specific Question:   Preferred imaging location?    Answer:   [facility]   DG Knee AP/LAT W/[place] Right    Standing Status:   Future    Standing Expiration Date:   02/22/2023    Order Specific Question:   Reason for Exam (SYMPTOM  OR DIAGNOSIS REQUIRED)    Answer:   eval knee pain r    Order Specific Question:   Preferred imaging location?    Answer:   [facility]   Ambulatory referral to Physical Therapy    Referral Priority:   Routine    Referral Type:   Physical Medicine    Referral Reason:   Specialty Services Required    Requested Specialty:   Physical Therapy    Number of Visits Requested:   1   No orders of the defined types were placed in this encounter.   Discussed warning signs or symptoms. Please see discharge instructions. Patient expresses understanding.   The above documentation has been reviewed and is accurate and complete Lynne Leader, M.D.

## 2022-02-22 ENCOUNTER — Encounter: Payer: Self-pay | Admitting: Nurse Practitioner

## 2022-02-22 ENCOUNTER — Other Ambulatory Visit (HOSPITAL_BASED_OUTPATIENT_CLINIC_OR_DEPARTMENT_OTHER): Payer: Self-pay

## 2022-02-22 ENCOUNTER — Encounter: Payer: Self-pay | Admitting: Family Medicine

## 2022-02-23 ENCOUNTER — Telehealth: Payer: Self-pay | Admitting: Nurse Practitioner

## 2022-02-23 ENCOUNTER — Other Ambulatory Visit (HOSPITAL_BASED_OUTPATIENT_CLINIC_OR_DEPARTMENT_OTHER): Payer: Self-pay

## 2022-02-23 ENCOUNTER — Other Ambulatory Visit: Payer: Self-pay

## 2022-02-23 DIAGNOSIS — G8929 Other chronic pain: Secondary | ICD-10-CM

## 2022-02-23 NOTE — Telephone Encounter (Signed)
P.A. MOUNJARO 

## 2022-02-24 ENCOUNTER — Other Ambulatory Visit (HOSPITAL_BASED_OUTPATIENT_CLINIC_OR_DEPARTMENT_OTHER): Payer: Self-pay

## 2022-02-24 NOTE — Telephone Encounter (Signed)
Halford Chessman approved til 02/23/23, sent mychart message

## 2022-03-05 ENCOUNTER — Other Ambulatory Visit (HOSPITAL_BASED_OUTPATIENT_CLINIC_OR_DEPARTMENT_OTHER): Payer: Self-pay

## 2022-03-07 ENCOUNTER — Other Ambulatory Visit: Payer: Self-pay

## 2022-03-13 ENCOUNTER — Other Ambulatory Visit (HOSPITAL_BASED_OUTPATIENT_CLINIC_OR_DEPARTMENT_OTHER): Payer: Self-pay

## 2022-03-14 ENCOUNTER — Ambulatory Visit: Payer: 59 | Attending: Family Medicine | Admitting: Rehabilitative and Restorative Service Providers"

## 2022-03-14 ENCOUNTER — Encounter: Payer: Self-pay | Admitting: Rehabilitative and Restorative Service Providers"

## 2022-03-14 DIAGNOSIS — M25561 Pain in right knee: Secondary | ICD-10-CM | POA: Diagnosis present

## 2022-03-14 DIAGNOSIS — M6281 Muscle weakness (generalized): Secondary | ICD-10-CM

## 2022-03-14 DIAGNOSIS — G8929 Other chronic pain: Secondary | ICD-10-CM | POA: Diagnosis present

## 2022-03-14 NOTE — Therapy (Signed)
OUTPATIENT PHYSICAL THERAPY LOWER EXTREMITY EVALUATION   Patient Name: [name] MRN: [id number] DOB:[birth year], 62 y.o., female Today's Date: 03/14/2022  END OF SESSION:  PT End of Session - 03/14/22 1404     Visit Number 1    Number of Visits 16    Date for PT Re-Evaluation 05/13/22    PT Start Time 7793    PT Stop Time 1450    PT Time Calculation (min) 45 min    Activity Tolerance Patient tolerated treatment well    Behavior During Therapy [facility] for tasks assessed/performed             Past Medical History:  Diagnosis Date   Abdominal bloating 11/05/2020   Achilles tendon contracture, right    Acute non-recurrent frontal sinusitis 12/24/2019   ALLERGIC RHINITIS 04/10/2008   Allergic rhinitis 04/10/2008   Qualifier: Diagnosis of  By: Burnice Logan  MD, Doretha Sou    Asthma    asthmatic bronchitis   Breast lump in female    Carpal tunnel syndrome of right wrist    Cellulitis 10/31/2019   Chronic right shoulder pain 05/07/2019   DEPRESSION 08/13/2008   Diabetes mellitus without complication ([place])    Fatigue 11/17/2015   Fatty liver disease, nonalcoholic 10/18/90   Hepatitis    fatty liver   HYPERLIPIDEMIA 08/04/2006   HYPERTENSION 08/04/2006   HYPOTHYROIDISM 08/04/2006   Kidney injury    LOW BACK PAIN 08/04/2006   Moderate persistent asthma with (acute) exacerbation 06/27/2019   Neuromuscular disorder (HCC)    Patellofemoral syndrome, right 06/19/2019   PERIMENOPAUSAL SYNDROME 05/08/2008   Plantar fasciitis of right foot 04/12/2017   Plantar fasciitis, left 11/17/2015   Postmenopausal bleeding 07/17/2019   Biopsy and [place] c/w endometrial polyp.  Pt to see Dr. Rosana Hoes for surgical consult.     Primary insomnia 10/31/2019   PVD (peripheral vascular disease) ([place])    occlusive, status post bifem bypass 2007   SOB (shortness of breath) 07/12/2016   Vaginal candidiasis 10/31/2019   Past Surgical History:  Procedure Laterality Date   CARPAL TUNNEL RELEASE Right 04/05/2016   Procedure:  OPEN RIGHT CARPAL TUNNEL RELEASE;  Surgeon: Jessy Oto, MD;  Location: [place];  Service: Orthopedics;  Laterality: Right;   CHOLECYSTECTOMY     FEMORAL BYPASS     bifem.   HAGLAND'S DEFORMITY EXCISION     PLANTAR FASCIA RELEASE Right 04/18/2018   Procedure: PLANTAR FASCIA RELEASE AND GASTROCNEMIUS RECESSION RIGHT;  Surgeon: Newt Minion, MD;  Location: [place];  Service: Orthopedics;  Laterality: Right;   TUBAL LIGATION     Patient Active Problem List   Diagnosis Date Noted   Chondromalacia of right patella 12/17/2021   Breast lump in female 06/20/2021   Closed head injury 04/21/2021   Osteoarthritis of right acromioclavicular joint 03/09/2021   Pain of finger of right hand 02/09/2021   Herpes zoster 01/25/2021   Subacromial impingement of right shoulder 12/31/2020   Moderate persistent asthma without complication 33/00/7622   Diarrhea 11/05/2020   Reflux gastritis 11/05/2020   Acute non-recurrent pansinusitis 08/04/2020   Rosacea 08/04/2020   Situational anxiety 05/11/2020   Body mass index (BMI) of 36.0-36.9 in adult 05/11/2020   Abnormality of right breast on screening mammogram 10/14/2019   DDD (degenerative disc disease), cervical 63/33/5456   Nonalcoholic steatohepatitis (NASH) 08/08/2019   Migraine with aura and without status migrainosus, not intractable 05/31/2017   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   Type 2 diabetes mellitus  with vascular disease (HCC) 01/14/2015   Vitamin D deficiency 01/14/2015   Peripheral vascular disease (HCC) 07/15/2011   Hypothyroidism 08/04/2006   Dyslipidemia 08/04/2006   Essential hypertension 08/04/2006    PCP: Enid Skeens, NP  REFERRING PROVIDER: Clementeen Graham, MD  REFERRING DIAG: R knee pain  THERAPY DIAG:  Muscle weakness (generalized)  Chronic pain of right knee  Rationale for Evaluation and Treatment: Rehabilitation  ONSET DATE: 02/23/22  SUBJECTIVE:   SUBJECTIVE STATEMENT: The patient  reports R knee pain that comes and goes over time. She notes MD reports some arthritis behind the knee cap. She notes shots do not help with the R knee. She has some patellar pain and tenderness at the tibial tuberosity.  Pain is worse descending stairs (she has to move sideways at times). This can vary day to day.   PERTINENT HISTORY: Diabetes, h/o aorto bifemoral bypass, h/o anxiety, migraine PAIN:  Are you having pain? Yes: NPRS scale: 2-3/10 Pain location: R anterior knee Pain description: achiness and sharp shooting at times  Aggravating factors: stairs, kneeling, transitioning to stand. Relieving factors: celebrex, motrin, icy hot  PRECAUTIONS: None  WEIGHT BEARING RESTRICTIONS: No  FALLS:  Has patient fallen in last 6 months? 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  PLOF: Independent  PATIENT GOALS: "The best outcome I can get."  NEXT MD VISIT:   OBJECTIVE:  PATIENT SURVEYS:  FOTO 59  to 67%  SENSATION: Bilateral Les in feet  MUSCLE LENGTH: Hamstrings: Right -10 deg; Left -10 deg Thomas test: Right WFLs deg; Left WFLs deg Prone quad stretch= pain bilaterally  POSTURE: rounded shoulders and forward head  PALPATION: Tender to palpation bilat tibial tuberosity, R medial knee, R medial aspect of quad, bilat piriformis, lateral hip bilaterally.  LOWER EXTREMITY ROM:  Active ROM Right eval Left eval  Hip flexion WNLs WNLs  Hip extension    Hip abduction    Hip adduction    Hip internal rotation    Hip external rotation    Knee flexion WNLs WNLs  Knee extension -4 -2  Ankle dorsiflexion    Ankle plantarflexion    Ankle inversion    Ankle eversion     (Blank rows = not tested)  LOWER EXTREMITY MMT:  MMT Right eval Left eval  Hip flexion  3+/5 3+/5  Hip extension    Hip abduction    Knee flexion 4/5 4/5  Knee extension 5/5 5/5  Ankle dorsiflexion 5/5 5/5  Ankle plantarflexion 4/5 4/5   (Blank rows = not tested)  LOWER EXTREMITY SPECIAL TESTS:  Knee special tests: Anterior drawer test: negative, Posterior drawer test: negative, and Patellafemoral apprehension test: negative  FUNCTIONAL TESTS:  5 times sit to stand: 14.94 seconds Single leg stance 10 seconds R and L  Single knee (mini) squat-- can do 5 reps with pain  Squat at wall-- can tolerate a mini squat   GAIT: Distance walked: x 100 ft Assistive device utilized: None Level of assistance: Complete Independence  [facility] Adult PT Treatment:  DATE: 03/14/22  Therapeutic Exercise: Bridges supine Seated HS stretch Standing side stepping, quarter wall squat and T single leg lift  PATIENT EDUCATION:  Education details: HEP Person educated: Patient Education method: Consulting civil engineer, Media planner, and Handouts Education comprehension: verbalized understanding, returned demonstration, and needs further education  HOME EXERCISE PROGRAM: Access Code: [email or web address] URL: [email or web address] Date: 03/14/2022 Prepared by: Rudell Cobb  Exercises - Supine Bridge  - 1 x daily - 5 x weekly - 1 sets - 10 reps - Wall Quarter Squat  - 1 x daily - 5 x weekly - 1 sets - 10 reps - Sidestepping  - 1 x daily - 5 x weekly - 1 sets - 10 reps - Forward T with Counter Support  - 1 x daily - 5 x weekly - 1 sets - 10 reps - Seated Hamstring Stretch with Chair  - 1 x daily - 5 x weekly - 1 sets - 2 reps - 30 seconds hold  ASSESSMENT:  CLINICAL IMPRESSION: Patient is a 62 y.o. female who was seen today for physical therapy evaluation and treatment for R knee pain. In addition, she notes low back and left knee pain that is also chronic in nature. She presents with impairments in hip flexion/knee flexion/ankle PF strength, dec'd  flexibility for hamstrings/gastrocs/quads, myofascial limitations in bilat hips/Les, pain with loading bilaterally, and pain during functional tasks.  OBJECTIVE IMPAIRMENTS: Abnormal gait, decreased activity tolerance, decreased balance, decreased ROM, decreased strength, increased fascial restrictions, impaired flexibility, and pain.   ACTIVITY LIMITATIONS: sitting, squatting, and stairs  PARTICIPATION LIMITATIONS:  walking , wellness routine  PERSONAL FACTORS: 3+ comorbidities: h/o low back pain, h/o bypass, diabetes  are also affecting patient's functional outcome.   REHAB POTENTIAL: Good  CLINICAL DECISION MAKING: Stable/uncomplicated  EVALUATION COMPLEXITY: Low  GOALS: Goals reviewed with patient? Yes  SHORT TERM GOALS: Target date: 04/11/2022  Patient will be independent with initial HEP. Baseline: Initiated HEP Goal status: INITIAL  2.  The patient will improve FOTO from 59% up to 65%  Baseline: 59%   Goal status: INITIAL  3.  The patient will tolerate walking x 5 minute intervals without increase in knee pain. Baseline: Limited participation in walking due to knee and back pain. Goal status: INITIAL  LONG TERM GOALS: Target date: 05/09/2022   Patient will be independent with advanced/ongoing HEP to improve outcomes and carryover.  Baseline: Initiated HEP today. Goal status: INITIAL  2.  Patient will improve FOTO from 59% to > or equal to 67% to demo improved functional mobility.  Baseline: 59% Goal status: INITIAL  3. Patient will demonstrate improved functional LE strength as demonstrated by being able to squat to reach the floor and return to standing.. Baseline: pain with wall slide  Goal status: INITIAL  4. Patient will be able to ascend/descend stairs with 1 HR and reciprocal step pattern safely to access home and community.  Baseline: at times has to go sideways or step to pattern Goal status: INITIAL  PLAN:  PT FREQUENCY: 2x/week  PT DURATION: 8  weeks *is going to schedule 1x/week due to copay  PLANNED INTERVENTIONS: Therapeutic exercises, Therapeutic activity, Neuromuscular re-education, Balance training, Gait training, Patient/Family education, Self Care, Joint mobilization, Stair training, Aquatic Therapy, Dry Needling, Electrical stimulation, Cryotherapy, Moist heat, Taping, Ionotophoresis 4mg /ml Dexamethasone, and Manual therapy  PLAN FOR NEXT SESSION: check HEP, LE strengthening, stair training (begin with step ups/downs to low surface), lateral hip strengthening, quad/HS stretching.    Raymond, PT 03/14/2022, 2:04 PM

## 2022-03-18 ENCOUNTER — Other Ambulatory Visit: Payer: Self-pay | Admitting: Nurse Practitioner

## 2022-03-18 ENCOUNTER — Other Ambulatory Visit (HOSPITAL_BASED_OUTPATIENT_CLINIC_OR_DEPARTMENT_OTHER): Payer: Self-pay

## 2022-03-18 DIAGNOSIS — J0101 Acute recurrent maxillary sinusitis: Secondary | ICD-10-CM

## 2022-03-18 MED ORDER — DESLORATADINE 5 MG PO TABS
5.0000 mg | ORAL_TABLET | Freq: Every day | ORAL | 5 refills | Status: DC
  Filled 2022-03-18: qty 30, 30d supply, fill #0
  Filled 2022-04-11 – 2022-04-19 (×2): qty 30, 30d supply, fill #1
  Filled 2022-05-16 – 2022-05-17 (×2): qty 30, 30d supply, fill #2
  Filled 2022-06-15: qty 30, 30d supply, fill #3
  Filled 2022-07-15 – 2022-07-29 (×2): qty 30, 30d supply, fill #4
  Filled 2022-08-22: qty 30, 30d supply, fill #5

## 2022-03-19 ENCOUNTER — Other Ambulatory Visit (HOSPITAL_BASED_OUTPATIENT_CLINIC_OR_DEPARTMENT_OTHER): Payer: Self-pay

## 2022-03-21 ENCOUNTER — Ambulatory Visit (INDEPENDENT_AMBULATORY_CARE_PROVIDER_SITE_OTHER): Payer: 59 | Admitting: Family Medicine

## 2022-03-21 ENCOUNTER — Ambulatory Visit: Payer: 59 | Attending: Family Medicine | Admitting: Rehabilitative and Restorative Service Providers"

## 2022-03-21 VITALS — BP 136/84 | HR 73 | Ht 64.0 in | Wt 211.0 lb

## 2022-03-21 DIAGNOSIS — M25561 Pain in right knee: Secondary | ICD-10-CM | POA: Diagnosis present

## 2022-03-21 DIAGNOSIS — G8929 Other chronic pain: Secondary | ICD-10-CM | POA: Insufficient documentation

## 2022-03-21 DIAGNOSIS — M6281 Muscle weakness (generalized): Secondary | ICD-10-CM | POA: Insufficient documentation

## 2022-03-21 NOTE — Therapy (Addendum)
OUTPATIENT PHYSICAL THERAPY LOWER EXTREMITY TREATMENT and DISCHARGE SUMMARY   Patient Name: [name] MRN: [id number] DOB:[birth year], 62 y.o., female Today's Date: 03/21/2022   PHYSICAL THERAPY DISCHARGE SUMMARY  Visits from Start of Care: 2  Current functional level related to goals / functional outcomes: Pt did not return after last session. She was tolerating HEP well -see note below for last known status.   Remaining deficits: unsure   Education / Equipment: HEP   Patient agrees to discharge. Patient goals were not met. Patient is being discharged due to not returning since the last visit.  END OF SESSION:  PT End of Session - 03/21/22 1108     Visit Number 2    Number of Visits 16    Date for PT Re-Evaluation 05/13/22    PT Start Time 1109    PT Stop Time 4132    PT Time Calculation (min) 39 min    Activity Tolerance Patient tolerated treatment well    Behavior During Therapy [facility] for tasks assessed/performed             Past Medical History:  Diagnosis Date   Abdominal bloating 11/05/2020   Achilles tendon contracture, right    Acute non-recurrent frontal sinusitis 12/24/2019   ALLERGIC RHINITIS 04/10/2008   Allergic rhinitis 04/10/2008   Qualifier: Diagnosis of  By: Burnice Logan  MD, Doretha Sou    Asthma    asthmatic bronchitis   Breast lump in female    Carpal tunnel syndrome of right wrist    Cellulitis 10/31/2019   Chronic right shoulder pain 05/07/2019   DEPRESSION 08/13/2008   Diabetes mellitus without complication ([place])    Fatigue 11/17/2015   Fatty liver disease, nonalcoholic 4/40/1027   Hepatitis    fatty liver   HYPERLIPIDEMIA 08/04/2006   HYPERTENSION 08/04/2006   HYPOTHYROIDISM 08/04/2006   Kidney injury    LOW BACK PAIN 08/04/2006   Moderate persistent asthma with (acute) exacerbation 06/27/2019   Neuromuscular disorder (HCC)    Patellofemoral syndrome, right 06/19/2019   PERIMENOPAUSAL SYNDROME 05/08/2008   Plantar fasciitis of right foot  04/12/2017   Plantar fasciitis, left 11/17/2015   Postmenopausal bleeding 07/17/2019   Biopsy and [place] c/w endometrial polyp.  Pt to see Dr. Rosana Hoes for surgical consult.     Primary insomnia 10/31/2019   PVD (peripheral vascular disease) ([place])    occlusive, status post bifem bypass 2007   SOB (shortness of breath) 07/12/2016   Vaginal candidiasis 10/31/2019   Past Surgical History:  Procedure Laterality Date   CARPAL TUNNEL RELEASE Right 04/05/2016   Procedure: OPEN RIGHT CARPAL TUNNEL RELEASE;  Surgeon: Jessy Oto, MD;  Location: [place];  Service: Orthopedics;  Laterality: Right;   CHOLECYSTECTOMY     FEMORAL BYPASS     bifem.   HAGLAND'S DEFORMITY EXCISION     PLANTAR FASCIA RELEASE Right 04/18/2018   Procedure: PLANTAR FASCIA RELEASE AND GASTROCNEMIUS RECESSION RIGHT;  Surgeon: Newt Minion, MD;  Location: [place];  Service: Orthopedics;  Laterality: Right;   TUBAL LIGATION     Patient Active Problem List   Diagnosis Date Noted   Chondromalacia of right patella 12/17/2021   Breast lump in female 06/20/2021   Closed head injury 04/21/2021   Osteoarthritis of right acromioclavicular joint 03/09/2021   Pain of finger of right hand 02/09/2021   Herpes zoster 01/25/2021   Subacromial impingement of right shoulder 12/31/2020   Moderate persistent asthma without complication 25/36/6440   Diarrhea 11/05/2020  Reflux gastritis 11/05/2020   Acute non-recurrent pansinusitis 08/04/2020   Rosacea 08/04/2020   Situational anxiety 05/11/2020   Body mass index (BMI) of 36.0-36.9 in adult 05/11/2020   Abnormality of right breast on screening mammogram 10/14/2019   DDD (degenerative disc disease), cervical 08/65/7846   Nonalcoholic steatohepatitis (NASH) 08/08/2019   Migraine with aura and without status migrainosus, not intractable 05/31/2017   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   Type 2 diabetes mellitus with vascular disease ([place]) 01/14/2015   Vitamin D deficiency  01/14/2015   Peripheral vascular disease ([place]) 07/15/2011   Hypothyroidism 08/04/2006   Dyslipidemia 08/04/2006   Essential hypertension 08/04/2006    PCP: Jacolyn Reedy, NP  REFERRING PROVIDER: Lynne Leader, MD  REFERRING DIAG: R knee pain  THERAPY DIAG:  Muscle weakness (generalized)  Chronic pain of right knee  Rationale for Evaluation and Treatment: Rehabilitation  ONSET DATE: 02/23/22  SUBJECTIVE:   SUBJECTIVE STATEMENT: The patient is tolerating the exercises well. She is not noting increased pain except in her quad described as "muscle soreness". She notes she put out 10 bags of mulch yesterday.  PERTINENT HISTORY: Diabetes, h/o aorto bifemoral bypass, h/o anxiety, migraine. Reported subjective from eval: The patient reports R knee pain that comes and goes over time. She notes MD reports some arthritis behind the knee cap. She notes shots do not help with the R knee. She has some patellar pain and tenderness at the tibial tuberosity.  Pain is worse descending stairs (she has to move sideways at times). This can vary day to day.  PAIN:  Are you having pain? Yes: NPRS scale: 0/10 Pain location: R anterior knee Pain description: n/a Aggravating factors: n/a. Relieving factors: celebrex, motrin, icy hot  PRECAUTIONS: None  WEIGHT BEARING RESTRICTIONS: No  FALLS:  Has patient fallen in last 6 months? No  PATIENT GOALS: "The best outcome I can get."  OBJECTIVE:  (Measures in this section from initial evaluation unless otherwise noted) LOWER EXTREMITY ROM:  Active ROM Right eval Left eval  Hip flexion WNLs WNLs  Hip extension    Knee flexion WNLs WNLs  Knee extension -4 -2  Ankle dorsiflexion    Ankle plantarflexion     (Blank rows = not tested)  LOWER EXTREMITY MMT:  MMT Right eval Left eval  Hip flexion 3+/5 3+/5  Hip extension    Hip abduction    Knee flexion 4/5 4/5  Knee extension 5/5 5/5  Ankle dorsiflexion 5/5 5/5  Ankle plantarflexion 4/5 4/5    (Blank rows = not tested)  FUNCTIONAL TESTS:  5 times sit to stand: 14.94 seconds Single leg stance 10 seconds R and L  Single knee (mini) squat-- can do 5 reps with pain  Squat at wall-- can tolerate a mini squat  GAIT: Distance walked: x 100 ft Assistive device utilized: None Level of assistance: Complete Independence  [facility] Adult PT Treatment:                                                DATE: 03/21/22 Therapeutic Exercise: Nu-step level 5 x 5 minutes with Ues/Les Standing Wall slides x 10 reps Heel raises x 20 Sidestepping with red band x 5 feet x 10 reps 2" step lateral step up Clocks 12 and 3 o'clock with R side and then L side  Heel walking Toe walking Standing modified single  leg dead lift reaching to mat height surface Supine SLR with external rotation x 12 reps Bridges x 10 reps Hip adductor stretch Piriformis stretch R And L Hip PROM with end range pressure due to tightness-- dec'd R hip ER as compared to the L side Prone  quad stretch P/ROM hip IR and ER  [facility] Adult PT Treatment:                                                DATE: 03/14/22  Therapeutic Exercise: Bridges supine Seated HS stretch Standing side stepping, quarter wall squat and T single leg lift  PATIENT EDUCATION:  Education details: HEP Person educated: Patient Education method: Consulting civil engineer, Media planner, and Handouts Education comprehension: verbalized understanding, returned demonstration, and needs further education  HOME EXERCISE PROGRAM: Access Code: [email or web address] URL: [email or web address] Date: 03/21/2022 Prepared by: Rudell Cobb  Exercises - Supine Bridge  - 1 x daily - 5 x weekly - 1 sets - 10 reps - Wall Quarter Squat  - 1 x daily - 5 x weekly - 1 sets - 10 reps - Side Stepping with Resistance at Ankles  - 2 x daily - 7 x weekly - 1 sets - 10 reps - Forward T with Counter Support  - 1 x daily - 5 x weekly - 1 sets - 10 reps - Single Leg Balance with Clock  Reach  - 2 x daily - 7 x weekly - 1 sets - 10 reps - Seated Hamstring Stretch with Chair  - 1 x daily - 5 x weekly - 1 sets - 2 reps - 30 seconds hold  ASSESSMENT:  CLINICAL IMPRESSION: Patient is tolerating HEP progression well. PT progressed there ex in clinic today and also added further strengthening to HEP.   OBJECTIVE IMPAIRMENTS: Abnormal gait, decreased activity tolerance, decreased balance, decreased ROM, decreased strength, increased fascial restrictions, impaired flexibility, and pain.   GOALS: Goals reviewed with patient? Yes  SHORT TERM GOALS: Target date: 04/11/2022  Patient will be independent with initial HEP. Baseline: Initiated HEP Goal status: INITIAL  2.  The patient will improve FOTO from 59% up to 65%  Baseline: 59%   Goal status: INITIAL  3.  The patient will tolerate walking x 5 minute intervals without increase in knee pain. Baseline: Limited participation in walking due to knee and back pain. Goal status: INITIAL  LONG TERM GOALS: Target date: 05/09/2022   Patient will be independent with advanced/ongoing HEP to improve outcomes and carryover.  Baseline: Initiated HEP today. Goal status: INITIAL  2.  Patient will improve FOTO from 59% to > or equal to 67% to demo improved functional mobility.  Baseline: 59% Goal status: INITIAL  3. Patient will demonstrate improved functional LE strength as demonstrated by being able to squat to reach the floor and return to standing.. Baseline: pain with wall slide  Goal status: INITIAL  4. Patient will be able to ascend/descend stairs with 1 HR and reciprocal step pattern safely to access home and community.  Baseline: at times has to go sideways or step to pattern Goal status: INITIAL PLAN:  PT FREQUENCY: 2x/week  PT DURATION: 8 weeks *is going to schedule 1x/week due to copay  PLANNED INTERVENTIONS: Therapeutic exercises, Therapeutic activity, Neuromuscular re-education, Balance training, Gait training,  Patient/Family education, Self Care, Joint mobilization, Stair training, Aquatic Therapy, Dry Needling, Electrical stimulation, Cryotherapy,  Moist heat, Taping, Ionotophoresis 4mg /ml Dexamethasone, and Manual therapy  PLAN FOR NEXT SESSION: Progress HEP, single leg strengthening, step ups, stair training (begin with step ups/downs to low surface), lateral hip strengthening, quad/HS stretching.    [place], PT 03/21/2022, 11:08 AM

## 2022-03-21 NOTE — Patient Instructions (Signed)
Thank you for coming in today.   Kneeling stand with handle   Return as needed

## 2022-03-21 NOTE — Progress Notes (Unsigned)
   I, Peterson Lombard, LAT, ATC acting as a scribe for Lynne Leader, MD.  [name] is a 62 y.o. female who presents to [place] [facility] today for 4-wk f/u chronic R knee pain. Pt was last seen by Dr. Georgina Snell on 02/21/22 and was advised to use Voltaren gel and was referred to PT, completing 2 visits. Today, pt reports R knee is feeling pretty good.  Dx imaging: 05/25/2020 R knee XR   Pertinent review of systems: No fevers or chills  Relevant historical information: Diabetes and vascular disease.   Exam:  BP 136/84   Pulse 73   Ht 5\' 4"  (1.626 m)   Wt 211 lb (95.7 kg)   SpO2 97%   BMI 36.22 kg/m  General: Well Developed, well nourished, and in no acute distress.   MSK: Right knee: Normal-appearing normal motion intact strength.      Assessment and Plan: 62 y.o. female with right knee pain thought to be due to patellar tendinitis.  She has improved significantly with physical therapy.  Plan to finish out physical therapy and continue home exercise program.  If needed we could proceed with shockwave in the future.  Check back as needed.   Total encounter time 20 minutes including face-to-face time with the patient and, reviewing past medical record, and charting on the date of service.   Discussed treatment plan and options of follow-up precautions.   Discussed warning signs or symptoms. Please see discharge instructions. Patient expresses understanding.   The above documentation has been reviewed and is accurate and complete Lynne Leader, M.D.

## 2022-03-22 ENCOUNTER — Other Ambulatory Visit (HOSPITAL_BASED_OUTPATIENT_CLINIC_OR_DEPARTMENT_OTHER): Payer: Self-pay

## 2022-03-23 ENCOUNTER — Other Ambulatory Visit (HOSPITAL_BASED_OUTPATIENT_CLINIC_OR_DEPARTMENT_OTHER): Payer: Self-pay

## 2022-03-28 ENCOUNTER — Encounter: Payer: 59 | Admitting: Rehabilitative and Restorative Service Providers"

## 2022-04-07 ENCOUNTER — Other Ambulatory Visit (HOSPITAL_BASED_OUTPATIENT_CLINIC_OR_DEPARTMENT_OTHER): Payer: Self-pay

## 2022-04-09 ENCOUNTER — Other Ambulatory Visit: Payer: Self-pay

## 2022-04-09 ENCOUNTER — Other Ambulatory Visit (HOSPITAL_BASED_OUTPATIENT_CLINIC_OR_DEPARTMENT_OTHER): Payer: Self-pay

## 2022-04-12 ENCOUNTER — Other Ambulatory Visit (HOSPITAL_BASED_OUTPATIENT_CLINIC_OR_DEPARTMENT_OTHER): Payer: Self-pay

## 2022-04-13 ENCOUNTER — Other Ambulatory Visit (HOSPITAL_BASED_OUTPATIENT_CLINIC_OR_DEPARTMENT_OTHER): Payer: Self-pay

## 2022-04-15 ENCOUNTER — Other Ambulatory Visit (HOSPITAL_BASED_OUTPATIENT_CLINIC_OR_DEPARTMENT_OTHER): Payer: Self-pay

## 2022-04-18 ENCOUNTER — Other Ambulatory Visit (HOSPITAL_BASED_OUTPATIENT_CLINIC_OR_DEPARTMENT_OTHER): Payer: Self-pay

## 2022-04-22 ENCOUNTER — Other Ambulatory Visit (HOSPITAL_BASED_OUTPATIENT_CLINIC_OR_DEPARTMENT_OTHER): Payer: Self-pay

## 2022-05-06 ENCOUNTER — Other Ambulatory Visit (HOSPITAL_BASED_OUTPATIENT_CLINIC_OR_DEPARTMENT_OTHER): Payer: Self-pay

## 2022-05-09 ENCOUNTER — Other Ambulatory Visit (HOSPITAL_BASED_OUTPATIENT_CLINIC_OR_DEPARTMENT_OTHER): Payer: Self-pay

## 2022-05-14 ENCOUNTER — Other Ambulatory Visit (HOSPITAL_BASED_OUTPATIENT_CLINIC_OR_DEPARTMENT_OTHER): Payer: Self-pay | Admitting: Nurse Practitioner

## 2022-05-14 ENCOUNTER — Other Ambulatory Visit (HOSPITAL_BASED_OUTPATIENT_CLINIC_OR_DEPARTMENT_OTHER): Payer: Self-pay

## 2022-05-14 DIAGNOSIS — M19011 Primary osteoarthritis, right shoulder: Secondary | ICD-10-CM

## 2022-05-16 ENCOUNTER — Other Ambulatory Visit (HOSPITAL_BASED_OUTPATIENT_CLINIC_OR_DEPARTMENT_OTHER): Payer: Self-pay

## 2022-05-16 MED ORDER — CELECOXIB 200 MG PO CAPS
200.0000 mg | ORAL_CAPSULE | Freq: Every day | ORAL | 1 refills | Status: DC
  Filled 2022-05-16 – 2022-05-18 (×2): qty 90, 90d supply, fill #0
  Filled 2022-08-02: qty 90, 90d supply, fill #1

## 2022-05-17 ENCOUNTER — Other Ambulatory Visit (HOSPITAL_BASED_OUTPATIENT_CLINIC_OR_DEPARTMENT_OTHER): Payer: Self-pay

## 2022-05-17 ENCOUNTER — Other Ambulatory Visit: Payer: Self-pay

## 2022-05-18 ENCOUNTER — Other Ambulatory Visit (HOSPITAL_BASED_OUTPATIENT_CLINIC_OR_DEPARTMENT_OTHER): Payer: Self-pay

## 2022-05-20 ENCOUNTER — Other Ambulatory Visit: Payer: Self-pay

## 2022-05-21 ENCOUNTER — Other Ambulatory Visit (HOSPITAL_BASED_OUTPATIENT_CLINIC_OR_DEPARTMENT_OTHER): Payer: Self-pay

## 2022-06-08 ENCOUNTER — Other Ambulatory Visit (HOSPITAL_BASED_OUTPATIENT_CLINIC_OR_DEPARTMENT_OTHER): Payer: Self-pay

## 2022-06-10 ENCOUNTER — Other Ambulatory Visit (HOSPITAL_BASED_OUTPATIENT_CLINIC_OR_DEPARTMENT_OTHER): Payer: Self-pay

## 2022-06-11 ENCOUNTER — Other Ambulatory Visit (HOSPITAL_BASED_OUTPATIENT_CLINIC_OR_DEPARTMENT_OTHER): Payer: Self-pay

## 2022-06-13 ENCOUNTER — Other Ambulatory Visit (HOSPITAL_BASED_OUTPATIENT_CLINIC_OR_DEPARTMENT_OTHER): Payer: Self-pay

## 2022-06-13 ENCOUNTER — Encounter: Payer: Self-pay | Admitting: Nurse Practitioner

## 2022-06-14 ENCOUNTER — Other Ambulatory Visit (HOSPITAL_BASED_OUTPATIENT_CLINIC_OR_DEPARTMENT_OTHER): Payer: Self-pay

## 2022-06-14 ENCOUNTER — Other Ambulatory Visit: Payer: Self-pay | Admitting: Nurse Practitioner

## 2022-06-14 DIAGNOSIS — E11649 Type 2 diabetes mellitus with hypoglycemia without coma: Secondary | ICD-10-CM

## 2022-06-14 DIAGNOSIS — E1159 Type 2 diabetes mellitus with other circulatory complications: Secondary | ICD-10-CM

## 2022-06-14 MED ORDER — DEXCOM G7 SENSOR MISC
3 refills | Status: DC
Start: 2022-06-14 — End: 2023-05-25
  Filled 2022-06-14: qty 3, 30d supply, fill #0
  Filled 2022-07-20 – 2022-08-02 (×2): qty 9, 90d supply, fill #0
  Filled 2022-12-07: qty 3, 30d supply, fill #1
  Filled 2023-02-28: qty 3, 30d supply, fill #2
  Filled 2023-04-20: qty 1, 10d supply, fill #3
  Filled 2023-04-20: qty 2, 20d supply, fill #3
  Filled 2023-05-15: qty 3, 30d supply, fill #4

## 2022-06-14 MED ORDER — TIRZEPATIDE 7.5 MG/0.5ML ~~LOC~~ SOAJ
7.5000 mg | SUBCUTANEOUS | 1 refills | Status: DC
  Filled 2022-06-14: qty 2, 28d supply, fill #0
  Filled 2022-07-05: qty 2, 28d supply, fill #1
  Filled 2022-08-02 – 2022-08-03 (×2): qty 2, 28d supply, fill #2
  Filled 2022-08-30: qty 2, 28d supply, fill #3

## 2022-06-14 MED ORDER — DEXCOM G7 RECEIVER DEVI
0 refills | Status: AC
Start: 2022-06-14 — End: ?
  Filled 2022-06-14: qty 1, 30d supply, fill #0
  Filled 2022-06-28: qty 1, 84d supply, fill #0
  Filled 2022-08-02: qty 1, 365d supply, fill #0

## 2022-06-15 ENCOUNTER — Other Ambulatory Visit (HOSPITAL_BASED_OUTPATIENT_CLINIC_OR_DEPARTMENT_OTHER): Payer: Self-pay

## 2022-06-21 ENCOUNTER — Other Ambulatory Visit (HOSPITAL_BASED_OUTPATIENT_CLINIC_OR_DEPARTMENT_OTHER): Payer: Self-pay

## 2022-06-22 ENCOUNTER — Other Ambulatory Visit: Payer: Self-pay

## 2022-06-22 ENCOUNTER — Other Ambulatory Visit (HOSPITAL_BASED_OUTPATIENT_CLINIC_OR_DEPARTMENT_OTHER): Payer: Self-pay

## 2022-06-27 ENCOUNTER — Other Ambulatory Visit (HOSPITAL_BASED_OUTPATIENT_CLINIC_OR_DEPARTMENT_OTHER): Payer: Self-pay

## 2022-06-28 ENCOUNTER — Other Ambulatory Visit (HOSPITAL_BASED_OUTPATIENT_CLINIC_OR_DEPARTMENT_OTHER): Payer: Self-pay

## 2022-06-28 ENCOUNTER — Ambulatory Visit: Payer: Managed Care, Other (non HMO) | Admitting: Nurse Practitioner

## 2022-06-28 ENCOUNTER — Encounter: Payer: Self-pay | Admitting: Nurse Practitioner

## 2022-06-28 VITALS — BP 126/78 | HR 78 | Wt 201.2 lb

## 2022-06-28 DIAGNOSIS — J4540 Moderate persistent asthma, uncomplicated: Secondary | ICD-10-CM

## 2022-06-28 DIAGNOSIS — J0141 Acute recurrent pansinusitis: Secondary | ICD-10-CM

## 2022-06-28 DIAGNOSIS — E118 Type 2 diabetes mellitus with unspecified complications: Secondary | ICD-10-CM | POA: Diagnosis not present

## 2022-06-28 DIAGNOSIS — Z6836 Body mass index (BMI) 36.0-36.9, adult: Secondary | ICD-10-CM | POA: Diagnosis not present

## 2022-06-28 MED ORDER — AMOXICILLIN-POT CLAVULANATE 875-125 MG PO TABS
1.0000 | ORAL_TABLET | Freq: Two times a day (BID) | ORAL | 0 refills | Status: DC
Start: 2022-06-28 — End: 2022-07-12
  Filled 2022-06-28: qty 20, 10d supply, fill #0

## 2022-06-28 MED ORDER — AIRSUPRA 90-80 MCG/ACT IN AERO
1.0000 | INHALATION_SPRAY | Freq: Four times a day (QID) | RESPIRATORY_TRACT | 6 refills | Status: DC | PRN
Start: 2022-06-28 — End: 2023-02-28
  Filled 2022-06-28: qty 10.7, 30d supply, fill #0
  Filled 2022-10-12 – 2022-10-13 (×2): qty 10.7, 30d supply, fill #1

## 2022-06-28 MED ORDER — PREDNISONE 20 MG PO TABS
20.0000 mg | ORAL_TABLET | Freq: Every day | ORAL | 2 refills | Status: DC
Start: 2022-06-28 — End: 2022-07-12
  Filled 2022-06-28: qty 5, 5d supply, fill #0
  Filled 2022-06-29 – 2022-07-08 (×3): qty 5, 5d supply, fill #1

## 2022-06-28 NOTE — Assessment & Plan Note (Signed)
Recurrent sinusitis with wheezing present. Currently utilizing Breo and albuterol. At this time, symptoms not well controlled with albuterol alone.  Plan: - Change albuterol to airspura to add ICS to treatment option - Continue Breo - Follow-up if symptoms worsen.

## 2022-06-28 NOTE — Assessment & Plan Note (Signed)
Currently attempting coverage for Dexcom after Freestyle not covered by insurance. She has had great success with management with use of CGM with improved control of blood sugars. She has not had any recent hypotensive episodes, but previous episodes have triggered the need for CGM. I will see if we can resubmit the claim for coverage. Samples provided today of Freestyle for patient until we can get approval.

## 2022-06-28 NOTE — Assessment & Plan Note (Signed)
>>  ASSESSMENT AND PLAN FOR TYPE 2 DIABETES MELLITUS WITH COMPLICATIONS (HCC) WRITTEN ON 06/28/2022 10:15 AM BY Dalonda Simoni E, NP  Currently attempting coverage for Dexcom after Freestyle not covered by insurance. She has had great success with management with use of CGM with improved control of blood sugars. She has not had any recent hypotensive episodes, but previous episodes have triggered the need for CGM. I will see if we can resubmit the claim for coverage. Samples provided today of Freestyle for patient until we can get approval.

## 2022-06-28 NOTE — Patient Instructions (Signed)
Keep taking the Doxycycline, I have added Augmentin to that. Take this for at least 5 days, but you can continue for the total of 10 if you need to.   I have also sent in prednisone for you. You can start this immediately if you need to for the pressure and pain.   If you are not feeling better in the next 5 days, call and let [name] know so he can tell me.

## 2022-06-28 NOTE — Progress Notes (Signed)
Tollie Eth, DNP, AGNP-c [facility] [address] [place], [place] [address] [phone]  Subjective:   [name] is a 62 y.o. female presents to day for evaluation of: Sinusitis She reports sinus pain and pressure on the left side of the face that started two weeks ago. She has been taking Advil cold and sinus. This has not been helping. She is having pressure in her left ear and fluid build-up in the ear. She tells me the pain has been severe and 600mg  ibuprofen has not been helping.   Dexcom Her new insurance will not cover FreeStyle Josephine Igo that she has been using for her blood sugar monitoring for the last several years. It will cover the Dexcom sensor, but requires prior authorization.  Since starting [facility] her Freestyle Josephine Igo shows that she has been in range 99% of the time.   PMH, Medications, and Allergies reviewed and updated in chart as appropriate.   ROS negative except for what is listed in HPI. Objective:  BP 126/78   Pulse 78   Wt 201 lb 3.2 oz (91.3 kg)   BMI 34.54 kg/m  Physical Exam Vitals and nursing note reviewed.  Constitutional:      Appearance: Normal appearance.  HENT:     Right Ear: A middle ear effusion is present.     Left Ear: Tenderness present. A middle ear effusion is present.     Nose: Mucosal edema and congestion present.     Right Turbinates: Swollen.     Left Turbinates: Swollen.     Right Sinus: Maxillary sinus tenderness and frontal sinus tenderness present.     Left Sinus: Maxillary sinus tenderness and frontal sinus tenderness present.     Mouth/Throat:     Mouth: Mucous membranes are moist.     Pharynx: Posterior oropharyngeal erythema present. No oropharyngeal exudate or uvula swelling.     Tonsils: No tonsillar exudate.  Cardiovascular:     Rate and Rhythm: Normal rate and regular rhythm.     Pulses: Normal pulses.     Heart sounds: Normal heart sounds.  Pulmonary:     Breath sounds: Wheezing present. No  rhonchi.  Lymphadenopathy:     Cervical: Cervical adenopathy present.  Skin:    General: Skin is warm and dry.     Capillary Refill: Capillary refill takes less than 2 seconds.  Neurological:     General: No focal deficit present.     Mental Status: She is alert.  Psychiatric:        Mood and Affect: Mood normal.           Assessment & Plan:   Problem List Items Addressed This Visit     Type 2 diabetes mellitus with complications (HCC)    Currently attempting coverage for Dexcom after Freestyle not covered by insurance. She has had great success with management with use of CGM with improved control of blood sugars. She has not had any recent hypotensive episodes, but previous episodes have triggered the need for CGM. I will see if we can resubmit the claim for coverage. Samples provided today of Freestyle for patient until we can get approval.       Acute recurrent pansinusitis - Primary    Symptoms of sinusitis present for 2 weeks. Historically she has been difficult to treat with prolonged illness. She has started Doxycycline at home, but is still having symptoms. We will add augmentin to her regimen today to help with management.  Plan: -  Continue doxycycline for a total of 5 days - Add Augmentin for 5 days - Change albuterol to Airspura - F/U if no improvement or worsening symptoms      Relevant Medications   predniSONE (DELTASONE) 20 MG tablet   amoxicillin-clavulanate (AUGMENTIN) 875-125 MG tablet   Albuterol-Budesonide (AIRSUPRA) 90-80 MCG/ACT AERO   Moderate persistent asthma without complication    Recurrent sinusitis with wheezing present. Currently utilizing Breo and albuterol. At this time, symptoms not well controlled with albuterol alone.  Plan: - Change albuterol to airspura to add ICS to treatment option - Continue Breo - Follow-up if symptoms worsen.      Relevant Medications   predniSONE (DELTASONE) 20 MG tablet   Albuterol-Budesonide (AIRSUPRA) 90-80  MCG/ACT AERO   Body mass index (BMI) of 36.0-36.9 in adult      Tollie Eth, DNP, AGNP-c 06/28/2022  10:17 AM    Time: 32 minutes, >50% spent counseling, care coordination, chart review, and documentation.   History, Medications, Surgery, SDOH, and Family History reviewed and updated as appropriate.

## 2022-06-28 NOTE — Assessment & Plan Note (Addendum)
Symptoms of sinusitis present for 2 weeks. Historically she has been difficult to treat with prolonged illness. She has started Doxycycline at home, but is still having symptoms. We will add augmentin to her regimen today to help with management.  Plan: - Continue doxycycline for a total of 5 days - Add Augmentin for 5 days - Change albuterol to Airspura - F/U if no improvement or worsening symptoms

## 2022-06-29 ENCOUNTER — Other Ambulatory Visit (HOSPITAL_BASED_OUTPATIENT_CLINIC_OR_DEPARTMENT_OTHER): Payer: Self-pay

## 2022-06-30 ENCOUNTER — Other Ambulatory Visit (HOSPITAL_BASED_OUTPATIENT_CLINIC_OR_DEPARTMENT_OTHER): Payer: Self-pay

## 2022-07-04 ENCOUNTER — Other Ambulatory Visit (HOSPITAL_BASED_OUTPATIENT_CLINIC_OR_DEPARTMENT_OTHER): Payer: Self-pay

## 2022-07-05 ENCOUNTER — Other Ambulatory Visit (HOSPITAL_BASED_OUTPATIENT_CLINIC_OR_DEPARTMENT_OTHER): Payer: Self-pay

## 2022-07-06 ENCOUNTER — Other Ambulatory Visit (HOSPITAL_BASED_OUTPATIENT_CLINIC_OR_DEPARTMENT_OTHER): Payer: Self-pay

## 2022-07-08 ENCOUNTER — Other Ambulatory Visit (HOSPITAL_BASED_OUTPATIENT_CLINIC_OR_DEPARTMENT_OTHER): Payer: Self-pay

## 2022-07-08 ENCOUNTER — Other Ambulatory Visit (HOSPITAL_BASED_OUTPATIENT_CLINIC_OR_DEPARTMENT_OTHER): Payer: Self-pay | Admitting: Nurse Practitioner

## 2022-07-08 DIAGNOSIS — J4540 Moderate persistent asthma, uncomplicated: Secondary | ICD-10-CM

## 2022-07-08 MED ORDER — FLUTICASONE FUROATE-VILANTEROL 100-25 MCG/ACT IN AEPB
INHALATION_SPRAY | RESPIRATORY_TRACT | 5 refills | Status: DC
Start: 2022-07-08 — End: 2023-03-24
  Filled 2022-07-08 – 2022-08-10 (×2): qty 60, 30d supply, fill #0
  Filled 2022-10-26: qty 60, 30d supply, fill #1
  Filled 2023-03-23: qty 60, 30d supply, fill #2

## 2022-07-12 ENCOUNTER — Ambulatory Visit: Payer: BC Managed Care – PPO | Admitting: Nurse Practitioner

## 2022-07-12 ENCOUNTER — Other Ambulatory Visit (HOSPITAL_BASED_OUTPATIENT_CLINIC_OR_DEPARTMENT_OTHER): Payer: Self-pay

## 2022-07-12 ENCOUNTER — Encounter: Payer: Self-pay | Admitting: Nurse Practitioner

## 2022-07-12 VITALS — BP 128/82 | HR 72 | Wt 203.4 lb

## 2022-07-12 DIAGNOSIS — H65192 Other acute nonsuppurative otitis media, left ear: Secondary | ICD-10-CM | POA: Diagnosis not present

## 2022-07-12 DIAGNOSIS — J0141 Acute recurrent pansinusitis: Secondary | ICD-10-CM | POA: Diagnosis not present

## 2022-07-12 MED ORDER — LEVOFLOXACIN 500 MG PO TABS
500.0000 mg | ORAL_TABLET | Freq: Every day | ORAL | 0 refills | Status: AC
Start: 2022-07-12 — End: 2022-07-19
  Filled 2022-07-12: qty 7, 7d supply, fill #0

## 2022-07-12 MED ORDER — METRONIDAZOLE 500 MG PO TABS
500.0000 mg | ORAL_TABLET | Freq: Two times a day (BID) | ORAL | 0 refills | Status: DC
Start: 2022-07-12 — End: 2022-08-08
  Filled 2022-07-12: qty 14, 7d supply, fill #0

## 2022-07-12 MED ORDER — PREDNISONE 20 MG PO TABS
40.0000 mg | ORAL_TABLET | Freq: Every day | ORAL | 2 refills | Status: DC
Start: 2022-07-12 — End: 2022-08-08
  Filled 2022-07-12: qty 10, 5d supply, fill #0

## 2022-07-12 NOTE — Progress Notes (Unsigned)
  Tollie Eth, DNP, AGNP-c [facility] Medicine [address] [place], [place] [address] [phone]  Subjective:   [name] is a 62 y.o. female presents to day for evaluation of: Ear Infection Mehan reports struggling with an ear infection for the past two weeks, which has persisted despite her efforts. She mentions that did not take time off of work while she was ill and she is not sure if this has slowed her recovery. She has completed antibiotics and a prednisone burst. She feels that the Prednisone gives her a bit of energy and has been trying to eat healthily, incorporating foods like spinach salad with chickpeas for protein.   PMH, Medications, and Allergies reviewed and updated in chart as appropriate.   ROS negative except for what is listed in HPI. Objective:  BP 128/82   Pulse 72   Wt 203 lb 6.4 oz (92.3 kg)   BMI 34.91 kg/m  Physical Exam Vitals and nursing note reviewed.  Constitutional:      Appearance: Normal appearance.  HENT:     Head: Normocephalic.     Right Ear: A middle ear effusion is present.     Left Ear: A middle ear effusion is present. Tympanic membrane is erythematous and bulging.     Nose: Mucosal edema and congestion present.     Right Turbinates: Swollen.     Left Turbinates: Swollen.     Right Sinus: Maxillary sinus tenderness and frontal sinus tenderness present.     Left Sinus: Maxillary sinus tenderness and frontal sinus tenderness present.  Cardiovascular:     Rate and Rhythm: Normal rate and regular rhythm.     Pulses: Normal pulses.     Heart sounds: Normal heart sounds.  Pulmonary:     Effort: Pulmonary effort is normal.     Breath sounds: Normal breath sounds.  Neurological:     Mental Status: She is alert.           Assessment & Plan:   Problem List Items Addressed This Visit     Acute recurrent pansinusitis    Chronic nasal congestion and sinus pressure is an ongoing issue. We discussed some additional  treatment options to help provide relief.There is also an acute otitis media present with significant inflammation noted on exam today. She has recently completed antibiotics without improvement.  Plan: - Start Levofloxacin and Metronidazole as prescribed - Increase Prednisone to 40 mg if needed for inflammation - Encourage use of ibuprofen for pain management - Resume Afrin use intermittently, with breaks to avoid addiction - Encourage use of saline nasal spray - Try Novosage machine for sinus irrigation - Consider evaluation for deviated septum if symptoms persist      Relevant Medications   levofloxacin (LEVAQUIN) 500 MG tablet   metroNIDAZOLE (FLAGYL) 500 MG tablet   predniSONE (DELTASONE) 20 MG tablet   Other Visit Diagnoses     Acute mucoid otitis media of left ear    -  Primary   Relevant Medications   levofloxacin (LEVAQUIN) 500 MG tablet   metroNIDAZOLE (FLAGYL) 500 MG tablet   predniSONE (DELTASONE) 20 MG tablet         Tollie Eth, DNP, AGNP-c 07/13/2022  8:26 PM    History, Medications, Surgery, SDOH, and Family History reviewed and updated as appropriate.

## 2022-07-13 ENCOUNTER — Other Ambulatory Visit: Payer: Self-pay

## 2022-07-13 ENCOUNTER — Other Ambulatory Visit (HOSPITAL_BASED_OUTPATIENT_CLINIC_OR_DEPARTMENT_OTHER): Payer: Self-pay

## 2022-07-13 NOTE — Assessment & Plan Note (Signed)
Chronic nasal congestion and sinus pressure is an ongoing issue. We discussed some additional treatment options to help provide relief.There is also an acute otitis media present with significant inflammation noted on exam today. She has recently completed antibiotics without improvement.  Plan: - Start Levofloxacin and Metronidazole as prescribed - Increase Prednisone to 40 mg if needed for inflammation - Encourage use of ibuprofen for pain management - Resume Afrin use intermittently, with breaks to avoid addiction - Encourage use of saline nasal spray - Try Novosage machine for sinus irrigation - Consider evaluation for deviated septum if symptoms persist

## 2022-07-19 ENCOUNTER — Other Ambulatory Visit (HOSPITAL_BASED_OUTPATIENT_CLINIC_OR_DEPARTMENT_OTHER): Payer: Self-pay

## 2022-07-20 ENCOUNTER — Other Ambulatory Visit (HOSPITAL_BASED_OUTPATIENT_CLINIC_OR_DEPARTMENT_OTHER): Payer: Self-pay

## 2022-07-26 ENCOUNTER — Encounter: Payer: Self-pay | Admitting: Nurse Practitioner

## 2022-07-28 ENCOUNTER — Other Ambulatory Visit (HOSPITAL_BASED_OUTPATIENT_CLINIC_OR_DEPARTMENT_OTHER): Payer: Self-pay

## 2022-07-28 ENCOUNTER — Other Ambulatory Visit: Payer: Self-pay

## 2022-07-30 ENCOUNTER — Other Ambulatory Visit (HOSPITAL_BASED_OUTPATIENT_CLINIC_OR_DEPARTMENT_OTHER): Payer: Self-pay

## 2022-08-02 ENCOUNTER — Other Ambulatory Visit (HOSPITAL_BASED_OUTPATIENT_CLINIC_OR_DEPARTMENT_OTHER): Payer: Self-pay

## 2022-08-03 ENCOUNTER — Other Ambulatory Visit (HOSPITAL_BASED_OUTPATIENT_CLINIC_OR_DEPARTMENT_OTHER): Payer: Self-pay

## 2022-08-08 ENCOUNTER — Ambulatory Visit: Payer: BC Managed Care – PPO | Admitting: Nurse Practitioner

## 2022-08-08 ENCOUNTER — Other Ambulatory Visit (HOSPITAL_BASED_OUTPATIENT_CLINIC_OR_DEPARTMENT_OTHER): Payer: Self-pay

## 2022-08-08 ENCOUNTER — Encounter: Payer: Self-pay | Admitting: Nurse Practitioner

## 2022-08-08 VITALS — BP 122/80 | HR 71 | Wt 201.2 lb

## 2022-08-08 DIAGNOSIS — I152 Hypertension secondary to endocrine disorders: Secondary | ICD-10-CM | POA: Diagnosis not present

## 2022-08-08 DIAGNOSIS — M545 Low back pain, unspecified: Secondary | ICD-10-CM

## 2022-08-08 DIAGNOSIS — G4700 Insomnia, unspecified: Secondary | ICD-10-CM

## 2022-08-08 DIAGNOSIS — E1169 Type 2 diabetes mellitus with other specified complication: Secondary | ICD-10-CM

## 2022-08-08 DIAGNOSIS — E118 Type 2 diabetes mellitus with unspecified complications: Secondary | ICD-10-CM

## 2022-08-08 DIAGNOSIS — E1159 Type 2 diabetes mellitus with other circulatory complications: Secondary | ICD-10-CM

## 2022-08-08 DIAGNOSIS — E785 Hyperlipidemia, unspecified: Secondary | ICD-10-CM

## 2022-08-08 DIAGNOSIS — M79644 Pain in right finger(s): Secondary | ICD-10-CM

## 2022-08-08 DIAGNOSIS — E890 Postprocedural hypothyroidism: Secondary | ICD-10-CM

## 2022-08-08 DIAGNOSIS — Z6836 Body mass index (BMI) 36.0-36.9, adult: Secondary | ICD-10-CM

## 2022-08-08 DIAGNOSIS — Z818 Family history of other mental and behavioral disorders: Secondary | ICD-10-CM

## 2022-08-08 LAB — LIPID PANEL
Chol/HDL Ratio: 5.2 ratio — ABNORMAL HIGH (ref 0.0–4.4)
LDL Chol Calc (NIH): 132 mg/dL — ABNORMAL HIGH (ref 0–99)

## 2022-08-08 LAB — CMP14+EGFR
AST: 34 IU/L (ref 0–40)
Alkaline Phosphatase: 113 IU/L (ref 44–121)
BUN: 12 mg/dL (ref 8–27)
Glucose: 114 mg/dL — ABNORMAL HIGH (ref 70–99)
Potassium: 3.8 mmol/L (ref 3.5–5.2)
Total Protein: 7.1 g/dL (ref 6.0–8.5)
eGFR: 66 mL/min/{1.73_m2} (ref >59)

## 2022-08-08 LAB — POCT UA - MICROALBUMIN
Albumin/Creatinine Ratio, Urine, POC: 20.3
Creatinine, POC: 221.2 mg/dL
Microalbumin Ur, POC: 45 mg/L

## 2022-08-08 LAB — CBC WITH DIFFERENTIAL/PLATELET

## 2022-08-08 LAB — HEMOGLOBIN A1C

## 2022-08-08 MED ORDER — TIRZEPATIDE 10 MG/0.5ML ~~LOC~~ SOAJ
10.0000 mg | SUBCUTANEOUS | 1 refills | Status: DC
Start: 2022-08-08 — End: 2022-09-13
  Filled 2022-08-08 – 2022-08-10 (×3): qty 2, 28d supply, fill #0
  Filled 2022-08-31 – 2022-09-09 (×4): qty 2, 28d supply, fill #1

## 2022-08-08 MED ORDER — TIRZEPATIDE 12.5 MG/0.5ML ~~LOC~~ SOAJ
12.5000 mg | SUBCUTANEOUS | 1 refills | Status: DC
Start: 2022-08-08 — End: 2022-09-19
  Filled 2022-08-08 – 2022-09-07 (×3): qty 2, 28d supply, fill #0

## 2022-08-08 NOTE — Progress Notes (Signed)
Katherine Clamp, DNP, AGNP-c [facility] Medicine  [address] [place], [place] [address] [phone]  ESTABLISHED PATIENT- Chronic Health and/or Follow-Up Visit  Blood pressure 122/80, pulse 71, weight 201 lb 3.2 oz (91.3 kg).    [name] is a 62 y.o. year old female presenting today for evaluation and management of chronic conditions.   [name] reports a bruised finger and reports back pain. She injured her finger while turning a bottle and experienced numbness. She also reports having to sit with legs out or on the side due to knee issues, which may have contributed to the back pain. She has also been working on tile at home, which has likely contributed. She has been stretching to alleviate the pain and reports feeling slightly better.   The patient and her husband both experienced gastrointestinal issues, with the patient having diarrhea and [name] experiencing vomiting and diarrhea. She suspects food poisoning or norovirus as the cause. Symptoms have improved at this time.  [name] has recently started using a Dexcom glucose monitor and reports an average blood sugar level of 135. She has not been losing weight and acknowledges the need to increase physical activity. Given her current pain, she plans to try chair yoga to help with this. She mentions experiencing episodes of dizziness and off-balance moments when getting up and moving around. She is unsure if blood sugar levels were okay during these episodes as she was not utilizing the dexcom at that time.  She has had one low blood sugar down to 70. She did have symptoms with this.  Avg 135 92% in range 6% high 1% very high 1% low  She is overdue for her diabetic eye exam and plans to schedule this.   The patient reports difficulty sleeping, staying up until 3 o'clock in the morning, and feeling tired all the time. Exercise has helped with sleep in the past.   She is following a careful diet and monitoring her  carbohydrate intake and keeping her proteins at a good levels.     All ROS negative with exception of what is listed above.   PHYSICAL EXAM Physical Exam Vitals and nursing note reviewed.  Constitutional:      Appearance: Normal appearance.  HENT:     Head: Normocephalic.  Eyes:     Conjunctiva/sclera: Conjunctivae normal.  Neck:     Vascular: No carotid bruit.  Cardiovascular:     Rate and Rhythm: Normal rate and regular rhythm.     Pulses: Normal pulses.     Heart sounds: Normal heart sounds.  Pulmonary:     Effort: Pulmonary effort is normal.     Breath sounds: Normal breath sounds.  Abdominal:     General: Bowel sounds are normal. There is no distension.     Palpations: Abdomen is soft.     Tenderness: There is no abdominal tenderness.  Musculoskeletal:        General: Tenderness present.     Cervical back: Normal range of motion.     Comments: Tenderness noted to the low back with slight decrease in lateral movement.    Skin:    General: Skin is warm.  Neurological:     General: No focal deficit present.     Mental Status: She is alert and oriented to person, place, and time.  Psychiatric:        Mood and Affect: Mood normal.     PLAN Problem List Items Addressed This Visit     Hypothyroidism  Labs pending today for response to management.       Relevant Medications   tirzepatide (MOUNJARO) 12.5 MG/0.5ML Pen   Other Relevant Orders   CBC with Differential/Platelet (Completed)   CMP14+EGFR (Completed)   Lipid panel (Completed)   TSH (Completed)   Hemoglobin A1c (Completed)   POCT UA - Microalbumin (Completed)   Hyperlipidemia associated with type 2 diabetes mellitus (HCC)    Currently managed with rosuvastatin, weight management, diet, and activity.  Plan: - Continue current treatments       Relevant Medications   tirzepatide (MOUNJARO) 12.5 MG/0.5ML Pen   Other Relevant Orders   CBC with Differential/Platelet (Completed)   CMP14+EGFR  (Completed)   Lipid panel (Completed)   TSH (Completed)   Hemoglobin A1c (Completed)   POCT UA - Microalbumin (Completed)   Hypertension complicating diabetes (HCC)    BP is well controlled at this time with no alarm symptoms. Periodic dizziness upon standing, unclear if this is related to blood pressure or blood sugars.  Plan: - Continue current medications - Monitor BP at home and report if BP is not in good control - Stand and move positions slowly. Ensure you are staying well hydrated.       Relevant Medications   tirzepatide (MOUNJARO) 12.5 MG/0.5ML Pen   Other Relevant Orders   CBC with Differential/Platelet (Completed)   CMP14+EGFR (Completed)   Lipid panel (Completed)   TSH (Completed)   Hemoglobin A1c (Completed)   POCT UA - Microalbumin (Completed)   Acute bilateral low back pain without sciatica    Patient reports back pain, possibly related to sitting in awkward positions due to knee issues and working on tile.  Plan: - Encourage continued stretching   - Consider incorporating gentle exercises such as chair yoga to alleviate pain      Type 2 diabetes mellitus with complications (HCC) - Primary    Chronic on associated with HTN, HLD, and obesity. Patient's average blood sugar level is 135, and she recently acquired a Dexcom glucose monitor receiver.  She is utilizing mounjaro for management and has had significant improvement of blood sugar. She has had a few hypoglycemic episodes, likely from missed meals and low protein intake.  Plan: - Continue monitoring blood sugar levels - Consider adjusting medication if necessary - Encourage increased physical activity and healthy diet - Ensure you are not missing meals and your protein intake is steady.      Relevant Medications   tirzepatide (MOUNJARO) 12.5 MG/0.5ML Pen   Other Relevant Orders   CBC with Differential/Platelet (Completed)   CMP14+EGFR (Completed)   Lipid panel (Completed)   TSH (Completed)   Hemoglobin  A1c (Completed)   POCT UA - Microalbumin (Completed)   Insomnia    Patient reports difficulty sleeping and staying up late, with exercise having helped in the past. Plan: - Encourage regular exercise to improve sleep quality - Work to set a routine bedtime and follow good sleep hygiene      Body mass index (BMI) of 36.0-36.9 in adult    Diet, exercise, and medication management in place. She has had successful weight loss with use of mounjaro.  Plan: - Work to increase physical activity and continue current medication      Relevant Medications   tirzepatide (MOUNJARO) 12.5 MG/0.5ML Pen   Other Relevant Orders   CBC with Differential/Platelet (Completed)   CMP14+EGFR (Completed)   Lipid panel (Completed)   TSH (Completed)   Hemoglobin A1c (Completed)   POCT UA - Microalbumin (  Completed)   Pain of finger of right hand    Patient presents with a bruised finger, likely due to minor trauma. Plan: - Advise her to monitor the bruise and report any worsening or lack of improvement      Family history of dementia    Patient expresses concerns about dementia due to family history and is engaging in mental exercises to maintain cognitive function.  Plan: - Encourage continued mental exercises - Maintain social life to support cognitive health         Return for CPE.  Time: 45 minutes, >50% spent counseling, care coordination, chart review, and documentation.   Katherine Clamp, DNP, AGNP-c

## 2022-08-08 NOTE — Patient Instructions (Addendum)
Great job on your blood sugars!! We will see what your A1c shows.   If you have any concerns, please let me know.   Next time we can talk about colon cancer screening recommendations and shingles vaccine.

## 2022-08-09 ENCOUNTER — Other Ambulatory Visit (HOSPITAL_BASED_OUTPATIENT_CLINIC_OR_DEPARTMENT_OTHER): Payer: Self-pay

## 2022-08-09 ENCOUNTER — Encounter: Payer: Self-pay | Admitting: Nurse Practitioner

## 2022-08-09 LAB — CBC WITH DIFFERENTIAL/PLATELET
Basophils Absolute: 0.1 10*3/uL (ref 0.0–0.2)
Eos: 10 %
Hematocrit: 45 % (ref 34.0–46.6)
Hemoglobin: 15.3 g/dL (ref 11.1–15.9)
Immature Grans (Abs): 0 10*3/uL (ref 0.0–0.1)
Immature Granulocytes: 0 %
Lymphs: 33 %
MCH: 30.4 pg (ref 26.6–33.0)
MCHC: 34 g/dL (ref 31.5–35.7)
Monocytes Absolute: 0.6 10*3/uL (ref 0.1–0.9)
Neutrophils: 49 %
Platelets: 179 10*3/uL (ref 150–450)
RDW: 13.8 % (ref 11.7–15.4)

## 2022-08-09 LAB — CMP14+EGFR
ALT: 37 IU/L — ABNORMAL HIGH (ref 0–32)
Albumin: 4.4 g/dL (ref 3.9–4.9)
BUN/Creatinine Ratio: 12 (ref 12–28)
Bilirubin Total: 0.4 mg/dL (ref 0.0–1.2)
CO2: 20 mmol/L (ref 20–29)
Calcium: 9.7 mg/dL (ref 8.7–10.3)
Chloride: 106 mmol/L (ref 96–106)
Creatinine, Ser: 0.97 mg/dL (ref 0.57–1.00)
Globulin, Total: 2.7 g/dL (ref 1.5–4.5)
Sodium: 142 mmol/L (ref 134–144)

## 2022-08-09 LAB — HEMOGLOBIN A1C: Hgb A1c MFr Bld: 6.4 % — ABNORMAL HIGH (ref 4.8–5.6)

## 2022-08-09 LAB — TSH: TSH: 0.376 u[IU]/mL — ABNORMAL LOW (ref 0.450–4.500)

## 2022-08-09 LAB — LIPID PANEL
Cholesterol, Total: 208 mg/dL — ABNORMAL HIGH (ref 100–199)
HDL: 40 mg/dL (ref >39)
Triglycerides: 200 mg/dL — ABNORMAL HIGH (ref 0–149)
VLDL Cholesterol Cal: 36 mg/dL (ref 5–40)

## 2022-08-10 ENCOUNTER — Other Ambulatory Visit (HOSPITAL_BASED_OUTPATIENT_CLINIC_OR_DEPARTMENT_OTHER): Payer: Self-pay

## 2022-08-11 ENCOUNTER — Other Ambulatory Visit (HOSPITAL_BASED_OUTPATIENT_CLINIC_OR_DEPARTMENT_OTHER): Payer: Self-pay

## 2022-08-30 ENCOUNTER — Other Ambulatory Visit (HOSPITAL_BASED_OUTPATIENT_CLINIC_OR_DEPARTMENT_OTHER): Payer: Self-pay

## 2022-08-31 ENCOUNTER — Encounter: Payer: Self-pay | Admitting: Nurse Practitioner

## 2022-09-01 ENCOUNTER — Other Ambulatory Visit: Payer: Self-pay

## 2022-09-06 ENCOUNTER — Other Ambulatory Visit (HOSPITAL_BASED_OUTPATIENT_CLINIC_OR_DEPARTMENT_OTHER): Payer: Self-pay

## 2022-09-07 ENCOUNTER — Other Ambulatory Visit (HOSPITAL_BASED_OUTPATIENT_CLINIC_OR_DEPARTMENT_OTHER): Payer: Self-pay

## 2022-09-08 ENCOUNTER — Other Ambulatory Visit (HOSPITAL_BASED_OUTPATIENT_CLINIC_OR_DEPARTMENT_OTHER): Payer: Self-pay

## 2022-09-09 ENCOUNTER — Other Ambulatory Visit (HOSPITAL_BASED_OUTPATIENT_CLINIC_OR_DEPARTMENT_OTHER): Payer: Self-pay

## 2022-09-13 DIAGNOSIS — Z818 Family history of other mental and behavioral disorders: Secondary | ICD-10-CM | POA: Insufficient documentation

## 2022-09-13 NOTE — Assessment & Plan Note (Signed)
Diet, exercise, and medication management in place. She has had successful weight loss with use of mounjaro.  Plan: - Work to increase physical activity and continue current medication

## 2022-09-13 NOTE — Assessment & Plan Note (Signed)
Labs pending today for response to management.

## 2022-09-13 NOTE — Assessment & Plan Note (Signed)
Patient presents with a bruised finger, likely due to minor trauma. Plan: - Advise her to monitor the bruise and report any worsening or lack of improvement

## 2022-09-13 NOTE — Assessment & Plan Note (Signed)
>>  ASSESSMENT AND PLAN FOR TYPE 2 DIABETES MELLITUS WITH COMPLICATIONS (HCC) WRITTEN ON 09/13/2022  1:57 PM BY Paloma Grange E, NP  Chronic on associated with HTN, HLD, and obesity. Patient's average blood sugar level is 135, and she recently acquired a Dexcom glucose monitor receiver.  She is utilizing mounjaro  for management and has had significant improvement of blood sugar. She has had a few hypoglycemic episodes, likely from missed meals and low protein intake.  Plan: - Continue monitoring blood sugar levels - Consider adjusting medication if necessary - Encourage increased physical activity and healthy diet - Ensure you are not missing meals and your protein intake is steady.

## 2022-09-13 NOTE — Assessment & Plan Note (Signed)
Patient reports difficulty sleeping and staying up late, with exercise having helped in the past. Plan: - Encourage regular exercise to improve sleep quality - Work to set a routine bedtime and follow good sleep hygiene

## 2022-09-13 NOTE — Assessment & Plan Note (Signed)
Chronic on associated with HTN, HLD, and obesity. Patient's average blood sugar level is 135, and she recently acquired a Dexcom glucose monitor receiver.  She is utilizing mounjaro for management and has had significant improvement of blood sugar. She has had a few hypoglycemic episodes, likely from missed meals and low protein intake.  Plan: - Continue monitoring blood sugar levels - Consider adjusting medication if necessary - Encourage increased physical activity and healthy diet - Ensure you are not missing meals and your protein intake is steady.

## 2022-09-13 NOTE — Assessment & Plan Note (Signed)
Patient reports back pain, possibly related to sitting in awkward positions due to knee issues and working on tile.  Plan: - Encourage continued stretching   - Consider incorporating gentle exercises such as chair yoga to alleviate pain

## 2022-09-13 NOTE — Assessment & Plan Note (Signed)
Currently managed with rosuvastatin, weight management, diet, and activity.  Plan: - Continue current treatments

## 2022-09-13 NOTE — Assessment & Plan Note (Signed)
BP is well controlled at this time with no alarm symptoms. Periodic dizziness upon standing, unclear if this is related to blood pressure or blood sugars.  Plan: - Continue current medications - Monitor BP at home and report if BP is not in good control - Stand and move positions slowly. Ensure you are staying well hydrated.

## 2022-09-13 NOTE — Assessment & Plan Note (Signed)
Patient expresses concerns about dementia due to family history and is engaging in mental exercises to maintain cognitive function.  Plan: - Encourage continued mental exercises - Maintain social life to support cognitive health

## 2022-09-17 ENCOUNTER — Other Ambulatory Visit (HOSPITAL_BASED_OUTPATIENT_CLINIC_OR_DEPARTMENT_OTHER): Payer: Self-pay

## 2022-09-19 ENCOUNTER — Other Ambulatory Visit (HOSPITAL_BASED_OUTPATIENT_CLINIC_OR_DEPARTMENT_OTHER): Payer: Self-pay

## 2022-09-19 ENCOUNTER — Other Ambulatory Visit: Payer: Self-pay | Admitting: Nurse Practitioner

## 2022-09-19 DIAGNOSIS — E118 Type 2 diabetes mellitus with unspecified complications: Secondary | ICD-10-CM

## 2022-09-19 DIAGNOSIS — Z6836 Body mass index (BMI) 36.0-36.9, adult: Secondary | ICD-10-CM

## 2022-09-19 DIAGNOSIS — E1169 Type 2 diabetes mellitus with other specified complication: Secondary | ICD-10-CM

## 2022-09-19 DIAGNOSIS — K7581 Nonalcoholic steatohepatitis (NASH): Secondary | ICD-10-CM

## 2022-09-19 DIAGNOSIS — I739 Peripheral vascular disease, unspecified: Secondary | ICD-10-CM

## 2022-09-19 DIAGNOSIS — I152 Hypertension secondary to endocrine disorders: Secondary | ICD-10-CM

## 2022-09-19 MED ORDER — MOUNJARO 15 MG/0.5ML ~~LOC~~ SOAJ
15.0000 mg | SUBCUTANEOUS | 1 refills | Status: DC
Start: 2022-09-19 — End: 2022-10-12
  Filled 2022-09-19: qty 2, 28d supply, fill #0

## 2022-09-21 ENCOUNTER — Other Ambulatory Visit: Payer: Self-pay | Admitting: Nurse Practitioner

## 2022-09-21 ENCOUNTER — Other Ambulatory Visit: Payer: Self-pay

## 2022-09-21 ENCOUNTER — Other Ambulatory Visit (HOSPITAL_BASED_OUTPATIENT_CLINIC_OR_DEPARTMENT_OTHER): Payer: Self-pay

## 2022-09-21 DIAGNOSIS — J0101 Acute recurrent maxillary sinusitis: Secondary | ICD-10-CM

## 2022-09-21 MED ORDER — DESLORATADINE 5 MG PO TABS
5.0000 mg | ORAL_TABLET | Freq: Every day | ORAL | 5 refills | Status: DC
Start: 2022-09-21 — End: 2023-02-16
  Filled 2022-09-21: qty 30, 30d supply, fill #0
  Filled 2022-10-21: qty 30, 30d supply, fill #1
  Filled 2022-11-18: qty 30, 30d supply, fill #2
  Filled 2022-12-19: qty 30, 30d supply, fill #3

## 2022-09-27 ENCOUNTER — Encounter (HOSPITAL_COMMUNITY): Payer: Self-pay

## 2022-10-04 ENCOUNTER — Other Ambulatory Visit (HOSPITAL_COMMUNITY): Payer: Self-pay | Admitting: Cardiology

## 2022-10-04 DIAGNOSIS — I6523 Occlusion and stenosis of bilateral carotid arteries: Secondary | ICD-10-CM

## 2022-10-07 ENCOUNTER — Other Ambulatory Visit: Payer: Self-pay

## 2022-10-07 ENCOUNTER — Other Ambulatory Visit: Payer: Self-pay | Admitting: Nurse Practitioner

## 2022-10-07 ENCOUNTER — Other Ambulatory Visit (HOSPITAL_BASED_OUTPATIENT_CLINIC_OR_DEPARTMENT_OTHER): Payer: Self-pay

## 2022-10-07 ENCOUNTER — Ambulatory Visit (HOSPITAL_COMMUNITY)
Admission: RE | Admit: 2022-10-07 | Discharge: 2022-10-07 | Disposition: A | Discharge status: Home / Self Care | Payer: BC Managed Care – PPO | Source: Ambulatory Visit | Attending: Cardiovascular Disease | Admitting: Cardiovascular Disease

## 2022-10-07 DIAGNOSIS — F418 Other specified anxiety disorders: Secondary | ICD-10-CM

## 2022-10-07 DIAGNOSIS — I6523 Occlusion and stenosis of bilateral carotid arteries: Secondary | ICD-10-CM | POA: Diagnosis not present

## 2022-10-09 MED ORDER — ALPRAZOLAM 0.25 MG PO TABS
0.2500 mg | ORAL_TABLET | Freq: Two times a day (BID) | ORAL | 2 refills | Status: DC | PRN
  Filled 2022-10-09: qty 60, 30d supply, fill #0

## 2022-10-10 ENCOUNTER — Other Ambulatory Visit: Payer: Self-pay | Admitting: Nurse Practitioner

## 2022-10-10 ENCOUNTER — Other Ambulatory Visit (HOSPITAL_COMMUNITY): Payer: Self-pay

## 2022-10-10 ENCOUNTER — Other Ambulatory Visit (HOSPITAL_BASED_OUTPATIENT_CLINIC_OR_DEPARTMENT_OTHER): Payer: Self-pay

## 2022-10-10 DIAGNOSIS — I6523 Occlusion and stenosis of bilateral carotid arteries: Secondary | ICD-10-CM

## 2022-10-10 DIAGNOSIS — Z6836 Body mass index (BMI) 36.0-36.9, adult: Secondary | ICD-10-CM

## 2022-10-10 DIAGNOSIS — E118 Type 2 diabetes mellitus with unspecified complications: Secondary | ICD-10-CM

## 2022-10-10 DIAGNOSIS — E890 Postprocedural hypothyroidism: Secondary | ICD-10-CM

## 2022-10-10 DIAGNOSIS — E785 Hyperlipidemia, unspecified: Secondary | ICD-10-CM

## 2022-10-10 DIAGNOSIS — E1159 Type 2 diabetes mellitus with other circulatory complications: Secondary | ICD-10-CM

## 2022-10-10 DIAGNOSIS — I6521 Occlusion and stenosis of right carotid artery: Secondary | ICD-10-CM

## 2022-10-12 ENCOUNTER — Encounter: Payer: Self-pay | Admitting: Nurse Practitioner

## 2022-10-12 DIAGNOSIS — E118 Type 2 diabetes mellitus with unspecified complications: Secondary | ICD-10-CM

## 2022-10-12 DIAGNOSIS — I152 Hypertension secondary to endocrine disorders: Secondary | ICD-10-CM

## 2022-10-12 DIAGNOSIS — E785 Hyperlipidemia, unspecified: Secondary | ICD-10-CM

## 2022-10-12 MED ORDER — MOUNJARO 12.5 MG/0.5ML ~~LOC~~ SOAJ
12.5000 mg | SUBCUTANEOUS | 1 refills | Status: DC
Start: 2022-10-12 — End: 2023-06-15
  Filled 2022-10-12: qty 2, 28d supply, fill #0
  Filled 2022-11-27: qty 2, 28d supply, fill #1
  Filled 2022-12-31: qty 2, 28d supply, fill #2
  Filled 2023-01-24: qty 2, 28d supply, fill #3
  Filled 2023-02-28: qty 2, 28d supply, fill #4
  Filled 2023-05-15: qty 2, 28d supply, fill #5

## 2022-10-13 ENCOUNTER — Other Ambulatory Visit: Payer: Self-pay

## 2022-10-13 ENCOUNTER — Other Ambulatory Visit (HOSPITAL_BASED_OUTPATIENT_CLINIC_OR_DEPARTMENT_OTHER): Payer: Self-pay

## 2022-10-14 ENCOUNTER — Other Ambulatory Visit (HOSPITAL_BASED_OUTPATIENT_CLINIC_OR_DEPARTMENT_OTHER): Payer: Self-pay

## 2022-10-20 ENCOUNTER — Other Ambulatory Visit (HOSPITAL_BASED_OUTPATIENT_CLINIC_OR_DEPARTMENT_OTHER): Payer: Self-pay

## 2022-10-26 ENCOUNTER — Other Ambulatory Visit (HOSPITAL_BASED_OUTPATIENT_CLINIC_OR_DEPARTMENT_OTHER): Payer: Self-pay

## 2022-11-10 ENCOUNTER — Other Ambulatory Visit (HOSPITAL_BASED_OUTPATIENT_CLINIC_OR_DEPARTMENT_OTHER): Payer: Self-pay

## 2022-11-10 ENCOUNTER — Encounter: Payer: Self-pay | Admitting: Nurse Practitioner

## 2022-11-10 ENCOUNTER — Ambulatory Visit (INDEPENDENT_AMBULATORY_CARE_PROVIDER_SITE_OTHER): Payer: BC Managed Care – PPO | Admitting: Nurse Practitioner

## 2022-11-10 ENCOUNTER — Other Ambulatory Visit (HOSPITAL_BASED_OUTPATIENT_CLINIC_OR_DEPARTMENT_OTHER): Payer: Self-pay | Admitting: Nurse Practitioner

## 2022-11-10 VITALS — BP 128/82 | HR 84 | Ht 64.25 in | Wt 191.8 lb

## 2022-11-10 DIAGNOSIS — J324 Chronic pansinusitis: Secondary | ICD-10-CM

## 2022-11-10 DIAGNOSIS — F418 Other specified anxiety disorders: Secondary | ICD-10-CM

## 2022-11-10 DIAGNOSIS — E1169 Type 2 diabetes mellitus with other specified complication: Secondary | ICD-10-CM

## 2022-11-10 DIAGNOSIS — E118 Type 2 diabetes mellitus with unspecified complications: Secondary | ICD-10-CM

## 2022-11-10 DIAGNOSIS — M2241 Chondromalacia patellae, right knee: Secondary | ICD-10-CM

## 2022-11-10 DIAGNOSIS — E1159 Type 2 diabetes mellitus with other circulatory complications: Secondary | ICD-10-CM | POA: Diagnosis not present

## 2022-11-10 DIAGNOSIS — I739 Peripheral vascular disease, unspecified: Secondary | ICD-10-CM

## 2022-11-10 DIAGNOSIS — E890 Postprocedural hypothyroidism: Secondary | ICD-10-CM | POA: Diagnosis not present

## 2022-11-10 DIAGNOSIS — I10 Essential (primary) hypertension: Secondary | ICD-10-CM

## 2022-11-10 DIAGNOSIS — E559 Vitamin D deficiency, unspecified: Secondary | ICD-10-CM | POA: Diagnosis not present

## 2022-11-10 DIAGNOSIS — E139 Other specified diabetes mellitus without complications: Secondary | ICD-10-CM

## 2022-11-10 DIAGNOSIS — M19011 Primary osteoarthritis, right shoulder: Secondary | ICD-10-CM

## 2022-11-10 DIAGNOSIS — E785 Hyperlipidemia, unspecified: Secondary | ICD-10-CM | POA: Diagnosis not present

## 2022-11-10 DIAGNOSIS — K7581 Nonalcoholic steatohepatitis (NASH): Secondary | ICD-10-CM

## 2022-11-10 DIAGNOSIS — I152 Hypertension secondary to endocrine disorders: Secondary | ICD-10-CM | POA: Diagnosis not present

## 2022-11-10 DIAGNOSIS — Z23 Encounter for immunization: Secondary | ICD-10-CM

## 2022-11-10 DIAGNOSIS — J4540 Moderate persistent asthma, uncomplicated: Secondary | ICD-10-CM

## 2022-11-10 DIAGNOSIS — Z6836 Body mass index (BMI) 36.0-36.9, adult: Secondary | ICD-10-CM

## 2022-11-10 LAB — COMPREHENSIVE METABOLIC PANEL
ALT: 22 IU/L (ref 0–32)
AST: 25 IU/L (ref 0–40)
Albumin: 4.6 g/dL (ref 3.9–4.9)
Alkaline Phosphatase: 125 IU/L — ABNORMAL HIGH (ref 44–121)
BUN/Creatinine Ratio: 17 (ref 12–28)
BUN: 18 mg/dL (ref 8–27)
Bilirubin Total: 0.4 mg/dL (ref 0.0–1.2)
CO2: 19 mmol/L — ABNORMAL LOW (ref 20–29)
Calcium: 10.3 mg/dL (ref 8.7–10.3)
Chloride: 102 mmol/L (ref 96–106)
Creatinine, Ser: 1.04 mg/dL — ABNORMAL HIGH (ref 0.57–1.00)
Glucose: 110 mg/dL — ABNORMAL HIGH (ref 70–99)
Potassium: 4.6 mmol/L (ref 3.5–5.2)
Sodium: 139 mmol/L (ref 134–144)
eGFR: 61 mL/min/{1.73_m2} (ref >59)

## 2022-11-10 LAB — CBC WITH DIFFERENTIAL/PLATELET
Basophils Absolute: 0.1 10*3/uL (ref 0.0–0.2)
Basos: 1 %
EOS (ABSOLUTE): 0.3 10*3/uL (ref 0.0–0.4)
Eos: 4 %
Hematocrit: 49.9 % — ABNORMAL HIGH (ref 34.0–46.6)
Hemoglobin: 16.1 g/dL — ABNORMAL HIGH (ref 11.1–15.9)
Immature Grans (Abs): 0 10*3/uL (ref 0.0–0.1)
Immature Granulocytes: 0 %
Lymphocytes Absolute: 2.5 10*3/uL (ref 0.7–3.1)
Lymphs: 31 %
MCH: 29 pg (ref 26.6–33.0)
MCHC: 32.3 g/dL (ref 31.5–35.7)
MCV: 90 fL (ref 79–97)
Monocytes Absolute: 0.6 10*3/uL (ref 0.1–0.9)
Monocytes: 7 %
Neutrophils Absolute: 4.4 10*3/uL (ref 1.4–7.0)
Neutrophils: 57 %
Platelets: 214 10*3/uL (ref 150–450)
RBC: 5.55 x10E6/uL — ABNORMAL HIGH (ref 3.77–5.28)
RDW: 11.8 % (ref 11.7–15.4)
WBC: 7.9 10*3/uL (ref 3.4–10.8)

## 2022-11-10 LAB — LIPID PANEL

## 2022-11-10 LAB — TSH

## 2022-11-10 LAB — HEMOGLOBIN A1C
Est. average glucose Bld gHb Est-mCnc: 134 mg/dL
Hgb A1c MFr Bld: 6.3 % — ABNORMAL HIGH (ref 4.8–5.6)

## 2022-11-10 LAB — T4, FREE

## 2022-11-10 LAB — VITAMIN D 25 HYDROXY (VIT D DEFICIENCY, FRACTURES)

## 2022-11-10 MED ORDER — LEVOFLOXACIN 500 MG PO TABS
500.0000 mg | ORAL_TABLET | Freq: Every day | ORAL | 0 refills | Status: AC
Start: 2022-11-10 — End: 2022-11-17
  Filled 2022-11-10: qty 7, 7d supply, fill #0

## 2022-11-10 MED ORDER — PREDNISONE 20 MG PO TABS
40.0000 mg | ORAL_TABLET | Freq: Every day | ORAL | 0 refills | Status: DC
Start: 2022-11-10 — End: 2023-02-06
  Filled 2022-11-10: qty 10, 5d supply, fill #0

## 2022-11-10 NOTE — Assessment & Plan Note (Signed)
Chronic. Historically well managed. No concerns are present today. Labs are pending  -continue current therapy

## 2022-11-10 NOTE — Assessment & Plan Note (Addendum)
>>  ASSESSMENT AND PLAN FOR DIABETES 1.5, MANAGED AS TYPE 2 (HCC) WRITTEN ON 11/10/2022  9:46 PM BY Roseland Braun E, NP  Chronic condition. Will monitor control today with labs. No alarm symptoms present.    >>ASSESSMENT AND PLAN FOR TYPE 2 DIABETES MELLITUS WITH COMPLICATIONS (HCC) WRITTEN ON 11/10/2022  9:48 PM BY Magdalene Tardiff E, NP  Chronic in the setting of HTN, HLD, hypothyroidism, and obesity. Monitoring labs today.  - continue current therapy

## 2022-11-10 NOTE — Assessment & Plan Note (Signed)
Recent exacerbation with symptoms improving. -Continue current management plan. - Prednisone script in place in event severe exacerbation presents.  - Antibiotic therapy sent to have on hand, as well.

## 2022-11-10 NOTE — Assessment & Plan Note (Signed)
Reports of increased stress and anxiety due to personal and family issues. Xanax appears to be effective. She is taking as directed with no concerns for increased use.  -Continue Xanax as needed.

## 2022-11-10 NOTE — Assessment & Plan Note (Signed)
Chronic. Well controlled. No alarm symptoms at this time. Labs are pending for evaluation.  -continue current therapy.

## 2022-11-10 NOTE — Assessment & Plan Note (Signed)
Reports of pain in hip and knees, possibly related to recent physical activity and sitting position. -Consider resuming physical therapy or self-directed exercises.

## 2022-11-10 NOTE — Patient Instructions (Signed)
It was great to see you today!  Keep working on your diet and exercise for overall health management.   Please let us know where you have had your eye exam completed so we can request the report and update our records.

## 2022-11-10 NOTE — Assessment & Plan Note (Signed)
No concerns with edema, discoloration, or temperature changes to extremities.

## 2022-11-10 NOTE — Assessment & Plan Note (Signed)
Chronic in the setting of HTN, HLD, hypothyroidism, and obesity. Monitoring labs today.  - continue current therapy

## 2022-11-10 NOTE — Assessment & Plan Note (Signed)
Chronic. Last labs stable. No changes at this time

## 2022-11-10 NOTE — Assessment & Plan Note (Signed)
Repeat labs today

## 2022-11-10 NOTE — Assessment & Plan Note (Signed)
Currently managing with diet, activity, and medications for control of diabetes. Labs pending - continue current therapy.

## 2022-11-10 NOTE — Progress Notes (Signed)
Katherine Clamp, DNP, AGNP-c [name] [address] [place], [place] [address] Main Office [phone]  BP 128/82   Pulse 84   Ht 5' 4.25" (1.632 m)   Wt 191 lb 12.8 oz (87 kg)   BMI 32.67 kg/m    Subjective:    Patient ID: [name], female    DOB: [birth year], 62 y.o.   MRN: [id number]  HPI: [name] is a 62 y.o. female presenting on 11/10/2022 for comprehensive medical examination.   Current medical concerns include: [name] presents with a complex history of personal and familial stressors, including the recent death of a brother due to a motorcycle accident and ongoing family concerns with her son recently separating from his wife and her husband's failing health. She also reports concerns about her husband's mental health, suspecting possible dementia or Alzheimer's disease due to observed behavioral changes and agitation.   She has a history of asthma, which has been exacerbated recently, leading to an asthma attack that lasted from Saturday to Tuesday night during a storm. The patient reports sinus pressure and a recent ear infection, which have been managed with sinus medication and Mucinex. She feels the sinus symptoms are improving at this time.   She experienced a recent fall, resulting in knee pain and difficulty kneeling. She also reports a sharp, squeezing chest pain that radiated to the shoulder, which occurred after taking a decongestant and consuming caffeine, but she mentions this has not returned and admits that this could have been stress related. She has been managing stress and anxiety with Xanax, which she reports as effective.  The patient's overall health appears to be impacted by multiple stressors, both physical and emotional, and she expresses a need for self-care amidst caring for others.   Pertinent items are noted in HPI.  IMMUNIZATIONS:   Flu Vaccine: Flu vaccine contraindicated Prevnar 13: Prevnar 13 completed, documentation  in chart Prevnar 20: Prevnar 20 contraindicated Pneumovax 23: Pneumovax contraindicated Vac Shingrix: Shingrix 1st dose completed, documentation in chart HPV: N/A or Aged Out Tetanus: Tetanus completed in the last 10 years COVID: Due, completed today RSV: No  HEALTH MAINTENANCE: Pap Smear HM Status: is up to date and due 07/2024 Mammogram HM Status: is up to date and due 06/2023 Colon Cancer Screening HM Status: is up to date and due 08/2023 Bone Density HM Status: N/A STI Testing HM Status: was declined  Lung CT HM Status: N/A  Concerns with vision, hearing, or dentition: No   Most Recent Depression Screen:     06/14/2021    3:47 PM 05/11/2020    3:01 PM 09/12/2018    1:08 PM 08/29/2017    2:22 PM 11/25/2016    8:44 AM  Depression screen PHQ 2/9  Decreased Interest 0 0 0 0 0  Down, Depressed, Hopeless 0 0 0 0 0  PHQ - 2 Score 0 0 0 0 0  Altered sleeping  3 2 1    Tired, decreased energy   1 1   Change in appetite  0 0 0   Feeling bad or failure about yourself   0 0 0   Trouble concentrating  0 0 0   Moving slowly or fidgety/restless  0 0 0   Suicidal thoughts  0 0 0   PHQ-9 Score  3 3 2    Difficult doing work/chores  Not difficult at all Not difficult at all Not difficult at all    Most Recent Anxiety Screen:  05/11/2020    3:02 PM 09/12/2018    1:08 PM 08/29/2017    2:23 PM  GAD 7 : Generalized Anxiety Score  Nervous, Anxious, on Edge 0 0 0  Control/stop worrying 0 0 0  Worry too much - different things 0 0 0  Trouble relaxing 0 0 0  Restless 0 0 0  Easily annoyed or irritable 0 0 0  Afraid - awful might happen 0 0 0  Total GAD 7 Score 0 0 0  Anxiety Difficulty Not difficult at all Not difficult at all Not difficult at all   Most Recent Fall Screen:    08/08/2022    9:39 AM 08/04/2020    5:14 PM 05/11/2020    3:01 PM 12/03/2018    1:48 PM 08/29/2017    2:22 PM  Fall Risk   Falls in the past year? 0 0  0 No  Number falls in past yr: 0 0 0    Injury with  Fall? 0 0     Risk for fall due to : No Fall Risks No Fall Risks No Fall Risks    Follow up Falls evaluation completed Falls evaluation completed       Past medical history, surgical history, medications, allergies, family history and social history reviewed with patient today and changes made to appropriate areas of the chart.  Past Medical History:  Past Medical History:  Diagnosis Date   Abdominal bloating 11/05/2020   Abnormality of right breast on screening mammogram 10/14/2019   Needs further views of right breast.  [facility] to call.       Achilles tendon contracture, right    Acute non-recurrent frontal sinusitis 12/24/2019   Acute recurrent pansinusitis 08/04/2020   ALLERGIC RHINITIS 04/10/2008   Allergic rhinitis 04/10/2008   Qualifier: Diagnosis of  By: Amador Cunas  MD, Janett Labella    Asthma    asthmatic bronchitis   Breast lump in female    Carpal tunnel syndrome of right wrist    Cellulitis 10/31/2019   Chronic right shoulder pain 05/07/2019   DEPRESSION 08/13/2008   Diabetes mellitus without complication (HCC)    Fatigue 11/17/2015   Fatty liver disease, nonalcoholic 03/29/2013   Hepatitis    fatty liver   HYPERLIPIDEMIA 08/04/2006   HYPERTENSION 08/04/2006   HYPOTHYROIDISM 08/04/2006   Insomnia 10/31/2019   Kidney injury    LOW BACK PAIN 08/04/2006   Moderate persistent asthma with (acute) exacerbation 06/27/2019   Neuromuscular disorder (HCC)    Pain of finger of right hand 02/09/2021   Patellofemoral syndrome, right 06/19/2019   PERIMENOPAUSAL SYNDROME 05/08/2008   Plantar fasciitis of right foot 04/12/2017   Plantar fasciitis, left 11/17/2015   Postmenopausal bleeding 07/17/2019   Biopsy and [place] c/w endometrial polyp.  Pt to see Dr. Earlene Plater for surgical consult.     Primary insomnia 10/31/2019   PVD (peripheral vascular disease) (HCC)    occlusive, status post bifem bypass 2007   SOB (shortness of breath) 07/12/2016   Vaginal candidiasis 10/31/2019    Medications:  Current Outpatient Medications on File Prior to Visit  Medication Sig   albuterol (PROVENTIL) (2.5 MG/3ML) 0.083% nebulizer solution Take 3 mLs (2.5 mg total) by nebulization every 6 (six) hours as needed for wheezing or shortness of breath.   albuterol (VENTOLIN HFA) 108 (90 Base) MCG/ACT inhaler Inhale 1-2 puffs into the lungs every 4 (four) hours as needed for wheezing or shortness of breath.   Albuterol-Budesonide (AIRSUPRA) 90-80 MCG/ACT AERO Inhale 1-2  Inhalations into the lungs every 6 (six) hours as needed (Wheezing, cough, shortness of breath).   ALPRAZolam (XANAX) 0.25 MG tablet Take 1 tablet (0.25 mg total) by mouth 2 (two) times daily as needed for anxiety.   aspirin EC 81 MG tablet Take 1 tablet (81 mg total) by mouth daily. Swallow whole.   Azelaic Acid 15 % gel After skin is thoroughly washed and patted dry, gently but thoroughly massage a thin film of azelaic acid cream into the affected area twice daily, in the morning and evening.   azelastine (ASTELIN) 0.1 % nasal spray Place 1 spray into both nostrils daily as needed for rhinitis.   B Complex Vitamins (VITAMIN B COMPLEX PO) Take 1 tablet by mouth daily.   benazepril (LOTENSIN) 10 MG tablet Take 1 tablet (10 mg total) by mouth daily.   celecoxib (CELEBREX) 200 MG capsule Take 1 capsule (200 mg total) by mouth daily.   Continuous Glucose Receiver (DEXCOM G7 RECEIVER) DEVI For use with Dexcom G7 sensor   Continuous Glucose Sensor (DEXCOM G7 SENSOR) MISC Apply new sensor every 10 days for continuous glucose monitoring.   desloratadine (CLARINEX) 5 MG tablet Take 1 tablet (5 mg total) by mouth daily.   fluticasone furoate-vilanterol (BREO ELLIPTA) 100-25 MCG/ACT AEPB Inhale 1 puff into the lungs once daily.   levothyroxine (SYNTHROID) 112 MCG tablet Take 1 tablet (112 mcg total) by mouth daily.   Menthol, Topical Analgesic, (ICY HOT EX) Apply 1 application topically daily as needed (pain).   metoprolol succinate  (TOPROL-XL) 100 MG 24 hr tablet Take 1 tablet (100 mg total) by mouth daily. Take with or immediately following a meal.   pantoprazole (PROTONIX) 40 MG tablet Take 1 tablet (40 mg total) by mouth daily.   rosuvastatin (CRESTOR) 40 MG tablet Take 1 tablet (40 mg total) by mouth daily.   Tiotropium Bromide Monohydrate (SPIRIVA RESPIMAT) 2.5 MCG/ACT AERS Inhale 2 puffs into the lungs once daily.   tirzepatide (MOUNJARO) 12.5 MG/0.5ML Pen Inject 12.5 mg into the skin once a week.   venlafaxine XR (EFFEXOR-XR) 75 MG 24 hr capsule Take 3 capsules (225 mg total) by mouth daily with breakfast.   Vitamin D, Ergocalciferol, (DRISDOL) 1.25 MG (50000 UNIT) CAPS capsule Take 1 capsule (50,000 Units total) by mouth every 7 (seven) days.   No current facility-administered medications on file prior to visit.   Surgical History:  Past Surgical History:  Procedure Laterality Date   CARPAL TUNNEL RELEASE Right 04/05/2016   Procedure: OPEN RIGHT CARPAL TUNNEL RELEASE;  Surgeon: Kerrin Champagne, MD;  Location: [facility];  Service: Orthopedics;  Laterality: Right;   CHOLECYSTECTOMY     FEMORAL BYPASS     bifem.   HAGLAND'S DEFORMITY EXCISION     PLANTAR FASCIA RELEASE Right 04/18/2018   Procedure: PLANTAR FASCIA RELEASE AND GASTROCNEMIUS RECESSION RIGHT;  Surgeon: Nadara Mustard, MD;  Location: [facility];  Service: Orthopedics;  Laterality: Right;   TUBAL LIGATION     Allergies:  Allergies  Allergen Reactions   Influenza Virus Vacc Split Pf Swelling and Other (See Comments)    Swelling around injection site   Lactose Nausea And Vomiting   Lipitor [Atorvastatin] Other (See Comments)    Hepatis    Family History:  Family History  Problem Relation Age of Onset   Hypertension Mother    Heart disease Father    Hyperlipidemia Father    Hypertension Father    Alcohol abuse Brother    Diabetes  Maternal Grandmother    Alcohol abuse Maternal Uncle    Stroke Maternal Uncle    Ovarian cancer Daughter         Objective:    BP 128/82   Pulse 84   Ht 5' 4.25" (1.632 m)   Wt 191 lb 12.8 oz (87 kg)   BMI 32.67 kg/m   Wt Readings from Last 3 Encounters:  11/10/22 191 lb 12.8 oz (87 kg)  08/08/22 201 lb 3.2 oz (91.3 kg)  07/12/22 203 lb 6.4 oz (92.3 kg)    Physical Exam Vitals and nursing note reviewed.  Constitutional:      General: She is not in acute distress.    Appearance: Normal appearance.  HENT:     Head: Normocephalic and atraumatic.     Right Ear: Hearing, tympanic membrane, ear canal and external ear normal.     Left Ear: Hearing, tympanic membrane, ear canal and external ear normal.     Nose:     Right Sinus: Maxillary sinus tenderness and frontal sinus tenderness present.     Left Sinus: Maxillary sinus tenderness present. No frontal sinus tenderness.     Mouth/Throat:     Lips: Pink.     Mouth: Mucous membranes are moist.     Pharynx: Oropharynx is clear.  Eyes:     General: Lids are normal. Vision grossly intact.     Extraocular Movements: Extraocular movements intact.     Conjunctiva/sclera: Conjunctivae normal.     Pupils: Pupils are equal, round, and reactive to light.     Funduscopic exam:    Right eye: Red reflex present.        Left eye: Red reflex present.    Visual Fields: Right eye visual fields normal and left eye visual fields normal.  Neck:     Thyroid: No thyromegaly.     Vascular: No carotid bruit.  Cardiovascular:     Rate and Rhythm: Normal rate and regular rhythm.     Chest Wall: PMI is not displaced.     Pulses: Normal pulses.          Dorsalis pedis pulses are 2+ on the right side and 2+ on the left side.       Posterior tibial pulses are 2+ on the right side and 2+ on the left side.     Heart sounds: Normal heart sounds. No murmur heard. Pulmonary:     Effort: Pulmonary effort is normal. No respiratory distress.     Breath sounds: Normal breath sounds.  Abdominal:     General: Abdomen is flat. Bowel sounds are normal. There is no  distension.     Palpations: Abdomen is soft. There is no hepatomegaly, splenomegaly or mass.     Tenderness: There is no abdominal tenderness. There is no right CVA tenderness, left CVA tenderness, guarding or rebound.  Musculoskeletal:        General: Normal range of motion.     Cervical back: Full passive range of motion without pain, normal range of motion and neck supple. No tenderness.     Right lower leg: No edema.     Left lower leg: No edema.  Feet:     Left foot:     Toenail Condition: Left toenails are normal.  Lymphadenopathy:     Cervical: No cervical adenopathy.     Upper Body:     Right upper body: No supraclavicular adenopathy.     Left upper body: No supraclavicular adenopathy.  Skin:  General: Skin is warm and dry.     Capillary Refill: Capillary refill takes less than 2 seconds.     Nails: There is no clubbing.  Neurological:     General: No focal deficit present.     Mental Status: She is alert and oriented to person, place, and time.     GCS: GCS eye subscore is 4. GCS verbal subscore is 5. GCS motor subscore is 6.     Sensory: Sensation is intact.     Motor: Motor function is intact.     Coordination: Coordination is intact.     Gait: Gait is intact.     Deep Tendon Reflexes: Reflexes are normal and symmetric.  Psychiatric:        Attention and Perception: Attention normal.        Mood and Affect: Mood normal.        Speech: Speech normal.        Behavior: Behavior normal. Behavior is cooperative.        Thought Content: Thought content normal.        Cognition and Memory: Cognition and memory normal.        Judgment: Judgment normal.     Results for orders placed or performed in visit on 11/10/22  Hemoglobin A1c  Result Value Ref Range   Hgb A1c MFr Bld 6.3 (H) 4.8 - 5.6 %   Est. average glucose Bld gHb Est-mCnc 134 mg/dL  CBC with Differential/Platelet  Result Value Ref Range   WBC 7.9 3.4 - 10.8 x10E3/uL   RBC 5.55 (H) 3.77 - 5.28 x10E6/uL    Hemoglobin 16.1 (H) 11.1 - 15.9 g/dL   Hematocrit 16.1 (H) 09.6 - 46.6 %   MCV 90 79 - 97 fL   MCH 29.0 26.6 - 33.0 pg   MCHC 32.3 31.5 - 35.7 g/dL   RDW 04.5 40.9 - 81.1 %   Platelets 214 150 - 450 x10E3/uL   Neutrophils 57 Not Estab. %   Lymphs 31 Not Estab. %   Monocytes 7 Not Estab. %   Eos 4 Not Estab. %   Basos 1 Not Estab. %   Neutrophils Absolute 4.4 1.4 - 7.0 x10E3/uL   Lymphocytes Absolute 2.5 0.7 - 3.1 x10E3/uL   Monocytes Absolute 0.6 0.1 - 0.9 x10E3/uL   EOS (ABSOLUTE) 0.3 0.0 - 0.4 x10E3/uL   Basophils Absolute 0.1 0.0 - 0.2 x10E3/uL   Immature Granulocytes 0 Not Estab. %   Immature Grans (Abs) 0.0 0.0 - 0.1 x10E3/uL  Comprehensive metabolic panel  Result Value Ref Range   Glucose WILL FOLLOW    BUN WILL FOLLOW    Creatinine, Ser WILL FOLLOW    eGFR WILL FOLLOW    BUN/Creatinine Ratio WILL FOLLOW    Sodium WILL FOLLOW    Potassium WILL FOLLOW    Chloride WILL FOLLOW    CO2 WILL FOLLOW    Calcium WILL FOLLOW    Total Protein WILL FOLLOW    Albumin WILL FOLLOW    Globulin, Total WILL FOLLOW    Bilirubin Total WILL FOLLOW    Alkaline Phosphatase WILL FOLLOW    AST WILL FOLLOW    ALT WILL FOLLOW   Lipid panel  Result Value Ref Range   Cholesterol, Total WILL FOLLOW    Triglycerides WILL FOLLOW    HDL WILL FOLLOW    VLDL Cholesterol Cal WILL FOLLOW    LDL Chol Calc (NIH) WILL FOLLOW    LDL CALC COMMENT: WILL FOLLOW  Chol/HDL Ratio WILL FOLLOW   TSH  Result Value Ref Range   TSH WILL FOLLOW   T4, free  Result Value Ref Range   Free T4 WILL FOLLOW   VITAMIN D 25 Hydroxy (Vit-D Deficiency, Fractures)  Result Value Ref Range   Vit D, 25-Hydroxy WILL FOLLOW          Assessment & Plan:   Problem List Items Addressed This Visit     Hypothyroidism    Chronic. Last labs stable. No changes at this time      Relevant Orders   CBC with Differential/Platelet (Completed)   TSH (Completed)   T4, free (Completed)   Hyperlipidemia associated with  type 2 diabetes mellitus (HCC)    Chronic. Historically well managed. No concerns are present today. Labs are pending  -continue current therapy      Relevant Orders   Hemoglobin A1c (Completed)   CBC with Differential/Platelet (Completed)   Comprehensive metabolic panel (Completed)   Lipid panel (Completed)   Hypertension complicating diabetes (HCC) - Primary    Chronic. Well controlled. No alarm symptoms at this time. Labs are pending for evaluation.  -continue current therapy.       Relevant Orders   Hemoglobin A1c (Completed)   CBC with Differential/Platelet (Completed)   Comprehensive metabolic panel (Completed)   Lipid panel (Completed)   Peripheral vascular disease (HCC)    No concerns with edema, discoloration, or temperature changes to extremities.       Type 2 diabetes mellitus with complications (HCC)    Chronic in the setting of HTN, HLD, hypothyroidism, and obesity. Monitoring labs today.  - continue current therapy       Relevant Orders   Hemoglobin A1c (Completed)   CBC with Differential/Platelet (Completed)   Comprehensive metabolic panel (Completed)   Lipid panel (Completed)   Nonalcoholic steatohepatitis (NASH)    Repeat labs today      Relevant Orders   Hemoglobin A1c (Completed)   CBC with Differential/Platelet (Completed)   Comprehensive metabolic panel (Completed)   Lipid panel (Completed)   Situational anxiety    Reports of increased stress and anxiety due to personal and family issues. Xanax appears to be effective. She is taking as directed with no concerns for increased use.  -Continue Xanax as needed.       Body mass index (BMI) of 36.0-36.9 in adult    Currently managing with diet, activity, and medications for control of diabetes. Labs pending - continue current therapy.       Relevant Orders   Hemoglobin A1c (Completed)   CBC with Differential/Platelet (Completed)   Comprehensive metabolic panel (Completed)   Lipid panel  (Completed)   TSH (Completed)   T4, free (Completed)   Moderate persistent asthma without complication    Recent exacerbation with symptoms improving. -Continue current management plan. - Prednisone script in place in event severe exacerbation presents.  - Antibiotic therapy sent to have on hand, as well.       Relevant Medications   predniSONE (DELTASONE) 20 MG tablet   Chondromalacia of right patella    Reports of pain in hip and knees, possibly related to recent physical activity and sitting position. -Consider resuming physical therapy or self-directed exercises.      Relevant Medications   predniSONE (DELTASONE) 20 MG tablet   Diabetes 1.5, managed as type 2 (HCC)    Chronic condition. Will monitor control today with labs. No alarm symptoms present.       Relevant Orders  Hemoglobin A1c (Completed)   CBC with Differential/Platelet (Completed)   Comprehensive metabolic panel (Completed)   Lipid panel (Completed)   Vitamin D deficiency   Relevant Orders   VITAMIN D 25 Hydroxy (Vit-D Deficiency, Fractures) (Completed)   Other Visit Diagnoses     Need for COVID-19 vaccine       Relevant Orders   Pfizer Comirnaty Covid -19 Vaccine 59yrs and older (Completed)   Chronic pansinusitis       Relevant Medications   levofloxacin (LEVAQUIN) 500 MG tablet   predniSONE (DELTASONE) 20 MG tablet   Other Relevant Orders   CBC with Differential/Platelet (Completed)          Follow up plan: Return in about 6 months (around 05/10/2023) for Med Management 30.  NEXT PREVENTATIVE PHYSICAL DUE IN 1 YEAR.  PATIENT COUNSELING PROVIDED FOR ALL ADULT PATIENTS: A well balanced diet low in saturated fats, cholesterol, and moderation in carbohydrates.  This can be as simple as monitoring portion sizes and cutting back on sugary beverages such as soda and juice to start with.    Daily water consumption of at least 64 ounces.  Physical activity at least 180 minutes per week.  If just  starting out, start 10 minutes a day and work your way up.   This can be as simple as taking the stairs instead of the elevator and walking 2-3 laps around the office  purposefully every day.   STD protection, partner selection, and regular testing if high risk.  Limited consumption of alcoholic beverages if alcohol is consumed. For men, I recommend no more than 14 alcoholic beverages per week, spread out throughout the week (max 2 per day). Avoid "binge" drinking or consuming large quantities of alcohol in one setting.  Please let me know if you feel you may need help with reduction or quitting alcohol consumption.   Avoidance of nicotine, if used. Please let me know if you feel you may need help with reduction or quitting nicotine use.   Daily mental health attention. This can be in the form of 5 minute daily meditation, prayer, journaling, yoga, reflection, etc.  Purposeful attention to your emotions and mental state can significantly improve your overall wellbeing  and  Health.  Please know that I am here to help you with all of your health care goals and am happy to work with you to find a solution that works best for you.  The greatest advice I have received with any changes in life are to take it one step at a time, that even means if all you can focus on is the next 60 seconds, then do that and celebrate your victories.  With any changes in life, you will have set backs, and that is OK. The important thing to remember is, if you have a set back, it is not a failure, it is an opportunity to try again! Screening Testing Mammogram Every 1 -2 years based on history and risk factors Starting at age 91 Pap Smear Ages 21-39 every 3 years Ages 22-65 every 5 years with HPV testing More frequent testing may be required based on results and history Colon Cancer Screening Every 1-10 years based on test performed, risk factors, and history Starting at age 64 Bone Density Screening Every  2-10 years based on history Starting at age 44 for women Recommendations for men differ based on medication usage, history, and risk factors AAA Screening One time ultrasound Men 27-62 years old who have every smoked  Lung Cancer Screening Low Dose Lung CT every 12 months Age 66-80 years with a 30 pack-year smoking history who still smoke or who have quit within the last 15 years   Screening Labs Routine  Labs: Complete Blood Count (CBC), Complete Metabolic Panel (CMP), Cholesterol (Lipid Panel) Every 6-12 months based on history and medications May be recommended more frequently based on current conditions or previous results Hemoglobin A1c Lab Every 3-12 months based on history and previous results Starting at age 72 or earlier with diagnosis of diabetes, high cholesterol, BMI >26, and/or risk factors Frequent monitoring for patients with diabetes to ensure blood sugar control Thyroid Panel (TSH) Every 6 months based on history, symptoms, and risk factors May be repeated more often if on medication HIV One time testing for all patients 26 and older May be repeated more frequently for patients with increased risk factors or exposure Hepatitis C One time testing for all patients 2 and older May be repeated more frequently for patients with increased risk factors or exposure Gonorrhea, Chlamydia Every 12 months for all sexually active persons 13-24 years Additional monitoring may be recommended for those who are considered high risk or who have symptoms Every 12 months for any woman on birth control, regardless of sexual activity PSA Men 71-60 years old with risk factors Additional screening may be recommended from age 40-69 based on risk factors, symptoms, and history  Vaccine Recommendations Tetanus Booster All adults every 10 years Flu Vaccine All patients 6 months and older every year COVID Vaccine All patients 12 years and older Initial dosing with booster May  recommend additional booster based on age and health history HPV Vaccine 2 doses all patients age 71-26 Dosing may be considered for patients over 26 Shingles Vaccine (Shingrix) 2 doses all adults 55 years and older Pneumonia (Pneumovax 23) All adults 65 years and older May recommend earlier dosing based on health history One year apart from Prevnar 13 Pneumonia (Prevnar 39) All adults 65 years and older Dosed 1 year after Pneumovax 23 Pneumonia (Prevnar 20) One time alternative to the two dosing of 13 and 23 For all adults with initial dose of 23, 20 is recommended 1 year later For all adults with initial dose of 13, 23 is still recommended as second option 1 year later

## 2022-11-11 ENCOUNTER — Other Ambulatory Visit (HOSPITAL_BASED_OUTPATIENT_CLINIC_OR_DEPARTMENT_OTHER): Payer: Self-pay

## 2022-11-11 ENCOUNTER — Other Ambulatory Visit: Payer: Self-pay

## 2022-11-11 MED ORDER — CELECOXIB 200 MG PO CAPS
200.0000 mg | ORAL_CAPSULE | Freq: Every day | ORAL | 1 refills | Status: DC
  Filled 2022-11-11: qty 90, 90d supply, fill #0
  Filled 2023-02-28: qty 90, 90d supply, fill #1

## 2022-11-25 ENCOUNTER — Other Ambulatory Visit (HOSPITAL_BASED_OUTPATIENT_CLINIC_OR_DEPARTMENT_OTHER): Payer: Self-pay

## 2022-11-25 ENCOUNTER — Other Ambulatory Visit: Payer: Self-pay | Admitting: Nurse Practitioner

## 2022-11-25 MED ORDER — SPIRIVA RESPIMAT 2.5 MCG/ACT IN AERS
2.0000 | INHALATION_SPRAY | Freq: Every day | RESPIRATORY_TRACT | 11 refills | Status: DC
  Filled 2023-03-23: qty 4, 30d supply, fill #0
  Filled 2023-04-17 – 2023-06-23 (×2): qty 4, 30d supply, fill #1

## 2022-12-01 ENCOUNTER — Ambulatory Visit: Payer: BC Managed Care – PPO | Admitting: Family Medicine

## 2022-12-01 ENCOUNTER — Other Ambulatory Visit: Payer: Self-pay

## 2022-12-01 VITALS — BP 150/92 | HR 77 | Ht 64.25 in | Wt 192.0 lb

## 2022-12-01 DIAGNOSIS — G8929 Other chronic pain: Secondary | ICD-10-CM

## 2022-12-01 DIAGNOSIS — M25551 Pain in right hip: Secondary | ICD-10-CM

## 2022-12-01 DIAGNOSIS — M25561 Pain in right knee: Secondary | ICD-10-CM | POA: Diagnosis not present

## 2022-12-01 DIAGNOSIS — M545 Low back pain, unspecified: Secondary | ICD-10-CM | POA: Diagnosis not present

## 2022-12-01 NOTE — Progress Notes (Signed)
Rubin Payor, PhD, LAT, ATC acting as a scribe for Clementeen Graham, MD.  [name] is a 62 y.o. female who presents to [facility] today for knee, hip, and back pain. Pt was last seen by Dr. Denyse Amass on 03/21/22 for her R knee.  Today, pt reports she was doing some chair yoga and was feeling good. She was installing marble tiles on her bathroom floor in June and then a few weeks later suffered a fall, landing on both knees. Pt locates pain to the midline of her low back and deep within the R hip, anteriorly.   Dx imaging: 05/25/2020 R knee & R hip XR   Pertinent review of systems: No fevers or chills  Relevant historical information: Hypertension and diabetes.   Exam:  BP (!) 150/92   Pulse 77   Ht 5' 4.25" (1.632 m)   Wt 192 lb (87.1 kg)   SpO2 99%   BMI 32.70 kg/m  General: Well Developed, well nourished, and in no acute distress.   MSK: Right knee healing abrasion anterior knee.  Mildly tender palpation anterior knee. Normal motion. Intact strength. Stable ligamentous exam.  L-spine and right hip: Mildly tender palpation lumbar spine.  Normal lumbar motion.  Lower extremity strength is intact.  Right hip: Normal motion.  Intact strength.  Lab and Radiology Results  Procedure: Real-time Ultrasound Guided Injection of right knee joint superior lateral patella space Device: Philips Affiniti 50G/GE Logiq Images permanently stored and available for review in PACS Verbal informed consent obtained.  Discussed risks and benefits of procedure. Warned about infection, bleeding, hyperglycemia damage to structures among others. Patient expresses understanding and agreement Time-out conducted.   Noted no overlying erythema, induration, or other signs of local infection.   Skin prepped in a sterile fashion.   Local anesthesia: Topical Ethyl chloride.   With sterile technique and under real time ultrasound guidance: 40 mg of Kenalog and 2 mL of  Marcaine injected into knee joint. Fluid seen entering the joint capsule.   Completed without difficulty   Pain immediately resolved suggesting accurate placement of the medication.   Advised to call if fevers/chills, erythema, induration, drainage, or persistent bleeding.   Images permanently stored and available for review in the ultrasound unit.  Impression: Technically successful ultrasound guided injection.       Assessment and Plan: 62 y.o. female with acute exacerbation of right knee pain.  Pain thought to be due to exacerbation of DJD and contusion due to fall.  Plan for steroid injection.  Hip and low back pain exacerbation after fall and change in activity level.  She is a great candidate for physical therapy for this.  She has had PT before in [place].  Will use that location as well.   PDMP not reviewed this encounter. Orders Placed This Encounter  Procedures   [place] LIMITED JOINT SPACE STRUCTURES LOW RIGHT(NO LINKED CHARGES)    Order Specific Question:   Reason for Exam (SYMPTOM  OR DIAGNOSIS REQUIRED)    Answer:   right knee pain    Order Specific Question:   Preferred imaging location?    Answer:   [facility]   Ambulatory referral to Physical Therapy    Referral Priority:   Routine    Referral Type:   Physical Medicine    Referral Reason:   Specialty Services Required    Requested Specialty:   Physical Therapy    Number of Visits Requested:  1   No orders of the defined types were placed in this encounter.    Discussed warning signs or symptoms. Please see discharge instructions. Patient expresses understanding.   The above documentation has been reviewed and is accurate and complete Clementeen Graham, M.D.

## 2022-12-01 NOTE — Patient Instructions (Signed)
Thank you for coming in today.   You received an injection today. Seek immediate medical attention if the joint becomes red, extremely painful, or is oozing fluid.   I've referred you to Physical Therapy.  Let us know if you don't hear from them in one week.   Check back in 6 weeks

## 2022-12-05 ENCOUNTER — Other Ambulatory Visit (HOSPITAL_BASED_OUTPATIENT_CLINIC_OR_DEPARTMENT_OTHER): Payer: Self-pay

## 2022-12-06 ENCOUNTER — Ambulatory Visit: Payer: BC Managed Care – PPO | Attending: Family Medicine

## 2022-12-06 ENCOUNTER — Other Ambulatory Visit: Payer: Self-pay

## 2022-12-06 DIAGNOSIS — M5459 Other low back pain: Secondary | ICD-10-CM | POA: Diagnosis not present

## 2022-12-06 DIAGNOSIS — G8929 Other chronic pain: Secondary | ICD-10-CM | POA: Insufficient documentation

## 2022-12-06 DIAGNOSIS — M25561 Pain in right knee: Secondary | ICD-10-CM | POA: Diagnosis not present

## 2022-12-06 DIAGNOSIS — M6281 Muscle weakness (generalized): Secondary | ICD-10-CM | POA: Insufficient documentation

## 2022-12-06 DIAGNOSIS — M25551 Pain in right hip: Secondary | ICD-10-CM | POA: Diagnosis not present

## 2022-12-06 NOTE — Therapy (Signed)
OUTPATIENT PHYSICAL THERAPY EVALUATION   Patient Name: [name] MRN: [id number] DOB:1960/08/11,62 y.o., female Today's Date: 12/06/2022   END OF SESSION:  PT End of Session - 12/06/22 1521     Visit Number 1    Number of Visits 17    Date for PT Re-Evaluation 02/04/23    Authorization Type Cign[employer];BCBS    PT Start Time 1523    PT Stop Time 1606    PT Time Calculation (min) 43 min    Activity Tolerance Patient tolerated treatment well    Behavior During Therapy [facility] for tasks assessed/performed              Past Medical History:  Diagnosis Date   Abdominal bloating 11/05/2020   Abnormality of right breast on screening mammogram 10/14/2019   Needs further views of right breast.  Breast center to call.       Achilles tendon contracture, right    Acute non-recurrent frontal sinusitis 12/24/2019   Acute recurrent pansinusitis 08/04/2020   ALLERGIC RHINITIS 04/10/2008   Allergic rhinitis 04/10/2008   Qualifier: Diagnosis of  By: Amador Cunas  MD, Janett Labella    Asthma    asthmatic bronchitis   Breast lump in female    Carpal tunnel syndrome of right wrist    Cellulitis 10/31/2019   Chronic right shoulder pain 05/07/2019   DEPRESSION 08/13/2008   Diabetes mellitus without complication (HCC)    Fatigue 11/17/2015   Fatty liver disease, nonalcoholic 03/29/2013   Hepatitis    fatty liver   HYPERLIPIDEMIA 08/04/2006   HYPERTENSION 08/04/2006   HYPOTHYROIDISM 08/04/2006   Insomnia 10/31/2019   Kidney injury    LOW BACK PAIN 08/04/2006   Moderate persistent asthma with (acute) exacerbation 06/27/2019   Neuromuscular disorder (HCC)    Pain of finger of right hand 02/09/2021   Patellofemoral syndrome, right 06/19/2019   PERIMENOPAUSAL SYNDROME 05/08/2008   Plantar fasciitis of right foot 04/12/2017   Plantar fasciitis, left 11/17/2015   Postmenopausal bleeding 07/17/2019   Biopsy and [place] c/w endometrial polyp.  Pt to see Dr. Earlene Plater for surgical consult.     Primary  insomnia 10/31/2019   PVD (peripheral vascular disease) (HCC)    occlusive, status post bifem bypass 2007   SOB (shortness of breath) 07/12/2016   Vaginal candidiasis 10/31/2019   Past Surgical History:  Procedure Laterality Date   CARPAL TUNNEL RELEASE Right 04/05/2016   Procedure: OPEN RIGHT CARPAL TUNNEL RELEASE;  Surgeon: Kerrin Champagne, MD;  Location: [facility];  Service: Orthopedics;  Laterality: Right;   CHOLECYSTECTOMY     FEMORAL BYPASS     bifem.   HAGLAND'S DEFORMITY EXCISION     PLANTAR FASCIA RELEASE Right 04/18/2018   Procedure: PLANTAR FASCIA RELEASE AND GASTROCNEMIUS RECESSION RIGHT;  Surgeon: Nadara Mustard, MD;  Location: [facility] OR;  Service: Orthopedics;  Laterality: Right;   TUBAL LIGATION     Patient Active Problem List   Diagnosis Date Noted   Family history of dementia 09/13/2022   Chondromalacia of right patella 12/17/2021   Osteoarthritis of right acromioclavicular joint 03/09/2021   Subacromial impingement of right shoulder 12/31/2020   Moderate persistent asthma without complication 11/27/2020   Reflux gastritis 11/05/2020   Rosacea 08/04/2020   Situational anxiety 05/11/2020   Body mass index (BMI) of 36.0-36.9 in adult 05/11/2020   DDD (degenerative disc disease), cervical 08/30/2019   Nonalcoholic steatohepatitis (NASH) 08/08/2019   Migraine with aura and without status migrainosus, not intractable 05/31/2017  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   Diabetes 1.5, 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   Type 2 diabetes mellitus with complications (HCC) 01/14/2015   Vitamin D deficiency 01/14/2015   Peripheral vascular disease (HCC) 07/15/2011   Hypothyroidism 08/04/2006   Hyperlipidemia associated with type 2 diabetes mellitus (HCC) 08/04/2006   Hypertension complicating diabetes (HCC) 08/04/2006   Acute bilateral low back pain without sciatica 08/04/2006    PCP: Tollie Eth, NP  REFERRING PROVIDER: Rodolph Bong, MD    REFERRING DIAG:  [phone] (ICD-10-CM) - Chronic pain of right knee  M25.551,G89.29 (ICD-10-CM) - Chronic right hip pain  M54.50,G89.29 (ICD-10-CM) - Chronic midline low back pain without sciatica    Rationale for Evaluation and Treatment: Rehabilitation  THERAPY DIAG:  Chronic pain of right knee  Muscle weakness (generalized)  Pain in right hip  Other low back pain  ONSET DATE: chronic   SUBJECTIVE:                                                                                                                                                                                           SUBJECTIVE STATEMENT: Patient reports history of Rt knee pain for years from a bone spur. Her pain was worsened from 2 falls in June (see specifics below) where she fell directly on her knees. She was completing chair yoga prior to her falls in June, but has not been exercising recently. She reports the pain is intermittent in the Rt knee. She recently had a shot in the Rt knee and this has helped with her pain. The knee pain is exacerbated with transfers and with stair negotiation (step to pattern going up and side steps going down). Patient reports Rt hip pain that comes and goes that began over the summer when she was remodeling her bathroom. This pain is deep in the Rt hip and is aggravated by prolonged standing. Patient reports she has "lived with a bad back for years. I have spondylosis." She feels stiff in the back. She feels her back pain has worsened over the past few months as she has been helping to care for her 21 year old grandson more. She reports occasional numbness in the Rt foot about the dorsal aspect. No red flag symptoms.   PERTINENT HISTORY:  Asthma  Rt knee injection on 12/01/22  PAIN:  Are you having pain? Yes: NPRS scale: 4/10 Pain location: low back; Rt hip; Rt knee Pain description: stiff (back); dull,ache (Rt hip); sharp (Rt knee)  Aggravating factors: stairs, transfers,  lifting, standing/walking > 20 minutes Relieving factors: medication, hot shower; topical analgesic  PRECAUTIONS: Fall  WEIGHT BEARING RESTRICTIONS: No  FALLS:  Has patient fallen in last 6 months? Yes. Number of falls 2; foot got caught in blanket and fell from the couch; tripped going up the stairs due to dog  LIVING ENVIRONMENT: Lives with: lives with their family Lives in: House/apartment Stairs: Yes: Internal: 13 steps; none Has following equipment at home: None  OCCUPATION: work from [employer] job  PLOF: Independent  PATIENT GOALS: "get moving and make sure I keep moving at home."    OBJECTIVE:  Note: Objective measures were completed at Evaluation unless otherwise noted.  DIAGNOSTIC FINDINGS:  No recent imaging on file   PATIENT SURVEYS:  FOTO 43% function to 48% predicted   SCREENING FOR RED FLAGS: None  COGNITION: Overall cognitive status: Within functional limits for tasks assessed     SENSATION: Not tested  MUSCLE LENGTH: Not tested  POSTURE: rounded shoulders, forward head, and flat back  PALPATION: Diffuse tenderness about Rt anterior knee; TTP bilateral lumbar paraspinals   LUMBAR ROM:   AROM eval  Flexion WFL  Extension 25% limited pain   Right lateral flexion WFL pain  Left lateral flexion WFL pain  Right rotation 25% limited  Left rotation 25% limited    (Blank rows = not tested)  LOWER EXTREMITY ROM:     Active  Right eval Left eval  Hip flexion Full   Hip extension    Hip abduction Full    Hip adduction    Hip internal rotation Mild limitation   Hip external rotation Mild limitation   Knee flexion Full Full  Knee extension Full Full  Ankle dorsiflexion    Ankle plantarflexion    Ankle inversion    Ankle eversion     (Blank rows = not tested)  LOWER EXTREMITY MMT:    MMT Right eval Left eval  Hip flexion 4- 4+  Hip extension 4- 4-  Hip abduction 4- 4-  Hip adduction    Hip internal rotation    Hip external  rotation    Knee flexion 5 5  Knee extension 5 5  Ankle dorsiflexion 5 5  Ankle plantarflexion    Ankle inversion    Ankle eversion     (Blank rows = not tested)  SPECIAL TESTS:  Not tested   FUNCTIONAL TESTS:  5 x STS: 19.12 seconds increased Rt knee pain  Functional lifting: increased trunk flexion, limited hip hinge and limited knee flexion   GAIT: Distance walked: 10 ft  Assistive device utilized: None Level of assistance: Complete Independence Comments: decreased arm swing; limited trunk rotation    [facility] Adult PT Treatment:                                                DATE: 12/06/22  Therapeutic Exercise: Demonstrated and issued initial HEP.      PATIENT EDUCATION:  Education details: see treatment; POC; assessment findings  Person educated: Patient Education method: Explanation, Demonstration, Tactile cues, Verbal cues, and Handouts Education comprehension: verbalized understanding, returned demonstration, verbal cues required, tactile cues required, and needs further education  HOME EXERCISE PROGRAM: Access Code: [email or web address] URL: [email or web address] Date: 12/06/2022 Prepared by: Letitia Libra  Exercises - Supine Lower Trunk Rotation  - 1 x daily - 7 x weekly - 2 sets - 10 reps - Supine Figure 4 Piriformis Stretch  - 1 x daily - 7  x weekly - 3 sets - 30  hold - Modified Thomas Stretch  - 1 x daily - 7 x weekly - 3 sets - 30 sec  hold - Supine Bridge  - 1 x daily - 7 x weekly - 2 sets - 10 reps - Hooklying Clamshell with Resistance  - 1 x daily - 7 x weekly - 2 sets - 10 reps  ASSESSMENT:  CLINICAL IMPRESSION: Patient is a 62 y.o. female who was seen today for physical therapy evaluation and treatment for chronic low back, Rt knee, and Rt hip pain that were exacerabated from 2 falls this summer and having a more active role in caring for her 9 year old grandson. Upon assessment she is noted to have limited trunk extension and lateral flexion  AROM, bilateral hip weakness, aberrant lifting mechanics, and gait/postural abnormalities. She will benefit from skilled PT to address the above stated deficits in order to optimize her function and assist in overall pain reduction.   OBJECTIVE IMPAIRMENTS: Abnormal gait, decreased activity tolerance, decreased endurance, decreased knowledge of condition, decreased mobility, difficulty walking, decreased ROM, decreased strength, increased fascial restrictions, impaired flexibility, improper body mechanics, postural dysfunction, and pain.   ACTIVITY LIMITATIONS: carrying, lifting, bending, standing, squatting, stairs, transfers, locomotion level, and caring for others  PARTICIPATION LIMITATIONS: meal prep, cleaning, laundry, community activity, and yard work  PERSONAL FACTORS: Age, Fitness, Time since onset of injury/illness/exacerbation, and 3+ comorbidities: see PMH above  are also affecting patient's functional outcome.   REHAB POTENTIAL: Good  CLINICAL DECISION MAKING: Evolving/moderate complexity  EVALUATION COMPLEXITY: Moderate   GOALS: Goals reviewed with patient? Yes  SHORT TERM GOALS: Target date: 01/17/2023    Patient will be independent and compliant with initial HEP.   Baseline: see above Goal status: INITIAL  2.  Patient will demonstrate proper lifting mechanics.  Baseline: see above  Goal status: INITIAL  3.  Patient will complete 5 x STS in </= 14 seconds to improve her functional strength.  Baseline: see above  Goal status: INITIAL   LONG TERM GOALS: Target date: 02/04/23  Patient will score at least 48% on FOTO to signify clinically meaningful improvement in functional abilities.   Baseline: see above Goal status: INITIAL  2.  Patient will negotiate stairs with reciprocal pattern.  Baseline: step to and side stepping  Goal status: INITIAL  3.  Patient will be able to lift at least 15 lbs with proper form without an increase in pain.  Baseline: see  above Goal status: INITIAL  4.  Patient will self-report tolerating at least 30 minutes of standing/walking activity.  Baseline: see above Goal status: INITIAL  5.  Patient will be independent with advanced home program to assist in maintenance of her chronic condition.  Baseline: initial HEP issued  Goal status: INITIAL  6.  Patient will demonstrate 5/5 bilateral hip strength to improve stability about the chain with prolonged walking/standing activity.  Baseline:  Goal status: INITIAL  PLAN:  PT FREQUENCY: 2x/week  PT DURATION: 8 weeks  PLANNED INTERVENTIONS: 97164- PT Re-evaluation, 97110-Therapeutic exercises, 97530- Therapeutic activity, [id number]- Neuromuscular re-education, 97535- Self Care, [id number]- Manual therapy, [id number]- Gait training, [phone]- Aquatic Therapy, 97014- Electrical stimulation (unattended), [id number]- Electrical stimulation (manual), [id number]- Vasopneumatic device, [id number]- Traction (mechanical), [id number]- Ionotophoresis 4mg /ml Dexamethasone, Patient/Family education, Balance training, Taping, Dry Needling, Joint mobilization, Cryotherapy, and Moist heat.  PLAN FOR NEXT SESSION: review and progress HEP prn; generalized LE strengthening; core strengthening; quad stretching    Joydan Gretzinger, PT, DPT, ATC  12/06/22 4:56 PM

## 2022-12-07 ENCOUNTER — Other Ambulatory Visit (HOSPITAL_BASED_OUTPATIENT_CLINIC_OR_DEPARTMENT_OTHER): Payer: Self-pay

## 2022-12-08 ENCOUNTER — Other Ambulatory Visit (HOSPITAL_BASED_OUTPATIENT_CLINIC_OR_DEPARTMENT_OTHER): Payer: Self-pay

## 2022-12-08 ENCOUNTER — Ambulatory Visit: Payer: BC Managed Care – PPO

## 2022-12-08 DIAGNOSIS — M25551 Pain in right hip: Secondary | ICD-10-CM | POA: Diagnosis not present

## 2022-12-08 DIAGNOSIS — M6281 Muscle weakness (generalized): Secondary | ICD-10-CM

## 2022-12-08 DIAGNOSIS — M5459 Other low back pain: Secondary | ICD-10-CM

## 2022-12-08 DIAGNOSIS — G8929 Other chronic pain: Secondary | ICD-10-CM | POA: Diagnosis not present

## 2022-12-08 DIAGNOSIS — M25561 Pain in right knee: Secondary | ICD-10-CM | POA: Diagnosis not present

## 2022-12-08 NOTE — Therapy (Signed)
OUTPATIENT PHYSICAL THERAPY TREATMENT   Patient Name: [name] MRN: [id number] DOB:[birth year],62 y.o., female Today's Date: 12/08/2022   END OF SESSION:  PT End of Session - 12/08/22 0930     Visit Number 2    Number of Visits 17    Date for PT Re-Evaluation 02/04/23    Authorization Type [employer];BC[employer]    PT Start Time 0930    PT Stop Time 1010    PT Time Calculation (min) 40 min    Activity Tolerance Patient tolerated treatment well    Behavior During Therapy [name] for tasks assessed/performed            Past Medical History:  Diagnosis Date   Abdominal bloating 11/05/2020   Abnormality of right breast on screening mammogram 10/14/2019   Needs further views of right breast.  Breast center to call.       Achilles tendon contracture, right    Acute non-recurrent frontal sinusitis 12/24/2019   Acute recurrent pansinusitis 08/04/2020   ALLERGIC RHINITIS 04/10/2008   Allergic rhinitis 04/10/2008   Qualifier: Diagnosis of  By: Amador Cunas  MD, Janett Labella    Asthma    asthmatic bronchitis   Breast lump in female    Carpal tunnel syndrome of right wrist    Cellulitis 10/31/2019   Chronic right shoulder pain 05/07/2019   DEPRESSION 08/13/2008   Diabetes mellitus without complication (HCC)    Fatigue 11/17/2015   Fatty liver disease, nonalcoholic 03/29/2013   Hepatitis    fatty liver   HYPERLIPIDEMIA 08/04/2006   HYPERTENSION 08/04/2006   HYPOTHYROIDISM 08/04/2006   Insomnia 10/31/2019   Kidney injury    LOW BACK PAIN 08/04/2006   Moderate persistent asthma with (acute) exacerbation 06/27/2019   Neuromuscular disorder (HCC)    Pain of finger of right hand 02/09/2021   Patellofemoral syndrome, right 06/19/2019   PERIMENOPAUSAL SYNDROME 05/08/2008   Plantar fasciitis of right foot 04/12/2017   Plantar fasciitis, left 11/17/2015   Postmenopausal bleeding 07/17/2019   Biopsy and [place] c/w endometrial polyp.  Pt to see Dr. Earlene Plater for surgical consult.     Primary insomnia  10/31/2019   PVD (peripheral vascular disease) (HCC)    occlusive, status post bifem bypass 2007   SOB (shortness of breath) 07/12/2016   Vaginal candidiasis 10/31/2019   Past Surgical History:  Procedure Laterality Date   CARPAL TUNNEL RELEASE Right 04/05/2016   Procedure: OPEN RIGHT CARPAL TUNNEL RELEASE;  Surgeon: Kerrin Champagne, MD;  Location:  SURGERY CENTER;  Service: Orthopedics;  Laterality: Right;   CHOLECYSTECTOMY     FEMORAL BYPASS     bifem.   HAGLAND'S DEFORMITY EXCISION     PLANTAR FASCIA RELEASE Right 04/18/2018   Procedure: PLANTAR FASCIA RELEASE AND GASTROCNEMIUS RECESSION RIGHT;  Surgeon: Nadara Mustard, MD;  Location: [facility];  Service: Orthopedics;  Laterality: Right;   TUBAL LIGATION     Patient Active Problem List   Diagnosis Date Noted   Family history of dementia 09/13/2022   Chondromalacia of right patella 12/17/2021   Osteoarthritis of right acromioclavicular joint 03/09/2021   Subacromial impingement of right shoulder 12/31/2020   Moderate persistent asthma without complication 11/27/2020   Reflux gastritis 11/05/2020   Rosacea 08/04/2020   Situational anxiety 05/11/2020   Body mass index (BMI) of 36.0-36.9 in adult 05/11/2020   DDD (degenerative disc disease), cervical 08/30/2019   Nonalcoholic steatohepatitis (NASH) 08/08/2019   Migraine with aura and without status migrainosus, not intractable 05/31/2017   S/P aorto-bifemoral  bypass surgery 02/12/2015   Diabetes 1.5, 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   Type 2 diabetes mellitus with complications (HCC) 01/14/2015   Vitamin D deficiency 01/14/2015   Peripheral vascular disease (HCC) 07/15/2011   Hypothyroidism 08/04/2006   Hyperlipidemia associated with type 2 diabetes mellitus (HCC) 08/04/2006   Hypertension complicating diabetes (HCC) 08/04/2006   Acute bilateral low back pain without sciatica 08/04/2006    PCP: Tollie Eth, NP  REFERRING PROVIDER: Rodolph Bong, MD   REFERRING DIAG:   [phone] (ICD-10-CM) - Chronic pain of right knee  M25.551,G89.29 (ICD-10-CM) - Chronic right hip pain  M54.50,G89.29 (ICD-10-CM) - Chronic midline low back pain without sciatica    Rationale for Evaluation and Treatment: Rehabilitation  THERAPY DIAG:  Chronic pain of right knee  Muscle weakness (generalized)  Pain in right hip  Other low back pain  ONSET DATE: chronic   SUBJECTIVE:                                                                                                                                                                                           SUBJECTIVE STATEMENT: Patient reports she is able to navigate stairs with reciprocal stepping, however it is painful. Patient states she has no knee pain currently but it is sore in R knee from where her son's dog ran into her this morning. Patient states her hip pain "comes and goes".  EVAL: Patient reports history of Rt knee pain for years from a bone spur. Her pain was worsened from 2 falls in June (see specifics below) where she fell directly on her knees. She was completing chair yoga prior to her falls in June, but has not been exercising recently. She reports the pain is intermittent in the Rt knee. She recently had a shot in the Rt knee and this has helped with her pain. The knee pain is exacerbated with transfers and with stair negotiation (step to pattern going up and side steps going down). Patient reports Rt hip pain that comes and goes that began over the summer when she was remodeling her bathroom. This pain is deep in the Rt hip and is aggravated by prolonged standing. Patient reports she has "lived with a bad back for years. I have spondylosis." She feels stiff in the back. She feels her back pain has worsened over the past few months as she has been helping to care for her 33 year old grandson more. She reports occasional numbness in the Rt foot about the dorsal aspect. No red flag symptoms.   PERTINENT  HISTORY:  Asthma  Rt knee injection on 12/01/22  PAIN:  Are you having pain? Yes: NPRS scale: 4/10 Pain location: low back; Rt hip; Rt knee Pain description: stiff (back); dull,ache (Rt hip); sharp (Rt knee)  Aggravating factors: stairs, transfers, lifting, standing/walking > 20 minutes Relieving factors: medication, hot shower; topical analgesic  PRECAUTIONS: Fall  WEIGHT BEARING RESTRICTIONS: No  FALLS:  Has patient fallen in last 6 months? Yes. Number of falls 2; foot got caught in blanket and fell from the couch; tripped going up the stairs due to dog  LIVING ENVIRONMENT: Lives with: lives with their family Lives in: House/apartment Stairs: Yes: Internal: 13 steps; none Has following equipment at home: None  OCCUPATION: work from [employer] job  PLOF: Independent  PATIENT GOALS: "get moving and make sure I keep moving at home."    OBJECTIVE:  Note: Objective measures were completed at Evaluation unless otherwise noted.  DIAGNOSTIC FINDINGS:  No recent imaging on file   PATIENT SURVEYS:  FOTO 43% function to 48% predicted   SCREENING FOR RED FLAGS: None  COGNITION: Overall cognitive status: Within functional limits for tasks assessed     SENSATION: Not tested  MUSCLE LENGTH: Not tested  POSTURE: rounded shoulders, forward head, and flat back  PALPATION: Diffuse tenderness about Rt anterior knee; TTP bilateral lumbar paraspinals   LUMBAR ROM:   AROM eval  Flexion WFL  Extension 25% limited pain   Right lateral flexion WFL pain  Left lateral flexion WFL pain  Right rotation 25% limited  Left rotation 25% limited    (Blank rows = not tested)  LOWER EXTREMITY ROM:     Active  Right eval Left eval  Hip flexion Full   Hip extension    Hip abduction Full    Hip adduction    Hip internal rotation Mild limitation   Hip external rotation Mild limitation   Knee flexion Full Full  Knee extension Full Full  Ankle dorsiflexion    Ankle  plantarflexion    Ankle inversion    Ankle eversion     (Blank rows = not tested)  LOWER EXTREMITY MMT:    MMT Right eval Left eval  Hip flexion 4- 4+  Hip extension 4- 4-  Hip abduction 4- 4-  Hip adduction    Hip internal rotation    Hip external rotation    Knee flexion 5 5  Knee extension 5 5  Ankle dorsiflexion 5 5  Ankle plantarflexion    Ankle inversion    Ankle eversion     (Blank rows = not tested)  SPECIAL TESTS:  Not tested   FUNCTIONAL TESTS:  5 x STS: 19.12 seconds increased Rt knee pain  Functional lifting: increased trunk flexion, limited hip hinge and limited knee flexion   GAIT: Distance walked: 10 ft  Assistive device utilized: None Level of assistance: Complete Independence Comments: decreased arm swing; limited trunk rotation     [facility] Adult PT Treatment:                                                DATE: 12/08/2022 Therapeutic Exercise: Recumbent bike L2-3 x 5 min Figure 4 LTR x10 (B) Supine piriformis stretch x30" --> travell piriformis stretch x30" Supine HS stretch w/strap x30" Hooklying hip add isometric (ball squeeze) 10x5" Bridges + ball squeeze x10 --> bridge x10 S/L clamshell (R) + GTB x15 S/L bent knee hip abd + GTB  x15 Prone:  Press up 10x3" Bent knee hip extension x10 (B) Hip IR/ER AROM (wiper legs) Seated R knee LAQ + hip add iso ball squeeze x15 Seated trunk lateral stretch with orange PB roll out (arm on ball, opp hand behind head)    [facility] Adult PT Treatment:                                                DATE: 12/06/22  Therapeutic Exercise: Demonstrated and issued initial HEP.     PATIENT EDUCATION:  Education details: see treatment; POC; assessment findings  Person educated: Patient Education method: Explanation, Demonstration, Tactile cues, Verbal cues, and Handouts Education comprehension: verbalized understanding, returned demonstration, verbal cues required, tactile cues required, and needs further  education  HOME EXERCISE PROGRAM: Access Code: [email or web address] URL: [email or web address] Date: 12/06/2022 Prepared by: Letitia Libra  Exercises - Supine Lower Trunk Rotation  - 1 x daily - 7 x weekly - 2 sets - 10 reps - Supine Figure 4 Piriformis Stretch  - 1 x daily - 7 x weekly - 3 sets - 30  hold - Modified Thomas Stretch  - 1 x daily - 7 x weekly - 3 sets - 30 sec  hold - Supine Bridge  - 1 x daily - 7 x weekly - 2 sets - 10 reps - Hooklying Clamshell with Resistance  - 1 x daily - 7 x weekly - 2 sets - 10 reps  ASSESSMENT:  CLINICAL IMPRESSION: Hip and quad strengthening progressed with added resistance. Trunk mobility addressed with prone press up and seated lateral trunk stretches. Cueing provided to improve postural alignment and core activation without glute compensation.  EVAL: Patient is a 62 y.o. female who was seen today for physical therapy evaluation and treatment for chronic low back, Rt knee, and Rt hip pain that were exacerabated from 2 falls this summer and having a more active role in caring for her 59 year old grandson. Upon assessment she is noted to have limited trunk extension and lateral flexion AROM, bilateral hip weakness, aberrant lifting mechanics, and gait/postural abnormalities. She will benefit from skilled PT to address the above stated deficits in order to optimize her function and assist in overall pain reduction.   OBJECTIVE IMPAIRMENTS: Abnormal gait, decreased activity tolerance, decreased endurance, decreased knowledge of condition, decreased mobility, difficulty walking, decreased ROM, decreased strength, increased fascial restrictions, impaired flexibility, improper body mechanics, postural dysfunction, and pain.   ACTIVITY LIMITATIONS: carrying, lifting, bending, standing, squatting, stairs, transfers, locomotion level, and caring for others  PARTICIPATION LIMITATIONS: meal prep, cleaning, laundry, community activity, and yard  work  PERSONAL FACTORS: Age, Fitness, Time since onset of injury/illness/exacerbation, and 3+ comorbidities: see PMH above  are also affecting patient's functional outcome.   REHAB POTENTIAL: Good  CLINICAL DECISION MAKING: Evolving/moderate complexity  EVALUATION COMPLEXITY: Moderate   GOALS: Goals reviewed with patient? Yes  SHORT TERM GOALS: Target date: 01/17/2023  Patient will be independent and compliant with initial HEP.   Baseline: see above Goal status: INITIAL  2.  Patient will demonstrate proper lifting mechanics.  Baseline: see above  Goal status: INITIAL  3.  Patient will complete 5 x STS in </= 14 seconds to improve her functional strength.  Baseline: see above  Goal status: INITIAL   LONG TERM GOALS: Target date: 02/04/23  Patient will score at least 48% on  FOTO to signify clinically meaningful improvement in functional abilities.   Baseline: see above Goal status: INITIAL  2.  Patient will negotiate stairs with reciprocal pattern.  Baseline: step to and side stepping  Goal status: INITIAL  3.  Patient will be able to lift at least 15 lbs with proper form without an increase in pain.  Baseline: see above Goal status: INITIAL  4.  Patient will self-report tolerating at least 30 minutes of standing/walking activity.  Baseline: see above Goal status: INITIAL  5.  Patient will be independent with advanced home program to assist in maintenance of her chronic condition.  Baseline: initial HEP issued  Goal status: INITIAL  6.  Patient will demonstrate 5/5 bilateral hip strength to improve stability about the chain with prolonged walking/standing activity.  Baseline:  Goal status: INITIAL  PLAN:  PT FREQUENCY: 2x/week  PT DURATION: 8 weeks  PLANNED INTERVENTIONS: 97164- PT Re-evaluation, 97110-Therapeutic exercises, 97530- Therapeutic activity, [id number]- Neuromuscular re-education, 97535- Self Care, 78295- Manual therapy, [id number]- Gait training, [phone]-  Aquatic Therapy, 97014- Electrical stimulation (unattended), [id number]- Electrical stimulation (manual), [id number]- Vasopneumatic device, [id number]- Traction (mechanical), [id number]- Ionotophoresis 4mg /ml Dexamethasone, Patient/Family education, Balance training, Taping, Dry Needling, Joint mobilization, Cryotherapy, and Moist heat.  PLAN FOR NEXT SESSION: Update HEP next visit; generalized LE strengthening; core strengthening; quad stretching    Carlynn Herald, PTA 12/08/22 10:11 AM

## 2022-12-09 ENCOUNTER — Other Ambulatory Visit (HOSPITAL_BASED_OUTPATIENT_CLINIC_OR_DEPARTMENT_OTHER): Payer: Self-pay

## 2022-12-12 ENCOUNTER — Ambulatory Visit: Payer: BC Managed Care – PPO

## 2022-12-12 DIAGNOSIS — G8929 Other chronic pain: Secondary | ICD-10-CM | POA: Diagnosis not present

## 2022-12-12 DIAGNOSIS — M25551 Pain in right hip: Secondary | ICD-10-CM

## 2022-12-12 DIAGNOSIS — M6281 Muscle weakness (generalized): Secondary | ICD-10-CM

## 2022-12-12 DIAGNOSIS — M5459 Other low back pain: Secondary | ICD-10-CM | POA: Diagnosis not present

## 2022-12-12 DIAGNOSIS — M25561 Pain in right knee: Secondary | ICD-10-CM | POA: Diagnosis not present

## 2022-12-12 NOTE — Therapy (Signed)
OUTPATIENT PHYSICAL THERAPY TREATMENT   Patient Name: [name] MRN: [id number] DOB:1960-12-28,62 y.o., female Today's Date: 12/12/2022   END OF SESSION:  PT End of Session - 12/12/22 1533     Visit Number 3    Number of Visits 17    Date for PT Re-Evaluation 02/04/23    Authorization Type [employer];BCBS    PT Start Time 1534    PT Stop Time 1618    PT Time Calculation (min) 44 min    Activity Tolerance Patient tolerated treatment well    Behavior During Therapy [facility] for tasks assessed/performed            Past Medical History:  Diagnosis Date   Abdominal bloating 11/05/2020   Abnormality of right breast on screening mammogram 10/14/2019   Needs further views of right breast.  Breast center to call.       Achilles tendon contracture, right    Acute non-recurrent frontal sinusitis 12/24/2019   Acute recurrent pansinusitis 08/04/2020   ALLERGIC RHINITIS 04/10/2008   Allergic rhinitis 04/10/2008   Qualifier: Diagnosis of  By: Amador Cunas  MD, Janett Labella    Asthma    asthmatic bronchitis   Breast lump in female    Carpal tunnel syndrome of right wrist    Cellulitis 10/31/2019   Chronic right shoulder pain 05/07/2019   DEPRESSION 08/13/2008   Diabetes mellitus without complication (HCC)    Fatigue 11/17/2015   Fatty liver disease, nonalcoholic 03/29/2013   Hepatitis    fatty liver   HYPERLIPIDEMIA 08/04/2006   HYPERTENSION 08/04/2006   HYPOTHYROIDISM 08/04/2006   Insomnia 10/31/2019   Kidney injury    LOW BACK PAIN 08/04/2006   Moderate persistent asthma with (acute) exacerbation 06/27/2019   Neuromuscular disorder (HCC)    Pain of finger of right hand 02/09/2021   Patellofemoral syndrome, right 06/19/2019   PERIMENOPAUSAL SYNDROME 05/08/2008   Plantar fasciitis of right foot 04/12/2017   Plantar fasciitis, left 11/17/2015   Postmenopausal bleeding 07/17/2019   Biopsy and [place] c/w endometrial polyp.  Pt to see Dr. Earlene Plater for surgical consult.     Primary insomnia  10/31/2019   PVD (peripheral vascular disease) (HCC)    occlusive, status post bifem bypass 2007   SOB (shortness of breath) 07/12/2016   Vaginal candidiasis 10/31/2019   Past Surgical History:  Procedure Laterality Date   CARPAL TUNNEL RELEASE Right 04/05/2016   Procedure: OPEN RIGHT CARPAL TUNNEL RELEASE;  Surgeon: Kerrin Champagne, MD;  Location: [facility];  Service: Orthopedics;  Laterality: Right;   CHOLECYSTECTOMY     FEMORAL BYPASS     bifem.   HAGLAND'S DEFORMITY EXCISION     PLANTAR FASCIA RELEASE Right 04/18/2018   Procedure: PLANTAR FASCIA RELEASE AND GASTROCNEMIUS RECESSION RIGHT;  Surgeon: Nadara Mustard, MD;  Location: [facility] OR;  Service: Orthopedics;  Laterality: Right;   TUBAL LIGATION     Patient Active Problem List   Diagnosis Date Noted   Family history of dementia 09/13/2022   Chondromalacia of right patella 12/17/2021   Osteoarthritis of right acromioclavicular joint 03/09/2021   Subacromial impingement of right shoulder 12/31/2020   Moderate persistent asthma without complication 11/27/2020   Reflux gastritis 11/05/2020   Rosacea 08/04/2020   Situational anxiety 05/11/2020   Body mass index (BMI) of 36.0-36.9 in adult 05/11/2020   DDD (degenerative disc disease), cervical 08/30/2019   Nonalcoholic steatohepatitis (NASH) 08/08/2019   Migraine with aura and without status migrainosus, not intractable 05/31/2017   S/P aorto-bifemoral  bypass surgery 02/12/2015   Diabetes 1.5, 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   Type 2 diabetes mellitus with complications (HCC) 01/14/2015   Vitamin D deficiency 01/14/2015   Peripheral vascular disease (HCC) 07/15/2011   Hypothyroidism 08/04/2006   Hyperlipidemia associated with type 2 diabetes mellitus (HCC) 08/04/2006   Hypertension complicating diabetes (HCC) 08/04/2006   Acute bilateral low back pain without sciatica 08/04/2006    PCP: Tollie Eth, NP  REFERRING PROVIDER: Rodolph Bong, MD   REFERRING DIAG:   [phone] (ICD-10-CM) - Chronic pain of right knee  M25.551,G89.29 (ICD-10-CM) - Chronic right hip pain  M54.50,G89.29 (ICD-10-CM) - Chronic midline low back pain without sciatica    Rationale for Evaluation and Treatment: Rehabilitation  THERAPY DIAG:  Chronic pain of right knee  Muscle weakness (generalized)  Other low back pain  Pain in right hip  ONSET DATE: chronic   SUBJECTIVE:                                                                                                                                                                                           SUBJECTIVE STATEMENT: Patient reports her R hip is sore today, states her back and knee feeling okay today.  EVAL: Patient reports history of Rt knee pain for years from a bone spur. Her pain was worsened from 2 falls in June (see specifics below) where she fell directly on her knees. She was completing chair yoga prior to her falls in June, but has not been exercising recently. She reports the pain is intermittent in the Rt knee. She recently had a shot in the Rt knee and this has helped with her pain. The knee pain is exacerbated with transfers and with stair negotiation (step to pattern going up and side steps going down). Patient reports Rt hip pain that comes and goes that began over the summer when she was remodeling her bathroom. This pain is deep in the Rt hip and is aggravated by prolonged standing. Patient reports she has "lived with a bad back for years. I have spondylosis." She feels stiff in the back. She feels her back pain has worsened over the past few months as she has been helping to care for her 78 year old grandson more. She reports occasional numbness in the Rt foot about the dorsal aspect. No red flag symptoms.   PERTINENT HISTORY:  Asthma  Rt knee injection on 12/01/22  PAIN:  Are you having pain? Yes: NPRS scale: 4/10 Pain location: low back; Rt hip; Rt knee Pain description: stiff (back);  dull,ache (Rt hip); sharp (Rt knee)  Aggravating factors: stairs, transfers,  lifting, standing/walking > 20 minutes Relieving factors: medication, hot shower; topical analgesic  PRECAUTIONS: Fall  WEIGHT BEARING RESTRICTIONS: No  FALLS:  Has patient fallen in last 6 months? Yes. Number of falls 2; foot got caught in blanket and fell from the couch; tripped going up the stairs due to dog  LIVING ENVIRONMENT: Lives with: lives with their family Lives in: House/apartment Stairs: Yes: Internal: 13 steps; none Has following equipment at home: None  OCCUPATION: work from [employer] job  PLOF: Independent  PATIENT GOALS: "get moving and make sure I keep moving at home."    OBJECTIVE:  Note: Objective measures were completed at Evaluation unless otherwise noted.  DIAGNOSTIC FINDINGS:  No recent imaging on file   PATIENT SURVEYS:  FOTO 43% function to 48% predicted   SCREENING FOR RED FLAGS: None  COGNITION: Overall cognitive status: Within functional limits for tasks assessed     SENSATION: Not tested  MUSCLE LENGTH: Not tested  POSTURE: rounded shoulders, forward head, and flat back  PALPATION: Diffuse tenderness about Rt anterior knee; TTP bilateral lumbar paraspinals   LUMBAR ROM:   AROM eval  Flexion WFL  Extension 25% limited pain   Right lateral flexion WFL pain  Left lateral flexion WFL pain  Right rotation 25% limited  Left rotation 25% limited    (Blank rows = not tested)  LOWER EXTREMITY ROM:     Active  Right eval Left eval  Hip flexion Full   Hip extension    Hip abduction Full    Hip adduction    Hip internal rotation Mild limitation   Hip external rotation Mild limitation   Knee flexion Full Full  Knee extension Full Full  Ankle dorsiflexion    Ankle plantarflexion    Ankle inversion    Ankle eversion     (Blank rows = not tested)  LOWER EXTREMITY MMT:    MMT Right eval Left eval  Hip flexion 4- 4+  Hip extension 4- 4-  Hip  abduction 4- 4-  Hip adduction    Hip internal rotation    Hip external rotation    Knee flexion 5 5  Knee extension 5 5  Ankle dorsiflexion 5 5  Ankle plantarflexion    Ankle inversion    Ankle eversion     (Blank rows = not tested)  SPECIAL TESTS:  Not tested   FUNCTIONAL TESTS:  5 x STS: 19.12 seconds increased Rt knee pain  Functional lifting: increased trunk flexion, limited hip hinge and limited knee flexion   GAIT: Distance walked: 10 ft  Assistive device utilized: None Level of assistance: Complete Independence Comments: decreased arm swing; limited trunk rotation     [facility] Adult PT Treatment:                                                DATE: 12/12/2022 Therapeutic Exercise: Recumbent bike L2 x Figure 4 LTR x10 (B) Travell piriformis stretch w/strap 2x30" HS/ITB stretches w/strap x30" each Hooklying hip add ball squeeze 6x5" Bridge + ball squeeze x10  Hooklying bent knee fall out + GTB x10 each leg Bridge + clamshell GTB x10 Modified dead bug with orange PB (feet on mat) --> isometric dead bug hold + orange PB 3x10" Prone: Press up 10x3" Hip extension (straight leg) x10  Quad stretch w/strap 2x30" (B) Seated green PB roll  out front & diagonals x5 each    [facility] Adult PT Treatment:                                                DATE: 12/08/2022 Therapeutic Exercise: Recumbent bike L2-3 x 5 min Figure 4 LTR x10 (B) Supine piriformis stretch x30" --> travell piriformis stretch x30" Supine HS stretch w/strap x30" Hooklying hip add isometric (ball squeeze) 10x5" Bridges + ball squeeze x10 --> bridge x10 S/L clamshell (R) + GTB x15 S/L bent knee hip abd + GTB x15 Prone:  Press up 10x3" Bent knee hip extension x10 (B) Hip IR/ER AROM (wiper legs) Seated R knee LAQ + hip add iso ball squeeze x15 Seated trunk lateral stretch with orange PB roll out (arm on ball, opp hand behind head)    PATIENT EDUCATION:  Education details: see treatment; POC;  assessment findings  Person educated: Patient Education method: Explanation, Demonstration, Tactile cues, Verbal cues, and Handouts Education comprehension: verbalized understanding, returned demonstration, verbal cues required, tactile cues required, and needs further education  HOME EXERCISE PROGRAM: Access Code: [email or web address] URL: [email or web address] Date: 12/12/2022 Prepared by: Carlynn Herald  Exercises - Supine Lower Trunk Rotation  - 1 x daily - 7 x weekly - 2 sets - 10 reps - Supine Figure 4 Piriformis Stretch  - 1 x daily - 7 x weekly - 3 sets - 30  hold - Modified Thomas Stretch  - 1 x daily - 7 x weekly - 3 sets - 30 sec  hold - Supine Bridge  - 1 x daily - 7 x weekly - 2 sets - 10 reps - Isometric Dead Bug  - 1 x daily - 7 x weekly - 3 sets - 10 reps - Supine Dead Bug with Leg Extension  - 1 x daily - 7 x weekly - 3 sets - 10 reps - Prone Press Up  - 1 x daily - 7 x weekly - 3 sets - 10 reps - 3 sec hold - Prone Hip Extension  - 1 x daily - 7 x weekly - 3 sets - 10 reps - Prone Quadriceps Stretch with Strap  - 1 x daily - 7 x weekly - 3 sets - 10 reps - Clam with Resistance  - 1 x daily - 7 x weekly - 3 sets - 10 reps  ASSESSMENT:  CLINICAL IMPRESSION: Core strengthening progressed with dead bug variations; physioball added to challenge postural and core stability with isometric hold. Lumbar mobility continued with prone press up and seated trunk flexion stretch variations. HEP updated to progress LE and core strengthening.  EVAL: Patient is a 62 y.o. female who was seen today for physical therapy evaluation and treatment for chronic low back, Rt knee, and Rt hip pain that were exacerabated from 2 falls this summer and having a more active role in caring for her 10 year old grandson. Upon assessment she is noted to have limited trunk extension and lateral flexion AROM, bilateral hip weakness, aberrant lifting mechanics, and gait/postural abnormalities. She will  benefit from skilled PT to address the above stated deficits in order to optimize her function and assist in overall pain reduction.   OBJECTIVE IMPAIRMENTS: Abnormal gait, decreased activity tolerance, decreased endurance, decreased knowledge of condition, decreased mobility, difficulty walking, decreased ROM, decreased strength, increased fascial restrictions, impaired flexibility, improper body mechanics, postural  dysfunction, and pain.   ACTIVITY LIMITATIONS: carrying, lifting, bending, standing, squatting, stairs, transfers, locomotion level, and caring for others  PARTICIPATION LIMITATIONS: meal prep, cleaning, laundry, community activity, and yard work  PERSONAL FACTORS: Age, Fitness, Time since onset of injury/illness/exacerbation, and 3+ comorbidities: see PMH above  are also affecting patient's functional outcome.   REHAB POTENTIAL: Good  CLINICAL DECISION MAKING: Evolving/moderate complexity  EVALUATION COMPLEXITY: Moderate   GOALS: Goals reviewed with patient? Yes  SHORT TERM GOALS: Target date: 01/17/2023  Patient will be independent and compliant with initial HEP.   Baseline: see above Goal status: INITIAL  2.  Patient will demonstrate proper lifting mechanics.  Baseline: see above  Goal status: INITIAL  3.  Patient will complete 5 x STS in </= 14 seconds to improve her functional strength.  Baseline: see above  Goal status: INITIAL   LONG TERM GOALS: Target date: 02/04/23  Patient will score at least 48% on FOTO to signify clinically meaningful improvement in functional abilities.   Baseline: see above Goal status: INITIAL  2.  Patient will negotiate stairs with reciprocal pattern.  Baseline: step to and side stepping  Goal status: INITIAL  3.  Patient will be able to lift at least 15 lbs with proper form without an increase in pain.  Baseline: see above Goal status: INITIAL  4.  Patient will self-report tolerating at least 30 minutes of  standing/walking activity.  Baseline: see above Goal status: INITIAL  5.  Patient will be independent with advanced home program to assist in maintenance of her chronic condition.  Baseline: initial HEP issued  Goal status: INITIAL  6.  Patient will demonstrate 5/5 bilateral hip strength to improve stability about the chain with prolonged walking/standing activity.  Baseline:  Goal status: INITIAL  PLAN:  PT FREQUENCY: 2x/week  PT DURATION: 8 weeks  PLANNED INTERVENTIONS: 97164- PT Re-evaluation, 97110-Therapeutic exercises, 97530- Therapeutic activity, [id number]- Neuromuscular re-education, 97535- Self Care, [id number]- Manual therapy, [id number]- Gait training, [phone]- Aquatic Therapy, 97014- Electrical stimulation (unattended), [id number]- Electrical stimulation (manual), [id number]- Vasopneumatic device, [id number]- Traction (mechanical), [id number]- Ionotophoresis 4mg /ml Dexamethasone, Patient/Family education, Balance training, Taping, Dry Needling, Joint mobilization, Cryotherapy, and Moist heat.  PLAN FOR NEXT SESSION: Progress generalized LE strengthening; core strengthening; quad stretching    Carlynn Herald, PTA 12/12/22 4:20 PM

## 2022-12-15 ENCOUNTER — Ambulatory Visit: Payer: 59 | Admitting: Physical Therapy

## 2022-12-19 ENCOUNTER — Other Ambulatory Visit: Payer: Self-pay

## 2022-12-19 ENCOUNTER — Ambulatory Visit: Payer: BC Managed Care – PPO

## 2022-12-22 ENCOUNTER — Ambulatory Visit: Payer: BC Managed Care – PPO | Attending: Family Medicine

## 2022-12-22 DIAGNOSIS — M6281 Muscle weakness (generalized): Secondary | ICD-10-CM | POA: Diagnosis not present

## 2022-12-22 DIAGNOSIS — M25551 Pain in right hip: Secondary | ICD-10-CM | POA: Diagnosis not present

## 2022-12-22 DIAGNOSIS — M5459 Other low back pain: Secondary | ICD-10-CM | POA: Diagnosis not present

## 2022-12-22 DIAGNOSIS — G8929 Other chronic pain: Secondary | ICD-10-CM | POA: Diagnosis not present

## 2022-12-22 DIAGNOSIS — M25561 Pain in right knee: Secondary | ICD-10-CM | POA: Insufficient documentation

## 2022-12-22 NOTE — Therapy (Signed)
OUTPATIENT PHYSICAL THERAPY TREATMENT   Patient Name: [name] MRN: [id number] DOB:Dec 04, 1960,62 y.o., female Today's Date: 12/22/2022   END OF SESSION:  PT End of Session - 12/22/22 1404     Visit Number 4    Number of Visits 17    Date for PT Re-Evaluation 02/04/23    Authorization Type [employer];BC[employer]    PT Start Time 1403    PT Stop Time 1445    PT Time Calculation (min) 42 min    Activity Tolerance Patient tolerated treatment well    Behavior During Therapy [facility] for tasks assessed/performed            Past Medical History:  Diagnosis Date   Abdominal bloating 11/05/2020   Abnormality of right breast on screening mammogram 10/14/2019   Needs further views of right breast.  Breast center to call.       Achilles tendon contracture, right    Acute non-recurrent frontal sinusitis 12/24/2019   Acute recurrent pansinusitis 08/04/2020   ALLERGIC RHINITIS 04/10/2008   Allergic rhinitis 04/10/2008   Qualifier: Diagnosis of  By: Amador Cunas  MD, Janett Labella    Asthma    asthmatic bronchitis   Breast lump in female    Carpal tunnel syndrome of right wrist    Cellulitis 10/31/2019   Chronic right shoulder pain 05/07/2019   DEPRESSION 08/13/2008   Diabetes mellitus without complication (HCC)    Fatigue 11/17/2015   Fatty liver disease, nonalcoholic 03/29/2013   Hepatitis    fatty liver   HYPERLIPIDEMIA 08/04/2006   HYPERTENSION 08/04/2006   HYPOTHYROIDISM 08/04/2006   Insomnia 10/31/2019   Kidney injury    LOW BACK PAIN 08/04/2006   Moderate persistent asthma with (acute) exacerbation 06/27/2019   Neuromuscular disorder (HCC)    Pain of finger of right hand 02/09/2021   Patellofemoral syndrome, right 06/19/2019   PERIMENOPAUSAL SYNDROME 05/08/2008   Plantar fasciitis of right foot 04/12/2017   Plantar fasciitis, left 11/17/2015   Postmenopausal bleeding 07/17/2019   Biopsy and [place] c/w endometrial polyp.  Pt to see Dr. Earlene Plater for surgical consult.     Primary insomnia  10/31/2019   PVD (peripheral vascular disease) (HCC)    occlusive, status post bifem bypass 2007   SOB (shortness of breath) 07/12/2016   Vaginal candidiasis 10/31/2019   Past Surgical History:  Procedure Laterality Date   CARPAL TUNNEL RELEASE Right 04/05/2016   Procedure: OPEN RIGHT CARPAL TUNNEL RELEASE;  Surgeon: Kerrin Champagne, MD;  Location: [facility];  Service: Orthopedics;  Laterality: Right;   CHOLECYSTECTOMY     FEMORAL BYPASS     bifem.   HAGLAND'S DEFORMITY EXCISION     PLANTAR FASCIA RELEASE Right 04/18/2018   Procedure: PLANTAR FASCIA RELEASE AND GASTROCNEMIUS RECESSION RIGHT;  Surgeon: Nadara Mustard, MD;  Location: [facility] OR;  Service: Orthopedics;  Laterality: Right;   TUBAL LIGATION     Patient Active Problem List   Diagnosis Date Noted   Family history of dementia 09/13/2022   Chondromalacia of right patella 12/17/2021   Osteoarthritis of right acromioclavicular joint 03/09/2021   Subacromial impingement of right shoulder 12/31/2020   Moderate persistent asthma without complication 11/27/2020   Reflux gastritis 11/05/2020   Rosacea 08/04/2020   Situational anxiety 05/11/2020   Body mass index (BMI) of 36.0-36.9 in adult 05/11/2020   DDD (degenerative disc disease), cervical 08/30/2019   Nonalcoholic steatohepatitis (NASH) 08/08/2019   Migraine with aura and without status migrainosus, not intractable 05/31/2017   S/P aorto-bifemoral  bypass surgery 02/12/2015   Diabetes 1.5, 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   Type 2 diabetes mellitus with complications (HCC) 01/14/2015   Vitamin D deficiency 01/14/2015   Peripheral vascular disease (HCC) 07/15/2011   Hypothyroidism 08/04/2006   Hyperlipidemia associated with type 2 diabetes mellitus (HCC) 08/04/2006   Hypertension complicating diabetes (HCC) 08/04/2006   Acute bilateral low back pain without sciatica 08/04/2006    PCP: Tollie Eth, NP  REFERRING PROVIDER: Rodolph Bong, MD   REFERRING DIAG:   [phone] (ICD-10-CM) - Chronic pain of right knee  M25.551,G89.29 (ICD-10-CM) - Chronic right hip pain  M54.50,G89.29 (ICD-10-CM) - Chronic midline low back pain without sciatica    Rationale for Evaluation and Treatment: Rehabilitation  THERAPY DIAG:  Chronic pain of right knee  Muscle weakness (generalized)  Other low back pain  Pain in right hip  ONSET DATE: chronic   SUBJECTIVE:                                                                                                                                                                                           SUBJECTIVE STATEMENT: Patient reports she is feeling good today and has had minimal pain pain in R hip and knee. Patient states she is walking up stairs normal but still needs to take one step at a time on the way down.  PERTINENT HISTORY:  Asthma  Rt knee injection on 12/01/22  PAIN:  Are you having pain? Yes: NPRS scale: 4/10 Pain location: low back; Rt hip; Rt knee Pain description: stiff (back); dull,ache (Rt hip); sharp (Rt knee)  Aggravating factors: stairs, transfers, lifting, standing/walking > 20 minutes Relieving factors: medication, hot shower; topical analgesic  PRECAUTIONS: Fall  WEIGHT BEARING RESTRICTIONS: No  FALLS:  Has patient fallen in last 6 months? Yes. Number of falls 2; foot got caught in blanket and fell from the couch; tripped going up the stairs due to dog  LIVING ENVIRONMENT: Lives with: lives with their family Lives in: House/apartment Stairs: Yes: Internal: 13 steps; none Has following equipment at home: None  OCCUPATION: work from [employer] job  PLOF: Independent  PATIENT GOALS: "get moving and make sure I keep moving at home."    OBJECTIVE:  Note: Objective measures were completed at Evaluation unless otherwise noted.  DIAGNOSTIC FINDINGS:  No recent imaging on file   PATIENT SURVEYS:  FOTO 43% function to 48% predicted   SCREENING FOR RED  FLAGS: None  COGNITION: Overall cognitive status: Within functional limits for tasks assessed     SENSATION: Not tested  MUSCLE LENGTH: Not tested  POSTURE: rounded shoulders, forward head, and  flat back  PALPATION: Diffuse tenderness about Rt anterior knee; TTP bilateral lumbar paraspinals   LUMBAR ROM:   AROM eval  Flexion WFL  Extension 25% limited pain   Right lateral flexion WFL pain  Left lateral flexion WFL pain  Right rotation 25% limited  Left rotation 25% limited    (Blank rows = not tested)  LOWER EXTREMITY ROM:     Active  Right eval Left eval  Hip flexion Full   Hip extension    Hip abduction Full    Hip adduction    Hip internal rotation Mild limitation   Hip external rotation Mild limitation   Knee flexion Full Full  Knee extension Full Full  Ankle dorsiflexion    Ankle plantarflexion    Ankle inversion    Ankle eversion     (Blank rows = not tested)  LOWER EXTREMITY MMT:    MMT Right eval Left eval  Hip flexion 4- 4+  Hip extension 4- 4-  Hip abduction 4- 4-  Hip adduction    Hip internal rotation    Hip external rotation    Knee flexion 5 5  Knee extension 5 5  Ankle dorsiflexion 5 5  Ankle plantarflexion    Ankle inversion    Ankle eversion     (Blank rows = not tested)  SPECIAL TESTS:  Not tested   FUNCTIONAL TESTS:  5 x STS: 19.12 seconds increased Rt knee pain  Functional lifting: increased trunk flexion, limited hip hinge and limited knee flexion   GAIT: Distance walked: 10 ft  Assistive device utilized: None Level of assistance: Complete Independence Comments: decreased arm swing; limited trunk rotation     [facility] Adult PT Treatment:                                                DATE: 12/22/2022 Therapeutic Exercise: Recumbent bike L3 x 5 min Figure 4 LTR x10 (B) Travell piriformis stretch w/strap x30" (R) HS/ITB stretches w/strap x30" each (R) Isometric dead bug hold --> dead bug x10 Bridge + clamshell x10  green TB, x10 blue TB Prone: Press up 10x3" Quad stretch w/strap 2x30" (B) 4" step: R knee drivers Step down --> heel taps Runner's lunge --> gastroc/soleus    [facility] Adult PT Treatment:                                                DATE: 12/12/2022 Therapeutic Exercise: Recumbent bike L2 x Figure 4 LTR x10 (B) Travell piriformis stretch w/strap 2x30" HS/ITB stretches w/strap x30" each Hooklying hip add ball squeeze 6x5" Bridge + ball squeeze x10  Hooklying bent knee fall out + GTB x10 each leg Bridge + clamshell GTB x10 Modified dead bug with orange PB (feet on mat) --> isometric dead bug hold + orange PB 3x10" Prone: Press up 10x3" Hip extension (straight leg) x10  Quad stretch w/strap 2x30" (B) Seated green PB roll out front & diagonals x5 each    [facility] Adult PT Treatment:  DATE: 12/08/2022 Therapeutic Exercise: Recumbent bike L2-3 x 5 min Figure 4 LTR x10 (B) Supine piriformis stretch x30" --> travell piriformis stretch x30" Supine HS stretch w/strap x30" Hooklying hip add isometric (ball squeeze) 10x5" Bridges + ball squeeze x10 --> bridge x10 S/L clamshell (R) + GTB x15 S/L bent knee hip abd + GTB x15 Prone:  Press up 10x3" Bent knee hip extension x10 (B) Hip IR/ER AROM (wiper legs) Seated R knee LAQ + hip add iso ball squeeze x15 Seated trunk lateral stretch with orange PB roll out (arm on ball, opp hand behind head)    PATIENT EDUCATION:  Education details: see treatment; POC; assessment findings  Person educated: Patient Education method: Explanation, Demonstration, Tactile cues, Verbal cues, and Handouts Education comprehension: verbalized understanding, returned demonstration, verbal cues required, tactile cues required, and needs further education  HOME EXERCISE PROGRAM: Access Code: [email or web address] URL: [email or web address] Date: 12/12/2022 Prepared by: Carlynn Herald  Exercises - Supine  Lower Trunk Rotation  - 1 x daily - 7 x weekly - 2 sets - 10 reps - Supine Figure 4 Piriformis Stretch  - 1 x daily - 7 x weekly - 3 sets - 30  hold - Modified Thomas Stretch  - 1 x daily - 7 x weekly - 3 sets - 30 sec  hold - Supine Bridge  - 1 x daily - 7 x weekly - 2 sets - 10 reps - Isometric Dead Bug  - 1 x daily - 7 x weekly - 3 sets - 10 reps - Supine Dead Bug with Leg Extension  - 1 x daily - 7 x weekly - 3 sets - 10 reps - Prone Press Up  - 1 x daily - 7 x weekly - 3 sets - 10 reps - 3 sec hold - Prone Hip Extension  - 1 x daily - 7 x weekly - 3 sets - 10 reps - Prone Quadriceps Stretch with Strap  - 1 x daily - 7 x weekly - 3 sets - 10 reps - Clam with Resistance  - 1 x daily - 7 x weekly - 3 sets - 10 reps  ASSESSMENT:  CLINICAL IMPRESSION: Patient demonstrated improved core strength, progressing to full dead bug exercise with no low back discomfort. Knee strengthening progressed with single leg step ups and heel tap down on low step.  EVAL: Patient is a 62 y.o. female who was seen today for physical therapy evaluation and treatment for chronic low back, Rt knee, and Rt hip pain that were exacerabated from 2 falls this summer and having a more active role in caring for her 3 year old grandson. Upon assessment she is noted to have limited trunk extension and lateral flexion AROM, bilateral hip weakness, aberrant lifting mechanics, and gait/postural abnormalities. She will benefit from skilled PT to address the above stated deficits in order to optimize her function and assist in overall pain reduction.   OBJECTIVE IMPAIRMENTS: Abnormal gait, decreased activity tolerance, decreased endurance, decreased knowledge of condition, decreased mobility, difficulty walking, decreased ROM, decreased strength, increased fascial restrictions, impaired flexibility, improper body mechanics, postural dysfunction, and pain.   ACTIVITY LIMITATIONS: carrying, lifting, bending, standing, squatting, stairs,  transfers, locomotion level, and caring for others  PARTICIPATION LIMITATIONS: meal prep, cleaning, laundry, community activity, and yard work  PERSONAL FACTORS: Age, Fitness, Time since onset of injury/illness/exacerbation, and 3+ comorbidities: see PMH above  are also affecting patient's functional outcome.   REHAB POTENTIAL: Good  CLINICAL DECISION MAKING: Evolving/moderate complexity  EVALUATION  COMPLEXITY: Moderate   GOALS: Goals reviewed with patient? Yes  SHORT TERM GOALS: Target date: 01/17/2023  Patient will be independent and compliant with initial HEP.   Baseline: see above Goal status: INITIAL  2.  Patient will demonstrate proper lifting mechanics.  Baseline: see above  Goal status: INITIAL  3.  Patient will complete 5 x STS in </= 14 seconds to improve her functional strength.  Baseline: see above  Goal status: INITIAL   LONG TERM GOALS: Target date: 02/04/23  Patient will score at least 48% on FOTO to signify clinically meaningful improvement in functional abilities.   Baseline: see above Goal status: INITIAL  2.  Patient will negotiate stairs with reciprocal pattern.  Baseline: step to and side stepping  Goal status: INITIAL  3.  Patient will be able to lift at least 15 lbs with proper form without an increase in pain.  Baseline: see above Goal status: INITIAL  4.  Patient will self-report tolerating at least 30 minutes of standing/walking activity.  Baseline: see above Goal status: INITIAL  5.  Patient will be independent with advanced home program to assist in maintenance of her chronic condition.  Baseline: initial HEP issued  Goal status: INITIAL  6.  Patient will demonstrate 5/5 bilateral hip strength to improve stability about the chain with prolonged walking/standing activity.  Baseline:  Goal status: INITIAL  PLAN:  PT FREQUENCY: 2x/week  PT DURATION: 8 weeks  PLANNED INTERVENTIONS: 97164- PT Re-evaluation, 97110-Therapeutic  exercises, 97530- Therapeutic activity, [id number]- Neuromuscular re-education, 97535- Self Care, 13244- Manual therapy, [id number]- Gait training, [id number]- Aquatic Therapy, 97014- Electrical stimulation (unattended), [id number]- Electrical stimulation (manual), [id number]- Vasopneumatic device, [id number]- Traction (mechanical), [id number]- Ionotophoresis 4mg /ml Dexamethasone, Patient/Family education, Balance training, Taping, Dry Needling, Joint mobilization, Cryotherapy, and Moist heat.  PLAN FOR NEXT SESSION: Progress generalized LE strengthening; core strengthening; quad stretching   Carlynn Herald, PTA 12/22/22 2:46 PM

## 2022-12-26 ENCOUNTER — Ambulatory Visit: Payer: 59

## 2022-12-29 ENCOUNTER — Ambulatory Visit: Payer: BC Managed Care – PPO

## 2022-12-29 ENCOUNTER — Other Ambulatory Visit (HOSPITAL_BASED_OUTPATIENT_CLINIC_OR_DEPARTMENT_OTHER): Payer: Self-pay

## 2022-12-29 DIAGNOSIS — G8929 Other chronic pain: Secondary | ICD-10-CM | POA: Diagnosis not present

## 2022-12-29 DIAGNOSIS — M6281 Muscle weakness (generalized): Secondary | ICD-10-CM | POA: Diagnosis not present

## 2022-12-29 DIAGNOSIS — M25551 Pain in right hip: Secondary | ICD-10-CM | POA: Diagnosis not present

## 2022-12-29 DIAGNOSIS — M5459 Other low back pain: Secondary | ICD-10-CM | POA: Diagnosis not present

## 2022-12-29 DIAGNOSIS — M25561 Pain in right knee: Secondary | ICD-10-CM | POA: Diagnosis not present

## 2022-12-29 NOTE — Therapy (Addendum)
OUTPATIENT PHYSICAL THERAPY TREATMENT PHYSICAL THERAPY DISCHARGE SUMMARY  Visits from Start of Care: 5  Current functional level related to goals / functional outcomes: See goals below   Remaining deficits: Status unknown   Education / Equipment: N/A   Patient agrees to discharge. Patient goals were partially met. Patient is being discharged due to not returning since the last visit.   Patient Name: [name] MRN: [id number] DOB:01-10-1961,62 y.o., female Today's Date: 12/29/2022   END OF SESSION:  PT End of Session - 12/29/22 1401     Visit Number 5    Number of Visits 17    Date for PT Re-Evaluation 02/04/23    Authorization Type [employer];BC[employer]    PT Start Time 1400    PT Stop Time 1440    PT Time Calculation (min) 40 min    Activity Tolerance Patient tolerated treatment well    Behavior During Therapy [facility] for tasks assessed/performed            Past Medical History:  Diagnosis Date   Abdominal bloating 11/05/2020   Abnormality of right breast on screening mammogram 10/14/2019   Needs further views of right breast.  Breast center to call.       Achilles tendon contracture, right    Acute non-recurrent frontal sinusitis 12/24/2019   Acute recurrent pansinusitis 08/04/2020   ALLERGIC RHINITIS 04/10/2008   Allergic rhinitis 04/10/2008   Qualifier: Diagnosis of  By: Amador Cunas  MD, Janett Labella    Asthma    asthmatic bronchitis   Breast lump in female    Carpal tunnel syndrome of right wrist    Cellulitis 10/31/2019   Chronic right shoulder pain 05/07/2019   DEPRESSION 08/13/2008   Diabetes mellitus without complication (HCC)    Fatigue 11/17/2015   Fatty liver disease, nonalcoholic 03/29/2013   Hepatitis    fatty liver   HYPERLIPIDEMIA 08/04/2006   HYPERTENSION 08/04/2006   HYPOTHYROIDISM 08/04/2006   Insomnia 10/31/2019   Kidney injury    LOW BACK PAIN 08/04/2006   Moderate persistent asthma with (acute) exacerbation 06/27/2019   Neuromuscular  disorder (HCC)    Pain of finger of right hand 02/09/2021   Patellofemoral syndrome, right 06/19/2019   PERIMENOPAUSAL SYNDROME 05/08/2008   Plantar fasciitis of right foot 04/12/2017   Plantar fasciitis, left 11/17/2015   Postmenopausal bleeding 07/17/2019   Biopsy and [place] c/w endometrial polyp.  Pt to see Dr. Earlene Plater for surgical consult.     Primary insomnia 10/31/2019   PVD (peripheral vascular disease) (HCC)    occlusive, status post bifem bypass 2007   SOB (shortness of breath) 07/12/2016   Vaginal candidiasis 10/31/2019   Past Surgical History:  Procedure Laterality Date   CARPAL TUNNEL RELEASE Right 04/05/2016   Procedure: OPEN RIGHT CARPAL TUNNEL RELEASE;  Surgeon: Kerrin Champagne, MD;  Location: [facility];  Service: Orthopedics;  Laterality: Right;   CHOLECYSTECTOMY     FEMORAL BYPASS     bifem.   HAGLAND'S DEFORMITY EXCISION     PLANTAR FASCIA RELEASE Right 04/18/2018   Procedure: PLANTAR FASCIA RELEASE AND GASTROCNEMIUS RECESSION RIGHT;  Surgeon: Nadara Mustard, MD;  Location: [facility] OR;  Service: Orthopedics;  Laterality: Right;   TUBAL LIGATION     Patient Active Problem List   Diagnosis Date Noted   Family history of dementia 09/13/2022   Chondromalacia of right patella 12/17/2021   Osteoarthritis of right acromioclavicular joint 03/09/2021   Subacromial impingement of right shoulder 12/31/2020   Moderate persistent  asthma without complication 11/27/2020   Reflux gastritis 11/05/2020   Rosacea 08/04/2020   Situational anxiety 05/11/2020   Body mass index (BMI) of 36.0-36.9 in adult 05/11/2020   DDD (degenerative disc disease), cervical 08/30/2019   Nonalcoholic steatohepatitis (NASH) 08/08/2019   Migraine with aura and without status migrainosus, not intractable 05/31/2017   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   Diabetes 1.5, 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   Type 2 diabetes mellitus with complications (HCC) 01/14/2015   Vitamin D deficiency  01/14/2015   Peripheral vascular disease (HCC) 07/15/2011   Hypothyroidism 08/04/2006   Hyperlipidemia associated with type 2 diabetes mellitus (HCC) 08/04/2006   Hypertension complicating diabetes (HCC) 08/04/2006   Acute bilateral low back pain without sciatica 08/04/2006    PCP: Tollie Eth, NP  REFERRING PROVIDER: Rodolph Bong, MD   REFERRING DIAG:  [phone] (ICD-10-CM) - Chronic pain of right knee  M25.551,G89.29 (ICD-10-CM) - Chronic right hip pain  M54.50,G89.29 (ICD-10-CM) - Chronic midline low back pain without sciatica    Rationale for Evaluation and Treatment: Rehabilitation  THERAPY DIAG:  Chronic pain of right knee  Muscle weakness (generalized)  Other low back pain  Pain in right hip  ONSET DATE: chronic   SUBJECTIVE:                                                                                                                                                                                           SUBJECTIVE STATEMENT: "I was doing good, but then I cleaned my chandelier."  PERTINENT HISTORY:  Asthma  Rt knee injection on 12/01/22  PAIN:  Are you having pain? Yes: NPRS scale: 3/10 Pain location: Rt knee Pain description: sore; pulling   Aggravating factors: stairs, transfers, lifting, standing/walking > 20 minutes Relieving factors: medication, hot shower; topical analgesic  PRECAUTIONS: Fall  WEIGHT BEARING RESTRICTIONS: No  FALLS:  Has patient fallen in last 6 months? Yes. Number of falls 2; foot got caught in blanket and fell from the couch; tripped going up the stairs due to dog  LIVING ENVIRONMENT: Lives with: lives with their family Lives in: House/apartment Stairs: Yes: Internal: 13 steps; none Has following equipment at home: None  OCCUPATION: work from [employer] job  PLOF: Independent  PATIENT GOALS: "get moving and make sure I keep moving at home."    OBJECTIVE:  Note: Objective measures were completed at  Evaluation unless otherwise noted.  DIAGNOSTIC FINDINGS:  No recent imaging on file   PATIENT SURVEYS:  FOTO 43% function to 48% predicted   SCREENING FOR RED FLAGS: None  COGNITION: Overall cognitive status: Within functional limits for tasks  assessed     SENSATION: Not tested  MUSCLE LENGTH: Not tested  POSTURE: rounded shoulders, forward head, and flat back  PALPATION: Diffuse tenderness about Rt anterior knee; TTP bilateral lumbar paraspinals   LUMBAR ROM:   AROM eval  Flexion WFL  Extension 25% limited pain   Right lateral flexion WFL pain  Left lateral flexion WFL pain  Right rotation 25% limited  Left rotation 25% limited    (Blank rows = not tested)  LOWER EXTREMITY ROM:     Active  Right eval Left eval  Hip flexion Full   Hip extension    Hip abduction Full    Hip adduction    Hip internal rotation Mild limitation   Hip external rotation Mild limitation   Knee flexion Full Full  Knee extension Full Full  Ankle dorsiflexion    Ankle plantarflexion    Ankle inversion    Ankle eversion     (Blank rows = not tested)  LOWER EXTREMITY MMT:    MMT Right eval Left eval  Hip flexion 4- 4+  Hip extension 4- 4-  Hip abduction 4- 4-  Hip adduction    Hip internal rotation    Hip external rotation    Knee flexion 5 5  Knee extension 5 5  Ankle dorsiflexion 5 5  Ankle plantarflexion    Ankle inversion    Ankle eversion     (Blank rows = not tested)  SPECIAL TESTS:  Not tested   FUNCTIONAL TESTS:  5 x STS: 19.12 seconds increased Rt knee pain  Functional lifting: increased trunk flexion, limited hip hinge and limited knee flexion   12/29/22: 5 x STS: 14.45 seconds increased Rt knee pain   GAIT: Distance walked: 10 ft  Assistive device utilized: None Level of assistance: Complete Independence Comments: decreased arm swing; limited trunk rotation   [facility] Adult PT Treatment:                                                DATE:  12/29/22 Therapeutic Exercise: Recumbent bike level 2 x 5 minutes  SLR 2 x 10  Calf stretch on wedge x 1 minute Wall squats partial range 2 x 10  Eccentric leg press 2 x 10 @ 40 lbs  SL calf raise 2 x 10  Standing hip abduction 2 x 10 Standing hip extension 2 x 10  Updated HEP     [facility] Adult PT Treatment:                                                DATE: 12/22/2022 Therapeutic Exercise: Recumbent bike L3 x 5 min Figure 4 LTR x10 (B) Travell piriformis stretch w/strap x30" (R) HS/ITB stretches w/strap x30" each (R) Isometric dead bug hold --> dead bug x10 Bridge + clamshell x10 green TB, x10 blue TB Prone: Press up 10x3" Quad stretch w/strap 2x30" (B) 4" step: R knee drivers Step down --> heel taps Runner's lunge --> gastroc/soleus    [facility] Adult PT Treatment:  DATE: 12/12/2022 Therapeutic Exercise: Recumbent bike L2 x Figure 4 LTR x10 (B) Travell piriformis stretch w/strap 2x30" HS/ITB stretches w/strap x30" each Hooklying hip add ball squeeze 6x5" Bridge + ball squeeze x10  Hooklying bent knee fall out + GTB x10 each leg Bridge + clamshell GTB x10 Modified dead bug with orange PB (feet on mat) --> isometric dead bug hold + orange PB 3x10" Prone: Press up 10x3" Hip extension (straight leg) x10  Quad stretch w/strap 2x30" (B) Seated green PB roll out front & diagonals x5 each    [facility] Adult PT Treatment:                                                DATE: 12/08/2022 Therapeutic Exercise: Recumbent bike L2-3 x 5 min Figure 4 LTR x10 (B) Supine piriformis stretch x30" --> travell piriformis stretch x30" Supine HS stretch w/strap x30" Hooklying hip add isometric (ball squeeze) 10x5" Bridges + ball squeeze x10 --> bridge x10 S/L clamshell (R) + GTB x15 S/L bent knee hip abd + GTB x15 Prone:  Press up 10x3" Bent knee hip extension x10 (B) Hip IR/ER AROM (wiper legs) Seated R knee LAQ + hip add iso ball  squeeze x15 Seated trunk lateral stretch with orange PB roll out (arm on ball, opp hand behind head)    PATIENT EDUCATION:  Education details:HEP update Person educated: Patient Education method: Explanation, Demonstration, Tactile cues, Verbal cues, and Handouts Education comprehension: verbalized understanding, returned demonstration, verbal cues required, tactile cues required, and needs further education  HOME EXERCISE PROGRAM: Access Code: [email or web address] URL: [email or web address] Date: 12/29/2022 Prepared by: Letitia Libra  Exercises - Supine Lower Trunk Rotation  - 1 x daily - 7 x weekly - 2 sets - 10 reps - Supine Figure 4 Piriformis Stretch  - 1 x daily - 7 x weekly - 3 sets - 30  hold - Modified Thomas Stretch  - 1 x daily - 7 x weekly - 3 sets - 30 sec  hold - Supine Bridge  - 1 x daily - 7 x weekly - 2 sets - 10 reps - Isometric Dead Bug  - 1 x daily - 7 x weekly - 3 sets - 10 reps - Supine Dead Bug with Leg Extension  - 1 x daily - 7 x weekly - 3 sets - 10 reps - Prone Press Up  - 1 x daily - 7 x weekly - 3 sets - 10 reps - 3 sec hold - Prone Hip Extension  - 1 x daily - 7 x weekly - 3 sets - 10 reps - Prone Quadriceps Stretch with Strap  - 1 x daily - 7 x weekly - 3 sets - 10 reps - Clam with Resistance  - 1 x daily - 7 x weekly - 3 sets - 10 reps - Wall Quarter Squat  - 1 x daily - 7 x weekly - 2 sets - 10 reps - Standing Hip Abduction with Counter Support  - 1 x daily - 7 x weekly - 2 sets - 10 reps - Standing Hip Extension with Counter Support  - 1 x daily - 7 x weekly - 2 sets - 10 reps  ASSESSMENT:  CLINICAL IMPRESSION: 5 x STS has improved compared to initial evaluation having met this STG. Focused primarily on lower extremity strength progression today with good  tolerance. No signs of quad lag with SLR. With squatting activity she initially demonstrates Rt lateral trunk lean with ability to correct once cued. HEP was updated to include further  strengthening.   EVAL: Patient is a 62 y.o. female who was seen today for physical therapy evaluation and treatment for chronic low back, Rt knee, and Rt hip pain that were exacerabated from 2 falls this summer and having a more active role in caring for her 57 year old grandson. Upon assessment she is noted to have limited trunk extension and lateral flexion AROM, bilateral hip weakness, aberrant lifting mechanics, and gait/postural abnormalities. She will benefit from skilled PT to address the above stated deficits in order to optimize her function and assist in overall pain reduction.   OBJECTIVE IMPAIRMENTS: Abnormal gait, decreased activity tolerance, decreased endurance, decreased knowledge of condition, decreased mobility, difficulty walking, decreased ROM, decreased strength, increased fascial restrictions, impaired flexibility, improper body mechanics, postural dysfunction, and pain.   ACTIVITY LIMITATIONS: carrying, lifting, bending, standing, squatting, stairs, transfers, locomotion level, and caring for others  PARTICIPATION LIMITATIONS: meal prep, cleaning, laundry, community activity, and yard work  PERSONAL FACTORS: Age, Fitness, Time since onset of injury/illness/exacerbation, and 3+ comorbidities: see PMH above  are also affecting patient's functional outcome.   REHAB POTENTIAL: Good  CLINICAL DECISION MAKING: Evolving/moderate complexity  EVALUATION COMPLEXITY: Moderate   GOALS: Goals reviewed with patient? Yes  SHORT TERM GOALS: Target date: 01/17/2023  Patient will be independent and compliant with initial HEP.   Baseline: see above Goal status: INITIAL  2.  Patient will demonstrate proper lifting mechanics.  Baseline: see above  Goal status: INITIAL  3.  Patient will complete 5 x STS in </= 14 seconds to improve her functional strength.  Baseline: see above  Goal status: MET   LONG TERM GOALS: Target date: 02/04/23  Patient will score at least 48% on FOTO to  signify clinically meaningful improvement in functional abilities.   Baseline: see above Goal status: INITIAL  2.  Patient will negotiate stairs with reciprocal pattern.  Baseline: step to and side stepping  Goal status: INITIAL  3.  Patient will be able to lift at least 15 lbs with proper form without an increase in pain.  Baseline: see above Goal status: INITIAL  4.  Patient will self-report tolerating at least 30 minutes of standing/walking activity.  Baseline: see above Goal status: INITIAL  5.  Patient will be independent with advanced home program to assist in maintenance of her chronic condition.  Baseline: initial HEP issued  Goal status: INITIAL  6.  Patient will demonstrate 5/5 bilateral hip strength to improve stability about the chain with prolonged walking/standing activity.  Baseline:  Goal status: INITIAL  PLAN:  PT FREQUENCY: 2x/week  PT DURATION: 8 weeks  PLANNED INTERVENTIONS: 97164- PT Re-evaluation, 97110-Therapeutic exercises, 97530- Therapeutic activity, [id number]- Neuromuscular re-education, 97535- Self Care, 91478- Manual therapy, [id number]- Gait training, [phone]- Aquatic Therapy, 97014- Electrical stimulation (unattended), [id number]- Electrical stimulation (manual), [id number]- Vasopneumatic device, [id number]- Traction (mechanical), [id number]- Ionotophoresis 4mg /ml Dexamethasone, Patient/Family education, Balance training, Taping, Dry Needling, Joint mobilization, Cryotherapy, and Moist heat.  PLAN FOR NEXT SESSION: Progress generalized LE strengthening; core strengthening; quad stretching; progress CKC as tolerated.   Letitia Libra, PT, DPT, ATC 12/29/22 2:43 PM  Letitia Libra, PT, DPT, ATC 03/07/23 12:55 PM

## 2022-12-31 ENCOUNTER — Other Ambulatory Visit (HOSPITAL_BASED_OUTPATIENT_CLINIC_OR_DEPARTMENT_OTHER): Payer: Self-pay

## 2023-01-02 ENCOUNTER — Other Ambulatory Visit: Payer: Self-pay

## 2023-01-05 ENCOUNTER — Ambulatory Visit: Payer: BC Managed Care – PPO

## 2023-01-13 ENCOUNTER — Other Ambulatory Visit (HOSPITAL_BASED_OUTPATIENT_CLINIC_OR_DEPARTMENT_OTHER): Payer: Self-pay

## 2023-01-14 ENCOUNTER — Other Ambulatory Visit (HOSPITAL_BASED_OUTPATIENT_CLINIC_OR_DEPARTMENT_OTHER): Payer: Self-pay

## 2023-01-17 ENCOUNTER — Other Ambulatory Visit (HOSPITAL_BASED_OUTPATIENT_CLINIC_OR_DEPARTMENT_OTHER): Payer: Self-pay

## 2023-01-17 ENCOUNTER — Ambulatory Visit
Admission: RE | Admit: 2023-01-17 | Discharge: 2023-01-17 | Disposition: A | Discharge status: Home / Self Care | Payer: BC Managed Care – PPO | Source: Ambulatory Visit | Attending: Family Medicine | Admitting: Family Medicine

## 2023-01-17 ENCOUNTER — Other Ambulatory Visit: Payer: Self-pay

## 2023-01-17 ENCOUNTER — Encounter: Payer: Self-pay | Admitting: Nurse Practitioner

## 2023-01-17 VITALS — BP 143/103 | HR 115 | Temp 97.5°F | Resp 16 | Ht 64.0 in | Wt 185.0 lb

## 2023-01-17 DIAGNOSIS — J4540 Moderate persistent asthma, uncomplicated: Secondary | ICD-10-CM

## 2023-01-17 DIAGNOSIS — R030 Elevated blood-pressure reading, without diagnosis of hypertension: Secondary | ICD-10-CM

## 2023-01-17 DIAGNOSIS — J0101 Acute recurrent maxillary sinusitis: Secondary | ICD-10-CM

## 2023-01-17 MED ORDER — LEVOFLOXACIN 500 MG PO TABS
500.0000 mg | ORAL_TABLET | Freq: Every day | ORAL | 0 refills | Status: DC
  Filled 2023-01-17: qty 7, 7d supply, fill #0

## 2023-01-17 NOTE — ED Provider Notes (Signed)
[facility]    CSN: [id number] Arrival date & time: 01/17/23  1302      History   Chief Complaint Chief Complaint  Patient presents with   Nasal Congestion    Sinus infection - Entered by patient    HPI [name] is a 62 y.o. female.   HPI  Patient has asthma and allergies.  She is under the care of of primary care doctor.  She states that her primary care doctor gives her a refillable prescription for Levaquin to take as needed for sinus infections.  Unfortunately, her primary care doctor is on vacation for a couple of weeks.  She is here today requesting a prescription for Levaquin.  She states Augmentin does not work anymore.  Currently has sinus congestion, postnasal drip, sore throat, pressure and pain, headache after taking care of her 96-year-old grandchild who had a cold.  A discussion that she likely has a cold with viral symptoms is not productive  Past Medical History:  Diagnosis Date   Abdominal bloating 11/05/2020   Abnormality of right breast on screening mammogram 10/14/2019   Needs further views of right breast.  Breast center to call.       Achilles tendon contracture, right    Acute non-recurrent frontal sinusitis 12/24/2019   Acute recurrent pansinusitis 08/04/2020   ALLERGIC RHINITIS 04/10/2008   Allergic rhinitis 04/10/2008   Qualifier: Diagnosis of  By: Amador Cunas  MD, Janett Labella    Asthma    asthmatic bronchitis   Breast lump in female    Carpal tunnel syndrome of right wrist    Cellulitis 10/31/2019   Chronic right shoulder pain 05/07/2019   DEPRESSION 08/13/2008   Diabetes mellitus without complication (HCC)    Fatigue 11/17/2015   Fatty liver disease, nonalcoholic 03/29/2013   Hepatitis    fatty liver   HYPERLIPIDEMIA 08/04/2006   HYPERTENSION 08/04/2006   HYPOTHYROIDISM 08/04/2006   Insomnia 10/31/2019   Kidney injury    LOW BACK PAIN 08/04/2006   Moderate persistent asthma with (acute) exacerbation 06/27/2019    Neuromuscular disorder (HCC)    Pain of finger of right hand 02/09/2021   Patellofemoral syndrome, right 06/19/2019   PERIMENOPAUSAL SYNDROME 05/08/2008   Plantar fasciitis of right foot 04/12/2017   Plantar fasciitis, left 11/17/2015   Postmenopausal bleeding 07/17/2019   Biopsy and [place] c/w endometrial polyp.  Pt to see Dr. Earlene Plater for surgical consult.     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   Subacromial impingement of right shoulder 12/31/2020   Moderate persistent asthma without complication 11/27/2020   Reflux gastritis 11/05/2020   Rosacea 08/04/2020   Situational anxiety 05/11/2020   Body mass index (BMI) of 36.0-36.9 in adult 05/11/2020   DDD (degenerative disc disease), cervical 08/30/2019   Nonalcoholic steatohepatitis (NASH) 08/08/2019   Migraine with aura and without status migrainosus, not intractable 05/31/2017   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   Diabetes 1.5, 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   HAGLAND'S DEFORMITY EXCISION     PLANTAR FASCIA RELEASE Right 04/18/2018   Procedure: PLANTAR FASCIA RELEASE AND GASTROCNEMIUS RECESSION RIGHT;  Surgeon: Nadara Mustard, MD;  Location: [facility] OR;  Service: Orthopedics;  Laterality: Right;   TUBAL LIGATION      OB History     Gravida  3   Para  3   Term  3   Preterm      AB      Living  2      SAB      IAB      Ectopic      Multiple      Live Births               Home Medications    Prior to Admission medications   Medication Sig Start Date End Date Taking? Authorizing Provider  albuterol (PROVENTIL) (2.5 MG/3ML) 0.083% nebulizer solution Take 3 mLs (2.5 mg total) by nebulization every 6 (six) hours as needed for wheezing or shortness of breath. 05/10/19  Yes Early, Sung Amabile, NP  albuterol (VENTOLIN HFA) 108 (90 Base) MCG/ACT inhaler Inhale 1-2 puffs into the lungs every 4 (four) hours as needed for wheezing or shortness of breath. 12/09/21  Yes Early, Sung Amabile, NP  Albuterol-Budesonide (AIRSUPRA) 90-80 MCG/ACT AERO Inhale 1-2 Inhalations into the lungs every 6 (six) hours as needed (Wheezing, cough, shortness of breath). 06/28/22  Yes Early, Sung Amabile, NP  ALPRAZolam Prudy Feeler) 0.25 MG tablet Take 1 tablet (0.25 mg total) by mouth 2 (two) times daily as needed for anxiety. 10/09/22  Yes Early, Sung Amabile, NP  aspirin EC 81 MG tablet Take 1 tablet (81 mg total) by mouth daily. Swallow whole. 09/18/19  Yes Lewayne Bunting, MD  Azelaic Acid 15 % gel After skin is thoroughly washed and patted dry, gently but thoroughly massage a thin film of azelaic acid cream into the affected area twice daily, in the morning and evening. 11/17/21  Yes Early, Sung Amabile, NP  azelastine (ASTELIN) 0.1 % nasal spray Place 1 spray into both nostrils daily as needed for rhinitis. 05/10/19  Yes Early, Sung Amabile, NP  B Complex Vitamins (VITAMIN B COMPLEX PO) Take 1 tablet by mouth daily.   Yes [provider]   benazepril (LOTENSIN) 10 MG tablet Take 1 tablet (10 mg total) by mouth daily. 02/03/22  Yes Early, Sung Amabile, NP  celecoxib (CELEBREX) 200 MG capsule Take 1 capsule (200 mg total) by mouth daily. 11/11/22  Yes Early, Sung Amabile, NP  Continuous Glucose Receiver (DEXCOM G7 RECEIVER) DEVI For use with Dexcom G7 sensor 06/14/22  Yes Early, Sung Amabile, NP  Continuous Glucose Sensor (DEXCOM G7 SENSOR) MISC Apply new sensor every 10 days for continuous glucose monitoring. 06/14/22  Yes Early, Sung Amabile, NP  desloratadine (CLARINEX) 5 MG tablet Take 1 tablet (5 mg total) by mouth daily. 09/21/22  Yes Early, Sung Amabile, NP  fluticasone furoate-vilanterol (BREO ELLIPTA) 100-25 MCG/ACT AEPB Inhale 1 puff into the lungs once  daily. 07/08/22  Yes Early, Sung Amabile, NP  levofloxacin (LEVAQUIN) 500 MG tablet Take 1 tablet (500 mg total) by mouth daily. 01/17/23  Yes Eustace Moore, MD  levothyroxine (SYNTHROID) 112 MCG tablet Take 1 tablet (112 mcg total) by mouth daily. 02/17/22  Yes Early, Sung Amabile, NP  Menthol, Topical Analgesic, (ICY HOT EX) Apply 1 application topically daily as needed (pain).   Yes [provider]  metoprolol succinate (TOPROL-XL) 100 MG 24 hr tablet Take 1 tablet (100 mg total) by mouth daily. Take with or immediately following a meal. 02/03/22  Yes Early, Sung Amabile, NP  pantoprazole (PROTONIX) 40 MG tablet Take 1 tablet (40 mg total) by mouth daily. 02/03/22  Yes Early, Sung Amabile, NP  predniSONE (DELTASONE) 20 MG tablet Take 2 tablets (40 mg total) by mouth daily with breakfast. 11/10/22  Yes Early, Sung Amabile, NP  rosuvastatin (CRESTOR) 40 MG tablet Take 1 tablet (40 mg total) by mouth daily. 02/03/22  Yes Early, Sung Amabile, NP  Tiotropium Bromide Monohydrate (SPIRIVA RESPIMAT) 2.5 MCG/ACT AERS Inhale 2 puffs into the lungs once daily. 11/25/22  Yes Early, Sung Amabile, NP  tirzepatide [facility]) 12.5 MG/0.5ML Pen Inject 12.5 mg into the skin once a week. 10/12/22  Yes Early, Sung Amabile, NP  venlafaxine XR (EFFEXOR-XR) 75 MG 24 hr  capsule Take 3 capsules (225 mg total) by mouth daily with breakfast. 01/28/22  Yes Early, Sung Amabile, NP  Vitamin D, Ergocalciferol, (DRISDOL) 1.25 MG (50000 UNIT) CAPS capsule Take 1 capsule (50,000 Units total) by mouth every 7 (seven) days. 02/17/22  Yes Early, Sung Amabile, NP    Family History Family History  Problem Relation Age of Onset   Hypertension Mother    Heart disease Father    Hyperlipidemia Father    Hypertension Father    Alcohol abuse Brother    Diabetes Maternal Grandmother    Alcohol abuse Maternal Uncle    Stroke Maternal Uncle    Ovarian cancer Daughter     Social History Social History   Tobacco Use   Smoking status: Former    Current packs/day: 0.00    Types: Cigarettes    Quit date: 02/15/2004    Years since quitting: 18.9   Smokeless tobacco: Never  Vaping Use   Vaping status: Never Used  Substance Use Topics   Alcohol use: Yes    Comment: occasional   Drug use: No     Allergies   Influenza virus vacc split pf, Lactose, and Lipitor [atorvastatin]   Review of Systems Review of Systems  See HPI Physical Exam Triage Vital Signs ED Triage Vitals  Encounter Vitals Group     BP 01/17/23 1313 (!) 163/103     Systolic BP Percentile --      Diastolic BP Percentile --      Pulse Rate 01/17/23 1313 (!) 101     Resp 01/17/23 1313 18     Temp 01/17/23 1313 (!) 97.5 F (36.4 C)     Temp Source 01/17/23 1313 Oral     SpO2 01/17/23 1313 97 %     Weight 01/17/23 1315 185 lb (83.9 kg)     Height 01/17/23 1315 5\' 4"  (1.626 m)     Head Circumference --      Peak Flow --      Pain Score 01/17/23 1314 7     Pain Loc --      Pain Education --      Exclude from Growth Chart --  No data found.  Updated Vital Signs BP (!) 163/103 (BP Location: Right Arm)   Pulse (!) 101   Temp (!) 97.5 F (36.4 C) (Oral)   Resp 18   Ht 5\' 4"  (1.626 m)   Wt 83.9 kg   SpO2 97%   BMI 31.76 kg/m      Physical Exam Constitutional:      General: She is not in acute  distress.    Appearance: She is well-developed.     Comments: Overweight.  Appears ill.  Appears tired  HENT:     Head: Normocephalic and atraumatic.     Right Ear: Tympanic membrane and ear canal normal.     Left Ear: Tympanic membrane and ear canal normal.     Nose: Congestion and rhinorrhea present.     Mouth/Throat:     Pharynx: Posterior oropharyngeal erythema present.  Eyes:     Conjunctiva/sclera: Conjunctivae normal.     Pupils: Pupils are equal, round, and reactive to light.  Cardiovascular:     Rate and Rhythm: Normal rate and regular rhythm.     Heart sounds: Normal heart sounds.  Pulmonary:     Effort: Pulmonary effort is normal. No respiratory distress.     Breath sounds: Normal breath sounds.  Abdominal:     General: There is no distension.     Palpations: Abdomen is soft.  Musculoskeletal:        General: Normal range of motion.     Cervical back: Normal range of motion.  Lymphadenopathy:     Cervical: No cervical adenopathy.  Skin:    General: Skin is warm and dry.  Neurological:     Mental Status: She is alert.      UC Treatments / Results  Labs (all labs ordered are listed, but only abnormal results are displayed) Labs Reviewed - No data to display  EKG   Radiology No results found.  Procedures Procedures (including critical care time)  Medications Ordered in UC Medications - No data to display  Initial Impression / Assessment and Plan / UC Course  I have reviewed the triage vital signs and the nursing notes.  Pertinent labs & imaging results that were available during my care of the patient were reviewed by me and considered in my medical decision making (see chart for details).     Refilled medication at patient request.  Follow-up with PCP Final Clinical Impressions(s) / UC Diagnoses   Final diagnoses:  Acute recurrent maxillary sinusitis  Moderate persistent asthma without complication     Discharge Instructions      Take  the antibiotic when needed for infection Continue usual nasal spray and allergy medicine/inhalers Drink lots of water   ED Prescriptions     Medication Sig Dispense Auth. Provider   levofloxacin (LEVAQUIN) 500 MG tablet Take 1 tablet (500 mg total) by mouth daily. 7 tablet Eustace Moore, MD      PDMP not reviewed this encounter.   Eustace Moore, MD 01/17/23 [phone]

## 2023-01-17 NOTE — ED Triage Notes (Signed)
Patient c/o sinus infection, nasal drainage, facial pain and left ear pain x 3 days.  Patient has taken Mucinex Sinus.

## 2023-01-17 NOTE — Discharge Instructions (Addendum)
Take the antibiotic when needed for infection Continue usual nasal spray and allergy medicine/inhalers Drink lots of water  Follow-up with your primary care doctor.  Need to retake your blood pressure when feeling better

## 2023-01-19 ENCOUNTER — Ambulatory Visit: Payer: BC Managed Care – PPO

## 2023-01-24 ENCOUNTER — Other Ambulatory Visit (HOSPITAL_BASED_OUTPATIENT_CLINIC_OR_DEPARTMENT_OTHER): Payer: Self-pay

## 2023-01-26 ENCOUNTER — Telehealth: Payer: Self-pay

## 2023-01-26 ENCOUNTER — Ambulatory Visit: Payer: BC Managed Care – PPO | Attending: Family Medicine

## 2023-01-26 DIAGNOSIS — M5459 Other low back pain: Secondary | ICD-10-CM | POA: Insufficient documentation

## 2023-01-26 DIAGNOSIS — M25551 Pain in right hip: Secondary | ICD-10-CM | POA: Insufficient documentation

## 2023-01-26 DIAGNOSIS — M6281 Muscle weakness (generalized): Secondary | ICD-10-CM | POA: Insufficient documentation

## 2023-01-26 DIAGNOSIS — G8929 Other chronic pain: Secondary | ICD-10-CM | POA: Insufficient documentation

## 2023-01-26 DIAGNOSIS — M25561 Pain in right knee: Secondary | ICD-10-CM | POA: Insufficient documentation

## 2023-01-26 NOTE — Telephone Encounter (Signed)
Unable to leave voicemail regarding missed PT appointment.   Letitia Libra, PT, DPT, ATC 01/26/23 2:37 PM

## 2023-01-26 NOTE — Therapy (Incomplete)
OUTPATIENT PHYSICAL THERAPY TREATMENT   Patient Name: [name] MRN: [id number] DOB:17-Nov-1960,62 y.o., female Today's Date: 01/26/2023   END OF SESSION:   Past Medical History:  Diagnosis Date   Abdominal bloating 11/05/2020   Abnormality of right breast on screening mammogram 10/14/2019   Needs further views of right breast.  Breast center to call.       Achilles tendon contracture, right    Acute non-recurrent frontal sinusitis 12/24/2019   Acute recurrent pansinusitis 08/04/2020   ALLERGIC RHINITIS 04/10/2008   Allergic rhinitis 04/10/2008   Qualifier: Diagnosis of  By: Amador Cunas  MD, Janett Labella    Asthma    asthmatic bronchitis   Breast lump in female    Carpal tunnel syndrome of right wrist    Cellulitis 10/31/2019   Chronic right shoulder pain 05/07/2019   DEPRESSION 08/13/2008   Diabetes mellitus without complication (HCC)    Fatigue 11/17/2015   Fatty liver disease, nonalcoholic 03/29/2013   Hepatitis    fatty liver   HYPERLIPIDEMIA 08/04/2006   HYPERTENSION 08/04/2006   HYPOTHYROIDISM 08/04/2006   Insomnia 10/31/2019   Kidney injury    LOW BACK PAIN 08/04/2006   Moderate persistent asthma with (acute) exacerbation 06/27/2019   Neuromuscular disorder (HCC)    Pain of finger of right hand 02/09/2021   Patellofemoral syndrome, right 06/19/2019   PERIMENOPAUSAL SYNDROME 05/08/2008   Plantar fasciitis of right foot 04/12/2017   Plantar fasciitis, left 11/17/2015   Postmenopausal bleeding 07/17/2019   Biopsy and [place] c/w endometrial polyp.  Pt to see Dr. Earlene Plater for surgical consult.     Primary insomnia 10/31/2019   PVD (peripheral vascular disease) (HCC)    occlusive, status post bifem bypass 2007   SOB (shortness of breath) 07/12/2016   Vaginal candidiasis 10/31/2019   Past Surgical History:  Procedure Laterality Date   CARPAL TUNNEL RELEASE Right 04/05/2016   Procedure: OPEN RIGHT CARPAL TUNNEL RELEASE;  Surgeon: Kerrin Champagne, MD;  Location: [facility];  Service: Orthopedics;  Laterality: Right;   CHOLECYSTECTOMY     FEMORAL BYPASS     bifem.   HAGLAND'S DEFORMITY EXCISION     PLANTAR FASCIA RELEASE Right 04/18/2018   Procedure: PLANTAR FASCIA RELEASE AND GASTROCNEMIUS RECESSION RIGHT;  Surgeon: Nadara Mustard, MD;  Location: [facility] OR;  Service: Orthopedics;  Laterality: Right;   TUBAL LIGATION     Patient Active Problem List   Diagnosis Date Noted   Family history of dementia 09/13/2022   Chondromalacia of right patella 12/17/2021   Osteoarthritis of right acromioclavicular joint 03/09/2021   Subacromial impingement of right shoulder 12/31/2020   Moderate persistent asthma without complication 11/27/2020   Reflux gastritis 11/05/2020   Rosacea 08/04/2020   Situational anxiety 05/11/2020   Body mass index (BMI) of 36.0-36.9 in adult 05/11/2020   DDD (degenerative disc disease), cervical 08/30/2019   Nonalcoholic steatohepatitis (NASH) 08/08/2019   Migraine with aura and without status migrainosus, not intractable 05/31/2017   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   Diabetes 1.5, 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   Type 2 diabetes mellitus with complications (HCC) 01/14/2015   Vitamin D deficiency 01/14/2015   Peripheral vascular disease (HCC) 07/15/2011   Hypothyroidism 08/04/2006   Hyperlipidemia associated with type 2 diabetes mellitus (HCC) 08/04/2006   Hypertension complicating diabetes (HCC) 08/04/2006   Acute bilateral low back pain without sciatica 08/04/2006    PCP: Tollie Eth, NP  REFERRING PROVIDER: Rodolph Bong, MD   REFERRING DIAG:  M25.561,G89.29 (ICD-10-CM) - Chronic pain of right knee  M25.551,G89.29 (ICD-10-CM) - Chronic right hip pain  M54.50,G89.29 (ICD-10-CM) - Chronic midline low back pain without sciatica    Rationale for Evaluation and Treatment: Rehabilitation  THERAPY DIAG:  No diagnosis found.  ONSET DATE: chronic   SUBJECTIVE:                                                                                                                                                                                            SUBJECTIVE STATEMENT: "I was doing good, but then I cleaned my chandelier."  PERTINENT HISTORY:  Asthma  Rt knee injection on 12/01/22  PAIN:  Are you having pain? Yes: NPRS scale: 3/10 Pain location: Rt knee Pain description: sore; pulling   Aggravating factors: stairs, transfers, lifting, standing/walking > 20 minutes Relieving factors: medication, hot shower; topical analgesic  PRECAUTIONS: Fall  WEIGHT BEARING RESTRICTIONS: No  FALLS:  Has patient fallen in last 6 months? Yes. Number of falls 2; foot got caught in blanket and fell from the couch; tripped going up the stairs due to dog  LIVING ENVIRONMENT: Lives with: lives with their family Lives in: House/apartment Stairs: Yes: Internal: 13 steps; none Has following equipment at home: None  OCCUPATION: work from [employer] job  PLOF: Independent  PATIENT GOALS: "get moving and make sure I keep moving at home."    OBJECTIVE:  Note: Objective measures were completed at Evaluation unless otherwise noted.  DIAGNOSTIC FINDINGS:  No recent imaging on file   PATIENT SURVEYS:  FOTO 43% function to 48% predicted   SCREENING FOR RED FLAGS: None  COGNITION: Overall cognitive status: Within functional limits for tasks assessed     SENSATION: Not tested  MUSCLE LENGTH: Not tested  POSTURE: rounded shoulders, forward head, and flat back  PALPATION: Diffuse tenderness about Rt anterior knee; TTP bilateral lumbar paraspinals   LUMBAR ROM:   AROM eval  Flexion WFL  Extension 25% limited pain   Right lateral flexion WFL pain  Left lateral flexion WFL pain  Right rotation 25% limited  Left rotation 25% limited    (Blank rows = not tested)  LOWER EXTREMITY ROM:     Active  Right eval Left eval  Hip flexion Full   Hip extension    Hip abduction Full    Hip  adduction    Hip internal rotation Mild limitation   Hip external rotation Mild limitation   Knee flexion Full Full  Knee extension Full Full  Ankle dorsiflexion    Ankle plantarflexion    Ankle inversion    Ankle eversion     (Blank  rows = not tested)  LOWER EXTREMITY MMT:    MMT Right eval Left eval  Hip flexion 4- 4+  Hip extension 4- 4-  Hip abduction 4- 4-  Hip adduction    Hip internal rotation    Hip external rotation    Knee flexion 5 5  Knee extension 5 5  Ankle dorsiflexion 5 5  Ankle plantarflexion    Ankle inversion    Ankle eversion     (Blank rows = not tested)  SPECIAL TESTS:  Not tested   FUNCTIONAL TESTS:  5 x STS: 19.12 seconds increased Rt knee pain  Functional lifting: increased trunk flexion, limited hip hinge and limited knee flexion   12/29/22: 5 x STS: 14.45 seconds increased Rt knee pain   GAIT: Distance walked: 10 ft  Assistive device utilized: None Level of assistance: Complete Independence Comments: decreased arm swing; limited trunk rotation   [facility] Adult PT Treatment:                                                DATE: 12/29/22 Therapeutic Exercise: Recumbent bike level 2 x 5 minutes  SLR 2 x 10  Calf stretch on wedge x 1 minute Wall squats partial range 2 x 10  Eccentric leg press 2 x 10 @ 40 lbs  SL calf raise 2 x 10  Standing hip abduction 2 x 10 Standing hip extension 2 x 10  Updated HEP     [facility] Adult PT Treatment:                                                DATE: 12/22/2022 Therapeutic Exercise: Recumbent bike L3 x 5 min Figure 4 LTR x10 (B) Travell piriformis stretch w/strap x30" (R) HS/ITB stretches w/strap x30" each (R) Isometric dead bug hold --> dead bug x10 Bridge + clamshell x10 green TB, x10 blue TB Prone: Press up 10x3" Quad stretch w/strap 2x30" (B) 4" step: R knee drivers Step down --> heel taps Runner's lunge --> gastroc/soleus    [facility] Adult PT Treatment:                                                 DATE: 12/12/2022 Therapeutic Exercise: Recumbent bike L2 x Figure 4 LTR x10 (B) Travell piriformis stretch w/strap 2x30" HS/ITB stretches w/strap x30" each Hooklying hip add ball squeeze 6x5" Bridge + ball squeeze x10  Hooklying bent knee fall out + GTB x10 each leg Bridge + clamshell GTB x10 Modified dead bug with orange PB (feet on mat) --> isometric dead bug hold + orange PB 3x10" Prone: Press up 10x3" Hip extension (straight leg) x10  Quad stretch w/strap 2x30" (B) Seated green PB roll out front & diagonals x5 each    [facility] Adult PT Treatment:                                                DATE: 12/08/2022 Therapeutic Exercise:  Recumbent bike L2-3 x 5 min Figure 4 LTR x10 (B) Supine piriformis stretch x30" --> travell piriformis stretch x30" Supine HS stretch w/strap x30" Hooklying hip add isometric (ball squeeze) 10x5" Bridges + ball squeeze x10 --> bridge x10 S/L clamshell (R) + GTB x15 S/L bent knee hip abd + GTB x15 Prone:  Press up 10x3" Bent knee hip extension x10 (B) Hip IR/ER AROM (wiper legs) Seated R knee LAQ + hip add iso ball squeeze x15 Seated trunk lateral stretch with orange PB roll out (arm on ball, opp hand behind head)    PATIENT EDUCATION:  Education details:HEP update Person educated: Patient Education method: Explanation, Demonstration, Tactile cues, Verbal cues, and Handouts Education comprehension: verbalized understanding, returned demonstration, verbal cues required, tactile cues required, and needs further education  HOME EXERCISE PROGRAM: Access Code: [email or web address] URL: [email or web address] Date: 12/29/2022 Prepared by: Letitia Libra  Exercises - Supine Lower Trunk Rotation  - 1 x daily - 7 x weekly - 2 sets - 10 reps - Supine Figure 4 Piriformis Stretch  - 1 x daily - 7 x weekly - 3 sets - 30  hold - Modified Thomas Stretch  - 1 x daily - 7 x weekly - 3 sets - 30 sec  hold - Supine Bridge  - 1 x daily - 7 x  weekly - 2 sets - 10 reps - Isometric Dead Bug  - 1 x daily - 7 x weekly - 3 sets - 10 reps - Supine Dead Bug with Leg Extension  - 1 x daily - 7 x weekly - 3 sets - 10 reps - Prone Press Up  - 1 x daily - 7 x weekly - 3 sets - 10 reps - 3 sec hold - Prone Hip Extension  - 1 x daily - 7 x weekly - 3 sets - 10 reps - Prone Quadriceps Stretch with Strap  - 1 x daily - 7 x weekly - 3 sets - 10 reps - Clam with Resistance  - 1 x daily - 7 x weekly - 3 sets - 10 reps - Wall Quarter Squat  - 1 x daily - 7 x weekly - 2 sets - 10 reps - Standing Hip Abduction with Counter Support  - 1 x daily - 7 x weekly - 2 sets - 10 reps - Standing Hip Extension with Counter Support  - 1 x daily - 7 x weekly - 2 sets - 10 reps  ASSESSMENT:  CLINICAL IMPRESSION: 5 x STS has improved compared to initial evaluation having met this STG. Focused primarily on lower extremity strength progression today with good tolerance. No signs of quad lag with SLR. With squatting activity she initially demonstrates Rt lateral trunk lean with ability to correct once cued. HEP was updated to include further strengthening.   EVAL: Patient is a 62 y.o. female who was seen today for physical therapy evaluation and treatment for chronic low back, Rt knee, and Rt hip pain that were exacerabated from 2 falls this summer and having a more active role in caring for her 18 year old grandson. Upon assessment she is noted to have limited trunk extension and lateral flexion AROM, bilateral hip weakness, aberrant lifting mechanics, and gait/postural abnormalities. She will benefit from skilled PT to address the above stated deficits in order to optimize her function and assist in overall pain reduction.   OBJECTIVE IMPAIRMENTS: Abnormal gait, decreased activity tolerance, decreased endurance, decreased knowledge of condition, decreased mobility, difficulty walking, decreased ROM, decreased strength,  increased fascial restrictions, impaired flexibility,  improper body mechanics, postural dysfunction, and pain.   ACTIVITY LIMITATIONS: carrying, lifting, bending, standing, squatting, stairs, transfers, locomotion level, and caring for others  PARTICIPATION LIMITATIONS: meal prep, cleaning, laundry, community activity, and yard work  PERSONAL FACTORS: Age, Fitness, Time since onset of injury/illness/exacerbation, and 3+ comorbidities: see PMH above  are also affecting patient's functional outcome.   REHAB POTENTIAL: Good  CLINICAL DECISION MAKING: Evolving/moderate complexity  EVALUATION COMPLEXITY: Moderate   GOALS: Goals reviewed with patient? Yes  SHORT TERM GOALS: Target date: 01/17/2023  Patient will be independent and compliant with initial HEP.   Baseline: see above Goal status: INITIAL  2.  Patient will demonstrate proper lifting mechanics.  Baseline: see above  Goal status: INITIAL  3.  Patient will complete 5 x STS in </= 14 seconds to improve her functional strength.  Baseline: see above  Goal status: MET   LONG TERM GOALS: Target date: 02/04/23  Patient will score at least 48% on FOTO to signify clinically meaningful improvement in functional abilities.   Baseline: see above Goal status: INITIAL  2.  Patient will negotiate stairs with reciprocal pattern.  Baseline: step to and side stepping  Goal status: INITIAL  3.  Patient will be able to lift at least 15 lbs with proper form without an increase in pain.  Baseline: see above Goal status: INITIAL  4.  Patient will self-report tolerating at least 30 minutes of standing/walking activity.  Baseline: see above Goal status: INITIAL  5.  Patient will be independent with advanced home program to assist in maintenance of her chronic condition.  Baseline: initial HEP issued  Goal status: INITIAL  6.  Patient will demonstrate 5/5 bilateral hip strength to improve stability about the chain with prolonged walking/standing activity.  Baseline:  Goal status:  INITIAL  PLAN:  PT FREQUENCY: 2x/week  PT DURATION: 8 weeks  PLANNED INTERVENTIONS: 97164- PT Re-evaluation, 97110-Therapeutic exercises, 97530- Therapeutic activity, [id number]- Neuromuscular re-education, 97535- Self Care, 16109- Manual therapy, [id number]- Gait training, [phone]- Aquatic Therapy, 97014- Electrical stimulation (unattended), [id number]- Electrical stimulation (manual), [id number]- Vasopneumatic device, [id number]- Traction (mechanical), [id number]- Ionotophoresis 4mg /ml Dexamethasone, Patient/Family education, Balance training, Taping, Dry Needling, Joint mobilization, Cryotherapy, and Moist heat.  PLAN FOR NEXT SESSION: Progress generalized LE strengthening; core strengthening; quad stretching; progress CKC as tolerated.   Letitia Libra, PT, DPT, ATC 01/26/23 12:57 PM

## 2023-01-30 ENCOUNTER — Other Ambulatory Visit: Payer: Self-pay | Admitting: Nurse Practitioner

## 2023-01-30 DIAGNOSIS — F321 Major depressive disorder, single episode, moderate: Secondary | ICD-10-CM

## 2023-01-30 NOTE — Telephone Encounter (Signed)
Last apt 11/10/22

## 2023-01-31 ENCOUNTER — Other Ambulatory Visit (HOSPITAL_BASED_OUTPATIENT_CLINIC_OR_DEPARTMENT_OTHER): Payer: Self-pay

## 2023-01-31 MED ORDER — VENLAFAXINE HCL ER 75 MG PO CP24
225.0000 mg | ORAL_CAPSULE | Freq: Every day | ORAL | 3 refills | Status: DC
  Filled 2023-02-09: qty 270, 90d supply, fill #0
  Filled 2023-06-01: qty 270, 90d supply, fill #1
  Filled 2023-06-03: qty 30, 10d supply, fill #1
  Filled 2023-06-09: qty 270, 90d supply, fill #2
  Filled 2023-06-13: qty 30, 10d supply, fill #2
  Filled 2023-06-23: qty 30, 10d supply, fill #3
  Filled 2023-07-06: qty 30, 10d supply, fill #4
  Filled 2023-07-13 – 2023-07-14 (×2): qty 30, 10d supply, fill #5
  Filled 2023-07-31: qty 270, 90d supply, fill #6
  Filled 2023-09-01 – 2023-11-17 (×4): qty 270, 90d supply, fill #7

## 2023-02-04 ENCOUNTER — Other Ambulatory Visit: Payer: Self-pay | Admitting: Nurse Practitioner

## 2023-02-04 DIAGNOSIS — I10 Essential (primary) hypertension: Secondary | ICD-10-CM

## 2023-02-06 ENCOUNTER — Ambulatory Visit
Admission: EM | Admit: 2023-02-06 | Discharge: 2023-02-06 | Disposition: A | Discharge status: Home / Self Care | Payer: BC Managed Care – PPO | Attending: Family Medicine | Admitting: Family Medicine

## 2023-02-06 ENCOUNTER — Other Ambulatory Visit (HOSPITAL_BASED_OUTPATIENT_CLINIC_OR_DEPARTMENT_OTHER): Payer: Self-pay

## 2023-02-06 DIAGNOSIS — J3489 Other specified disorders of nose and nasal sinuses: Secondary | ICD-10-CM

## 2023-02-06 DIAGNOSIS — J0191 Acute recurrent sinusitis, unspecified: Secondary | ICD-10-CM | POA: Diagnosis not present

## 2023-02-06 MED ORDER — AMOXICILLIN-POT CLAVULANATE 875-125 MG PO TABS: 1.0000 | ORAL_TABLET | Freq: Two times a day (BID) | ORAL | 0 refills | Status: DC

## 2023-02-06 MED ORDER — PREDNISONE 20 MG PO TABS: ORAL_TABLET | ORAL | 0 refills | Status: DC

## 2023-02-06 MED ORDER — METOPROLOL SUCCINATE ER 100 MG PO TB24
100.0000 mg | ORAL_TABLET | Freq: Every day | ORAL | 0 refills | Status: DC
  Filled 2023-03-23: qty 90, 90d supply, fill #0

## 2023-02-06 MED ORDER — BENAZEPRIL HCL 10 MG PO TABS
10.0000 mg | ORAL_TABLET | Freq: Every day | ORAL | 0 refills | Status: DC
  Filled 2023-03-04: qty 90, 90d supply, fill #0

## 2023-02-06 NOTE — Discharge Instructions (Addendum)
Instructed patient to take medications as directed with food to completion.  Advised patient to take prednisone with first dose of Augmentin for the next 5 of 10 days.  Encouraged increase daily water intake to 64 ounces per day while taking these medications.  Advised if symptoms worsen and or unresolved please follow-up with PCP or here for further evaluation.

## 2023-02-06 NOTE — ED Provider Notes (Signed)
[facility]    CSN: [id number] Arrival date & time: 02/06/23  1417      History   Chief Complaint Chief Complaint  Patient presents with   sinus congestion     HPI [name] is a 62 y.o. female.   HPI Pleasant 62 year old female presents with sinus nasal pressure for 3 days. PMH significant for morbid obesity, asthma, and acute nonrecurrent frontal sinusitis.  Past Medical History:  Diagnosis Date   Abdominal bloating 11/05/2020   Abnormality of right breast on screening mammogram 10/14/2019   Needs further views of right breast.  Breast center to call.       Achilles tendon contracture, right    Acute non-recurrent frontal sinusitis 12/24/2019   Acute recurrent pansinusitis 08/04/2020   ALLERGIC RHINITIS 04/10/2008   Allergic rhinitis 04/10/2008   Qualifier: Diagnosis of  By: Amador Cunas  MD, Janett Labella    Asthma    asthmatic bronchitis   Breast lump in female    Carpal tunnel syndrome of right wrist    Cellulitis 10/31/2019   Chronic right shoulder pain 05/07/2019   DEPRESSION 08/13/2008   Diabetes mellitus without complication (HCC)    Fatigue 11/17/2015   Fatty liver disease, nonalcoholic 03/29/2013   Hepatitis    fatty liver   HYPERLIPIDEMIA 08/04/2006   HYPERTENSION 08/04/2006   HYPOTHYROIDISM 08/04/2006   Insomnia 10/31/2019   Kidney injury    LOW BACK PAIN 08/04/2006   Moderate persistent asthma with (acute) exacerbation 06/27/2019   Neuromuscular disorder (HCC)    Pain of finger of right hand 02/09/2021   Patellofemoral syndrome, right 06/19/2019   PERIMENOPAUSAL SYNDROME 05/08/2008   Plantar fasciitis of right foot 04/12/2017   Plantar fasciitis, left 11/17/2015   Postmenopausal bleeding 07/17/2019   Biopsy and [place] c/w endometrial polyp.  Pt to see Dr. Earlene Plater for surgical consult.     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   Subacromial impingement of right shoulder 12/31/2020   Moderate persistent asthma without complication 11/27/2020   Reflux gastritis 11/05/2020   Rosacea 08/04/2020   Situational anxiety 05/11/2020   Body mass index (BMI) of 36.0-36.9 in adult 05/11/2020   DDD (degenerative disc disease), cervical 08/30/2019   Nonalcoholic steatohepatitis (NASH) 08/08/2019   Migraine with aura and without status migrainosus, not intractable 05/31/2017   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   Diabetes 1.5, 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   Type 2 diabetes mellitus with complications (HCC) 01/14/2015   Vitamin D deficiency 01/14/2015   Peripheral vascular disease (HCC) 07/15/2011   Hypothyroidism 08/04/2006   Hyperlipidemia associated with type 2 diabetes mellitus (HCC) 08/04/2006   Hypertension complicating diabetes (HCC) 08/04/2006   Acute bilateral low back pain without sciatica 08/04/2006    Past Surgical History:  Procedure Laterality Date   CARPAL TUNNEL RELEASE Right 04/05/2016   Procedure: OPEN RIGHT CARPAL TUNNEL RELEASE;  Surgeon: Kerrin Champagne, MD;  Location: [facility];  Service: Orthopedics;  Laterality: Right;   CHOLECYSTECTOMY     FEMORAL BYPASS     bifem.   HAGLAND'S DEFORMITY EXCISION     PLANTAR FASCIA RELEASE Right 04/18/2018  Procedure: PLANTAR FASCIA RELEASE AND GASTROCNEMIUS RECESSION RIGHT;  Surgeon: Nadara Mustard, MD;  Location: [facility];  Service: Orthopedics;  Laterality: Right;   TUBAL LIGATION      OB History     Gravida  3   Para  3   Term  3   Preterm      AB      Living  2      SAB      IAB      Ectopic      Multiple      Live Births               Home Medications    Prior to  Admission medications   Medication Sig Start Date End Date Taking? Authorizing Provider  amoxicillin-clavulanate (AUGMENTIN) 875-125 MG tablet Take 1 tablet by mouth 2 (two) times daily for 10 days. 02/06/23 02/16/23 Yes Trevor Iha, FNP  predniSONE (DELTASONE) 20 MG tablet Take 3 tabs PO daily x 5 days. 02/06/23  Yes Trevor Iha, FNP  albuterol (PROVENTIL) (2.5 MG/3ML) 0.083% nebulizer solution Take 3 mLs (2.5 mg total) by nebulization every 6 (six) hours as needed for wheezing or shortness of breath. 05/10/19   Early, Sung Amabile, NP  albuterol (VENTOLIN HFA) 108 (90 Base) MCG/ACT inhaler Inhale 1-2 puffs into the lungs every 4 (four) hours as needed for wheezing or shortness of breath. 12/09/21   Tollie Eth, NP  Albuterol-Budesonide (AIRSUPRA) 90-80 MCG/ACT AERO Inhale 1-2 Inhalations into the lungs every 6 (six) hours as needed (Wheezing, cough, shortness of breath). 06/28/22   Tollie Eth, NP  ALPRAZolam Prudy Feeler) 0.25 MG tablet Take 1 tablet (0.25 mg total) by mouth 2 (two) times daily as needed for anxiety. 10/09/22   Tollie Eth, NP  aspirin EC 81 MG tablet Take 1 tablet (81 mg total) by mouth daily. Swallow whole. 09/18/19   Lewayne Bunting, MD  Azelaic Acid 15 % gel After skin is thoroughly washed and patted dry, gently but thoroughly massage a thin film of azelaic acid cream into the affected area twice daily, in the morning and evening. 11/17/21   Tollie Eth, NP  azelastine (ASTELIN) 0.1 % nasal spray Place 1 spray into both nostrils daily as needed for rhinitis. 05/10/19   Tollie Eth, NP  B Complex Vitamins (VITAMIN B COMPLEX PO) Take 1 tablet by mouth daily.    [provider]  benazepril (LOTENSIN) 10 MG tablet Take 1 tablet (10 mg total) by mouth daily. 02/06/23   Tollie Eth, NP  celecoxib (CELEBREX) 200 MG capsule Take 1 capsule (200 mg total) by mouth daily. 11/11/22   Tollie Eth, NP  Continuous Glucose Receiver (DEXCOM G7 RECEIVER) DEVI For use with Dexcom G7  sensor 06/14/22   Early, Sung Amabile, NP  Continuous Glucose Sensor (DEXCOM G7 SENSOR) MISC Apply new sensor every 10 days for continuous glucose monitoring. 06/14/22   Tollie Eth, NP  desloratadine (CLARINEX) 5 MG tablet Take 1 tablet (5 mg total) by mouth daily. 09/21/22   Tollie Eth, NP  fluticasone furoate-vilanterol (BREO ELLIPTA) 100-25 MCG/ACT AEPB Inhale 1 puff into the lungs once daily. 07/08/22   Tollie Eth, NP  levofloxacin (LEVAQUIN) 500 MG tablet Take 1 tablet (500 mg total) by mouth daily. 01/17/23   Eustace Moore, MD  levothyroxine (SYNTHROID) 112 MCG tablet Take 1 tablet (112 mcg total) by mouth daily. 02/17/22   Tollie Eth, NP  Menthol, Topical Analgesic, (ICY HOT EX) Apply 1 application topically daily as needed (pain).    [provider]  metoprolol succinate (TOPROL-XL) 100 MG 24 hr tablet Take 1 tablet (100 mg total) by mouth daily. Take with or immediately following a meal. 02/06/23   Early, Sung Amabile, NP  pantoprazole (PROTONIX) 40 MG tablet Take 1 tablet (40 mg total) by mouth daily. 02/03/22   Tollie Eth, NP  rosuvastatin (CRESTOR) 40 MG tablet Take 1 tablet (40 mg total) by mouth daily. 02/03/22   Tollie Eth, NP  Tiotropium Bromide Monohydrate (SPIRIVA RESPIMAT) 2.5 MCG/ACT AERS Inhale 2 puffs into the lungs once daily. 11/25/22   Tollie Eth, NP  tirzepatide [facility]) 12.5 MG/0.5ML Pen Inject 12.5 mg into the skin once a week. 10/12/22   Tollie Eth, NP  venlafaxine XR (EFFEXOR-XR) 75 MG 24 hr capsule Take 3 capsules (225 mg total) by mouth daily with breakfast. 01/31/23   Early, Sung Amabile, NP  Vitamin D, Ergocalciferol, (DRISDOL) 1.25 MG (50000 UNIT) CAPS capsule Take 1 capsule (50,000 Units total) by mouth every 7 (seven) days. 02/17/22   Tollie Eth, NP    Family History Family History  Problem Relation Age of Onset   Hypertension Mother    Heart disease Father    Hyperlipidemia Father    Hypertension Father    Alcohol abuse Brother    Diabetes  Maternal Grandmother    Alcohol abuse Maternal Uncle    Stroke Maternal Uncle    Ovarian cancer Daughter     Social History Social History   Tobacco Use   Smoking status: Former    Current packs/day: 0.00    Types: Cigarettes    Quit date: 02/15/2004    Years since quitting: 18.9   Smokeless tobacco: Never  Vaping Use   Vaping status: Never Used  Substance Use Topics   Alcohol use: Yes    Comment: occasional   Drug use: No     Allergies   Influenza virus vacc split pf, Lactose, and Lipitor [atorvastatin]   Review of Systems Review of Systems   Physical Exam Triage Vital Signs ED Triage Vitals  Encounter Vitals Group     BP 02/06/23 1437 120/76     Systolic BP Percentile --      Diastolic BP Percentile --      Pulse Rate 02/06/23 1437 72     Resp 02/06/23 1437 16     Temp 02/06/23 1437 (!) 97.4 F (36.3 C)     Temp src --      SpO2 02/06/23 1437 98 %     Weight --      Height --      Head Circumference --      Peak Flow --      Pain Score 02/06/23 1435 8     Pain Loc --      Pain Education --      Exclude from Growth Chart --    No data found.  Updated Vital Signs BP 120/76   Pulse 72   Temp (!) 97.4 F (36.3 C)   Resp 16   SpO2 98%   Visual Acuity Right Eye Distance:   Left Eye Distance:   Bilateral Distance:    Right Eye Near:   Left Eye Near:    Bilateral Near:     Physical Exam Vitals and nursing note reviewed.  Constitutional:      Appearance: Normal appearance. She is obese.  She is ill-appearing.  HENT:     Head: Normocephalic and atraumatic.     Right Ear: Tympanic membrane and external ear normal.     Left Ear: Tympanic membrane and external ear normal.     Nose:     Right Sinus: Maxillary sinus tenderness present.     Left Sinus: Maxillary sinus tenderness present.     Comments: Turbinates are erythematous/edematous    Mouth/Throat:     Mouth: Mucous membranes are moist.     Pharynx: Oropharynx is clear.  Eyes:      Extraocular Movements: Extraocular movements intact.     Conjunctiva/sclera: Conjunctivae normal.     Pupils: Pupils are equal, round, and reactive to light.  Cardiovascular:     Rate and Rhythm: Normal rate and regular rhythm.     Pulses: Normal pulses.     Heart sounds: Normal heart sounds.  Pulmonary:     Effort: Pulmonary effort is normal.     Breath sounds: Normal breath sounds. No wheezing, rhonchi or rales.  Musculoskeletal:        General: Normal range of motion.     Cervical back: Normal range of motion and neck supple.  Skin:    General: Skin is warm and dry.  Neurological:     General: No focal deficit present.     Mental Status: She is alert and oriented to person, place, and time. Mental status is at baseline.  Psychiatric:        Mood and Affect: Mood normal.        Behavior: Behavior normal.        Thought Content: Thought content normal.      UC Treatments / Results  Labs (all labs ordered are listed, but only abnormal results are displayed) Labs Reviewed - No data to display  EKG   Radiology No results found.  Procedures Procedures (including critical care time)  Medications Ordered in UC Medications - No data to display  Initial Impression / Assessment and Plan / UC Course  I have reviewed the triage vital signs and the nursing notes.  Pertinent labs & imaging results that were available during my care of the patient were reviewed by me and considered in my medical decision making (see chart for details).     MDM: 1.  Acute recurrent sinusitis, unspecified location-Rx'd Augmentin 875/125 mg tablet: Take 1 tablet twice daily x 10 days; 2.  Sinus pressure-Rx'd prednisone 20 mg tablet: Take 3 tabs p.o. daily x 5 days. Instructed patient to take medications as directed with food to completion.  Advised patient to take prednisone with first dose of Augmentin for the next 5 of 10 days.  Encouraged increase daily water intake to 64 ounces per day while  taking these medications.  Advised if symptoms worsen and/or unresolved please follow-up with PCP or here for further evaluation.  Patient discharged home, hemodynamically stable.  Final Clinical Impressions(s) / UC Diagnoses   Final diagnoses:  Acute recurrent sinusitis, unspecified location  Sinus pressure     Discharge Instructions      Instructed patient to take medications as directed with food to completion.  Advised patient to take prednisone with first dose of Augmentin for the next 5 of 10 days.  Encouraged increase daily water intake to 64 ounces per day while taking these medications.  Advised if symptoms worsen and/or unresolved please follow-up with PCP or here for further evaluation.     ED Prescriptions     Medication  Sig Dispense Auth. Provider   amoxicillin-clavulanate (AUGMENTIN) 875-125 MG tablet Take 1 tablet by mouth 2 (two) times daily for 10 days. 20 tablet Trevor Iha, FNP   predniSONE (DELTASONE) 20 MG tablet Take 3 tabs PO daily x 5 days. 15 tablet Trevor Iha, FNP      PDMP not reviewed this encounter.   Trevor Iha, FNP 02/06/23 1528

## 2023-02-06 NOTE — ED Triage Notes (Signed)
Pt presents to uc with co of sinus pain and drainage for 3 days. Pt reports she was recently here with sinus infection and was given an antibiotic and her pcp rx prednisone since Saturday but continues to have pain.

## 2023-02-09 ENCOUNTER — Other Ambulatory Visit (HOSPITAL_BASED_OUTPATIENT_CLINIC_OR_DEPARTMENT_OTHER): Payer: Self-pay

## 2023-02-09 ENCOUNTER — Other Ambulatory Visit: Payer: Self-pay

## 2023-02-09 ENCOUNTER — Other Ambulatory Visit: Payer: Self-pay | Admitting: Nurse Practitioner

## 2023-02-09 DIAGNOSIS — E785 Hyperlipidemia, unspecified: Secondary | ICD-10-CM

## 2023-02-09 DIAGNOSIS — K2960 Other gastritis without bleeding: Secondary | ICD-10-CM

## 2023-02-09 MED ORDER — PANTOPRAZOLE SODIUM 40 MG PO TBEC
40.0000 mg | DELAYED_RELEASE_TABLET | Freq: Every day | ORAL | 0 refills | Status: DC
  Filled 2023-02-28: qty 90, 90d supply, fill #0

## 2023-02-09 MED ORDER — ROSUVASTATIN CALCIUM 40 MG PO TABS
40.0000 mg | ORAL_TABLET | Freq: Every day | ORAL | 0 refills | Status: DC
  Filled 2023-02-28: qty 90, 90d supply, fill #0

## 2023-02-09 NOTE — Telephone Encounter (Signed)
Has an appt in March 2025

## 2023-02-13 ENCOUNTER — Other Ambulatory Visit (HOSPITAL_BASED_OUTPATIENT_CLINIC_OR_DEPARTMENT_OTHER): Payer: Self-pay

## 2023-02-13 ENCOUNTER — Encounter: Payer: Self-pay | Admitting: Nurse Practitioner

## 2023-02-13 ENCOUNTER — Ambulatory Visit: Payer: BC Managed Care – PPO | Admitting: Nurse Practitioner

## 2023-02-13 VITALS — BP 120/76 | HR 71 | Wt 194.0 lb

## 2023-02-13 DIAGNOSIS — R079 Chest pain, unspecified: Secondary | ICD-10-CM | POA: Insufficient documentation

## 2023-02-13 DIAGNOSIS — J4540 Moderate persistent asthma, uncomplicated: Secondary | ICD-10-CM | POA: Diagnosis not present

## 2023-02-13 DIAGNOSIS — J0141 Acute recurrent pansinusitis: Secondary | ICD-10-CM | POA: Insufficient documentation

## 2023-02-13 DIAGNOSIS — R52 Pain, unspecified: Secondary | ICD-10-CM

## 2023-02-13 DIAGNOSIS — F418 Other specified anxiety disorders: Secondary | ICD-10-CM

## 2023-02-13 HISTORY — DX: Pain, unspecified: R52

## 2023-02-13 HISTORY — DX: Chest pain, unspecified: R07.9

## 2023-02-13 MED ORDER — ALPRAZOLAM 0.5 MG PO TABS
0.2500 mg | ORAL_TABLET | Freq: Two times a day (BID) | ORAL | 3 refills | Status: DC | PRN
  Filled 2023-02-13: qty 60, 30d supply, fill #0
  Filled 2023-08-10: qty 60, 30d supply, fill #1

## 2023-02-13 MED ORDER — ACETAMINOPHEN-CODEINE 300-30 MG PO TABS
1.0000 | ORAL_TABLET | Freq: Four times a day (QID) | ORAL | 0 refills | Status: DC | PRN
  Filled 2023-02-13: qty 42, 7d supply, fill #0

## 2023-02-13 MED ORDER — NITROGLYCERIN 0.4 MG SL SUBL
0.4000 mg | SUBLINGUAL_TABLET | SUBLINGUAL | 3 refills | Status: AC | PRN
  Filled 2023-02-13: qty 25, 8d supply, fill #0

## 2023-02-13 MED ORDER — LEVOFLOXACIN 500 MG PO TABS
500.0000 mg | ORAL_TABLET | Freq: Every day | ORAL | 0 refills | Status: DC
  Filled 2023-02-13: qty 7, 7d supply, fill #0

## 2023-02-13 NOTE — Assessment & Plan Note (Signed)
High stress levels due to family issues and health concerns. Occasional use of Xanax for panic moments. Symptoms include chest tightness and difficulty breathing, which improve with rest and Xanax. Discussed increased dosage of Xanax, including risks of dependency and benefits of anxiety relief. Encouraged stress management techniques and consideration of counseling. - Increase Xanax dosage to 0.5 mg daily, with the option to take a full dose during panic moments - Encourage stress management techniques and consider counseling

## 2023-02-13 NOTE — Progress Notes (Signed)
Tollie Eth, DNP, AGNP-c [facility] [address] [place], [place] [address] [phone]   ACUTE VISIT- ESTABLISHED PATIENT  Blood pressure 120/76, pulse 71, weight 194 lb (88 kg), SpO2 99%.  Subjective:  HPI [name] is a 62 y.o. female presents to day for evaluation of acute concern(s).   History of Present Illness Laterra initially presented with symptoms of sinus pressure, severe headache, and pain that began around Thanksgiving. Despite two visits to urgent care and treatment with Levofloxacin, Augmentin and two rounds of prednisone, the symptoms persisted. Breane reported that the sinus symptoms were not new but had changed in nature over the last few years. Previously, sinus infections would result in thin mucus drainage from the nose, but now the patient experiences swelling and pressure, with mucus unable to drain properly. The patient also reported a severe headache, describing it as if her eye was going to explode with the current infection. She has been managing her symptoms with various medications, including prednisone, which she reported caused her to feel hyper-agitated and warm. She did take a hydrocodone from an old prescription at one point due to the severe pain in her sinuses and head that was not relieved with anything else. She reported that she has been sleeping elevated due to the sinus pressure and has been experiencing hot flashes, which she was unsure if it was due to the prednisone or a return of menopausal symptoms.  In addition to the sinus symptoms Yariza reported chest pains, which she associated with physical activity. 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 The chest pain was described as radiating from the left chest into the shoulder blade, jaw, and back gums, and was occasionally severe enough to consider calling an ambulance. The patient also reported  feeling exhausted all the time. She reported not seeing cardiology for several years, although, she has had a few tests read by Dr. Jens Som. She is not currently experiencing any symptoms.    Shanikqua's stress levels were reported to be high due to personal and family issues. She is not sure if these are a contributor to the chest pain. She reported taking Xanax to manage stress and anxiety and reported that the chest tightness seemed to improve with rest and Xanax.  She reported that decongestants, in combination with her thyroid medication, caused her heart to "go bananas." The patient also reported a history of costochondritis, which she believes may be contributing to her chest pain.  ROS negative except for what is listed in HPI. History, Medications, Surgery, SDOH, and Family History reviewed and updated as appropriate.  Objective:  Physical Exam Vitals and nursing note reviewed.  Constitutional:      Appearance: She is ill-appearing.  HENT:     Head: Normocephalic.     Right Ear: Tympanic membrane normal.     Left Ear: A middle ear effusion is present. Tympanic membrane is bulging.     Nose: Mucosal edema and congestion present.     Right Turbinates: Enlarged and swollen.     Left Turbinates: Enlarged and swollen.     Right Sinus: Maxillary sinus tenderness and frontal sinus tenderness present.     Left Sinus: Maxillary sinus tenderness and frontal sinus tenderness present.     Mouth/Throat:     Lips: Pink.     Mouth: Mucous membranes are moist.     Pharynx: Oropharynx is clear. Uvula midline. Postnasal  drip present.  Cardiovascular:     Rate and Rhythm: Normal rate and regular rhythm.     Pulses: Normal pulses.     Heart sounds: Normal heart sounds.  Pulmonary:     Effort: Pulmonary effort is normal.     Breath sounds: Normal breath sounds. No wheezing or rhonchi.  Musculoskeletal:     Cervical back: Neck supple.     Right lower leg: No edema.     Left lower leg: No edema.   Lymphadenopathy:     Cervical: Cervical adenopathy present.  Skin:    General: Skin is warm and dry.     Capillary Refill: Capillary refill takes less than 2 seconds.  Neurological:     Mental Status: She is alert and oriented to person, place, and time.     Motor: No weakness.  Psychiatric:        Mood and Affect: Mood normal.        Behavior: Behavior normal.          Assessment & Plan:   Problem List Items Addressed This Visit     Situational anxiety   High stress levels due to family issues and health concerns. Occasional use of Xanax for panic moments. Symptoms include chest tightness and difficulty breathing, which improve with rest and Xanax. Discussed increased dosage of Xanax, including risks of dependency and benefits of anxiety relief. Encouraged stress management techniques and consideration of counseling. - Increase Xanax dosage to 0.5 mg daily, with the option to take a full dose during panic moments - Encourage stress management techniques and consider counseling      Relevant Medications   ALPRAZolam (XANAX) 0.5 MG tablet   Acute recurrent pansinusitis - Primary   Persistent sinus symptoms including pain, pressure, and severe headache since Thanksgiving. Symptoms have not improved with Augmentin and prednisone. Noted swelling and pressure in the sinuses, particularly on the left side. Previous treatment with Levofloxacin provided partial relief. Significant discomfort and nasal obstruction reported. Discussed potential need for endoscopic examination by ENT, including risks and benefits. - Refer to ENT for further evaluation and possible endoscopic examination - Prescribe Levofloxacin - Discontinue prednisone due to adverse effects      Relevant Medications   levofloxacin (LEVAQUIN) 500 MG tablet   Other Relevant Orders   Ambulatory referral to ENT   Chest pain on exertion   Intermittent chest pain exacerbated by activity and stress. 65% coronary artery  blockage noted in August. Symptoms include pain radiating to the jaw and back teeth, and occasional tightness. Differential diagnosis includes musculoskeletal pain, anxiety, and cardiac etiology. Discussed use of nitroglycerin for chest pain, including risks and benefits. Need for cardiology follow-up.  - Prescribe nitroglycerin for chest pain episodes - Refer to cardiologist (Dr. Jens Som) for further evaluation - Advise monitoring symptoms and seeking immediate care if pain persists or worsens      Relevant Medications   nitroGLYCERIN (NITROSTAT) 0.4 MG SL tablet   Other Relevant Orders   Ambulatory referral to Cardiology   Pain   Relevant Medications   acetaminophen-codeine (TYLENOL #3) 300-30 MG tablet   Moderate persistent asthma without complication    Time: 48 minutes, >50% spent counseling, care coordination, chart review, and documentation.    Tollie Eth, DNP, AGNP-c

## 2023-02-13 NOTE — Patient Instructions (Addendum)
I have sent in a second round of levofloxacin for you to take to see if we can get this knocked out.   I have sent the referral to ENT for further evaluation.   I have sent in an increased dose in the xanax. I would like you to take 1/2 tab every single day to see if we can get your anxiety down. You can take a full tab if you need to for anxiety.   I also sent in nitroglycerin for you to use in the event you have chest pain again. I also sent a referral to Dr. Jens Som for evaluation. If this gets worse please go to the emergency room for evaluation.   I sent in pain medication to help with the severe headache. Take this as little as possible, but take it when needed.

## 2023-02-13 NOTE — Assessment & Plan Note (Signed)
Persistent sinus symptoms including pain, pressure, and severe headache since Thanksgiving. Symptoms have not improved with Augmentin and prednisone. Noted swelling and pressure in the sinuses, particularly on the left side. Previous treatment with Levofloxacin provided partial relief. Significant discomfort and nasal obstruction reported. Discussed potential need for endoscopic examination by ENT, including risks and benefits. - Refer to ENT for further evaluation and possible endoscopic examination - Prescribe Levofloxacin - Discontinue prednisone due to adverse effects

## 2023-02-13 NOTE — Assessment & Plan Note (Signed)
Intermittent chest pain exacerbated by activity and stress. 65% coronary artery blockage noted in August. Symptoms include pain radiating to the jaw and back teeth, and occasional tightness. Differential diagnosis includes musculoskeletal pain, anxiety, and cardiac etiology. Discussed use of nitroglycerin for chest pain, including risks and benefits. Need for cardiology follow-up.  - Prescribe nitroglycerin for chest pain episodes - Refer to cardiologist (Dr. Jens Som) for further evaluation - Advise monitoring symptoms and seeking immediate care if pain persists or worsens

## 2023-02-16 ENCOUNTER — Ambulatory Visit (INDEPENDENT_AMBULATORY_CARE_PROVIDER_SITE_OTHER): Payer: BC Managed Care – PPO | Admitting: Otolaryngology

## 2023-02-16 ENCOUNTER — Other Ambulatory Visit (HOSPITAL_BASED_OUTPATIENT_CLINIC_OR_DEPARTMENT_OTHER): Payer: Self-pay

## 2023-02-16 ENCOUNTER — Encounter (INDEPENDENT_AMBULATORY_CARE_PROVIDER_SITE_OTHER): Payer: Self-pay | Admitting: Otolaryngology

## 2023-02-16 VITALS — BP 135/80 | HR 72 | Ht 64.0 in | Wt 193.0 lb

## 2023-02-16 DIAGNOSIS — R0981 Nasal congestion: Secondary | ICD-10-CM | POA: Diagnosis not present

## 2023-02-16 DIAGNOSIS — J343 Hypertrophy of nasal turbinates: Secondary | ICD-10-CM

## 2023-02-16 DIAGNOSIS — J3089 Other allergic rhinitis: Secondary | ICD-10-CM | POA: Diagnosis not present

## 2023-02-16 DIAGNOSIS — J342 Deviated nasal septum: Secondary | ICD-10-CM | POA: Diagnosis not present

## 2023-02-16 DIAGNOSIS — J329 Chronic sinusitis, unspecified: Secondary | ICD-10-CM

## 2023-02-16 MED ORDER — CETIRIZINE HCL 10 MG PO TABS
10.0000 mg | ORAL_TABLET | Freq: Every day | ORAL | 11 refills | Status: DC
  Filled 2023-02-16: qty 30, 30d supply, fill #0
  Filled 2023-03-11 – 2023-03-22 (×3): qty 30, 30d supply, fill #1
  Filled 2023-04-15 – 2023-07-13 (×3): qty 30, 30d supply, fill #2
  Filled 2023-10-01: qty 30, 30d supply, fill #3
  Filled 2024-01-05: qty 30, 30d supply, fill #4
  Filled 2024-02-06: qty 30, 30d supply, fill #5

## 2023-02-16 MED ORDER — FLUTICASONE PROPIONATE 50 MCG/ACT NA SUSP
2.0000 | Freq: Two times a day (BID) | NASAL | 6 refills | Status: AC
  Filled 2023-02-16: qty 16, 30d supply, fill #0
  Filled 2023-03-11 – 2023-03-24 (×4): qty 16, 30d supply, fill #1

## 2023-02-16 NOTE — Patient Instructions (Addendum)
-   start Zyrtec and Flonase  - start nasal saline rinses  - scheduled CT of the sinuses Lloyd Huger Med Nasal Saline Rinse   - start nasal saline rinses with [employer]le available over the counter or online to help with nasal congestion

## 2023-02-16 NOTE — Progress Notes (Signed)
 ENT CONSULT:  Reason for Consult: chronic vs recurrent sinusitis    HPI: Discussed the use of AI scribe software for clinical note transcription with the patient, who gave verbal consent to proceed.  History of Present Illness   The patient is a 63 yoF, with a history of chronic nasal congestion and sinus infections, asthma, and Graves' disease, presents with recurrent sinus infections occurring approximately every other month for the past couple of years. The most recent episode began around Thanksgiving and has persisted despite multiple courses of antibiotics and steroids. The patient describes severe pressure around her eyes and ears, along with  headaches. She did not have relief with Augmentin  and prednisone  providing little relief. The patient's primary care provider switched them back to Levofloxacin , which has been more effective in the past.  The patient's asthma is currently managed with Breo and another unspecified inhaler, and the condition has been largely under control. The last severe asthma exacerbation occurred in the summer and was managed with an extra dose of Breo and half a Xanax  for relaxation.  The patient's sinusitis symptoms have evolved over time. Previously, sinus infections would produce a significant amount of discharge, but recently the patient experiences mostly pressure without much discharge. The patient has never had sinus or nasal surgery but was told they have a deviated septum.  The patient also has a history of Graves' disease, for which their thyroid  was ablated with radioactive iodine approximately thirty years ago. They have been on thyroid  replacement therapy since then. The patient also has a history of nonallergic chronic rhinitis, as confirmed by allergy testing a few years ago.  The patient has been using Afrin for the past couple of days, which provides some relief, but overall, the patient's symptoms persist. The patient also has a history of type 2  diabetes, which is currently managed with Mounjaro , leading to significant weight loss and improved blood sugar control.     Records Reviewed:  PCP office visit 02/13/23 [name] is a 63 y.o. female presents to day for evaluation of acute concern(s).    History of Present Illness [name] initially presented with symptoms of sinus pressure, severe headache, and pain that began around Thanksgiving. Despite two visits to urgent care and treatment with Levofloxacin , Augmentin  and two rounds of prednisone , the symptoms persisted. [name] reported that the sinus symptoms were not new but had changed in nature over the last few years. Previously, sinus infections would result in thin mucus drainage from the nose, but now the patient experiences swelling and pressure, with mucus unable to drain properly. The patient also reported a severe headache, describing it as if her eye was going to explode with the current infection. She has been managing her symptoms with various medications, including prednisone , which she reported caused her to feel hyper-agitated and warm. She did take a hydrocodone  from an old prescription at one point due to the severe pain in her sinuses and head that was not relieved with anything else. She reported that she has been sleeping elevated due to the sinus pressure and has been experiencing hot flashes, which she was unsure if it was due to the prednisone  or a return of menopausal symptoms.   In addition to the sinus symptoms [name] reported chest pains, which she associated with physical activity. 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 The chest pain was described as radiating from the left  chest into the shoulder blade, jaw, and back gums, and was occasionally severe enough to consider calling an ambulance. The patient also reported feeling exhausted all the time. She reported not seeing cardiology  for several years, although, she has had a few tests read by Dr. Pietro. She is not currently experiencing any symptoms.     [name]'s stress levels were reported to be high due to personal and family issues. She is not sure if these are a contributor to the chest pain. She reported taking Xanax  to manage stress and anxiety and reported that the chest tightness seemed to improve with rest and Xanax .   She reported that decongestants, in combination with her thyroid  medication, caused her heart to go bananas. The patient also reported a history of costochondritis, which she believes may be contributing to her chest pain.  Situational anxiety     High stress levels due to family issues and health concerns. Occasional use of Xanax  for panic moments. Symptoms include chest tightness and difficulty breathing, which improve with rest and Xanax . Discussed increased dosage of Xanax , including risks of dependency and benefits of anxiety relief. Encouraged stress management techniques and consideration of counseling. - Increase Xanax  dosage to 0.5 mg daily, with the option to take a full dose during panic moments - Encourage stress management techniques and consider counseling        Relevant Medications    ALPRAZolam  (XANAX ) 0.5 MG tablet    Acute recurrent pansinusitis - Primary    Persistent sinus symptoms including pain, pressure, and severe headache since Thanksgiving. Symptoms have not improved with Augmentin  and prednisone . Noted swelling and pressure in the sinuses, particularly on the left side. Previous treatment with Levofloxacin  provided partial relief. Significant discomfort and nasal obstruction reported. Discussed potential need for endoscopic examination by ENT, including risks and benefits. - Refer to ENT for further evaluation and possible endoscopic examination - Prescribe Levofloxacin  - Discontinue prednisone  due to adverse effects        Relevant Medications    levofloxacin   (LEVAQUIN ) 500 MG tablet    Other Relevant Orders    Ambulatory referral to ENT    Chest pain on exertion    Intermittent chest pain exacerbated by activity and stress. 65% coronary artery blockage noted in August. Symptoms include pain radiating to the jaw and back teeth, and occasional tightness. Differential diagnosis includes musculoskeletal pain, anxiety, and cardiac etiology. Discussed use of nitroglycerin  for chest pain, including risks and benefits. Need for cardiology follow-up.  - Prescribe nitroglycerin  for chest pain episodes - Refer to cardiologist (Dr. Pietro) for further evaluation - Advise monitoring symptoms and seeking immediate care if pain persists or worsens     Moderate persistent asthma without compication      Past Medical History:  Diagnosis Date   Abdominal bloating 11/05/2020   Abnormality of right breast on screening mammogram 10/14/2019   Needs further views of right breast.  Breast center to call.       Achilles tendon contracture, right    Acute bilateral low back pain without sciatica 08/04/2006   Qualifier: Diagnosis of   By: Nunzio, RN, Dagoberto Caldron        Acute non-recurrent frontal sinusitis 12/24/2019   Acute recurrent pansinusitis 08/04/2020   ALLERGIC RHINITIS 04/10/2008   Allergic rhinitis 04/10/2008   Qualifier: Diagnosis of  By: Jame  MD, Maude FALCON    Asthma    asthmatic bronchitis   Breast lump in female    Carpal tunnel syndrome  of right wrist    Cellulitis 10/31/2019   Chronic right shoulder pain 05/07/2019   DEPRESSION 08/13/2008   Diabetes mellitus without complication (HCC)    Fatigue 11/17/2015   Fatty liver disease, nonalcoholic 03/29/2013   Hepatitis    fatty liver   HYPERLIPIDEMIA 08/04/2006   HYPERTENSION 08/04/2006   HYPOTHYROIDISM 08/04/2006   Insomnia 10/31/2019   Kidney injury    LOW BACK PAIN 08/04/2006   Moderate persistent asthma with (acute) exacerbation 06/27/2019   Neuromuscular disorder (HCC)    Pain of  finger of right hand 02/09/2021   Patellofemoral syndrome, right 06/19/2019   PERIMENOPAUSAL SYNDROME 05/08/2008   Plantar fasciitis of right foot 04/12/2017   Plantar fasciitis, left 11/17/2015   Postmenopausal bleeding 07/17/2019   Biopsy and US  c/w endometrial polyp.  Pt to see Dr. Nicholaus for surgical consult.     Primary insomnia 10/31/2019   PVD (peripheral vascular disease) (HCC)    occlusive, status post bifem bypass 2007   SOB (shortness of breath) 07/12/2016   Vaginal candidiasis 10/31/2019    Past Surgical History:  Procedure Laterality Date   CARPAL TUNNEL RELEASE Right 04/05/2016   Procedure: OPEN RIGHT CARPAL TUNNEL RELEASE;  Surgeon: Lynwood FORBES Better, MD;  Location: [facility];  Service: Orthopedics;  Laterality: Right;   CHOLECYSTECTOMY     FEMORAL BYPASS     bifem.   HAGLAND'S DEFORMITY EXCISION     PLANTAR FASCIA RELEASE Right 04/18/2018   Procedure: PLANTAR FASCIA RELEASE AND GASTROCNEMIUS RECESSION RIGHT;  Surgeon: Harden Jerona GAILS, MD;  Location: [facility] OR;  Service: Orthopedics;  Laterality: Right;   TUBAL LIGATION      Family History  Problem Relation Age of Onset   Hypertension Mother    Heart disease Father    Hyperlipidemia Father    Hypertension Father    Alcohol abuse Brother    Diabetes Maternal Grandmother    Alcohol abuse Maternal Uncle    Stroke Maternal Uncle    Ovarian cancer Daughter     Social History:  reports that she quit smoking about 19 years ago. Her smoking use included cigarettes. She has never used smokeless tobacco. She reports current alcohol use. She reports that she does not use drugs.  Allergies:  Allergies  Allergen Reactions   Influenza Virus Vacc Split Pf Swelling and Other (See Comments)    Swelling around injection site   Lactose Nausea And Vomiting   Lipitor [Atorvastatin ] Other (See Comments)    Hepatis     Medications: I have reviewed the patient's current medications.  The PMH, PSH, Medications,  Allergies, and SH were reviewed and updated.  ROS: Constitutional: Negative for fever, weight loss and weight gain. Cardiovascular: Negative for chest pain and dyspnea on exertion. Respiratory: Is not experiencing shortness of breath at rest. Gastrointestinal: Negative for nausea and vomiting. Neurological: Negative for headaches. Psychiatric: The patient is not nervous/anxious  Blood pressure 135/80, pulse 72, height 5' 4 (1.626 m), weight 193 lb (87.5 kg), SpO2 100%.  PHYSICAL EXAM:  Exam: General: Well-developed, well-nourished Communication and Voice: slightly raspy Respiratory Respiratory effort: Equal inspiration and expiration without stridor Cardiovascular Peripheral Vascular: Warm extremities with equal color/perfusion Eyes: No nystagmus with equal extraocular motion bilaterally Neuro/Psych/Balance: Patient oriented to person, place, and time; Appropriate mood and affect; Gait is intact with no imbalance; Cranial nerves I-XII are intact Head and Face Inspection: Normocephalic and atraumatic without mass or lesion Palpation: Facial skeleton intact without bony stepoffs Salivary Glands: No mass or  tenderness Facial Strength: Facial motility symmetric and full bilaterally ENT Pinna: External ear intact and fully developed External canal: Canal is patent with intact skin Tympanic Membrane: Clear and mobile External Nose: No scar or anatomic deformity Internal Nose: Septum is deviated with S-shaped septum. No polyp, or purulence. Mucosal edema and erythema present.  Bilateral inferior turbinate hypertrophy.  Lips, Teeth, and gums: Mucosa and teeth intact and viable TMJ: No pain to palpation with full mobility Oral cavity/oropharynx: No erythema or exudate, no lesions present Nasopharynx: No mass or lesion with intact mucosa Neck Neck and Trachea: Midline trachea without mass or lesion Thyroid : No mass or nodularity Lymphatics: No  lymphadenopathy  Procedure:   PROCEDURE NOTE: nasal endoscopy  Preoperative diagnosis: chronic sinusitis symptoms  Postoperative diagnosis: same  Procedure: Diagnostic nasal endoscopy (68768)  Surgeon: Elena Larry, M.D.  Anesthesia: Topical lidocaine  and Afrin  H&P REVIEW: The patient's history and physical were reviewed today prior to procedure. All medications were reviewed and updated as well. Complications: None Condition is stable throughout exam Indications and consent: The patient presents with symptoms of chronic sinusitis not responding to previous therapies. All the risks, benefits, and potential complications were reviewed with the patient preoperatively and informed consent was obtained. The time out was completed with confirmation of the correct procedure.   Procedure: The patient was seated upright in the clinic. Topical lidocaine  and Afrin were applied to the nasal cavity. After adequate anesthesia had occurred, the rigid nasal endoscope was passed into the nasal cavity. The nasal mucosa, turbinates, septum, and sinus drainage pathways were visualized bilaterally. This revealed no purulence or significant secretions that might be cultured. There were no polyps or sites of significant inflammation. The mucosa was intact and there was no crusting present. The scope was then slowly withdrawn and the patient tolerated the procedure well. There were no complications or blood loss.  Studies Reviewed:TSH   Assessment/Plan: Encounter Diagnoses  Name Primary?   Chronic sinusitis, unspecified location    Chronic nasal congestion Yes   Environmental and seasonal allergies    Nasal septal deviation    Hypertrophy of both inferior nasal turbinates     Assessment and Plan    Chronic Sinusitis symptoms Chronic sinusitis with recurrent infections approximately every other month for the past few years. Recent exacerbation started around Thanksgiving with severe pressure,  headache, and ear pressure. Previous treatments included multiple courses of antibiotics (Levofloxacin , Augmentin ) and steroids. Current symptoms include pressure without significant nasal discharge. Physical exam revealed septal deviation and crusting, but no polyps or pus. Differential includes aerosinusitis due to environmental pressure changes vs chronic sinusitis. Discussed saline rinses, Zyrtec , and Flonase . Further antibiotics and steroids not indicated. Emphasized optimizing symptom control and the impact of environmental pressure changes. - Order CT scan of sinuses - Recommend saline rinses with NeilMed bottle using distilled water - Prescribe Zyrtec  10 mg daily to be taken at night - Prescribe Flonase   2 puffs b/l nares twice a day - Advise to stop Afrin use - Recommend taking Motrin for discomfort - Consider Mucinex  for mucus management  Asthma Asthma managed with Breo and another inhaler. Last exacerbation in the summer, managed with an extra dose of Breo and Xanax . Currently well-controlled.  Type 2 Diabetes Mellitus Type 2 diabetes managed with Mounjaro . Significant weight loss of 40 pounds over the past year. Improved liver enzymes and A1c levels.   Graves' Disease (status post radioactive iodine ablation) Graves' disease treated with radioactive iodine approximately 30 years ago. TSH normal  3 mo ago.  - continue thyroid  hormone supplementation  General Health Maintenance None - Schedule follow-up appointment after CT scan of the sinuses.      Thank you for allowing me to participate in the care of this patient. Please do not hesitate to contact me with any questions or concerns.   Elena Larry, MD Otolaryngology [facility] Phone: [phone] Fax: [phone]   02/16/2023, 8:15 PM

## 2023-02-18 ENCOUNTER — Other Ambulatory Visit: Payer: Self-pay | Admitting: Nurse Practitioner

## 2023-02-18 DIAGNOSIS — E890 Postprocedural hypothyroidism: Secondary | ICD-10-CM

## 2023-02-20 ENCOUNTER — Other Ambulatory Visit (HOSPITAL_BASED_OUTPATIENT_CLINIC_OR_DEPARTMENT_OTHER): Payer: Self-pay

## 2023-02-20 MED ORDER — LEVOTHYROXINE SODIUM 112 MCG PO TABS
112.0000 ug | ORAL_TABLET | Freq: Every day | ORAL | 1 refills | Status: DC
  Filled 2023-02-28: qty 90, 90d supply, fill #0
  Filled 2023-06-23: qty 30, 30d supply, fill #1
  Filled 2023-08-10: qty 30, 30d supply, fill #2
  Filled 2023-10-01: qty 30, 30d supply, fill #3

## 2023-02-27 ENCOUNTER — Other Ambulatory Visit: Payer: Self-pay

## 2023-02-27 ENCOUNTER — Ambulatory Visit: Payer: BC Managed Care – PPO | Attending: Cardiovascular Disease | Admitting: Cardiovascular Disease

## 2023-02-27 ENCOUNTER — Encounter: Payer: Self-pay | Admitting: Cardiovascular Disease

## 2023-02-27 VITALS — BP 120/72 | HR 78 | Ht 64.0 in | Wt 198.6 lb

## 2023-02-27 DIAGNOSIS — I6523 Occlusion and stenosis of bilateral carotid arteries: Secondary | ICD-10-CM

## 2023-02-27 DIAGNOSIS — E119 Type 2 diabetes mellitus without complications: Secondary | ICD-10-CM

## 2023-02-27 DIAGNOSIS — E785 Hyperlipidemia, unspecified: Secondary | ICD-10-CM

## 2023-02-27 DIAGNOSIS — R079 Chest pain, unspecified: Secondary | ICD-10-CM

## 2023-02-27 DIAGNOSIS — I10 Essential (primary) hypertension: Secondary | ICD-10-CM

## 2023-02-27 DIAGNOSIS — I739 Peripheral vascular disease, unspecified: Secondary | ICD-10-CM

## 2023-02-27 DIAGNOSIS — E1169 Type 2 diabetes mellitus with other specified complication: Secondary | ICD-10-CM

## 2023-02-27 DIAGNOSIS — I779 Disorder of arteries and arterioles, unspecified: Secondary | ICD-10-CM | POA: Insufficient documentation

## 2023-02-27 NOTE — Patient Instructions (Addendum)
 Medication Instructions:  Your physician recommends that you continue on your current medications as directed. Please refer to the Current Medication list given to you today.  *If you need a refill on your cardiac medications before your next appointment, please call your pharmacy*   Lab Work: Your physician recommends that you have labs drawn today: BMET & CBC  If you have labs (blood work) drawn today and your tests are completely normal, you will receive your results only by: MyChart Message (if you have MyChart) OR A paper copy in the mail If you have any lab test that is abnormal or we need to change your treatment, we will call you to review the results.   Testing/Procedures: Your physician has requested that you have an echocardiogram. Echocardiography is a painless test that uses sound waves to create images of your heart. It provides your doctor with information about the size and shape of your heart and how well your heart's chambers and valves are working. This procedure takes approximately one hour. There are no restrictions for this procedure. Please do NOT wear cologne, perfume, aftershave, or lotions (deodorant is allowed). Please arrive 15 minutes prior to your appointment time.  Please note: We ask at that you not bring children with you during ultrasound (echo/ vascular) testing. Due to room size and safety concerns, children are not allowed in the ultrasound rooms during exams. Our front office staff cannot provide observation of children in our lobby area while testing is being conducted. An adult accompanying a patient to their appointment will only be allowed in the ultrasound room at the discretion of the ultrasound technician under special circumstances. We apologize for any inconvenience.     Follow-Up: At [facility], you and your health needs are our priority.  As part of our continuing mission to provide you with exceptional heart care, we have created  designated Provider Care Teams.  These Care Teams include your primary Cardiologist (physician) and Advanced Practice Providers (APPs -  Physician Assistants and Nurse Practitioners) who all work together to provide you with the care you need, when you need it.  We recommend signing up for the patient portal called MyChart.  Sign up information is provided on this After Visit Summary.  MyChart is used to connect with patients for Virtual Visits (Telemedicine).  Patients are able to view lab/test results, encounter notes, upcoming appointments, etc.  Non-urgent messages can be sent to your provider as well.   To learn more about what you can do with MyChart, go to [email or web address].    Your next appointment:   2-3 week(s) after your procedure (1/20)  Provider:   Dorn Lesches, MD    Other Instructions       Cardiac/Peripheral Catheterization   You are scheduled for a Cardiac Catheterization on Monday, January 20 with Dr. Lonni End.  1. Please arrive at the [facility] (Main Entrance A) at [facility]: [address] [place], [place] [address] at 5:30 AM (This time is 2 hour(s) before your procedure to ensure your preparation).   Free valet parking service is available. You will check in at ADMITTING. The support person will be asked to wait in the waiting room.  It is OK to have someone drop you off and come back when you are ready to be discharged.        Special note: Every effort is made to have your procedure done on time. Please understand that emergencies sometimes delay scheduled procedures.  2. Diet: Do not eat solid foods after midnight.  You may have clear liquids until 5 AM the day of the procedure.  3. Labs: You will need to have blood drawn today (1/13).  4. Medication instructions in preparation for your procedure:  **Hold mounjaro  for 1 week prior to heart cath.  On the morning of your procedure, take Aspirin  81 mg and any morning medicines  NOT listed above.  You may use sips of water.  5. Plan to go home the same day, you will only stay overnight if medically necessary. 6. You MUST have a responsible adult to drive you home. 7. An adult MUST be with you the first 24 hours after you arrive home. 8. Bring a current list of your medications, and the last time and date medication taken. 9. Bring ID and current insurance cards. 10.Please wear clothes that are easy to get on and off and wear slip-on shoes.  Thank you for allowing us  to care for you!   -- [facility]

## 2023-02-27 NOTE — Assessment & Plan Note (Signed)
 History of essential hypertension her blood pressure measured today at 120/72.  She is on metoprolol.

## 2023-02-27 NOTE — Assessment & Plan Note (Signed)
 History of hyperlipidemia on high-dose rosuvastatin with lipid profile performed 11/10/2022 revealing a total cholesterol of 132, LDL 63 and HDL 49.

## 2023-02-27 NOTE — Assessment & Plan Note (Signed)
 History of PAD status post aortobifemoral bypass grafting by Dr. Madilyn Fireman in 2007.  She denies claudication.  This apparently has been followed by her PCP.

## 2023-02-27 NOTE — Assessment & Plan Note (Signed)
 History of carotid artery disease with known moderate right ICA stenosis by duplex ultrasound 10/07/2022.

## 2023-02-27 NOTE — Progress Notes (Signed)
 02/27/2023 [name]   1960-10-18  [id number]  Primary Physician Early, Sung Amabile, NP Primary Cardiologist: Runell Gess MD Nicholes Calamity, [place]  HPI:  [name] is a 63 y.o. 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  She was last seen by Dr. Jens Som 8//21 at which time she had a negative stress test.  Her risk factors include remote tobacco abuse having quit 20 years ago with 25 pack years.  She has treated hypertension, diabetes and hyperlipidemia.  Her father died at age 53 of a myocardial infarction.  She is never had a second stroke.  She developed chest pain approximately 6 months ago occurring several times a month characterized as pain in her chest rating to her jaw and left upper extremity.  She also has a history of PAD status post aortobifemoral bypass grafting by Dr. Madilyn Fireman in 2007.   Current Meds  Medication Sig   acetaminophen-codeine (TYLENOL #3) 300-30 MG tablet Take 1-2 tablets by mouth every 6 (six) hours as needed for severe pain (pain score 7-10).   albuterol (PROVENTIL) (2.5 MG/3ML) 0.083% nebulizer solution Take 3 mLs (2.5 mg total) by nebulization every 6 (six) hours as needed for wheezing or shortness of breath.   albuterol (VENTOLIN HFA) 108 (90 Base) MCG/ACT inhaler Inhale 1-2 puffs into the lungs every 4 (four) hours as needed for wheezing or shortness of breath.   Albuterol-Budesonide (AIRSUPRA) 90-80 MCG/ACT AERO Inhale 1-2 Inhalations into the lungs every 6 (six) hours as needed (Wheezing, cough, shortness of breath).   ALPRAZolam (XANAX) 0.5 MG tablet Take 0.5-1 tablets (0.25-0.5 mg total) by mouth 2 (two) times daily as needed for anxiety.   Azelaic Acid 15 % gel After skin is thoroughly washed and patted dry, gently but thoroughly massage a thin film of azelaic acid cream into the affected area twice  daily, in the morning and evening.   B Complex Vitamins (VITAMIN B COMPLEX PO) Take 1 tablet by mouth daily.   benazepril (LOTENSIN) 10 MG tablet Take 1 tablet (10 mg total) by mouth daily.   celecoxib (CELEBREX) 200 MG capsule Take 1 capsule (200 mg total) by mouth daily.   cetirizine (ZYRTEC) 10 MG tablet Take 1 tablet (10 mg total) by mouth daily.   Continuous Glucose Receiver (DEXCOM G7 RECEIVER) DEVI For use with Dexcom G7 sensor   Continuous Glucose Sensor (DEXCOM G7 SENSOR) MISC Apply new sensor every 10 days for continuous glucose monitoring.   fluticasone (FLONASE) 50 MCG/ACT nasal spray Place 2 sprays into both nostrils 2 (two) times daily.   fluticasone furoate-vilanterol (BREO ELLIPTA) 100-25 MCG/ACT AEPB Inhale 1 puff into the lungs once daily.   levofloxacin (LEVAQUIN) 500 MG tablet Take 1 tablet (500 mg total) by mouth daily.   levothyroxine (SYNTHROID) 112 MCG tablet Take 1 tablet (112 mcg total) by mouth daily.   Menthol, Topical Analgesic, (ICY HOT EX) Apply 1 application topically daily as needed (pain).   metoprolol succinate (TOPROL-XL) 100 MG 24 hr tablet Take 1 tablet (100 mg total) by mouth daily. Take with or immediately following a meal.   nitroGLYCERIN (NITROSTAT) 0.4 MG SL tablet Place 1 tablet (0.4 mg total) under the tongue every 5 (five) minutes as needed for chest pain. Max three doses in a row. Proceed to ED for evaluation if no improvement.   pantoprazole (  PROTONIX) 40 MG tablet Take 1 tablet (40 mg total) by mouth daily.   rosuvastatin (CRESTOR) 40 MG tablet Take 1 tablet (40 mg total) by mouth daily.   Tiotropium Bromide Monohydrate (SPIRIVA RESPIMAT) 2.5 MCG/ACT AERS Inhale 2 puffs into the lungs once daily.   tirzepatide (MOUNJARO) 12.5 MG/0.5ML Pen Inject 12.5 mg into the skin once a week.   venlafaxine XR (EFFEXOR-XR) 75 MG 24 hr capsule Take 3 capsules (225 mg total) by mouth daily with breakfast.   Vitamin D, Ergocalciferol, (DRISDOL) 1.25 MG (50000 UNIT)  CAPS capsule Take 1 capsule (50,000 Units total) by mouth every 7 (seven) days.     Allergies  Allergen Reactions   Influenza Virus Vacc Split Pf Swelling and Other (See Comments)    Swelling around injection site   Lactose Nausea And Vomiting   Lipitor [Atorvastatin] Other (See Comments)    Hepatis     Social History   Socioeconomic History   Marital status: Married    Spouse name: Not on file   Number of children: 3   Years of education: Not on file   Highest education level: Not on file  Occupational History   Not on file  Tobacco Use   Smoking status: Former    Current packs/day: 0.00    Types: Cigarettes    Quit date: 02/15/2004    Years since quitting: 19.0   Smokeless tobacco: Never  Vaping Use   Vaping status: Never Used  Substance and Sexual Activity   Alcohol use: Yes    Comment: occasional   Drug use: No   Sexual activity: Not Currently  Other Topics Concern   Not on file  Social History Narrative   Not on file   Social Drivers of Health   Financial Resource Strain: Not on file  Food Insecurity: Not on file  Transportation Needs: Not on file  Physical Activity: Not on file  Stress: Not on file  Social Connections: Unknown (06/28/2021)   Received from [facility], [facility]   Social Network    Social Network: Not on file  Intimate Partner Violence: Unknown (05/20/2021)   Received from [facility], [facility]   HITS    Physically Hurt: Not on file    Insult or Talk Down To: Not on file    Threaten Physical Harm: Not on file    Scream or Curse: Not on file     Review of Systems: General: negative for chills, fever, night sweats or weight changes.  Cardiovascular: negative for chest pain, dyspnea on exertion, edema, orthopnea, palpitations, paroxysmal nocturnal dyspnea or shortness of breath Dermatological: negative for rash Respiratory: negative for cough or wheezing Urologic: negative for hematuria Abdominal: negative for nausea,  vomiting, diarrhea, bright red blood per rectum, melena, or hematemesis Neurologic: negative for visual changes, syncope, or dizziness All other systems reviewed and are otherwise negative except as noted above.    Blood pressure 120/72, pulse 78, height 5\' 4"  (1.626 m), weight 198 lb 9.6 oz (90.1 kg), SpO2 98%.  General appearance: alert and no distress Neck: no adenopathy, no JVD, supple, symmetrical, trachea midline, thyroid not enlarged, symmetric, no tenderness/mass/nodules, and left carotid bruit Lungs: clear to auscultation bilaterally Heart: regular rate and rhythm, S1, S2 normal, no murmur, click, rub or gallop Extremities: extremities normal, atraumatic, no cyanosis or edema Pulses: 2+ and symmetric Skin: Skin color, texture, turgor normal. No rashes or lesions Neurologic: Grossly normal  EKG EKG Interpretation Date/Time:  Monday February 27 2023 14:14:54  EST Ventricular Rate:  78 PR Interval:  176 QRS Duration:  84 QT Interval:  386 QTC Calculation: 440 R Axis:   54  Text Interpretation: Normal sinus rhythm Normal ECG When compared with ECG of 18-Apr-2018 07:15, No significant change was found Confirmed by Nanetta Batty [phone]) on 02/27/2023 2:33:00 PM    ASSESSMENT AND PLAN:   Hyperlipidemia associated with type 2 diabetes mellitus (HCC) History of hyperlipidemia on high-dose rosuvastatin with lipid profile performed 11/10/2022 revealing a total cholesterol of 132, LDL 63 and HDL 49.  Hypertension complicating diabetes (HCC) History of essential hypertension her blood pressure measured today at 120/72.  She is on metoprolol.  Peripheral vascular disease (HCC) History of PAD status post aortobifemoral bypass grafting by Dr. Madilyn Fireman in 2007.  She denies claudication.  This apparently has been followed by her PCP.  Chest pain on exertion Patient has complained of exertional chest pain over the last 6 months occurring once or twice per month with radiation to her jaw and  left upper extremity.  Based on her symptoms, her history of vascular disease and risk factors I favor going directly to diagnostic coronary angiography.  I have reviewed the risks, indications, and alternatives to cardiac catheterization, possible angioplasty, and stenting with the patient. Risks include but are not limited to bleeding, infection, vascular injury, stroke, myocardial infection, arrhythmia, kidney injury, radiation-related injury in the case of prolonged fluoroscopy use, emergency cardiac surgery, and death. The patient understands the risks of serious complication is 1-2 in 1000 with diagnostic cardiac cath and 1-2% or less with angioplasty/stenting.    Carotid artery disease (HCC) History of carotid artery disease with known moderate right ICA stenosis by duplex ultrasound 10/07/2022.     Runell Gess MD FACP,FACC,FAHA, [facility] 02/27/2023 2:48 PM

## 2023-02-27 NOTE — H&P (View-Only) (Signed)
02/27/2023 [name]   1960-10-18  [id number]  Primary Physician Early, Sung Amabile, NP Primary Cardiologist: Runell Gess MD Nicholes Calamity, [place]  HPI:  [name] is a 63 y.o. 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  She was last seen by Dr. Jens Som 8//21 at which time she had a negative stress test.  Her risk factors include remote tobacco abuse having quit 20 years ago with 25 pack years.  She has treated hypertension, diabetes and hyperlipidemia.  Her father died at age 53 of a myocardial infarction.  She is never had a second stroke.  She developed chest pain approximately 6 months ago occurring several times a month characterized as pain in her chest rating to her jaw and left upper extremity.  She also has a history of PAD status post aortobifemoral bypass grafting by Dr. Madilyn Fireman in 2007.   Current Meds  Medication Sig   acetaminophen-codeine (TYLENOL #3) 300-30 MG tablet Take 1-2 tablets by mouth every 6 (six) hours as needed for severe pain (pain score 7-10).   albuterol (PROVENTIL) (2.5 MG/3ML) 0.083% nebulizer solution Take 3 mLs (2.5 mg total) by nebulization every 6 (six) hours as needed for wheezing or shortness of breath.   albuterol (VENTOLIN HFA) 108 (90 Base) MCG/ACT inhaler Inhale 1-2 puffs into the lungs every 4 (four) hours as needed for wheezing or shortness of breath.   Albuterol-Budesonide (AIRSUPRA) 90-80 MCG/ACT AERO Inhale 1-2 Inhalations into the lungs every 6 (six) hours as needed (Wheezing, cough, shortness of breath).   ALPRAZolam (XANAX) 0.5 MG tablet Take 0.5-1 tablets (0.25-0.5 mg total) by mouth 2 (two) times daily as needed for anxiety.   Azelaic Acid 15 % gel After skin is thoroughly washed and patted dry, gently but thoroughly massage a thin film of azelaic acid cream into the affected area twice  daily, in the morning and evening.   B Complex Vitamins (VITAMIN B COMPLEX PO) Take 1 tablet by mouth daily.   benazepril (LOTENSIN) 10 MG tablet Take 1 tablet (10 mg total) by mouth daily.   celecoxib (CELEBREX) 200 MG capsule Take 1 capsule (200 mg total) by mouth daily.   cetirizine (ZYRTEC) 10 MG tablet Take 1 tablet (10 mg total) by mouth daily.   Continuous Glucose Receiver (DEXCOM G7 RECEIVER) DEVI For use with Dexcom G7 sensor   Continuous Glucose Sensor (DEXCOM G7 SENSOR) MISC Apply new sensor every 10 days for continuous glucose monitoring.   fluticasone (FLONASE) 50 MCG/ACT nasal spray Place 2 sprays into both nostrils 2 (two) times daily.   fluticasone furoate-vilanterol (BREO ELLIPTA) 100-25 MCG/ACT AEPB Inhale 1 puff into the lungs once daily.   levofloxacin (LEVAQUIN) 500 MG tablet Take 1 tablet (500 mg total) by mouth daily.   levothyroxine (SYNTHROID) 112 MCG tablet Take 1 tablet (112 mcg total) by mouth daily.   Menthol, Topical Analgesic, (ICY HOT EX) Apply 1 application topically daily as needed (pain).   metoprolol succinate (TOPROL-XL) 100 MG 24 hr tablet Take 1 tablet (100 mg total) by mouth daily. Take with or immediately following a meal.   nitroGLYCERIN (NITROSTAT) 0.4 MG SL tablet Place 1 tablet (0.4 mg total) under the tongue every 5 (five) minutes as needed for chest pain. Max three doses in a row. Proceed to ED for evaluation if no improvement.   pantoprazole (  PROTONIX) 40 MG tablet Take 1 tablet (40 mg total) by mouth daily.   rosuvastatin (CRESTOR) 40 MG tablet Take 1 tablet (40 mg total) by mouth daily.   Tiotropium Bromide Monohydrate (SPIRIVA RESPIMAT) 2.5 MCG/ACT AERS Inhale 2 puffs into the lungs once daily.   tirzepatide (MOUNJARO) 12.5 MG/0.5ML Pen Inject 12.5 mg into the skin once a week.   venlafaxine XR (EFFEXOR-XR) 75 MG 24 hr capsule Take 3 capsules (225 mg total) by mouth daily with breakfast.   Vitamin D, Ergocalciferol, (DRISDOL) 1.25 MG (50000 UNIT)  CAPS capsule Take 1 capsule (50,000 Units total) by mouth every 7 (seven) days.     Allergies  Allergen Reactions   Influenza Virus Vacc Split Pf Swelling and Other (See Comments)    Swelling around injection site   Lactose Nausea And Vomiting   Lipitor [Atorvastatin] Other (See Comments)    Hepatis     Social History   Socioeconomic History   Marital status: Married    Spouse name: Not on file   Number of children: 3   Years of education: Not on file   Highest education level: Not on file  Occupational History   Not on file  Tobacco Use   Smoking status: Former    Current packs/day: 0.00    Types: Cigarettes    Quit date: 02/15/2004    Years since quitting: 19.0   Smokeless tobacco: Never  Vaping Use   Vaping status: Never Used  Substance and Sexual Activity   Alcohol use: Yes    Comment: occasional   Drug use: No   Sexual activity: Not Currently  Other Topics Concern   Not on file  Social History Narrative   Not on file   Social Drivers of Health   Financial Resource Strain: Not on file  Food Insecurity: Not on file  Transportation Needs: Not on file  Physical Activity: Not on file  Stress: Not on file  Social Connections: Unknown (06/28/2021)   Received from [facility], [facility]   Social Network    Social Network: Not on file  Intimate Partner Violence: Unknown (05/20/2021)   Received from [facility], [facility]   HITS    Physically Hurt: Not on file    Insult or Talk Down To: Not on file    Threaten Physical Harm: Not on file    Scream or Curse: Not on file     Review of Systems: General: negative for chills, fever, night sweats or weight changes.  Cardiovascular: negative for chest pain, dyspnea on exertion, edema, orthopnea, palpitations, paroxysmal nocturnal dyspnea or shortness of breath Dermatological: negative for rash Respiratory: negative for cough or wheezing Urologic: negative for hematuria Abdominal: negative for nausea,  vomiting, diarrhea, bright red blood per rectum, melena, or hematemesis Neurologic: negative for visual changes, syncope, or dizziness All other systems reviewed and are otherwise negative except as noted above.    Blood pressure 120/72, pulse 78, height 5\' 4"  (1.626 m), weight 198 lb 9.6 oz (90.1 kg), SpO2 98%.  General appearance: alert and no distress Neck: no adenopathy, no JVD, supple, symmetrical, trachea midline, thyroid not enlarged, symmetric, no tenderness/mass/nodules, and left carotid bruit Lungs: clear to auscultation bilaterally Heart: regular rate and rhythm, S1, S2 normal, no murmur, click, rub or gallop Extremities: extremities normal, atraumatic, no cyanosis or edema Pulses: 2+ and symmetric Skin: Skin color, texture, turgor normal. No rashes or lesions Neurologic: Grossly normal  EKG EKG Interpretation Date/Time:  Monday February 27 2023 14:14:54  EST Ventricular Rate:  78 PR Interval:  176 QRS Duration:  84 QT Interval:  386 QTC Calculation: 440 R Axis:   54  Text Interpretation: Normal sinus rhythm Normal ECG When compared with ECG of 18-Apr-2018 07:15, No significant change was found Confirmed by Nanetta Batty [phone]) on 02/27/2023 2:33:00 PM    ASSESSMENT AND PLAN:   Hyperlipidemia associated with type 2 diabetes mellitus (HCC) History of hyperlipidemia on high-dose rosuvastatin with lipid profile performed 11/10/2022 revealing a total cholesterol of 132, LDL 63 and HDL 49.  Hypertension complicating diabetes (HCC) History of essential hypertension her blood pressure measured today at 120/72.  She is on metoprolol.  Peripheral vascular disease (HCC) History of PAD status post aortobifemoral bypass grafting by Dr. Madilyn Fireman in 2007.  She denies claudication.  This apparently has been followed by her PCP.  Chest pain on exertion Patient has complained of exertional chest pain over the last 6 months occurring once or twice per month with radiation to her jaw and  left upper extremity.  Based on her symptoms, her history of vascular disease and risk factors I favor going directly to diagnostic coronary angiography.  I have reviewed the risks, indications, and alternatives to cardiac catheterization, possible angioplasty, and stenting with the patient. Risks include but are not limited to bleeding, infection, vascular injury, stroke, myocardial infection, arrhythmia, kidney injury, radiation-related injury in the case of prolonged fluoroscopy use, emergency cardiac surgery, and death. The patient understands the risks of serious complication is 1-2 in 1000 with diagnostic cardiac cath and 1-2% or less with angioplasty/stenting.    Carotid artery disease (HCC) History of carotid artery disease with known moderate right ICA stenosis by duplex ultrasound 10/07/2022.     Runell Gess MD FACP,FACC,FAHA, [facility] 02/27/2023 2:48 PM

## 2023-02-27 NOTE — Assessment & Plan Note (Signed)
 Patient has complained of exertional chest pain over the last 6 months occurring once or twice per month with radiation to her jaw and left upper extremity.  Based on her symptoms, her history of vascular disease and risk factors I favor going directly to diagnostic coronary angiography.  I have reviewed the risks, indications, and alternatives to cardiac catheterization, possible angioplasty, and stenting with the patient. Risks include but are not limited to bleeding, infection, vascular injury, stroke, myocardial infection, arrhythmia, kidney injury, radiation-related injury in the case of prolonged fluoroscopy use, emergency cardiac surgery, and death. The patient understands the risks of serious complication is 1-2 in 1000 with diagnostic cardiac cath and 1-2% or less with angioplasty/stenting.

## 2023-02-28 ENCOUNTER — Other Ambulatory Visit (HOSPITAL_BASED_OUTPATIENT_CLINIC_OR_DEPARTMENT_OTHER): Payer: Self-pay

## 2023-02-28 ENCOUNTER — Ambulatory Visit (HOSPITAL_BASED_OUTPATIENT_CLINIC_OR_DEPARTMENT_OTHER): Payer: BC Managed Care – PPO

## 2023-02-28 DIAGNOSIS — E1169 Type 2 diabetes mellitus with other specified complication: Secondary | ICD-10-CM | POA: Diagnosis not present

## 2023-02-28 DIAGNOSIS — E785 Hyperlipidemia, unspecified: Secondary | ICD-10-CM

## 2023-02-28 DIAGNOSIS — I10 Essential (primary) hypertension: Secondary | ICD-10-CM

## 2023-02-28 DIAGNOSIS — E119 Type 2 diabetes mellitus without complications: Secondary | ICD-10-CM | POA: Diagnosis not present

## 2023-02-28 DIAGNOSIS — R079 Chest pain, unspecified: Secondary | ICD-10-CM | POA: Diagnosis not present

## 2023-02-28 LAB — CBC WITH DIFFERENTIAL/PLATELET
Basophils Absolute: 0.1 10*3/uL (ref 0.0–0.2)
Basos: 1 %
EOS (ABSOLUTE): 0.3 10*3/uL (ref 0.0–0.4)
Eos: 5 %
Hematocrit: 43.8 % (ref 34.0–46.6)
Hemoglobin: 14.6 g/dL (ref 11.1–15.9)
Immature Grans (Abs): 0 10*3/uL (ref 0.0–0.1)
Immature Granulocytes: 0 %
Lymphocytes Absolute: 2.7 10*3/uL (ref 0.7–3.1)
Lymphs: 36 %
MCH: 30.4 pg (ref 26.6–33.0)
MCHC: 33.3 g/dL (ref 31.5–35.7)
MCV: 91 fL (ref 79–97)
Monocytes Absolute: 0.5 10*3/uL (ref 0.1–0.9)
Monocytes: 7 %
Neutrophils Absolute: 3.7 10*3/uL (ref 1.4–7.0)
Neutrophils: 51 %
Platelets: 158 10*3/uL (ref 150–450)
RBC: 4.81 x10E6/uL (ref 3.77–5.28)
RDW: 12.3 % (ref 11.7–15.4)
WBC: 7.3 10*3/uL (ref 3.4–10.8)

## 2023-02-28 LAB — BASIC METABOLIC PANEL
BUN/Creatinine Ratio: 23 (ref 12–28)
BUN: 19 mg/dL (ref 8–27)
CO2: 21 mmol/L (ref 20–29)
Calcium: 9.2 mg/dL (ref 8.7–10.3)
Chloride: 103 mmol/L (ref 96–106)
Creatinine, Ser: 0.82 mg/dL (ref 0.57–1.00)
Glucose: 108 mg/dL — ABNORMAL HIGH (ref 70–99)
Potassium: 3.9 mmol/L (ref 3.5–5.2)
Sodium: 140 mmol/L (ref 134–144)
eGFR: 81 mL/min/{1.73_m2} (ref >59)

## 2023-02-28 LAB — ECHOCARDIOGRAM COMPLETE
Area-P 1/2: 3.42 cm2
S' Lateral: 1.95 cm

## 2023-03-02 ENCOUNTER — Telehealth: Payer: Self-pay | Admitting: *Deleted

## 2023-03-02 NOTE — Telephone Encounter (Signed)
Cardiac Catheterization scheduled at [facility] for: Monday March 06, 2023 7:30 AM Arrival time [facility] Main Entrance A at: 5:30 AM  Nothing to eat after midnight prior to procedure, clear liquids until 5 AM day of procedure.  Medication instructions: -Hold:  Mounjaro-weekly on Sundays -will hold until post procedure -Other usual morning medications can be taken with sips of water including aspirin 81 mg.  Plan to go home the same day, you will only stay overnight if medically necessary.  You must have responsible adult to drive you home.  Someone must be with you the first 24 hours after you arrive home.  Reviewed procedure instructions with patient.

## 2023-03-03 ENCOUNTER — Other Ambulatory Visit (HOSPITAL_BASED_OUTPATIENT_CLINIC_OR_DEPARTMENT_OTHER): Payer: Self-pay

## 2023-03-04 ENCOUNTER — Other Ambulatory Visit (HOSPITAL_BASED_OUTPATIENT_CLINIC_OR_DEPARTMENT_OTHER): Payer: Self-pay

## 2023-03-06 ENCOUNTER — Ambulatory Visit (HOSPITAL_COMMUNITY)
Admission: RE | Admit: 2023-03-06 | Discharge: 2023-03-06 | Disposition: A | Discharge status: Home / Self Care | Payer: BC Managed Care – PPO | Attending: Internal Medicine | Admitting: Internal Medicine

## 2023-03-06 ENCOUNTER — Other Ambulatory Visit (HOSPITAL_BASED_OUTPATIENT_CLINIC_OR_DEPARTMENT_OTHER): Payer: Self-pay

## 2023-03-06 ENCOUNTER — Other Ambulatory Visit: Payer: Self-pay

## 2023-03-06 ENCOUNTER — Ambulatory Visit (HOSPITAL_COMMUNITY)
Admission: RE | Disposition: A | Discharge status: Home / Self Care | Payer: BC Managed Care – PPO | Source: Home / Self Care | Attending: Internal Medicine

## 2023-03-06 DIAGNOSIS — I10 Essential (primary) hypertension: Secondary | ICD-10-CM | POA: Insufficient documentation

## 2023-03-06 DIAGNOSIS — E1169 Type 2 diabetes mellitus with other specified complication: Secondary | ICD-10-CM | POA: Insufficient documentation

## 2023-03-06 DIAGNOSIS — I25110 Atherosclerotic heart disease of native coronary artery with unstable angina pectoris: Secondary | ICD-10-CM | POA: Insufficient documentation

## 2023-03-06 DIAGNOSIS — Z87891 Personal history of nicotine dependence: Secondary | ICD-10-CM | POA: Insufficient documentation

## 2023-03-06 DIAGNOSIS — Z79899 Other long term (current) drug therapy: Secondary | ICD-10-CM | POA: Insufficient documentation

## 2023-03-06 DIAGNOSIS — E785 Hyperlipidemia, unspecified: Secondary | ICD-10-CM | POA: Insufficient documentation

## 2023-03-06 DIAGNOSIS — R079 Chest pain, unspecified: Secondary | ICD-10-CM

## 2023-03-06 DIAGNOSIS — Z7985 Long-term (current) use of injectable non-insulin antidiabetic drugs: Secondary | ICD-10-CM | POA: Diagnosis not present

## 2023-03-06 DIAGNOSIS — I200 Unstable angina: Secondary | ICD-10-CM | POA: Diagnosis not present

## 2023-03-06 DIAGNOSIS — E1151 Type 2 diabetes mellitus with diabetic peripheral angiopathy without gangrene: Secondary | ICD-10-CM | POA: Insufficient documentation

## 2023-03-06 DIAGNOSIS — Z8249 Family history of ischemic heart disease and other diseases of the circulatory system: Secondary | ICD-10-CM | POA: Diagnosis not present

## 2023-03-06 DIAGNOSIS — I2584 Coronary atherosclerosis due to calcified coronary lesion: Secondary | ICD-10-CM | POA: Diagnosis not present

## 2023-03-06 HISTORY — PX: LEFT HEART CATH AND CORONARY ANGIOGRAPHY: CATH118249

## 2023-03-06 LAB — GLUCOSE, CAPILLARY
Glucose-Capillary: 132 mg/dL — ABNORMAL HIGH (ref 70–99)
Glucose-Capillary: 116 mg/dL — ABNORMAL HIGH (ref 70–99)

## 2023-03-06 SURGERY — LEFT HEART CATH AND CORONARY ANGIOGRAPHY
Anesthesia: LOCAL

## 2023-03-06 MED ORDER — SODIUM CHLORIDE 0.9 % WEIGHT BASED INFUSION: 1.0000 mL/kg/h | INTRAVENOUS | Status: DC

## 2023-03-06 MED ORDER — HEPARIN (PORCINE) IN NACL 2000-0.9 UNIT/L-% IV SOLN
INTRAVENOUS | Status: DC | PRN
  Administered 2023-03-06: 1000 mL

## 2023-03-06 MED ORDER — SODIUM CHLORIDE 0.9% FLUSH: 3.0000 mL | Freq: Two times a day (BID) | INTRAVENOUS | Status: DC

## 2023-03-06 MED ORDER — SODIUM CHLORIDE 0.9% FLUSH: 3.0000 mL | INTRAVENOUS | Status: DC | PRN

## 2023-03-06 MED ORDER — FENTANYL CITRATE (PF) 100 MCG/2ML IJ SOLN
INTRAMUSCULAR | Status: DC | PRN
  Administered 2023-03-06: 25 ug via INTRAVENOUS

## 2023-03-06 MED ORDER — HEPARIN SODIUM (PORCINE) 1000 UNIT/ML IJ SOLN
INTRAMUSCULAR | Status: AC
Start: 2023-03-06 — End: ?
  Filled 2023-03-06: qty 10

## 2023-03-06 MED ORDER — HEPARIN SODIUM (PORCINE) 1000 UNIT/ML IJ SOLN
INTRAMUSCULAR | Status: DC | PRN
  Administered 2023-03-06: 4500 [IU] via INTRAVENOUS

## 2023-03-06 MED ORDER — ACETAMINOPHEN 325 MG PO TABS: 650.0000 mg | ORAL_TABLET | ORAL | Status: DC | PRN

## 2023-03-06 MED ORDER — FENTANYL CITRATE (PF) 100 MCG/2ML IJ SOLN
INTRAMUSCULAR | Status: AC
  Filled 2023-03-06: qty 2

## 2023-03-06 MED ORDER — ISOSORBIDE MONONITRATE ER 30 MG PO TB24
15.0000 mg | ORAL_TABLET | Freq: Every day | ORAL | 5 refills | Status: DC
  Filled 2023-03-06: qty 30, 60d supply, fill #0

## 2023-03-06 MED ORDER — LABETALOL HCL 5 MG/ML IV SOLN: 10.0000 mg | INTRAVENOUS | Status: DC | PRN

## 2023-03-06 MED ORDER — ASPIRIN 81 MG PO TBEC: 81.0000 mg | DELAYED_RELEASE_TABLET | Freq: Once | ORAL | Status: DC

## 2023-03-06 MED ORDER — LIDOCAINE HCL (PF) 1 % IJ SOLN
INTRAMUSCULAR | Status: AC
  Filled 2023-03-06: qty 30

## 2023-03-06 MED ORDER — HYDRALAZINE HCL 20 MG/ML IJ SOLN: 10.0000 mg | INTRAMUSCULAR | Status: DC | PRN

## 2023-03-06 MED ORDER — SODIUM CHLORIDE 0.9 % IV SOLN: INTRAVENOUS | Status: DC

## 2023-03-06 MED ORDER — VERAPAMIL HCL 2.5 MG/ML IV SOLN
INTRAVENOUS | Status: AC
  Filled 2023-03-06: qty 2

## 2023-03-06 MED ORDER — MIDAZOLAM HCL 2 MG/2ML IJ SOLN
INTRAMUSCULAR | Status: DC | PRN
  Administered 2023-03-06: 1 mg via INTRAVENOUS

## 2023-03-06 MED ORDER — ONDANSETRON HCL 4 MG/2ML IJ SOLN: 4.0000 mg | Freq: Four times a day (QID) | INTRAMUSCULAR | Status: DC | PRN

## 2023-03-06 MED ORDER — SODIUM CHLORIDE 0.9 % IV SOLN
250.0000 mL | INTRAVENOUS | Status: DC | PRN
Start: 2023-03-06 — End: 2023-03-06

## 2023-03-06 MED ORDER — LIDOCAINE HCL (PF) 1 % IJ SOLN
INTRAMUSCULAR | Status: DC | PRN
  Administered 2023-03-06: 2 mL

## 2023-03-06 MED ORDER — MIDAZOLAM HCL 2 MG/2ML IJ SOLN
INTRAMUSCULAR | Status: AC
Start: 2023-03-06 — End: ?
  Filled 2023-03-06: qty 2

## 2023-03-06 MED ORDER — IOHEXOL 350 MG/ML SOLN
INTRAVENOUS | Status: DC | PRN
  Administered 2023-03-06: 40 mL

## 2023-03-06 MED ORDER — SODIUM CHLORIDE 0.9 % WEIGHT BASED INFUSION: 3.0000 mL/kg/h | INTRAVENOUS | Status: AC

## 2023-03-06 SURGICAL SUPPLY — 8 items
CATH 5FR JL3.5 JR4 ANG PIG MP (CATHETERS) IMPLANT
DEVICE RAD COMP TR BAND LRG (VASCULAR PRODUCTS) IMPLANT
GLIDESHEATH SLEND SS 6F .021 (SHEATH) IMPLANT
GUIDEWIRE INQWIRE 1.5J.035X260 (WIRE) IMPLANT
INQWIRE 1.5J .035X260CM (WIRE) ×1 IMPLANT
KIT SYRINGE INJ CVI SPIKEX1 (MISCELLANEOUS) IMPLANT
PACK CARDIAC CATHETERIZATION (CUSTOM PROCEDURE TRAY) ×1 IMPLANT
SET ATX-X65L (MISCELLANEOUS) IMPLANT

## 2023-03-06 NOTE — Brief Op Note (Signed)
BRIEF CARDIAC CATHETERIZATION NOTE  03/06/2023  8:39 AM  PATIENT:  [name]  63 y.o. female  PRE-OPERATIVE DIAGNOSIS:  Unstable angina  POST-OPERATIVE DIAGNOSIS:  Same  PROCEDURE:  Procedure(s): LEFT HEART CATH AND CORONARY ANGIOGRAPHY (N/A)  SURGEON:  Surgeons and Role:    * Porshea Janowski, Cristal Deer, MD - Primary  FINDINGS: Severe three-vessel CAD with heavy calcification. Normal LVEF with mildly elevated filling pressure.  RECOMMENDATIONS: Refer for cardiac surgery consultation for CABG. Add Imdur 15 mg daily. Continue aggressive secondary prevention.  Yvonne Kendall, MD [facility]

## 2023-03-06 NOTE — Interval H&P Note (Signed)
History and Physical Interval Note:  03/06/2023 7:14 AM  [name]  has presented today for surgery, with the diagnosis of unstable angina.  The various methods of treatment have been discussed with the patient and family. After consideration of risks, benefits and other options for treatment, the patient has consented to  Procedure(s): LEFT HEART CATH AND CORONARY ANGIOGRAPHY (N/A) as a surgical intervention.  The patient's history has been reviewed, patient examined, no change in status, stable for surgery.  I have reviewed the patient's chart and labs.  Questions were answered to the patient's satisfaction.    Cath Lab Visit (complete for each Cath Lab visit)  Clinical Evaluation Leading to the Procedure:   ACS: No.  Non-ACS:    Anginal Classification: CCS IV  Anti-ischemic medical therapy: Minimal Therapy (1 class of medications)  Non-Invasive Test Results: No non-invasive testing performed  Prior CABG: No previous CABG  Boots Mcglown

## 2023-03-07 ENCOUNTER — Encounter (HOSPITAL_COMMUNITY): Payer: Self-pay | Admitting: Internal Medicine

## 2023-03-07 MED FILL — Verapamil HCl IV Soln 2.5 MG/ML: INTRAVENOUS | Qty: 2 | Status: AC

## 2023-03-08 ENCOUNTER — Encounter: Payer: Self-pay | Admitting: Nurse Practitioner

## 2023-03-11 ENCOUNTER — Other Ambulatory Visit (HOSPITAL_BASED_OUTPATIENT_CLINIC_OR_DEPARTMENT_OTHER): Payer: Self-pay

## 2023-03-15 ENCOUNTER — Encounter: Payer: Self-pay | Admitting: Cardiovascular Disease

## 2023-03-15 ENCOUNTER — Ambulatory Visit: Payer: BC Managed Care – PPO | Attending: Cardiovascular Disease | Admitting: Cardiovascular Disease

## 2023-03-15 VITALS — BP 124/84 | HR 81 | Ht 64.0 in | Wt 195.2 lb

## 2023-03-15 DIAGNOSIS — E785 Hyperlipidemia, unspecified: Secondary | ICD-10-CM

## 2023-03-15 DIAGNOSIS — I739 Peripheral vascular disease, unspecified: Secondary | ICD-10-CM | POA: Diagnosis not present

## 2023-03-15 DIAGNOSIS — I2510 Atherosclerotic heart disease of native coronary artery without angina pectoris: Secondary | ICD-10-CM | POA: Insufficient documentation

## 2023-03-15 DIAGNOSIS — I6521 Occlusion and stenosis of right carotid artery: Secondary | ICD-10-CM

## 2023-03-15 DIAGNOSIS — E1169 Type 2 diabetes mellitus with other specified complication: Secondary | ICD-10-CM

## 2023-03-15 DIAGNOSIS — E119 Type 2 diabetes mellitus without complications: Secondary | ICD-10-CM | POA: Diagnosis not present

## 2023-03-15 DIAGNOSIS — I25110 Atherosclerotic heart disease of native coronary artery with unstable angina pectoris: Secondary | ICD-10-CM

## 2023-03-15 DIAGNOSIS — I10 Essential (primary) hypertension: Secondary | ICD-10-CM

## 2023-03-15 NOTE — Progress Notes (Signed)
03/15/2023 [name]   19-Sep-1960  [id number]  Primary Physician Early, Sung Amabile, NP Primary Cardiologist: Runell Gess MD Nicholes Calamity, [place]  HPI:  [name] is a 63 y.o.   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  She was last seen by Dr. Jens Som 09/18/19 at which time she had a negative stress test.  I last saw her in the office 02/27/2023 her risk factors include remote tobacco abuse having quit 20 years ago with 25 pack years.  She has treated hypertension, diabetes and hyperlipidemia.  Her father died at age 42 of a myocardial infarction.  She is never had a second stroke.  She developed chest pain approximately 6 months ago occurring several times a month characterized as pain in her chest rating to her jaw and left upper extremity.  She also has a history of PAD status post aortobifemoral bypass grafting by Dr. Madilyn Fireman in 2007.    Because of fairly classic symptoms of chest pain I recommended outpatient diagnostic cath which was performed 03/06/2023 by Dr. Okey Dupre radiantly that revealed three-vessel disease.  She had surgical anatomy and was referred for CABG.  She has an appoint with Dr. Cliffton Asters tomorrow to discuss.  She was placed on Imdur which she has taken since her cath that is resulted in marked improvement in her chest pain symptoms.     Current Meds  Medication Sig   acetaminophen-codeine (TYLENOL #3) 300-30 MG tablet Take 1-2 tablets by mouth every 6 (six) hours as needed for severe pain (pain score 7-10).   albuterol (VENTOLIN HFA) 108 (90 Base) MCG/ACT inhaler Inhale 1-2 puffs into the lungs every 4 (four) hours as needed for wheezing or shortness of breath.   ALPRAZolam (XANAX) 0.5 MG tablet Take 0.5-1 tablets (0.25-0.5 mg total) by mouth 2 (two) times daily as needed for anxiety.   aspirin EC 81 MG tablet Take 1  tablet (81 mg total) by mouth daily. Swallow whole.   Azelaic Acid 15 % gel After skin is thoroughly washed and patted dry, gently but thoroughly massage a thin film of azelaic acid cream into the affected area twice daily, in the morning and evening.   B Complex Vitamins (VITAMIN B COMPLEX PO) Take 1 tablet by mouth daily.   benazepril (LOTENSIN) 10 MG tablet Take 1 tablet (10 mg total) by mouth daily.   celecoxib (CELEBREX) 200 MG capsule Take 1 capsule (200 mg total) by mouth daily.   cetirizine (ZYRTEC) 10 MG tablet Take 1 tablet (10 mg total) by mouth daily.   Continuous Glucose Receiver (DEXCOM G7 RECEIVER) DEVI For use with Dexcom G7 sensor   Continuous Glucose Sensor (DEXCOM G7 SENSOR) MISC Apply new sensor every 10 days for continuous glucose monitoring.   fluticasone (FLONASE) 50 MCG/ACT nasal spray Place 2 sprays into both nostrils 2 (two) times daily. (Patient taking differently: Place 2 sprays into both nostrils 2 (two) times daily as needed for allergies.)   fluticasone furoate-vilanterol (BREO ELLIPTA) 100-25 MCG/ACT AEPB Inhale 1 puff into the lungs once daily.   isosorbide mononitrate (IMDUR) 30 MG 24 hr tablet Take 0.5 tablets (15 mg total) by mouth daily.   levothyroxine (SYNTHROID) 112 MCG tablet Take 1 tablet (112 mcg total) by mouth daily.   Menthol, Topical Analgesic, (ICY HOT EX) Apply 1 application topically daily as needed (  pain).   metoprolol succinate (TOPROL-XL) 100 MG 24 hr tablet Take 1 tablet (100 mg total) by mouth daily. Take with or immediately following a meal.   nitroGLYCERIN (NITROSTAT) 0.4 MG SL tablet Place 1 tablet (0.4 mg total) under the tongue every 5 (five) minutes as needed for chest pain. Max three doses in a row. Proceed to ED for evaluation if no improvement.   pantoprazole (PROTONIX) 40 MG tablet Take 1 tablet (40 mg total) by mouth daily.   rosuvastatin (CRESTOR) 40 MG tablet Take 1 tablet (40 mg total) by mouth daily.   Tiotropium Bromide  Monohydrate (SPIRIVA RESPIMAT) 2.5 MCG/ACT AERS Inhale 2 puffs into the lungs once daily.   tirzepatide (MOUNJARO) 12.5 MG/0.5ML Pen Inject 12.5 mg into the skin once a week.   venlafaxine XR (EFFEXOR-XR) 75 MG 24 hr capsule Take 3 capsules (225 mg total) by mouth daily with breakfast.     Allergies  Allergen Reactions   Influenza Virus Vacc Split Pf Swelling and Other (See Comments)    Swelling around injection site   Gluten Meal Diarrhea   Lipitor [Atorvastatin] Other (See Comments)    Hepatis     Social History   Socioeconomic History   Marital status: Married    Spouse name: Not on file   Number of children: 3   Years of education: Not on file   Highest education level: Not on file  Occupational History   Not on file  Tobacco Use   Smoking status: Former    Current packs/day: 0.00    Types: Cigarettes    Quit date: 02/15/2004    Years since quitting: 19.0   Smokeless tobacco: Never  Vaping Use   Vaping status: Never Used  Substance and Sexual Activity   Alcohol use: Yes    Comment: occasional   Drug use: No   Sexual activity: Not Currently  Other Topics Concern   Not on file  Social History Narrative   Not on file   Social Drivers of Health   Financial Resource Strain: Not on file  Food Insecurity: Not on file  Transportation Needs: Not on file  Physical Activity: Not on file  Stress: Not on file  Social Connections: Unknown (06/28/2021)   Received from [facility], [facility]   Social Network    Social Network: Not on file  Intimate Partner Violence: Unknown (05/20/2021)   Received from [facility], [facility]   HITS    Physically Hurt: Not on file    Insult or Talk Down To: Not on file    Threaten Physical Harm: Not on file    Scream or Curse: Not on file     Review of Systems: General: negative for chills, fever, night sweats or weight changes.  Cardiovascular: negative for chest pain, dyspnea on exertion, edema, orthopnea, palpitations,  paroxysmal nocturnal dyspnea or shortness of breath Dermatological: negative for rash Respiratory: negative for cough or wheezing Urologic: negative for hematuria Abdominal: negative for nausea, vomiting, diarrhea, bright red blood per rectum, melena, or hematemesis Neurologic: negative for visual changes, syncope, or dizziness All other systems reviewed and are otherwise negative except as noted above.    Blood pressure 124/84, pulse 81, height 5\' 4"  (1.626 m), weight 195 lb 3.2 oz (88.5 kg), SpO2 98%.  General appearance: alert and no distress Neck: no adenopathy, no JVD, supple, symmetrical, trachea midline, thyroid not enlarged, symmetric, no tenderness/mass/nodules, and right carotid Nira Visscher Lungs: clear to auscultation bilaterally Heart: regular rate and rhythm,  S1, S2 normal, no murmur, click, rub or gallop Extremities: extremities normal, atraumatic, no cyanosis or edema Pulses: 2+ and symmetric Skin: Skin color, texture, turgor normal. No rashes or lesions Neurologic: Grossly normal  EKG not performed today      ASSESSMENT AND PLAN:   Hyperlipidemia associated with type 2 diabetes mellitus (HCC) History of hyperlipidemia on high-dose statin therapy with lipid profile performed 11/10/2022 revealing total cholesterol 132, LDL 63 and HDL 49.  Hypertension complicating diabetes (HCC) History of essential hypertension blood pressure measured today at 124/84.  She is on benazepril, and metoprolol.  Peripheral vascular disease (HCC) History of PAD status post aortobifemoral bypass grafting by Dr. Madilyn Fireman in 2007.  Carotid artery disease (HCC) History of moderate right ICA stenosis by duplex ultrasound performed 10/07/2022.  This will be repeated this coming August.  Coronary artery disease History of CAD status post left heart cath performed radially by Dr. Okey Dupre 03/06/2023 revealing three-vessel disease.  She will need CABG.  She has appoint with Dr. Cliffton Asters tomorrow to discuss.   After being put on Imdur her chest pain has markedly improved.     Runell Gess MD FACP,FACC,FAHA, [facility] 03/15/2023 1:24 PM

## 2023-03-15 NOTE — Assessment & Plan Note (Signed)
History of PAD status post aortobifemoral bypass grafting by Dr. Madilyn Fireman in 2007.

## 2023-03-15 NOTE — Patient Instructions (Signed)
    Follow-Up: At [facility], you and your health needs are our priority.  As part of our continuing mission to provide you with exceptional heart care, we have created designated Provider Care Teams.  These Care Teams include your primary Cardiologist (physician) and Advanced Practice Providers (APPs -  Physician Assistants and Nurse Practitioners) who all work together to provide you with the care you need, when you need it.    Your next appointment:   3 month(s)  Provider:   Nanetta Batty MD

## 2023-03-15 NOTE — Assessment & Plan Note (Signed)
History of CAD status post left heart cath performed radially by Dr. Okey Dupre 03/06/2023 revealing three-vessel disease.  She will need CABG.  She has appoint with Dr. Cliffton Asters tomorrow to discuss.  After being put on Imdur her chest pain has markedly improved.

## 2023-03-15 NOTE — Assessment & Plan Note (Signed)
History of essential hypertension blood pressure measured today at 124/84.  She is on benazepril, and metoprolol.

## 2023-03-15 NOTE — Assessment & Plan Note (Signed)
History of hyperlipidemia on high-dose statin therapy with lipid profile performed 11/10/2022 revealing total cholesterol 132, LDL 63 and HDL 49.

## 2023-03-15 NOTE — Assessment & Plan Note (Signed)
History of moderate right ICA stenosis by duplex ultrasound performed 10/07/2022.  This will be repeated this coming August.

## 2023-03-16 ENCOUNTER — Other Ambulatory Visit (HOSPITAL_BASED_OUTPATIENT_CLINIC_OR_DEPARTMENT_OTHER): Payer: Self-pay

## 2023-03-17 ENCOUNTER — Encounter: Payer: BC Managed Care – PPO | Admitting: Thoracic Surgery (Cardiothoracic Vascular Surgery)

## 2023-03-21 ENCOUNTER — Ambulatory Visit
Admission: RE | Admit: 2023-03-21 | Discharge: 2023-03-21 | Disposition: A | Discharge status: Home / Self Care | Payer: BC Managed Care – PPO | Source: Ambulatory Visit | Attending: Family Medicine | Admitting: Family Medicine

## 2023-03-21 ENCOUNTER — Other Ambulatory Visit: Payer: Self-pay

## 2023-03-21 ENCOUNTER — Other Ambulatory Visit (HOSPITAL_BASED_OUTPATIENT_CLINIC_OR_DEPARTMENT_OTHER): Payer: Self-pay

## 2023-03-21 VITALS — BP 144/85 | HR 107 | Temp 97.9°F | Resp 16

## 2023-03-21 DIAGNOSIS — R059 Cough, unspecified: Secondary | ICD-10-CM | POA: Diagnosis not present

## 2023-03-21 DIAGNOSIS — J40 Bronchitis, not specified as acute or chronic: Secondary | ICD-10-CM | POA: Diagnosis not present

## 2023-03-21 LAB — POCT INFLUENZA A/B
Influenza A, POC: NEGATIVE
Influenza B, POC: NEGATIVE

## 2023-03-21 LAB — POC SARS CORONAVIRUS 2 AG -  ED: SARS Coronavirus 2 Ag: NEGATIVE

## 2023-03-21 MED ORDER — DOXYCYCLINE HYCLATE 100 MG PO CAPS: 100.0000 mg | ORAL_CAPSULE | Freq: Two times a day (BID) | ORAL | 0 refills | Status: AC

## 2023-03-21 MED ORDER — HYDROCODONE BIT-HOMATROP MBR 5-1.5 MG/5ML PO SOLN: 5.0000 mL | Freq: Four times a day (QID) | ORAL | 0 refills | Status: DC | PRN

## 2023-03-21 MED ORDER — PREDNISONE 10 MG (21) PO TBPK: ORAL_TABLET | Freq: Every day | ORAL | 0 refills | Status: DC

## 2023-03-21 NOTE — Discharge Instructions (Addendum)
 Advised patient to take medications as directed with food to completion.  Advised patient to take prednisone  with first dose of doxycycline  until complete.  Advised may use Hycodan at night for cough prior to sleep due to sedative effects.  Encouraged to increase daily water intake to 64 ounces per day while taking these medications.  Advised if symptoms worsen and/or unresolved please follow-up with PCP or here for further evaluation.

## 2023-03-21 NOTE — ED Provider Notes (Signed)
 [name]    CSN: [id number] Arrival date & time: 03/21/23  1831      History   Chief Complaint Chief Complaint  Patient presents with   Cough    Entered by patient    HPI [name] is a 63 y.o. female.   HPI Very pleasant 63 year old female presents with cough for 4 to 5 days.  Patient reports has been caring for her husband who has had COVID-19, influenza, and pneumonia. Patient had a left heart catheterization performed on 03/06/2023 and now will be scheduled for CABG surgery. PMH significant for CAD, morbid obesity, abdominal bloating, and asthma.   Past Medical History:  Diagnosis Date   Abdominal bloating 11/05/2020   Abnormality of right breast on screening mammogram 10/14/2019   Needs further views of right breast.  Breast center to call.       Achilles tendon contracture, right    Acute bilateral low back pain without sciatica 08/04/2006   Qualifier: Diagnosis of   By: Nunzio, RN, Dagoberto Caldron        Acute non-recurrent frontal sinusitis 12/24/2019   Acute recurrent pansinusitis 08/04/2020   ALLERGIC RHINITIS 04/10/2008   Allergic rhinitis 04/10/2008   Qualifier: Diagnosis of  By: Jame  MD, Maude FALCON    Asthma    asthmatic bronchitis   Breast lump in female    Carpal tunnel syndrome of right wrist    Cellulitis 10/31/2019   Chronic right shoulder pain 05/07/2019   DEPRESSION 08/13/2008   Diabetes mellitus without complication (HCC)    Fatigue 11/17/2015   Fatty liver disease, nonalcoholic 03/29/2013   Hepatitis    fatty liver   HYPERLIPIDEMIA 08/04/2006   HYPERTENSION 08/04/2006   HYPOTHYROIDISM 08/04/2006   Insomnia 10/31/2019   Kidney injury    LOW BACK PAIN 08/04/2006   Moderate persistent asthma with (acute) exacerbation 06/27/2019   Neuromuscular disorder (HCC)    Pain of finger of right hand 02/09/2021   Patellofemoral syndrome, right 06/19/2019   PERIMENOPAUSAL SYNDROME 05/08/2008   Plantar fasciitis of right foot 04/12/2017    Plantar fasciitis, left 11/17/2015   Postmenopausal bleeding 07/17/2019   Biopsy and US  c/w endometrial polyp.  Pt to see Dr. Nicholaus for surgical consult.     Primary insomnia 10/31/2019   PVD (peripheral vascular disease) (HCC)    occlusive, status post bifem bypass 2007   SOB (shortness of breath) 07/12/2016   Vaginal candidiasis 10/31/2019    Patient Active Problem List   Diagnosis Date Noted   Coronary artery disease 03/15/2023   Carotid artery disease (HCC) 02/27/2023   Acute recurrent pansinusitis 02/13/2023   Chest pain on exertion 02/13/2023   Pain 02/13/2023   Family history of dementia 09/13/2022   Chondromalacia of right patella 12/17/2021   Osteoarthritis of right acromioclavicular joint 03/09/2021   Subacromial impingement of right shoulder 12/31/2020   Moderate persistent asthma without complication 11/27/2020   Reflux gastritis 11/05/2020   Rosacea 08/04/2020   Situational anxiety 05/11/2020   Body mass index (BMI) of 36.0-36.9 in adult 05/11/2020   DDD (degenerative disc disease), cervical 08/30/2019   Nonalcoholic steatohepatitis (NASH) 08/08/2019   Migraine with aura and without status migrainosus, not intractable 05/31/2017   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   Diabetes 1.5, 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   Type 2 diabetes mellitus with complications (HCC) 01/14/2015   Vitamin D  deficiency 01/14/2015   Peripheral vascular disease (HCC) 07/15/2011   Hypothyroidism 08/04/2006   Hyperlipidemia associated with  type 2 diabetes mellitus (HCC) 08/04/2006   Hypertension complicating diabetes (HCC) 08/04/2006    Past Surgical History:  Procedure Laterality Date   CARPAL TUNNEL RELEASE Right 04/05/2016   Procedure: OPEN RIGHT CARPAL TUNNEL RELEASE;  Surgeon: Lynwood FORBES Better, MD;  Location: [facility];  Service: Orthopedics;  Laterality: Right;   CHOLECYSTECTOMY     FEMORAL BYPASS     bifem.   HAGLAND'S DEFORMITY EXCISION     LEFT  HEART CATH AND CORONARY ANGIOGRAPHY N/A 03/06/2023   Procedure: LEFT HEART CATH AND CORONARY ANGIOGRAPHY;  Surgeon: Mady Bruckner, MD;  Location: [facility];  Service: Cardiovascular;  Laterality: N/A;   PLANTAR FASCIA RELEASE Right 04/18/2018   Procedure: PLANTAR FASCIA RELEASE AND GASTROCNEMIUS RECESSION RIGHT;  Surgeon: Harden Jerona GAILS, MD;  Location: [facility];  Service: Orthopedics;  Laterality: Right;   TUBAL LIGATION      OB History     Gravida  3   Para  3   Term  3   Preterm      AB      Living  2      SAB      IAB      Ectopic      Multiple      Live Births               Home Medications    Prior to Admission medications   Medication Sig Start Date End Date Taking? Authorizing Provider  doxycycline  (VIBRAMYCIN ) 100 MG capsule Take 1 capsule (100 mg total) by mouth 2 (two) times daily for 7 days. 03/21/23 03/28/23 Yes Teddy Sharper, FNP  HYDROcodone  bit-homatropine (HYCODAN) 5-1.5 MG/5ML syrup Take 5 mLs by mouth every 6 (six) hours as needed for cough. 03/21/23  Yes Teddy Sharper, FNP  predniSONE  (STERAPRED UNI-PAK 21 TAB) 10 MG (21) TBPK tablet Take by mouth daily. Take 6 tabs by mouth daily  for 2 days, then 5 tabs for 2 days, then 4 tabs for 2 days, then 3 tabs for 2 days, 2 tabs for 2 days, then 1 tab by mouth daily for 2 days 03/21/23  Yes Teddy Sharper, FNP  albuterol  (PROVENTIL ) (2.5 MG/3ML) 0.083% nebulizer solution Take 3 mLs (2.5 mg total) by nebulization every 6 (six) hours as needed for wheezing or shortness of breath. Patient not taking: Reported on 03/15/2023 05/10/19   Early, Sara E, NP  albuterol  (VENTOLIN  HFA) 108 (90 Base) MCG/ACT inhaler Inhale 1-2 puffs into the lungs every 4 (four) hours as needed for wheezing or shortness of breath. 12/09/21   Early, Sara E, NP  ALPRAZolam  (XANAX ) 0.5 MG tablet Take 0.5-1 tablets (0.25-0.5 mg total) by mouth 2 (two) times daily as needed for anxiety. 02/13/23   Early, Sara E, NP  aspirin  EC 81 MG tablet  Take 1 tablet (81 mg total) by mouth daily. Swallow whole. 09/18/19   Pietro Redell RAMAN, MD  Azelaic Acid  15 % gel After skin is thoroughly washed and patted dry, gently but thoroughly massage a thin film of azelaic acid  cream into the affected area twice daily, in the morning and evening. 11/17/21   Early, Sara E, NP  B Complex Vitamins (VITAMIN B COMPLEX PO) Take 1 tablet by mouth daily.    [provider]  benazepril  (LOTENSIN ) 10 MG tablet Take 1 tablet (10 mg total) by mouth daily. 02/06/23   Early, Sara E, NP  celecoxib  (CELEBREX ) 200 MG capsule Take 1 capsule (200 mg total) by  mouth daily. 11/11/22   Early, Sara E, NP  cetirizine  (ZYRTEC ) 10 MG tablet Take 1 tablet (10 mg total) by mouth daily. 02/16/23   Soldatova, Liuba, MD  Continuous Glucose Receiver (DEXCOM G7 RECEIVER) DEVI For use with Dexcom G7 sensor 06/14/22   Early, Sara E, NP  Continuous Glucose Sensor (DEXCOM G7 SENSOR) MISC Apply new sensor every 10 days for continuous glucose monitoring. 06/14/22   Early, Sara E, NP  fluticasone  (FLONASE ) 50 MCG/ACT nasal spray Place 2 sprays into both nostrils 2 (two) times daily. Patient taking differently: Place 2 sprays into both nostrils 2 (two) times daily as needed for allergies. 02/16/23   Soldatova, Liuba, MD  fluticasone  furoate-vilanterol (BREO ELLIPTA ) 100-25 MCG/ACT AEPB Inhale 1 puff into the lungs once daily. 07/08/22   Early, Sara E, NP  isosorbide  mononitrate (IMDUR ) 30 MG 24 hr tablet Take 0.5 tablets (15 mg total) by mouth daily. 03/06/23 03/05/24  End, Lonni, MD  levothyroxine  (SYNTHROID ) 112 MCG tablet Take 1 tablet (112 mcg total) by mouth daily. 02/20/23   Early, Sara E, NP  Menthol, Topical Analgesic, (ICY HOT EX) Apply 1 application topically daily as needed (pain).    [provider]  metoprolol  succinate (TOPROL -XL) 100 MG 24 hr tablet Take 1 tablet (100 mg total) by mouth daily. Take with or immediately following a meal. 02/06/23   Early, Sara E, NP   nitroGLYCERIN  (NITROSTAT ) 0.4 MG SL tablet Place 1 tablet (0.4 mg total) under the tongue every 5 (five) minutes as needed for chest pain. Max three doses in a row. Proceed to ED for evaluation if no improvement. 02/13/23   Early, Sara E, NP  pantoprazole  (PROTONIX ) 40 MG tablet Take 1 tablet (40 mg total) by mouth daily. 02/09/23   Early, Sara E, NP  rosuvastatin  (CRESTOR ) 40 MG tablet Take 1 tablet (40 mg total) by mouth daily. 02/09/23   Early, Sara E, NP  Tiotropium Bromide  Monohydrate (SPIRIVA  RESPIMAT) 2.5 MCG/ACT AERS Inhale 2 puffs into the lungs once daily. 11/25/22   Early, Sara E, NP  tirzepatide  (MOUNJARO ) 12.5 MG/0.5ML Pen Inject 12.5 mg into the skin once a week. 10/12/22   Oris Camie BRAVO, NP  venlafaxine  XR (EFFEXOR -XR) 75 MG 24 hr capsule Take 3 capsules (225 mg total) by mouth daily with breakfast. 01/31/23   Early, Camie BRAVO, NP  Vitamin D , Ergocalciferol , (DRISDOL ) 1.25 MG (50000 UNIT) CAPS capsule Take 1 capsule (50,000 Units total) by mouth every 7 (seven) days. Patient not taking: Reported on 03/15/2023 02/17/22   Early, Camie BRAVO, NP    Family History Family History  Problem Relation Age of Onset   Hypertension Mother    Heart disease Father    Hyperlipidemia Father    Hypertension Father    Alcohol abuse Brother    Diabetes Maternal Grandmother    Alcohol abuse Maternal Uncle    Stroke Maternal Uncle    Ovarian cancer Daughter     Social History Social History   Tobacco Use   Smoking status: Former    Current packs/day: 0.00    Types: Cigarettes    Quit date: 02/15/2004    Years since quitting: 19.1   Smokeless tobacco: Never  Vaping Use   Vaping status: Never Used  Substance Use Topics   Alcohol use: Yes    Comment: occasional   Drug use: No     Allergies   Influenza virus vacc split pf, Gluten meal, and Lipitor [atorvastatin ]   Review of Systems Review of  Systems  HENT:  Positive for postnasal drip and rhinorrhea.   Respiratory:  Positive for cough.    All other systems reviewed and are negative.    Physical Exam Triage Vital Signs ED Triage Vitals  Encounter Vitals Group     BP      Systolic BP Percentile      Diastolic BP Percentile      Pulse      Resp      Temp      Temp src      SpO2      Weight      Height      Head Circumference      Peak Flow      Pain Score      Pain Loc      Pain Education      Exclude from Growth Chart    No data found.  Updated Vital Signs BP (!) 144/85   Pulse (!) 107   Temp 97.9 F (36.6 C) (Oral)   Resp 16   SpO2 98%    Physical Exam Vitals and nursing note reviewed.  Constitutional:      Appearance: Normal appearance. She is obese.  HENT:     Head: Normocephalic and atraumatic.     Right Ear: Tympanic membrane, ear canal and external ear normal.     Left Ear: Tympanic membrane, ear canal and external ear normal.     Mouth/Throat:     Mouth: Mucous membranes are moist.     Pharynx: Oropharynx is clear.  Eyes:     Extraocular Movements: Extraocular movements intact.     Conjunctiva/sclera: Conjunctivae normal.     Pupils: Pupils are equal, round, and reactive to light.  Cardiovascular:     Rate and Rhythm: Normal rate and regular rhythm.     Pulses: Normal pulses.     Heart sounds: Normal heart sounds.  Pulmonary:     Effort: Pulmonary effort is normal.     Breath sounds: Rhonchi present. No wheezing or rales.     Comments: Diffuse scattered rhonchi noted throughout, infrequent nonproductive cough on exam Musculoskeletal:        General: Normal range of motion.     Cervical back: Normal range of motion and neck supple.  Skin:    General: Skin is warm and dry.  Neurological:     General: No focal deficit present.     Mental Status: She is alert and oriented to person, place, and time. Mental status is at baseline.  Psychiatric:        Mood and Affect: Mood normal.        Behavior: Behavior normal.      UC Treatments / Results  Labs (all labs ordered are  listed, but only abnormal results are displayed) Labs Reviewed  POC SARS CORONAVIRUS 2 AG -  ED  POCT INFLUENZA A/B    EKG   Radiology No results found.  Procedures Procedures (including critical Black time)  Medications Ordered in UC Medications - No data to display  Initial Impression / Assessment and Plan / UC Course  I have reviewed the triage vital signs and the nursing notes.  Pertinent labs & imaging results that were available during my Black of the patient were reviewed by me and considered in my medical decision making (see chart for details).     MDM: 1.  Cough, unspecified type-Rx'd doxycycline  100 mg capsule: Take 1 capsule twice daily x 7 days; 2.  Bronchitis-Rx'd Sterapred Unipak (tapering from 60 mg to 10 mg over 10 days, Rx'd Hycodan 5-1.5 mg / 5 mL syrup: Take 5 mL every 6 hours as needed for cough. Advised patient to take medications as directed with food to completion.  Advised patient to take prednisone  with first dose of doxycycline  until complete.  Advised may use Hycodan at night for cough prior to sleep due to sedative effects.  Encouraged to increase daily water intake to 64 ounces per day while taking these medications.  Advised if symptoms worsen and/or unresolved please follow-up with PCP or here for further evaluation.  Final Clinical Impressions(s) / UC Diagnoses   Final diagnoses:  Cough, unspecified type  Bronchitis     Discharge Instructions      Advised patient to take medications as directed with food to completion.  Advised patient to take prednisone  with first dose of doxycycline  until complete.  Advised may use Hycodan at night for cough prior to sleep due to sedative effects.  Encouraged to increase daily water intake to 64 ounces per day while taking these medications.  Advised if symptoms worsen and/or unresolved please follow-up with PCP or here for further evaluation.     ED Prescriptions     Medication Sig Dispense Auth. Provider    doxycycline  (VIBRAMYCIN ) 100 MG capsule Take 1 capsule (100 mg total) by mouth 2 (two) times daily for 7 days. 14 capsule Ambry Dix, FNP   predniSONE  (STERAPRED UNI-PAK 21 TAB) 10 MG (21) TBPK tablet Take by mouth daily. Take 6 tabs by mouth daily  for 2 days, then 5 tabs for 2 days, then 4 tabs for 2 days, then 3 tabs for 2 days, 2 tabs for 2 days, then 1 tab by mouth daily for 2 days 42 tablet Teddy Sharper, FNP   HYDROcodone  bit-homatropine (HYCODAN) 5-1.5 MG/5ML syrup Take 5 mLs by mouth every 6 (six) hours as needed for cough. 120 mL Teddy Sharper, FNP      I have reviewed the PDMP during this encounter.   Teddy Sharper, FNP 03/21/23 2008

## 2023-03-21 NOTE — ED Triage Notes (Addendum)
 Last week husband was positive for covid and flu, and had pna. Patient reports she needs testing for covid and flu in order to have her consultation for her cardiac bypass surgery. Sunday started with nasal congestion, Monday had rhinorrhea starting. Had diarrhea today. Reports is taking advil.

## 2023-03-22 ENCOUNTER — Other Ambulatory Visit (HOSPITAL_BASED_OUTPATIENT_CLINIC_OR_DEPARTMENT_OTHER): Payer: Self-pay

## 2023-03-23 ENCOUNTER — Other Ambulatory Visit (HOSPITAL_BASED_OUTPATIENT_CLINIC_OR_DEPARTMENT_OTHER): Payer: Self-pay

## 2023-03-23 ENCOUNTER — Other Ambulatory Visit: Payer: Self-pay | Admitting: Nurse Practitioner

## 2023-03-23 ENCOUNTER — Other Ambulatory Visit: Payer: Self-pay

## 2023-03-23 DIAGNOSIS — E559 Vitamin D deficiency, unspecified: Secondary | ICD-10-CM

## 2023-03-23 MED ORDER — VITAMIN D (ERGOCALCIFEROL) 1.25 MG (50000 UNIT) PO CAPS
50000.0000 [IU] | ORAL_CAPSULE | ORAL | 0 refills | Status: DC
  Filled 2023-03-23: qty 12, 84d supply, fill #0

## 2023-03-23 NOTE — H&P (View-Only) (Signed)
 [address]       [place] [address]             [phone]        [name] Medical Record #[id number] Date of Birth: 15-Jun-1960  Referring: Mady Bruckner, MD Primary Care: Early, Camie BRAVO, NP Primary Cardiologist:None  Chief Complaint:    Chief Complaint  Patient presents with   Coronary Artery Disease    New patient consultation, CATH 1/20, ECHO 1/14    History of Present Illness:     [name] 63 y.o. female presents for surgical evaluation of three-vessel coronary artery disease.  She has been having anginal symptoms since the summer 2024.  She had previously undergone a stress test which was negative, but has been having some anginal symptoms with exertion.  She also has a history of anxiety and costochondritis, thus admits to atypical chest pain on a regular basis.      Past Medical and Surgical History: Previous Chest Surgery: No Previous Chest Radiation: No Diabetes Mellitus: Yes.  HbA1C 6.3 Creatinine:  Lab Results  Component Value Date   CREATININE 0.82 02/27/2023   CREATININE 1.04 (H) 11/10/2022   CREATININE 0.97 08/08/2022     Past Medical History:  Diagnosis Date   Abdominal bloating 11/05/2020   Abnormality of right breast on screening mammogram 10/14/2019   Needs further views of right breast.  Breast center to call.       Achilles tendon contracture, right    Acute bilateral low back pain without sciatica 08/04/2006   Qualifier: Diagnosis of   By: Nunzio, RN, Dagoberto Caldron        Acute non-recurrent frontal sinusitis 12/24/2019   Acute recurrent pansinusitis 08/04/2020   ALLERGIC RHINITIS 04/10/2008   Allergic rhinitis 04/10/2008   Qualifier: Diagnosis of  By: Jame  MD, Maude FALCON    Asthma    asthmatic bronchitis   Breast lump in female    Carpal tunnel syndrome of right wrist    Cellulitis 10/31/2019   Chronic right shoulder pain 05/07/2019   DEPRESSION 08/13/2008   Diabetes mellitus without  complication (HCC)    Fatigue 11/17/2015   Fatty liver disease, nonalcoholic 03/29/2013   Hepatitis    fatty liver   HYPERLIPIDEMIA 08/04/2006   HYPERTENSION 08/04/2006   HYPOTHYROIDISM 08/04/2006   Insomnia 10/31/2019   Kidney injury    LOW BACK PAIN 08/04/2006   Moderate persistent asthma with (acute) exacerbation 06/27/2019   Neuromuscular disorder (HCC)    Pain of finger of right hand 02/09/2021   Patellofemoral syndrome, right 06/19/2019   PERIMENOPAUSAL SYNDROME 05/08/2008   Plantar fasciitis of right foot 04/12/2017   Plantar fasciitis, left 11/17/2015   Postmenopausal bleeding 07/17/2019   Biopsy and US  c/w endometrial polyp.  Pt to see Dr. Nicholaus for surgical consult.     Primary insomnia 10/31/2019   PVD (peripheral vascular disease) (HCC)    occlusive, status post bifem bypass 2007   SOB (shortness of breath) 07/12/2016   Vaginal candidiasis 10/31/2019    Past Surgical History:  Procedure Laterality Date   CARPAL TUNNEL RELEASE Right 04/05/2016   Procedure: OPEN RIGHT CARPAL TUNNEL RELEASE;  Surgeon: Lynwood BRAVO Better, MD;  Location:  [facility];  Service: Orthopedics;  Laterality: Right;   CHOLECYSTECTOMY     FEMORAL BYPASS     bifem.   HAGLAND'S DEFORMITY EXCISION     LEFT HEART CATH AND CORONARY ANGIOGRAPHY N/A 03/06/2023  Procedure: LEFT HEART CATH AND CORONARY ANGIOGRAPHY;  Surgeon: Mady Bruckner, MD;  [facility]: [facility];  Service: Cardiovascular;  Laterality: N/A;   PLANTAR FASCIA RELEASE Right 04/18/2018   Procedure: PLANTAR FASCIA RELEASE AND GASTROCNEMIUS RECESSION RIGHT;  Surgeon: Harden Jerona GAILS, MD;  [facility];  Service: Orthopedics;  Laterality: Right;   TUBAL LIGATION      Social History:  Social History   Tobacco Use  Smoking Status Former   Current packs/day: 0.00   Types: Cigarettes   Quit date: 02/15/2004   Years since quitting: 19.1  Smokeless Tobacco Never    Social History   Substance and Sexual Activity   Alcohol Use Yes   Comment: occasional     Allergies  Allergen Reactions   Influenza Virus Vacc Split Pf Swelling and Other (See Comments)    Swelling around injection site   Gluten Meal Diarrhea   Lipitor [Atorvastatin ] Other (See Comments)    Hepatis       Current Outpatient Medications  Medication Sig Dispense Refill   ALPRAZolam  (XANAX ) 0.5 MG tablet Take 0.5-1 tablets (0.25-0.5 mg total) by mouth 2 (two) times daily as needed for anxiety. 60 tablet 3   aspirin  EC 81 MG tablet Take 1 tablet (81 mg total) by mouth daily. Swallow whole. 90 tablet 3   Azelaic Acid  15 % gel After skin is thoroughly washed and patted dry, gently but thoroughly massage a thin film of azelaic acid  cream into the affected area twice daily, in the morning and evening. 50 g 6   B Complex Vitamins (VITAMIN B COMPLEX PO) Take 1 tablet by mouth daily.     benazepril  (LOTENSIN ) 10 MG tablet Take 1 tablet (10 mg total) by mouth daily. 90 tablet 0   celecoxib  (CELEBREX ) 200 MG capsule Take 1 capsule (200 mg total) by mouth daily. 90 capsule 1   cetirizine  (ZYRTEC ) 10 MG tablet Take 1 tablet (10 mg total) by mouth daily. 30 tablet 11   Continuous Glucose Receiver (DEXCOM G7 RECEIVER) DEVI For use with Dexcom G7 sensor 1 each 0   Continuous Glucose Sensor (DEXCOM G7 SENSOR) MISC Apply new sensor every 10 days for continuous glucose monitoring. 9 each 3   doxycycline  (VIBRAMYCIN ) 100 MG capsule Take 1 capsule (100 mg total) by mouth 2 (two) times daily for 7 days. 14 capsule 0   fluticasone  (FLONASE ) 50 MCG/ACT nasal spray Place 2 sprays into both nostrils 2 (two) times daily. (Patient taking differently: Place 2 sprays into both nostrils 2 (two) times daily as needed for allergies.) 16 g 6   HYDROcodone  bit-homatropine (HYCODAN) 5-1.5 MG/5ML syrup Take 5 mLs by mouth every 6 (six) hours as needed for cough. 120 mL 0   isosorbide  mononitrate (IMDUR ) 30 MG 24 hr tablet Take 0.5 tablets (15 mg total) by mouth daily. 30  tablet 5   levothyroxine  (SYNTHROID ) 112 MCG tablet Take 1 tablet (112 mcg total) by mouth daily. 90 tablet 1   Menthol, Topical Analgesic, (ICY HOT EX) Apply 1 application topically daily as needed (pain).     metoprolol  succinate (TOPROL -XL) 100 MG 24 hr tablet Take 1 tablet (100 mg total) by mouth daily. Take with or immediately following a meal. 90 tablet 0   nitroGLYCERIN  (NITROSTAT ) 0.4 MG SL tablet Place 1 tablet (0.4 mg total) under the tongue every 5 (five) minutes as needed for chest pain. Max three doses in a row. Proceed to ED for evaluation if no improvement. 25 tablet 3  pantoprazole  (PROTONIX ) 40 MG tablet Take 1 tablet (40 mg total) by mouth daily. 90 tablet 0   predniSONE  (STERAPRED UNI-PAK 21 TAB) 10 MG (21) TBPK tablet Take by mouth daily. Take 6 tabs by mouth daily  for 2 days, then 5 tabs for 2 days, then 4 tabs for 2 days, then 3 tabs for 2 days, 2 tabs for 2 days, then 1 tab by mouth daily for 2 days 42 tablet 0   rosuvastatin  (CRESTOR ) 40 MG tablet Take 1 tablet (40 mg total) by mouth daily. 90 tablet 0   Tiotropium Bromide  Monohydrate (SPIRIVA  RESPIMAT) 2.5 MCG/ACT AERS Inhale 2 puffs into the lungs daily. 4 g 11   tirzepatide  (MOUNJARO ) 12.5 MG/0.5ML Pen Inject 12.5 mg into the skin once a week. 6 mL 1   venlafaxine  XR (EFFEXOR -XR) 75 MG 24 hr capsule Take 3 capsules (225 mg total) by mouth daily with breakfast. 270 capsule 3   Vitamin D , Ergocalciferol , (DRISDOL ) 1.25 MG (50000 UNIT) CAPS capsule Take 1 capsule (50,000 Units total) by mouth every 7 (seven) days. 12 capsule 0   No current facility-administered medications for this visit.    (Not in a hospital admission)   Family History  Problem Relation Age of Onset   Hypertension Mother    Heart disease Father    Hyperlipidemia Father    Hypertension Father    Alcohol abuse Brother    Diabetes Maternal Grandmother    Alcohol abuse Maternal Uncle    Stroke Maternal Uncle    Ovarian cancer Daughter       Review of Systems:   Review of Systems  Constitutional:  Positive for malaise/fatigue.  Respiratory:  Positive for shortness of breath.   Cardiovascular:  Positive for chest pain.  Musculoskeletal:  Positive for joint pain and myalgias.  Psychiatric/Behavioral:  The patient is nervous/anxious.       Physical Exam: BP 123/77 (BP Location: Right Arm, Patient Position: Sitting, Cuff Size: Normal)   Pulse 71   Resp 20   Ht 5' 4 (1.626 m)   Wt 190 lb (86.2 kg)   SpO2 96% Comment: RA  BMI 32.61 kg/m  Physical Exam Constitutional:      General: She is not in acute distress.    Appearance: She is not ill-appearing.  HENT:     Head: Normocephalic and atraumatic.  Eyes:     Extraocular Movements: Extraocular movements intact.  Cardiovascular:     Rate and Rhythm: Normal rate.  Musculoskeletal:        General: Normal range of motion.     Cervical back: Normal range of motion.  Skin:    General: Skin is warm and dry.  Neurological:     General: No focal deficit present.     Mental Status: She is alert and oriented to person, place, and time.       Diagnostic Studies & Laboratory data:    Left Heart Catherization:  Intervention Echo: IMPRESSIONS     1. Left ventricular ejection fraction, by estimation, is 65 to 70%. The  left ventricle has normal function. The left ventricle has no regional  wall motion abnormalities. There is mild concentric left ventricular  hypertrophy. Left ventricular diastolic  parameters are consistent with Grade I diastolic dysfunction (impaired  relaxation). The average left ventricular global longitudinal strain is  -14.8 %. The global longitudinal strain is abnormal.   2. Right ventricular systolic function is normal. The right ventricular  size is normal.   3. The  mitral valve is normal in structure. No evidence of mitral valve  regurgitation. No evidence of mitral stenosis.   4. The aortic valve is tricuspid. Aortic valve  regurgitation is not  visualized. No aortic stenosis is present.   5. The inferior vena cava is normal in size with greater than 50%  respiratory variability, suggesting right atrial pressure of 3 mmHg.    EKG: sinus I have independently reviewed the above radiologic studies and discussed with the patient   Recent Lab Findings: Lab Results  Component Value Date   WBC 7.3 02/27/2023   HGB 14.6 02/27/2023   HCT 43.8 02/27/2023   PLT 158 02/27/2023   GLUCOSE 108 (H) 02/27/2023   CHOL 132 11/10/2022   TRIG 111 11/10/2022   HDL 49 11/10/2022   LDLDIRECT 157.7 03/25/2013   LDLCALC 63 11/10/2022   ALT 22 11/10/2022   AST 25 11/10/2022   NA 140 02/27/2023   K 3.9 02/27/2023   CL 103 02/27/2023   CREATININE 0.82 02/27/2023   BUN 19 02/27/2023   CO2 21 02/27/2023   TSH 1.850 11/10/2022   HGBA1C 6.3 (H) 11/10/2022      Assessment / Plan:   63yo female with 3V CAD.  Echocardiogram shows preserved biventricular function and no significant valvular disease.  On review of her left heart catherization, she does not have any good lateral wall targets.  The PAD, LAD, and diagonals appear suitable.  The risks and benefits of off pump CABG were discussed in detail.  The patient is agreeable to proceed.  Given her previous vascular history she will require carotid duplex before surgery.     PDA, diagonal, LAD.  No good lateral wall targets   I  spent 40 minutes counseling the patient face to face.   [name] 03/24/2023 10:50 AM

## 2023-03-23 NOTE — Progress Notes (Signed)
 [address]       [place] [address]             [phone]        [name] [place] Medical Record #[id number] Date of Birth: 03-25-1960  Referring: Yvonne Kendall, MD Primary Care: Early, Sung Amabile, NP Primary Cardiologist:None  Chief Complaint:    Chief Complaint  Patient presents with   Coronary Artery Disease    New patient consultation, CATH 1/20, ECHO 1/14    History of Present Illness:     [name] 63 y.o. female presents for surgical evaluation of three-vessel coronary artery disease.  She has been having anginal symptoms since the summer 2024.  She had previously undergone a stress test which was negative, but has been having some anginal symptoms with exertion.  She also has a history of anxiety and costochondritis, thus admits to atypical chest pain on a regular basis.      Past Medical and Surgical History: Previous Chest Surgery: No Previous Chest Radiation: No Diabetes Mellitus: Yes.  HbA1C 6.3 Creatinine:  Lab Results  Component Value Date   CREATININE 0.82 02/27/2023   CREATININE 1.04 (H) 11/10/2022   CREATININE 0.97 08/08/2022     Past Medical History:  Diagnosis Date   Abdominal bloating 11/05/2020   Abnormality of right breast on screening mammogram 10/14/2019   Needs further views of right breast.  Breast center to call.       Achilles tendon contracture, right    Acute bilateral low back pain without sciatica 08/04/2006   Qualifier: Diagnosis of   By: Marcelyn Ditty, RN, Katy Fitch        Acute non-recurrent frontal sinusitis 12/24/2019   Acute recurrent pansinusitis 08/04/2020   ALLERGIC RHINITIS 04/10/2008   Allergic rhinitis 04/10/2008   Qualifier: Diagnosis of  By: Amador Cunas  MD, Janett Labella    Asthma    asthmatic bronchitis   Breast lump in female    Carpal tunnel syndrome of right wrist    Cellulitis 10/31/2019   Chronic right shoulder pain 05/07/2019   DEPRESSION 08/13/2008   Diabetes mellitus without  complication (HCC)    Fatigue 11/17/2015   Fatty liver disease, nonalcoholic 03/29/2013   Hepatitis    fatty liver   HYPERLIPIDEMIA 08/04/2006   HYPERTENSION 08/04/2006   HYPOTHYROIDISM 08/04/2006   Insomnia 10/31/2019   Kidney injury    LOW BACK PAIN 08/04/2006   Moderate persistent asthma with (acute) exacerbation 06/27/2019   Neuromuscular disorder (HCC)    Pain of finger of right hand 02/09/2021   Patellofemoral syndrome, right 06/19/2019   PERIMENOPAUSAL SYNDROME 05/08/2008   Plantar fasciitis of right foot 04/12/2017   Plantar fasciitis, left 11/17/2015   Postmenopausal bleeding 07/17/2019   Biopsy and [place] c/w endometrial polyp.  Pt to see Dr. Earlene Plater for surgical consult.     Primary insomnia 10/31/2019   PVD (peripheral vascular disease) (HCC)    occlusive, status post bifem bypass 2007   SOB (shortness of breath) 07/12/2016   Vaginal candidiasis 10/31/2019    Past Surgical History:  Procedure Laterality Date   CARPAL TUNNEL RELEASE Right 04/05/2016   Procedure: OPEN RIGHT CARPAL TUNNEL RELEASE;  Surgeon: Kerrin Champagne, MD;  Location: [facility];  Service: Orthopedics;  Laterality: Right;   CHOLECYSTECTOMY     FEMORAL BYPASS     bifem.   HAGLAND'S DEFORMITY EXCISION     LEFT HEART CATH AND CORONARY ANGIOGRAPHY N/A 03/06/2023  Procedure: LEFT HEART CATH AND CORONARY ANGIOGRAPHY;  Surgeon: Yvonne Kendall, MD;  Location: [facility];  Service: Cardiovascular;  Laterality: N/A;   PLANTAR FASCIA RELEASE Right 04/18/2018   Procedure: PLANTAR FASCIA RELEASE AND GASTROCNEMIUS RECESSION RIGHT;  Surgeon: Nadara Mustard, MD;  Location: [facility] OR;  Service: Orthopedics;  Laterality: Right;   TUBAL LIGATION      Social History:  Social History   Tobacco Use  Smoking Status Former   Current packs/day: 0.00   Types: Cigarettes   Quit date: 02/15/2004   Years since quitting: 19.1  Smokeless Tobacco Never    Social History   Substance and Sexual Activity   Alcohol Use Yes   Comment: occasional     Allergies  Allergen Reactions   Influenza Virus Vacc Split Pf Swelling and Other (See Comments)    Swelling around injection site   Gluten Meal Diarrhea   Lipitor [Atorvastatin] Other (See Comments)    Hepatis       Current Outpatient Medications  Medication Sig Dispense Refill   ALPRAZolam (XANAX) 0.5 MG tablet Take 0.5-1 tablets (0.25-0.5 mg total) by mouth 2 (two) times daily as needed for anxiety. 60 tablet 3   aspirin EC 81 MG tablet Take 1 tablet (81 mg total) by mouth daily. Swallow whole. 90 tablet 3   Azelaic Acid 15 % gel After skin is thoroughly washed and patted dry, gently but thoroughly massage a thin film of azelaic acid cream into the affected area twice daily, in the morning and evening. 50 g 6   B Complex Vitamins (VITAMIN B COMPLEX PO) Take 1 tablet by mouth daily.     benazepril (LOTENSIN) 10 MG tablet Take 1 tablet (10 mg total) by mouth daily. 90 tablet 0   celecoxib (CELEBREX) 200 MG capsule Take 1 capsule (200 mg total) by mouth daily. 90 capsule 1   cetirizine (ZYRTEC) 10 MG tablet Take 1 tablet (10 mg total) by mouth daily. 30 tablet 11   Continuous Glucose Receiver (DEXCOM G7 RECEIVER) DEVI For use with Dexcom G7 sensor 1 each 0   Continuous Glucose Sensor (DEXCOM G7 SENSOR) MISC Apply new sensor every 10 days for continuous glucose monitoring. 9 each 3   doxycycline (VIBRAMYCIN) 100 MG capsule Take 1 capsule (100 mg total) by mouth 2 (two) times daily for 7 days. 14 capsule 0   fluticasone (FLONASE) 50 MCG/ACT nasal spray Place 2 sprays into both nostrils 2 (two) times daily. (Patient taking differently: Place 2 sprays into both nostrils 2 (two) times daily as needed for allergies.) 16 g 6   HYDROcodone bit-homatropine (HYCODAN) 5-1.5 MG/5ML syrup Take 5 mLs by mouth every 6 (six) hours as needed for cough. 120 mL 0   isosorbide mononitrate (IMDUR) 30 MG 24 hr tablet Take 0.5 tablets (15 mg total) by mouth daily. 30  tablet 5   levothyroxine (SYNTHROID) 112 MCG tablet Take 1 tablet (112 mcg total) by mouth daily. 90 tablet 1   Menthol, Topical Analgesic, (ICY HOT EX) Apply 1 application topically daily as needed (pain).     metoprolol succinate (TOPROL-XL) 100 MG 24 hr tablet Take 1 tablet (100 mg total) by mouth daily. Take with or immediately following a meal. 90 tablet 0   nitroGLYCERIN (NITROSTAT) 0.4 MG SL tablet Place 1 tablet (0.4 mg total) under the tongue every 5 (five) minutes as needed for chest pain. Max three doses in a row. Proceed to ED for evaluation if no improvement. 25 tablet 3  pantoprazole (PROTONIX) 40 MG tablet Take 1 tablet (40 mg total) by mouth daily. 90 tablet 0   predniSONE (STERAPRED UNI-PAK 21 TAB) 10 MG (21) TBPK tablet Take by mouth daily. Take 6 tabs by mouth daily  for 2 days, then 5 tabs for 2 days, then 4 tabs for 2 days, then 3 tabs for 2 days, 2 tabs for 2 days, then 1 tab by mouth daily for 2 days 42 tablet 0   rosuvastatin (CRESTOR) 40 MG tablet Take 1 tablet (40 mg total) by mouth daily. 90 tablet 0   Tiotropium Bromide Monohydrate (SPIRIVA RESPIMAT) 2.5 MCG/ACT AERS Inhale 2 puffs into the lungs daily. 4 g 11   tirzepatide (MOUNJARO) 12.5 MG/0.5ML Pen Inject 12.5 mg into the skin once a week. 6 mL 1   venlafaxine XR (EFFEXOR-XR) 75 MG 24 hr capsule Take 3 capsules (225 mg total) by mouth daily with breakfast. 270 capsule 3   Vitamin D, Ergocalciferol, (DRISDOL) 1.25 MG (50000 UNIT) CAPS capsule Take 1 capsule (50,000 Units total) by mouth every 7 (seven) days. 12 capsule 0   No current facility-administered medications for this visit.    (Not in a hospital admission)   Family History  Problem Relation Age of Onset   Hypertension Mother    Heart disease Father    Hyperlipidemia Father    Hypertension Father    Alcohol abuse Brother    Diabetes Maternal Grandmother    Alcohol abuse Maternal Uncle    Stroke Maternal Uncle    Ovarian cancer Daughter       Review of Systems:   Review of Systems  Constitutional:  Positive for malaise/fatigue.  Respiratory:  Positive for shortness of breath.   Cardiovascular:  Positive for chest pain.  Musculoskeletal:  Positive for joint pain and myalgias.  Psychiatric/Behavioral:  The patient is nervous/anxious.       Physical Exam: BP 123/77 (BP Location: Right Arm, Patient Position: Sitting, Cuff Size: Normal)   Pulse 71   Resp 20   Ht 5\' 4"  (1.626 m)   Wt 190 lb (86.2 kg)   SpO2 96% Comment: RA  BMI 32.61 kg/m  Physical Exam Constitutional:      General: She is not in acute distress.    Appearance: She is not ill-appearing.  HENT:     Head: Normocephalic and atraumatic.  Eyes:     Extraocular Movements: Extraocular movements intact.  Cardiovascular:     Rate and Rhythm: Normal rate.  Musculoskeletal:        General: Normal range of motion.     Cervical back: Normal range of motion.  Skin:    General: Skin is warm and dry.  Neurological:     General: No focal deficit present.     Mental Status: She is alert and oriented to person, place, and time.       Diagnostic Studies & Laboratory data:    Left Heart Catherization:  Intervention Echo: IMPRESSIONS     1. Left ventricular ejection fraction, by estimation, is 65 to 70%. The  left ventricle has normal function. The left ventricle has no regional  wall motion abnormalities. There is mild concentric left ventricular  hypertrophy. Left ventricular diastolic  parameters are consistent with Grade I diastolic dysfunction (impaired  relaxation). The average left ventricular global longitudinal strain is  -14.8 %. The global longitudinal strain is abnormal.   2. Right ventricular systolic function is normal. The right ventricular  size is normal.   3. The  mitral valve is normal in structure. No evidence of mitral valve  regurgitation. No evidence of mitral stenosis.   4. The aortic valve is tricuspid. Aortic valve  regurgitation is not  visualized. No aortic stenosis is present.   5. The inferior vena cava is normal in size with greater than 50%  respiratory variability, suggesting right atrial pressure of 3 mmHg.    EKG: sinus I have independently reviewed the above radiologic studies and discussed with the patient   Recent Lab Findings: Lab Results  Component Value Date   WBC 7.3 02/27/2023   HGB 14.6 02/27/2023   HCT 43.8 02/27/2023   PLT 158 02/27/2023   GLUCOSE 108 (H) 02/27/2023   CHOL 132 11/10/2022   TRIG 111 11/10/2022   HDL 49 11/10/2022   LDLDIRECT 157.7 03/25/2013   LDLCALC 63 11/10/2022   ALT 22 11/10/2022   AST 25 11/10/2022   NA 140 02/27/2023   K 3.9 02/27/2023   CL 103 02/27/2023   CREATININE 0.82 02/27/2023   BUN 19 02/27/2023   CO2 21 02/27/2023   TSH 1.850 11/10/2022   HGBA1C 6.3 (H) 11/10/2022      Assessment / Plan:   63yo female with 3V CAD.  Echocardiogram shows preserved biventricular function and no significant valvular disease.  On review of her left heart catherization, she does not have any good lateral wall targets.  The PAD, LAD, and diagonals appear suitable.  The risks and benefits of off pump CABG were discussed in detail.  The patient is agreeable to proceed.  Given her previous vascular history she will require carotid duplex before surgery.     PDA, diagonal, LAD.  No good lateral wall targets   I  spent 40 minutes counseling the patient face to face.   [name] 03/24/2023 10:50 AM

## 2023-03-24 ENCOUNTER — Other Ambulatory Visit (HOSPITAL_BASED_OUTPATIENT_CLINIC_OR_DEPARTMENT_OTHER): Payer: Self-pay

## 2023-03-24 ENCOUNTER — Institutional Professional Consult (permissible substitution) (INDEPENDENT_AMBULATORY_CARE_PROVIDER_SITE_OTHER): Payer: BC Managed Care – PPO | Admitting: Thoracic Surgery (Cardiothoracic Vascular Surgery)

## 2023-03-24 ENCOUNTER — Encounter: Payer: Self-pay | Admitting: Thoracic Surgery (Cardiothoracic Vascular Surgery)

## 2023-03-24 ENCOUNTER — Other Ambulatory Visit: Payer: Self-pay | Admitting: *Deleted

## 2023-03-24 ENCOUNTER — Other Ambulatory Visit (HOSPITAL_BASED_OUTPATIENT_CLINIC_OR_DEPARTMENT_OTHER): Payer: Self-pay | Admitting: Nurse Practitioner

## 2023-03-24 ENCOUNTER — Encounter: Payer: Self-pay | Admitting: *Deleted

## 2023-03-24 ENCOUNTER — Other Ambulatory Visit: Payer: Self-pay | Admitting: Nurse Practitioner

## 2023-03-24 VITALS — BP 123/77 | HR 71 | Resp 20 | Ht 64.0 in | Wt 190.0 lb

## 2023-03-24 DIAGNOSIS — I25110 Atherosclerotic heart disease of native coronary artery with unstable angina pectoris: Secondary | ICD-10-CM

## 2023-03-24 DIAGNOSIS — J4540 Moderate persistent asthma, uncomplicated: Secondary | ICD-10-CM

## 2023-03-24 DIAGNOSIS — J0141 Acute recurrent pansinusitis: Secondary | ICD-10-CM

## 2023-03-27 ENCOUNTER — Other Ambulatory Visit (HOSPITAL_BASED_OUTPATIENT_CLINIC_OR_DEPARTMENT_OTHER): Payer: Self-pay

## 2023-03-27 ENCOUNTER — Other Ambulatory Visit: Payer: Self-pay

## 2023-03-27 DIAGNOSIS — J0141 Acute recurrent pansinusitis: Secondary | ICD-10-CM

## 2023-03-27 DIAGNOSIS — J4540 Moderate persistent asthma, uncomplicated: Secondary | ICD-10-CM

## 2023-03-27 MED ORDER — FLUTICASONE FUROATE-VILANTEROL 100-25 MCG/ACT IN AEPB
1.0000 | INHALATION_SPRAY | Freq: Every day | RESPIRATORY_TRACT | 5 refills | Status: AC
  Filled 2023-03-27: qty 60, 30d supply, fill #0
  Filled 2023-06-15 – 2023-06-23 (×2): qty 60, 30d supply, fill #1
  Filled 2024-01-05: qty 60, 30d supply, fill #2
  Filled 2024-02-06: qty 60, 30d supply, fill #3

## 2023-03-27 MED ORDER — AIRSUPRA 90-80 MCG/ACT IN AERO
1.0000 | INHALATION_SPRAY | Freq: Four times a day (QID) | RESPIRATORY_TRACT | 6 refills | Status: AC | PRN
  Filled 2023-03-27: qty 10.7, 30d supply, fill #0

## 2023-03-28 ENCOUNTER — Other Ambulatory Visit (INDEPENDENT_AMBULATORY_CARE_PROVIDER_SITE_OTHER): Payer: Self-pay | Admitting: Otolaryngology

## 2023-03-28 ENCOUNTER — Telehealth: Payer: Self-pay

## 2023-03-28 NOTE — Telephone Encounter (Signed)
Patient called back stating someone called her to cancel her CT, but there is not telephone note supporting that conversation.  Patient also voiced she is having a procedure on 04/05/2023, so it is going to be awhile before she comes back to the office.  Patient did verbalize the medication Dr. Irene Pap put her on is working and she is very Adult nurse of that.  Patient also stated [facility] never called her to schedule the CT, she stated she called Kathryne Sharper and they informed her of the order sent to [facility].  Patient also verbalized she is dissatisfied with our imaging department not contacting her as should.  I canceled her appointment and wished her a speedy and healthy recovery.

## 2023-03-30 ENCOUNTER — Encounter: Payer: Self-pay | Admitting: Nurse Practitioner

## 2023-03-30 ENCOUNTER — Ambulatory Visit (INDEPENDENT_AMBULATORY_CARE_PROVIDER_SITE_OTHER): Payer: BC Managed Care – PPO | Admitting: Otolaryngology

## 2023-03-31 NOTE — Pre-Procedure Instructions (Signed)
 Surgical Instructions   Your procedure is scheduled on April 05, 2023. Report to [facility] Main Entrance "A" at 6:30 A.M., then check in with the Admitting office. Any questions or running late day of surgery: call [phone]  Questions prior to your surgery date: call [phone], Monday-Friday, 8am-4pm. If you experience any cold or flu symptoms such as cough, fever, chills, shortness of breath, etc. between now and your scheduled surgery, please notify us at the above number.     Remember:  Do not eat or drink after midnight the night before your surgery    Take these medicines the morning of surgery with A SIP OF WATER: cetirizine (ZYRTEC)  fluticasone furoate-vilanterol (BREO ELLIPTA) inhaler isosorbide mononitrate (IMDUR)  levothyroxine (SYNTHROID)  metoprolol succinate (TOPROL-XL)  pantoprazole (PROTONIX)  rosuvastatin (CRESTOR)  Tiotropium Bromide Monohydrate (SPIRIVA RESPIMAT)  venlafaxine XR [facility])    May take these medicines IF NEEDED: Albuterol-Budesonide (AIRSUPRA) inhaler ALPRAZolam (XANAX)  fluticasone (FLONASE) nasal spray  nitroGLYCERIN (NITROSTAT) - if dose taken prior to surgery, please call either of the above phone numbers   Continue taking your Aspirin through the day before surgery. DO NOT take any the morning of surgery.   One week prior to surgery, STOP taking any Aleve, Naproxen, Ibuprofen, Motrin, Advil, Goody's, BC's, all herbal medications, fish oil, and non-prescription vitamins. This includes your medication: celecoxib (CELEBREX)    WHAT DO I DO ABOUT MY DIABETES MEDICATION?   STOP taking your tirzepatide [facility]) one week prior to surgery. DO NOT take any doses after February 11th.   HOW TO MANAGE YOUR DIABETES BEFORE AND AFTER SURGERY  Why is it important to control my blood sugar before and after surgery? Improving blood sugar levels before and after surgery helps healing and can limit problems. A way of improving  blood sugar control is eating a healthy diet by:  Eating less sugar and carbohydrates  Increasing activity/exercise  Talking with your doctor about reaching your blood sugar goals High blood sugars (greater than 180 mg/dL) can raise your risk of infections and slow your recovery, so you will need to focus on controlling your diabetes during the weeks before surgery. Make sure that the doctor who takes care of your diabetes knows about your planned surgery including the date and location.  How do I manage my blood sugar before surgery? Check your blood sugar at least 4 times a day, starting 2 days before surgery, to make sure that the level is not too high or low.  Check your blood sugar the morning of your surgery when you wake up and every 2 hours until you get to the Short Stay unit.  If your blood sugar is less than 70 mg/dL, you will need to treat for low blood sugar: Do not take insulin. Treat a low blood sugar (less than 70 mg/dL) with  cup of clear juice (cranberry or apple), 4 glucose tablets, OR glucose gel. Recheck blood sugar in 15 minutes after treatment (to make sure it is greater than 70 mg/dL). If your blood sugar is not greater than 70 mg/dL on recheck, call [phone] for further instructions. Report your blood sugar to the short stay nurse when you get to Short Stay.  If you are admitted to the hospital after surgery: Your blood sugar will be checked by the staff and you will probably be given insulin after surgery (instead of oral diabetes medicines) to make sure you have good blood sugar levels. The goal for blood sugar control after surgery  is 80-180 mg/dL.                      Do NOT Smoke (Tobacco/Vaping) for 24 hours prior to your procedure.  If you use a CPAP at night, you may bring your mask/headgear for your overnight stay.   You will be asked to remove any contacts, glasses, piercing's, hearing aid's, dentures/partials prior to surgery. Please bring cases  for these items if needed.    Patients discharged the day of surgery will not be allowed to drive home, and someone needs to stay with them for 24 hours.  SURGICAL WAITING ROOM VISITATION Patients may have no more than 2 support people in the waiting area - these visitors may rotate.   Pre-op nurse will coordinate an appropriate time for 1 ADULT support person, who may not rotate, to accompany patient in pre-op.  Children under the age of 83 must have an adult with them who is not the patient and must remain in the main waiting area with an adult.  If the patient needs to stay at the hospital during part of their recovery, the visitor guidelines for inpatient rooms apply.  Please refer to the [facility] website for the visitor guidelines for any additional information.   If you received a COVID test during your pre-op visit  it is requested that you wear a mask when out in public, stay away from anyone that may not be feeling well and notify your surgeon if you develop symptoms. If you have been in contact with anyone that has tested positive in the last 10 days please notify you surgeon.      Pre-operative CHG Bathing Instructions   You can play a key role in reducing the risk of infection after surgery. Your skin needs to be as free of germs as possible. You can reduce the number of germs on your skin by washing with CHG (chlorhexidine gluconate) soap before surgery. CHG is an antiseptic soap that kills germs and continues to kill germs even after washing.   DO NOT use if you have an allergy to chlorhexidine/CHG or antibacterial soaps. If your skin becomes reddened or irritated, stop using the CHG and notify one of our RNs at [phone].              TAKE A SHOWER THE NIGHT BEFORE SURGERY AND THE DAY OF SURGERY    Please keep in mind the following:  DO NOT shave, including legs and underarms, 48 hours prior to surgery.   You may shave your face before/day of surgery.  Place clean  sheets on your bed the night before surgery Use a clean washcloth (not used since being washed) for each shower. DO NOT sleep with pet's night before surgery.  CHG Shower Instructions:  Wash your face and private area with normal soap. If you choose to wash your hair, wash first with your normal shampoo.  After you use shampoo/soap, rinse your hair and body thoroughly to remove shampoo/soap residue.  Turn the water OFF and apply half the bottle of CHG soap to a CLEAN washcloth.  Apply CHG soap ONLY FROM YOUR NECK DOWN TO YOUR TOES (washing for 3-5 minutes)  DO NOT use CHG soap on face, private areas, open wounds, or sores.  Pay special attention to the area where your surgery is being performed.  If you are having back surgery, having someone wash your back for you may be helpful. Wait 2 minutes after CHG  soap is applied, then you may rinse off the CHG soap.  Pat dry with a clean towel  Put on clean pajamas    Additional instructions for the day of surgery: DO NOT APPLY any lotions, deodorants, cologne, or perfumes.   Do not wear jewelry or makeup Do not wear nail polish, gel polish, artificial nails, or any other type of covering on natural nails (fingers and toes) Do not bring valuables to the hospital. [facility] is not responsible for valuables/personal belongings. Put on clean/comfortable clothes.  Please brush your teeth.  Ask your nurse before applying any prescription medications to the skin.

## 2023-04-03 ENCOUNTER — Encounter (HOSPITAL_COMMUNITY): Payer: Self-pay

## 2023-04-03 ENCOUNTER — Encounter (HOSPITAL_COMMUNITY)
Admission: RE | Admit: 2023-04-03 | Discharge: 2023-04-03 | Disposition: A | Discharge status: Home / Self Care | Payer: BC Managed Care – PPO | Source: Ambulatory Visit | Attending: Cardiovascular Disease

## 2023-04-03 ENCOUNTER — Ambulatory Visit (HOSPITAL_BASED_OUTPATIENT_CLINIC_OR_DEPARTMENT_OTHER)
Admission: RE | Admit: 2023-04-03 | Discharge: 2023-04-03 | Disposition: A | Discharge status: Home / Self Care | Payer: BC Managed Care – PPO | Source: Ambulatory Visit | Attending: Thoracic Surgery (Cardiothoracic Vascular Surgery) | Admitting: Thoracic Surgery (Cardiothoracic Vascular Surgery)

## 2023-04-03 ENCOUNTER — Other Ambulatory Visit: Payer: Self-pay

## 2023-04-03 ENCOUNTER — Ambulatory Visit (HOSPITAL_COMMUNITY)
Admission: RE | Admit: 2023-04-03 | Discharge: 2023-04-03 | Disposition: A | Discharge status: Home / Self Care | Payer: BC Managed Care – PPO | Source: Ambulatory Visit | Attending: Thoracic Surgery (Cardiothoracic Vascular Surgery)

## 2023-04-03 VITALS — BP 120/75 | HR 66 | Temp 97.7°F | Resp 17 | Ht 64.0 in | Wt 195.4 lb

## 2023-04-03 DIAGNOSIS — Z79890 Hormone replacement therapy (postmenopausal): Secondary | ICD-10-CM | POA: Diagnosis not present

## 2023-04-03 DIAGNOSIS — J9811 Atelectasis: Secondary | ICD-10-CM | POA: Diagnosis not present

## 2023-04-03 DIAGNOSIS — K7581 Nonalcoholic steatohepatitis (NASH): Secondary | ICD-10-CM | POA: Diagnosis not present

## 2023-04-03 DIAGNOSIS — Z8249 Family history of ischemic heart disease and other diseases of the circulatory system: Secondary | ICD-10-CM | POA: Diagnosis not present

## 2023-04-03 DIAGNOSIS — Z7985 Long-term (current) use of injectable non-insulin antidiabetic drugs: Secondary | ICD-10-CM | POA: Diagnosis not present

## 2023-04-03 DIAGNOSIS — J4540 Moderate persistent asthma, uncomplicated: Secondary | ICD-10-CM | POA: Diagnosis not present

## 2023-04-03 DIAGNOSIS — Z01818 Encounter for other preprocedural examination: Secondary | ICD-10-CM | POA: Insufficient documentation

## 2023-04-03 DIAGNOSIS — R0989 Other specified symptoms and signs involving the circulatory and respiratory systems: Secondary | ICD-10-CM | POA: Diagnosis not present

## 2023-04-03 DIAGNOSIS — J984 Other disorders of lung: Secondary | ICD-10-CM | POA: Diagnosis not present

## 2023-04-03 DIAGNOSIS — R140 Abdominal distension (gaseous): Secondary | ICD-10-CM | POA: Diagnosis not present

## 2023-04-03 DIAGNOSIS — Z79899 Other long term (current) drug therapy: Secondary | ICD-10-CM | POA: Diagnosis not present

## 2023-04-03 DIAGNOSIS — Z887 Allergy status to serum and vaccine status: Secondary | ICD-10-CM | POA: Diagnosis not present

## 2023-04-03 DIAGNOSIS — K760 Fatty (change of) liver, not elsewhere classified: Secondary | ICD-10-CM | POA: Diagnosis not present

## 2023-04-03 DIAGNOSIS — Z823 Family history of stroke: Secondary | ICD-10-CM | POA: Diagnosis not present

## 2023-04-03 DIAGNOSIS — Z452 Encounter for adjustment and management of vascular access device: Secondary | ICD-10-CM | POA: Diagnosis not present

## 2023-04-03 DIAGNOSIS — Z4682 Encounter for fitting and adjustment of non-vascular catheter: Secondary | ICD-10-CM | POA: Diagnosis not present

## 2023-04-03 DIAGNOSIS — E039 Hypothyroidism, unspecified: Secondary | ICD-10-CM | POA: Diagnosis not present

## 2023-04-03 DIAGNOSIS — E785 Hyperlipidemia, unspecified: Secondary | ICD-10-CM | POA: Diagnosis not present

## 2023-04-03 DIAGNOSIS — Z6834 Body mass index (BMI) 34.0-34.9, adult: Secondary | ICD-10-CM | POA: Diagnosis not present

## 2023-04-03 DIAGNOSIS — J939 Pneumothorax, unspecified: Secondary | ICD-10-CM | POA: Diagnosis not present

## 2023-04-03 DIAGNOSIS — E1169 Type 2 diabetes mellitus with other specified complication: Secondary | ICD-10-CM | POA: Diagnosis not present

## 2023-04-03 DIAGNOSIS — Z48812 Encounter for surgical aftercare following surgery on the circulatory system: Secondary | ICD-10-CM | POA: Diagnosis not present

## 2023-04-03 DIAGNOSIS — J90 Pleural effusion, not elsewhere classified: Secondary | ICD-10-CM | POA: Diagnosis not present

## 2023-04-03 DIAGNOSIS — I25110 Atherosclerotic heart disease of native coronary artery with unstable angina pectoris: Secondary | ICD-10-CM

## 2023-04-03 DIAGNOSIS — Z9889 Other specified postprocedural states: Secondary | ICD-10-CM | POA: Diagnosis not present

## 2023-04-03 DIAGNOSIS — I2510 Atherosclerotic heart disease of native coronary artery without angina pectoris: Secondary | ICD-10-CM | POA: Diagnosis not present

## 2023-04-03 DIAGNOSIS — D72829 Elevated white blood cell count, unspecified: Secondary | ICD-10-CM | POA: Diagnosis not present

## 2023-04-03 DIAGNOSIS — F419 Anxiety disorder, unspecified: Secondary | ICD-10-CM | POA: Diagnosis not present

## 2023-04-03 DIAGNOSIS — E119 Type 2 diabetes mellitus without complications: Secondary | ICD-10-CM | POA: Diagnosis not present

## 2023-04-03 DIAGNOSIS — I700 Atherosclerosis of aorta: Secondary | ICD-10-CM | POA: Diagnosis not present

## 2023-04-03 DIAGNOSIS — K219 Gastro-esophageal reflux disease without esophagitis: Secondary | ICD-10-CM | POA: Diagnosis not present

## 2023-04-03 DIAGNOSIS — R918 Other nonspecific abnormal finding of lung field: Secondary | ICD-10-CM | POA: Diagnosis not present

## 2023-04-03 DIAGNOSIS — Z833 Family history of diabetes mellitus: Secondary | ICD-10-CM | POA: Diagnosis not present

## 2023-04-03 DIAGNOSIS — E1151 Type 2 diabetes mellitus with diabetic peripheral angiopathy without gangrene: Secondary | ICD-10-CM | POA: Diagnosis not present

## 2023-04-03 DIAGNOSIS — Z95820 Peripheral vascular angioplasty status with implants and grafts: Secondary | ICD-10-CM | POA: Diagnosis not present

## 2023-04-03 DIAGNOSIS — Z87891 Personal history of nicotine dependence: Secondary | ICD-10-CM | POA: Diagnosis not present

## 2023-04-03 DIAGNOSIS — E669 Obesity, unspecified: Secondary | ICD-10-CM | POA: Diagnosis not present

## 2023-04-03 DIAGNOSIS — F32A Depression, unspecified: Secondary | ICD-10-CM | POA: Diagnosis not present

## 2023-04-03 DIAGNOSIS — I10 Essential (primary) hypertension: Secondary | ICD-10-CM | POA: Diagnosis not present

## 2023-04-03 DIAGNOSIS — Z951 Presence of aortocoronary bypass graft: Secondary | ICD-10-CM | POA: Diagnosis not present

## 2023-04-03 HISTORY — DX: Headache, unspecified: R51.9

## 2023-04-03 HISTORY — DX: Anxiety disorder, unspecified: F41.9

## 2023-04-03 HISTORY — DX: Pneumonia, unspecified organism: J18.9

## 2023-04-03 HISTORY — DX: Other complications of anesthesia, initial encounter: T88.59XA

## 2023-04-03 HISTORY — DX: Atherosclerotic heart disease of native coronary artery without angina pectoris: I25.10

## 2023-04-03 HISTORY — DX: Gastro-esophageal reflux disease without esophagitis: K21.9

## 2023-04-03 HISTORY — DX: Unspecified osteoarthritis, unspecified site: M19.90

## 2023-04-03 LAB — COMPREHENSIVE METABOLIC PANEL
ALT: 40 U/L (ref 0–44)
AST: 29 U/L (ref 15–41)
Albumin: 3.6 g/dL (ref 3.5–5.0)
Alkaline Phosphatase: 75 U/L (ref 38–126)
Anion gap: 9 (ref 5–15)
BUN: 16 mg/dL (ref 8–23)
CO2: 24 mmol/L (ref 22–32)
Calcium: 9.2 mg/dL (ref 8.9–10.3)
Chloride: 105 mmol/L (ref 98–111)
Creatinine, Ser: 0.88 mg/dL (ref 0.44–1.00)
GFR, Estimated: >60 mL/min (ref >60)
Glucose, Bld: 169 mg/dL — ABNORMAL HIGH (ref 70–99)
Potassium: 4.3 mmol/L (ref 3.5–5.1)
Sodium: 138 mmol/L (ref 135–145)
Total Bilirubin: 0.7 mg/dL (ref 0.0–1.2)
Total Protein: 6.9 g/dL (ref 6.5–8.1)

## 2023-04-03 LAB — SURGICAL PCR SCREEN
MRSA, PCR: NEGATIVE
Staphylococcus aureus: POSITIVE — AB

## 2023-04-03 LAB — APTT: aPTT: 30 s (ref 24–36)

## 2023-04-03 LAB — URINALYSIS, ROUTINE W REFLEX MICROSCOPIC
Bilirubin Urine: NEGATIVE
Glucose, UA: NEGATIVE mg/dL
Hgb urine dipstick: NEGATIVE
Ketones, ur: NEGATIVE mg/dL
Leukocytes,Ua: NEGATIVE
Nitrite: NEGATIVE
Protein, ur: NEGATIVE mg/dL
Specific Gravity, Urine: 1.016 (ref 1.005–1.030)
pH: 5 (ref 5.0–8.0)

## 2023-04-03 LAB — CBC
HCT: 44.3 % (ref 36.0–46.0)
Hemoglobin: 15 g/dL (ref 12.0–15.0)
MCH: 30.5 pg (ref 26.0–34.0)
MCHC: 33.9 g/dL (ref 30.0–36.0)
MCV: 90.2 fL (ref 80.0–100.0)
Platelets: 161 10*3/uL (ref 150–400)
RBC: 4.91 MIL/uL (ref 3.87–5.11)
RDW: 12.6 % (ref 11.5–15.5)
WBC: 11.1 10*3/uL — ABNORMAL HIGH (ref 4.0–10.5)
nRBC: 0 % (ref 0.0–0.2)

## 2023-04-03 LAB — TYPE AND SCREEN
ABO/RH(D): O POS
Antibody Screen: NEGATIVE

## 2023-04-03 LAB — VAS US DOPPLER PRE CABG

## 2023-04-03 LAB — PROTIME-INR
INR: 1 (ref 0.8–1.2)
Prothrombin Time: 13.8 s (ref 11.4–15.2)

## 2023-04-03 LAB — GLUCOSE, CAPILLARY: Glucose-Capillary: 179 mg/dL — ABNORMAL HIGH (ref 70–99)

## 2023-04-03 LAB — HEMOGLOBIN A1C
Hgb A1c MFr Bld: 6.1 % — ABNORMAL HIGH (ref 4.8–5.6)
Mean Plasma Glucose: 128.37 mg/dL

## 2023-04-03 NOTE — Progress Notes (Signed)
 PCP - Enid Skeens, NP Cardiologist - Dr. Nanetta Batty - Last office visit 03/15/2023  PPM/ICD - Denies Device Orders - n/a Rep Notified - n/a  Chest x-ray - 04/03/2023 EKG - 04/03/2023 Stress Test - 10/01/2019 ECHO - 02/28/2023 Cardiac Cath - 03/06/2023  Sleep Study - Denies. STOPBANG score 5 and patients PCP made aware CPAP - n/a  Pt is a DM2. She has a Dexcom on her left upper arm. Normal fasting ranges from 100-150. CBG at pre-op 179. A1c result pending.  Last dose of GLP1 agonist-  Last dose of Mounjaro 2/7 GLP1 instructions: Pt instructed to continue holding medication until after surgery  Blood Thinner Instructions: n/a Aspirin Instructions: Pt instructed to continue taking ASA through the day before surgery and none morning of surgery  NPO after midnight  COVID TEST- n/a   Anesthesia review: Yes. Hx of HTN, CAD, Asthmatic Bronchitis, DM2. Pt took one dose of nitroglycerin on 2/15 with resolution of symptoms. She also notes having bronchitis 1/19 with resolution of symptoms one week ago. Pt does have chronic sinusitis and is pending a CT of sinuses. Patients husband was diagnosed with COVID 1/24 and pt was tested for Flu and COVID on 2/5 and all was negative.   Patient denies shortness of breath, fever, cough and chest pain at PAT appointment. Other than what is listed above, pt denies any respiratory illness/infection in the last two months.    All instructions explained to the patient, with a verbal understanding of the material. Patient agrees to go over the instructions while at home for a better understanding. Patient also instructed to self quarantine after being tested for COVID-19. The opportunity to ask questions was provided.

## 2023-04-04 ENCOUNTER — Telehealth: Payer: Self-pay

## 2023-04-04 MED ORDER — POTASSIUM CHLORIDE 2 MEQ/ML IV SOLN
80.0000 meq | INTRAVENOUS | Status: DC
Start: 2023-04-05 — End: 2023-04-06
  Filled 2023-04-04: qty 40

## 2023-04-04 MED ORDER — MILRINONE LACTATE IN DEXTROSE 20-5 MG/100ML-% IV SOLN
0.3000 ug/kg/min | INTRAVENOUS | Status: DC
  Filled 2023-04-04: qty 100

## 2023-04-04 MED ORDER — PHENYLEPHRINE HCL-NACL 20-0.9 MG/250ML-% IV SOLN
30.0000 ug/min | INTRAVENOUS | Status: DC
  Filled 2023-04-04: qty 250

## 2023-04-04 MED ORDER — VANCOMYCIN HCL 1.5 G IV SOLR
1500.0000 mg | INTRAVENOUS | Status: AC
  Administered 2023-04-05: 1500 mg via INTRAVENOUS
  Filled 2023-04-04: qty 30

## 2023-04-04 MED ORDER — NITROGLYCERIN IN D5W 200-5 MCG/ML-% IV SOLN
2.0000 ug/min | INTRAVENOUS | Status: DC
  Filled 2023-04-04: qty 250

## 2023-04-04 MED ORDER — PLASMA-LYTE A IV SOLN
INTRAVENOUS | Status: DC
  Filled 2023-04-04: qty 2.5

## 2023-04-04 MED ORDER — EPINEPHRINE HCL 5 MG/250ML IV SOLN IN NS
0.0000 ug/min | INTRAVENOUS | Status: DC
  Filled 2023-04-04: qty 250

## 2023-04-04 MED ORDER — MANNITOL 20 % IV SOLN
INTRAVENOUS | Status: DC
  Filled 2023-04-04: qty 13

## 2023-04-04 MED ORDER — DEXMEDETOMIDINE HCL IN NACL 400 MCG/100ML IV SOLN
0.1000 ug/kg/h | INTRAVENOUS | Status: AC
  Administered 2023-04-05: .4 ug/kg/h via INTRAVENOUS
  Filled 2023-04-04: qty 100

## 2023-04-04 MED ORDER — HEPARIN 30,000 UNITS/1000 ML (OHS) CELLSAVER SOLUTION
Status: DC
  Filled 2023-04-04: qty 1000

## 2023-04-04 MED ORDER — TRANEXAMIC ACID (OHS) BOLUS VIA INFUSION
15.0000 mg/kg | INTRAVENOUS | Status: AC
  Administered 2023-04-05: 1329 mg via INTRAVENOUS
  Filled 2023-04-04: qty 1329

## 2023-04-04 MED ORDER — CEFAZOLIN SODIUM-DEXTROSE 2-4 GM/100ML-% IV SOLN
2.0000 g | INTRAVENOUS | Status: DC
  Filled 2023-04-04: qty 100

## 2023-04-04 MED ORDER — NOREPINEPHRINE 4 MG/250ML-% IV SOLN
0.0000 ug/min | INTRAVENOUS | Status: AC
  Administered 2023-04-05: 2 ug/min via INTRAVENOUS
  Filled 2023-04-04: qty 250

## 2023-04-04 MED ORDER — TRANEXAMIC ACID (OHS) PUMP PRIME SOLUTION
2.0000 mg/kg | INTRAVENOUS | Status: DC
  Filled 2023-04-04: qty 1.77

## 2023-04-04 MED ORDER — INSULIN REGULAR(HUMAN) IN NACL 100-0.9 UT/100ML-% IV SOLN
INTRAVENOUS | Status: AC
  Administered 2023-04-05: 6.5 [IU]/h via INTRAVENOUS
  Filled 2023-04-04: qty 100

## 2023-04-04 MED ORDER — CEFAZOLIN SODIUM-DEXTROSE 2-4 GM/100ML-% IV SOLN
2.0000 g | INTRAVENOUS | Status: AC
  Administered 2023-04-05: 2 g via INTRAVENOUS
  Filled 2023-04-04: qty 100

## 2023-04-04 MED ORDER — TRANEXAMIC ACID 1000 MG/10ML IV SOLN
1.5000 mg/kg/h | INTRAVENOUS | Status: AC
  Administered 2023-04-05: 1.5 mg/kg/h via INTRAVENOUS
  Filled 2023-04-04: qty 25

## 2023-04-04 NOTE — Hospital Course (Addendum)
 HPI: This is a 63 year old female with a past medical history of hypertension, hyperlipidemia, diabetes mellitus, and remote tobacco abuse who was found to have multivessel coronary artery disease. Per medical record, she developed chest pain approximately 6 months ago occurring several times a month characterized as pain in her chest rating to her jaw and left upper extremity.Because of fairly classic symptoms of chest pain, the cardiologist recommended outpatient diagnostic catheterization which was performed 03/06/2023 by Dr. Okey Dupre.  Results of the catheterization revealed three-vessel coronary artery disease  Dr.  Cliffton Asters evaluated the patient in the office on 03/24/2023. He discussed the need for coronary artery bypass grafting surgery. Potential risks, benefits, and complications of the surgery were discussed with the patient and she agreed to proceed with surgery. Pre operative carotid duplex US showed a 40-59% right internal carotid artery stenosis and no significant left internal carotid artery stenosis.   Hospital Course: Patient underwent a CABG x **.

## 2023-04-04 NOTE — Progress Notes (Signed)
 Pre-surgery CXR not read at 1348 on 04/04/23. RN called Radiology Reading Room to request imaging be read by pts surgery tomorrow morning, February 19th.

## 2023-04-04 NOTE — Telephone Encounter (Signed)
 FMLA/ [employer] filled out per patient's request. Contacted patient and made aware she/family can pick up documents at front desk. LOA 04/05/23- 07/03/23.

## 2023-04-05 ENCOUNTER — Inpatient Hospital Stay (HOSPITAL_COMMUNITY)
Admission: RE | Admit: 2023-04-05 | Discharge: 2023-04-09 | DRG: 236 | Disposition: A | Discharge status: Home / Self Care | Payer: BC Managed Care – PPO | Attending: Thoracic Surgery (Cardiothoracic Vascular Surgery) | Admitting: Thoracic Surgery (Cardiothoracic Vascular Surgery)

## 2023-04-05 ENCOUNTER — Encounter (HOSPITAL_COMMUNITY): Payer: Self-pay | Admitting: Thoracic Surgery (Cardiothoracic Vascular Surgery)

## 2023-04-05 ENCOUNTER — Inpatient Hospital Stay (HOSPITAL_COMMUNITY)
Admission: RE | Disposition: A | Discharge status: Home / Self Care | Payer: Self-pay | Source: Home / Self Care | Attending: Thoracic Surgery (Cardiothoracic Vascular Surgery)

## 2023-04-05 ENCOUNTER — Other Ambulatory Visit (HOSPITAL_COMMUNITY): Payer: Self-pay | Admitting: *Deleted

## 2023-04-05 ENCOUNTER — Other Ambulatory Visit: Payer: Self-pay

## 2023-04-05 ENCOUNTER — Inpatient Hospital Stay (HOSPITAL_COMMUNITY): Payer: Self-pay | Admitting: Physician Assistant

## 2023-04-05 ENCOUNTER — Inpatient Hospital Stay (HOSPITAL_COMMUNITY): Payer: Self-pay | Admitting: Certified Registered Nurse Anesthetist

## 2023-04-05 ENCOUNTER — Inpatient Hospital Stay (HOSPITAL_COMMUNITY): Payer: BC Managed Care – PPO

## 2023-04-05 DIAGNOSIS — E669 Obesity, unspecified: Secondary | ICD-10-CM | POA: Diagnosis present

## 2023-04-05 DIAGNOSIS — K219 Gastro-esophageal reflux disease without esophagitis: Secondary | ICD-10-CM | POA: Diagnosis present

## 2023-04-05 DIAGNOSIS — Z8041 Family history of malignant neoplasm of ovary: Secondary | ICD-10-CM

## 2023-04-05 DIAGNOSIS — Z7982 Long term (current) use of aspirin: Secondary | ICD-10-CM

## 2023-04-05 DIAGNOSIS — J939 Pneumothorax, unspecified: Secondary | ICD-10-CM | POA: Diagnosis not present

## 2023-04-05 DIAGNOSIS — J90 Pleural effusion, not elsewhere classified: Secondary | ICD-10-CM | POA: Diagnosis not present

## 2023-04-05 DIAGNOSIS — I10 Essential (primary) hypertension: Secondary | ICD-10-CM | POA: Diagnosis present

## 2023-04-05 DIAGNOSIS — I2510 Atherosclerotic heart disease of native coronary artery without angina pectoris: Principal | ICD-10-CM | POA: Diagnosis present

## 2023-04-05 DIAGNOSIS — Z91018 Allergy to other foods: Secondary | ICD-10-CM

## 2023-04-05 DIAGNOSIS — K760 Fatty (change of) liver, not elsewhere classified: Secondary | ICD-10-CM | POA: Diagnosis present

## 2023-04-05 DIAGNOSIS — Z823 Family history of stroke: Secondary | ICD-10-CM

## 2023-04-05 DIAGNOSIS — Z7985 Long-term (current) use of injectable non-insulin antidiabetic drugs: Secondary | ICD-10-CM | POA: Diagnosis not present

## 2023-04-05 DIAGNOSIS — E039 Hypothyroidism, unspecified: Secondary | ICD-10-CM | POA: Diagnosis present

## 2023-04-05 DIAGNOSIS — Z833 Family history of diabetes mellitus: Secondary | ICD-10-CM

## 2023-04-05 DIAGNOSIS — Z6834 Body mass index (BMI) 34.0-34.9, adult: Secondary | ICD-10-CM | POA: Diagnosis not present

## 2023-04-05 DIAGNOSIS — Z79899 Other long term (current) drug therapy: Secondary | ICD-10-CM

## 2023-04-05 DIAGNOSIS — Z83438 Family history of other disorder of lipoprotein metabolism and other lipidemia: Secondary | ICD-10-CM

## 2023-04-05 DIAGNOSIS — R140 Abdominal distension (gaseous): Secondary | ICD-10-CM | POA: Diagnosis not present

## 2023-04-05 DIAGNOSIS — E119 Type 2 diabetes mellitus without complications: Secondary | ICD-10-CM

## 2023-04-05 DIAGNOSIS — J4540 Moderate persistent asthma, uncomplicated: Secondary | ICD-10-CM | POA: Diagnosis present

## 2023-04-05 DIAGNOSIS — I25110 Atherosclerotic heart disease of native coronary artery with unstable angina pectoris: Secondary | ICD-10-CM

## 2023-04-05 DIAGNOSIS — K7581 Nonalcoholic steatohepatitis (NASH): Secondary | ICD-10-CM | POA: Diagnosis not present

## 2023-04-05 DIAGNOSIS — F32A Depression, unspecified: Secondary | ICD-10-CM | POA: Diagnosis present

## 2023-04-05 DIAGNOSIS — D72829 Elevated white blood cell count, unspecified: Secondary | ICD-10-CM | POA: Diagnosis not present

## 2023-04-05 DIAGNOSIS — R918 Other nonspecific abnormal finding of lung field: Secondary | ICD-10-CM | POA: Diagnosis not present

## 2023-04-05 DIAGNOSIS — Z951 Presence of aortocoronary bypass graft: Secondary | ICD-10-CM | POA: Diagnosis not present

## 2023-04-05 DIAGNOSIS — Z95820 Peripheral vascular angioplasty status with implants and grafts: Secondary | ICD-10-CM | POA: Diagnosis not present

## 2023-04-05 DIAGNOSIS — F419 Anxiety disorder, unspecified: Secondary | ICD-10-CM | POA: Diagnosis present

## 2023-04-05 DIAGNOSIS — Z87891 Personal history of nicotine dependence: Secondary | ICD-10-CM | POA: Diagnosis not present

## 2023-04-05 DIAGNOSIS — R0989 Other specified symptoms and signs involving the circulatory and respiratory systems: Secondary | ICD-10-CM | POA: Diagnosis not present

## 2023-04-05 DIAGNOSIS — Z452 Encounter for adjustment and management of vascular access device: Secondary | ICD-10-CM | POA: Diagnosis not present

## 2023-04-05 DIAGNOSIS — Z811 Family history of alcohol abuse and dependence: Secondary | ICD-10-CM

## 2023-04-05 DIAGNOSIS — E785 Hyperlipidemia, unspecified: Secondary | ICD-10-CM | POA: Diagnosis present

## 2023-04-05 DIAGNOSIS — Z887 Allergy status to serum and vaccine status: Secondary | ICD-10-CM | POA: Diagnosis not present

## 2023-04-05 DIAGNOSIS — J9811 Atelectasis: Secondary | ICD-10-CM | POA: Diagnosis not present

## 2023-04-05 DIAGNOSIS — J984 Other disorders of lung: Secondary | ICD-10-CM | POA: Diagnosis not present

## 2023-04-05 DIAGNOSIS — Z9911 Dependence on respirator [ventilator] status: Secondary | ICD-10-CM

## 2023-04-05 DIAGNOSIS — E1151 Type 2 diabetes mellitus with diabetic peripheral angiopathy without gangrene: Secondary | ICD-10-CM | POA: Diagnosis present

## 2023-04-05 DIAGNOSIS — Z888 Allergy status to other drugs, medicaments and biological substances status: Secondary | ICD-10-CM

## 2023-04-05 DIAGNOSIS — Z79890 Hormone replacement therapy (postmenopausal): Secondary | ICD-10-CM | POA: Diagnosis not present

## 2023-04-05 DIAGNOSIS — Z7951 Long term (current) use of inhaled steroids: Secondary | ICD-10-CM

## 2023-04-05 DIAGNOSIS — Z791 Long term (current) use of non-steroidal anti-inflammatories (NSAID): Secondary | ICD-10-CM

## 2023-04-05 DIAGNOSIS — Z48812 Encounter for surgical aftercare following surgery on the circulatory system: Secondary | ICD-10-CM | POA: Diagnosis not present

## 2023-04-05 DIAGNOSIS — E1169 Type 2 diabetes mellitus with other specified complication: Secondary | ICD-10-CM | POA: Diagnosis present

## 2023-04-05 DIAGNOSIS — Z4682 Encounter for fitting and adjustment of non-vascular catheter: Secondary | ICD-10-CM | POA: Diagnosis not present

## 2023-04-05 DIAGNOSIS — Z8249 Family history of ischemic heart disease and other diseases of the circulatory system: Secondary | ICD-10-CM

## 2023-04-05 HISTORY — PX: TEE WITHOUT CARDIOVERSION: SHX5443

## 2023-04-05 HISTORY — PX: CORONARY ARTERY BYPASS GRAFT: SHX141

## 2023-04-05 HISTORY — DX: Dependence on respirator (ventilator) status: Z99.11

## 2023-04-05 LAB — POCT I-STAT, CHEM 8
BUN: 15 mg/dL (ref 8–23)
BUN: 14 mg/dL (ref 8–23)
BUN: 12 mg/dL (ref 8–23)
Calcium, Ion: 1.29 mmol/L (ref 1.15–1.40)
Calcium, Ion: 1.24 mmol/L (ref 1.15–1.40)
Calcium, Ion: 1.22 mmol/L (ref 1.15–1.40)
Chloride: 104 mmol/L (ref 98–111)
Chloride: 105 mmol/L (ref 98–111)
Chloride: 106 mmol/L (ref 98–111)
Creatinine, Ser: 0.7 mg/dL (ref 0.44–1.00)
Creatinine, Ser: 0.7 mg/dL (ref 0.44–1.00)
Creatinine, Ser: 0.6 mg/dL (ref 0.44–1.00)
Glucose, Bld: 178 mg/dL — ABNORMAL HIGH (ref 70–99)
Glucose, Bld: 176 mg/dL — ABNORMAL HIGH (ref 70–99)
Glucose, Bld: 132 mg/dL — ABNORMAL HIGH (ref 70–99)
HCT: 38 % (ref 36.0–46.0)
HCT: 35 % — ABNORMAL LOW (ref 36.0–46.0)
HCT: 30 % — ABNORMAL LOW (ref 36.0–46.0)
Hemoglobin: 12.9 g/dL (ref 12.0–15.0)
Hemoglobin: 11.9 g/dL — ABNORMAL LOW (ref 12.0–15.0)
Hemoglobin: 10.2 g/dL — ABNORMAL LOW (ref 12.0–15.0)
Potassium: 3.7 mmol/L (ref 3.5–5.1)
Potassium: 3.9 mmol/L (ref 3.5–5.1)
Potassium: 3.5 mmol/L (ref 3.5–5.1)
Sodium: 139 mmol/L (ref 135–145)
Sodium: 138 mmol/L (ref 135–145)
Sodium: 140 mmol/L (ref 135–145)
TCO2: 26 mmol/L (ref 22–32)
TCO2: 23 mmol/L (ref 22–32)
TCO2: 22 mmol/L (ref 22–32)

## 2023-04-05 LAB — CBC
HCT: 33.7 % — ABNORMAL LOW (ref 36.0–46.0)
HCT: 36.8 % (ref 36.0–46.0)
Hemoglobin: 11.6 g/dL — ABNORMAL LOW (ref 12.0–15.0)
Hemoglobin: 12.5 g/dL (ref 12.0–15.0)
MCH: 30.8 pg (ref 26.0–34.0)
MCH: 30.6 pg (ref 26.0–34.0)
MCHC: 34.4 g/dL (ref 30.0–36.0)
MCHC: 34 g/dL (ref 30.0–36.0)
MCV: 89.4 fL (ref 80.0–100.0)
MCV: 90.2 fL (ref 80.0–100.0)
Platelets: 119 10*3/uL — ABNORMAL LOW (ref 150–400)
Platelets: 132 10*3/uL — ABNORMAL LOW (ref 150–400)
RBC: 3.77 MIL/uL — ABNORMAL LOW (ref 3.87–5.11)
RBC: 4.08 MIL/uL (ref 3.87–5.11)
RDW: 12.5 % (ref 11.5–15.5)
RDW: 12.5 % (ref 11.5–15.5)
WBC: 22.3 10*3/uL — ABNORMAL HIGH (ref 4.0–10.5)
WBC: 14.7 10*3/uL — ABNORMAL HIGH (ref 4.0–10.5)
nRBC: 0 % (ref 0.0–0.2)
nRBC: 0 % (ref 0.0–0.2)

## 2023-04-05 LAB — POCT I-STAT 7, (LYTES, BLD GAS, ICA,H+H)
Acid-base deficit: 7 mmol/L — ABNORMAL HIGH (ref 0.0–2.0)
Acid-base deficit: 5 mmol/L — ABNORMAL HIGH (ref 0.0–2.0)
Acid-base deficit: 1 mmol/L (ref 0.0–2.0)
Bicarbonate: 18.6 mmol/L — ABNORMAL LOW (ref 20.0–28.0)
Bicarbonate: 21.2 mmol/L (ref 20.0–28.0)
Bicarbonate: 25.8 mmol/L (ref 20.0–28.0)
Calcium, Ion: 1.16 mmol/L (ref 1.15–1.40)
Calcium, Ion: 1.21 mmol/L (ref 1.15–1.40)
Calcium, Ion: 1.2 mmol/L (ref 1.15–1.40)
HCT: 30 % — ABNORMAL LOW (ref 36.0–46.0)
HCT: 31 % — ABNORMAL LOW (ref 36.0–46.0)
HCT: 33 % — ABNORMAL LOW (ref 36.0–46.0)
Hemoglobin: 10.2 g/dL — ABNORMAL LOW (ref 12.0–15.0)
Hemoglobin: 10.5 g/dL — ABNORMAL LOW (ref 12.0–15.0)
Hemoglobin: 11.2 g/dL — ABNORMAL LOW (ref 12.0–15.0)
O2 Saturation: 99 %
O2 Saturation: 98 %
O2 Saturation: 95 %
Patient temperature: 35
Patient temperature: 35.6
Patient temperature: 36.3
Potassium: 3.2 mmol/L — ABNORMAL LOW (ref 3.5–5.1)
Potassium: 4.2 mmol/L (ref 3.5–5.1)
Potassium: 4.4 mmol/L (ref 3.5–5.1)
Sodium: 141 mmol/L (ref 135–145)
Sodium: 140 mmol/L (ref 135–145)
Sodium: 142 mmol/L (ref 135–145)
TCO2: 20 mmol/L — ABNORMAL LOW (ref 22–32)
TCO2: 23 mmol/L (ref 22–32)
TCO2: 27 mmol/L (ref 22–32)
pCO2 arterial: 34.1 mm[Hg] (ref 32–48)
pCO2 arterial: 41.9 mm[Hg] (ref 32–48)
pCO2 arterial: 47.5 mm[Hg] (ref 32–48)
pH, Arterial: 7.336 — ABNORMAL LOW (ref 7.35–7.45)
pH, Arterial: 7.306 — ABNORMAL LOW (ref 7.35–7.45)
pH, Arterial: 7.341 — ABNORMAL LOW (ref 7.35–7.45)
pO2, Arterial: 121 mm[Hg] — ABNORMAL HIGH (ref 83–108)
pO2, Arterial: 100 mm[Hg] (ref 83–108)
pO2, Arterial: 77 mm[Hg] — ABNORMAL LOW (ref 83–108)

## 2023-04-05 LAB — BASIC METABOLIC PANEL
Anion gap: 6 (ref 5–15)
BUN: 11 mg/dL (ref 8–23)
CO2: 24 mmol/L (ref 22–32)
Calcium: 7.4 mg/dL — ABNORMAL LOW (ref 8.9–10.3)
Chloride: 111 mmol/L (ref 98–111)
Creatinine, Ser: 0.68 mg/dL (ref 0.44–1.00)
GFR, Estimated: >60 mL/min (ref >60)
Glucose, Bld: 144 mg/dL — ABNORMAL HIGH (ref 70–99)
Potassium: 4 mmol/L (ref 3.5–5.1)
Sodium: 141 mmol/L (ref 135–145)

## 2023-04-05 LAB — APTT: aPTT: 33 s (ref 24–36)

## 2023-04-05 LAB — MAGNESIUM: Magnesium: 2.6 mg/dL — ABNORMAL HIGH (ref 1.7–2.4)

## 2023-04-05 LAB — GLUCOSE, CAPILLARY
Glucose-Capillary: 159 mg/dL — ABNORMAL HIGH (ref 70–99)
Glucose-Capillary: 107 mg/dL — ABNORMAL HIGH (ref 70–99)
Glucose-Capillary: 144 mg/dL — ABNORMAL HIGH (ref 70–99)
Glucose-Capillary: 158 mg/dL — ABNORMAL HIGH (ref 70–99)
Glucose-Capillary: 132 mg/dL — ABNORMAL HIGH (ref 70–99)
Glucose-Capillary: 142 mg/dL — ABNORMAL HIGH (ref 70–99)
Glucose-Capillary: 137 mg/dL — ABNORMAL HIGH (ref 70–99)
Glucose-Capillary: 146 mg/dL — ABNORMAL HIGH (ref 70–99)
Glucose-Capillary: 154 mg/dL — ABNORMAL HIGH (ref 70–99)

## 2023-04-05 LAB — PROTIME-INR
INR: 1.4 — ABNORMAL HIGH (ref 0.8–1.2)
Prothrombin Time: 16.9 s — ABNORMAL HIGH (ref 11.4–15.2)

## 2023-04-05 SURGERY — OFF PUMP CORONARY ARTERY BYPASS GRAFTING (CABG)
Anesthesia: General | Site: Chest

## 2023-04-05 MED ORDER — ASPIRIN 325 MG PO TBEC
325.0000 mg | DELAYED_RELEASE_TABLET | Freq: Every day | ORAL | Status: DC
  Administered 2023-04-06 – 2023-04-09 (×4): 325 mg via ORAL
  Filled 2023-04-05 (×4): qty 1

## 2023-04-05 MED ORDER — ALBUMIN HUMAN 5 % IV SOLN
250.0000 mL | INTRAVENOUS | Status: DC | PRN
  Administered 2023-04-05 (×2): 12.5 g via INTRAVENOUS

## 2023-04-05 MED ORDER — CHLORHEXIDINE GLUCONATE 0.12 % MT SOLN
15.0000 mL | Freq: Once | OROMUCOSAL | Status: DC
Start: 2023-04-06 — End: 2023-04-05
  Filled 2023-04-05: qty 15

## 2023-04-05 MED ORDER — OXYCODONE HCL 5 MG PO TABS
5.0000 mg | ORAL_TABLET | ORAL | Status: DC | PRN
  Administered 2023-04-05: 10 mg via ORAL
  Administered 2023-04-06 (×3): 5 mg via ORAL
  Administered 2023-04-07: 10 mg via ORAL
  Administered 2023-04-07: 5 mg via ORAL
  Administered 2023-04-08 (×2): 10 mg via ORAL
  Filled 2023-04-05 (×2): qty 2
  Filled 2023-04-05 (×3): qty 1
  Filled 2023-04-05 (×2): qty 2
  Filled 2023-04-05: qty 1

## 2023-04-05 MED ORDER — SODIUM CHLORIDE 0.9% FLUSH: 10.0000 mL | INTRAVENOUS | Status: DC | PRN

## 2023-04-05 MED ORDER — ONDANSETRON HCL 4 MG/2ML IJ SOLN: 4.0000 mg | Freq: Four times a day (QID) | INTRAMUSCULAR | Status: DC | PRN

## 2023-04-05 MED ORDER — MIDAZOLAM HCL (PF) 10 MG/2ML IJ SOLN
INTRAMUSCULAR | Status: AC
Start: 2023-04-05 — End: ?
  Filled 2023-04-05: qty 2

## 2023-04-05 MED ORDER — DEXTROSE 50 % IV SOLN: 0.0000 mL | INTRAVENOUS | Status: DC | PRN

## 2023-04-05 MED ORDER — SODIUM CHLORIDE 0.9% FLUSH: 3.0000 mL | INTRAVENOUS | Status: DC | PRN

## 2023-04-05 MED ORDER — SODIUM CHLORIDE 0.9 % IV SOLN: 250.0000 mL | INTRAVENOUS | Status: AC

## 2023-04-05 MED ORDER — DOCUSATE SODIUM 100 MG PO CAPS
200.0000 mg | ORAL_CAPSULE | Freq: Every day | ORAL | Status: DC
  Administered 2023-04-06 – 2023-04-09 (×4): 200 mg via ORAL
  Filled 2023-04-05 (×4): qty 2

## 2023-04-05 MED ORDER — DEXMEDETOMIDINE HCL IN NACL 400 MCG/100ML IV SOLN
0.0000 ug/kg/h | INTRAVENOUS | Status: DC
  Administered 2023-04-05: 0.4 ug/kg/h via INTRAVENOUS

## 2023-04-05 MED ORDER — MORPHINE SULFATE (PF) 2 MG/ML IV SOLN
1.0000 mg | INTRAVENOUS | Status: DC | PRN
  Administered 2023-04-05 – 2023-04-06 (×3): 2 mg via INTRAVENOUS
  Filled 2023-04-05 (×3): qty 1

## 2023-04-05 MED ORDER — VANCOMYCIN HCL IN DEXTROSE 1-5 GM/200ML-% IV SOLN
1000.0000 mg | Freq: Once | INTRAVENOUS | Status: AC
  Administered 2023-04-05: 1000 mg via INTRAVENOUS
  Filled 2023-04-05: qty 200

## 2023-04-05 MED ORDER — ASPIRIN 81 MG PO CHEW: 324.0000 mg | CHEWABLE_TABLET | Freq: Every day | ORAL | Status: DC

## 2023-04-05 MED ORDER — PLASMA-LYTE A IV SOLN: INTRAVENOUS | Status: DC | PRN

## 2023-04-05 MED ORDER — INSULIN REGULAR(HUMAN) IN NACL 100-0.9 UT/100ML-% IV SOLN
INTRAVENOUS | Status: DC
  Administered 2023-04-05: 3.2 [IU]/h via INTRAVENOUS

## 2023-04-05 MED ORDER — ORAL CARE MOUTH RINSE
15.0000 mL | OROMUCOSAL | Status: DC
  Administered 2023-04-05: 15 mL via OROMUCOSAL

## 2023-04-05 MED ORDER — BISACODYL 10 MG RE SUPP: 10.0000 mg | Freq: Every day | RECTAL | Status: DC

## 2023-04-05 MED ORDER — SODIUM CHLORIDE 0.9% FLUSH
3.0000 mL | Freq: Two times a day (BID) | INTRAVENOUS | Status: DC
  Administered 2023-04-05 (×2): 10 mL via INTRAVENOUS
  Administered 2023-04-06: 3 mL via INTRAVENOUS
  Administered 2023-04-07 – 2023-04-08 (×3): 10 mL via INTRAVENOUS
  Administered 2023-04-09: 3 mL via INTRAVENOUS

## 2023-04-05 MED ORDER — PROPOFOL 10 MG/ML IV BOLUS
INTRAVENOUS | Status: DC | PRN
  Administered 2023-04-05 (×2): 40 mg via INTRAVENOUS

## 2023-04-05 MED ORDER — PROPOFOL 10 MG/ML IV BOLUS
INTRAVENOUS | Status: AC
Start: 2023-04-05 — End: ?
  Filled 2023-04-05: qty 20

## 2023-04-05 MED ORDER — TRAMADOL HCL 50 MG PO TABS
50.0000 mg | ORAL_TABLET | ORAL | Status: DC | PRN
  Administered 2023-04-05: 100 mg via ORAL
  Filled 2023-04-05: qty 2

## 2023-04-05 MED ORDER — CEFAZOLIN SODIUM-DEXTROSE 2-4 GM/100ML-% IV SOLN
2.0000 g | Freq: Three times a day (TID) | INTRAVENOUS | Status: AC
  Administered 2023-04-05 – 2023-04-07 (×6): 2 g via INTRAVENOUS
  Filled 2023-04-05 (×6): qty 100

## 2023-04-05 MED ORDER — NITROGLYCERIN IN D5W 200-5 MCG/ML-% IV SOLN: 0.0000 ug/min | INTRAVENOUS | Status: DC

## 2023-04-05 MED ORDER — ASPIRIN 81 MG PO CHEW
324.0000 mg | CHEWABLE_TABLET | Freq: Once | ORAL | Status: AC
  Administered 2023-04-05: 324 mg via ORAL
  Filled 2023-04-05: qty 4

## 2023-04-05 MED ORDER — LEVOTHYROXINE SODIUM 112 MCG PO TABS
112.0000 ug | ORAL_TABLET | Freq: Every day | ORAL | Status: DC
  Administered 2023-04-06 – 2023-04-09 (×4): 112 ug via ORAL
  Filled 2023-04-05 (×4): qty 1

## 2023-04-05 MED ORDER — TIOTROPIUM BROMIDE MONOHYDRATE 2.5 MCG/ACT IN AERS: 2.0000 | INHALATION_SPRAY | Freq: Every day | RESPIRATORY_TRACT | Status: DC

## 2023-04-05 MED ORDER — SODIUM CHLORIDE 0.9% FLUSH
10.0000 mL | Freq: Two times a day (BID) | INTRAVENOUS | Status: DC
  Administered 2023-04-05 – 2023-04-06 (×3): 10 mL
  Administered 2023-04-06: 30 mL
  Administered 2023-04-07 – 2023-04-08 (×2): 10 mL

## 2023-04-05 MED ORDER — FENTANYL CITRATE (PF) 250 MCG/5ML IJ SOLN
INTRAMUSCULAR | Status: AC
  Filled 2023-04-05: qty 5

## 2023-04-05 MED ORDER — LIDOCAINE 2% (20 MG/ML) 5 ML SYRINGE
INTRAMUSCULAR | Status: AC
Start: 2023-04-05 — End: ?
  Filled 2023-04-05: qty 5

## 2023-04-05 MED ORDER — CHLORHEXIDINE GLUCONATE CLOTH 2 % EX PADS
6.0000 | MEDICATED_PAD | Freq: Every day | CUTANEOUS | Status: DC
  Administered 2023-04-05 – 2023-04-09 (×5): 6 via TOPICAL

## 2023-04-05 MED ORDER — ALBUMIN HUMAN 5 % IV SOLN: INTRAVENOUS | Status: DC | PRN

## 2023-04-05 MED ORDER — ALBUTEROL-BUDESONIDE 90-80 MCG/ACT IN AERO: 1.0000 | INHALATION_SPRAY | Freq: Four times a day (QID) | RESPIRATORY_TRACT | Status: DC | PRN

## 2023-04-05 MED ORDER — ROSUVASTATIN CALCIUM 20 MG PO TABS
40.0000 mg | ORAL_TABLET | Freq: Every day | ORAL | Status: DC
Start: 2023-04-06 — End: 2023-04-09
  Administered 2023-04-06 – 2023-04-09 (×4): 40 mg via ORAL
  Filled 2023-04-05 (×4): qty 2

## 2023-04-05 MED ORDER — BISACODYL 5 MG PO TBEC
10.0000 mg | DELAYED_RELEASE_TABLET | Freq: Every day | ORAL | Status: DC
  Administered 2023-04-06 – 2023-04-09 (×4): 10 mg via ORAL
  Filled 2023-04-05 (×4): qty 2

## 2023-04-05 MED ORDER — LACTATED RINGERS IV SOLN: INTRAVENOUS | Status: DC | PRN

## 2023-04-05 MED ORDER — EPINEPHRINE HCL 5 MG/250ML IV SOLN IN NS: 0.0000 ug/min | INTRAVENOUS | Status: DC

## 2023-04-05 MED ORDER — ACETAMINOPHEN 160 MG/5ML PO SOLN: 1000.0000 mg | Freq: Four times a day (QID) | ORAL | Status: DC

## 2023-04-05 MED ORDER — ACETAMINOPHEN 160 MG/5ML PO SOLN
650.0000 mg | Freq: Once | ORAL | Status: AC
  Administered 2023-04-05: 650 mg
  Filled 2023-04-05: qty 20.3

## 2023-04-05 MED ORDER — MIDAZOLAM HCL 2 MG/2ML IJ SOLN: 2.0000 mg | INTRAMUSCULAR | Status: DC | PRN

## 2023-04-05 MED ORDER — PROPOFOL 10 MG/ML IV BOLUS
INTRAVENOUS | Status: AC
  Filled 2023-04-05: qty 20

## 2023-04-05 MED ORDER — HEPARIN SODIUM (PORCINE) 1000 UNIT/ML IJ SOLN
INTRAMUSCULAR | Status: AC
  Filled 2023-04-05: qty 1

## 2023-04-05 MED ORDER — PANTOPRAZOLE SODIUM 40 MG PO TBEC
40.0000 mg | DELAYED_RELEASE_TABLET | Freq: Every day | ORAL | Status: DC
  Administered 2023-04-07 – 2023-04-09 (×3): 40 mg via ORAL
  Filled 2023-04-05 (×3): qty 1

## 2023-04-05 MED ORDER — ROCURONIUM BROMIDE 10 MG/ML (PF) SYRINGE
PREFILLED_SYRINGE | INTRAVENOUS | Status: DC | PRN
  Administered 2023-04-05: 40 mg via INTRAVENOUS
  Administered 2023-04-05: 80 mg via INTRAVENOUS
  Administered 2023-04-05: 50 mg via INTRAVENOUS

## 2023-04-05 MED ORDER — NOREPINEPHRINE 4 MG/250ML-% IV SOLN
0.0000 ug/min | INTRAVENOUS | Status: DC
  Administered 2023-04-05: 2 ug/min via INTRAVENOUS

## 2023-04-05 MED ORDER — ROCURONIUM BROMIDE 10 MG/ML (PF) SYRINGE
PREFILLED_SYRINGE | INTRAVENOUS | Status: AC
  Filled 2023-04-05: qty 10

## 2023-04-05 MED ORDER — ALPRAZOLAM 0.25 MG PO TABS
0.2500 mg | ORAL_TABLET | Freq: Two times a day (BID) | ORAL | Status: DC | PRN
  Administered 2023-04-08: 0.25 mg via ORAL
  Filled 2023-04-05: qty 1

## 2023-04-05 MED ORDER — PANTOPRAZOLE SODIUM 40 MG IV SOLR
40.0000 mg | Freq: Every day | INTRAVENOUS | Status: AC
  Administered 2023-04-05 – 2023-04-06 (×2): 40 mg via INTRAVENOUS
  Filled 2023-04-05 (×2): qty 10

## 2023-04-05 MED ORDER — SODIUM CHLORIDE 0.9 % IV SOLN: INTRAVENOUS | Status: DC | PRN

## 2023-04-05 MED ORDER — 0.9 % SODIUM CHLORIDE (POUR BTL) OPTIME
TOPICAL | Status: DC | PRN
  Administered 2023-04-05: 5000 mL

## 2023-04-05 MED ORDER — ONDANSETRON HCL 4 MG/2ML IJ SOLN
INTRAMUSCULAR | Status: DC | PRN
  Administered 2023-04-05: 4 mg via INTRAVENOUS

## 2023-04-05 MED ORDER — METOPROLOL TARTRATE 5 MG/5ML IV SOLN: 2.5000 mg | INTRAVENOUS | Status: DC | PRN

## 2023-04-05 MED ORDER — NICARDIPINE HCL IN NACL 20-0.86 MG/200ML-% IV SOLN: 0.0000 mg/h | INTRAVENOUS | Status: DC

## 2023-04-05 MED ORDER — ORAL CARE MOUTH RINSE
15.0000 mL | OROMUCOSAL | Status: DC
  Administered 2023-04-05 – 2023-04-07 (×9): 15 mL via OROMUCOSAL

## 2023-04-05 MED ORDER — ~~LOC~~ CARDIAC SURGERY, PATIENT & FAMILY EDUCATION
Freq: Once | Status: DC
Start: 2023-04-05 — End: 2023-04-05
  Filled 2023-04-05: qty 1

## 2023-04-05 MED ORDER — METOPROLOL TARTRATE 5 MG/5ML IV SOLN
INTRAVENOUS | Status: DC | PRN
  Administered 2023-04-05: 1 mg via INTRAVENOUS

## 2023-04-05 MED ORDER — MIDAZOLAM HCL (PF) 5 MG/ML IJ SOLN
INTRAMUSCULAR | Status: DC | PRN
  Administered 2023-04-05: 3 mg via INTRAVENOUS
  Administered 2023-04-05: 2 mg via INTRAVENOUS

## 2023-04-05 MED ORDER — MAGNESIUM SULFATE 4 GM/100ML IV SOLN
4.0000 g | Freq: Once | INTRAVENOUS | Status: AC
  Administered 2023-04-05: 4 g via INTRAVENOUS
  Filled 2023-04-05: qty 100

## 2023-04-05 MED ORDER — SUGAMMADEX SODIUM 200 MG/2ML IV SOLN
INTRAVENOUS | Status: AC
  Filled 2023-04-05: qty 2

## 2023-04-05 MED ORDER — ALBUTEROL SULFATE (2.5 MG/3ML) 0.083% IN NEBU
2.5000 mg | INHALATION_SOLUTION | Freq: Four times a day (QID) | RESPIRATORY_TRACT | Status: DC | PRN
  Administered 2023-04-05: 2.5 mg via RESPIRATORY_TRACT
  Filled 2023-04-05: qty 3

## 2023-04-05 MED ORDER — VENLAFAXINE HCL ER 75 MG PO CP24
225.0000 mg | ORAL_CAPSULE | Freq: Every day | ORAL | Status: DC
  Administered 2023-04-06 – 2023-04-09 (×4): 225 mg via ORAL
  Filled 2023-04-05: qty 1
  Filled 2023-04-05: qty 3
  Filled 2023-04-05: qty 1
  Filled 2023-04-05: qty 3

## 2023-04-05 MED ORDER — PHENYLEPHRINE 80 MCG/ML (10ML) SYRINGE FOR IV PUSH (FOR BLOOD PRESSURE SUPPORT)
PREFILLED_SYRINGE | INTRAVENOUS | Status: AC
  Filled 2023-04-05: qty 10

## 2023-04-05 MED ORDER — METOPROLOL TARTRATE 25 MG/10 ML ORAL SUSPENSION
12.5000 mg | Freq: Two times a day (BID) | ORAL | Status: DC
  Filled 2023-04-05: qty 10

## 2023-04-05 MED ORDER — PROTAMINE SULFATE 10 MG/ML IV SOLN
INTRAVENOUS | Status: DC | PRN
  Administered 2023-04-05: 140 mg via INTRAVENOUS

## 2023-04-05 MED ORDER — NOREPINEPHRINE BITARTRATE 1 MG/ML IV SOLN
INTRAVENOUS | Status: DC | PRN
  Administered 2023-04-05 (×2): .5 mL via INTRAVENOUS
  Administered 2023-04-05 (×2): 1 mL via INTRAVENOUS
  Administered 2023-04-05: .5 mL via INTRAVENOUS
  Administered 2023-04-05: 1 mL via INTRAVENOUS
  Administered 2023-04-05 (×3): .5 mL via INTRAVENOUS
  Administered 2023-04-05: 1 mL via INTRAVENOUS

## 2023-04-05 MED ORDER — METOPROLOL TARTRATE 12.5 MG HALF TABLET
12.5000 mg | ORAL_TABLET | Freq: Two times a day (BID) | ORAL | Status: DC
  Administered 2023-04-05 – 2023-04-06 (×3): 12.5 mg via ORAL
  Filled 2023-04-05 (×3): qty 1

## 2023-04-05 MED ORDER — LIDOCAINE HCL (CARDIAC) PF 100 MG/5ML IV SOSY
PREFILLED_SYRINGE | INTRAVENOUS | Status: DC | PRN
  Administered 2023-04-05: 60 mg via INTRATRACHEAL

## 2023-04-05 MED ORDER — SODIUM CHLORIDE 0.45 % IV SOLN: INTRAVENOUS | Status: AC | PRN

## 2023-04-05 MED ORDER — FLUTICASONE FUROATE-VILANTEROL 100-25 MCG/ACT IN AEPB
1.0000 | INHALATION_SPRAY | Freq: Every day | RESPIRATORY_TRACT | Status: DC
  Administered 2023-04-06 – 2023-04-07 (×2): 1 via RESPIRATORY_TRACT
  Filled 2023-04-05 (×2): qty 28

## 2023-04-05 MED ORDER — HEPARIN SODIUM (PORCINE) 1000 UNIT/ML IJ SOLN
INTRAMUSCULAR | Status: DC | PRN
  Administered 2023-04-05: 14000 [IU] via INTRAVENOUS
  Administered 2023-04-05: 3000 [IU] via INTRAVENOUS

## 2023-04-05 MED ORDER — PHENYLEPHRINE 80 MCG/ML (10ML) SYRINGE FOR IV PUSH (FOR BLOOD PRESSURE SUPPORT)
PREFILLED_SYRINGE | INTRAVENOUS | Status: DC | PRN
  Administered 2023-04-05: 160 ug via INTRAVENOUS
  Administered 2023-04-05: 80 ug via INTRAVENOUS

## 2023-04-05 MED ORDER — METOCLOPRAMIDE HCL 5 MG/ML IJ SOLN
10.0000 mg | Freq: Four times a day (QID) | INTRAMUSCULAR | Status: AC
  Administered 2023-04-05 – 2023-04-06 (×5): 10 mg via INTRAVENOUS
  Filled 2023-04-05 (×5): qty 2

## 2023-04-05 MED ORDER — SODIUM BICARBONATE 8.4 % IV SOLN
100.0000 meq | Freq: Once | INTRAVENOUS | Status: AC
  Administered 2023-04-05: 100 meq via INTRAVENOUS

## 2023-04-05 MED ORDER — FENTANYL CITRATE (PF) 250 MCG/5ML IJ SOLN
INTRAMUSCULAR | Status: DC | PRN
  Administered 2023-04-05 (×2): 150 ug via INTRAVENOUS
  Administered 2023-04-05 (×2): 100 ug via INTRAVENOUS
  Administered 2023-04-05: 250 ug via INTRAVENOUS

## 2023-04-05 MED ORDER — UMECLIDINIUM BROMIDE 62.5 MCG/ACT IN AEPB
1.0000 | INHALATION_SPRAY | Freq: Every day | RESPIRATORY_TRACT | Status: DC
  Administered 2023-04-06 – 2023-04-07 (×2): 1 via RESPIRATORY_TRACT
  Filled 2023-04-05 (×2): qty 7

## 2023-04-05 MED ORDER — CHLORHEXIDINE GLUCONATE 4 % EX SOLN
30.0000 mL | CUTANEOUS | Status: DC
Start: 2023-04-05 — End: 2023-04-05

## 2023-04-05 MED ORDER — ACETAMINOPHEN 500 MG PO TABS
1000.0000 mg | ORAL_TABLET | Freq: Four times a day (QID) | ORAL | Status: DC
Start: 2023-04-06 — End: 2023-04-09
  Administered 2023-04-06 – 2023-04-09 (×15): 1000 mg via ORAL
  Filled 2023-04-05 (×15): qty 2

## 2023-04-05 MED ORDER — SODIUM CHLORIDE 0.9% FLUSH
3.0000 mL | Freq: Two times a day (BID) | INTRAVENOUS | Status: DC
  Administered 2023-04-05 (×2): 10 mL via INTRAVENOUS
  Administered 2023-04-06: 3 mL via INTRAVENOUS
  Administered 2023-04-07 – 2023-04-08 (×3): 10 mL via INTRAVENOUS

## 2023-04-05 MED ORDER — ORAL CARE MOUTH RINSE: 15.0000 mL | OROMUCOSAL | Status: DC | PRN

## 2023-04-05 MED ORDER — EPHEDRINE 5 MG/ML INJ
INTRAVENOUS | Status: AC
  Filled 2023-04-05: qty 5

## 2023-04-05 MED ORDER — SODIUM CHLORIDE 0.9% FLUSH
3.0000 mL | Freq: Two times a day (BID) | INTRAVENOUS | Status: DC
  Administered 2023-04-06 – 2023-04-08 (×5): 3 mL via INTRAVENOUS

## 2023-04-05 MED ORDER — LORATADINE 10 MG PO TABS
10.0000 mg | ORAL_TABLET | Freq: Every day | ORAL | Status: DC
  Administered 2023-04-06 – 2023-04-09 (×4): 10 mg via ORAL
  Filled 2023-04-05 (×4): qty 1

## 2023-04-05 MED ORDER — SODIUM CHLORIDE (PF) 0.9 % IJ SOLN
OROMUCOSAL | Status: DC | PRN
  Administered 2023-04-05 (×2): 4 mL via TOPICAL

## 2023-04-05 MED ORDER — METOPROLOL TARTRATE 12.5 MG HALF TABLET
12.5000 mg | ORAL_TABLET | Freq: Once | ORAL | Status: DC
Start: 2023-04-05 — End: 2023-04-05

## 2023-04-05 MED ORDER — CHLORHEXIDINE GLUCONATE 0.12 % MT SOLN
15.0000 mL | OROMUCOSAL | Status: AC
  Administered 2023-04-05: 15 mL via OROMUCOSAL
  Filled 2023-04-05: qty 15

## 2023-04-05 MED ORDER — POTASSIUM CHLORIDE 10 MEQ/50ML IV SOLN
10.0000 meq | INTRAVENOUS | Status: AC
  Administered 2023-04-05 (×3): 10 meq via INTRAVENOUS

## 2023-04-05 SURGICAL SUPPLY — 73 items
BAG DECANTER FOR FLEXI CONT (MISCELLANEOUS) ×4 IMPLANT
BLADE CLIPPER SURG (BLADE) ×2 IMPLANT
BLADE STERNUM SYSTEM 6 (BLADE) ×2 IMPLANT
BLADE SURG 11 STRL SS (BLADE) IMPLANT
BLOWER MISTER CAL-MED (MISCELLANEOUS) ×2 IMPLANT
BNDG ELASTIC 4INX 5YD STR LF (GAUZE/BANDAGES/DRESSINGS) IMPLANT
BNDG ELASTIC 6INX 5YD STR LF (GAUZE/BANDAGES/DRESSINGS) IMPLANT
BNDG GAUZE DERMACEA FLUFF 4 (GAUZE/BANDAGES/DRESSINGS) ×2 IMPLANT
CABLE SURGICAL S-101-97-12 (CABLE) IMPLANT
CANISTER SUCT 3000ML PPV (MISCELLANEOUS) ×2 IMPLANT
CANNULA MC2 2 STG 29/37 NON-V (CANNULA) ×2 IMPLANT
CANNULA NON VENT 20FR 12 (CANNULA) ×2 IMPLANT
CANNULA VESSEL 3MM BLUNT TIP (CANNULA) IMPLANT
CLIP TI MEDIUM 24 (CLIP) IMPLANT
CLIP TI WIDE RED SMALL 24 (CLIP) IMPLANT
DERMABOND ADVANCED .7 DNX12 (GAUZE/BANDAGES/DRESSINGS) IMPLANT
DRAIN CHANNEL 19F RND (DRAIN) IMPLANT
DRAPE SRG 135X102X78XABS (DRAPES) ×2 IMPLANT
DRAPE WARM FLUID 44X44 (DRAPES) ×2 IMPLANT
DRESSING AQUACEL AG SP 3.5X10 (GAUZE/BANDAGES/DRESSINGS) IMPLANT
DRSG AQUACEL AG SP 3.5X10 (GAUZE/BANDAGES/DRESSINGS) ×2 IMPLANT
ELECT REM PT RETURN 9FT ADLT (ELECTROSURGICAL) ×4 IMPLANT
ELECTRODE REM PT RTRN 9FT ADLT (ELECTROSURGICAL) ×4 IMPLANT
FELT TEFLON 1X6 (MISCELLANEOUS) ×4 IMPLANT
GAUZE 4X4 16PLY ~~LOC~~+RFID DBL (SPONGE) ×2 IMPLANT
GAUZE SPONGE 4X4 12PLY STRL (GAUZE/BANDAGES/DRESSINGS) ×4 IMPLANT
GAUZE SPONGE 4X4 12PLY STRL LF (GAUZE/BANDAGES/DRESSINGS) IMPLANT
GLOVE BIO SURGEON STRL SZ7 (GLOVE) ×4 IMPLANT
GLOVE BIOGEL PI IND STRL 7.5 (GLOVE) ×4 IMPLANT
GOWN STRL REUS W/ TWL LRG LVL3 (GOWN DISPOSABLE) ×8 IMPLANT
GOWN STRL REUS W/ TWL XL LVL3 (GOWN DISPOSABLE) ×4 IMPLANT
HEMOSTAT POWDER SURGIFOAM 1G (HEMOSTASIS) ×6 IMPLANT
INSERT SUTURE HOLDER (MISCELLANEOUS) ×2 IMPLANT
KIT BASIN OR (CUSTOM PROCEDURE TRAY) ×2 IMPLANT
KIT SUCTION CATH 14FR (SUCTIONS) ×6 IMPLANT
KIT TURNOVER KIT B (KITS) ×2 IMPLANT
KIT VASOVIEW HEMOPRO 2 VH 4000 (KITS) ×2 IMPLANT
LEAD PACING MYOCARDI (MISCELLANEOUS) ×2 IMPLANT
MARKER GRAFT CORONARY BYPASS (MISCELLANEOUS) ×6 IMPLANT
NS IRRIG 1000ML POUR BTL (IV SOLUTION) ×10 IMPLANT
OFFPUMP STABILIZER SUV (MISCELLANEOUS) ×2 IMPLANT
PACK E OPEN HEART (SUTURE) ×2 IMPLANT
PACK OPEN HEART (CUSTOM PROCEDURE TRAY) ×2 IMPLANT
PAD ARMBOARD 7.5X6 YLW CONV (MISCELLANEOUS) ×4 IMPLANT
PAD ELECT DEFIB RADIOL ZOLL (MISCELLANEOUS) ×2 IMPLANT
PENCIL BUTTON HOLSTER BLD 10FT (ELECTRODE) ×2 IMPLANT
POSITIONER ACROBAT-I OFFPUMP (MISCELLANEOUS) ×2 IMPLANT
POSITIONER HEAD DONUT 9IN (MISCELLANEOUS) ×2 IMPLANT
SHUNT CORONARY AXIUS 1.0 (MISCELLANEOUS) IMPLANT
SHUNT FLO COIL 1.50MM (MISCELLANEOUS) IMPLANT
SPONGE T-LAP 18X18 ~~LOC~~+RFID (SPONGE) ×8 IMPLANT
STOPCOCK 4 WAY LG BORE MALE ST (IV SETS) IMPLANT
SUPPORT HEART JANKE-BARRON (MISCELLANEOUS) ×2 IMPLANT
SUT MNCRL AB 3-0 PS2 18 (SUTURE) ×4 IMPLANT
SUT MNCRL AB 4-0 PS2 18 (SUTURE) IMPLANT
SUT PDS AB 1 CTX 36 (SUTURE) ×4 IMPLANT
SUT PROLENE 4-0 RB1 .5 CRCL 36 (SUTURE) IMPLANT
SUT PROLENE 5 0 C 1 36 (SUTURE) ×6 IMPLANT
SUT PROLENE 7 0 BV 1 (SUTURE) IMPLANT
SUT PROLENE BLUE 7 0 (SUTURE) ×2 IMPLANT
SUT STEEL 6MS V (SUTURE) ×4 IMPLANT
SUT VIC AB 1 CTX36XBRD ANBCTR (SUTURE) IMPLANT
SUT VIC AB 2-0 CT1 TAPERPNT 27 (SUTURE) IMPLANT
SYSTEM SAHARA CHEST DRAIN ATS (WOUND CARE) ×2 IMPLANT
TAPE CLOTH SURG 4X10 WHT LF (GAUZE/BANDAGES/DRESSINGS) IMPLANT
TAPE PAPER 2X10 WHT MICROPORE (GAUZE/BANDAGES/DRESSINGS) IMPLANT
TAPES RETRACTO (MISCELLANEOUS) ×4 IMPLANT
TOWEL GREEN STERILE (TOWEL DISPOSABLE) ×2 IMPLANT
TOWEL GREEN STERILE FF (TOWEL DISPOSABLE) ×2 IMPLANT
TRAY FOLEY SLVR 16FR TEMP STAT (SET/KITS/TRAYS/PACK) ×2 IMPLANT
TUBING LAP HI FLOW INSUFFLATIO (TUBING) ×2 IMPLANT
UNDERPAD 30X36 HEAVY ABSORB (UNDERPADS AND DIAPERS) ×2 IMPLANT
WATER STERILE IRR 1000ML POUR (IV SOLUTION) ×4 IMPLANT

## 2023-04-05 NOTE — Progress Notes (Signed)
 Echocardiogram Echocardiogram Transesophageal has been performed.  Lucendia Herrlich 04/05/2023, 5:54 PM

## 2023-04-05 NOTE — Interval H&P Note (Signed)
 History and Physical Interval Note:  04/05/2023 8:32 AM  [name]  has presented today for surgery, with the diagnosis of CAD.  The various methods of treatment have been discussed with the patient and family. After consideration of risks, benefits and other options for treatment, the patient has consented to  Procedure(s): OFF PUMP CORONARY ARTERY BYPASS GRAFTING (CABG) (N/A) TRANSESOPHAGEAL ECHOCARDIOGRAM (TEE) (N/A) as a surgical intervention.  The patient's history has been reviewed, patient examined, no change in status, stable for surgery.  I have reviewed the patient's chart and labs.  Questions were answered to the patient's satisfaction.     Jazzelle Zhang Keane Scrape

## 2023-04-05 NOTE — Anesthesia Procedure Notes (Signed)
 Procedure Name: Intubation Date/Time: 04/05/2023 8:56 AM  Performed by: April Holding, CRNAPre-anesthesia Checklist: Patient identified, Emergency Drugs available, Suction available and Patient being monitored Patient Re-evaluated:Patient Re-evaluated prior to induction Oxygen Delivery Method: Circle System Utilized Preoxygenation: Pre-oxygenation with 100% oxygen Induction Type: IV induction Ventilation: Mask ventilation without difficulty Laryngoscope Size: 2 Tube type: Oral Tube size: 8.0 mm Number of attempts: 1 Airway Equipment and Method: Stylet Placement Confirmation: ETT inserted through vocal cords under direct vision, positive ETCO2 and breath sounds checked- equal and bilateral Secured at: 22 cm Tube secured with: Tape Dental Injury: Teeth and Oropharynx as per pre-operative assessment

## 2023-04-05 NOTE — Brief Op Note (Signed)
 04/05/2023  11:56 AM  PATIENT:  [name]  63 y.o. female  PRE-OPERATIVE DIAGNOSIS:  Coronary Artery Disease  POST-OPERATIVE DIAGNOSIS:  Coronary Artery Disease  PROCEDURE:  TRANSESOPHAGEAL ECHOCARDIOGRAM (TEE), OFF PUMP CORONARY ARTERY BYPASS GRAFTING (CABG) TIMES THREE  USING LEFT INTERNAL MAMMARY ARTERY  AND ENDOSCOPICALLY HARVESTED RIGHT GREATER SAPHENOUS VEIN   Vein harvest time: 27 min Vein prep time: 14 min  SURGEON:  Surgeons and Role:    Corliss Skains, MD - Primary  PHYSICIAN ASSISTANT: Doree Fudge PA-C  ASSISTANTS: Dorthey Sawyer RNFA   ANESTHESIA:   general  EBL:  Per perfusion, anesthesia record  DRAINS:  Chest tubes placed in the mediastinal and pleural spaces    COUNTS:  YES  DICTATION: .Dragon Dictation  PLAN OF CARE: Admit to inpatient   PATIENT DISPOSITION:  ICU - intubated and hemodynamically stable.   Delay start of Pharmacological VTE agent (>24hrs) due to surgical blood loss or risk of bleeding: no  BASELINE WEIGHT: 87.5 kg

## 2023-04-05 NOTE — Anesthesia Preprocedure Evaluation (Signed)
 Anesthesia Evaluation  Patient identified by MRN, date of birth, ID band Patient awake    Reviewed: Allergy & Precautions, NPO status , Patient's Chart, lab work & pertinent test results  Airway Mallampati: II  TM Distance: >3 FB Neck ROM: Full    Dental  (+) Edentulous Upper, Edentulous Lower, Dental Advisory Given   Pulmonary neg shortness of breath, asthma , neg sleep apnea, neg COPD, neg recent URI, former smoker   breath sounds clear to auscultation       Cardiovascular hypertension, Pt. on medications and Pt. on home beta blockers + CAD and + Peripheral Vascular Disease   Rhythm:Regular     Neuro/Psych  Headaches PSYCHIATRIC DISORDERS Anxiety Depression     Neuromuscular disease    GI/Hepatic ,GERD  ,,(+) Hepatitis -  Endo/Other  diabetesHypothyroidism  Lab Results      Component                Value               Date                      HGBA1C                   6.1 (H)             04/03/2023             Renal/GU negative Renal ROSLab Results      Component                Value               Date                      NA                       138                 04/05/2023                K                        3.9                 04/05/2023                CO2                      24                  04/03/2023                GLUCOSE                  176 (H)             04/05/2023                BUN                      14                  04/05/2023                CREATININE               0.70  04/05/2023                CALCIUM                  9.2                 04/03/2023                GFR                      74.37               03/25/2013                EGFR                     81                  02/27/2023                GFRNONAA                 >60                 04/03/2023                Musculoskeletal   Abdominal   Peds  Hematology Lab Results      Component                Value                Date                      WBC                      11.1 (H)            04/03/2023                HGB                      11.9 (L)            04/05/2023                HCT                      35.0 (L)            04/05/2023                MCV                      90.2                04/03/2023                PLT                      161                 04/03/2023              Anesthesia Other Findings   Reproductive/Obstetrics                             Anesthesia Physical Anesthesia Plan  ASA: 4  Anesthesia Plan: General  Post-op Pain Management:    Induction: Intravenous  PONV Risk Score and Plan: 3 and Ondansetron and Treatment may vary due to age or medical condition  Airway Management Planned: Oral ETT  Additional Equipment: Arterial line, CVP, Ultrasound Guidance Line Placement and TEE  Intra-op Plan:   Post-operative Plan: Post-operative intubation/ventilation  Informed Consent: I have reviewed the patients History and Physical, chart, labs and discussed the procedure including the risks, benefits and alternatives for the proposed anesthesia with the patient or authorized representative who has indicated his/her understanding and acceptance.     Dental advisory given  Plan Discussed with: CRNA  Anesthesia Plan Comments:        Anesthesia Quick Evaluation

## 2023-04-05 NOTE — Anesthesia Procedure Notes (Signed)
 Central Venous Catheter Insertion Performed by: Val Eagle, MD, anesthesiologist Start/End2/19/2025 9:01 AM, 04/05/2023 9:11 AM Patient location: OR. Preanesthetic checklist: patient identified, IV checked, site marked, risks and benefits discussed, surgical consent, monitors and equipment checked, pre-op evaluation, timeout performed and anesthesia consent Position: supine Hand hygiene performed  and maximum sterile barriers used  Catheter size: 8 Fr Total catheter length 16. Central line was placed.Double lumen Procedure performed using ultrasound guided technique. Ultrasound Notes:anatomy identified, needle tip was noted to be adjacent to the nerve/plexus identified, no ultrasound evidence of intravascular and/or intraneural injection and image(s) printed for medical record Attempts: 1 Following insertion, dressing applied and line sutured. Post procedure assessment: blood return through all ports, free fluid flow and no air  Patient tolerated the procedure well with no immediate complications.

## 2023-04-05 NOTE — Procedures (Signed)
 Extubation Procedure Note  Patient Details:   Name: [name] DOB: 16-Sep-1960 MRN: [id number]   Airway Documentation:    Vent end date: 04/05/23 Vent end time: 1615   Evaluation  O2 sats: stable throughout Complications: No apparent complications Patient did tolerate procedure well. Bilateral Breath Sounds: Clear, Diminished   Yes  NIF -35 VC 823 Pos cuff leak was noted and pt was able to speak post procedure.  Idelle Leech 04/05/2023, 4:23 PM

## 2023-04-05 NOTE — Transfer of Care (Signed)
 Immediate Anesthesia Transfer of Care Note  Patient: [name]  Procedure(s) Performed: OFF PUMP CORONARY ARTERY BYPASS GRAFTING (CABG) TIMES THREE  USING LEFT INTERNAL MAMMARY ARTERY  AND ENDOSCOPICALLY HARVESTED RIGHT GREATER SAPHENOUS VEIN (Chest) TRANSESOPHAGEAL ECHOCARDIOGRAM (TEE)  Patient Location: PACU and SICU  Anesthesia Type:General  Level of Consciousness: sedated and Patient remains intubated per anesthesia plan  Airway & Oxygen Therapy: Patient remains intubated per anesthesia plan and Patient placed on Ventilator (see vital sign flow sheet for setting)  Post-op Assessment: Report given to RN and Post -op Vital signs reviewed and stable  Post vital signs: Reviewed and stable  Last Vitals:  Vitals Value Taken Time  BP 115/68   Temp    Pulse 67   Resp    SpO2 100     Last Pain:  Vitals:   04/05/23 0800  TempSrc: Oral  PainSc: 0-No pain      Patients Stated Pain Goal: 0 (04/05/23 0800)  Complications: No notable events documented.

## 2023-04-05 NOTE — Discharge Instructions (Signed)

## 2023-04-05 NOTE — Anesthesia Procedure Notes (Signed)
 Arterial Line Insertion Start/End2/19/2025 8:15 AM, 04/05/2023 8:19 AM Performed by: Val Eagle, MD, Dye, Margit Banda, CRNA, CRNA  Patient location: Pre-op. Preanesthetic checklist: patient identified, IV checked, site marked, risks and benefits discussed, surgical consent, monitors and equipment checked, pre-op evaluation, timeout performed and anesthesia consent Lidocaine 1% used for infiltration radial was placed Catheter size: 20 G Hand hygiene performed  and maximum sterile barriers used   Attempts: 1 Procedure performed using ultrasound guided technique. Ultrasound Notes:anatomy identified, needle tip was noted to be adjacent to the nerve/plexus identified and no ultrasound evidence of intravascular and/or intraneural injection Following insertion, dressing applied. Post procedure assessment: normal and unchanged

## 2023-04-05 NOTE — Discharge Summary (Signed)
 [address]       [place] [address]             [phone]    Physician Discharge Summary  Patient ID: [name] MRN: [id number] DOB/AGE: [birth year] 63 y.o.  Admit date: 04/05/2023 Discharge date: 04/09/2023  Admission Diagnoses:  Patient Active Problem List   Diagnosis Date Noted   On mechanically assisted ventilation (HCC) 04/05/2023   Coronary artery disease 03/15/2023   Carotid artery disease (HCC) 02/27/2023   Acute recurrent pansinusitis 02/13/2023   Chest pain on exertion 02/13/2023   Pain 02/13/2023   Family history of dementia 09/13/2022   Chondromalacia of right patella 12/17/2021   Osteoarthritis of right acromioclavicular joint 03/09/2021   Subacromial impingement of right shoulder 12/31/2020   Moderate persistent asthma without complication 11/27/2020   Reflux gastritis 11/05/2020   Rosacea 08/04/2020   Situational anxiety 05/11/2020   Body mass index (BMI) of 36.0-36.9 in adult 05/11/2020   DDD (degenerative disc disease), cervical 08/30/2019   Nonalcoholic steatohepatitis (NASH) 08/08/2019   Migraine with aura and without status migrainosus, not intractable 05/31/2017   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   Diabetes 1.5, 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   Type 2 diabetes mellitus with complications (HCC) 01/14/2015   Vitamin D deficiency 01/14/2015   Peripheral vascular disease (HCC) 07/15/2011   Hypothyroidism 08/04/2006   Hyperlipidemia associated with type 2 diabetes mellitus (HCC) 08/04/2006   Hypertension complicating diabetes (HCC) 08/04/2006     Discharge Diagnoses:  Patient Active Problem List   Diagnosis Date Noted   On mechanically assisted ventilation (HCC) 04/05/2023   Coronary artery disease 03/15/2023   Carotid artery disease (HCC) 02/27/2023   Acute recurrent pansinusitis 02/13/2023   Chest pain on exertion 02/13/2023   Pain 02/13/2023   Family history of dementia 09/13/2022   Chondromalacia  of right patella 12/17/2021   Osteoarthritis of right acromioclavicular joint 03/09/2021   Subacromial impingement of right shoulder 12/31/2020   Moderate persistent asthma without complication 11/27/2020   Reflux gastritis 11/05/2020   Rosacea 08/04/2020   Situational anxiety 05/11/2020   Body mass index (BMI) of 36.0-36.9 in adult 05/11/2020   DDD (degenerative disc disease), cervical 08/30/2019   Nonalcoholic steatohepatitis (NASH) 08/08/2019   Migraine with aura and without status migrainosus, not intractable 05/31/2017   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   Diabetes 1.5, 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   Type 2 diabetes mellitus with complications (HCC) 01/14/2015   Vitamin D deficiency 01/14/2015   Peripheral vascular disease (HCC) 07/15/2011   Hypothyroidism 08/04/2006   Hyperlipidemia associated with type 2 diabetes mellitus (HCC) 08/04/2006   Hypertension complicating diabetes (HCC) 08/04/2006     Discharged Condition: Stable  HPI: This is a 63 year old female with a past medical history of hypertension, hyperlipidemia, diabetes mellitus, and remote tobacco abuse who was found to have multivessel coronary artery disease. Per medical record, she developed chest pain approximately 6 months ago occurring several times a month characterized as pain in her chest rating to her jaw and left upper extremity.Because of fairly classic symptoms of chest pain, the cardiologist recommended outpatient diagnostic catheterization which was performed 03/06/2023 by Dr. Okey Dupre.  Results of the catheterization revealed three-vessel coronary artery disease  Dr.  Cliffton Asters evaluated the patient in the office on 03/24/2023. He discussed the need for coronary artery bypass grafting surgery. Potential risks, benefits, and complications of the surgery were discussed with the patient and she agreed  to proceed with surgery. Pre operative carotid duplex US showed a 40-59% right internal carotid artery  stenosis and no significant left internal carotid artery stenosis.   Hospital Course: Patient underwent  off pump CABG x 3 (LIMA to LAD, SVG to Diagonal, and SVG to PDA).  She was transported from the OR to [facility] ICU in stable condition. She was extubated without difficulty the evening of surgery.  She was weaned off insulin drip and transitioned to [facility] and long acting regimen.  She underwent removal of lines, pacing wires, and chest tubes without difficulty. She was started on Lopressor and this was titrated accordingly. She was above pre op weight with LE edema so she was given Lasix. She was on 2L oxygen via [place] and was later weaned to room air. She was stable for transfer from the ICU to 4E for further convalescence on 02/21. She has been tolerating a diet and has had a bowel movement. All wounds are clean, dry, healing without signs of infection. She continues to maintain sinus rhythm. She is stable for discharge.  Consults: pulmonary/intensive care  Significant Diagnostic Studies:   Narrative & Impression  CLINICAL DATA:  Status post CABG surgery.  Follow-up exam.   EXAM: PORTABLE CHEST 1 VIEW   COMPARISON:  04/06/2023 and 04/05/2023.   FINDINGS: Cardiac silhouette normal in size.  No mediastinal widening.   Stable right internal jugular central venous line. Stable chest tubes projecting the left lung base and along the left heart border.   Lung volumes remain low. Persistent left greater than right lung base opacities, suspected to be a combination of atelectasis and small effusions. Remainder of the lungs is clear.   No pneumothorax.   IMPRESSION: 1. No significant change from the previous day's study. 2. Persistent left greater than right lung base opacities consistent with atelectasis and small effusions. No pneumothorax.     Electronically Signed   By: Amie Portland M.D.   On: 04/07/2023 09:49      Treatments: surgery:  Off pump CABG X 3.  LIMA LAD, RSVG PDA,  Diagonal   Endoscopic greater saphenous vein harvest on the right by Dr. Cliffton Asters on 04/05/2023.  Discharge Exam: Blood pressure 136/84, pulse 99, temperature 97.9 F (36.6 C), temperature source Oral, resp. rate 20, height 5\' 4"  (1.626 m), weight 88.8 kg, SpO2 98%. General appearance: alert, cooperative, and no distress Neurologic: intact Heart: RRR, no arrhythmias on monitor reviewed Lungs: breath sounds are clear, normal work of breathing. Abdomen: Soft, non tender, bowel sounds present Extremities: No edema Wound: Sternotomy incision is intact and dry. RLE wound is clean and dry.   Discharge Medications:  The patient has been discharged on:   1.Beta Blocker:  Yes [  x ]                              No   [   ]                              If No, reason:  2.Ace Inhibitor/ARB: Yes [   ]                                     No  [ x ]  If No, reason: Titrating beta-blocker.  May add in the outpatient setting  3.Statin:   Yes [  x ]                  No  [   ]                  If No, reason:  4.Ecasa:  Yes  [  x ]                  No   [   ]                  If No, reason:  Patient had ACS upon admission:  Plavix/P2Y12 inhibitor: Yes [   ]                                      No  [ x  ]      Allergies as of 04/09/2023       Reactions   Influenza Virus Vacc Split Pf Swelling, Other (See Comments)   Swelling around injection site   Gluten Meal Diarrhea   Lipitor [atorvastatin] Other (See Comments)   Dizziness/headache        Medication List     STOP taking these medications    Azelaic Acid 15 % gel   benazepril 10 MG tablet Commonly known as: LOTENSIN   celecoxib 200 MG capsule Commonly known as: CeleBREX   HYDROcodone bit-homatropine 5-1.5 MG/5ML syrup Commonly known as: HYCODAN   ibuprofen 200 MG tablet Commonly known as: ADVIL   isosorbide mononitrate 30 MG 24 hr tablet Commonly known as: IMDUR   predniSONE  10 MG (21) Tbpk tablet Commonly known as: STERAPRED UNI-PAK 21 TAB       TAKE these medications    acetaminophen 325 MG tablet Commonly known as: TYLENOL Take 2 tablets (650 mg total) by mouth every 6 (six) hours as needed.   Airsupra 90-80 MCG/ACT Aero Generic drug: Albuterol-Budesonide Inhale 1-2 Inhalations into the lungs every 6 (six) hours as needed for wheezing, cough, shortness of breath.   ALPRAZolam 0.5 MG tablet Commonly known as: XANAX Take 0.5-1 tablets (0.25-0.5 mg total) by mouth 2 (two) times daily as needed for anxiety.   aspirin EC 325 MG tablet Take 1 tablet (325 mg total) by mouth daily. Start taking on: April 10, 2023 What changed:  medication strength how much to take additional instructions   Breo Ellipta 100-25 MCG/ACT Aepb Generic drug: fluticasone furoate-vilanterol Inhale 1 puff into the lungs daily.   cetirizine 10 MG tablet Commonly known as: ZYRTEC Take 1 tablet (10 mg total) by mouth daily.   Dexcom G7 Receiver Devi For use with Dexcom G7 sensor   Dexcom G7 Sensor Misc Apply new sensor every 10 days for continuous glucose monitoring.   fluticasone 50 MCG/ACT nasal spray Commonly known as: FLONASE Place 2 sprays into both nostrils 2 (two) times daily. What changed:  when to take this reasons to take this   furosemide 40 MG tablet Commonly known as: LASIX Take 1 tablet (40 mg total) by mouth daily for 5 days. Start taking on: April 10, 2023   guaiFENesin 600 MG 12 hr tablet Commonly known as: MUCINEX Take 1 tablet (600 mg total) by mouth 2 (two) times daily.   ICY HOT EX Apply 1 application topically daily as needed (pain).  levothyroxine 112 MCG tablet Commonly known as: SYNTHROID Take 1 tablet (112 mcg total) by mouth daily.   metoprolol succinate 100 MG 24 hr tablet Commonly known as: TOPROL-XL Take 1 tablet (100 mg total) by mouth daily. Take with or immediately following a meal.   Mounjaro 12.5 MG/0.5ML  Pen Generic drug: tirzepatide Inject 12.5 mg into the skin once a week. What changed: when to take this   nitroGLYCERIN 0.4 MG SL tablet Commonly known as: NITROSTAT Place 1 tablet (0.4 mg total) under the tongue every 5 (five) minutes as needed for chest pain. Max three doses in a row. Proceed to ED for evaluation if no improvement.   oxyCODONE 5 MG immediate release tablet Commonly known as: Oxy IR/ROXICODONE Take 1 tablet (5 mg total) by mouth every 3 (three) hours as needed for up to 7 days for severe pain (pain score 7-10).   pantoprazole 40 MG tablet Commonly known as: PROTONIX Take 1 tablet (40 mg total) by mouth daily.   potassium chloride SA 20 MEQ tablet Commonly known as: KLOR-CON M Take 1 tablet (20 mEq total) by mouth daily for 5 days. Start taking on: April 10, 2023   PROBIOTIC PO Take 1 capsule by mouth in the morning.   rosuvastatin 40 MG tablet Commonly known as: CRESTOR Take 1 tablet (40 mg total) by mouth daily.   Spiriva Respimat 2.5 MCG/ACT Aers Generic drug: Tiotropium Bromide Monohydrate Inhale 2 puffs into the lungs daily.   venlafaxine XR 75 MG 24 hr capsule Commonly known as: EFFEXOR-XR Take 3 capsules (225 mg total) by mouth daily with breakfast.   VITAMIN B COMPLEX PO Take 1 tablet by mouth daily.   Vitamin D (Ergocalciferol) 1.25 MG (50000 UNIT) Caps capsule Commonly known as: DRISDOL Take 1 capsule (50,000 Units total) by mouth every 7 (seven) days. What changed: when to take this         Follow-up Information     Runell Gess, MD. Go on 06/13/2023.   Specialties: Cardiology, Radiology Why: Your appointment is at 1:30 PM Contact information: [address] [place] [address] [phone]         Corliss Skains, MD Follow up.   Specialty: Cardiothoracic Surgery Why: Appointment is VIRTUAL;please do NOT go to the office. Dr. Cliffton Asters will call you on 02/28 at around 3:50 PM. Contact  information: [address] [place] [address] [phone]         Marcelino Duster, [place]. Go on 04/25/2023.   Specialties: Cardiology, Radiology Why: Appointment time is at 2:20 pm Contact information: [address] [place] [address] [phone]                 Signed:  Leary Roca, PA-C 04/09/2023, 9:12 AM

## 2023-04-05 NOTE — Plan of Care (Signed)
  Problem: Education: Goal: Knowledge of General Education information will improve Description: Including pain rating scale, medication(s)/side effects and non-pharmacologic comfort measures Outcome: Progressing   Problem: Clinical Measurements: Goal: Respiratory complications will improve Outcome: Progressing Goal: Cardiovascular complication will be avoided Outcome: Progressing   Problem: Coping: Goal: Level of anxiety will decrease Outcome: Progressing   Problem: Elimination: Goal: Will not experience complications related to urinary retention Outcome: Progressing   Problem: Pain Managment: Goal: General experience of comfort will improve and/or be controlled Outcome: Progressing   Problem: Safety: Goal: Ability to remain free from injury will improve Outcome: Progressing

## 2023-04-05 NOTE — Op Note (Signed)
 [address]       [place] [address]             [phone]                                         04/05/2023    Patient:  [name] Pre-Op Dx: 3V CAD HTN HLP Obesity   DM Post-op Dx:  Same Procedure: Off pump CABG X 3.  LIMA LAD, RSVG PDA, Diagonal   Endoscopic greater saphenous vein harvest on the right   Surgeon and Role:      * Erric Machnik, Eliezer Lofts, MD - Primary    * Jacques Earthly - PA-C assisting  An experienced assistant was required given the complexity of this surgery and the standard of surgical care. The assistant was needed for exposure, dissection, suctioning, retraction of delicate tissues and sutures, instrument exchange and for overall help during this procedure.     Anesthesia  general EBL:  Blood Administration: none  Drains: 19 F blake drain: L, mediastinal  Wires: V Counts: correct   Indications: 63yo female with 3V CAD.  Echocardiogram shows preserved biventricular function and no significant valvular disease.  On review of her left heart catherization, she does not have any good lateral wall targets.  The PAD, LAD, and diagonals appear suitable.  The risks and benefits of off pump CABG were discussed in detail.  The patient is agreeable to proceed.  Findings: Good vein and LIMA.  Small LAD, good PDA and diagonal  Operative Technique: After the risks, benefits and alternatives were thoroughly discussed, the patient was brought to the operative theatre.  All invasive lines were placed in pre-op holding.  Anesthesia was induced, and the patient was prepped and draped in normal sterile fashion.  An appropriate surgical pause was performed, and pre-operative antibiotics were dosed accordingly.  We began with simultaneous incisions along the right leg for harvesting of the greater saphenous vein and the chest for the sternotomy.  In regards to the sternotomy, this was carried down with bovie cautery, and the sternum was  divided with a reciprocating saw.  Meticulous hemostasis was obtained.  The left internal thoracic artery was exposed and harvested in in pedicled fashion.  The patient was systemically heparinized, and the artery was divided distally, and placed in a papaverine sponge.    The sternal elevator was removed, and a retractor was placed.  The pericardium was divided in the midline and fashioned into a cradle with pericardial stitches.   We exposed the anterior wall of the heart and identified a good target on the diagonal branch.  The heart was stabilized, and an arteriotomy was created.  The vein was anastomosed using 7'0 proline in an end to side fashion.  Next, we exposed a good target on the  LAD, and fashioned an end to side anastomosis between it and the LITA.  We next exposed the posterior wall and identified a good target on PDA.  An end to side anastomosis between it and reverse saphenous vein.  Meticulous hemostasis was obtained.    A partial occludding clamp was then placed on the ascending aorta, and we created an end to side anastomosis between it and the proximal vein grafts.  The proximal sites were marked with rings.  Hemostasis was obtained, and the heparin was reversed with protamine.  Chest  tubes and wires were placed, and the sternum was re-approximated with with sternal wires.  The soft tissue and skin were re-approximated wth absorbable suture.    The patient tolerated the procedure without any immediate complications, and was transferred to the ICU in guarded condition.  [name]

## 2023-04-05 NOTE — Consult Note (Signed)
 NAME:  [name], MRN:  [id number], DOB:  12/25/1960, LOS: 0 ADMISSION DATE:  04/05/2023, CONSULTATION DATE: 04/05/23 REFERRING MD:  MD Lightfoot CHIEF COMPLAINT:  Coronary Artery Disease   History of Present Illness:  Patient is a 63 yr old female hx of CAD, PVD, DM, GERD, Fatty liver disease, GERD, HLD, HTN, and hypothyroidism who presented for elective off pump CABG on 04/05/2023 by MD Lightfoot. Off pump CABG x 3 was completed- left internal mammary artery for LAD, reverse saphenous graft for posterior descending artery, and diagonal with right greater saphenous vein harvested. PCCM was consulted for post op vent and further medical management.   Pertinent  Medical History   Past Medical History:  Diagnosis Date   Abdominal bloating 11/05/2020   Abnormality of right breast on screening mammogram 10/14/2019   Needs further views of right breast.  Breast center to call.       Achilles tendon contracture, right    Acute bilateral low back pain without sciatica 08/04/2006   Qualifier: Diagnosis of   By: Marcelyn Ditty, RN, Katy Fitch        Acute non-recurrent frontal sinusitis 12/24/2019   Acute recurrent pansinusitis 08/04/2020   ALLERGIC RHINITIS 04/10/2008   Allergic rhinitis 04/10/2008   Qualifier: Diagnosis of  By: Amador Cunas  MD, Janett Labella    Anxiety    Arthritis    Asthma    asthmatic bronchitis   Breast lump in female    Carpal tunnel syndrome of right wrist    Cellulitis 10/31/2019   Chronic right shoulder pain 05/07/2019   Complication of anesthesia    trouble waking up after tubal ligation   Coronary artery disease    DEPRESSION 08/13/2008   Diabetes mellitus without complication (HCC)    Fatigue 11/17/2015   Fatty liver disease, nonalcoholic 03/29/2013   GERD (gastroesophageal reflux disease)    Headache    Occasional Migraines   HYPERLIPIDEMIA 08/04/2006   HYPERTENSION 08/04/2006   HYPOTHYROIDISM 08/04/2006   Insomnia 10/31/2019   Kidney injury    LOW BACK PAIN  08/04/2006   Moderate persistent asthma with (acute) exacerbation 06/27/2019   Neuromuscular disorder (HCC)    Pain of finger of right hand 02/09/2021   Patellofemoral syndrome, right 06/19/2019   PERIMENOPAUSAL SYNDROME 05/08/2008   Plantar fasciitis of right foot 04/12/2017   Plantar fasciitis, left 11/17/2015   Pneumonia    Postmenopausal bleeding 07/17/2019   Biopsy and [place] c/w endometrial polyp.  Pt to see Dr. Earlene Plater for surgical consult.     Primary insomnia 10/31/2019   PVD (peripheral vascular disease) (HCC)    occlusive, status post bifem bypass 2007   SOB (shortness of breath) 07/12/2016   Vaginal candidiasis 10/31/2019     Significant Hospital Events: Including procedures, antibiotic start and stop dates in addition to other pertinent events   Admit for elective CABG x 3- LIMA LAD, RSVG PDA, Diagonal, PCCM consult for post-op vent management.  Interim History / Subjective:  Intubated, sedated   Objective   Blood pressure (!) 86/58, pulse 63, temperature (!) 95 F (35 C), resp. rate 16, height 5\' 4"  (1.626 m), weight 87.5 kg, SpO2 99%. CVP:  [8 mmHg-12 mmHg] 11 mmHg CO:  [3.9 L/min-4 L/min] 3.9 L/min CI:  [2 L/min/m2-2.1 L/min/m2] 2 L/min/m2  Vent Mode: SIMV;PRVC;PSV FiO2 (%):  [50 %] 50 % Set Rate:  [16 bmp] 16 bmp Vt Set:  [420 mL-440 mL] 440 mL PEEP:  [5 cmH20] 5 cmH20 Pressure Support:  [10  cmH20] 10 cmH20 Plateau Pressure:  [12 cmH20] 12 cmH20   Intake/Output Summary (Last 24 hours) at 04/05/2023 1352 Last data filed at 04/05/2023 1318 Gross per 24 hour  Intake 3280 ml  Output 300 ml  Net 2980 ml   Filed Weights   04/05/23 0800  Weight: 87.5 kg    Examination: General: acutely ill older adult female, lying on ICU bed, vent, no distress HENT: Normocephalic, ETT, OG, Poor dentition, pink moist MM  Lungs: clear, diminished throughout, on SIMV Cardiovascular: s1,s2, v-paced, no MRG, no JVD, pleural/mediastinual chest tubes, sternal dressing Abdomen: BS  hypoactive, soft  Extremities: no acute deformities, right lower extremity with ace wrap  Neuro: RASS -3, pupils pinpoint-sluggish GU: Foley, intact  Resolved Hospital Problem list   N/a   Assessment & Plan:  CAD hx with triple vessel disease - s/p elective CABG x 3 -LIMA-LAD, RSVG-PDA, Diagonal-right great saphenous vein  Hx HTN, HLD P: Postoperative management per TCTS Postoperative care per TCTS CT management per protocol Wean pressors for map goal >65, on levophed  Postoperative ancef/vanc for surgical ppx Continue ASA and statin Wean insulin gtt per protocol Continue beta-blocker Ensure adequate pain control, continue tramadol, and oxycodone  Post op vent management Asthma hx Currently on SIMV P: Rapid wean protocol per TCTS Continue ventilator support with lung protective strategies  Wean PEEP and FiO2 for sats greater than 90%. Head of bed elevated 30 degrees. Plateau pressures less than 30 cm H20.  Follow intermittent chest x-ray and ABG.   Ensure adequate pulmonary hygiene  Follow cultures  VAP bundle in place  PAD protocol-on precedex  Continue Ancef/vanc  Continue Breo Ellipta and Incruse Inhaler once extubated PRN albuterol nebs q 6hrs   Leukocytosis in setting of s/p CABG  Suspecting inflammatory reactive response due to surgery On vanc/ancef P: Continue antibiotics  Trend CBC- WBC daily Trend fever curve   DM2  P:  Continue CBG control 110-140 On insulin gtt on Endo tool  Once off vent will change to sliding scale   HTN P: Hold antihypertensives, on levophed MAP Goal >65  Non-Alcoholic Fatty Liver disease P: Supportive care  Intermittently follow LFTS   GERD P: Continue PPI   HLD P: Continue Statin   Hypothyroidism  P: Restart synthroid when appropriate   Depression  P: Restart effexor when appropriate    Best Practice (right click and "Reselect all SmartList Selections" daily)   Diet/type: NPO DVT prophylaxis not  indicated Pressure ulcer(s): N/A GI prophylaxis: PPI Lines: Central line and yes and it is still needed Foley:  Yes, and it is still needed and Yes, and it is no longer needed Code Status:  full code Last date of multidisciplinary goals of care discussion-per primary   Labs   CBC: Recent Labs  Lab 04/03/23 1130 04/05/23 0901 04/05/23 1040 04/05/23 1220 04/05/23 1320  WBC 11.1*  --   --   --  22.3*  HGB 15.0 12.9 11.9* 10.2* 11.6*  HCT 44.3 38.0 35.0* 30.0* 33.7*  MCV 90.2  --   --   --  89.4  PLT 161  --   --   --  119*    Basic Metabolic Panel: Recent Labs  Lab 04/03/23 1130 04/05/23 0901 04/05/23 1040 04/05/23 1220  NA 138 139 138 140  K 4.3 3.7 3.9 3.5  CL 105 104 105 106  CO2 24  --   --   --   GLUCOSE 169* 178* 176* 132*  BUN 16 15  14 12  CREATININE 0.88 0.70 0.70 0.60  CALCIUM 9.2  --   --   --    GFR: Estimated Creatinine Clearance: 78 mL/min (by C-G formula based on SCr of 0.6 mg/dL). Recent Labs  Lab 04/03/23 1130 04/05/23 1320  WBC 11.1* 22.3*    Liver Function Tests: Recent Labs  Lab 04/03/23 1130  AST 29  ALT 40  ALKPHOS 75  BILITOT 0.7  PROT 6.9  ALBUMIN 3.6   No results for input(s): "LIPASE", "AMYLASE" in the last 168 hours. No results for input(s): "AMMONIA" in the last 168 hours.  ABG    Component Value Date/Time   TCO2 22 04/05/2023 1220     Coagulation Profile: Recent Labs  Lab 04/03/23 1130 04/05/23 1320  INR 1.0 1.4*    Cardiac Enzymes: No results for input(s): "CKTOTAL", "CKMB", "CKMBINDEX", "TROPONINI" in the last 168 hours.  HbA1C: Hemoglobin A1C  Date/Time Value Ref Range Status  01/27/2015 12:00 AM 6.7  Final   HbA1c POC (<> result, manual entry)  Date/Time Value Ref Range Status  08/26/2020 10:16 AM 6.7 4.0 - 5.6 % Final   Hgb A1c MFr Bld  Date/Time Value Ref Range Status  04/03/2023 11:20 AM 6.1 (H) 4.8 - 5.6 % Final    Comment:    (NOTE) Pre diabetes:          5.7%-6.4%  Diabetes:               >6.4%  Glycemic control for   <7.0% adults with diabetes   11/10/2022 11:00 AM 6.3 (H) 4.8 - 5.6 % Final    Comment:             Prediabetes: 5.7 - 6.4          Diabetes: >6.4          Glycemic control for adults with diabetes: <7.0     CBG: Recent Labs  Lab 04/03/23 1019 04/05/23 0759 04/05/23 1322  GLUCAP 179* 159* 107*    Review of Systems:   See HPI   Past Medical History:  She,  has a past medical history of Abdominal bloating (11/05/2020), Abnormality of right breast on screening mammogram (10/14/2019), Achilles tendon contracture, right, Acute bilateral low back pain without sciatica (08/04/2006), Acute non-recurrent frontal sinusitis (12/24/2019), Acute recurrent pansinusitis (08/04/2020), ALLERGIC RHINITIS (04/10/2008), Allergic rhinitis (04/10/2008), Anxiety, Arthritis, Asthma, Breast lump in female, Carpal tunnel syndrome of right wrist, Cellulitis (10/31/2019), Chronic right shoulder pain (05/07/2019), Complication of anesthesia, Coronary artery disease, DEPRESSION (08/13/2008), Diabetes mellitus without complication (HCC), Fatigue (11/17/2015), Fatty liver disease, nonalcoholic (03/29/2013), GERD (gastroesophageal reflux disease), Headache, HYPERLIPIDEMIA (08/04/2006), HYPERTENSION (08/04/2006), HYPOTHYROIDISM (08/04/2006), Insomnia (10/31/2019), Kidney injury, LOW BACK PAIN (08/04/2006), Moderate persistent asthma with (acute) exacerbation (06/27/2019), Neuromuscular disorder (HCC), Pain of finger of right hand (02/09/2021), Patellofemoral syndrome, right (06/19/2019), PERIMENOPAUSAL SYNDROME (05/08/2008), Plantar fasciitis of right foot (04/12/2017), Plantar fasciitis, left (11/17/2015), Pneumonia, Postmenopausal bleeding (07/17/2019), Primary insomnia (10/31/2019), PVD (peripheral vascular disease) (HCC), SOB (shortness of breath) (07/12/2016), and Vaginal candidiasis (10/31/2019).   Surgical History:   Past Surgical History:  Procedure Laterality Date   APPENDECTOMY   1985   CARPAL TUNNEL RELEASE Right 04/05/2016   Procedure: OPEN RIGHT CARPAL TUNNEL RELEASE;  Surgeon: Kerrin Champagne, MD;  Location: [facility];  Service: Orthopedics;  Laterality: Right;   CHOLECYSTECTOMY  1985   FEMORAL BYPASS     bifem.   HAGLAND'S DEFORMITY EXCISION Left    LEFT HEART CATH AND CORONARY ANGIOGRAPHY N/A  03/06/2023   Procedure: LEFT HEART CATH AND CORONARY ANGIOGRAPHY;  Surgeon: Yvonne Kendall, MD;  Location: [facility];  Service: Cardiovascular;  Laterality: N/A;   PLANTAR FASCIA RELEASE Right 04/18/2018   Procedure: PLANTAR FASCIA RELEASE AND GASTROCNEMIUS RECESSION RIGHT;  Surgeon: Nadara Mustard, MD;  Location: [facility] OR;  Service: Orthopedics;  Laterality: Right;   TUBAL LIGATION  1996     Social History:   reports that she quit smoking about 19 years ago. Her smoking use included cigarettes. She has never used smokeless tobacco. She reports current alcohol use. She reports that she does not use drugs.   Family History:  Her family history includes Alcohol abuse in her brother and maternal uncle; Diabetes in her maternal grandmother; Heart disease in her father; Hyperlipidemia in her father; Hypertension in her father and mother; Ovarian cancer in her daughter; Stroke in her maternal uncle.   Allergies Allergies  Allergen Reactions   Influenza Virus Vacc Split Pf Swelling and Other (See Comments)    Swelling around injection site   Gluten Meal Diarrhea   Lipitor [Atorvastatin] Other (See Comments)    Dizziness/headache     Home Medications  Prior to Admission medications   Medication Sig Start Date End Date Taking? Authorizing Provider  Albuterol-Budesonide (AIRSUPRA) 90-80 MCG/ACT AERO Inhale 1-2 Inhalations into the lungs every 6 (six) hours as needed for wheezing, cough, shortness of breath. 03/27/23  Yes Early, Sung Amabile, NP  ALPRAZolam Prudy Feeler) 0.5 MG tablet Take 0.5-1 tablets (0.25-0.5 mg total) by mouth 2 (two) times daily as needed for  anxiety. 02/13/23  Yes Early, Sung Amabile, NP  aspirin EC 81 MG tablet Take 1 tablet (81 mg total) by mouth daily. Swallow whole. 09/18/19  Yes Lewayne Bunting, MD  benazepril (LOTENSIN) 10 MG tablet Take 1 tablet (10 mg total) by mouth daily. 02/06/23  Yes Early, Sung Amabile, NP  celecoxib (CELEBREX) 200 MG capsule Take 1 capsule (200 mg total) by mouth daily. Patient taking differently: Take 200 mg by mouth daily as needed (pain.). 11/11/22  Yes Early, Sung Amabile, NP  cetirizine (ZYRTEC) 10 MG tablet Take 1 tablet (10 mg total) by mouth daily. 02/16/23  Yes Ashok Croon, MD  fluticasone (FLONASE) 50 MCG/ACT nasal spray Place 2 sprays into both nostrils 2 (two) times daily. Patient taking differently: Place 2 sprays into both nostrils 2 (two) times daily as needed for allergies. 02/16/23  Yes Ashok Croon, MD  fluticasone furoate-vilanterol (BREO ELLIPTA) 100-25 MCG/ACT AEPB Inhale 1 puff into the lungs daily. 03/27/23  Yes Early, Sung Amabile, NP  ibuprofen (ADVIL) 200 MG tablet Take 400 mg by mouth every 8 (eight) hours as needed (pain.).   Yes [provider]  isosorbide mononitrate (IMDUR) 30 MG 24 hr tablet Take 0.5 tablets (15 mg total) by mouth daily. 03/06/23 03/05/24 Yes End, Cristal Deer, MD  levothyroxine (SYNTHROID) 112 MCG tablet Take 1 tablet (112 mcg total) by mouth daily. 02/20/23  Yes Early, Sung Amabile, NP  Menthol, Topical Analgesic, (ICY HOT EX) Apply 1 application topically daily as needed (pain).   Yes [provider]  metoprolol succinate (TOPROL-XL) 100 MG 24 hr tablet Take 1 tablet (100 mg total) by mouth daily. Take with or immediately following a meal. 02/06/23  Yes Early, Sung Amabile, NP  nitroGLYCERIN (NITROSTAT) 0.4 MG SL tablet Place 1 tablet (0.4 mg total) under the tongue every 5 (five) minutes as needed for chest pain. Max three doses in a row. Proceed to ED for  evaluation if no improvement. 02/13/23  Yes Early, Sung Amabile, NP  pantoprazole (PROTONIX) 40 MG tablet Take 1 tablet (40 mg  total) by mouth daily. 02/09/23  Yes Early, Sung Amabile, NP  Probiotic Product (PROBIOTIC PO) Take 1 capsule by mouth in the morning.   Yes [provider]  rosuvastatin (CRESTOR) 40 MG tablet Take 1 tablet (40 mg total) by mouth daily. 02/09/23  Yes Early, Sung Amabile, NP  Tiotropium Bromide Monohydrate (SPIRIVA RESPIMAT) 2.5 MCG/ACT AERS Inhale 2 puffs into the lungs daily. 11/25/22  Yes Early, Sung Amabile, NP  tirzepatide [facility]) 12.5 MG/0.5ML Pen Inject 12.5 mg into the skin once a week. Patient taking differently: Inject 12.5 mg into the skin every Friday. 10/12/22  Yes Early, Sung Amabile, NP  venlafaxine XR (EFFEXOR-XR) 75 MG 24 hr capsule Take 3 capsules (225 mg total) by mouth daily with breakfast. 01/31/23  Yes Early, Sung Amabile, NP  Vitamin D, Ergocalciferol, (DRISDOL) 1.25 MG (50000 UNIT) CAPS capsule Take 1 capsule (50,000 Units total) by mouth every 7 (seven) days. Patient taking differently: Take 50,000 Units by mouth every Sunday. 03/23/23  Yes Early, Sung Amabile, NP  Azelaic Acid 15 % gel After skin is thoroughly washed and patted dry, gently but thoroughly massage a thin film of azelaic acid cream into the affected area twice daily, in the morning and evening. Patient not taking: Reported on 03/28/2023 11/17/21   Tollie Eth, NP  B Complex Vitamins (VITAMIN B COMPLEX PO) Take 1 tablet by mouth daily.    [provider]  Continuous Glucose Receiver (DEXCOM G7 RECEIVER) DEVI For use with Dexcom G7 sensor 06/14/22   Early, Sung Amabile, NP  Continuous Glucose Sensor (DEXCOM G7 SENSOR) MISC Apply new sensor every 10 days for continuous glucose monitoring. 06/14/22   Tollie Eth, NP  HYDROcodone bit-homatropine (HYCODAN) 5-1.5 MG/5ML syrup Take 5 mLs by mouth every 6 (six) hours as needed for cough. Patient not taking: Reported on 03/28/2023 03/21/23   Trevor Iha, FNP  predniSONE (STERAPRED UNI-PAK 21 TAB) 10 MG (21) TBPK tablet Take by mouth daily. Take 6 tabs by mouth daily  for 2 days, then 5 tabs for  2 days, then 4 tabs for 2 days, then 3 tabs for 2 days, 2 tabs for 2 days, then 1 tab by mouth daily for 2 days Patient not taking: Reported on 03/28/2023 03/21/23   Trevor Iha, FNP     Critical care time: 45 mins   Christian Everardo Voris AGACNP-BC   [facility] 04/05/2023, 2:36 PM  Please see [email or web address] for pager details.  From 7A-7P if no response, please call [phone]. After hours, please call ELink [phone].

## 2023-04-06 ENCOUNTER — Inpatient Hospital Stay (HOSPITAL_COMMUNITY): Payer: BC Managed Care – PPO

## 2023-04-06 ENCOUNTER — Encounter (HOSPITAL_COMMUNITY): Payer: Self-pay | Admitting: Thoracic Surgery (Cardiothoracic Vascular Surgery)

## 2023-04-06 DIAGNOSIS — I25110 Atherosclerotic heart disease of native coronary artery with unstable angina pectoris: Secondary | ICD-10-CM | POA: Diagnosis not present

## 2023-04-06 DIAGNOSIS — Z951 Presence of aortocoronary bypass graft: Secondary | ICD-10-CM | POA: Diagnosis not present

## 2023-04-06 DIAGNOSIS — J4540 Moderate persistent asthma, uncomplicated: Secondary | ICD-10-CM | POA: Diagnosis not present

## 2023-04-06 DIAGNOSIS — E039 Hypothyroidism, unspecified: Secondary | ICD-10-CM | POA: Diagnosis not present

## 2023-04-06 LAB — BASIC METABOLIC PANEL
Anion gap: 9 (ref 5–15)
Anion gap: 9 (ref 5–15)
BUN: 9 mg/dL (ref 8–23)
BUN: 11 mg/dL (ref 8–23)
CO2: 23 mmol/L (ref 22–32)
CO2: 25 mmol/L (ref 22–32)
Calcium: 8.7 mg/dL — ABNORMAL LOW (ref 8.9–10.3)
Calcium: 8.6 mg/dL — ABNORMAL LOW (ref 8.9–10.3)
Chloride: 106 mmol/L (ref 98–111)
Chloride: 102 mmol/L (ref 98–111)
Creatinine, Ser: 0.77 mg/dL (ref 0.44–1.00)
Creatinine, Ser: 0.85 mg/dL (ref 0.44–1.00)
GFR, Estimated: >60 mL/min (ref >60)
GFR, Estimated: >60 mL/min (ref >60)
Glucose, Bld: 142 mg/dL — ABNORMAL HIGH (ref 70–99)
Glucose, Bld: 161 mg/dL — ABNORMAL HIGH (ref 70–99)
Potassium: 4.1 mmol/L (ref 3.5–5.1)
Potassium: 4.2 mmol/L (ref 3.5–5.1)
Sodium: 138 mmol/L (ref 135–145)
Sodium: 136 mmol/L (ref 135–145)

## 2023-04-06 LAB — MAGNESIUM
Magnesium: 2.4 mg/dL (ref 1.7–2.4)
Magnesium: 2.3 mg/dL (ref 1.7–2.4)

## 2023-04-06 LAB — GLUCOSE, CAPILLARY
Glucose-Capillary: 120 mg/dL — ABNORMAL HIGH (ref 70–99)
Glucose-Capillary: 121 mg/dL — ABNORMAL HIGH (ref 70–99)
Glucose-Capillary: 123 mg/dL — ABNORMAL HIGH (ref 70–99)
Glucose-Capillary: 125 mg/dL — ABNORMAL HIGH (ref 70–99)
Glucose-Capillary: 126 mg/dL — ABNORMAL HIGH (ref 70–99)
Glucose-Capillary: 142 mg/dL — ABNORMAL HIGH (ref 70–99)
Glucose-Capillary: 155 mg/dL — ABNORMAL HIGH (ref 70–99)
Glucose-Capillary: 158 mg/dL — ABNORMAL HIGH (ref 70–99)
Glucose-Capillary: 160 mg/dL — ABNORMAL HIGH (ref 70–99)
Glucose-Capillary: 160 mg/dL — ABNORMAL HIGH (ref 70–99)
Glucose-Capillary: 166 mg/dL — ABNORMAL HIGH (ref 70–99)
Glucose-Capillary: 174 mg/dL — ABNORMAL HIGH (ref 70–99)
Glucose-Capillary: 181 mg/dL — ABNORMAL HIGH (ref 70–99)

## 2023-04-06 LAB — CBC
HCT: 37.8 % (ref 36.0–46.0)
HCT: 37.7 % (ref 36.0–46.0)
Hemoglobin: 12.8 g/dL (ref 12.0–15.0)
Hemoglobin: 12.5 g/dL (ref 12.0–15.0)
MCH: 30.6 pg (ref 26.0–34.0)
MCH: 30.2 pg (ref 26.0–34.0)
MCHC: 33.9 g/dL (ref 30.0–36.0)
MCHC: 33.2 g/dL (ref 30.0–36.0)
MCV: 90.4 fL (ref 80.0–100.0)
MCV: 91.1 fL (ref 80.0–100.0)
Platelets: 154 10*3/uL (ref 150–400)
Platelets: 144 10*3/uL — ABNORMAL LOW (ref 150–400)
RBC: 4.18 MIL/uL (ref 3.87–5.11)
RBC: 4.14 MIL/uL (ref 3.87–5.11)
RDW: 12.8 % (ref 11.5–15.5)
RDW: 12.8 % (ref 11.5–15.5)
WBC: 15 10*3/uL — ABNORMAL HIGH (ref 4.0–10.5)
WBC: 12.9 10*3/uL — ABNORMAL HIGH (ref 4.0–10.5)
nRBC: 0 % (ref 0.0–0.2)
nRBC: 0 % (ref 0.0–0.2)

## 2023-04-06 MED ORDER — INSULIN ASPART 100 UNIT/ML IJ SOLN
0.0000 [IU] | INTRAMUSCULAR | Status: DC
  Administered 2023-04-06 (×3): 4 [IU] via SUBCUTANEOUS
  Administered 2023-04-06 – 2023-04-07 (×2): 2 [IU] via SUBCUTANEOUS

## 2023-04-06 MED ORDER — INSULIN GLARGINE-YFGN 100 UNIT/ML ~~LOC~~ SOLN
18.0000 [IU] | Freq: Two times a day (BID) | SUBCUTANEOUS | Status: DC
  Administered 2023-04-06 – 2023-04-07 (×3): 18 [IU] via SUBCUTANEOUS
  Filled 2023-04-06 (×5): qty 0.18

## 2023-04-06 NOTE — Progress Notes (Signed)
      [address]       [place] [address]             [phone]      POD # 1 CABG  Up in chair  BP 135/80   Pulse 92   Temp (!) 97.5 F (36.4 C) (Oral)   Resp 17   Ht 5\' 4"  (1.626 m)   Wt 91.7 kg   SpO2 94%   BMI 34.70 kg/m    Intake/Output Summary (Last 24 hours) at 04/06/2023 1755 Last data filed at 04/06/2023 1600 Gross per 24 hour  Intake 636.05 ml  Output 1010 ml  Net -373.95 ml   K 4.2, creatinine 0.85 Hct 38 CBG moderately elevated in 160s  Doing well POD # 1  Donika Butner C. Dorris Fetch, MD [facility] [phone]

## 2023-04-06 NOTE — Progress Notes (Signed)
 NAME:  [name], MRN:  [id number], DOB:  11-Nov-1960, LOS: 1 ADMISSION DATE:  04/05/2023, CONSULTATION DATE: 04/05/23 REFERRING MD:  MD Lightfoot CHIEF COMPLAINT:  Coronary Artery Disease   History of Present Illness:  Patient is a 63 yr old female hx of CAD, PVD, DM, GERD, Fatty liver disease, GERD, HLD, HTN, and hypothyroidism who presented for elective off pump CABG on 04/05/2023 by MD Lightfoot. Off pump CABG x 3 was completed- left internal mammary artery for LAD, reverse saphenous graft for posterior descending artery, and diagonal with right greater saphenous vein harvested. PCCM was consulted for post op vent and further medical management.   Pertinent  Medical History  Anxiety Asthma CAD Depression Type 2 diabetes GERD HTN HLD Hypothyroidism Asthma  Significant Hospital Events: Including procedures, antibiotic start and stop dates in addition to other pertinent events   2/19 Admit for elective CABG x 3- LIMA LAD, RSVG PDA, Diagonal, PCCM consult for post-op vent management.  2/20 tolerated extubation yesterday afternoon, remains on insulin drip  Interim History / Subjective:  Seen lying in bed in no acute distress, states that she feels well  Objective   Blood pressure 122/88, pulse 92, temperature 99.1 F (37.3 C), resp. rate 14, height 5\' 4"  (1.626 m), weight 91.7 kg, SpO2 93%. CVP:  [0 mmHg-20 mmHg] 4 mmHg CO:  [3.8 L/min-8.8 L/min] 5.7 L/min CI:  [2 L/min/m2-4.6 L/min/m2] 2.9 L/min/m2  Vent Mode: CPAP;PSV FiO2 (%):  [32 %-50 %] 32 % Set Rate:  [4 bmp-16 bmp] 4 bmp Vt Set:  [420 mL-440 mL] 440 mL PEEP:  [5 cmH20] 5 cmH20 Pressure Support:  [10 cmH20] 10 cmH20 Plateau Pressure:  [12 cmH20] 12 cmH20   Intake/Output Summary (Last 24 hours) at 04/06/2023 4098 Last data filed at 04/06/2023 0900 Gross per 24 hour  Intake 4738.3 ml  Output 1860 ml  Net 2878.3 ml   Filed Weights   04/05/23 0800 04/06/23 1191  Weight: 87.5 kg 91.7 kg    Examination: General:  Acute on chronic ill-appearing deconditioned middle-aged female in no acute distress HEENT: [place]/AT, MM pink/moist, PERRL,  Neuro: Alert and oriented x 3, nonfocal CV: s1s2 regular rate and rhythm, no murmur, rubs, or gallops,  PULM: Clear to auscultation bilaterally, no increased work of breathing, no added breath sounds GI: soft, bowel sounds active in all 4 quadrants, non-tender, non-distended, tolerating clear liquid diet Extremities: warm/dry, no edema  Skin: no rashes or lesions  Resolved Hospital Problem list     Assessment & Plan:  CAD hx with triple vessel disease - s/p elective CABG x 3 -LIMA-LAD, RSVG-PDA, Diagonal-right great saphenous vein  Hx HTN, HLD P: Primary management per TCTS Continue aspirin and statin Insulin drip per protocol Chest tube management per TCTS Continue beta-blocker Multimodal pain control Mobilize  History of asthma P: Wean supplemental oxygen as able, goal of room air Scheduled Incruse As needed bronchodilators Continue home Zyrtec  Leukocytosis in setting of s/p CABG  -Suspecting inflammatory reactive response due to surgery On surgical prophylactic vanc/ancef P: Trend CBC and fever curve No indications of active infection currently  DM2  P:  Remains on insulin drip, per protocol CBG checks every 4  HTN P: Resume home medications when appropriate Continuous telemetry  Non-Alcoholic Fatty Liver disease P: Supportive care Avoid hepatotoxins  GERD P: Continue PPI  HLD P: Continue statin  Hypothyroidism  P: Continue home Synthroid  Depression  P: Continue home Effexor   Best Practice (right click and "Reselect  all SmartList Selections" daily)   Diet/type: NPO DVT prophylaxis not indicated Pressure ulcer(s): N/A GI prophylaxis: PPI Lines: Central line and yes and it is still needed Foley:  Yes, and it is still needed and Yes, and it is no longer needed Code Status:  full code Last date of multidisciplinary  goals of care discussion-per primary   Critical care time:   CRITICAL CARE Performed by: Korbyn Vanes D. Harris  Total critical care time: 38 minutes  Critical care time was exclusive of separately billable procedures and treating other patients.  Critical care was necessary to treat or prevent imminent or life-threatening deterioration.  Critical care was time spent personally by me on the following activities: development of treatment plan with patient and/or surrogate as well as nursing, discussions with consultants, evaluation of patient's response to treatment, examination of patient, obtaining history from patient or surrogate, ordering and performing treatments and interventions, ordering and review of laboratory studies, ordering and review of radiographic studies, pulse oximetry and re-evaluation of patient's condition.]  Markale Birdsell D. Harris, NP-C [facility] Personal contact information can be found on Amion  If no contact or response made please call [phone] 04/06/2023, 9:49 AM

## 2023-04-06 NOTE — Progress Notes (Signed)
      [address]       [place] [address]             [phone]                 1 Day Post-Op Procedure(s) (LRB): OFF PUMP CORONARY ARTERY BYPASS GRAFTING (CABG) TIMES THREE  USING LEFT INTERNAL MAMMARY ARTERY  AND ENDOSCOPICALLY HARVESTED RIGHT GREATER SAPHENOUS VEIN (N/A) TRANSESOPHAGEAL ECHOCARDIOGRAM (TEE) (N/A)   Events: No events _______________________________________________________________ Vitals: BP (!) 125/100   Pulse 99   Temp 99.9 F (37.7 C)   Resp 18   Ht 5\' 4"  (1.626 m)   Wt 91.7 kg   SpO2 93%   BMI 34.70 kg/m  Filed Weights   04/05/23 0800 04/06/23 0635  Weight: 87.5 kg 91.7 kg     - Neuro: alert NAD  - Cardiovascular: sinus   Drips: none.   CVP:  [0 mmHg-20 mmHg] 6 mmHg CO:  [3.8 L/min-8.8 L/min] 5.8 L/min CI:  [2 L/min/m2-4.6 L/min/m2] 3 L/min/m2  - Pulm: EWOB  ABG    Component Value Date/Time   PHART 7.341 (L) 04/05/2023 1720   PCO2ART 47.5 04/05/2023 1720   PO2ART 77 (L) 04/05/2023 1720   HCO3 25.8 04/05/2023 1720   TCO2 27 04/05/2023 1720   ACIDBASEDEF 1.0 04/05/2023 1720   O2SAT 95 04/05/2023 1720    - Abd: ND - Extremity: warm  .Intake/Output      02/19 0701 02/20 0700 02/20 0701 02/21 0700   P.O. 100    I.V. (mL/kg) 2877.6 (31.4)    Blood 230    IV Piggyback 1518.8    Total Intake(mL/kg) 4726.4 (51.5)    Urine (mL/kg/hr) 1360    Emesis/NG output 30    Chest Tube 430    Total Output 1820    Net +2906.4            _______________________________________________________________ Labs:    Latest Ref Rng & Units 04/06/2023    5:00 AM 04/05/2023    6:47 PM 04/05/2023    5:20 PM  CBC  WBC 4.0 - 10.5 K/uL 15.0  14.7    Hemoglobin 12.0 - 15.0 g/dL 91.4  78.2  95.6   Hematocrit 36.0 - 46.0 % 37.8  36.8  33.0   Platelets 150 - 400 K/uL 154  132        Latest Ref Rng & Units 04/06/2023    5:00 AM 04/05/2023    6:47 PM 04/05/2023    5:20 PM  CMP  Glucose 70 - 99 mg/dL 213  086    BUN 8 - 23 mg/dL  9  11    Creatinine 5.78 - 1.00 mg/dL 4.69  6.29    Sodium 528 - 145 mmol/L 138  141  142   Potassium 3.5 - 5.1 mmol/L 4.1  4.0  4.4   Chloride 98 - 111 mmol/L 106  111    CO2 22 - 32 mmol/L 23  24    Calcium 8.9 - 10.3 mg/dL 8.7  7.4      CXR: PV congestion  _______________________________________________________________  Assessment and Plan: POD 1 s/p CABG  Neuro: pain controlled CV: on A/S/BB.  Will remove wires.  Titrating BB Pulm: IS, ambulation Renal: creat stable GI: on diet Heme: stablte ID: [email or web address] Endo: SSI Dispo: continue ICU care, possible floor   Audley Hinojos O [place] Firkus 04/06/2023 8:24 AM

## 2023-04-06 NOTE — Plan of Care (Signed)
  Problem: Nutrition: Goal: Adequate nutrition will be maintained Outcome: Progressing   Problem: Cardiac: Goal: Will achieve and/or maintain hemodynamic stability Outcome: Progressing

## 2023-04-06 NOTE — TOC Initial Note (Signed)
 Transition of Care [facility]) - Initial/Assessment Note    Patient Details  Name: [name] MRN: [id number] Date of Birth: September 18, 1960  Transition of Care [facility]) CM/SW Contact:    Gala Lewandowsky, RN Phone Number: 04/06/2023, 12:08 PM  Clinical Narrative: Patient presented for 3 vessel CAD-POD-1 CABG. PTA patient was independent from home with spouse. Patient has insurance and PCP. Case Manager will continue to follow for transition of care needs as the patient progresses.                Expected Discharge Plan: Home w Home Health Services Barriers to Discharge: Continued Medical Work up   Patient Goals and CMS Choice Patient states their goals for this hospitalization and ongoing recovery are:: plan to return home once stable.   Expected Discharge Plan and Services   Discharge Planning Services: CM Consult   Living arrangements for the past 2 months: Single Family Home  Prior Living Arrangements/Services Living arrangements for the past 2 months: Single Family Home Lives with:: Spouse Patient language and need for interpreter reviewed:: Yes        Need for Family Participation in Patient Care: Yes (Comment) Care giver support system in place?: Yes (comment)   Criminal Activity/Legal Involvement Pertinent to Current Situation/Hospitalization: No - Comment as needed  Activities of Daily Living   ADL Screening (condition at time of admission) Independently performs ADLs?: Yes (appropriate for developmental age) Is the patient deaf or have difficulty hearing?: No Does the patient have difficulty seeing, even when wearing glasses/contacts?: No Does the patient have difficulty concentrating, remembering, or making decisions?: No  Emotional Assessment Appearance:: Appears stated age Attitude/Demeanor/Rapport: Engaged Affect (typically observed): Appropriate Orientation: : Oriented to Self, Oriented to  Time, Oriented to Place Alcohol / Substance Use: Not  Applicable Psych Involvement: No (comment)  Admission diagnosis:  Coronary artery disease [I25.10] Patient Active Problem List   Diagnosis Date Noted   On mechanically assisted ventilation (HCC) 04/05/2023   Coronary artery disease 03/15/2023   Carotid artery disease (HCC) 02/27/2023   Acute recurrent pansinusitis 02/13/2023   Chest pain on exertion 02/13/2023   Pain 02/13/2023   Family history of dementia 09/13/2022   Chondromalacia of right patella 12/17/2021   Osteoarthritis of right acromioclavicular joint 03/09/2021   Subacromial impingement of right shoulder 12/31/2020   Moderate persistent asthma without complication 11/27/2020   Reflux gastritis 11/05/2020   Rosacea 08/04/2020   Situational anxiety 05/11/2020   Body mass index (BMI) of 36.0-36.9 in adult 05/11/2020   DDD (degenerative disc disease), cervical 08/30/2019   Nonalcoholic steatohepatitis (NASH) 08/08/2019   Migraine with aura and without status migrainosus, not intractable 05/31/2017   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   Diabetes 1.5, 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   Type 2 diabetes mellitus with complications (HCC) 01/14/2015   Vitamin D deficiency 01/14/2015   Peripheral vascular disease (HCC) 07/15/2011   Hypothyroidism 08/04/2006   Hyperlipidemia associated with type 2 diabetes mellitus (HCC) 08/04/2006   Hypertension complicating diabetes (HCC) 08/04/2006   PCP:  Tollie Eth, NP Pharmacy:   [facility] [address] [place] [address] Phone: [phone] Fax: [phone]  [facility] [phone] - [place] [place] - [address] [place] [address] Phone: [phone] Fax: [phone]  Social Drivers of Health (SDOH) Social History: SDOH Screenings   Depression (PHQ2-9): Low Risk  (06/14/2021)  Social Connections: Unknown (06/28/2021)   Received from [facility], [facility]  Tobacco Use:  Medium Risk (04/05/2023)   Readmission Risk Interventions     No data to display

## 2023-04-06 NOTE — Inpatient Diabetes Management (Signed)
 Inpatient Diabetes Program Recommendations  AACE/ADA: New Consensus Statement on Inpatient Glycemic Control (2015)  Target Ranges:  Prepandial:   less than 140 mg/dL      Peak postprandial:   less than 180 mg/dL (1-2 hours)      Critically ill patients:  140 - 180 mg/dL   Lab Results  Component Value Date   GLUCAP 121 (H) 04/06/2023   HGBA1C 6.1 (H) 04/03/2023    Review of Glycemic Control  Latest Reference Range & Units 04/06/23 03:03 04/06/23 05:15 04/06/23 06:50 04/06/23 07:56 04/06/23 08:46 04/06/23 09:52 04/06/23 11:59  Glucose-Capillary 70 - 99 mg/dL 161 (H) 096 (H) 045 (H) 142 (H) 123 (H) 155 (H) 121 (H)   Diabetes history: DM  Outpatient Diabetes medications:  Dexcom G7 Mounjaro 12.5 mg weekly Current orders for Inpatient glycemic control:  Novolog 0-24 units q 4 hours  Inpatient Diabetes Program Recommendations:    Current insulin drip rate is 3.2 units/ hr.  May benefit from basal insulin post-op. Consider adding Semglee 18 units bid.   Thanks,  Lorenza Cambridge, RN, BC-ADM Inpatient Diabetes Coordinator Pager [phone]  (8a-5p)

## 2023-04-06 NOTE — Progress Notes (Signed)
 NAME:  [name], MRN:  [id number], DOB:  1960/10/15, LOS: 1 ADMISSION DATE:  04/05/2023, CONSULTATION DATE: 04/05/23 REFERRING MD:  MD Lightfoot CHIEF COMPLAINT:  Coronary Artery Disease   History of Present Illness:  Patient is a 63 yr old female hx of CAD, PVD, DM, GERD, Fatty liver disease, GERD, HLD, HTN, and hypothyroidism who presented for elective off pump CABG on 04/05/2023 by MD Lightfoot. Off pump CABG x 3 was completed- left internal mammary artery for LAD, reverse saphenous graft for posterior descending artery, and diagonal with right greater saphenous vein harvested. PCCM was consulted for post op vent and further medical management.   Pertinent  Medical History   Past Medical History:  Diagnosis Date   Abdominal bloating 11/05/2020   Abnormality of right breast on screening mammogram 10/14/2019   Needs further views of right breast.  Breast center to call.       Achilles tendon contracture, right    Acute bilateral low back pain without sciatica 08/04/2006   Qualifier: Diagnosis of   By: Marcelyn Ditty, RN, Katy Fitch        Acute non-recurrent frontal sinusitis 12/24/2019   Acute recurrent pansinusitis 08/04/2020   ALLERGIC RHINITIS 04/10/2008   Allergic rhinitis 04/10/2008   Qualifier: Diagnosis of  By: Amador Cunas  MD, Janett Labella    Anxiety    Arthritis    Asthma    asthmatic bronchitis   Breast lump in female    Carpal tunnel syndrome of right wrist    Cellulitis 10/31/2019   Chronic right shoulder pain 05/07/2019   Complication of anesthesia    trouble waking up after tubal ligation   Coronary artery disease    DEPRESSION 08/13/2008   Diabetes mellitus without complication (HCC)    Fatigue 11/17/2015   Fatty liver disease, nonalcoholic 03/29/2013   GERD (gastroesophageal reflux disease)    Headache    Occasional Migraines   HYPERLIPIDEMIA 08/04/2006   HYPERTENSION 08/04/2006   HYPOTHYROIDISM 08/04/2006   Insomnia 10/31/2019   Kidney injury    LOW BACK PAIN  08/04/2006   Moderate persistent asthma with (acute) exacerbation 06/27/2019   Neuromuscular disorder (HCC)    Pain of finger of right hand 02/09/2021   Patellofemoral syndrome, right 06/19/2019   PERIMENOPAUSAL SYNDROME 05/08/2008   Plantar fasciitis of right foot 04/12/2017   Plantar fasciitis, left 11/17/2015   Pneumonia    Postmenopausal bleeding 07/17/2019   Biopsy and [place] c/w endometrial polyp.  Pt to see Dr. Earlene Plater for surgical consult.     Primary insomnia 10/31/2019   PVD (peripheral vascular disease) (HCC)    occlusive, status post bifem bypass 2007   SOB (shortness of breath) 07/12/2016   Vaginal candidiasis 10/31/2019     Significant Hospital Events: Including procedures, antibiotic start and stop dates in addition to other pertinent events   Admit for elective CABG x 3- LIMA LAD, RSVG PDA, Diagonal, PCCM consult for post-op vent management.  Interim History / Subjective:  She denies complaints, mild pain. Tmax 100.   Objective   Blood pressure (!) 125/100, pulse 99, temperature 99.9 F (37.7 C), resp. rate 18, height 5\' 4"  (1.626 m), weight 91.7 kg, SpO2 93%. CVP:  [0 mmHg-20 mmHg] 6 mmHg CO:  [3.8 L/min-8.8 L/min] 5.8 L/min CI:  [2 L/min/m2-4.6 L/min/m2] 3 L/min/m2  Vent Mode: CPAP;PSV FiO2 (%):  [40 %-50 %] 40 % Set Rate:  [4 bmp-16 bmp] 4 bmp Vt Set:  [420 mL-440 mL] 440 mL PEEP:  [5 cmH20]  5 cmH20 Pressure Support:  [10 cmH20] 10 cmH20 Plateau Pressure:  [12 cmH20] 12 cmH20   Intake/Output Summary (Last 24 hours) at 04/06/2023 0721 Last data filed at 04/06/2023 0641 Gross per 24 hour  Intake 4726.42 ml  Output 1820 ml  Net 2906.42 ml   Filed Weights   04/05/23 0800 04/06/23 0635  Weight: 87.5 kg 91.7 kg    Examination: General: ill appearing woman lying in bed in NAD HENT: [place]/AT, eyes anicteric Lungs: breathing comfortably on [place], reduced basilar breath sounds, only getting about 250cc on IS. No wheezing, no conversational dyspnea.  Cardiovascular:  S1S2, RRR. Serous output from chest tubes. Abdomen: soft, NT Extremities: mild edema,no cyanosis  Neuro: RASS -3, pupils pinpoint-sluggish GU: foley in place, yellow urine  BUN 9 Cr 0.77 WBC 15 H/H 12.8/37.8 Platelets 154  CXR personally reviewed> low lung volumes, left effusion, atelectasis EKG personally reviewed> NSR, normal axis, Q waves in III, no acute ischemic changes.  Resolved Hospital Problem list   N/a   Assessment & Plan:  CAD hx with triple vessel disease - s/p elective CABG x 3, LIMA-LAD, RSVG-PDA, Diagonal-right great saphenous vein  Hx HTN, HLD -post-op care per TCTS; con't chest tubes -d/c A-line -start metoprolol, aspirin, statin -tele monitoring -pain control per protocol -advance diet and mobility -complete post-op antibiotics -hold PTA Imdur,Toprol XL  Post op vent management Moderate persistent asthma, on triple inhaled therapy -IS, pulmonary hygiene -OOB mobility -con't triple inhaled therapy-Breo, Anoro  DM2, hyperglycemia due to surgery. A1c 6.1 -transition to basal bolus insulin -hold PTA Monjaro -goal BG <140  HTN -hold PTA Imdur, Toprol  NAFLD -long term recommend modest weight loss -statin  GERD -con't PPI, PTA med  HLD -statin  Hypothyroidism  -synthroid  Depression, anxiety  -Effexor &PTA xanax  No family at bedside this morning.  Best Practice (right click and "Reselect all SmartList Selections" daily)   Diet/type: Regular consistency (see orders) DVT prophylaxis SCD Pressure ulcer(s): N/A GI prophylaxis: PPI Lines: Central line and yes and it is still needed Foley:  Yes, and it is still needed Code Status:  full code Last date of multidisciplinary goals of care discussion-   Labs   CBC: Recent Labs  Lab 04/03/23 1130 04/05/23 0901 04/05/23 1320 04/05/23 1324 04/05/23 1555 04/05/23 1720 04/05/23 1847 04/06/23 0500  WBC 11.1*  --  22.3*  --   --   --  14.7* 15.0*  HGB 15.0   < > 11.6* 10.2* 10.5*  11.2* 12.5 12.8  HCT 44.3   < > 33.7* 30.0* 31.0* 33.0* 36.8 37.8  MCV 90.2  --  89.4  --   --   --  90.2 90.4  PLT 161  --  119*  --   --   --  132* 154   < > = values in this interval not displayed.    Basic Metabolic Panel: Recent Labs  Lab 04/03/23 1130 04/05/23 0901 04/05/23 1040 04/05/23 1220 04/05/23 1324 04/05/23 1555 04/05/23 1720 04/05/23 1847 04/06/23 0500  NA 138 139 138 140 141 140 142 141 138  K 4.3 3.7 3.9 3.5 3.2* 4.2 4.4 4.0 4.1  CL 105 104 105 106  --   --   --  111 106  CO2 24  --   --   --   --   --   --  24 23  GLUCOSE 169* 178* 176* 132*  --   --   --  144* 142*  BUN 16  15 14 12   --   --   --  11 9  CREATININE 0.88 0.70 0.70 0.60  --   --   --  0.68 0.77  CALCIUM 9.2  --   --   --   --   --   --  7.4* 8.7*  MG  --   --   --   --   --   --   --  2.6* 2.4   GFR: Estimated Creatinine Clearance: 80 mL/min (by C-G formula based on SCr of 0.77 mg/dL). Recent Labs  Lab 04/03/23 1130 04/05/23 1320 04/05/23 1847 04/06/23 0500  WBC 11.1* 22.3* 14.7* 15.0*    Liver Function Tests: Recent Labs  Lab 04/03/23 1130  AST 29  ALT 40  ALKPHOS 75  BILITOT 0.7  PROT 6.9  ALBUMIN 3.6      Critical care time:       Steffanie Dunn, DO 04/06/23 3:24 PM [facility]  For contact information, see Amion. If no response to pager, please call PCCM consult pager. After hours, 7PM- 7AM, please call Elink.

## 2023-04-07 ENCOUNTER — Inpatient Hospital Stay (HOSPITAL_COMMUNITY): Payer: BC Managed Care – PPO

## 2023-04-07 DIAGNOSIS — E039 Hypothyroidism, unspecified: Secondary | ICD-10-CM | POA: Diagnosis not present

## 2023-04-07 DIAGNOSIS — I25110 Atherosclerotic heart disease of native coronary artery with unstable angina pectoris: Secondary | ICD-10-CM | POA: Diagnosis not present

## 2023-04-07 DIAGNOSIS — J4540 Moderate persistent asthma, uncomplicated: Secondary | ICD-10-CM | POA: Diagnosis not present

## 2023-04-07 DIAGNOSIS — Z951 Presence of aortocoronary bypass graft: Secondary | ICD-10-CM | POA: Diagnosis not present

## 2023-04-07 LAB — ECHO INTRAOPERATIVE TEE
AR max vel: 1.64 cm2
AV Area VTI: 1.87 cm2
AV Area mean vel: 1.85 cm2
AV Mean grad: 6 mm[Hg]
AV Peak grad: 14.3 mm[Hg]
Ao pk vel: 1.89 m/s
Area-P 1/2: 2.82 cm2
Height: 64 in
MV VTI: 2.35 cm2
Weight: 3088 [oz_av]

## 2023-04-07 LAB — BASIC METABOLIC PANEL
Anion gap: 8 (ref 5–15)
BUN: 9 mg/dL (ref 8–23)
CO2: 26 mmol/L (ref 22–32)
Calcium: 8.6 mg/dL — ABNORMAL LOW (ref 8.9–10.3)
Chloride: 102 mmol/L (ref 98–111)
Creatinine, Ser: 0.82 mg/dL (ref 0.44–1.00)
GFR, Estimated: >60 mL/min (ref >60)
Glucose, Bld: 151 mg/dL — ABNORMAL HIGH (ref 70–99)
Potassium: 4 mmol/L (ref 3.5–5.1)
Sodium: 136 mmol/L (ref 135–145)

## 2023-04-07 LAB — CBC
HCT: 34.8 % — ABNORMAL LOW (ref 36.0–46.0)
Hemoglobin: 11.6 g/dL — ABNORMAL LOW (ref 12.0–15.0)
MCH: 30.1 pg (ref 26.0–34.0)
MCHC: 33.3 g/dL (ref 30.0–36.0)
MCV: 90.2 fL (ref 80.0–100.0)
Platelets: 133 10*3/uL — ABNORMAL LOW (ref 150–400)
RBC: 3.86 MIL/uL — ABNORMAL LOW (ref 3.87–5.11)
RDW: 12.7 % (ref 11.5–15.5)
WBC: 12.7 10*3/uL — ABNORMAL HIGH (ref 4.0–10.5)
nRBC: 0 % (ref 0.0–0.2)

## 2023-04-07 LAB — GLUCOSE, CAPILLARY
Glucose-Capillary: 145 mg/dL — ABNORMAL HIGH (ref 70–99)
Glucose-Capillary: 196 mg/dL — ABNORMAL HIGH (ref 70–99)
Glucose-Capillary: 173 mg/dL — ABNORMAL HIGH (ref 70–99)
Glucose-Capillary: 145 mg/dL — ABNORMAL HIGH (ref 70–99)
Glucose-Capillary: 151 mg/dL — ABNORMAL HIGH (ref 70–99)

## 2023-04-07 MED ORDER — FUROSEMIDE 40 MG PO TABS
40.0000 mg | ORAL_TABLET | Freq: Every day | ORAL | Status: DC
  Administered 2023-04-07 – 2023-04-09 (×3): 40 mg via ORAL
  Filled 2023-04-07 (×3): qty 1

## 2023-04-07 MED ORDER — INSULIN GLARGINE-YFGN 100 UNIT/ML ~~LOC~~ SOLN
20.0000 [IU] | Freq: Two times a day (BID) | SUBCUTANEOUS | Status: DC
  Administered 2023-04-07 – 2023-04-08 (×2): 20 [IU] via SUBCUTANEOUS
  Filled 2023-04-07 (×6): qty 0.2

## 2023-04-07 MED ORDER — INSULIN ASPART 100 UNIT/ML IJ SOLN
0.0000 [IU] | Freq: Three times a day (TID) | INTRAMUSCULAR | Status: DC
  Administered 2023-04-07: 4 [IU] via SUBCUTANEOUS
  Administered 2023-04-07 – 2023-04-09 (×4): 2 [IU] via SUBCUTANEOUS

## 2023-04-07 MED ORDER — METOPROLOL TARTRATE 25 MG PO TABS
25.0000 mg | ORAL_TABLET | Freq: Two times a day (BID) | ORAL | Status: DC
  Administered 2023-04-07 – 2023-04-08 (×3): 25 mg via ORAL
  Filled 2023-04-07 (×3): qty 1

## 2023-04-07 MED ORDER — METOPROLOL TARTRATE 25 MG/10 ML ORAL SUSPENSION
25.0000 mg | Freq: Two times a day (BID) | ORAL | Status: DC
  Filled 2023-04-07 (×3): qty 10

## 2023-04-07 MED ORDER — GUAIFENESIN ER 600 MG PO TB12
600.0000 mg | ORAL_TABLET | Freq: Two times a day (BID) | ORAL | Status: DC
  Administered 2023-04-07 – 2023-04-09 (×5): 600 mg via ORAL
  Filled 2023-04-07 (×5): qty 1

## 2023-04-07 MED ORDER — INSULIN ASPART 100 UNIT/ML IJ SOLN
4.0000 [IU] | Freq: Three times a day (TID) | INTRAMUSCULAR | Status: DC
  Administered 2023-04-07 – 2023-04-09 (×6): 4 [IU] via SUBCUTANEOUS

## 2023-04-07 MED ORDER — POTASSIUM CHLORIDE CRYS ER 20 MEQ PO TBCR
20.0000 meq | EXTENDED_RELEASE_TABLET | Freq: Every day | ORAL | Status: DC
  Administered 2023-04-07 – 2023-04-09 (×3): 20 meq via ORAL
  Filled 2023-04-07 (×4): qty 1

## 2023-04-07 NOTE — Anesthesia Postprocedure Evaluation (Signed)
 Anesthesia Post Note  Patient: [name]  Procedure(s) Performed: OFF PUMP CORONARY ARTERY BYPASS GRAFTING (CABG) TIMES THREE  USING LEFT INTERNAL MAMMARY ARTERY  AND ENDOSCOPICALLY HARVESTED RIGHT GREATER SAPHENOUS VEIN (Chest) TRANSESOPHAGEAL ECHOCARDIOGRAM (TEE)     Patient location during evaluation: SICU Anesthesia Type: General Level of consciousness: sedated Pain management: pain level controlled Vital Signs Assessment: post-procedure vital signs reviewed and stable Respiratory status: patient remains intubated per anesthesia plan Cardiovascular status: stable Postop Assessment: no apparent nausea or vomiting Anesthetic complications: no   No notable events documented.                Jules Baty

## 2023-04-07 NOTE — Progress Notes (Signed)
 Patient arrived at the unit from 2H,pt alert and oriented ,vitals taken,CCMD notified,CHG bath given ,call bell in reach

## 2023-04-07 NOTE — Progress Notes (Signed)
 NAME:  [name], MRN:  [id number], DOB:  Jun 15, 1960, LOS: 2 ADMISSION DATE:  04/05/2023, CONSULTATION DATE: 04/05/23 REFERRING MD:  MD Lightfoot CHIEF COMPLAINT:  Coronary Artery Disease   History of Present Illness:  Patient is a 63 yr old female hx of CAD, PVD, DM, GERD, Fatty liver disease, GERD, HLD, HTN, and hypothyroidism who presented for elective off pump CABG on 04/05/2023 by MD Lightfoot. Off pump CABG x 3 was completed- left internal mammary artery for LAD, reverse saphenous graft for posterior descending artery, and diagonal with right greater saphenous vein harvested. PCCM was consulted for post op vent and further medical management.   Pertinent  Medical History   Past Medical History:  Diagnosis Date   Abdominal bloating 11/05/2020   Abnormality of right breast on screening mammogram 10/14/2019   Needs further views of right breast.  Breast center to call.       Achilles tendon contracture, right    Acute bilateral low back pain without sciatica 08/04/2006   Qualifier: Diagnosis of   By: Marcelyn Ditty, RN, Katy Fitch        Acute non-recurrent frontal sinusitis 12/24/2019   Acute recurrent pansinusitis 08/04/2020   ALLERGIC RHINITIS 04/10/2008   Allergic rhinitis 04/10/2008   Qualifier: Diagnosis of  By: Amador Cunas  MD, Janett Labella    Anxiety    Arthritis    Asthma    asthmatic bronchitis   Breast lump in female    Carpal tunnel syndrome of right wrist    Cellulitis 10/31/2019   Chronic right shoulder pain 05/07/2019   Complication of anesthesia    trouble waking up after tubal ligation   Coronary artery disease    DEPRESSION 08/13/2008   Diabetes mellitus without complication (HCC)    Fatigue 11/17/2015   Fatty liver disease, nonalcoholic 03/29/2013   GERD (gastroesophageal reflux disease)    Headache    Occasional Migraines   HYPERLIPIDEMIA 08/04/2006   HYPERTENSION 08/04/2006   HYPOTHYROIDISM 08/04/2006   Insomnia 10/31/2019   Kidney injury    LOW BACK PAIN  08/04/2006   Moderate persistent asthma with (acute) exacerbation 06/27/2019   Neuromuscular disorder (HCC)    Pain of finger of right hand 02/09/2021   Patellofemoral syndrome, right 06/19/2019   PERIMENOPAUSAL SYNDROME 05/08/2008   Plantar fasciitis of right foot 04/12/2017   Plantar fasciitis, left 11/17/2015   Pneumonia    Postmenopausal bleeding 07/17/2019   Biopsy and [place] c/w endometrial polyp.  Pt to see Dr. Earlene Plater for surgical consult.     Primary insomnia 10/31/2019   PVD (peripheral vascular disease) (HCC)    occlusive, status post bifem bypass 2007   SOB (shortness of breath) 07/12/2016   Vaginal candidiasis 10/31/2019     Significant Hospital Events: Including procedures, antibiotic start and stop dates in addition to other pertinent events   Admit for elective CABG x 3- LIMA LAD, RSVG PDA, Diagonal, PCCM consult for post-op vent management.  Interim History / Subjective:  Denies complaints, pain is reasonably well-controlled.  No nausea.  Objective   Blood pressure (!) 144/81, pulse 93, temperature 98.7 F (37.1 C), temperature source Oral, resp. rate (!) 23, height 5\' 4"  (1.626 m), weight 90.1 kg, SpO2 95%. CVP:  [4 mmHg-8 mmHg] 8 mmHg CO:  [5.7 L/min-6.1 L/min] 6.1 L/min CI:  [2.9 L/min/m2-3.1 L/min/m2] 3.1 L/min/m2  FiO2 (%):  [32 %] 32 %   Intake/Output Summary (Last 24 hours) at 04/07/2023 0716 Last data filed at 04/07/2023 0600 Gross per 24  hour  Intake 613.48 ml  Output 450 ml  Net 163.48 ml   Filed Weights   04/05/23 0800 04/06/23 0635 04/07/23 0500  Weight: 87.5 kg 91.7 kg 90.1 kg    Examination: General: Elderly woman sitting up in the recliner no acute distress HENT: [place]/AT, eyes anicteric Lungs: Breathing comfortably on nasal cannula, minimal chest tube output.  Reduced basilar breath sounds.  No conversational dyspnea. Cardiovascular: S1-S2, regular rate and rhythm  abdomen: Obese, soft, nontender Extremities: Mild edema, no cyanosis or  clubbing Neuro: Awake and alert, answering questions appropriately Derm: Warm, dry, no diffuse rashes  BUN 9 Cr 0.82 WBC 12.7 H/H 11.6/34.8 Platelets 133  CXR personally reviewed> still has low lung volumes, left effusion, no infiltrates   Resolved Hospital Problem list   N/a   Assessment & Plan:  CAD hx with triple vessel disease - s/p elective CABG x 3, LIMA-LAD, RSVG-PDA, Diagonal-right great saphenous vein  Hx HTN, HLD -post-op care per TCTS -d/c chest tubes, CVC today  -metoprolol- increase to 25mg  BID -aspirin, statin -pain control per protocol -holding PTA Imdur and metoprolol  Post op vent management Moderate persistent asthma, on triple inhaled therapy -pulmonary hygiene, IS -mobility -con't Anoro & Breo; PTA on Trelegy  DM2, hyperglycemia due to surgery. A1c 6.1 -holding PTA Monjaro -glargine increased to 22 units BID -mealtime insulin added -SSI TIDAC + QHS -goal BG <180  HTN -increase metoprolol, holding PTA Imdur  NAFLD -statin -long term recommend weight loss  GERD -con't PPI; PTA med  HLD -statin  Hypothyroidism  -con't synthroid  Depression, anxiety  -con't PTA effexor & xanax  Stable to transfer to the floor. PCCM will be available as needed.  Best Practice (right click and "Reselect all SmartList Selections" daily)   Diet/type: Regular consistency (see orders) DVT prophylaxis SCD Pressure ulcer(s): N/A GI prophylaxis: PPI Lines: Central line and No longer needed.  Order written to d/c  Foley:  Yes, and it is still needed Code Status:  full code Last date of multidisciplinary goals of care discussion-   Labs   CBC: Recent Labs  Lab 04/05/23 1320 04/05/23 1324 04/05/23 1720 04/05/23 1847 04/06/23 0500 04/06/23 1604 04/07/23 0258  WBC 22.3*  --   --  14.7* 15.0* 12.9* 12.7*  HGB 11.6*   < > 11.2* 12.5 12.8 12.5 11.6*  HCT 33.7*   < > 33.0* 36.8 37.8 37.7 34.8*  MCV 89.4  --   --  90.2 90.4 91.1 90.2  PLT 119*  --    --  132* 154 144* 133*   < > = values in this interval not displayed.    Basic Metabolic Panel: Recent Labs  Lab 04/03/23 1130 04/05/23 0901 04/05/23 1220 04/05/23 1324 04/05/23 1720 04/05/23 1847 04/06/23 0500 04/06/23 1604 04/07/23 0258  NA 138   < > 140   < > 142 141 138 136 136  K 4.3   < > 3.5   < > 4.4 4.0 4.1 4.2 4.0  CL 105   < > 106  --   --  111 106 102 102  CO2 24  --   --   --   --  24 23 25 26   GLUCOSE 169*   < > 132*  --   --  144* 142* 161* 151*  BUN 16   < > 12  --   --  11 9 11 9   CREATININE 0.88   < > 0.60  --   --  0.68 0.77 0.85 0.82  CALCIUM 9.2  --   --   --   --  7.4* 8.7* 8.6* 8.6*  MG  --   --   --   --   --  2.6* 2.4 2.3  --    < > = values in this interval not displayed.   GFR: Estimated Creatinine Clearance: 77.4 mL/min (by C-G formula based on SCr of 0.82 mg/dL). Recent Labs  Lab 04/05/23 1847 04/06/23 0500 04/06/23 1604 04/07/23 0258  WBC 14.7* 15.0* 12.9* 12.7*    Liver Function Tests: Recent Labs  Lab 04/03/23 1130  AST 29  ALT 40  ALKPHOS 75  BILITOT 0.7  PROT 6.9  ALBUMIN 3.6      Critical care time:       Steffanie Dunn, DO 04/07/23 2:35 PM [facility]  For contact information, see Amion. If no response to pager, please call PCCM consult pager. After hours, 7PM- 7AM, please call Elink.

## 2023-04-07 NOTE — Plan of Care (Signed)
  Problem: Nutrition: Goal: Adequate nutrition will be maintained Outcome: Progressing   Problem: Elimination: Goal: Will not experience complications related to bowel motility Outcome: Progressing   Problem: Pain Managment: Goal: General experience of comfort will improve and/or be controlled Outcome: Progressing

## 2023-04-07 NOTE — Progress Notes (Addendum)
 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 Her only complaint is difficult to expectorate sputum  Objective: Vital signs in last 24 hours: Temp:  [97.5 F (36.4 C)-99.5 F (37.5 C)] 97.7 F (36.5 C) (02/21 0727) Pulse Rate:  [86-106] 95 (02/21 0700) Cardiac Rhythm: Normal sinus rhythm (02/20 1927) Resp:  [13-26] 21 (02/21 0700) BP: (112-150)/(63-92) 134/78 (02/21 0700) SpO2:  [91 %-95 %] 94 % (02/21 0700) Arterial Line BP: (118-125)/(57-63) 118/59 (02/20 1400) FiO2 (%):  [32 %] 32 % (02/20 0844) Weight:  [90.1 kg] 90.1 kg (02/21 0500)  Filed Weights   04/05/23 0800 04/06/23 0635 04/07/23 0500  Weight: 87.5 kg 91.7 kg 90.1 kg    Weight change: 2.556 kg   Hemodynamic parameters for last 24 hours: CVP:  [4 mmHg-8 mmHg] 8 mmHg CO:  [5.9 L/min-6.1 L/min] 6.1 L/min CI:  [3 L/min/m2-3.1 L/min/m2] 3.1 L/min/m2  Intake/Output from previous day: 02/20 0701 - 02/21 0700 In: 613.5 [P.O.:477; I.V.:22.1; IV Piggyback:114.4] Out: 450 [Urine:330; Chest Tube:120]  Intake/Output this shift: No intake/output data recorded.  Current Meds: Scheduled Meds:  acetaminophen  1,000 mg Oral Q6H   Or   acetaminophen (TYLENOL) oral liquid 160 mg/5 mL  1,000 mg Per Tube Q6H   aspirin EC  325 mg Oral Daily   Or   aspirin  324 mg Per Tube Daily   bisacodyl  10 mg Oral Daily   Or   bisacodyl  10 mg Rectal Daily   Chlorhexidine Gluconate Cloth  6 each Topical Daily   docusate sodium  200 mg Oral Daily   fluticasone furoate-vilanterol  1 puff Inhalation Daily   insulin aspart  0-24 Units  Subcutaneous TID WC   insulin aspart  4 Units Subcutaneous TID WC   insulin glargine-yfgn  18 Units Subcutaneous BID   levothyroxine  112 mcg Oral QAC breakfast   loratadine  10 mg Oral Daily   metoprolol tartrate  25 mg Oral BID   Or   metoprolol tartrate  25 mg Per Tube BID   mouth rinse  15 mL Mouth Rinse Q4H   pantoprazole  40 mg Oral Daily   rosuvastatin  40 mg Oral Daily   sodium chloride flush  10-40 mL Intracatheter Q12H   sodium chloride flush  3 mL Intravenous Q12H   sodium chloride flush  3-10 mL Intravenous Q12H   sodium chloride flush  3-10 mL Intravenous Q12H   umeclidinium bromide  1 puff Inhalation Daily   venlafaxine XR  225 mg Oral Q breakfast   Continuous Infusions:  albumin human 999 mL/hr at 04/06/23 1400   PRN Meds:.albumin human, albuterol, ALPRAZolam, dextrose, metoprolol tartrate, morphine injection, ondansetron (ZOFRAN) IV, mouth rinse, oxyCODONE, sodium chloride flush, sodium chloride flush, sodium chloride flush, sodium chloride flush, traMADol  General appearance: alert, cooperative, and no distress Neurologic: intact Heart: RRR Lungs: Diminished bibasilar breath sounds Abdomen: Soft, non tender, bowel sounds present Extremities: Mild LE edema Wound: Aquacel  intact on sternal wound. RLE partially removed and wound is clean and dry.  Lab Results: CBC: Recent Labs    04/06/23 1604 04/07/23 0258  WBC 12.9* 12.7*  HGB 12.5 11.6*  HCT 37.7 34.8*  PLT 144* 133*   BMET:  Recent Labs    04/06/23 1604 04/07/23 0258  NA 136 136  K 4.2 4.0  CL 102 102  CO2 25 26  GLUCOSE 161* 151*  BUN 11 9  CREATININE 0.85 0.82  CALCIUM 8.6* 8.6*    CMET: Lab Results  Component Value Date   WBC 12.7 (H) 04/07/2023   HGB 11.6 (L) 04/07/2023   HCT 34.8 (L) 04/07/2023   PLT 133 (L) 04/07/2023   GLUCOSE 151 (H) 04/07/2023   CHOL 132 11/10/2022   TRIG 111 11/10/2022   HDL 49 11/10/2022   LDLDIRECT 157.7 03/25/2013   LDLCALC 63 11/10/2022   ALT 40  04/03/2023   AST 29 04/03/2023   NA 136 04/07/2023   K 4.0 04/07/2023   CL 102 04/07/2023   CREATININE 0.82 04/07/2023   BUN 9 04/07/2023   CO2 26 04/07/2023   TSH 1.850 11/10/2022   INR 1.4 (H) 04/05/2023   HGBA1C 6.1 (H) 04/03/2023   MICROALBUR 45.0 08/08/2022      PT/INR:  Recent Labs    04/05/23 1320  LABPROT 16.9*  INR 1.4*   Radiology: No results found.   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 On Lopressor 25 mg bid. Pulmonary-on 2 liters via [place]. Wean as able.Chest tubes with 120 cc. Remove chest tubes. CXR this am appears to show bibasilar atelectasis and small pleural effusions. Mucinex bic. Encourage incentive spirometer. Above pre op weight, LE edema-Lasix 40 mg daily Expected post op blood loss anemia-H and H this am 11.6 and 34.8 DM-CBGs 181/145/196 . On Insulin but will increase for better glucose control. Pre op HGA1C  6. Mild thrombocytopenia-platelets this am 133,00 7. History of hypothyroidism-continue Levothyroxine 8. Transfer to 4E  Ardelle Balls PA-C 04/07/2023 8:09 AM  Agree with above Floor today  Corliss Skains

## 2023-04-08 ENCOUNTER — Inpatient Hospital Stay (HOSPITAL_COMMUNITY): Payer: BC Managed Care – PPO

## 2023-04-08 LAB — CBC
HCT: 37.4 % (ref 36.0–46.0)
Hemoglobin: 12.7 g/dL (ref 12.0–15.0)
MCH: 30.1 pg (ref 26.0–34.0)
MCHC: 34 g/dL (ref 30.0–36.0)
MCV: 88.6 fL (ref 80.0–100.0)
Platelets: 152 10*3/uL (ref 150–400)
RBC: 4.22 MIL/uL (ref 3.87–5.11)
RDW: 12.8 % (ref 11.5–15.5)
WBC: 9.1 10*3/uL (ref 4.0–10.5)
nRBC: 0 % (ref 0.0–0.2)

## 2023-04-08 LAB — GLUCOSE, CAPILLARY
Glucose-Capillary: 147 mg/dL — ABNORMAL HIGH (ref 70–99)
Glucose-Capillary: 115 mg/dL — ABNORMAL HIGH (ref 70–99)
Glucose-Capillary: 130 mg/dL — ABNORMAL HIGH (ref 70–99)
Glucose-Capillary: 108 mg/dL — ABNORMAL HIGH (ref 70–99)

## 2023-04-08 LAB — BASIC METABOLIC PANEL
Anion gap: 9 (ref 5–15)
BUN: 11 mg/dL (ref 8–23)
CO2: 26 mmol/L (ref 22–32)
Calcium: 8.9 mg/dL (ref 8.9–10.3)
Chloride: 100 mmol/L (ref 98–111)
Creatinine, Ser: 0.78 mg/dL (ref 0.44–1.00)
GFR, Estimated: >60 mL/min (ref >60)
Glucose, Bld: 179 mg/dL — ABNORMAL HIGH (ref 70–99)
Potassium: 3.3 mmol/L — ABNORMAL LOW (ref 3.5–5.1)
Sodium: 135 mmol/L (ref 135–145)

## 2023-04-08 MED ORDER — METOPROLOL TARTRATE 25 MG PO TABS
25.0000 mg | ORAL_TABLET | Freq: Three times a day (TID) | ORAL | Status: DC
  Administered 2023-04-08 – 2023-04-09 (×3): 25 mg via ORAL
  Filled 2023-04-08 (×3): qty 1

## 2023-04-08 MED ORDER — ~~LOC~~ CARDIAC SURGERY, PATIENT & FAMILY EDUCATION: Freq: Once | Status: AC

## 2023-04-08 MED ORDER — METOPROLOL TARTRATE 25 MG/10 ML ORAL SUSPENSION
25.0000 mg | Freq: Three times a day (TID) | ORAL | Status: DC
  Filled 2023-04-08 (×5): qty 10

## 2023-04-08 MED ORDER — SODIUM CHLORIDE 0.9% FLUSH: 3.0000 mL | INTRAVENOUS | Status: DC | PRN

## 2023-04-08 MED ORDER — SODIUM CHLORIDE 0.9% FLUSH
3.0000 mL | Freq: Two times a day (BID) | INTRAVENOUS | Status: DC
  Administered 2023-04-08: 3 mL via INTRAVENOUS

## 2023-04-08 MED ORDER — SODIUM CHLORIDE 0.9 % IV SOLN: 250.0000 mL | INTRAVENOUS | Status: AC | PRN

## 2023-04-08 NOTE — Progress Notes (Signed)
 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 Feels she is progressing.  On RA.  No BM yet but feels like she will today.   Objective: Vital signs in last 24 hours: Temp:  [97.9 F (36.6 C)-98.8 F (37.1 C)] 98.8 F (37.1 C) (02/22 0752) Pulse Rate:  [84-187] 104 (02/22 0752) Cardiac Rhythm: Sinus tachycardia (02/21 1945) Resp:  [16-29] 16 (02/22 0508) BP: (121-147)/(76-96) 131/79 (02/22 0752) SpO2:  [91 %-100 %] 97 % (02/22 0752) Weight:  [89 kg] 89 kg (02/22 0500)  Filed Weights   04/06/23 0635 04/07/23 0500 04/08/23 0500  Weight: 91.7 kg 90.1 kg 89 kg    Weight change: -1.104 kg       Intake/Output from previous day: 02/21 0701 - 02/22 0700 In: 620 [P.O.:600; I.V.:20] Out: 1010 [Urine:1000; Chest Tube:10]  Intake/Output this shift: No intake/output data recorded.  Current Meds: Scheduled Meds:  acetaminophen  1,000 mg Oral Q6H   Or   acetaminophen (TYLENOL) oral liquid 160 mg/5 mL  1,000 mg Per Tube Q6H   aspirin EC  325 mg Oral Daily   Or   aspirin  324 mg Per Tube Daily   bisacodyl  10 mg Oral Daily   Or   bisacodyl  10 mg Rectal Daily   Chlorhexidine Gluconate Cloth  6 each Topical Daily   docusate sodium  200 mg Oral Daily   fluticasone furoate-vilanterol  1 puff Inhalation Daily   furosemide  40 mg Oral Daily   guaiFENesin  600 mg Oral BID   insulin aspart  0-24 Units Subcutaneous TID WC   insulin aspart  4 Units Subcutaneous TID WC   insulin glargine-yfgn  20 Units Subcutaneous BID   levothyroxine  112 mcg Oral QAC breakfast   loratadine   10 mg Oral Daily   metoprolol tartrate  25 mg Oral BID   Or   metoprolol tartrate  25 mg Per Tube BID   pantoprazole  40 mg Oral Daily   potassium chloride  20 mEq Oral Daily   rosuvastatin  40 mg Oral Daily   sodium chloride flush  10-40 mL Intracatheter Q12H   sodium chloride flush  3 mL Intravenous Q12H   sodium chloride flush  3 mL Intravenous Q12H   sodium chloride flush  3-10 mL Intravenous Q12H   sodium chloride flush  3-10 mL Intravenous Q12H   umeclidinium bromide  1 puff Inhalation Daily   venlafaxine XR  225 mg Oral Q breakfast   Continuous Infusions:  sodium chloride     albumin human 999 mL/hr at 04/07/23 1000   PRN Meds:.sodium chloride, albumin human, albuterol, ALPRAZolam, dextrose, metoprolol tartrate, morphine injection, ondansetron (ZOFRAN) IV, mouth rinse, oxyCODONE, sodium chloride flush, sodium chloride flush, sodium chloride flush, sodium chloride flush, sodium chloride flush, traMADol  General appearance: alert, cooperative, and no distress Neurologic: intact Heart: RRR Lungs: breath sounds are clear, normal work of breathing. Abdomen: Soft, non tender,  bowel sounds present Extremities: No edema Wound: a dry Aquacel dressing is intact on sternal wound. RLE wound is clean and dry.  Lab Results: CBC: Recent Labs    04/06/23 1604 04/07/23 0258  WBC 12.9* 12.7*  HGB 12.5 11.6*  HCT 37.7 34.8*  PLT 144* 133*   BMET:  Recent Labs    04/06/23 1604 04/07/23 0258  NA 136 136  K 4.2 4.0  CL 102 102  CO2 25 26  GLUCOSE 161* 151*  BUN 11 9  CREATININE 0.85 0.82  CALCIUM 8.6* 8.6*    CMET: Lab Results  Component Value Date   WBC 12.7 (H) 04/07/2023   HGB 11.6 (L) 04/07/2023   HCT 34.8 (L) 04/07/2023   PLT 133 (L) 04/07/2023   GLUCOSE 151 (H) 04/07/2023   CHOL 132 11/10/2022   TRIG 111 11/10/2022   HDL 49 11/10/2022   LDLDIRECT 157.7 03/25/2013   LDLCALC 63 11/10/2022   ALT 40 04/03/2023   AST 29 04/03/2023   NA 136 04/07/2023   K 4.0  04/07/2023   CL 102 04/07/2023   CREATININE 0.82 04/07/2023   BUN 9 04/07/2023   CO2 26 04/07/2023   TSH 1.850 11/10/2022   INR 1.4 (H) 04/05/2023   HGBA1C 6.1 (H) 04/03/2023   MICROALBUR 45.0 08/08/2022      PT/INR:  Recent Labs    04/05/23 1320  LABPROT 16.9*  INR 1.4*   Radiology: No results found.   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 Increase the Lopressor to  25mg  tid.  Pulmonary-room air with stable sats.  Continue Mucinex.  Continue work on pulmonary hygiene with incentive spirometer.    Renal: Normal function at baseline.  LE edema resolving.  Weight about 1.5 kg above preop.  Continue Lasix 40 mg daily  Heme: Expected post op blood loss anemia-H and H this am 11.6 and 34.8  Endo: DM type 2-CBGs stable .  Continue sliding scale insulin.  History of hypothyroidism, levothyroxine resumed   Disposition: Postop day 3 and progressing well.  Anticipate she will be ready for discharge in the next 1 to 2 days if BP and heart rhythm remain stable.  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  Leary Roca PA-C 04/08/2023 8:50 AM

## 2023-04-08 NOTE — Progress Notes (Signed)
 CARDIAC REHAB PHASE I   PRE:  Rate/Rhythm: 87 nsr  BP:  Sitting: Not taken, helped to BR      SpO2: 97 RA  MODE:  Ambulation: 360 ft    POST:  Rate/Rhythm: 122 ST  BP:  Sitting: 148/96      SpO2: 94 RA  Pt stood from bed with min assist and ambulated in the hallway w/o AD. Pt required x2 standing rest breaks d/t fatigue and returned to bed after. All needs met prior to leaving.   Katherine Congress  MS, ACSM-CEP 11:08 AM 04/08/2023    Service time is from 1045 to 1108.

## 2023-04-08 NOTE — Plan of Care (Signed)

## 2023-04-09 ENCOUNTER — Inpatient Hospital Stay (HOSPITAL_COMMUNITY): Payer: BC Managed Care – PPO

## 2023-04-09 LAB — CBC
HCT: 34.9 % — ABNORMAL LOW (ref 36.0–46.0)
Hemoglobin: 12.1 g/dL (ref 12.0–15.0)
MCH: 30.3 pg (ref 26.0–34.0)
MCHC: 34.7 g/dL (ref 30.0–36.0)
MCV: 87.5 fL (ref 80.0–100.0)
Platelets: 189 10*3/uL (ref 150–400)
RBC: 3.99 MIL/uL (ref 3.87–5.11)
RDW: 12.8 % (ref 11.5–15.5)
WBC: 8.7 10*3/uL (ref 4.0–10.5)
nRBC: 0 % (ref 0.0–0.2)

## 2023-04-09 LAB — GLUCOSE, CAPILLARY
Glucose-Capillary: 104 mg/dL — ABNORMAL HIGH (ref 70–99)
Glucose-Capillary: 153 mg/dL — ABNORMAL HIGH (ref 70–99)

## 2023-04-09 MED ORDER — LACTULOSE 10 GM/15ML PO SOLN: 20.0000 g | Freq: Once | ORAL | Status: DC | PRN

## 2023-04-09 MED ORDER — FUROSEMIDE 40 MG PO TABS: 40.0000 mg | ORAL_TABLET | Freq: Every day | ORAL | 0 refills | Status: DC

## 2023-04-09 MED ORDER — ACETAMINOPHEN 325 MG PO TABS: 650.0000 mg | ORAL_TABLET | Freq: Four times a day (QID) | ORAL | Status: DC | PRN

## 2023-04-09 MED ORDER — POTASSIUM CHLORIDE CRYS ER 20 MEQ PO TBCR: 20.0000 meq | EXTENDED_RELEASE_TABLET | Freq: Every day | ORAL | 0 refills | Status: DC

## 2023-04-09 MED ORDER — GUAIFENESIN ER 600 MG PO TB12: 600.0000 mg | ORAL_TABLET | Freq: Two times a day (BID) | ORAL | Status: AC

## 2023-04-09 MED ORDER — OXYCODONE HCL 5 MG PO TABS: 5.0000 mg | ORAL_TABLET | ORAL | 0 refills | Status: AC | PRN

## 2023-04-09 MED ORDER — ASPIRIN 325 MG PO TBEC: 325.0000 mg | DELAYED_RELEASE_TABLET | Freq: Every day | ORAL | 3 refills | Status: AC

## 2023-04-09 NOTE — Progress Notes (Signed)
 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 Feels good and would like to go home. On RA.    Objective: Vital signs in last 24 hours: Temp:  [97.5 F (36.4 C)-99 F (37.2 C)] 98 F (36.7 C) (02/23 0800) Pulse Rate:  [97-107] 100 (02/23 0800) Cardiac Rhythm: Normal sinus rhythm (02/22 1936) Resp:  [20-24] 20 (02/23 0800) BP: (127-148)/(76-96) 133/83 (02/23 0800) SpO2:  [94 %-97 %] 96 % (02/23 0800) Weight:  [88.8 kg] 88.8 kg (02/23 0409)  Filed Weights   04/07/23 0500 04/08/23 0500 04/09/23 0409  Weight: 90.1 kg 89 kg 88.8 kg    Weight change: -0.181 kg       Intake/Output from previous day: 02/22 0701 - 02/23 0700 In: -  Out: 800 [Urine:800]  Intake/Output this shift: No intake/output data recorded.  Current Meds: Scheduled Meds:  acetaminophen  1,000 mg Oral Q6H   Or   acetaminophen (TYLENOL) oral liquid 160 mg/5 mL  1,000 mg Per Tube Q6H   aspirin EC  325 mg Oral Daily   Or   aspirin  324 mg Per Tube Daily   bisacodyl  10 mg Oral Daily   Or   bisacodyl  10 mg Rectal Daily   Chlorhexidine Gluconate Cloth  6 each Topical Daily   docusate sodium  200 mg Oral Daily   fluticasone furoate-vilanterol  1 puff Inhalation Daily   furosemide  40 mg Oral Daily   guaiFENesin  600 mg Oral BID   insulin aspart  0-24 Units Subcutaneous TID WC   insulin aspart  4 Units Subcutaneous TID WC   insulin glargine-yfgn  20 Units Subcutaneous BID   levothyroxine  112 mcg Oral QAC breakfast   loratadine  10 mg Oral Daily   metoprolol tartrate  25 mg Oral TID   Or    metoprolol tartrate  25 mg Per Tube TID   pantoprazole  40 mg Oral Daily   potassium chloride  20 mEq Oral Daily   rosuvastatin  40 mg Oral Daily   sodium chloride flush  10-40 mL Intracatheter Q12H   sodium chloride flush  3 mL Intravenous Q12H   sodium chloride flush  3 mL Intravenous Q12H   sodium chloride flush  3-10 mL Intravenous Q12H   sodium chloride flush  3-10 mL Intravenous Q12H   umeclidinium bromide  1 puff Inhalation Daily   venlafaxine XR  225 mg Oral Q breakfast   Continuous Infusions:  albumin human 999 mL/hr at 04/07/23 1000   PRN Meds:.albumin human, albuterol, ALPRAZolam, dextrose, metoprolol tartrate, morphine injection, ondansetron (ZOFRAN) IV, mouth rinse, oxyCODONE, sodium chloride flush, sodium chloride flush, sodium chloride flush, sodium chloride flush, sodium chloride flush, traMADol  General appearance: alert, cooperative, and no distress Neurologic: intact Heart: RRR, no arrhythmias on monitor reviewed Lungs: breath sounds are clear, normal work of breathing. Abdomen: Soft, non tender, bowel sounds present Extremities: No edema Wound: Sternotomy incision is intact  and dry. RLE wound is clean and dry.  Lab Results: CBC: Recent Labs    04/08/23 0929 04/09/23 0258  WBC 9.1 8.7  HGB 12.7 12.1  HCT 37.4 34.9*  PLT 152 189   BMET:  Recent Labs    04/07/23 0258 04/08/23 0929  NA 136 135  K 4.0 3.3*  CL 102 100  CO2 26 26  GLUCOSE 151* 179*  BUN 9 11  CREATININE 0.82 0.78  CALCIUM 8.6* 8.9    CMET: Lab Results  Component Value Date   WBC 8.7 04/09/2023   HGB 12.1 04/09/2023   HCT 34.9 (L) 04/09/2023   PLT 189 04/09/2023   GLUCOSE 179 (H) 04/08/2023   CHOL 132 11/10/2022   TRIG 111 11/10/2022   HDL 49 11/10/2022   LDLDIRECT 157.7 03/25/2013   LDLCALC 63 11/10/2022   ALT 40 04/03/2023   AST 29 04/03/2023   NA 135 04/08/2023   K 3.3 (L) 04/08/2023   CL 100 04/08/2023   CREATININE 0.78 04/08/2023   BUN 11 04/08/2023   CO2 26  04/08/2023   TSH 1.850 11/10/2022   INR 1.4 (H) 04/05/2023   HGBA1C 6.1 (H) 04/03/2023   MICROALBUR 45.0 08/08/2022      PT/INR:  No results for input(s): "LABPROT", "INR" in the last 72 hours.  Radiology: DG Chest 2 View Result Date: 04/09/2023 CLINICAL DATA:  Postoperative status. EXAM: CHEST - 2 VIEW COMPARISON:  April 08, 2023. FINDINGS: Stable cardiomediastinal silhouette. Status post coronary artery bypass graft. Hypoinflation of the lungs is noted. Mild left basilar atelectasis is noted with small left pleural effusion. Right lung is clear. Bony thorax is unremarkable. IMPRESSION: Hypoinflation of the lungs. Mild left basilar atelectasis is noted with small left pleural effusion. Electronically Signed   By: Lupita Raider M.D.   On: 04/09/2023 08:24     Assessment/Plan: S/P Procedure(s) (LRB): OFF PUMP CORONARY ARTERY BYPASS GRAFTING (CABG) TIMES THREE  USING LEFT INTERNAL MAMMARY ARTERY  AND ENDOSCOPICALLY HARVESTED RIGHT GREATER SAPHENOUS VEIN (N/A) TRANSESOPHAGEAL ECHOCARDIOGRAM (TEE) (N/A)  Postop day 4 off-pump CABG x 3  CV- Stable SR HR 85-105 and BP 110-130's. Increase the Lopressor to  50mg  bid.  Pulmonary-room air with stable sats.  Continue Mucinex.  Encouraged to continue  work on pulmonary hygiene with incentive spirometer.    Renal: Normal function at baseline.  LE edema resolved.  Weight about 1.5 kg above preop.  Continue Lasix 40 mg daily for a few days after discharge  Heme: Expected post op blood loss anemia-H ct stable  Endo: DM type 2-CBGs stable .  Continue sliding scale insulin.  History of hypothyroidism, levothyroxine resumed   Disposition: Postop day 4 and progressing well.  Plan for discharge today.  Instructions given and follow up arranged.    Leary Roca PA-C 04/09/2023 8:53 AM

## 2023-04-10 ENCOUNTER — Telehealth: Payer: Self-pay

## 2023-04-10 MED FILL — Potassium Chloride Inj 2 mEq/ML: INTRAVENOUS | Qty: 40 | Status: AC

## 2023-04-10 MED FILL — Heparin Sodium (Porcine) Inj 1000 Unit/ML: Qty: 1000 | Status: AC

## 2023-04-10 MED FILL — Lidocaine HCl Local Preservative Free (PF) Inj 2%: INTRAMUSCULAR | Qty: 14 | Status: AC

## 2023-04-10 NOTE — Transitions of Care (Post Inpatient/ED Visit) (Signed)
 04/10/2023  Name: [name] MRN: [id number] DOB: Mar 12, 1960  Today's TOC FU Call Status: Today's TOC FU Call Status:: Successful TOC FU Call Completed TOC FU Call Complete Date: 04/10/23 Patient's Name and Date of Birth confirmed.  Transition Care Management Follow-up Telephone Call Date of Discharge: 04/09/23 Discharge Facility: [facility]) Type of Discharge: Inpatient Admission Primary Inpatient Discharge Diagnosis:: CABG X 3 How have you been since you were released from the hospital?: Better Any questions or concerns?: No  Items Reviewed: Did you receive and understand the discharge instructions provided?: Yes Medications obtained,verified, and reconciled?: Yes (Medications Reviewed) Any new allergies since your discharge?: No Dietary orders reviewed?: NA Do you have support at home?: Yes People in Home: spouse Name of Support/Comfort Primary Source: Royetta Crochet, spouse  Medications Reviewed Today: Medications Reviewed Today     Reviewed by Redge Gainer, RN (Case Manager) on 04/10/23 at 1441  Med List Status: <None>   Medication Order Taking? Sig Documenting Provider Last Dose Status Informant  acetaminophen (TYLENOL) 325 MG tablet [id number]  Take 2 tablets (650 mg total) by mouth every 6 (six) hours as needed. Leary Roca, PA-C  Active   Albuterol-Budesonide (AIRSUPRA) 90-80 MCG/ACT AERO [id number] No Inhale 1-2 Inhalations into the lungs every 6 (six) hours as needed for wheezing, cough, shortness of breath. Tollie Eth, NP Past Month Active Self  ALPRAZolam Prudy Feeler) 0.5 MG tablet [id number] No Take 0.5-1 tablets (0.25-0.5 mg total) by mouth 2 (two) times daily as needed for anxiety. Tollie Eth, NP Past Week Active Self  aspirin EC 325 MG tablet [id number]  Take 1 tablet (325 mg total) by mouth daily. Leary Roca, PA-C  Active   B Complex Vitamins (VITAMIN B COMPLEX PO) [id number] No Take 1 tablet by mouth daily. [provider]  Taking Active Self           Med Note Tiburcio Pea, Jacqulyn Bath T   Tue Mar 28, 2023  2:02 PM) On hold per patient preference  cetirizine (ZYRTEC) 10 MG tablet [id number] No Take 1 tablet (10 mg total) by mouth daily. Ashok Croon, MD Past Week Active Self  Continuous Glucose Receiver Vira Agar G7 [place]) DEVI [id number] No For use with Dexcom G7 sensor Early, Sung Amabile, NP Taking Active Self  Continuous Glucose Sensor (DEXCOM G7 SENSOR) MISC [id number] No Apply new sensor every 10 days for continuous glucose monitoring. Tollie Eth, NP Taking Active Self  fluticasone (FLONASE) 50 MCG/ACT nasal spray [id number] No Place 2 sprays into both nostrils 2 (two) times daily.  Patient taking differently: Place 2 sprays into both nostrils 2 (two) times daily as needed for allergies.   Ashok Croon, MD 04/05/2023  7:00 AM Active Self  fluticasone furoate-vilanterol (BREO ELLIPTA) 100-25 MCG/ACT AEPB [id number] No Inhale 1 puff into the lungs daily. Tollie Eth, NP 04/05/2023  7:00 AM Active Self  furosemide (LASIX) 40 MG tablet [id number]  Take 1 tablet (40 mg total) by mouth daily for 5 days. Leary Roca, PA-C  Active   guaiFENesin (MUCINEX) 600 MG 12 hr tablet [id number]  Take 1 tablet (600 mg total) by mouth 2 (two) times daily. Leary Roca, PA-C  Active   levothyroxine (SYNTHROID) 112 MCG tablet [id number] No Take 1 tablet (112 mcg total) by mouth daily. Tollie Eth, NP 04/05/2023  7:00 AM Active Self  Menthol, Topical Analgesic, (ICY HOT EX) [id number] No Apply 1 application topically daily as needed (pain). [provider] Taking Active Self  metoprolol succinate (TOPROL-XL) 100 MG 24 hr tablet [id number] No Take 1 tablet (100 mg total) by mouth daily. Take with or immediately following a meal. Early, Sung Amabile, NP 04/05/2023  7:00 AM Active Self  nitroGLYCERIN (NITROSTAT) 0.4 MG SL tablet [id number] No Place 1 tablet (0.4 mg total) under the tongue every 5 (five) minutes as needed for chest  pain. Max three doses in a row. Proceed to ED for evaluation if no improvement. Tollie Eth, NP 04/01/2023 Active Self  oxyCODONE (OXY IR/ROXICODONE) 5 MG immediate release tablet [id number]  Take 1 tablet (5 mg total) by mouth every 3 (three) hours as needed for up to 7 days for severe pain (pain score 7-10). Leary Roca, PA-C  Active   pantoprazole (PROTONIX) 40 MG tablet [id number] No Take 1 tablet (40 mg total) by mouth daily. Tollie Eth, NP 04/05/2023  7:00 AM Active Self  potassium chloride SA (KLOR-CON M) 20 MEQ tablet [id number]  Take 1 tablet (20 mEq total) by mouth daily for 5 days. Leary Roca, PA-C  Active   Probiotic Product (PROBIOTIC PO) [id number] No Take 1 capsule by mouth in the morning. [provider] Past Week Active Self  rosuvastatin (CRESTOR) 40 MG tablet [id number] No Take 1 tablet (40 mg total) by mouth daily. Tollie Eth, NP 04/05/2023  7:00 AM Active Self  Tiotropium Bromide Monohydrate (SPIRIVA RESPIMAT) 2.5 MCG/ACT AERS [id number] No Inhale 2 puffs into the lungs daily. Tollie Eth, NP 04/05/2023  7:00 AM Active Self           Med Note Tiburcio Pea, YOLINDA T   Tue Mar 28, 2023  2:02 PM)    tirzepatide [facility]) 12.5 MG/0.5ML Pen [id number] No Inject 12.5 mg into the skin once a week.  Patient taking differently: Inject 12.5 mg into the skin every Friday.   Tollie Eth, NP 03/24/2023 Active Self  venlafaxine XR (EFFEXOR-XR) 75 MG 24 hr capsule [id number] No Take 3 capsules (225 mg total) by mouth daily with breakfast. Early, Sung Amabile, NP 04/05/2023  7:00 AM Active Self  Vitamin D, Ergocalciferol, (DRISDOL) 1.25 MG (50000 UNIT) CAPS capsule [id number] No Take 1 capsule (50,000 Units total) by mouth every 7 (seven) days.  Patient taking differently: Take 50,000 Units by mouth every Sunday.   Tollie Eth, NP Past Week Active Self            Home Care and Equipment/Supplies: Were Home Health Services Ordered?: NA Any new equipment or medical  supplies ordered?: NA  Functional Questionnaire: Do you need assistance with bathing/showering or dressing?: No Do you need assistance with meal preparation?: No Do you need assistance with eating?: No Do you have difficulty maintaining continence: No Do you need assistance with getting out of bed/getting out of a chair/moving?: No Do you have difficulty managing or taking your medications?: No  Follow up appointments reviewed: PCP Follow-up appointment confirmed?: NA Specialist Hospital Follow-up appointment confirmed?: Yes Date of Specialist follow-up appointment?: 04/25/23 Follow-Up Specialty Provider:: Micah Flesher Do you need transportation to your follow-up appointment?: No Do you understand care options if your condition(s) worsen?: Yes-patient verbalized understanding  SDOH Interventions Today    Flowsheet Row Most Recent Value  SDOH Interventions   Food Insecurity Interventions Intervention Not Indicated  Housing Interventions Intervention Not Indicated  Transportation Interventions Intervention Not Indicated  Utilities Interventions Intervention Not Indicated      Interventions Today    Flowsheet Row Most Recent Value  Chronic Disease  Chronic disease during today's visit Hypertension (HTN)  General Interventions   General Interventions Discussed/Reviewed General Interventions Discussed, General Interventions Reviewed  Pharmacy Interventions   Pharmacy Dicussed/Reviewed Medications and their functions      The patient has been provided with contact information for the care management team and has been advised to call with any health-related questions or concerns. The patient verbalized understanding with current POC. The patient is directed to their insurance card regarding availability of benefits coverage.  Deidre Ala, BSN, RN [place]  [facility] - [employer] [facility] RN Care Manager [phone]

## 2023-04-11 NOTE — Addendum Note (Signed)
 Addendum  created 04/11/23 1314 by Dorie Rank, CRNA   Intraprocedure Meds edited

## 2023-04-14 ENCOUNTER — Ambulatory Visit: Payer: Self-pay | Admitting: Thoracic Surgery (Cardiothoracic Vascular Surgery)

## 2023-04-14 DIAGNOSIS — Z951 Presence of aortocoronary bypass graft: Secondary | ICD-10-CM

## 2023-04-14 NOTE — Progress Notes (Signed)
     [address]       [place] [address]             [phone]       Patient: Home Provider: Office Consent for Telemedicine visit obtained.  Today's visit was completed via a real-time telehealth (see specific modality noted below). The patient/authorized person provided oral consent at the time of the visit to engage in a telemedicine encounter with the present provider at [facility]. The patient/authorized person was informed of the potential benefits, limitations, and risks of telemedicine. The patient/authorized person expressed understanding that the laws that protect confidentiality also apply to telemedicine. The patient/authorized person acknowledged understanding that telemedicine does not provide emergency services and that he or she would need to call 911 or proceed to the nearest hospital for help if such a need arose.   Total time spent in the clinical discussion 10 minutes.  Telehealth Modality: Phone visit (audio only)  I had a telephone visit with  [name] who is s/p CABG.  Overall doing well.  Pain is minimal.  Ambulating well. Vitals have been stable.  [name] will see [place] back in 1 month with a chest x-ray for cardiac rehab clearance.  Bowie Doiron Keane Scrape

## 2023-04-14 NOTE — Progress Notes (Signed)
 Cardiology Office Note:    Date:  04/25/2023   ID:  [name], DOB 03-Mar-1960, MRN [id number]  PCP:  Tollie Eth, NP   [facility] Providers Cardiologist:  Nanetta Batty, MD     Referring MD: Tollie Eth, NP   Chief Complaint  Patient presents with   Follow-up    Post CABG    History of Present Illness:    [name] is a 63 y.o. female with a hx of CAD s/p CABG x 3 03/2023, DM, hypothyroidism, hyperlipidemia, HTN, prior tobacco abuse, and obesity.  She has a strong family history of heart disease as her father died at age 3 of an MI.  She has a history of PAD and is status post aortobifem bypass by Dr. Madilyn Fireman in 2007.  She developed chest pain and was seen OP and underwent outpatient LHC which showed severe three-vessel disease.  She underwent off-pump CABG x 3 with LIMA-LAD, SVG-PDA, SVG-diagonal 03/2023.  Her postop course was uneventful.  She presents back today for cardiology follow-up. She continues to have rib soreness following off-pump CABG. She feels better with exertional activities. She is having SOB, but did not take her asthma medicine.   She is stressed as her husband is currently hospitalized with his third stroke. She is taking care of her grandson as his parents are separating - noticing she can take care of grandson without SOB or chest pain.    Past Medical History:  Diagnosis Date   Abdominal bloating 11/05/2020   Abnormality of right breast on screening mammogram 10/14/2019   Needs further views of right breast.  Breast center to call.       Achilles tendon contracture, right    Acute bilateral low back pain without sciatica 08/04/2006   Qualifier: Diagnosis of   By: Marcelyn Ditty, RN, Katy Fitch        Acute non-recurrent frontal sinusitis 12/24/2019   Acute recurrent pansinusitis 08/04/2020   ALLERGIC RHINITIS 04/10/2008   Allergic rhinitis 04/10/2008   Qualifier: Diagnosis of  By: Amador Cunas  MD, Janett Labella    Anxiety    Arthritis    Asthma     asthmatic bronchitis   Breast lump in female    Carpal tunnel syndrome of right wrist    Cellulitis 10/31/2019   Chronic right shoulder pain 05/07/2019   Complication of anesthesia    trouble waking up after tubal ligation   Coronary artery disease    DEPRESSION 08/13/2008   Diabetes mellitus without complication (HCC)    Fatigue 11/17/2015   Fatty liver disease, nonalcoholic 03/29/2013   GERD (gastroesophageal reflux disease)    Headache    Occasional Migraines   HYPERLIPIDEMIA 08/04/2006   HYPERTENSION 08/04/2006   HYPOTHYROIDISM 08/04/2006   Insomnia 10/31/2019   Kidney injury    LOW BACK PAIN 08/04/2006   Moderate persistent asthma with (acute) exacerbation 06/27/2019   Neuromuscular disorder (HCC)    Pain of finger of right hand 02/09/2021   Patellofemoral syndrome, right 06/19/2019   PERIMENOPAUSAL SYNDROME 05/08/2008   Plantar fasciitis of right foot 04/12/2017   Plantar fasciitis, left 11/17/2015   Pneumonia    Postmenopausal bleeding 07/17/2019   Biopsy and [place] c/w endometrial polyp.  Pt to see Dr. Earlene Plater for surgical consult.     Primary insomnia 10/31/2019   PVD (peripheral vascular disease) (HCC)    occlusive, status post bifem bypass 2007   SOB (shortness of breath) 07/12/2016   Vaginal candidiasis 10/31/2019  Past Surgical History:  Procedure Laterality Date   APPENDECTOMY  1985   CARPAL TUNNEL RELEASE Right 04/05/2016   Procedure: OPEN RIGHT CARPAL TUNNEL RELEASE;  Surgeon: Kerrin Champagne, MD;  Location: [facility];  Service: Orthopedics;  Laterality: Right;   CHOLECYSTECTOMY  1985   CORONARY ARTERY BYPASS GRAFT N/A 04/05/2023   Procedure: OFF PUMP CORONARY ARTERY BYPASS GRAFTING (CABG) TIMES THREE  USING LEFT INTERNAL MAMMARY ARTERY  AND ENDOSCOPICALLY HARVESTED RIGHT GREATER SAPHENOUS VEIN;  Surgeon: Corliss Skains, MD;  Location: [facility] OR;  Service: Open Heart Surgery;  Laterality: N/A;   FEMORAL BYPASS     bifem.   HAGLAND'S  DEFORMITY EXCISION Left    LEFT HEART CATH AND CORONARY ANGIOGRAPHY N/A 03/06/2023   Procedure: LEFT HEART CATH AND CORONARY ANGIOGRAPHY;  Surgeon: Yvonne Kendall, MD;  Location: [facility] IN[facility];  Service: Cardiovascular;  Laterality: N/A;   PLANTAR FASCIA RELEASE Right 04/18/2018   Procedure: PLANTAR FASCIA RELEASE AND GASTROCNEMIUS RECESSION RIGHT;  Surgeon: Nadara Mustard, MD;  Location: [facility] OR;  Service: Orthopedics;  Laterality: Right;   TEE WITHOUT CARDIOVERSION N/A 04/05/2023   Procedure: TRANSESOPHAGEAL ECHOCARDIOGRAM (TEE);  Surgeon: Corliss Skains, MD;  Location: [facility] OR;  Service: Open Heart Surgery;  Laterality: N/A;   TUBAL LIGATION  1996    Current Medications: Current Meds  Medication Sig   Albuterol-Budesonide (AIRSUPRA) 90-80 MCG/ACT AERO Inhale 1-2 Inhalations into the lungs every 6 (six) hours as needed for wheezing, cough, shortness of breath.   ALPRAZolam (XANAX) 0.5 MG tablet Take 0.5-1 tablets (0.25-0.5 mg total) by mouth 2 (two) times daily as needed for anxiety.   aspirin EC 325 MG tablet Take 1 tablet (325 mg total) by mouth daily.   B Complex Vitamins (VITAMIN B COMPLEX PO) Take 1 tablet by mouth daily.   cetirizine (ZYRTEC) 10 MG tablet Take 1 tablet (10 mg total) by mouth daily.   Continuous Glucose Receiver (DEXCOM G7 RECEIVER) DEVI For use with Dexcom G7 sensor   Continuous Glucose Sensor (DEXCOM G7 SENSOR) MISC Apply new sensor every 10 days for continuous glucose monitoring.   fluticasone (FLONASE) 50 MCG/ACT nasal spray Place 2 sprays into both nostrils 2 (two) times daily. (Patient taking differently: Place 2 sprays into both nostrils 2 (two) times daily as needed for allergies.)   fluticasone furoate-vilanterol (BREO ELLIPTA) 100-25 MCG/ACT AEPB Inhale 1 puff into the lungs daily.   levothyroxine (SYNTHROID) 112 MCG tablet Take 1 tablet (112 mcg total) by mouth daily.   lidocaine 4 % Place 1 patch onto the skin daily. Please apply to either side of  chest   Menthol, Topical Analgesic, (ICY HOT EX) Apply 1 application topically daily as needed (pain).   metoprolol succinate (TOPROL-XL) 100 MG 24 hr tablet Take 1 tablet (100 mg total) by mouth daily. Take with or immediately following a meal.   nitroGLYCERIN (NITROSTAT) 0.4 MG SL tablet Place 1 tablet (0.4 mg total) under the tongue every 5 (five) minutes as needed for chest pain. Max three doses in a row. Proceed to ED for evaluation if no improvement.   oxyCODONE (OXY IR/ROXICODONE) 5 MG immediate release tablet Take 1 tablet (5 mg total) by mouth every 6 (six) hours as needed for severe pain (pain score 7-10).   pantoprazole (PROTONIX) 40 MG tablet Take 1 tablet (40 mg total) by mouth daily.   Probiotic Product (PROBIOTIC PO) Take 1 capsule by mouth in the morning.   rosuvastatin (CRESTOR) 40 MG tablet  Take 1 tablet (40 mg total) by mouth daily.   Tiotropium Bromide Monohydrate (SPIRIVA RESPIMAT) 2.5 MCG/ACT AERS Inhale 2 puffs into the lungs daily.   tirzepatide (MOUNJARO) 12.5 MG/0.5ML Pen Inject 12.5 mg into the skin once a week. (Patient taking differently: Inject 12.5 mg into the skin every Friday.)   venlafaxine XR (EFFEXOR-XR) 75 MG 24 hr capsule Take 3 capsules (225 mg total) by mouth daily with breakfast.   Vitamin D, Ergocalciferol, (DRISDOL) 1.25 MG (50000 UNIT) CAPS capsule Take 1 capsule (50,000 Units total) by mouth every 7 (seven) days. (Patient taking differently: Take 50,000 Units by mouth every Sunday.)     Allergies:   Influenza virus vacc split pf, Gluten meal, and Lipitor [atorvastatin]   Social History   Socioeconomic History   Marital status: Married    Spouse name: Not on file   Number of children: 3   Years of education: Not on file   Highest education level: Not on file  Occupational History   Not on file  Tobacco Use   Smoking status: Former    Current packs/day: 0.00    Types: Cigarettes    Quit date: 02/15/2004    Years since quitting: 19.2   Smokeless  tobacco: Never  Vaping Use   Vaping status: Never Used  Substance and Sexual Activity   Alcohol use: Yes    Comment: Rarely   Drug use: No   Sexual activity: Not Currently  Other Topics Concern   Not on file  Social History Narrative   Not on file   Social Drivers of Health   Financial Resource Strain: Not on file  Food Insecurity: No Food Insecurity (04/10/2023)   Hunger Vital Sign    Worried About Running Out of Food in the Last Year: Never true    Ran Out of Food in the Last Year: Never true  Transportation Needs: No Transportation Needs (04/10/2023)   PRAPARE - Administrator, Civil Service (Medical): No    Lack of Transportation (Non-Medical): No  Physical Activity: Not on file  Stress: Not on file  Social Connections: Unknown (06/28/2021)   [facility], [facility]   Social Network    Social Network: Not on file     Family History: The patient's family history includes Alcohol abuse in her brother and maternal uncle; Diabetes in her maternal grandmother; Heart disease in her father; Hyperlipidemia in her father; Hypertension in her father and mother; Ovarian cancer in her daughter; Stroke in her maternal uncle.  ROS:   Please see the history of present illness.     All other systems reviewed and are negative.  EKGs/Labs/Other Studies Reviewed:    The following studies were reviewed today:  Cardiac Studies & Procedures   ______________________________________________________________________________________________ CARDIAC CATHETERIZATION  CARDIAC CATHETERIZATION 03/06/2023  Narrative Conclusions: Multivessel coronary artery disease, as detailed below, with sequential proximal and mid LAD stenoses of up to 80-90%, sequential 90-99% mid/distal LCx lesions, and sequential 60-70% proximal and mid RCA stenoses. Normal left ventricular systolic function (LVEF 55-65%). Mildly elevated left ventricular filling pressure (LVEDP 18  mmHg).  Recommendations: Cardiac surgery consultation for CABG.  Patient advised to seek immediate medical attention for any recurrent angina that does not resolve promptly with sublingual nitroglycerin pending surgical evaluation. Add isosorbide mononitrate 15 mg daily for antianginal therapy. Continue aggressive secondary prevention of coronary artery disease.  Yvonne Kendall, MD [facility]  Findings Coronary Findings Diagnostic  Dominance: Right  Left Main Vessel is  large. Ost LM lesion is 30% stenosed. Dist LM to Ost LAD lesion is 30% stenosed.  Left Anterior Descending Prox LAD lesion is 85% stenosed. Mid LAD lesion is 75% stenosed.  First Diagonal Branch Vessel is large in size. There is moderate disease in the vessel.  Second Diagonal Branch Vessel is moderate in size.  Left Circumflex Vessel is moderate in size. Mid Cx lesion is 90% stenosed. The lesion is eccentric. Dist Cx lesion is 99% stenosed.  First Obtuse Marginal Branch Vessel is small in size.  Second Obtuse Marginal Branch Vessel is small in size. 2nd Mrg lesion is 70% stenosed.  Third Obtuse Marginal Branch Vessel is moderate in size.  Right Coronary Artery Vessel is large. The vessel is severely calcified. Prox RCA lesion is 70% stenosed. The lesion is severely calcified. Mid RCA lesion is 60% stenosed. The lesion is irregular. The lesion is moderately calcified.  Right Posterior Descending Artery Vessel is large in size. RPDA lesion is 55% stenosed.  First Right Posterolateral Branch Vessel is moderate in size. 1st RPL lesion is 70% stenosed.  Second Right Posterolateral Branch Vessel is small in size.  Intervention  No interventions have been documented.   STRESS TESTS  MYOCARDIAL PERFUSION IMAGING 10/01/2019  Narrative  Nuclear stress EF: 71%.  There was no ST segment deviation noted during stress.  The study is normal.  This is a low risk study.  The left  ventricular ejection fraction is hyperdynamic (>65%).  No focal wall motion abnormalities.   ECHOCARDIOGRAM  ECHOCARDIOGRAM COMPLETE 02/28/2023  Narrative ECHOCARDIOGRAM REPORT    Patient Name:   GERTHA LICHTENBERG Marmol Date of Exam: 02/28/2023 Medical Rec #:  [id number]    Height:       64.0 in Accession #:    [id number]   Weight:       198.6 lb Date of Birth:  02/01/1961    BSA:          1.951 m Patient Age:    62 years     BP:           120/72 mmHg Patient Gender: F            HR:           70 bpm. Exam Location:  Outpatient  Procedure: 2D Echo, 3D Echo, Cardiac Doppler, Color Doppler and Strain Analysis  Indications:    Atypical chest pain  History:        Patient has no prior history of Echocardiogram examinations. Risk Factors:Hypertension, Former Smoker, Dyslipidemia and Diabetes. Post aortobifemoral bypass grafting by Dr. Madilyn Fireman in 2007.  Sonographer:    Jeryl Columbia RDCS Referring Phys: [phone] JONATHAN J BERRY  IMPRESSIONS   1. Left ventricular ejection fraction, by estimation, is 65 to 70%. The left ventricle has normal function. The left ventricle has no regional wall motion abnormalities. There is mild concentric left ventricular hypertrophy. Left ventricular diastolic parameters are consistent with Grade I diastolic dysfunction (impaired relaxation). The average left ventricular global longitudinal strain is -14.8 %. The global longitudinal strain is abnormal. 2. Right ventricular systolic function is normal. The right ventricular size is normal. 3. The mitral valve is normal in structure. No evidence of mitral valve regurgitation. No evidence of mitral stenosis. 4. The aortic valve is tricuspid. Aortic valve regurgitation is not visualized. No aortic stenosis is present. 5. The inferior vena cava is normal in size with greater than 50% respiratory variability, suggesting right atrial pressure of 3 mmHg.  FINDINGS Left  Ventricle: Left ventricular ejection fraction, by  estimation, is 65 to 70%. The left ventricle has normal function. The left ventricle has no regional wall motion abnormalities. The average left ventricular global longitudinal strain is -14.8 %. The global longitudinal strain is abnormal. The left ventricular internal cavity size was normal in size. There is mild concentric left ventricular hypertrophy. Left ventricular diastolic parameters are consistent with Grade I diastolic dysfunction (impaired relaxation).  Right Ventricle: The right ventricular size is normal. No increase in right ventricular wall thickness. Right ventricular systolic function is normal.  Left Atrium: Left atrial size was normal in size.  Right Atrium: Right atrial size was normal in size.  Pericardium: There is no evidence of pericardial effusion.  Mitral Valve: The mitral valve is normal in structure. No evidence of mitral valve regurgitation. No evidence of mitral valve stenosis.  Tricuspid Valve: The tricuspid valve is normal in structure. Tricuspid valve regurgitation is not demonstrated. No evidence of tricuspid stenosis.  Aortic Valve: The aortic valve is tricuspid. Aortic valve regurgitation is not visualized. No aortic stenosis is present.  Pulmonic Valve: The pulmonic valve was normal in structure. Pulmonic valve regurgitation is trivial. No evidence of pulmonic stenosis.  Aorta: The aortic root is normal in size and structure.  Venous: The inferior vena cava is normal in size with greater than 50% respiratory variability, suggesting right atrial pressure of 3 mmHg.  IAS/Shunts: No atrial level shunt detected by color flow Doppler.   LEFT VENTRICLE PLAX 2D LVIDd:         4.10 cm   Diastology LVIDs:         1.95 cm   LV e' medial:    6.09 cm/s LV PW:         1.18 cm   LV E/e' medial:  8.9 LV IVS:        1.09 cm   LV e' lateral:   8.16 cm/s LVOT diam:     1.90 cm   LV E/e' lateral: 6.6 LV SV:         44 LV SV Index:   22        2D Longitudinal  Strain LVOT Area:     2.84 cm  2D Strain GLS (A2C):   -14.7 % 2D Strain GLS (A3C):   -14.7 % 2D Strain GLS (A4C):   -15.0 % 2D Strain GLS Avg:     -14.8 %  3D Volume EF: 3D EF:        46 % LV EDV:       81 ml LV ESV:       44 ml LV SV:        37 ml  RIGHT VENTRICLE RV Basal diam:  3.98 cm RV Mid diam:    3.06 cm RV S prime:     9.25 cm/s TAPSE (M-mode): 2.0 cm  LEFT ATRIUM             Index        RIGHT ATRIUM           Index LA diam:        2.40 cm 1.23 cm/m   RA Area:     12.40 cm LA Vol (A2C):   22.8 ml 11.69 ml/m  RA Volume:   28.60 ml  14.66 ml/m LA Vol (A4C):   15.3 ml 7.84 ml/m LA Biplane Vol: 20.1 ml 10.30 ml/m AORTIC VALVE LVOT Vmax:   82.30 cm/s LVOT Vmean:  52.900 cm/s LVOT  VTI:    0.154 m  AORTA Ao Root diam: 2.70 cm Ao Asc diam:  3.10 cm  MITRAL VALVE MV Area (PHT): 3.42 cm    SHUNTS MV Decel Time: 222 msec    Systemic VTI:  0.15 m MV E velocity: 54.00 cm/s  Systemic Diam: 1.90 cm MV A velocity: 73.40 cm/s MV E/A ratio:  0.74  Chilton Si MD Electronically signed by Chilton Si MD Signature Date/Time: 02/28/2023/3:45:27 PM    Final   TEE  ECHO INTRAOPERATIVE TEE 04/05/2023  Narrative *INTRAOPERATIVE TRANSESOPHAGEAL REPORT *    Patient Name:   Suezanne Jacquet Levandowski   Date of Exam: 04/05/2023 Medical Rec #:  [id number]      Height:       64.0 in Accession #:    [id number]     Weight:       195.4 lb Date of Birth:  09-Mar-1960      BSA:          1.94 m Patient Age:    62 years       BP:           133/88 mmHg Patient Gender: F              HR:           76 bpm. Exam Location:  Anesthesiology  Transesophogeal exam was perform intraoperatively during surgical procedure. Patient was closely monitored under general anesthesia during the entirety of examination.  Indications:     I25.110 Atherosclerotic heart disease of native coronary artery with unstable angina pectoris Performing Phys: [id number] HARRELL O LIGHTFOOT  Complications: No  known complications during this procedure. PRE-OP FINDINGS Left Ventricle: The left ventricle has normal systolic function, with an ejection fraction of 60-65%. The cavity size was normal. No evidence of left ventricular regional wall motion abnormalities. There is no left ventricular hypertrophy.   Right Ventricle: The right ventricle has normal systolic function. The cavity was normal. There is no increase in right ventricular wall thickness.  Left Atrium: Left atrial size was not assessed. No left atrial/left atrial appendage thrombus was detected. Left atrial appendage velocity is normal at greater than 40 cm/s.  Right Atrium: Right atrial size was not assessed.  Interatrial Septum: No atrial level shunt detected by color flow Doppler. There is no evidence of a patent foramen ovale.  Pericardium: There is no evidence of pericardial effusion.  Mitral Valve: The mitral valve is normal in structure. Mitral valve regurgitation is trivial by color flow Doppler. The MR jet is centrally-directed. Pulmonary venous flow is normal. There is No evidence of mitral stenosis.  Tricuspid Valve: The tricuspid valve was normal in structure. Tricuspid valve regurgitation is trivial by color flow Doppler. The jet is directed centrally. No evidence of tricuspid stenosis is present.  Aortic Valve: The aortic valve is tricuspid Aortic valve regurgitation is trivial by color flow Doppler. The jet is centrally-directed. There is no stenosis of the aortic valve, with a calculated valve area of 1.87 cm.   Pulmonic Valve: The pulmonic valve was normal in structure, with normal. No evidence of pumonic stenosis. Pulmonic valve regurgitation is not visualized by color flow Doppler.   Aorta: There is evidence of plaque in the descending aorta; Grade I, measuring 1-3mm in size. There is evidence of a dissection in the none.  Shunts: There is no evidence of an atrial septal  defect.  +--------------+--------++ LEFT VENTRICLE         +--------------+--------++ PLAX 2D                +--------------+--------++  LVOT diam:    1.90 cm  +--------------+--------++ LVOT Area:    2.84 cm +--------------+--------++                        +--------------+--------++  +------------------+------------++ AORTIC VALVE                   +------------------+------------++ AV Area (Vmax):   1.64 cm     +------------------+------------++ AV Area (Vmean):  1.85 cm     +------------------+------------++ AV Area (VTI):    1.87 cm     +------------------+------------++ AV Vmax:          189.00 cm/s  +------------------+------------++ AV Vmean:         114.000 cm/s +------------------+------------++ AV VTI:           0.372 m      +------------------+------------++ AV Peak Grad:     14.3 mmHg    +------------------+------------++ AV Mean Grad:     6.0 mmHg     +------------------+------------++ LVOT Vmax:        109.00 cm/s  +------------------+------------++ LVOT Vmean:       74.200 cm/s  +------------------+------------++ LVOT VTI:         0.246 m      +------------------+------------++ LVOT/AV VTI ratio:0.66         +------------------+------------++  +--------------+-------++ AORTA                 +--------------+-------++ Ao Sinus diam:2.80 cm +--------------+-------++ Ao STJ diam:  2.4 cm  +--------------+-------++ Ao Asc diam:  2.80 cm +--------------+-------++  +--------------+----------++ MITRAL VALVE              +--------------+-------+ +--------------+----------++  SHUNTS                MV Area (PHT):2.82 cm    +--------------+-------+ +--------------+----------++  Systemic VTI: 0.25 m  MV Peak grad: 2.8 mmHg    +--------------+-------+ +--------------+----------++  Systemic Diam:1.90 cm MV Mean grad: 1.0 mmHg     +--------------+-------+ +--------------+----------++ MV Vmax:      0.83 m/s   +--------------+----------++ MV Vmean:     41.2 cm/s  +--------------+----------++ MV VTI:       0.30 m     +--------------+----------++ MV PHT:       78.01 msec +--------------+----------++ MV Decel Time:269 msec   +--------------+----------++ +--------------+----------++ MV E velocity:81.60 cm/s +--------------+----------++ MV A velocity:78.55 cm/s +--------------+----------++ MV E/A ratio: 1.04       +--------------+----------++   Val Eagle MD Electronically signed by Val Eagle MD Signature Date/Time: 04/07/2023/9:30:12 AM    Final        ______________________________________________________________________________________________             Recent Labs: 11/10/2022: TSH 1.850 04/03/2023: ALT 40 04/06/2023: Magnesium 2.3 04/08/2023: BUN 11; Creatinine, Ser 0.78; Potassium 3.3; Sodium 135 04/09/2023: Hemoglobin 12.1; Platelets 189  Recent Lipid Panel    Component Value Date/Time   CHOL 132 11/10/2022 1100   TRIG 111 11/10/2022 1100   HDL 49 11/10/2022 1100   CHOLHDL 2.7 11/10/2022 1100   CHOLHDL 4.7 05/17/2019 1104   VLDL 23 11/17/2015 1050   LDLCALC 63 11/10/2022 1100   LDLCALC 130 (H) 05/17/2019 1104   LDLDIRECT 157.7 03/25/2013 1029     Risk Assessment/Calculations:                Physical Exam:    VS:  BP 118/80 (BP Location: Right Arm, Patient Position: Sitting, Cuff Size: Normal)   Pulse 87   Ht  5\' 4"  (1.626 m)   Wt 196 lb (88.9 kg)   SpO2 96%   BMI 33.64 kg/m     Wt Readings from Last 3 Encounters:  04/25/23 196 lb (88.9 kg)  04/09/23 195 lb 12.8 oz (88.8 kg)  04/03/23 195 lb 6.4 oz (88.6 kg)     GEN:  Well nourished, well developed in no acute distress HEENT: Normal NECK: No JVD; No carotid bruits LYMPHATICS: No lymphadenopathy CARDIAC: RRR, no murmurs, rubs, gallops RESPIRATORY:  Clear to  auscultation without rales, wheezing or rhonchi  ABDOMEN: Soft, non-tender, non-distended MUSCULOSKELETAL:  No edema; No deformity  SKIN: Warm and dry NEUROLOGIC:  Alert and oriented x 3 PSYCHIATRIC:  Normal affect   ASSESSMENT:    1. Coronary artery disease involving native coronary artery of native heart with unstable angina pectoris (HCC)   2. Hx of CABG   3. Primary hypertension   4. Hyperlipidemia with target LDL less than 70   5. Bilateral carotid artery stenosis   6. Type 2 diabetes mellitus with complications (HCC)    PLAN:    In order of problems listed above:  CAD s/p CABG x 3 -This was performed off-pump -She remains on aspirin 325 mg  -Continue 100 mg Toprol daily, 40 mg Crestor -- BP controlled   Hypertension - Continue Toprol   Hyperlipidemia with LDL goal less than 70 11/10/2022: Cholesterol, Total 132; HDL 49; LDL Chol Calc (NIH) 63; Triglycerides 111 Continue 40 mg crestor   Carotid artery stenosis [place] pre-CABG with 40-59% on right and 1-39% on left - no recent stroke or syncope   DM - on mounjaro - recheck A1c in 3 months   Follow up in 3 months with Dr. Allyson Sabal.      Cardiac Rehabilitation Eligibility Assessment  The patient is ready to start cardiac rehabilitation pending clearance from the cardiac surgeon.          Medication Adjustments/Labs and Tests Ordered: Current medicines are reviewed at length with the patient today.  Concerns regarding medicines are outlined above.  Orders Placed This Encounter  Procedures   EKG 12-Lead   No orders of the defined types were placed in this encounter.   Patient Instructions  Medication Instructions:  Your physician recommends that you continue on your current medications as directed. Please refer to the Current Medication list given to you today.  *If you need a refill on your cardiac medications before your next appointment, please call your pharmacy*   Follow-Up: At [facility], you and your health needs are our priority.  As part of our continuing mission to provide you with exceptional heart care, we have created designated Provider Care Teams.  These Care Teams include your primary Cardiologist (physician) and Advanced Practice Providers (APPs -  Physician Assistants and Nurse Practitioners) who all work together to provide you with the care you need, when you need it.   Your next appointment:   07/27/2023 at 3:10 pm, Micah Flesher PA  Then follow up in 6 months with Dr. Allyson Sabal  Provider:   Micah Flesher PA  Other Instructions      Signed, Roe Rutherford Alessander Sikorski, [place]  04/25/2023 4:49 PM    [facility]

## 2023-04-17 ENCOUNTER — Other Ambulatory Visit (HOSPITAL_BASED_OUTPATIENT_CLINIC_OR_DEPARTMENT_OTHER): Payer: Self-pay

## 2023-04-20 ENCOUNTER — Other Ambulatory Visit (HOSPITAL_BASED_OUTPATIENT_CLINIC_OR_DEPARTMENT_OTHER): Payer: Self-pay

## 2023-04-20 ENCOUNTER — Other Ambulatory Visit: Payer: Self-pay | Admitting: Physician Assistant

## 2023-04-20 ENCOUNTER — Telehealth: Payer: Self-pay

## 2023-04-20 MED ORDER — OXYCODONE HCL 5 MG PO TABS
5.0000 mg | ORAL_TABLET | Freq: Four times a day (QID) | ORAL | 0 refills | Status: DC | PRN
  Filled 2023-04-20: qty 28, 7d supply, fill #0

## 2023-04-20 MED ORDER — LIDOCAINE 4 % EX PTCH
1.0000 | MEDICATED_PATCH | CUTANEOUS | 0 refills | Status: DC
  Filled 2023-04-20: qty 7, 7d supply, fill #0
  Filled 2023-04-21 – 2023-04-22 (×2): qty 5, 5d supply, fill #0

## 2023-04-20 NOTE — Telephone Encounter (Signed)
 Patient contacted the office requesting refill of her pain medication. She is s/p Off pump CABG with Dr. Cliffton Asters 04/05/23. She was discharged from the hospital on 04/09/23. Spoke with [place], PA whom prescribed patient Oxycodone refill 5ng q6hrs prn in addition to Lidocaine patches for patient to use. Advised that patient can also take Tylenol as needed in between oxycodone. Left voicemail message for call back regarding medication.

## 2023-04-21 ENCOUNTER — Ambulatory Visit: Payer: BC Managed Care – PPO | Admitting: Internal Medicine

## 2023-04-21 ENCOUNTER — Other Ambulatory Visit (HOSPITAL_BASED_OUTPATIENT_CLINIC_OR_DEPARTMENT_OTHER): Payer: Self-pay

## 2023-04-22 ENCOUNTER — Other Ambulatory Visit (HOSPITAL_BASED_OUTPATIENT_CLINIC_OR_DEPARTMENT_OTHER): Payer: Self-pay

## 2023-04-25 ENCOUNTER — Encounter: Payer: Self-pay | Admitting: Physician Assistant

## 2023-04-25 ENCOUNTER — Ambulatory Visit: Payer: BC Managed Care – PPO | Attending: Physician Assistant | Admitting: Physician Assistant

## 2023-04-25 ENCOUNTER — Ambulatory Visit: Payer: BC Managed Care – PPO | Admitting: Physician Assistant

## 2023-04-25 VITALS — BP 118/80 | HR 87 | Ht 64.0 in | Wt 196.0 lb

## 2023-04-25 DIAGNOSIS — I6523 Occlusion and stenosis of bilateral carotid arteries: Secondary | ICD-10-CM

## 2023-04-25 DIAGNOSIS — I10 Essential (primary) hypertension: Secondary | ICD-10-CM

## 2023-04-25 DIAGNOSIS — E785 Hyperlipidemia, unspecified: Secondary | ICD-10-CM

## 2023-04-25 DIAGNOSIS — I25110 Atherosclerotic heart disease of native coronary artery with unstable angina pectoris: Secondary | ICD-10-CM | POA: Diagnosis not present

## 2023-04-25 DIAGNOSIS — E118 Type 2 diabetes mellitus with unspecified complications: Secondary | ICD-10-CM

## 2023-04-25 DIAGNOSIS — Z951 Presence of aortocoronary bypass graft: Secondary | ICD-10-CM | POA: Diagnosis not present

## 2023-04-25 NOTE — Patient Instructions (Addendum)
 Medication Instructions:  Your physician recommends that you continue on your current medications as directed. Please refer to the Current Medication list given to you today.  *If you need a refill on your cardiac medications before your next appointment, please call your pharmacy*   Follow-Up: At [facility], you and your health needs are our priority.  As part of our continuing mission to provide you with exceptional heart care, we have created designated Provider Care Teams.  These Care Teams include your primary Cardiologist (physician) and Advanced Practice Providers (APPs -  Physician Assistants and Nurse Practitioners) who all work together to provide you with the care you need, when you need it.   Your next appointment:   07/27/2023 at 3:10 pm, Micah Flesher PA  Then follow up in 6 months with Dr. Allyson Sabal  Provider:   Micah Flesher PA  Other Instructions

## 2023-04-27 NOTE — Progress Notes (Signed)
 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 Cliffton Asters on 04/05/2023. She was discharged on 04/09/2023. She had a virtual follow up with Dr. Cliffton Asters on 04/14/2023 and she was recovering well at that time. She denies chest pain or shortness of breath. Of note, she has a lot of stress in her life right now. Her husband had his third stroke and she is taking care of her grandchild as the parents are separating.  Current Outpatient Medications  Medication Sig Dispense Refill   acetaminophen (TYLENOL) 325 MG tablet Take 2 tablets (650 mg total) by mouth every 6 (six) hours as needed. (Patient not taking: Reported on 04/25/2023)     Albuterol-Budesonide (AIRSUPRA) 90-80 MCG/ACT AERO Inhale 1-2 Inhalations into the lungs every 6 (six) hours as needed for wheezing, cough, shortness of breath. 10.7 g 6   ALPRAZolam (XANAX) 0.5 MG tablet Take 0.5-1 tablets (0.25-0.5 mg total) by mouth 2 (two) times daily as needed for anxiety. 60 tablet 3   aspirin EC 325 MG tablet Take 1 tablet (325 mg total) by mouth daily. 100 tablet 3   B Complex Vitamins (VITAMIN B COMPLEX PO) Take 1 tablet by mouth daily.     cetirizine (ZYRTEC) 10 MG tablet Take 1 tablet (10 mg total) by mouth daily. 30 tablet 11   Continuous Glucose Receiver (DEXCOM G7 RECEIVER) DEVI For use with Dexcom G7 sensor 1 each 0   Continuous Glucose Sensor (DEXCOM G7 SENSOR) MISC Apply new sensor every 10 days for continuous glucose monitoring. 9 each 3   fluticasone (FLONASE) 50 MCG/ACT nasal spray Place 2 sprays into both nostrils 2 (two) times daily. (Patient taking differently: Place 2 sprays into both nostrils 2 (two) times daily as needed for allergies.) 16 g 6   fluticasone furoate-vilanterol (BREO ELLIPTA) 100-25 MCG/ACT AEPB Inhale 1 puff into  the lungs daily. 60 each 5   guaiFENesin (MUCINEX) 600 MG 12 hr tablet Take 1 tablet (600 mg total) by mouth 2 (two) times daily. (Patient not taking: Reported on 04/25/2023)     levothyroxine (SYNTHROID) 112 MCG tablet Take 1 tablet (112 mcg total) by mouth daily. 90 tablet 1   lidocaine 4 % Place 1 patch onto the skin daily. Please apply to either side of chest 5 patch 0   Menthol, Topical Analgesic, (ICY HOT EX) Apply 1 application topically daily as needed (pain).     metoprolol succinate (TOPROL-XL) 100 MG 24 hr tablet Take 1 tablet (100 mg total) by mouth daily. Take with or immediately following a meal. 90 tablet 0   nitroGLYCERIN (NITROSTAT) 0.4 MG SL tablet Place 1 tablet (0.4 mg total) under the tongue every 5 (five) minutes as needed for chest pain. Max three doses in a row. Proceed to ED for evaluation if no improvement. 25 tablet 3   oxyCODONE (OXY IR/ROXICODONE) 5 MG immediate release tablet Take 1 tablet (5 mg total) by mouth every 6 (six) hours as needed for severe pain (pain score 7-10). 28 tablet 0   pantoprazole (PROTONIX) 40 MG tablet Take 1 tablet (40 mg total) by mouth daily. 90 tablet 0   Probiotic Product (PROBIOTIC PO) Take 1 capsule by mouth in  the morning.     rosuvastatin (CRESTOR) 40 MG tablet Take 1 tablet (40 mg total) by mouth daily. 90 tablet 0   Tiotropium Bromide Monohydrate (SPIRIVA RESPIMAT) 2.5 MCG/ACT AERS Inhale 2 puffs into the lungs daily. 4 g 11   tirzepatide (MOUNJARO) 12.5 MG/0.5ML Pen Inject 12.5 mg into the skin once a week. (Patient taking differently: Inject 12.5 mg into the skin every Friday.) 6 mL 1   venlafaxine XR (EFFEXOR-XR) 75 MG 24 hr capsule Take 3 capsules (225 mg total) by mouth daily with breakfast. 270 capsule 3   Vitamin D, Ergocalciferol, (DRISDOL) 1.25 MG (50000 UNIT) CAPS capsule Take 1 capsule (50,000 Units total) by mouth every 7 (seven) days. (Patient taking differently: Take 50,000 Units by mouth every Sunday.) 12 capsule 0  Vital  Signs: Vitals:   05/11/23 1450  BP: 129/77  Pulse: 82  Resp: 20  SpO2: 97%     Physical Exam: CV-RRR Pulmonary-Clear to auscultation bilaterally Abdomen-Soft, non tender, bowel sounds present Extremities-No LE edema Wounds-Sternal and RLE wounds are clean, dry, well healed without signs of infection.  Diagnostic Tests:  Narrative & Impression  CLINICAL DATA:  Follow-up CABG   EXAM: CHEST - 2 VIEW   COMPARISON:  04/09/2023   FINDINGS: Changes of CABG. Heart and mediastinal contours are within normal limits. No focal opacities or effusions. No acute bony abnormality. No pneumothorax.   IMPRESSION: No active cardiopulmonary disease.     Electronically Signed   By: Charlett Nose M.D.   On: 05/11/2023 14:14    Impression and Plan: We reviewed today's chest x ray. We discussed continuance of sternal precautions until 06/03/2023. She has already been driving (she does not use narcotics at least 12 hours before driving) and participation in cardiac rehab, which she is looking forward to doing. We will refer her to [facility] outpatient cardiac rehab as she lives in [place]. She saw Micah Flesher PA-C from cardiology on 03/10. There were no medication changes made.  She will continued to be followed by cardiology (she will see Dr. Allyson Sabal on 06/13/2023) and will return to [facility]   Ardelle Balls, PA-C [facility] [phone]

## 2023-05-01 ENCOUNTER — Other Ambulatory Visit: Payer: Self-pay | Admitting: Physician Assistant

## 2023-05-01 ENCOUNTER — Encounter: Admitting: Nurse Practitioner

## 2023-05-04 ENCOUNTER — Other Ambulatory Visit: Payer: Self-pay | Admitting: Physician Assistant

## 2023-05-04 MED ORDER — OXYCODONE HCL 5 MG PO TABS: 5.0000 mg | ORAL_TABLET | Freq: Four times a day (QID) | ORAL | 0 refills | Status: DC | PRN

## 2023-05-04 NOTE — Progress Notes (Signed)
 Patient again called requesting pain medication refill.  She is S/P OP CABG  with Dr. Cliffton Asters from February 2025.  She will be give her final refill of pain medication  Oxycodone 1 tablet every 6 hours as needed for pain   Dispo: #30 without refills  She was instructed on importance of using Tylenol at this point for pain.  Lowella Dandy, PA-C 4:03 PM 05/04/23

## 2023-05-09 ENCOUNTER — Other Ambulatory Visit: Payer: Self-pay | Admitting: Thoracic Surgery (Cardiothoracic Vascular Surgery)

## 2023-05-09 DIAGNOSIS — Z951 Presence of aortocoronary bypass graft: Secondary | ICD-10-CM

## 2023-05-10 ENCOUNTER — Telehealth (HOSPITAL_COMMUNITY): Payer: Self-pay

## 2023-05-10 NOTE — Telephone Encounter (Signed)
 Attempted to call patient in regards to Cardiac Rehab - unable to leave VM, VM box full.

## 2023-05-11 ENCOUNTER — Ambulatory Visit
Admission: RE | Admit: 2023-05-11 | Discharge: 2023-05-11 | Disposition: A | Discharge status: Home / Self Care | Source: Ambulatory Visit | Attending: Thoracic Surgery (Cardiothoracic Vascular Surgery) | Admitting: Thoracic Surgery (Cardiothoracic Vascular Surgery)

## 2023-05-11 ENCOUNTER — Ambulatory Visit (INDEPENDENT_AMBULATORY_CARE_PROVIDER_SITE_OTHER): Payer: BC Managed Care – PPO | Admitting: Physician Assistant

## 2023-05-11 ENCOUNTER — Encounter: Payer: BC Managed Care – PPO | Admitting: Nurse Practitioner

## 2023-05-11 ENCOUNTER — Other Ambulatory Visit (HOSPITAL_BASED_OUTPATIENT_CLINIC_OR_DEPARTMENT_OTHER): Payer: Self-pay

## 2023-05-11 VITALS — BP 129/77 | HR 82 | Resp 20 | Wt 195.8 lb

## 2023-05-11 DIAGNOSIS — Z951 Presence of aortocoronary bypass graft: Secondary | ICD-10-CM

## 2023-05-11 DIAGNOSIS — Z9889 Other specified postprocedural states: Secondary | ICD-10-CM

## 2023-05-11 MED ORDER — TRAMADOL HCL 50 MG PO TABS
50.0000 mg | ORAL_TABLET | Freq: Four times a day (QID) | ORAL | 0 refills | Status: DC | PRN
  Filled 2023-05-11: qty 30, 7d supply, fill #0

## 2023-05-11 NOTE — Patient Instructions (Signed)
 Continue to avoid any heavy lifting or strenuous use of your arms or shoulders for at least a total of three months from the time of surgery.  After three months you may gradually increase how much you lift or otherwise use your arms or chest as tolerated, with limits based upon whether or not activities lead to the return of significant discomfort. 2. You may return to driving an automobile as long as you are no longer requiring oral narcotic pain relievers during the daytime.  It would be wise to start driving only short distances during the daylight and gradually increase from there as you feel comfortable. 3. You are encouraged to enroll and participate in the outpatient cardiac rehab program beginning as soon as practical.

## 2023-05-17 ENCOUNTER — Encounter (HOSPITAL_COMMUNITY): Payer: Self-pay

## 2023-05-17 ENCOUNTER — Telehealth (HOSPITAL_COMMUNITY): Payer: Self-pay

## 2023-05-17 NOTE — Telephone Encounter (Signed)
 Pt insurance is active and benefits verified through [employer]. Co-pay $30.00, DED $1,000.00/$1,000.00 met, out of pocket $2,500.00/$2,500.00 met, co-insurance $0.00. No pre-authorization required. Passport, 05/17/23 @ 2:38PM, REF#[id number]   How many CR sessions are covered? (36 visits for TCR, 72 visits for ICR)72 Is this a lifetime maximum or an annual maximum? Annual Has the member used any of these services to date? No Is there a time limit (weeks/months) on start of program and/or program completion? No

## 2023-05-17 NOTE — Telephone Encounter (Signed)
 Pt returned CR phone call and stated she is interested in CR. Patient will come in for orientation on 05/18/23 @ 930AM and will attend the 815AM exercise class. Went over insurance, patient verbalized understanding.   Pensions consultant.

## 2023-05-17 NOTE — Telephone Encounter (Signed)
 Attempted to call patient in regards to Cardiac Rehab - LM on VM Mailed letter

## 2023-05-18 ENCOUNTER — Inpatient Hospital Stay (HOSPITAL_COMMUNITY): Admission: RE | Admit: 2023-05-18 | Source: Ambulatory Visit

## 2023-05-18 ENCOUNTER — Telehealth (HOSPITAL_COMMUNITY): Payer: Self-pay

## 2023-05-18 NOTE — Telephone Encounter (Signed)
 Patient c/o due to asthma attack, will reschedule.

## 2023-05-19 ENCOUNTER — Telehealth (HOSPITAL_COMMUNITY): Payer: Self-pay

## 2023-05-19 NOTE — Telephone Encounter (Signed)
 Patient called to reschedule orientation and classes for CR Program. Patient will come in for orientation on 05/23/2023 and will attend the 6:45am exercise class.  Sent MyChart message.

## 2023-05-19 NOTE — Telephone Encounter (Signed)
 Patient will be out 4/21 and 4/23, will make up classes. Grad date extended from 7/02 to 7/09.

## 2023-05-22 ENCOUNTER — Ambulatory Visit (HOSPITAL_COMMUNITY)

## 2023-05-23 ENCOUNTER — Encounter (HOSPITAL_COMMUNITY)
Admission: RE | Admit: 2023-05-23 | Discharge: 2023-05-23 | Disposition: A | Discharge status: Home / Self Care | Payer: Self-pay | Source: Ambulatory Visit | Attending: Cardiovascular Disease | Admitting: Cardiovascular Disease

## 2023-05-23 VITALS — BP 144/80 | HR 84 | Ht 65.0 in | Wt 194.9 lb

## 2023-05-23 DIAGNOSIS — Z951 Presence of aortocoronary bypass graft: Secondary | ICD-10-CM | POA: Insufficient documentation

## 2023-05-23 NOTE — Progress Notes (Signed)
 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  Patient's Home Medications on Admission:  Current Outpatient Medications:    acetaminophen (TYLENOL) 325 MG tablet, Take 2 tablets (650 mg total) by mouth every 6 (six) hours as needed., Disp: , Rfl:    Albuterol-Budesonide (AIRSUPRA) 90-80 MCG/ACT AERO, Inhale 1-2 Inhalations into the lungs every 6 (six) hours as needed for wheezing, cough, shortness of breath., Disp: 10.7 g, Rfl: 6   ALPRAZolam (XANAX) 0.5 MG tablet, Take 0.5-1 tablets (0.25-0.5 mg total) by mouth 2 (two) times daily as needed for anxiety., Disp: 60 tablet, Rfl: 3   aspirin EC 325 MG tablet, Take 1 tablet (325 mg total) by mouth daily., Disp: 100 tablet, Rfl: 3   cetirizine (ZYRTEC) 10 MG tablet, Take 1 tablet (10 mg total) by mouth daily. (Patient taking differently: Take 10 mg by mouth daily as needed.), Disp: 30 tablet, Rfl: 11   Continuous Glucose Receiver (DEXCOM G7 RECEIVER) DEVI, For use with Dexcom G7 sensor, Disp: 1 each, Rfl: 0   Continuous Glucose Sensor (DEXCOM G7 SENSOR) MISC, Apply new sensor every 10 days for continuous glucose monitoring., Disp: 9 each, Rfl: 3   fluticasone (FLONASE) 50 MCG/ACT nasal spray, Place 2 sprays into both nostrils 2 (two) times daily. (Patient taking differently: Place 2 sprays into both nostrils 2 (two) times daily as needed for allergies.), Disp: 16 g, Rfl: 6   fluticasone furoate-vilanterol (BREO ELLIPTA) 100-25 MCG/ACT AEPB, Inhale 1 puff into the  lungs daily., Disp: 60 each, Rfl: 5   levothyroxine (SYNTHROID) 112 MCG tablet, Take 1 tablet (112 mcg total) by mouth daily., Disp: 90 tablet, Rfl: 1   lidocaine 4 %, Place 1 patch onto the skin daily. Please apply to either side of chest (Patient taking differently: Place 1 patch onto the skin daily as needed. Please apply to either side of chest), Disp: 5 patch, Rfl: 0   Menthol, Topical Analgesic, (ICY HOT EX), Apply 1 application topically daily as needed (pain)., Disp: , Rfl:    metoprolol succinate (TOPROL-XL) 100 MG 24 hr tablet, Take 1 tablet (100 mg total) by mouth daily. Take with or immediately following a meal., Disp: 90 tablet, Rfl: 0   nitroGLYCERIN (NITROSTAT) 0.4 MG SL tablet, Place 1 tablet (0.4 mg total) under the tongue every 5 (five) minutes as needed for chest pain. Max three doses in a row. Proceed to ED for evaluation if no improvement., Disp: 25 tablet, Rfl: 3   pantoprazole (PROTONIX) 40 MG tablet, Take 1 tablet (40 mg total) by mouth daily., Disp: 90 tablet, Rfl: 0   Probiotic Product (PROBIOTIC PO), Take 1 capsule by mouth in the morning., Disp: , Rfl:    rosuvastatin (CRESTOR) 40 MG tablet, Take 1 tablet (40 mg total) by mouth daily., Disp: 90 tablet, Rfl: 0   Tiotropium Bromide Monohydrate (SPIRIVA RESPIMAT) 2.5 MCG/ACT AERS, Inhale 2 puffs into the lungs daily., Disp: 4 g, Rfl: 11   tirzepatide (MOUNJARO) 12.5 MG/0.5ML Pen, Inject 12.5 mg into  the skin once a week. (Patient taking differently: Inject 12.5 mg into the skin every Friday.), Disp: 6 mL, Rfl: 1   traMADol (ULTRAM) 50 MG tablet, Take 1 tablet (50 mg total) by mouth every 6 (six) hours as needed., Disp: 30 tablet, Rfl: 0   venlafaxine XR (EFFEXOR-XR) 75 MG 24 hr capsule, Take 3 capsules (225 mg total) by mouth daily with breakfast., Disp: 270 capsule, Rfl: 3   Vitamin D, Ergocalciferol, (DRISDOL) 1.25 MG (50000 UNIT) CAPS capsule, Take 1 capsule (50,000 Units total) by mouth every 7 (seven) days. (Patient taking  differently: Take 50,000 Units by mouth every Sunday.), Disp: 12 capsule, Rfl: 0   B Complex Vitamins (VITAMIN B COMPLEX PO), Take 1 tablet by mouth daily. (Patient not taking: Reported on 05/23/2023), Disp: , Rfl:    guaiFENesin (MUCINEX) 600 MG 12 hr tablet, Take 1 tablet (600 mg total) by mouth 2 (two) times daily. (Patient taking differently: Take 600 mg by mouth 2 (two) times daily as needed.), Disp: , Rfl:   Past Medical History: Past Medical History:  Diagnosis Date   Abdominal bloating 11/05/2020   Abnormality of right breast on screening mammogram 10/14/2019   Needs further views of right breast.  Breast center to call.       Achilles tendon contracture, right    Acute bilateral low back pain without sciatica 08/04/2006   Qualifier: Diagnosis of   By: Marcelyn Ditty, RN, Katy Fitch        Acute non-recurrent frontal sinusitis 12/24/2019   Acute recurrent pansinusitis 08/04/2020   ALLERGIC RHINITIS 04/10/2008   Allergic rhinitis 04/10/2008   Qualifier: Diagnosis of  By: Amador Cunas  MD, Janett Labella    Anxiety    Arthritis    Asthma    asthmatic bronchitis   Breast lump in female    Carpal tunnel syndrome of right wrist    Cellulitis 10/31/2019   Chronic right shoulder pain 05/07/2019   Complication of anesthesia    trouble waking up after tubal ligation   Coronary artery disease    DEPRESSION 08/13/2008   Diabetes mellitus without complication (HCC)    Fatigue 11/17/2015   Fatty liver disease, nonalcoholic 03/29/2013   GERD (gastroesophageal reflux disease)    Headache    Occasional Migraines   HYPERLIPIDEMIA 08/04/2006   HYPERTENSION 08/04/2006   HYPOTHYROIDISM 08/04/2006   Insomnia 10/31/2019   Kidney injury    LOW BACK PAIN 08/04/2006   Moderate persistent asthma with (acute) exacerbation 06/27/2019   Neuromuscular disorder (HCC)    Pain of finger of right hand 02/09/2021   Patellofemoral syndrome, right 06/19/2019   PERIMENOPAUSAL SYNDROME 05/08/2008   Plantar fasciitis of  right foot 04/12/2017   Plantar fasciitis, left 11/17/2015   Pneumonia    Postmenopausal bleeding 07/17/2019   Biopsy and [place] c/w endometrial polyp.  Pt to see Dr. Earlene Plater for surgical consult.     Primary insomnia 10/31/2019   PVD (peripheral vascular disease) (HCC)    occlusive, status post bifem bypass 2007   SOB (shortness of breath) 07/12/2016   Vaginal candidiasis 10/31/2019    Tobacco Use: Social History   Tobacco Use  Smoking Status Former   Current packs/day: 0.00   Types: Cigarettes   Quit date: 02/15/2004   Years since quitting: 19.2  Smokeless Tobacco Never    Labs: Review Flowsheet  More data exists      Latest Ref Rng & Units 02/03/2022 08/08/2022 11/10/2022 04/03/2023 04/05/2023  Labs for ITP Cardiac and Pulmonary Rehab  Cholestrol 100 - 199 mg/dL - 161  096  - -  LDL (calc) 0 - 99 mg/dL - 045  63  - -  HDL-C >40 mg/dL - 40  49  - -  Trlycerides 0 - 149 mg/dL - 981  191  - -  Hemoglobin A1c 4.8 - 5.6 % 10.1  6.4  6.3  6.1  -  PH, Arterial 7.35 - 7.45 - - - - 7.341  7.306  7.336   PCO2 arterial 32 - 48 mmHg - - - - 47.5  41.9  34.1   Bicarbonate 20.0 - 28.0 mmol/L - - - - 25.8  21.2  18.6   TCO2 22 - 32 mmol/L - - - - 27  23  20  22  23  26    Acid-base deficit 0.0 - 2.0 mmol/L - - - - 1.0  5.0  7.0   O2 Saturation % - - - - 95  98  99     Details       Multiple values from one day are sorted in reverse-chronological order         Capillary Blood Glucose: Lab Results  Component Value Date   GLUCAP 153 (H) 04/09/2023   GLUCAP 104 (H) 04/09/2023   GLUCAP 108 (H) 04/08/2023   GLUCAP 130 (H) 04/08/2023   GLUCAP 115 (H) 04/08/2023     Exercise Target Goals: Exercise Program Goal: Individual exercise prescription set using results from initial 6 min walk test and THRR while considering  patient's activity barriers and safety.   Exercise Prescription Goal: Initial exercise prescription builds to 30-45 minutes a day of aerobic activity, 2-3 days per  week.  Home exercise guidelines will be given to patient during program as part of exercise prescription that the participant will acknowledge.  Activity Barriers & Risk Stratification:  Activity Barriers & Cardiac Risk Stratification - 05/23/23 0836       Activity Barriers & Cardiac Risk Stratification   Activity Barriers Back Problems;History of Falls;Other (comment);Neck/Spine Problems    Comments PVD- bypass in 2006; bone spurs in both knees, right worse than left- gets injections; cervicalgia- takes celebrex, neck exercises; hx right shoulder surgery- repaired biceps tear.    Cardiac Risk Stratification High             6 Minute Walk:  6 Minute Walk     Row Name 05/23/23 0928         6 Minute Walk   Phase Initial     Distance 1310 feet     Walk Time 6 minutes     # of Rest Breaks 0     MPH 2.48     METS 2.9     RPE 12     Perceived Dyspnea  1     VO2 Peak 10.16     Symptoms Yes (comment)     Comments Mild shortness of breath. discomfort in neck/ cervical spine into chest area. She has chronic cervicalgia and also felt may be related to anxiety. resolved with rest.     Resting HR 84 bpm     Resting BP 144/80     Resting Oxygen Saturation  98 %     Exercise Oxygen Saturation  during 6 min walk 97 %     Max Ex. HR 90 bpm     Max Ex. BP 132/82     2 Minute Post BP 138/86  Oxygen Initial Assessment:   Oxygen Re-Evaluation:   Oxygen Discharge (Final Oxygen Re-Evaluation):   Initial Exercise Prescription:  Initial Exercise Prescription - 05/23/23 1000       Date of Initial Exercise RX and Referring Provider   Date 05/23/23    Referring Provider Runell Gess, MD    Expected Discharge Date 08/23/23      Treadmill   MPH 2    Grade 0    Minutes 15    METs 2.53      NuStep   Level 1    SPM 85    Minutes 15    METs 2.2      Prescription Details   Frequency (times per week) 3    Duration Progress to 30 minutes of continuous  aerobic without signs/symptoms of physical distress      Intensity   THRR 40-80% of Max Heartrate 63-126    Ratings of Perceived Exertion 11-13    Perceived Dyspnea 0-4      Progression   Progression Continue to progress workloads to maintain intensity without signs/symptoms of physical distress.      Resistance Training   Training Prescription Yes    Weight 2 lbs    Reps 10-15             Perform Capillary Blood Glucose checks as needed.  Exercise Prescription Changes:   Exercise Comments:   Exercise Goals and Review:   Exercise Goals     Row Name 05/23/23 0756             Exercise Goals   Increase Physical Activity Yes       Intervention Provide advice, education, support and counseling about physical activity/exercise needs.;Develop an individualized exercise prescription for aerobic and resistive training based on initial evaluation findings, risk stratification, comorbidities and participant's personal goals.       Expected Outcomes Short Term: Attend rehab on a regular basis to increase amount of physical activity.;Long Term: Exercising regularly at least 3-5 days a week.;Long Term: Add in home exercise to make exercise part of routine and to increase amount of physical activity.       Increase Strength and Stamina Yes       Intervention Provide advice, education, support and counseling about physical activity/exercise needs.;Develop an individualized exercise prescription for aerobic and resistive training based on initial evaluation findings, risk stratification, comorbidities and participant's personal goals.       Expected Outcomes Short Term: Increase workloads from initial exercise prescription for resistance, speed, and METs.;Short Term: Perform resistance training exercises routinely during rehab and add in resistance training at home;Long Term: Improve cardiorespiratory fitness, muscular endurance and strength as measured by increased METs and functional  capacity ( )       Able to understand and use rate of perceived exertion (RPE) scale Yes       Intervention Provide education and explanation on how to use RPE scale       Expected Outcomes Short Term: Able to use RPE daily in rehab to express subjective intensity level;Long Term:  Able to use RPE to guide intensity level when exercising independently       Knowledge and understanding of Target Heart Rate Range (THRR) Yes       Intervention Provide education and explanation of THRR including how the numbers were predicted and where they are located for reference       Expected Outcomes Short Term: Able to state/look up THRR;Long Term: Able to use THRR  to govern intensity when exercising independently;Short Term: Able to use daily as guideline for intensity in rehab       Able to check pulse independently Yes       Intervention Provide education and demonstration on how to check pulse in carotid and radial arteries.;Review the importance of being able to check your own pulse for safety during independent exercise       Expected Outcomes Short Term: Able to explain why pulse checking is important during independent exercise;Long Term: Able to check pulse independently and accurately       Understanding of Exercise Prescription Yes       Intervention Provide education, explanation, and written materials on patient's individual exercise prescription       Expected Outcomes Short Term: Able to explain program exercise prescription;Long Term: Able to explain home exercise prescription to exercise independently                Exercise Goals Re-Evaluation :   Discharge Exercise Prescription (Final Exercise Prescription Changes):   Nutrition:  Target Goals: Understanding of nutrition guidelines, daily intake of sodium 1500mg , cholesterol 200mg , calories 30% from fat and 7% or less from saturated fats, daily to have 5 or more servings of fruits and vegetables.  Biometrics:  Pre Biometrics -  05/23/23 0749       Pre Biometrics   Waist Circumference 44.75 inches    Hip Circumference 45.25 inches    Waist to Hip Ratio 0.99 %    Triceps Skinfold 34 mm    % Body Fat 45.5 %    Grip Strength 22 kg    Flexibility 10.5 in    Single Leg Stand 30 seconds              Nutrition Therapy Plan and Nutrition Goals:   Nutrition Assessments:  MEDIFICTS Score Key: >=70 Need to make dietary changes  40-70 Heart Healthy Diet <= 40 Therapeutic Level Cholesterol Diet    Picture Your Plate Scores: <16 Unhealthy dietary pattern with much room for improvement. 41-50 Dietary pattern unlikely to meet recommendations for good health and room for improvement. 51-60 More healthful dietary pattern, with some room for improvement.  >60 Healthy dietary pattern, although there may be some specific behaviors that could be improved.    Nutrition Goals Re-Evaluation:   Nutrition Goals Re-Evaluation:   Nutrition Goals Discharge (Final Nutrition Goals Re-Evaluation):   Psychosocial: Target Goals: Acknowledge presence or absence of significant depression and/or stress, maximize coping skills, provide positive support system. Participant is able to verbalize types and ability to use techniques and skills needed for reducing stress and depression.  Initial Review & Psychosocial Screening:  Initial Psych Review & Screening - 05/23/23 0853       Initial Review   Current issues with Current Anxiety/Panic;Current Psychotropic Meds;Current Sleep Concerns;Current Stress Concerns    Source of Stress Concerns Family    Comments Tahlia's husband recently had a '3rd Stroke" is not able to drive and is using a cane. Meaghen's husband declined physical therapy after one session bu has made some improvement.  Syenna's son is going througha seperation. Maudy pick's up her grandson on Wednesday and every other weekend. Audreana deines being depressed but does have anxiety. Danica takes Xanax and is talking  effexor after going through menopause. Tamu denies the need for counselling at this time. Ernestine says if she needs to talk with someone she can talk with her brother.      Family Dynamics  Good Support System? Yes   Paizlee has her husband her son who lives near by and a brother who lives in [place] for support   Concerns Recent loss of child    Comments Marvette lost her daughter to ovarian cancer at age 31      Barriers   Psychosocial barriers to participate in program The patient should benefit from training in stress management and relaxation.      Screening Interventions   Interventions Encouraged to exercise;To provide support and resources with identified psychosocial needs;Provide feedback about the scores to participant    Expected Outcomes Long Term Goal: Stressors or current issues are controlled or eliminated.;Short Term goal: Identification and review with participant of any Quality of Life or Depression concerns found by scoring the questionnaire.;Long Term goal: The participant improves quality of Life and PHQ9 Scores as seen by post scores and/or verbalization of changes             Quality of Life Scores:  Quality of Life - 05/23/23 0842       Quality of Life   Select Quality of Life      Quality of Life Scores   Health/Function Pre 23.2 %    Socioeconomic Pre 18 %    Psych/Spiritual Pre 22.29 %    Family Pre 18 %    GLOBAL Pre 21.27 %            Scores of 19 and below usually indicate a poorer quality of life in these areas.  A difference of  2-3 points is a clinically meaningful difference.  A difference of 2-3 points in the total score of the Quality of Life Index has been associated with significant improvement in overall quality of life, self-image, physical symptoms, and general health in studies assessing change in quality of life.  PHQ-9: Review Flowsheet  More data exists      05/23/2023 06/14/2021 05/11/2020 09/12/2018 08/29/2017  Depression screen PHQ  2/9  Decreased Interest 0 0 0 0 0  Down, Depressed, Hopeless 0 0 0 0 0  PHQ - 2 Score 0 0 0 0 0  Altered sleeping 2 - 3 2 1   Tired, decreased energy 1 - - 1 1  Change in appetite 1 - 0 0 0  Feeling bad or failure about yourself  0 - 0 0 0  Trouble concentrating 0 - 0 0 0  Moving slowly or fidgety/restless 0 - 0 0 0  Suicidal thoughts 0 - 0 0 0  PHQ-9 Score 4 - 3 3 2   Difficult doing work/chores Not difficult at all - Not difficult at all Not difficult at all Not difficult at all   Interpretation of Total Score  Total Score Depression Severity:  1-4 = Minimal depression, 5-9 = Mild depression, 10-14 = Moderate depression, 15-19 = Moderately severe depression, 20-27 = Severe depression   Psychosocial Evaluation and Intervention:   Psychosocial Re-Evaluation:   Psychosocial Discharge (Final Psychosocial Re-Evaluation):   Vocational Rehabilitation: Provide vocational rehab assistance to qualifying candidates.   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            Education: Education Goals: Education classes will be provided on a weekly basis, covering required topics. Participant  will state understanding/return demonstration of topics presented.     Core Videos: Exercise    Move It!  Clinical staff conducted group or individual video education with verbal and written material and guidebook.  Patient learns the recommended Pritikin exercise program. Exercise with the goal of living a long, healthy life. Some of the health benefits of exercise include controlled diabetes, healthier blood pressure levels, improved cholesterol levels, improved heart and lung capacity, improved sleep, and better body composition. Everyone should speak  with their doctor before starting or changing an exercise routine.  Biomechanical Limitations Clinical staff conducted group or individual video education with verbal and written material and guidebook.  Patient learns how biomechanical limitations can impact exercise and how we can mitigate and possibly overcome limitations to have an impactful and balanced exercise routine.  Body Composition Clinical staff conducted group or individual video education with verbal and written material and guidebook.  Patient learns that body composition (ratio of muscle mass to fat mass) is a key component to assessing overall fitness, rather than body weight alone. Increased fat mass, especially visceral belly fat, can put [place] at increased risk for metabolic syndrome, type 2 diabetes, heart disease, and even death. It is recommended to combine diet and exercise (cardiovascular and resistance training) to improve your body composition. Seek guidance from your physician and exercise physiologist before implementing an exercise routine.  Exercise Action Plan Clinical staff conducted group or individual video education with verbal and written material and guidebook.  Patient learns the recommended strategies to achieve and enjoy long-term exercise adherence, including variety, self-motivation, self-efficacy, and positive decision making. Benefits of exercise include fitness, good health, weight management, more energy, better sleep, less stress, and overall well-being.  Medical   Heart Disease Risk Reduction Clinical staff conducted group or individual video education with verbal and written material and guidebook.  Patient learns our heart is our most vital organ as it circulates oxygen, nutrients, white blood cells, and hormones throughout the entire body, and carries waste away. Data supports a plant-based eating plan like the Pritikin Program for its effectiveness in slowing progression of and reversing heart  disease. The video provides a number of recommendations to address heart disease.   Metabolic Syndrome and Belly Fat  Clinical staff conducted group or individual video education with verbal and written material and guidebook.  Patient learns what metabolic syndrome is, how it leads to heart disease, and how one can reverse it and keep it from coming back. You have metabolic syndrome if you have 3 of the following 5 criteria: abdominal obesity, high blood pressure, high triglycerides, low HDL cholesterol, and high blood sugar.  Hypertension and Heart Disease Clinical staff conducted group or individual video education with verbal and written material and guidebook.  Patient learns that high blood pressure, or hypertension, is very common in the [place]. Hypertension is largely due to excessive salt intake, but other important risk factors include being overweight, physical inactivity, drinking too much alcohol, smoking, and not eating enough potassium from fruits and vegetables. High blood pressure is a leading risk factor for heart attack, stroke, congestive heart failure, dementia, kidney failure, and premature death. Long-term effects of excessive salt intake include stiffening of the arteries and thickening of heart muscle and organ damage. Recommendations include ways to reduce hypertension and the risk of heart disease.  Diseases of Our Time - Focusing on Diabetes Clinical staff conducted group or individual video education with verbal and written material and guidebook.  Patient  learns why the best way to stop diseases of our time is prevention, through food and other lifestyle changes. Medicine (such as prescription pills and surgeries) is often only a Band-Aid on the problem, not a long-term solution. Most common diseases of our time include obesity, type 2 diabetes, hypertension, heart disease, and cancer. The Pritikin Program is recommended and has been proven to help reduce, reverse,  and/or prevent the damaging effects of metabolic syndrome.  Nutrition   Overview of the Pritikin Eating Plan  Clinical staff conducted group or individual video education with verbal and written material and guidebook.  Patient learns about the Pritikin Eating Plan for disease risk reduction. The Pritikin Eating Plan emphasizes a wide variety of unrefined, minimally-processed carbohydrates, like fruits, vegetables, whole grains, and legumes. Go, Caution, and Stop food choices are explained. Plant-based and lean animal proteins are emphasized. Rationale provided for low sodium intake for blood pressure control, low added sugars for blood sugar stabilization, and low added fats and oils for coronary artery disease risk reduction and weight management.  Calorie Density  Clinical staff conducted group or individual video education with verbal and written material and guidebook.  Patient learns about calorie density and how it impacts the Pritikin Eating Plan. Knowing the characteristics of the food you choose will help you decide whether those foods will lead to weight gain or weight loss, and whether you want to consume more or less of them. Weight loss is usually a side effect of the Pritikin Eating Plan because of its focus on low calorie-dense foods.  Label Reading  Clinical staff conducted group or individual video education with verbal and written material and guidebook.  Patient learns about the Pritikin recommended label reading guidelines and corresponding recommendations regarding calorie density, added sugars, sodium content, and whole grains.  Dining Out - Part 1  Clinical staff conducted group or individual video education with verbal and written material and guidebook.  Patient learns that restaurant meals can be sabotaging because they can be so high in calories, fat, sodium, and/or sugar. Patient learns recommended strategies on how to positively address this and avoid unhealthy  pitfalls.  Facts on Fats  Clinical staff conducted group or individual video education with verbal and written material and guidebook.  Patient learns that lifestyle modifications can be just as effective, if not more so, as many medications for lowering your risk of heart disease. A Pritikin lifestyle can help to reduce your risk of inflammation and atherosclerosis (cholesterol build-up, or plaque, in the artery walls). Lifestyle interventions such as dietary choices and physical activity address the cause of atherosclerosis. A review of the types of fats and their impact on blood cholesterol levels, along with dietary recommendations to reduce fat intake is also included.  Nutrition Action Plan  Clinical staff conducted group or individual video education with verbal and written material and guidebook.  Patient learns how to incorporate Pritikin recommendations into their lifestyle. Recommendations include planning and keeping personal health goals in mind as an important part of their success.  Healthy Mind-Set    Healthy Minds, Bodies, Hearts  Clinical staff conducted group or individual video education with verbal and written material and guidebook.  Patient learns how to identify when they are stressed. Video will discuss the impact of that stress, as well as the many benefits of stress management. Patient will also be introduced to stress management techniques. The way we think, act, and feel has an impact on our hearts.  How Our Thoughts Can  Heal Our Hearts  Clinical staff conducted group or individual video education with verbal and written material and guidebook.  Patient learns that negative thoughts can cause depression and anxiety. This can result in negative lifestyle behavior and serious health problems. Cognitive behavioral therapy is an effective method to help control our thoughts in order to change and improve our emotional outlook.  Additional Videos:  Exercise    Improving  Performance  Clinical staff conducted group or individual video education with verbal and written material and guidebook.  Patient learns to use a non-linear approach by alternating intensity levels and lengths of time spent exercising to help burn more calories and lose more body fat. Cardiovascular exercise helps improve heart health, metabolism, hormonal balance, blood sugar control, and recovery from fatigue. Resistance training improves strength, endurance, balance, coordination, reaction time, metabolism, and muscle mass. Flexibility exercise improves circulation, posture, and balance. Seek guidance from your physician and exercise physiologist before implementing an exercise routine and learn your capabilities and proper form for all exercise.  Introduction to Yoga  Clinical staff conducted group or individual video education with verbal and written material and guidebook.  Patient learns about yoga, a discipline of the coming together of mind, breath, and body. The benefits of yoga include improved flexibility, improved range of motion, better posture and core strength, increased lung function, weight loss, and positive self-image. Yoga's heart health benefits include lowered blood pressure, healthier heart rate, decreased cholesterol and triglyceride levels, improved immune function, and reduced stress. Seek guidance from your physician and exercise physiologist before implementing an exercise routine and learn your capabilities and proper form for all exercise.  Medical   Aging: Enhancing Your Quality of Life  Clinical staff conducted group or individual video education with verbal and written material and guidebook.  Patient learns key strategies and recommendations to stay in good physical health and enhance quality of life, such as prevention strategies, having an advocate, securing a Health Care Proxy and Power of Attorney, and keeping a list of medications and system for tracking them. It  also discusses how to avoid risk for bone loss.  Biology of Weight Control  Clinical staff conducted group or individual video education with verbal and written material and guidebook.  Patient learns that weight gain occurs because we consume more calories than we burn (eating more, moving less). Even if your body weight is normal, you may have higher ratios of fat compared to muscle mass. Too much body fat puts you at increased risk for cardiovascular disease, heart attack, stroke, type 2 diabetes, and obesity-related cancers. In addition to exercise, following the Pritikin Eating Plan can help reduce your risk.  Decoding Lab Results  Clinical staff conducted group or individual video education with verbal and written material and guidebook.  Patient learns that lab test reflects one measurement whose values change over time and are influenced by many factors, including medication, stress, sleep, exercise, food, hydration, pre-existing medical conditions, and more. It is recommended to use the knowledge from this video to become more involved with your lab results and evaluate your numbers to speak with your doctor.   Diseases of Our Time - Overview  Clinical staff conducted group or individual video education with verbal and written material and guidebook.  Patient learns that according to the CDC, 50% to 70% of chronic diseases (such as obesity, type 2 diabetes, elevated lipids, hypertension, and heart disease) are avoidable through lifestyle improvements including healthier food choices, listening to satiety cues, and increased  physical activity.  Sleep Disorders Clinical staff conducted group or individual video education with verbal and written material and guidebook.  Patient learns how good quality and duration of sleep are important to overall health and well-being. Patient also learns about sleep disorders and how they impact health along with recommendations to address them, including  discussing with a physician.  Nutrition  Dining Out - Part 2 Clinical staff conducted group or individual video education with verbal and written material and guidebook.  Patient learns how to plan ahead and communicate in order to maximize their dining experience in a healthy and nutritious manner. Included are recommended food choices based on the type of restaurant the patient is visiting.   Fueling a Banker conducted group or individual video education with verbal and written material and guidebook.  There is a strong connection between our food choices and our health. Diseases like obesity and type 2 diabetes are very prevalent and are in large-part due to lifestyle choices. The Pritikin Eating Plan provides plenty of food and hunger-curbing satisfaction. It is easy to follow, affordable, and helps reduce health risks.  Menu Workshop  Clinical staff conducted group or individual video education with verbal and written material and guidebook.  Patient learns that restaurant meals can sabotage health goals because they are often packed with calories, fat, sodium, and sugar. Recommendations include strategies to plan ahead and to communicate with the manager, chef, or server to help order a healthier meal.  Planning Your Eating Strategy  Clinical staff conducted group or individual video education with verbal and written material and guidebook.  Patient learns about the Pritikin Eating Plan and its benefit of reducing the risk of disease. The Pritikin Eating Plan does not focus on calories. Instead, it emphasizes high-quality, nutrient-rich foods. By knowing the characteristics of the foods, we choose, we can determine their calorie density and make informed decisions.  Targeting Your Nutrition Priorities  Clinical staff conducted group or individual video education with verbal and written material and guidebook.  Patient learns that lifestyle habits have a tremendous  impact on disease risk and progression. This video provides eating and physical activity recommendations based on your personal health goals, such as reducing LDL cholesterol, losing weight, preventing or controlling type 2 diabetes, and reducing high blood pressure.  Vitamins and Minerals  Clinical staff conducted group or individual video education with verbal and written material and guidebook.  Patient learns different ways to obtain key vitamins and minerals, including through a recommended healthy diet. It is important to discuss all supplements you take with your doctor.   Healthy Mind-Set    Smoking Cessation  Clinical staff conducted group or individual video education with verbal and written material and guidebook.  Patient learns that cigarette smoking and tobacco addiction pose a serious health risk which affects millions of people. Stopping smoking will significantly reduce the risk of heart disease, lung disease, and many forms of cancer. Recommended strategies for quitting are covered, including working with your doctor to develop a successful plan.  Culinary   Becoming a Set designer conducted group or individual video education with verbal and written material and guidebook.  Patient learns that cooking at home can be healthy, cost-effective, quick, and puts them in control. Keys to cooking healthy recipes will include looking at your recipe, assessing your equipment needs, planning ahead, making it simple, choosing cost-effective seasonal ingredients, and limiting the use of added fats, salts, and sugars.  Cooking -  Breakfast and Snacks  Clinical staff conducted group or individual video education with verbal and written material and guidebook.  Patient learns how important breakfast is to satiety and nutrition through the entire day. Recommendations include key foods to eat during breakfast to help stabilize blood sugar levels and to prevent overeating at meals  later in the day. Planning ahead is also a key component.  Cooking - Educational psychologist conducted group or individual video education with verbal and written material and guidebook.  Patient learns eating strategies to improve overall health, including an approach to cook more at home. Recommendations include thinking of animal protein as a side on your plate rather than center stage and focusing instead on lower calorie dense options like vegetables, fruits, whole grains, and plant-based proteins, such as beans. Making sauces in large quantities to freeze for later and leaving the skin on your vegetables are also recommended to maximize your experience.  Cooking - Healthy Salads and Dressing Clinical staff conducted group or individual video education with verbal and written material and guidebook.  Patient learns that vegetables, fruits, whole grains, and legumes are the foundations of the Pritikin Eating Plan. Recommendations include how to incorporate each of these in flavorful and healthy salads, and how to create homemade salad dressings. Proper handling of ingredients is also covered. Cooking - Soups and [employer] - Soups and Desserts Clinical staff conducted group or individual video education with verbal and written material and guidebook.  Patient learns that Pritikin soups and desserts make for easy, nutritious, and delicious snacks and meal components that are low in sodium, fat, sugar, and calorie density, while high in vitamins, minerals, and filling fiber. Recommendations include simple and healthy ideas for soups and desserts.   Overview     The Pritikin Solution Program Overview Clinical staff conducted group or individual video education with verbal and written material and guidebook.  Patient learns that the results of the Pritikin Program have been documented in more than 100 articles published in peer-reviewed journals, and the benefits include reducing  risk factors for (and, in some cases, even reversing) high cholesterol, high blood pressure, type 2 diabetes, obesity, and more! An overview of the three key pillars of the Pritikin Program will be covered: eating well, doing regular exercise, and having a healthy mind-set.  WORKSHOPS  Exercise: Exercise Basics: Building Your Action Plan Clinical staff led group instruction and group discussion with PowerPoint presentation and patient guidebook. To enhance the learning environment the use of posters, models and videos may be added. At the conclusion of this workshop, patients will comprehend the difference between physical activity and exercise, as well as the benefits of incorporating both, into their routine. Patients will understand the FITT (Frequency, Intensity, Time, and Type) principle and how to use it to build an exercise action plan. In addition, safety concerns and other considerations for exercise and cardiac rehab will be addressed by the presenter. The purpose of this lesson is to promote a comprehensive and effective weekly exercise routine in order to improve patients' overall level of fitness.   Managing Heart Disease: Your Path to a Healthier Heart Clinical staff led group instruction and group discussion with PowerPoint presentation and patient guidebook. To enhance the learning environment the use of posters, models and videos may be added.At the conclusion of this workshop, patients will understand the anatomy and physiology of the heart. Additionally, they will understand how Pritikin's three pillars impact the risk factors, the progression,  and the management of heart disease.  The purpose of this lesson is to provide a high-level overview of the heart, heart disease, and how the Pritikin lifestyle positively impacts risk factors.  Exercise Biomechanics Clinical staff led group instruction and group discussion with PowerPoint presentation and patient guidebook. To enhance  the learning environment the use of posters, models and videos may be added. Patients will learn how the structural parts of their bodies function and how these functions impact their daily activities, movement, and exercise. Patients will learn how to promote a neutral spine, learn how to manage pain, and identify ways to improve their physical movement in order to promote healthy living. The purpose of this lesson is to expose patients to common physical limitations that impact physical activity. Participants will learn practical ways to adapt and manage aches and pains, and to minimize their effect on regular exercise. Patients will learn how to maintain good posture while sitting, walking, and lifting.  Balance Training and Fall Prevention  Clinical staff led group instruction and group discussion with PowerPoint presentation and patient guidebook. To enhance the learning environment the use of posters, models and videos may be added. At the conclusion of this workshop, patients will understand the importance of their sensorimotor skills (vision, proprioception, and the vestibular system) in maintaining their ability to balance as they age. Patients will apply a variety of balancing exercises that are appropriate for their current level of function. Patients will understand the common causes for poor balance, possible solutions to these problems, and ways to modify their physical environment in order to minimize their fall risk. The purpose of this lesson is to teach patients about the importance of maintaining balance as they age and ways to minimize their risk of falling.  WORKSHOPS   Nutrition:  Fueling a Ship broker led group instruction and group discussion with PowerPoint presentation and patient guidebook. To enhance the learning environment the use of posters, models and videos may be added. Patients will review the foundational principles of the Pritikin Eating Plan  and understand what constitutes a serving size in each of the food groups. Patients will also learn Pritikin-friendly foods that are better choices when away from home and review make-ahead meal and snack options. Calorie density will be reviewed and applied to three nutrition priorities: weight maintenance, weight loss, and weight gain. The purpose of this lesson is to reinforce (in a group setting) the key concepts around what patients are recommended to eat and how to apply these guidelines when away from home by planning and selecting Pritikin-friendly options. Patients will understand how calorie density may be adjusted for different weight management goals.  Mindful Eating  Clinical staff led group instruction and group discussion with PowerPoint presentation and patient guidebook. To enhance the learning environment the use of posters, models and videos may be added. Patients will briefly review the concepts of the Pritikin Eating Plan and the importance of low-calorie dense foods. The concept of mindful eating will be introduced as well as the importance of paying attention to internal hunger signals. Triggers for non-hunger eating and techniques for dealing with triggers will be explored. The purpose of this lesson is to provide patients with the opportunity to review the basic principles of the Pritikin Eating Plan, discuss the value of eating mindfully and how to measure internal cues of hunger and fullness using the Hunger Scale. Patients will also discuss reasons for non-hunger eating and learn strategies to use for controlling emotional eating.  Targeting Your Nutrition Priorities Clinical staff led group instruction and group discussion with PowerPoint presentation and patient guidebook. To enhance the learning environment the use of posters, models and videos may be added. Patients will learn how to determine their genetic susceptibility to disease by reviewing their family history. Patients  will gain insight into the importance of diet as part of an overall healthy lifestyle in mitigating the impact of genetics and other environmental insults. The purpose of this lesson is to provide patients with the opportunity to assess their personal nutrition priorities by looking at their family history, their own health history and current risk factors. Patients will also be able to discuss ways of prioritizing and modifying the Pritikin Eating Plan for their highest risk areas  Menu  Clinical staff led group instruction and group discussion with PowerPoint presentation and patient guidebook. To enhance the learning environment the use of posters, models and videos may be added. Using menus brought in from [employer], or printed from [employer], patients will apply the Pritikin dining out guidelines that were presented in the [employer] video. Patients will also be able to practice these guidelines in a variety of provided scenarios. The purpose of this lesson is to provide patients with the opportunity to practice hands-on learning of the Pritikin Dining Out guidelines with actual menus and practice scenarios.  Label Reading Clinical staff led group instruction and group discussion with PowerPoint presentation and patient guidebook. To enhance the learning environment the use of posters, models and videos may be added. Patients will review and discuss the Pritikin label reading guidelines presented in Pritikin's Label Reading Educational series video. Using fool labels brought in from local grocery stores and markets, patients will apply the label reading guidelines and determine if the packaged food meet the Pritikin guidelines. The purpose of this lesson is to provide patients with the opportunity to review, discuss, and practice hands-on learning of the Pritikin Label Reading guidelines with actual packaged food labels. Cooking School  Pritikin's [employer]  are designed to teach patients ways to prepare quick, simple, and affordable recipes at home. The importance of nutrition's role in chronic disease risk reduction is reflected in its emphasis in the overall Pritikin program. By learning how to prepare essential core Pritikin Eating Plan recipes, patients will increase control over what they eat; be able to customize the flavor of foods without the use of added salt, sugar, or fat; and improve the quality of the food they consume. By learning a set of core recipes which are easily assembled, quickly prepared, and affordable, patients are more likely to prepare more healthy foods at home. These workshops focus on convenient breakfasts, simple entres, side dishes, and desserts which can be prepared with minimal effort and are consistent with nutrition recommendations for cardiovascular risk reduction. Cooking [employer] are taught by a Armed forces logistics/support/administrative officer (RD) who has been trained by the [employer]. The chef or RD has a clear understanding of the importance of minimizing - if not completely eliminating - added fat, sugar, and sodium in recipes. Throughout the series of Cooking School Workshop sessions, patients will learn about healthy ingredients and efficient methods of cooking to build confidence in their capability to prepare    Cooking School weekly topics:  Adding Flavor- Sodium-Free  Fast and Healthy Breakfasts  Powerhouse Plant-Based Proteins  Satisfying Salads and Dressings  Simple Sides and Sauces  International Cuisine-Spotlight on the [employer]  Savory Soups  Efficiency Cooking - Meals in a Hydrologist and Snacks  Comforting Weekend Breakfasts  One-Pot Wonders   Fast [employer] Your Pritikin Plate  WORKSHOPS   Healthy Mindset (Psychosocial):  Focused Goals, Sustainable Changes Clinical staff led group instruction and group discussion  with PowerPoint presentation and patient guidebook. To enhance the learning environment the use of posters, models and videos may be added. Patients will be able to apply effective goal setting strategies to establish at least one personal goal, and then take consistent, meaningful action toward that goal. They will learn to identify common barriers to achieving personal goals and develop strategies to overcome them. Patients will also gain an understanding of how our mind-set can impact our ability to achieve goals and the importance of cultivating a positive and growth-oriented mind-set. The purpose of this lesson is to provide patients with a deeper understanding of how to set and achieve personal goals, as well as the tools and strategies needed to overcome common obstacles which may arise along the way.  From Head to Heart: The Power of a Healthy Outlook  Clinical staff led group instruction and group discussion with PowerPoint presentation and patient guidebook. To enhance the learning environment the use of posters, models and videos may be added. Patients will be able to recognize and describe the impact of emotions and mood on physical health. They will discover the importance of self-care and explore self-care practices which may work for them. Patients will also learn how to utilize the 4 C's to cultivate a healthier outlook and better manage stress and challenges. The purpose of this lesson is to demonstrate to patients how a healthy outlook is an essential part of maintaining good health, especially as they continue their cardiac rehab journey.  Healthy Sleep for a Healthy Heart Clinical staff led group instruction and group discussion with PowerPoint presentation and patient guidebook. To enhance the learning environment the use of posters, models and videos may be added. At the conclusion of this workshop, patients will be able to demonstrate knowledge of the importance of sleep to overall  health, well-being, and quality of life. They will understand the symptoms of, and treatments for, common sleep disorders. Patients will also be able to identify daytime and nighttime behaviors which impact sleep, and they will be able to apply these tools to help manage sleep-related challenges. The purpose of this lesson is to provide patients with a general overview of sleep and outline the importance of quality sleep. Patients will learn about a few of the most common sleep disorders. Patients will also be introduced to the concept of "sleep hygiene," and discover ways to self-manage certain sleeping problems through simple daily behavior changes. Finally, the workshop will motivate patients by clarifying the links between quality sleep and their goals of heart-healthy living.   Recognizing and Reducing Stress Clinical staff led group instruction and group discussion with PowerPoint presentation and patient guidebook. To enhance the learning environment the use of posters, models and videos may be added. At the conclusion of this workshop, patients will be able to understand the types of stress reactions, differentiate between acute and chronic stress, and recognize the impact that chronic stress has on their health. They will also be able to apply different coping mechanisms, such as reframing negative self-talk. Patients will have the opportunity to practice a variety of stress management techniques, such as deep abdominal breathing, progressive muscle relaxation, and/or guided imagery.  The purpose of this lesson is to educate patients on the role of stress in their lives and to provide healthy techniques for coping with it.  Learning Barriers/Preferences:  Learning Barriers/Preferences - 05/23/23 8295       Learning Barriers/Preferences   Learning Barriers Hearing   HOH   Learning Preferences Written Material;Individual Instruction;Verbal Instruction             Education  Topics:  Knowledge Questionnaire Score:  Knowledge Questionnaire Score - 05/23/23 0832       Knowledge Questionnaire Score   Pre Score 21/24             Core Components/Risk Factors/Patient Goals at Admission:  Personal Goals and Risk Factors at Admission - 05/23/23 0758       Core Components/Risk Factors/Patient Goals on Admission    Weight Management Yes;Obesity;Weight Loss    Intervention Weight Management/Obesity: Establish reasonable short term and long term weight goals.;Obesity: Provide education and appropriate resources to help participant work on and attain dietary goals.    Admit Weight 194 lb 14.2 oz (88.4 kg)    Expected Outcomes Short Term: Continue to assess and modify interventions until short term weight is achieved;Long Term: Adherence to nutrition and physical activity/exercise program aimed toward attainment of established weight goal;Weight Loss: Understanding of general recommendations for a balanced deficit meal plan, which promotes 1-2 lb weight loss per week and includes a negative energy balance of [phone] kcal/d    Diabetes Yes    Intervention Provide education about signs/symptoms and action to take for hypo/hyperglycemia.;Provide education about proper nutrition, including hydration, and aerobic/resistive exercise prescription along with prescribed medications to achieve blood glucose in normal ranges: Fasting glucose 65-99 mg/dL    Expected Outcomes Short Term: Participant verbalizes understanding of the signs/symptoms and immediate care of hyper/hypoglycemia, proper foot care and importance of medication, aerobic/resistive exercise and nutrition plan for blood glucose control.;Long Term: Attainment of HbA1C < 7%.    Hypertension Yes    Intervention Provide education on lifestyle modifcations including regular physical activity/exercise, weight management, moderate sodium restriction and increased consumption of fresh fruit, vegetables, and low fat dairy,  alcohol moderation, and smoking cessation.;Monitor prescription use compliance.    Expected Outcomes Short Term: Continued assessment and intervention until BP is < 140/99mm HG in hypertensive participants. < 130/67mm HG in hypertensive participants with diabetes, heart failure or chronic kidney disease.;Long Term: Maintenance of blood pressure at goal levels.    Lipids Yes    Intervention Provide education and support for participant on nutrition & aerobic/resistive exercise along with prescribed medications to achieve LDL 70mg , HDL >40mg .    Expected Outcomes Short Term: Participant states understanding of desired cholesterol values and is compliant with medications prescribed. Participant is following exercise prescription and nutrition guidelines.;Long Term: Cholesterol controlled with medications as prescribed, with individualized exercise RX and with personalized nutrition plan. Value goals: LDL < 70mg , HDL > 40 mg.    Stress Yes    Intervention Offer individual and/or small group education and counseling on adjustment to heart disease, stress management and health-related lifestyle change. Teach and support self-help strategies.;Refer participants experiencing significant psychosocial distress to appropriate mental health specialists for further evaluation and treatment. When possible, include family members and significant others in education/counseling sessions.    Expected Outcomes Short Term: Participant demonstrates changes in health-related behavior, relaxation and other stress management skills, ability to obtain effective social support, and compliance with psychotropic medications if prescribed.;Long Term: Emotional wellbeing is indicated by absence of  clinically significant psychosocial distress or social isolation.             Core Components/Risk Factors/Patient Goals Review:    Core Components/Risk Factors/Patient Goals at Discharge (Final Review):    ITP Comments:  ITP  Comments     Row Name 05/23/23 0749           ITP Comments Medical Director - Dr. Armanda Magic, MD. Introduction to the [facility] / Intensive Cardiac Rehab. Reviewed initial orientation folder with Halle.                Comments: Azrael attended orientation for the cardiac rehabilitation program on  05/23/2023  to perform initial intake and exercise walk test. She was introduced to the [employer] education and orientation packet was reviewed. Completed 6-minute walk test, measurements, initial ITP, and exercise prescription. Vital signs stable. Telemetry-normal sinus rhythm, asymptomatic.   Service time was from 747 to 942. Artist Pais, MS, ACSM CEP 05/23/2023 1018

## 2023-05-23 NOTE — Progress Notes (Signed)
 Cardiac Rehab Medication Review by a Nurse  Does the patient  feel that his/her medications are working for him/her?  yes  Has the patient been experiencing any side effects to the medications prescribed?  no  Does the patient measure his/her own blood pressure or blood glucose at home?  yes   Does the patient have any problems obtaining medications due to transportation or finances?   no  Understanding of regimen: good Understanding of indications: good Potential of compliance: good    Nurse comments: [name] is taking her medications as prescribed and has a good understanding of what her medications are for. [name] has a CGM. [name] has a BP cuff/ monitor. [name] does not check her blood pressures at home.     Arta [facility] RN 05/23/2023 8:02 AM

## 2023-05-24 ENCOUNTER — Ambulatory Visit (HOSPITAL_COMMUNITY)

## 2023-05-25 ENCOUNTER — Other Ambulatory Visit (HOSPITAL_BASED_OUTPATIENT_CLINIC_OR_DEPARTMENT_OTHER): Payer: Self-pay

## 2023-05-25 ENCOUNTER — Encounter (HOSPITAL_BASED_OUTPATIENT_CLINIC_OR_DEPARTMENT_OTHER): Payer: Self-pay

## 2023-05-25 ENCOUNTER — Ambulatory Visit: Admitting: Nurse Practitioner

## 2023-05-25 ENCOUNTER — Other Ambulatory Visit: Payer: Self-pay | Admitting: Nurse Practitioner

## 2023-05-25 ENCOUNTER — Encounter: Payer: Self-pay | Admitting: Nurse Practitioner

## 2023-05-25 VITALS — BP 124/82 | HR 70 | Wt 195.8 lb

## 2023-05-25 DIAGNOSIS — E559 Vitamin D deficiency, unspecified: Secondary | ICD-10-CM

## 2023-05-25 DIAGNOSIS — E118 Type 2 diabetes mellitus with unspecified complications: Secondary | ICD-10-CM | POA: Diagnosis not present

## 2023-05-25 DIAGNOSIS — K2960 Other gastritis without bleeding: Secondary | ICD-10-CM

## 2023-05-25 DIAGNOSIS — E785 Hyperlipidemia, unspecified: Secondary | ICD-10-CM

## 2023-05-25 DIAGNOSIS — E1159 Type 2 diabetes mellitus with other circulatory complications: Secondary | ICD-10-CM | POA: Diagnosis not present

## 2023-05-25 DIAGNOSIS — I6523 Occlusion and stenosis of bilateral carotid arteries: Secondary | ICD-10-CM

## 2023-05-25 DIAGNOSIS — I25110 Atherosclerotic heart disease of native coronary artery with unstable angina pectoris: Secondary | ICD-10-CM

## 2023-05-25 DIAGNOSIS — E1169 Type 2 diabetes mellitus with other specified complication: Secondary | ICD-10-CM

## 2023-05-25 DIAGNOSIS — I10 Essential (primary) hypertension: Secondary | ICD-10-CM | POA: Diagnosis not present

## 2023-05-25 DIAGNOSIS — E119 Type 2 diabetes mellitus without complications: Secondary | ICD-10-CM

## 2023-05-25 DIAGNOSIS — E890 Postprocedural hypothyroidism: Secondary | ICD-10-CM

## 2023-05-25 MED ORDER — ROSUVASTATIN CALCIUM 40 MG PO TABS
40.0000 mg | ORAL_TABLET | Freq: Every day | ORAL | 3 refills | Status: AC
Start: 2023-05-25 — End: ?
  Filled 2023-05-25 – 2023-06-23 (×2): qty 90, 90d supply, fill #0
  Filled 2023-10-20: qty 90, 90d supply, fill #1
  Filled 2024-01-08: qty 90, 90d supply, fill #2

## 2023-05-25 MED ORDER — PANTOPRAZOLE SODIUM 40 MG PO TBEC
40.0000 mg | DELAYED_RELEASE_TABLET | Freq: Every day | ORAL | 3 refills | Status: AC
  Filled 2023-05-25 – 2023-06-23 (×2): qty 90, 90d supply, fill #0
  Filled 2023-10-20: qty 90, 90d supply, fill #1
  Filled 2024-01-08: qty 90, 90d supply, fill #2

## 2023-05-25 MED ORDER — DEXCOM G7 SENSOR MISC
3 refills | Status: AC
  Filled 2023-05-25: qty 9, fill #0
  Filled 2023-06-02: qty 9, 90d supply, fill #0
  Filled 2023-06-13: qty 2, 20d supply, fill #0
  Filled 2023-06-23: qty 2, 20d supply, fill #1
  Filled 2023-08-10: qty 2, 20d supply, fill #2
  Filled 2023-09-01: qty 2, 20d supply, fill #3
  Filled 2023-09-15: qty 3, 30d supply, fill #3
  Filled 2023-10-28: qty 3, 30d supply, fill #4
  Filled 2023-11-17 – 2023-11-20 (×2): qty 3, 30d supply, fill #5
  Filled 2023-12-29: qty 3, 30d supply, fill #6
  Filled 2024-02-06: qty 3, 30d supply, fill #7

## 2023-05-25 MED ORDER — METOPROLOL SUCCINATE ER 100 MG PO TB24
100.0000 mg | ORAL_TABLET | Freq: Every day | ORAL | 3 refills | Status: AC
  Filled 2023-05-25 – 2023-06-23 (×2): qty 90, 90d supply, fill #0
  Filled 2023-11-03 – 2023-11-04 (×2): qty 90, 90d supply, fill #1
  Filled 2024-02-06: qty 90, 90d supply, fill #2

## 2023-05-25 NOTE — Assessment & Plan Note (Signed)
 Coronary artery disease post-bypass surgery. She reports improvement and is participating in cardiac rehabilitation. Heart function is good. She carries nitroglycerin for angina, which has not been needed recently. - Continue cardiac rehabilitation - Carry nitroglycerin for angina - Monitor heart health and follow up with cardiologist

## 2023-05-25 NOTE — Patient Instructions (Signed)
 Get your labs at your earliest convenience while fasting at any [facility]. I will let you know if we need to change anything.

## 2023-05-25 NOTE — Assessment & Plan Note (Signed)
>>  ASSESSMENT AND PLAN FOR TYPE 2 DIABETES MELLITUS WITH COMPLICATIONS (HCC) WRITTEN ON 05/25/2023  1:26 PM BY Siboney Requejo E, NP  Blood sugars are reportedly well controlled at this time. She is doing very well with diet and starting her cardiac rehab exercise which will be very helpful. Will monitor labs today

## 2023-05-25 NOTE — Progress Notes (Signed)
 [name], DNP, AGNP-c [facility]  [address] [place], [place] [address] [phone]  ESTABLISHED PATIENT- Chronic Health and/or Follow-Up Visit  Blood pressure 124/82, pulse 70, weight 195 lb 12.8 oz (88.8 kg).    [name] is a 63 y.o. year old female presenting today for evaluation and management of chronic conditions.   History of Present Illness [name] is a 63 year old female with a history of coronary artery disease who presents for follow-up.  She has a history of coronary artery disease, having undergone bypass surgery in February. She is currently participating in cardiac rehabilitation three times a week and reports feeling better.   Her blood sugar levels are within the expected limits with the use of Mounjaro. She does not report any recent hypoglycemic episodes.   She reports overall she is feeling very well and denies any concerns today.   All ROS negative with exception of what is listed above.   PHYSICAL EXAM Physical Exam Vitals and nursing note reviewed.  Constitutional:      Appearance: Normal appearance.  HENT:     Head: Normocephalic.  Eyes:     Pupils: Pupils are equal, round, and reactive to light.  Cardiovascular:     Rate and Rhythm: Normal rate and regular rhythm.     Pulses: Normal pulses.     Heart sounds: Normal heart sounds.  Pulmonary:     Effort: Pulmonary effort is normal.     Breath sounds: Normal breath sounds.  Musculoskeletal:        General: Normal range of motion.     Cervical back: Normal range of motion.  Skin:    General: Skin is warm.  Neurological:     General: No focal deficit present.     Mental Status: She is alert and oriented to person, place, and time.  Psychiatric:        Mood and Affect: Mood normal.      PLAN Problem List Items Addressed This Visit     Hypothyroidism   Repeat labs today for monitoring. No alarm symptoms at this time.       Relevant Medications    metoprolol succinate (TOPROL-XL) 100 MG 24 hr tablet   Other Relevant Orders   CBC with Differential/Platelet   Comprehensive metabolic panel with GFR   TSH   Hyperlipidemia associated with type 2 diabetes mellitus (HCC)   Currently managed with rosuvastatin. Recommendation for Repatha. We will get her ldl and lipoprotein A today as requested by cardiology and follow.       Relevant Medications   metoprolol succinate (TOPROL-XL) 100 MG 24 hr tablet   rosuvastatin (CRESTOR) 40 MG tablet   Other Relevant Orders   Hemoglobin A1c   CBC with Differential/Platelet   Comprehensive metabolic panel with GFR   Lipoprotein A (LPA)   Lipid panel   Hypertension complicating diabetes (HCC)   Blood pressure well controlled today with no concerning symptoms. We will recheck labs.       Relevant Medications   metoprolol succinate (TOPROL-XL) 100 MG 24 hr tablet   rosuvastatin (CRESTOR) 40 MG tablet   Other Relevant Orders   Hemoglobin A1c   CBC with Differential/Platelet   Comprehensive metabolic panel with GFR   Lipoprotein A (LPA)   Lipid panel   Type 2 diabetes mellitus with complications (HCC) - Primary   Blood sugars are reportedly well controlled at this time. She is doing very well with diet and starting her cardiac  rehab exercise which will be very helpful. Will monitor labs today      Relevant Medications   Continuous Glucose Sensor (DEXCOM G7 SENSOR) MISC   rosuvastatin (CRESTOR) 40 MG tablet   Other Relevant Orders   Hemoglobin A1c   CBC with Differential/Platelet   Comprehensive metabolic panel with GFR   Lipoprotein A (LPA)   Lipid panel   Vitamin D deficiency   Repeat labs today      Relevant Orders   Hemoglobin A1c   CBC with Differential/Platelet   Comprehensive metabolic panel with GFR   VITAMIN D 25 Hydroxy (Vit-D Deficiency, Fractures)   Reflux gastritis   refill      Relevant Medications   pantoprazole (PROTONIX) 40 MG tablet   Carotid artery disease  (HCC)   Future carotid artery surgery on the agenda, but nothing planned at this time.      Relevant Medications   metoprolol succinate (TOPROL-XL) 100 MG 24 hr tablet   rosuvastatin (CRESTOR) 40 MG tablet   Other Relevant Orders   Hemoglobin A1c   CBC with Differential/Platelet   Comprehensive metabolic panel with GFR   Lipoprotein A (LPA)   Lipid panel   Coronary artery disease   Coronary artery disease post-bypass surgery. She reports improvement and is participating in cardiac rehabilitation. Heart function is good. She carries nitroglycerin for angina, which has not been needed recently. - Continue cardiac rehabilitation - Carry nitroglycerin for angina - Monitor heart health and follow up with cardiologist       Relevant Medications   metoprolol succinate (TOPROL-XL) 100 MG 24 hr tablet   rosuvastatin (CRESTOR) 40 MG tablet   Other Visit Diagnoses       Essential hypertension       Relevant Medications   metoprolol succinate (TOPROL-XL) 100 MG 24 hr tablet   rosuvastatin (CRESTOR) 40 MG tablet   Other Relevant Orders   Hemoglobin A1c   CBC with Differential/Platelet   Comprehensive metabolic panel with GFR   Lipoprotein A (LPA)   Lipid panel     Type 2 diabetes mellitus with vascular disease (HCC)       Relevant Medications   metoprolol succinate (TOPROL-XL) 100 MG 24 hr tablet   Continuous Glucose Sensor (DEXCOM G7 SENSOR) MISC   rosuvastatin (CRESTOR) 40 MG tablet   Other Relevant Orders   Hemoglobin A1c   CBC with Differential/Platelet   Comprehensive metabolic panel with GFR   Lipoprotein A (LPA)   Lipid panel     Dyslipidemia       Relevant Medications   rosuvastatin (CRESTOR) 40 MG tablet       Return in about 6 months (around 11/24/2023) for CPE. Time: 44 minutes, >50% spent counseling, care coordination, chart review, and documentation.   [name], DNP, AGNP-c

## 2023-05-25 NOTE — Assessment & Plan Note (Signed)
refill 

## 2023-05-25 NOTE — Assessment & Plan Note (Signed)
 Blood pressure well controlled today with no concerning symptoms. We will recheck labs.

## 2023-05-25 NOTE — Assessment & Plan Note (Signed)
 Currently managed with rosuvastatin. Recommendation for Repatha. We will get her ldl and lipoprotein A today as requested by cardiology and follow.

## 2023-05-25 NOTE — Assessment & Plan Note (Signed)
 Repeat labs today for monitoring. No alarm symptoms at this time.

## 2023-05-25 NOTE — Assessment & Plan Note (Signed)
 Blood sugars are reportedly well controlled at this time. She is doing very well with diet and starting her cardiac rehab exercise which will be very helpful. Will monitor labs today

## 2023-05-25 NOTE — Assessment & Plan Note (Signed)
 Repeat labs today

## 2023-05-25 NOTE — Assessment & Plan Note (Signed)
 Future carotid artery surgery on the agenda, but nothing planned at this time.

## 2023-05-26 ENCOUNTER — Other Ambulatory Visit (HOSPITAL_BASED_OUTPATIENT_CLINIC_OR_DEPARTMENT_OTHER): Payer: Self-pay

## 2023-05-26 ENCOUNTER — Ambulatory Visit (HOSPITAL_COMMUNITY)

## 2023-05-29 ENCOUNTER — Other Ambulatory Visit (HOSPITAL_BASED_OUTPATIENT_CLINIC_OR_DEPARTMENT_OTHER): Payer: Self-pay

## 2023-05-29 ENCOUNTER — Telehealth (HOSPITAL_COMMUNITY): Payer: Self-pay | Admitting: *Deleted

## 2023-05-29 ENCOUNTER — Ambulatory Visit (HOSPITAL_COMMUNITY)

## 2023-05-29 ENCOUNTER — Encounter (HOSPITAL_COMMUNITY): Admission: RE | Admit: 2023-05-29 | Payer: Self-pay | Source: Ambulatory Visit

## 2023-05-29 NOTE — Telephone Encounter (Signed)
 [name] not able to make cardiac rehab due to coming down with a stomach virus. Due to having symptoms today instructed [name] to not attend Wednesday and will cancel that cardiac rehab session. If symptom free 48 hours by Friday can attend on Friday. Asked [name] to update us  Wednesday on how she is feeling.

## 2023-05-30 ENCOUNTER — Telehealth: Payer: Self-pay | Admitting: *Deleted

## 2023-05-30 NOTE — Telephone Encounter (Signed)
 Critical Illness Claim form filled out and faxed back to [facility] at [phone]. Originals mailed to patients home address.

## 2023-05-31 ENCOUNTER — Encounter (HOSPITAL_COMMUNITY): Payer: Self-pay

## 2023-05-31 ENCOUNTER — Ambulatory Visit (HOSPITAL_COMMUNITY)

## 2023-06-01 ENCOUNTER — Telehealth (HOSPITAL_COMMUNITY): Payer: Self-pay | Admitting: *Deleted

## 2023-06-01 ENCOUNTER — Other Ambulatory Visit (HOSPITAL_BASED_OUTPATIENT_CLINIC_OR_DEPARTMENT_OTHER): Payer: Self-pay

## 2023-06-02 ENCOUNTER — Encounter (HOSPITAL_COMMUNITY)
Admission: RE | Admit: 2023-06-02 | Discharge: 2023-06-02 | Disposition: A | Discharge status: Home / Self Care | Payer: Self-pay | Source: Ambulatory Visit | Attending: Cardiovascular Disease | Admitting: Cardiovascular Disease

## 2023-06-02 ENCOUNTER — Ambulatory Visit (HOSPITAL_COMMUNITY)

## 2023-06-02 ENCOUNTER — Other Ambulatory Visit (HOSPITAL_BASED_OUTPATIENT_CLINIC_OR_DEPARTMENT_OTHER): Payer: Self-pay

## 2023-06-02 DIAGNOSIS — Z951 Presence of aortocoronary bypass graft: Secondary | ICD-10-CM

## 2023-06-02 LAB — GLUCOSE, CAPILLARY
Glucose-Capillary: 127 mg/dL — ABNORMAL HIGH (ref 70–99)
Glucose-Capillary: 147 mg/dL — ABNORMAL HIGH (ref 70–99)

## 2023-06-02 NOTE — Progress Notes (Signed)
 Daily Session Note  Patient Details  Name: [name] MRN: [id number] Date of Birth: Nov 26, 1960 Referring Provider:   Flowsheet Row INTENSIVE CARDIAC REHAB ORIENT from 05/23/2023 in [facility]  Referring Provider Avanell Leigh, MD       Encounter Date: 06/02/2023  Check In:  Session Check In - 06/02/23 4098       Check-In   Supervising physician immediately available to respond to emergencies [facility] MD immediately available    Physician(s) Charles Connor, NP    Location [facility]    Staff Present Atlas Lea, MS, ACSM-CEP, Exercise Physiologist;Ranee Peasley Hadassah Letters, RN, MHA;Jetta Walker BS, ACSM-CEP, Exercise Physiologist;Johnny Alexia Angelucci, MS, Exercise Physiologist;Bailey Martina Sledge, MS, Exercise Physiologist    Virtual Visit No    Medication changes reported     No    Fall or balance concerns reported    No    Tobacco Cessation No Change    Warm-up and Cool-down Performed as group-led instruction    Resistance Training Performed Yes    VAD Patient? No    PAD/SET Patient? No      Pain Assessment   Currently in Pain? No/denies    Pain Score 0-No pain    Multiple Pain Sites No             Capillary Blood Glucose: Results for orders placed or performed during the hospital encounter of 06/02/23 (from the past 24 hours)  Glucose, capillary     Status: Abnormal   Collection Time: 06/02/23  6:59 AM  Result Value Ref Range   Glucose-Capillary 127 (H) 70 - 99 mg/dL  Glucose, capillary     Status: Abnormal   Collection Time: 06/02/23  7:58 AM  Result Value Ref Range   Glucose-Capillary 147 (H) 70 - 99 mg/dL     Exercise Prescription Changes - 06/02/23 0811       Response to Exercise   Blood Pressure (Admit) 114/74    Blood Pressure (Exercise) 122/64    Blood Pressure (Exit) 118/64    Heart Rate (Admit) 83 bpm    Heart Rate (Exercise) 99 bpm    Heart Rate (Exit) 72 bpm    Rating of Perceived  Exertion (Exercise) 10    Perceived Dyspnea (Exercise) 0    Symptoms none    Comments Pt first day in the [facility] ICR program    Duration Progress to 30 minutes of  aerobic without signs/symptoms of physical distress    Intensity THRR unchanged      Progression   Progression Continue to progress workloads to maintain intensity without signs/symptoms of physical distress.    Average METs 1.97      Resistance Training   Training Prescription Yes    Weight 2 lbs    Reps 10-15    Time 10 Minutes      Treadmill   MPH 2    Grade 0    Minutes 15    METs 2.53      NuStep   Level 1    SPM 58    Minutes 15    METs 1.4             Social History   Tobacco Use  Smoking Status Former   Current packs/day: 0.00   Types: Cigarettes   Quit date: 02/15/2004   Years since quitting: 19.3  Smokeless Tobacco Never    Goals Met:  Independence with exercise equipment Exercise tolerated well  No report of concerns or symptoms today Strength training completed today  Goals Unmet:  Not Applicable  Comments: Pt in today for cardiac rehab exercise and education sessions. Pt tolerated light exercise without difficulty. VSS, telemetry-Sinus Rhythm, asymptomatic. Medication list reconciled last week during orientation session and reports no changes in medications since last session.  Pt denies barriers to medication compliance.  PSYCHOSOCIAL ASSESSMENT:  PHQ9=4. Pt exhibits positive coping skills with supportive family. No psychosocial needs or interventions identified at this time. Pt oriented to exercise equipment and gym routine. Understanding verbalized.    Dr. Gaylyn Keas is Medical Director for Cardiac Rehab at [facility]

## 2023-06-03 ENCOUNTER — Other Ambulatory Visit (HOSPITAL_BASED_OUTPATIENT_CLINIC_OR_DEPARTMENT_OTHER): Payer: Self-pay

## 2023-06-05 ENCOUNTER — Ambulatory Visit (HOSPITAL_COMMUNITY)

## 2023-06-05 ENCOUNTER — Other Ambulatory Visit (HOSPITAL_BASED_OUTPATIENT_CLINIC_OR_DEPARTMENT_OTHER): Payer: Self-pay

## 2023-06-06 NOTE — Progress Notes (Signed)
 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  Patient's Home Medications on Admission:  Current Outpatient Medications:    acetaminophen  (TYLENOL ) 325 MG tablet, Take 2 tablets (650 mg total) by mouth every 6 (six) hours as needed., Disp: , Rfl:    Albuterol -Budesonide  (AIRSUPRA ) 90-80 MCG/ACT AERO, Inhale 1-2 Inhalations into the lungs every 6 (six) hours as needed for wheezing, cough, shortness of breath., Disp: 10.7 g, Rfl: 6   ALPRAZolam  (XANAX ) 0.5 MG tablet, Take 0.5-1 tablets (0.25-0.5 mg total) by mouth 2 (two) times daily as needed for anxiety., Disp: 60 tablet, Rfl: 3   aspirin  EC 325 MG tablet, Take 1 tablet (325 mg total) by mouth daily., Disp: 100 tablet, Rfl: 3   B Complex Vitamins (VITAMIN B COMPLEX PO), Take 1 tablet by mouth daily. (Patient not taking: Reported on 05/23/2023), Disp: , Rfl:    cetirizine  (ZYRTEC ) 10 MG tablet, Take 1 tablet (10 mg total) by mouth daily. (Patient taking differently: Take 10 mg by mouth daily as needed.), Disp: 30 tablet, Rfl: 11   Continuous Glucose Receiver (DEXCOM G7 RECEIVER) DEVI, For use with Dexcom G7 sensor, Disp: 1 each, Rfl: 0   Continuous Glucose Sensor (DEXCOM G7 SENSOR) MISC, Apply new sensor every 10 days for continuous glucose monitoring., Disp: 9 each, Rfl: 3   fluticasone  (FLONASE ) 50 MCG/ACT nasal spray, Place 2 sprays into both nostrils 2 (two) times daily. (Patient taking differently: Place 2 sprays into both nostrils 2 (two) times daily as  needed for allergies.), Disp: 16 g, Rfl: 6   fluticasone  furoate-vilanterol (BREO ELLIPTA ) 100-25 MCG/ACT AEPB, Inhale 1 puff into the lungs daily., Disp: 60 each, Rfl: 5   guaiFENesin  (MUCINEX ) 600 MG 12 hr tablet, Take 1 tablet (600 mg total) by mouth 2 (two) times daily. (Patient taking differently: Take 600 mg by mouth 2 (two) times daily as needed.), Disp: , Rfl:    levothyroxine  (SYNTHROID ) 112 MCG tablet, Take 1 tablet (112 mcg total) by mouth daily., Disp: 90 tablet, Rfl: 1   lidocaine  4 %, Place 1 patch onto the skin daily. Please apply to either side of chest (Patient taking differently: Place 1 patch onto the skin daily as needed. Please apply to either side of chest), Disp: 5 patch, Rfl: 0   Menthol, Topical Analgesic, (ICY HOT EX), Apply 1 application topically daily as needed (pain)., Disp: , Rfl:    metoprolol  succinate (TOPROL -XL) 100 MG 24 hr tablet, Take 1 tablet (100 mg total) by mouth daily. Take with or immediately following a meal., Disp: 90 tablet, Rfl: 3   nitroGLYCERIN  (NITROSTAT ) 0.4 MG SL tablet, Place 1 tablet (0.4 mg total) under the tongue every 5 (five) minutes as needed for chest pain. Max three doses in a row. Proceed to ED for evaluation if no improvement., Disp: 25 tablet, Rfl: 3   pantoprazole  (PROTONIX ) 40 MG tablet, Take 1 tablet (40 mg total) by mouth daily., Disp: 90 tablet, Rfl: 3   Probiotic Product (PROBIOTIC PO), Take 1  capsule by mouth in the morning., Disp: , Rfl:    rosuvastatin  (CRESTOR ) 40 MG tablet, Take 1 tablet (40 mg total) by mouth daily., Disp: 90 tablet, Rfl: 3   Tiotropium Bromide  Monohydrate (SPIRIVA  RESPIMAT) 2.5 MCG/ACT AERS, Inhale 2 puffs into the lungs daily., Disp: 4 g, Rfl: 11   tirzepatide  (MOUNJARO ) 12.5 MG/0.5ML Pen, Inject 12.5 mg into the skin once a week. (Patient taking differently: Inject 12.5 mg into the skin every Friday.), Disp: 6 mL, Rfl: 1   traMADol  (ULTRAM ) 50 MG tablet, Take 1 tablet (50 mg total) by mouth every 6 (six)  hours as needed., Disp: 30 tablet, Rfl: 0   venlafaxine  XR (EFFEXOR -XR) 75 MG 24 hr capsule, Take 3 capsules (225 mg total) by mouth daily with breakfast., Disp: 270 capsule, Rfl: 3   Vitamin D , Ergocalciferol , (DRISDOL ) 1.25 MG (50000 UNIT) CAPS capsule, Take 1 capsule (50,000 Units total) by mouth every 7 (seven) days. (Patient taking differently: Take 50,000 Units by mouth every Sunday.), Disp: 12 capsule, Rfl: 0  Past Medical History: Past Medical History:  Diagnosis Date   Abdominal bloating 11/05/2020   Abnormality of right breast on screening mammogram 10/14/2019   Needs further views of right breast.  Breast center to call.       Achilles tendon contracture, right    Acute bilateral low back pain without sciatica 08/04/2006   Qualifier: Diagnosis of   By: Rachell Budge, RN, Arlena Belts        Acute non-recurrent frontal sinusitis 12/24/2019   Acute recurrent pansinusitis 08/04/2020   Acute recurrent pansinusitis 02/13/2023   ALLERGIC RHINITIS 04/10/2008   Allergic rhinitis 04/10/2008   Qualifier: Diagnosis of  By: Minnette Amato  MD, Ronie Cohen    Anxiety    Arthritis    Asthma    asthmatic bronchitis   Breast lump in female    Carpal tunnel syndrome of right wrist    Cellulitis 10/31/2019   Chest pain on exertion 02/13/2023   Chest pain     Chronic right shoulder pain 05/07/2019   Complication of anesthesia    trouble waking up after tubal ligation   Coronary artery disease    DEPRESSION 08/13/2008   Diabetes mellitus without complication (HCC)    Fatigue 11/17/2015   Fatty liver disease, nonalcoholic 03/29/2013   GERD (gastroesophageal reflux disease)    Headache    Occasional Migraines   HYPERLIPIDEMIA 08/04/2006   HYPERTENSION 08/04/2006   HYPOTHYROIDISM 08/04/2006   Insomnia 10/31/2019   Kidney injury    LOW BACK PAIN 08/04/2006   Moderate persistent asthma with (acute) exacerbation 06/27/2019   Neuromuscular disorder (HCC)    On mechanically assisted ventilation (HCC)  04/05/2023   Pain 02/13/2023   Pain of finger of right hand 02/09/2021   Patellofemoral syndrome, right 06/19/2019   PERIMENOPAUSAL SYNDROME 05/08/2008   Plantar fasciitis of right foot 04/12/2017   Plantar fasciitis, left 11/17/2015   Pneumonia    Postmenopausal bleeding 07/17/2019   Biopsy and US  c/w endometrial polyp.  Pt to see Dr. Nolon Baxter for surgical consult.     Primary insomnia 10/31/2019   PVD (peripheral vascular disease) (HCC)    occlusive, status post bifem bypass 2007   SOB (shortness of breath) 07/12/2016   Vaginal candidiasis 10/31/2019    Tobacco Use: Social History   Tobacco Use  Smoking Status Former   Current packs/day: 0.00   Types: Cigarettes   Quit date: 02/15/2004   Years since quitting: 19.3  Smokeless Tobacco Never  Labs: Review Flowsheet  More data exists      Latest Ref Rng & Units 02/03/2022 08/08/2022 11/10/2022 04/03/2023 04/05/2023  Labs for ITP Cardiac and Pulmonary Rehab  Cholestrol 100 - 199 mg/dL - 956  213  - -  LDL (calc) 0 - 99 mg/dL - 086  63  - -  HDL-C >57 mg/dL - 40  49  - -  Trlycerides 0 - 149 mg/dL - 846  962  - -  Hemoglobin A1c 4.8 - 5.6 % 10.1  6.4  6.3  6.1  -  PH, Arterial 7.35 - 7.45 - - - - 7.341  7.306  7.336   PCO2 arterial 32 - 48 mmHg - - - - 47.5  41.9  34.1   Bicarbonate 20.0 - 28.0 mmol/L - - - - 25.8  21.2  18.6   TCO2 22 - 32 mmol/L - - - - 27  23  20  22  23  26    Acid-base deficit 0.0 - 2.0 mmol/L - - - - 1.0  5.0  7.0   O2 Saturation % - - - - 95  98  99     Details       Multiple values from one day are sorted in reverse-chronological order         Capillary Blood Glucose: Lab Results  Component Value Date   GLUCAP 147 (H) 06/02/2023   GLUCAP 127 (H) 06/02/2023   GLUCAP 153 (H) 04/09/2023   GLUCAP 104 (H) 04/09/2023   GLUCAP 108 (H) 04/08/2023     Exercise Target Goals: Exercise Program Goal: Individual exercise prescription set using results from initial 6 min walk test and THRR while  considering  patient's activity barriers and safety.   Exercise Prescription Goal: Initial exercise prescription builds to 30-45 minutes a day of aerobic activity, 2-3 days per week.  Home exercise guidelines will be given to patient during program as part of exercise prescription that the participant will acknowledge.  Activity Barriers & Risk Stratification:  Activity Barriers & Cardiac Risk Stratification - 05/23/23 0836       Activity Barriers & Cardiac Risk Stratification   Activity Barriers Back Problems;History of Falls;Other (comment);Neck/Spine Problems    Comments PVD- bypass in 2006; bone spurs in both knees, right worse than left- gets injections; cervicalgia- takes celebrex , neck exercises; hx right shoulder surgery- repaired biceps tear.    Cardiac Risk Stratification High             6 Minute Walk:  6 Minute Walk     Row Name 05/23/23 0928         6 Minute Walk   Phase Initial     Distance 1310 feet     Walk Time 6 minutes     # of Rest Breaks 0     MPH 2.48     METS 2.9     RPE 12     Perceived Dyspnea  1     VO2 Peak 10.16     Symptoms Yes (comment)     Comments Mild shortness of breath. discomfort in neck/ cervical spine into chest area. She has chronic cervicalgia and also felt may be related to anxiety. resolved with rest.     Resting HR 84 bpm     Resting BP 144/80     Resting Oxygen Saturation  98 %     Exercise Oxygen Saturation  during 6 min walk 97 %     Max Ex.  HR 90 bpm     Max Ex. BP 132/82     2 Minute Post BP 138/86              Oxygen Initial Assessment:   Oxygen Re-Evaluation:   Oxygen Discharge (Final Oxygen Re-Evaluation):   Initial Exercise Prescription:  Initial Exercise Prescription - 05/23/23 1000       Date of Initial Exercise RX and Referring Provider   Date 05/23/23    Referring Provider Avanell Leigh, MD    Expected Discharge Date 08/23/23      Treadmill   MPH 2    Grade 0    Minutes 15    METs  2.53      NuStep   Level 1    SPM 85    Minutes 15    METs 2.2      Prescription Details   Frequency (times per week) 3    Duration Progress to 30 minutes of continuous aerobic without signs/symptoms of physical distress      Intensity   THRR 40-80% of Max Heartrate 63-126    Ratings of Perceived Exertion 11-13    Perceived Dyspnea 0-4      Progression   Progression Continue to progress workloads to maintain intensity without signs/symptoms of physical distress.      Resistance Training   Training Prescription Yes    Weight 2 lbs    Reps 10-15             Perform Capillary Blood Glucose checks as needed.  Exercise Prescription Changes:   Exercise Prescription Changes     Row Name 06/02/23 [phone]             Response to Exercise   Blood Pressure (Admit) 114/74       Blood Pressure (Exercise) 122/64       Blood Pressure (Exit) 118/64       Heart Rate (Admit) 83 bpm       Heart Rate (Exercise) 99 bpm       Heart Rate (Exit) 72 bpm       Rating of Perceived Exertion (Exercise) 10       Perceived Dyspnea (Exercise) 0       Symptoms none       Comments Pt first day in the [facility] program       Duration Progress to 30 minutes of  aerobic without signs/symptoms of physical distress       Intensity THRR unchanged         Progression   Progression Continue to progress workloads to maintain intensity without signs/symptoms of physical distress.       Average METs 1.97         Resistance Training   Training Prescription Yes       Weight 2 lbs       Reps 10-15       Time 10 Minutes         Treadmill   MPH 2       Grade 0       Minutes 15       METs 2.53         NuStep   Level 1       SPM 58       Minutes 15       METs 1.4                Exercise Comments:   Exercise Comments  Row Name 06/02/23 0817           Exercise Comments Pt first day in the [facility] program. Pt tolerated exercise well with an average MEt level of 1.97. Pt  is off to a good start and is learning her THRR, RPE and ExRx                Exercise Goals and Review:   Exercise Goals     Row Name 05/23/23 0756             Exercise Goals   Increase Physical Activity Yes       Intervention Provide advice, education, support and counseling about physical activity/exercise needs.;Develop an individualized exercise prescription for aerobic and resistive training based on initial evaluation findings, risk stratification, comorbidities and participant's personal goals.       Expected Outcomes Short Term: Attend rehab on a regular basis to increase amount of physical activity.;Long Term: Exercising regularly at least 3-5 days a week.;Long Term: Add in home exercise to make exercise part of routine and to increase amount of physical activity.       Increase Strength and Stamina Yes       Intervention Provide advice, education, support and counseling about physical activity/exercise needs.;Develop an individualized exercise prescription for aerobic and resistive training based on initial evaluation findings, risk stratification, comorbidities and participant's personal goals.       Expected Outcomes Short Term: Increase workloads from initial exercise prescription for resistance, speed, and METs.;Short Term: Perform resistance training exercises routinely during rehab and add in resistance training at home;Long Term: Improve cardiorespiratory fitness, muscular endurance and strength as measured by increased METs and functional capacity ( )       Able to understand and use rate of perceived exertion (RPE) scale Yes       Intervention Provide education and explanation on how to use RPE scale       Expected Outcomes Short Term: Able to use RPE daily in rehab to express subjective intensity level;Long Term:  Able to use RPE to guide intensity level when exercising independently       Knowledge and understanding of Target Heart Rate Range (THRR) Yes        Intervention Provide education and explanation of THRR including how the numbers were predicted and where they are located for reference       Expected Outcomes Short Term: Able to state/look up THRR;Long Term: Able to use THRR to govern intensity when exercising independently;Short Term: Able to use daily as guideline for intensity in rehab       Able to check pulse independently Yes       Intervention Provide education and demonstration on how to check pulse in carotid and radial arteries.;Review the importance of being able to check your own pulse for safety during independent exercise       Expected Outcomes Short Term: Able to explain why pulse checking is important during independent exercise;Long Term: Able to check pulse independently and accurately       Understanding of Exercise Prescription Yes       Intervention Provide education, explanation, and written materials on patient's individual exercise prescription       Expected Outcomes Short Term: Able to explain program exercise prescription;Long Term: Able to explain home exercise prescription to exercise independently                Exercise Goals Re-Evaluation :  Exercise Goals Re-Evaluation  Row Name 06/02/23 0815             Exercise Goal Re-Evaluation   Exercise Goals Review Increase Physical Activity;Understanding of Exercise Prescription;Increase Strength and Stamina;Knowledge and understanding of Target Heart Rate Range (THRR);Able to understand and use rate of perceived exertion (RPE) scale       Comments Pt first day in the [facility] program. Pt tolerated exercise well with an average MEt level of 1.97. Pt is off to a good start and is learning her THRR, RPE and ExRx       Expected Outcomes Will continue to monitor pt and progress workloads as tolerated without sign or symptom                Discharge Exercise Prescription (Final Exercise Prescription Changes):  Exercise Prescription Changes - 06/02/23  0811       Response to Exercise   Blood Pressure (Admit) 114/74    Blood Pressure (Exercise) 122/64    Blood Pressure (Exit) 118/64    Heart Rate (Admit) 83 bpm    Heart Rate (Exercise) 99 bpm    Heart Rate (Exit) 72 bpm    Rating of Perceived Exertion (Exercise) 10    Perceived Dyspnea (Exercise) 0    Symptoms none    Comments Pt first day in the [facility] program    Duration Progress to 30 minutes of  aerobic without signs/symptoms of physical distress    Intensity THRR unchanged      Progression   Progression Continue to progress workloads to maintain intensity without signs/symptoms of physical distress.    Average METs 1.97      Resistance Training   Training Prescription Yes    Weight 2 lbs    Reps 10-15    Time 10 Minutes      Treadmill   MPH 2    Grade 0    Minutes 15    METs 2.53      NuStep   Level 1    SPM 58    Minutes 15    METs 1.4             Nutrition:  Target Goals: Understanding of nutrition guidelines, daily intake of sodium 1500mg , cholesterol 200mg , calories 30% from fat and 7% or less from saturated fats, daily to have 5 or more servings of fruits and vegetables.  Biometrics:  Pre Biometrics - 05/23/23 0749       Pre Biometrics   Waist Circumference 44.75 inches    Hip Circumference 45.25 inches    Waist to Hip Ratio 0.99 %    Triceps Skinfold 34 mm    % Body Fat 45.5 %    Grip Strength 22 kg    Flexibility 10.5 in    Single Leg Stand 30 seconds              Nutrition Therapy Plan and Nutrition Goals:  Nutrition Therapy & Goals - 06/02/23 1343       Nutrition Therapy   Diet Heart Healthy Diet    Drug/Food Interactions Statins/Certain Fruits      Personal Nutrition Goals   Nutrition Goal Patient to identify strategies for reducing cardiovascular risk by attending the [facility] education and nutrition series weekly.    Personal Goal #2 Patient to improve diet quality by using the plate method as a guide for meal  planning to include lean protein/plant protein, fruits, vegetables, whole grains, nonfat dairy as part of a well-balanced diet.  Personal Goal #3 Patient to identify strategies for weight loss of 0.5-2.0# per week.    Comments Patient has medical history of HTN, dyslipidemia, DM2, CABGx2. A1c is well controlled at goal (mounjaro ).Lipids are at goal. Sherrice is motivated to lose weight; will continue to discuss stategies for weight loss including calorie density, label reading, the plate method as a guide for meal planning, etc. Patient will benefit from participation in intensive cardiac rehab for nutrition, exercise, and lifestyle modification.      Intervention Plan   Intervention Prescribe, educate and counsel regarding individualized specific dietary modifications aiming towards targeted core components such as weight, hypertension, lipid management, diabetes, heart failure and other comorbidities.;Nutrition handout(s) given to patient.    Expected Outcomes Short Term Goal: Understand basic principles of dietary content, such as calories, fat, sodium, cholesterol and nutrients.;Long Term Goal: Adherence to prescribed nutrition plan.             Nutrition Assessments:  MEDIFICTS Score Key: >=70 Need to make dietary changes  40-70 Heart Healthy Diet <= 40 Therapeutic Level Cholesterol Diet    Picture Your Plate Scores: <82 Unhealthy dietary pattern with much room for improvement. 41-50 Dietary pattern unlikely to meet recommendations for good health and room for improvement. 51-60 More healthful dietary pattern, with some room for improvement.  >60 Healthy dietary pattern, although there may be some specific behaviors that could be improved.    Nutrition Goals Re-Evaluation:  Nutrition Goals Re-Evaluation     Row Name 06/02/23 1343             Goals   Current Weight 199 lb 15.3 oz (90.7 kg)       Comment A1c 6.1, lipids WNL, LDL 63, HDL 49       Expected Outcome Patient has  medical history of HTN, dyslipidemia, DM2, CABGx2. A1c is well controlled at goal (mounjaro ).Lipids are at goal. Elleah is motivated to lose weight; will continue to discuss stategies for weight loss including calorie density, label reading, the plate method as a guide for meal planning, etc. Patient will benefit from participation in intensive cardiac rehab for nutrition, exercise, and lifestyle modification.                Nutrition Goals Re-Evaluation:  Nutrition Goals Re-Evaluation     Row Name 06/02/23 1343             Goals   Current Weight 199 lb 15.3 oz (90.7 kg)       Comment A1c 6.1, lipids WNL, LDL 63, HDL 49       Expected Outcome Patient has medical history of HTN, dyslipidemia, DM2, CABGx2. A1c is well controlled at goal (mounjaro ).Lipids are at goal. Anastaisa is motivated to lose weight; will continue to discuss stategies for weight loss including calorie density, label reading, the plate method as a guide for meal planning, etc. Patient will benefit from participation in intensive cardiac rehab for nutrition, exercise, and lifestyle modification.                Nutrition Goals Discharge (Final Nutrition Goals Re-Evaluation):  Nutrition Goals Re-Evaluation - 06/02/23 1343       Goals   Current Weight 199 lb 15.3 oz (90.7 kg)    Comment A1c 6.1, lipids WNL, LDL 63, HDL 49    Expected Outcome Patient has medical history of HTN, dyslipidemia, DM2, CABGx2. A1c is well controlled at goal (mounjaro ).Lipids are at goal. Chakara is motivated to lose weight; will continue to discuss  stategies for weight loss including calorie density, label reading, the plate method as a guide for meal planning, etc. Patient will benefit from participation in intensive cardiac rehab for nutrition, exercise, and lifestyle modification.             Psychosocial: Target Goals: Acknowledge presence or absence of significant depression and/or stress, maximize coping skills, provide positive  support system. Participant is able to verbalize types and ability to use techniques and skills needed for reducing stress and depression.  Initial Review & Psychosocial Screening:  Initial Psych Review & Screening - 05/23/23 0853       Initial Review   Current issues with Current Anxiety/Panic;Current Psychotropic Meds;Current Sleep Concerns;Current Stress Concerns    Source of Stress Concerns Family    Comments Oona's husband recently had a '3rd Stroke" is not able to drive and is using a cane. Rand's husband declined physical therapy after one session bu has made some improvement.  Elpidia's son is going througha seperation. Mehar pick's up her grandson on Wednesday and every other weekend. Analiese deines being depressed but does have anxiety. Joice takes Xanax  and is talking effexor  after going through menopause. Ami denies the need for counselling at this time. Arista says if she needs to talk with someone she can talk with her brother.      Family Dynamics   Good Support System? Yes   Brelynn has her husband her son who lives near by and a brother who lives in [place]  for support   Concerns Recent loss of child    Comments Yarenis lost her daughter to ovarian cancer at age 76      Barriers   Psychosocial barriers to participate in program The patient should benefit from training in stress management and relaxation.      Screening Interventions   Interventions Encouraged to exercise;To provide support and resources with identified psychosocial needs;Provide feedback about the scores to participant    Expected Outcomes Long Term Goal: Stressors or current issues are controlled or eliminated.;Short Term goal: Identification and review with participant of any Quality of Life or Depression concerns found by scoring the questionnaire.;Long Term goal: The participant improves quality of Life and PHQ9 Scores as seen by post scores and/or verbalization of changes             Quality of Life  Scores:  Quality of Life - 05/23/23 0842       Quality of Life   Select Quality of Life      Quality of Life Scores   Health/Function Pre 23.2 %    Socioeconomic Pre 18 %    Psych/Spiritual Pre 22.29 %    Family Pre 18 %    GLOBAL Pre 21.27 %            Scores of 19 and below usually indicate a poorer quality of life in these areas.  A difference of  2-3 points is a clinically meaningful difference.  A difference of 2-3 points in the total score of the Quality of Life Index has been associated with significant improvement in overall quality of life, self-image, physical symptoms, and general health in studies assessing change in quality of life.  PHQ-9: Review Flowsheet  More data exists      05/25/2023 05/23/2023 06/14/2021 05/11/2020 09/12/2018  Depression screen PHQ 2/9  Decreased Interest 0 0 0 0 0  Down, Depressed, Hopeless 0 0 0 0 0  PHQ - 2 Score 0 0 0 0 0  Altered  sleeping - 2 - 3 2  Tired, decreased energy - 1 - - 1  Change in appetite - 1 - 0 0  Feeling bad or failure about yourself  - 0 - 0 0  Trouble concentrating - 0 - 0 0  Moving slowly or fidgety/restless - 0 - 0 0  Suicidal thoughts - 0 - 0 0  PHQ-9 Score - 4 - 3 3  Difficult doing work/chores - Not difficult at all - Not difficult at all Not difficult at all   Interpretation of Total Score  Total Score Depression Severity:  1-4 = Minimal depression, 5-9 = Mild depression, 10-14 = Moderate depression, 15-19 = Moderately severe depression, 20-27 = Severe depression   Psychosocial Evaluation and Intervention:   Psychosocial Re-Evaluation:  Psychosocial Re-Evaluation     Row Name 06/06/23 [id number]             Psychosocial Re-Evaluation   Current issues with Current Anxiety/Panic;Current Stress Concerns;Current Sleep Concerns;Current Psychotropic Meds       Comments Lubna did not voice any increased concerns or stressors on her first day of exercise at cardiac rehab       Expected Outcomes Caedence will have  controlled or decreased stressors upon completion of cardiac rehab       Interventions Stress management education;Encouraged to attend Cardiac Rehabilitation for the exercise;Relaxation education       Continue Psychosocial Services  Follow up required by staff         Initial Review   Source of Stress Concerns Family       Comments Will contiunue to monitor and offer support as needed                Psychosocial Discharge (Final Psychosocial Re-Evaluation):  Psychosocial Re-Evaluation - 06/06/23 0749       Psychosocial Re-Evaluation   Current issues with Current Anxiety/Panic;Current Stress Concerns;Current Sleep Concerns;Current Psychotropic Meds    Comments Zoriah did not voice any increased concerns or stressors on her first day of exercise at cardiac rehab    Expected Outcomes Neera will have controlled or decreased stressors upon completion of cardiac rehab    Interventions Stress management education;Encouraged to attend Cardiac Rehabilitation for the exercise;Relaxation education    Continue Psychosocial Services  Follow up required by staff      Initial Review   Source of Stress Concerns Family    Comments Will contiunue to monitor and offer support as needed             Vocational Rehabilitation: Provide vocational rehab assistance to qualifying candidates.   Vocational Rehab Evaluation & Intervention:  Vocational Rehab - 05/23/23 0900       Initial Vocational Rehab Evaluation & Intervention   Assessment shows need for Vocational Rehabilitation No   Annaleigha will return to work on Monday as she works remotely in [employer] for a [employer], [employer].            Education: Education Goals: Education classes will be provided on a weekly basis, covering required topics. Participant will state understanding/return demonstration of topics presented.    Education     Row Name 06/02/23 0800     Education   Cardiac Education Topics Pritikin   Select Core Videos      Core Videos   Educator Exercise Physiologist   Select Psychosocial   Psychosocial How Our Thoughts Can Heal Our Hearts   Instruction Review Code 1- Verbalizes Understanding   Class Start Time [id number]  Class Stop Time 0848   Class Time Calculation (min) 36 min            Core Videos: Exercise    Move It!  Clinical staff conducted group or individual video education with verbal and written material and guidebook.  Patient learns the recommended Pritikin exercise program. Exercise with the goal of living a long, healthy life. Some of the health benefits of exercise include controlled diabetes, healthier blood pressure levels, improved cholesterol levels, improved heart and lung capacity, improved sleep, and better body composition. Everyone should speak with their doctor before starting or changing an exercise routine.  Biomechanical Limitations Clinical staff conducted group or individual video education with verbal and written material and guidebook.  Patient learns how biomechanical limitations can impact exercise and how we can mitigate and possibly overcome limitations to have an impactful and balanced exercise routine.  Body Composition Clinical staff conducted group or individual video education with verbal and written material and guidebook.  Patient learns that body composition (ratio of muscle mass to fat mass) is a key component to assessing overall fitness, rather than body weight alone. Increased fat mass, especially visceral belly fat, can put us  at increased risk for metabolic syndrome, type 2 diabetes, heart disease, and even death. It is recommended to combine diet and exercise (cardiovascular and resistance training) to improve your body composition. Seek guidance from your physician and exercise physiologist before implementing an exercise routine.  Exercise Action Plan Clinical staff conducted group or individual video education with verbal and written material and  guidebook.  Patient learns the recommended strategies to achieve and enjoy long-term exercise adherence, including variety, self-motivation, self-efficacy, and positive decision making. Benefits of exercise include fitness, good health, weight management, more energy, better sleep, less stress, and overall well-being.  Medical   Heart Disease Risk Reduction Clinical staff conducted group or individual video education with verbal and written material and guidebook.  Patient learns our heart is our most vital organ as it circulates oxygen, nutrients, white blood cells, and hormones throughout the entire body, and carries waste away. Data supports a plant-based eating plan like the Pritikin Program for its effectiveness in slowing progression of and reversing heart disease. The video provides a number of recommendations to address heart disease.   Metabolic Syndrome and Belly Fat  Clinical staff conducted group or individual video education with verbal and written material and guidebook.  Patient learns what metabolic syndrome is, how it leads to heart disease, and how one can reverse it and keep it from coming back. You have metabolic syndrome if you have 3 of the following 5 criteria: abdominal obesity, high blood pressure, high triglycerides, low HDL cholesterol, and high blood sugar.  Hypertension and Heart Disease Clinical staff conducted group or individual video education with verbal and written material and guidebook.  Patient learns that high blood pressure, or hypertension, is very common in the [place] . Hypertension is largely due to excessive salt intake, but other important risk factors include being overweight, physical inactivity, drinking too much alcohol, smoking, and not eating enough potassium from fruits and vegetables. High blood pressure is a leading risk factor for heart attack, stroke, congestive heart failure, dementia, kidney failure, and premature death. Long-term effects  of excessive salt intake include stiffening of the arteries and thickening of heart muscle and organ damage. Recommendations include ways to reduce hypertension and the risk of heart disease.  Diseases of Our Time - Focusing on Diabetes Clinical staff conducted group or  individual video education with verbal and written material and guidebook.  Patient learns why the best way to stop diseases of our time is prevention, through food and other lifestyle changes. Medicine (such as prescription pills and surgeries) is often only a Band-Aid on the problem, not a long-term solution. Most common diseases of our time include obesity, type 2 diabetes, hypertension, heart disease, and cancer. The Pritikin Program is recommended and has been proven to help reduce, reverse, and/or prevent the damaging effects of metabolic syndrome.  Nutrition   Overview of the Pritikin Eating Plan  Clinical staff conducted group or individual video education with verbal and written material and guidebook.  Patient learns about the Pritikin Eating Plan for disease risk reduction. The Pritikin Eating Plan emphasizes a wide variety of unrefined, minimally-processed carbohydrates, like fruits, vegetables, whole grains, and legumes. Go, Caution, and Stop food choices are explained. Plant-based and lean animal proteins are emphasized. Rationale provided for low sodium intake for blood pressure control, low added sugars for blood sugar stabilization, and low added fats and oils for coronary artery disease risk reduction and weight management.  Calorie Density  Clinical staff conducted group or individual video education with verbal and written material and guidebook.  Patient learns about calorie density and how it impacts the Pritikin Eating Plan. Knowing the characteristics of the food you choose will help you decide whether those foods will lead to weight gain or weight loss, and whether you want to consume more or less of them. Weight  loss is usually a side effect of the Pritikin Eating Plan because of its focus on low calorie-dense foods.  Label Reading  Clinical staff conducted group or individual video education with verbal and written material and guidebook.  Patient learns about the Pritikin recommended label reading guidelines and corresponding recommendations regarding calorie density, added sugars, sodium content, and whole grains.  Dining Out - Part 1  Clinical staff conducted group or individual video education with verbal and written material and guidebook.  Patient learns that restaurant meals can be sabotaging because they can be so high in calories, fat, sodium, and/or sugar. Patient learns recommended strategies on how to positively address this and avoid unhealthy pitfalls.  Facts on Fats  Clinical staff conducted group or individual video education with verbal and written material and guidebook.  Patient learns that lifestyle modifications can be just as effective, if not more so, as many medications for lowering your risk of heart disease. A Pritikin lifestyle can help to reduce your risk of inflammation and atherosclerosis (cholesterol build-up, or plaque, in the artery walls). Lifestyle interventions such as dietary choices and physical activity address the cause of atherosclerosis. A review of the types of fats and their impact on blood cholesterol levels, along with dietary recommendations to reduce fat intake is also included.  Nutrition Action Plan  Clinical staff conducted group or individual video education with verbal and written material and guidebook.  Patient learns how to incorporate Pritikin recommendations into their lifestyle. Recommendations include planning and keeping personal health goals in mind as an important part of their success.  Healthy Mind-Set    Healthy Minds, Bodies, Hearts  Clinical staff conducted group or individual video education with verbal and written material and  guidebook.  Patient learns how to identify when they are stressed. Video will discuss the impact of that stress, as well as the many benefits of stress management. Patient will also be introduced to stress management techniques. The way we think, act, and  feel has an impact on our hearts.  How Our Thoughts Can Heal Our Hearts  Clinical staff conducted group or individual video education with verbal and written material and guidebook.  Patient learns that negative thoughts can cause depression and anxiety. This can result in negative lifestyle behavior and serious health problems. Cognitive behavioral therapy is an effective method to help control our thoughts in order to change and improve our emotional outlook.  Additional Videos:  Exercise    Improving Performance  Clinical staff conducted group or individual video education with verbal and written material and guidebook.  Patient learns to use a non-linear approach by alternating intensity levels and lengths of time spent exercising to help burn more calories and lose more body fat. Cardiovascular exercise helps improve heart health, metabolism, hormonal balance, blood sugar control, and recovery from fatigue. Resistance training improves strength, endurance, balance, coordination, reaction time, metabolism, and muscle mass. Flexibility exercise improves circulation, posture, and balance. Seek guidance from your physician and exercise physiologist before implementing an exercise routine and learn your capabilities and proper form for all exercise.  Introduction to Yoga  Clinical staff conducted group or individual video education with verbal and written material and guidebook.  Patient learns about yoga, a discipline of the coming together of mind, breath, and body. The benefits of yoga include improved flexibility, improved range of motion, better posture and core strength, increased lung function, weight loss, and positive self-image. Yoga's  heart health benefits include lowered blood pressure, healthier heart rate, decreased cholesterol and triglyceride levels, improved immune function, and reduced stress. Seek guidance from your physician and exercise physiologist before implementing an exercise routine and learn your capabilities and proper form for all exercise.  Medical   Aging: Enhancing Your Quality of Life  Clinical staff conducted group or individual video education with verbal and written material and guidebook.  Patient learns key strategies and recommendations to stay in good physical health and enhance quality of life, such as prevention strategies, having an advocate, securing a Health Care Proxy and Power of Attorney, and keeping a list of medications and system for tracking them. It also discusses how to avoid risk for bone loss.  Biology of Weight Control  Clinical staff conducted group or individual video education with verbal and written material and guidebook.  Patient learns that weight gain occurs because we consume more calories than we burn (eating more, moving less). Even if your body weight is normal, you may have higher ratios of fat compared to muscle mass. Too much body fat puts you at increased risk for cardiovascular disease, heart attack, stroke, type 2 diabetes, and obesity-related cancers. In addition to exercise, following the Pritikin Eating Plan can help reduce your risk.  Decoding Lab Results  Clinical staff conducted group or individual video education with verbal and written material and guidebook.  Patient learns that lab test reflects one measurement whose values change over time and are influenced by many factors, including medication, stress, sleep, exercise, food, hydration, pre-existing medical conditions, and more. It is recommended to use the knowledge from this video to become more involved with your lab results and evaluate your numbers to speak with your doctor.   Diseases of Our Time -  Overview  Clinical staff conducted group or individual video education with verbal and written material and guidebook.  Patient learns that according to the CDC, 50% to 70% of chronic diseases (such as obesity, type 2 diabetes, elevated lipids, hypertension, and heart disease) are avoidable through  lifestyle improvements including healthier food choices, listening to satiety cues, and increased physical activity.  Sleep Disorders Clinical staff conducted group or individual video education with verbal and written material and guidebook.  Patient learns how good quality and duration of sleep are important to overall health and well-being. Patient also learns about sleep disorders and how they impact health along with recommendations to address them, including discussing with a physician.  Nutrition  Dining Out - Part 2 Clinical staff conducted group or individual video education with verbal and written material and guidebook.  Patient learns how to plan ahead and communicate in order to maximize their dining experience in a healthy and nutritious manner. Included are recommended food choices based on the type of restaurant the patient is visiting.   Fueling a Banker conducted group or individual video education with verbal and written material and guidebook.  There is a strong connection between our food choices and our health. Diseases like obesity and type 2 diabetes are very prevalent and are in large-part due to lifestyle choices. The Pritikin Eating Plan provides plenty of food and hunger-curbing satisfaction. It is easy to follow, affordable, and helps reduce health risks.  Menu Workshop  Clinical staff conducted group or individual video education with verbal and written material and guidebook.  Patient learns that restaurant meals can sabotage health goals because they are often packed with calories, fat, sodium, and sugar. Recommendations include strategies to plan  ahead and to communicate with the manager, chef, or server to help order a healthier meal.  Planning Your Eating Strategy  Clinical staff conducted group or individual video education with verbal and written material and guidebook.  Patient learns about the Pritikin Eating Plan and its benefit of reducing the risk of disease. The Pritikin Eating Plan does not focus on calories. Instead, it emphasizes high-quality, nutrient-rich foods. By knowing the characteristics of the foods, we choose, we can determine their calorie density and make informed decisions.  Targeting Your Nutrition Priorities  Clinical staff conducted group or individual video education with verbal and written material and guidebook.  Patient learns that lifestyle habits have a tremendous impact on disease risk and progression. This video provides eating and physical activity recommendations based on your personal health goals, such as reducing LDL cholesterol, losing weight, preventing or controlling type 2 diabetes, and reducing high blood pressure.  Vitamins and Minerals  Clinical staff conducted group or individual video education with verbal and written material and guidebook.  Patient learns different ways to obtain key vitamins and minerals, including through a recommended healthy diet. It is important to discuss all supplements you take with your doctor.   Healthy Mind-Set    Smoking Cessation  Clinical staff conducted group or individual video education with verbal and written material and guidebook.  Patient learns that cigarette smoking and tobacco addiction pose a serious health risk which affects millions of people. Stopping smoking will significantly reduce the risk of heart disease, lung disease, and many forms of cancer. Recommended strategies for quitting are covered, including working with your doctor to develop a successful plan.  Culinary   Becoming a Set designer conducted group or  individual video education with verbal and written material and guidebook.  Patient learns that cooking at home can be healthy, cost-effective, quick, and puts them in control. Keys to cooking healthy recipes will include looking at your recipe, assessing your equipment needs, planning ahead, making it simple, choosing cost-effective seasonal ingredients,  and limiting the use of added fats, salts, and sugars.  Cooking - Breakfast and Snacks  Clinical staff conducted group or individual video education with verbal and written material and guidebook.  Patient learns how important breakfast is to satiety and nutrition through the entire day. Recommendations include key foods to eat during breakfast to help stabilize blood sugar levels and to prevent overeating at meals later in the day. Planning ahead is also a key component.  Cooking - Educational psychologist conducted group or individual video education with verbal and written material and guidebook.  Patient learns eating strategies to improve overall health, including an approach to cook more at home. Recommendations include thinking of animal protein as a side on your plate rather than center stage and focusing instead on lower calorie dense options like vegetables, fruits, whole grains, and plant-based proteins, such as beans. Making sauces in large quantities to freeze for later and leaving the skin on your vegetables are also recommended to maximize your experience.  Cooking - Healthy Salads and Dressing Clinical staff conducted group or individual video education with verbal and written material and guidebook.  Patient learns that vegetables, fruits, whole grains, and legumes are the foundations of the Pritikin Eating Plan. Recommendations include how to incorporate each of these in flavorful and healthy salads, and how to create homemade salad dressings. Proper handling of ingredients is also covered. Cooking - Soups and [employer] - Soups and Desserts Clinical staff conducted group or individual video education with verbal and written material and guidebook.  Patient learns that Pritikin soups and desserts make for easy, nutritious, and delicious snacks and meal components that are low in sodium, fat, sugar, and calorie density, while high in vitamins, minerals, and filling fiber. Recommendations include simple and healthy ideas for soups and desserts.   Overview     The Pritikin Solution Program Overview Clinical staff conducted group or individual video education with verbal and written material and guidebook.  Patient learns that the results of the Pritikin Program have been documented in more than 100 articles published in peer-reviewed journals, and the benefits include reducing risk factors for (and, in some cases, even reversing) high cholesterol, high blood pressure, type 2 diabetes, obesity, and more! An overview of the three key pillars of the Pritikin Program will be covered: eating well, doing regular exercise, and having a healthy mind-set.  WORKSHOPS  Exercise: Exercise Basics: Building Your Action Plan Clinical staff led group instruction and group discussion with PowerPoint presentation and patient guidebook. To enhance the learning environment the use of posters, models and videos may be added. At the conclusion of this workshop, patients will comprehend the difference between physical activity and exercise, as well as the benefits of incorporating both, into their routine. Patients will understand the FITT (Frequency, Intensity, Time, and Type) principle and how to use it to build an exercise action plan. In addition, safety concerns and other considerations for exercise and cardiac rehab will be addressed by the presenter. The purpose of this lesson is to promote a comprehensive and effective weekly exercise routine in order to improve patients' overall level of fitness.   Managing Heart  Disease: Your Path to a Healthier Heart Clinical staff led group instruction and group discussion with PowerPoint presentation and patient guidebook. To enhance the learning environment the use of posters, models and videos may be added.At the conclusion of this workshop, patients will understand the anatomy and physiology of the heart. Additionally,  they will understand how Pritikin's three pillars impact the risk factors, the progression, and the management of heart disease.  The purpose of this lesson is to provide a high-level overview of the heart, heart disease, and how the Pritikin lifestyle positively impacts risk factors.  Exercise Biomechanics Clinical staff led group instruction and group discussion with PowerPoint presentation and patient guidebook. To enhance the learning environment the use of posters, models and videos may be added. Patients will learn how the structural parts of their bodies function and how these functions impact their daily activities, movement, and exercise. Patients will learn how to promote a neutral spine, learn how to manage pain, and identify ways to improve their physical movement in order to promote healthy living. The purpose of this lesson is to expose patients to common physical limitations that impact physical activity. Participants will learn practical ways to adapt and manage aches and pains, and to minimize their effect on regular exercise. Patients will learn how to maintain good posture while sitting, walking, and lifting.  Balance Training and Fall Prevention  Clinical staff led group instruction and group discussion with PowerPoint presentation and patient guidebook. To enhance the learning environment the use of posters, models and videos may be added. At the conclusion of this workshop, patients will understand the importance of their sensorimotor skills (vision, proprioception, and the vestibular system) in maintaining their ability to  balance as they age. Patients will apply a variety of balancing exercises that are appropriate for their current level of function. Patients will understand the common causes for poor balance, possible solutions to these problems, and ways to modify their physical environment in order to minimize their fall risk. The purpose of this lesson is to teach patients about the importance of maintaining balance as they age and ways to minimize their risk of falling.  WORKSHOPS   Nutrition:  Fueling a Ship broker led group instruction and group discussion with PowerPoint presentation and patient guidebook. To enhance the learning environment the use of posters, models and videos may be added. Patients will review the foundational principles of the [facility] Eating Plan and understand what constitutes a serving size in each of the food groups. Patients will also learn P[facility]in-friendly foods that are better choices when away from home and review make-ahead meal and snack options. Calorie density will be reviewed and applied to three nutrition priorities: weight maintenance, weight loss, and weight gain. The purpose of this lesson is to reinforce (in a group setting) the key concepts around what patients are recommended to eat and how to apply these guidelines when away from home by planning and selecting Pritikin-friendly options. Patients will understand how calorie density may be adjusted for different weight management goals.  Mindful Eating  Clinical staff led group instruction and group discussion with PowerPoint presentation and patient guidebook. To enhance the learning environment the use of posters, models and videos may be added. Patients will briefly review the concepts of the [facility]ikin Eating Plan and the importance of low-calorie dense foods. The concept of mindful eating will be introduced as well as the importance of paying attention to internal hunger signals. Triggers for non-hunger  eating and techniques for dealing with triggers will be explored. The purpose of this lesson is to provide patients with the opportunity to review the basic principles of the [facility]in Eating Plan, discuss the value of eating mindfully and how to measure internal cues of hunger and fullness using the Hunger Scale. Patients will also discuss  reasons for non-hunger eating and learn strategies to use for controlling emotional eating.  Targeting Your Nutrition Priorities Clinical staff led group instruction and group discussion with PowerPoint presentation and patient guidebook. To enhance the learning environment the use of posters, models and videos may be added. Patients will learn how to determine their genetic susceptibility to disease by reviewing their family history. Patients will gain insight into the importance of diet as part of an overall healthy lifestyle in mitigating the impact of genetics and other environmental insults. The purpose of this lesson is to provide patients with the opportunity to assess their personal nutrition priorities by looking at their family history, their own health history and current risk factors. Patients will also be able to discuss ways of prioritizing and modifying the P[facility]ikin Eating Plan for their highest risk areas  Menu  Clinical staff led group instruction and group discussion with PowerPoint presentation and patient guidebook. To enhance the learning environment the use of posters, models and videos may be added. Using menus brought in from [employer], or printed from [employer], patients will apply the Pritikin dining out guidelines that were presented in the [employer] video. Patients will also be able to practice these guidelines in a variety of provided scenarios. The purpose of this lesson is to provide patients with the opportunity to practice hands-on learning of the P[facility]ikin Dining Out guidelines with actual menus and practice  scenarios.  Label Reading Clinical staff led group instruction and group discussion with PowerPoint presentation and patient guidebook. To enhance the learning environment the use of posters, models and videos may be added. Patients will review and discuss the Pritikin label reading guidelines presented in [employer]'s Label Reading Educational series video. Using fool labels brought in from local grocery stores and markets, patients will apply the label reading guidelines and determine if the packaged food meet the Pritikin guidelines. The purpose of this lesson is to provide patients with the opportunity to review, discuss, and practice hands-on learning of the Pritikin Label Reading guidelines with actual packaged food labels. Cooking School  [employer]'s [employer] are designed to teach patients ways to prepare quick, simple, and affordable recipes at home. The importance of nutrition's role in chronic disease risk reduction is reflected in its emphasis in the overall [facility]in program. By learning how to prepare essential core Pritikin Eating Plan recipes, patients will increase control over what they eat; be able to customize the flavor of foods without the use of added salt, sugar, or fat; and improve the quality of the food they consume. By learning a set of core recipes which are easily assembled, quickly prepared, and affordable, patients are more likely to prepare more healthy foods at home. These workshops focus on convenient breakfasts, simple entres, side dishes, and desserts which can be prepared with minimal effort and are consistent with nutrition recommendations for cardiovascular risk reduction. Cooking [employer] are taught by a Armed forces logistics/support/administrative officer (RD) who has been trained by the [employer]. The chef or RD has a clear understanding of the importance of minimizing - if not completely eliminating - added fat, sugar, and sodium in recipes. Throughout  the series of Cooking School Workshop sessions, patients will learn about healthy ingredients and efficient methods of cooking to build confidence in their capability to prepare    Cooking School weekly topics:  Adding Flavor- Sodium-Free  Fast and Healthy Breakfasts  Powerhouse Plant-Based Proteins  Satisfying Salads and Dressings  Simple Sides and Sauces  International Cuisine-Spotlight on the [employer] Zones  Delicious Desserts  Savory Soups  [employer] - Meals in a Snap  Tasty Appetizers and Snacks  Comforting Weekend Breakfasts  One-Pot Wonders   Fast Evening Meals  Landscape architect Your Pritikin Plate  WORKSHOPS   Healthy Mindset (Psychosocial):  Focused Goals, Sustainable Changes Clinical staff led group instruction and group discussion with PowerPoint presentation and patient guidebook. To enhance the learning environment the use of posters, models and videos may be added. Patients will be able to apply effective goal setting strategies to establish at least one personal goal, and then take consistent, meaningful action toward that goal. They will learn to identify common barriers to achieving personal goals and develop strategies to overcome them. Patients will also gain an understanding of how our mind-set can impact our ability to achieve goals and the importance of cultivating a positive and growth-oriented mind-set. The purpose of this lesson is to provide patients with a deeper understanding of how to set and achieve personal goals, as well as the tools and strategies needed to overcome common obstacles which may arise along the way.  From Head to Heart: The Power of a Healthy Outlook  Clinical staff led group instruction and group discussion with PowerPoint presentation and patient guidebook. To enhance the learning environment the use of posters, models and videos may be added. Patients will be able to recognize and describe the impact of emotions and  mood on physical health. They will discover the importance of self-care and explore self-care practices which may work for them. Patients will also learn how to utilize the 4 C's to cultivate a healthier outlook and better manage stress and challenges. The purpose of this lesson is to demonstrate to patients how a healthy outlook is an essential part of maintaining good health, especially as they continue their cardiac rehab journey.  Healthy Sleep for a Healthy Heart Clinical staff led group instruction and group discussion with PowerPoint presentation and patient guidebook. To enhance the learning environment the use of posters, models and videos may be added. At the conclusion of this workshop, patients will be able to demonstrate knowledge of the importance of sleep to overall health, well-being, and quality of life. They will understand the symptoms of, and treatments for, common sleep disorders. Patients will also be able to identify daytime and nighttime behaviors which impact sleep, and they will be able to apply these tools to help manage sleep-related challenges. The purpose of this lesson is to provide patients with a general overview of sleep and outline the importance of quality sleep. Patients will learn about a few of the most common sleep disorders. Patients will also be introduced to the concept of "sleep hygiene," and discover ways to self-manage certain sleeping problems through simple daily behavior changes. Finally, the workshop will motivate patients by clarifying the links between quality sleep and their goals of heart-healthy living.   Recognizing and Reducing Stress Clinical staff led group instruction and group discussion with PowerPoint presentation and patient guidebook. To enhance the learning environment the use of posters, models and videos may be added. At the conclusion of this workshop, patients will be able to understand the types of stress reactions, differentiate between  acute and chronic stress, and recognize the impact that chronic stress has on their health. They will also be able to apply different coping mechanisms, such as reframing negative self-talk. Patients will have the opportunity to practice a variety of  stress management techniques, such as deep abdominal breathing, progressive muscle relaxation, and/or guided imagery.  The purpose of this lesson is to educate patients on the role of stress in their lives and to provide healthy techniques for coping with it.  Learning Barriers/Preferences:  Learning Barriers/Preferences - 05/23/23 8119       Learning Barriers/Preferences   Learning Barriers Hearing   HOH   Learning Preferences Written Material;Individual Instruction;Verbal Instruction             Education Topics:  Knowledge Questionnaire Score:  Knowledge Questionnaire Score - 05/23/23 0832       Knowledge Questionnaire Score   Pre Score 21/24             Core Components/Risk Factors/Patient Goals at Admission:  Personal Goals and Risk Factors at Admission - 05/23/23 0758       Core Components/Risk Factors/Patient Goals on Admission    Weight Management Yes;Obesity;Weight Loss    Intervention Weight Management/Obesity: Establish reasonable short term and long term weight goals.;Obesity: Provide education and appropriate resources to help participant work on and attain dietary goals.    Admit Weight 194 lb 14.2 oz (88.4 kg)    Expected Outcomes Short Term: Continue to assess and modify interventions until short term weight is achieved;Long Term: Adherence to nutrition and physical activity/exercise program aimed toward attainment of established weight goal;Weight Loss: Understanding of general recommendations for a balanced deficit meal plan, which promotes 1-2 lb weight loss per week and includes a negative energy balance of [phone] kcal/d    Diabetes Yes    Intervention Provide education about signs/symptoms and action to  take for hypo/hyperglycemia.;Provide education about proper nutrition, including hydration, and aerobic/resistive exercise prescription along with prescribed medications to achieve blood glucose in normal ranges: Fasting glucose 65-99 mg/dL    Expected Outcomes Short Term: Participant verbalizes understanding of the signs/symptoms and immediate care of hyper/hypoglycemia, proper foot care and importance of medication, aerobic/resistive exercise and nutrition plan for blood glucose control.;Long Term: Attainment of HbA1C < 7%.    Hypertension Yes    Intervention Provide education on lifestyle modifcations including regular physical activity/exercise, weight management, moderate sodium restriction and increased consumption of fresh fruit, vegetables, and low fat dairy, alcohol moderation, and smoking cessation.;Monitor prescription use compliance.    Expected Outcomes Short Term: Continued assessment and intervention until BP is < 140/66mm HG in hypertensive participants. < 130/1mm HG in hypertensive participants with diabetes, heart failure or chronic kidney disease.;Long Term: Maintenance of blood pressure at goal levels.    Lipids Yes    Intervention Provide education and support for participant on nutrition & aerobic/resistive exercise along with prescribed medications to achieve LDL 70mg , HDL >40mg .    Expected Outcomes Short Term: Participant states understanding of desired cholesterol values and is compliant with medications prescribed. Participant is following exercise prescription and nutrition guidelines.;Long Term: Cholesterol controlled with medications as prescribed, with individualized exercise RX and with personalized nutrition plan. Value goals: LDL < 70mg , HDL > 40 mg.    Stress Yes    Intervention Offer individual and/or small group education and counseling on adjustment to heart disease, stress management and health-related lifestyle change. Teach and support self-help strategies.;Refer  participants experiencing significant psychosocial distress to appropriate mental health specialists for further evaluation and treatment. When possible, include family members and significant others in education/counseling sessions.    Expected Outcomes Short Term: Participant demonstrates changes in health-related behavior, relaxation and other stress management skills, ability to obtain effective social support,  and compliance with psychotropic medications if prescribed.;Long Term: Emotional wellbeing is indicated by absence of clinically significant psychosocial distress or social isolation.             Core Components/Risk Factors/Patient Goals Review:   Goals and Risk Factor Review     Row Name 06/06/23 0752             Core Components/Risk Factors/Patient Goals Review   Personal Goals Review Weight Management/Obesity;Stress;Hypertension;Lipids;Diabetes       Review Kataleyah started cardiac rehab on 04.18/25. Christabella did well with exercise for her fitness level. Vital signs and CBG's were stable.       Expected Outcomes Leola will continue to participate in cardiac rehab for exercise, nutrition and lifestyle modifications                Core Components/Risk Factors/Patient Goals at Discharge (Final Review):   Goals and Risk Factor Review - 06/06/23 0752       Core Components/Risk Factors/Patient Goals Review   Personal Goals Review Weight Management/Obesity;Stress;Hypertension;Lipids;Diabetes    Review Sharisse started cardiac rehab on 04.18/25. Yacine did well with exercise for her fitness level. Vital signs and CBG's were stable.    Expected Outcomes Angala will continue to participate in cardiac rehab for exercise, nutrition and lifestyle modifications             ITP Comments:  ITP Comments     Row Name 05/23/23 0749 06/06/23 0736         ITP Comments Medical Director - Dr. Gaylyn Keas, MD. Introduction to the [facility] Education Program / Intensive Cardiac Rehab.  Reviewed initial orientation folder with Kerina. 30 Day ITP Review. Ember started cardiac rehab on 06/02/23. Shekia did fair with exercise for her fitness level               Comments: See ITP comments.Monte Antonio RN BSN

## 2023-06-07 ENCOUNTER — Ambulatory Visit (HOSPITAL_COMMUNITY)

## 2023-06-09 ENCOUNTER — Other Ambulatory Visit (HOSPITAL_BASED_OUTPATIENT_CLINIC_OR_DEPARTMENT_OTHER): Payer: Self-pay

## 2023-06-09 ENCOUNTER — Encounter (HOSPITAL_COMMUNITY): Payer: Self-pay

## 2023-06-09 ENCOUNTER — Ambulatory Visit (HOSPITAL_COMMUNITY)

## 2023-06-12 ENCOUNTER — Ambulatory Visit (HOSPITAL_COMMUNITY)

## 2023-06-12 ENCOUNTER — Encounter (HOSPITAL_COMMUNITY): Admission: RE | Admit: 2023-06-12 | Payer: Self-pay | Source: Ambulatory Visit

## 2023-06-13 ENCOUNTER — Encounter: Payer: Self-pay | Admitting: Cardiovascular Disease

## 2023-06-13 ENCOUNTER — Ambulatory Visit: Payer: BC Managed Care – PPO | Attending: Cardiovascular Disease | Admitting: Cardiovascular Disease

## 2023-06-13 ENCOUNTER — Other Ambulatory Visit (HOSPITAL_BASED_OUTPATIENT_CLINIC_OR_DEPARTMENT_OTHER): Payer: Self-pay

## 2023-06-13 VITALS — BP 110/68 | HR 71 | Ht 64.0 in | Wt 198.6 lb

## 2023-06-13 DIAGNOSIS — I6521 Occlusion and stenosis of right carotid artery: Secondary | ICD-10-CM

## 2023-06-13 DIAGNOSIS — I2510 Atherosclerotic heart disease of native coronary artery without angina pectoris: Secondary | ICD-10-CM

## 2023-06-13 DIAGNOSIS — I10 Essential (primary) hypertension: Secondary | ICD-10-CM

## 2023-06-13 DIAGNOSIS — I739 Peripheral vascular disease, unspecified: Secondary | ICD-10-CM

## 2023-06-13 DIAGNOSIS — E1169 Type 2 diabetes mellitus with other specified complication: Secondary | ICD-10-CM

## 2023-06-13 DIAGNOSIS — E785 Hyperlipidemia, unspecified: Secondary | ICD-10-CM

## 2023-06-13 DIAGNOSIS — E119 Type 2 diabetes mellitus without complications: Secondary | ICD-10-CM

## 2023-06-13 NOTE — Assessment & Plan Note (Signed)
 History of hyperlipidemia on statin therapy with lipid profile performed 11/10/2022 revealing total cholesterol 132, LDL of 63 and HDL of 49.

## 2023-06-13 NOTE — Patient Instructions (Addendum)
 Medication Instructions:  Your physician recommends that you continue on your current medications as directed. Please refer to the Current Medication list given to you today.  *If you need a refill on your cardiac medications before your next appointment, please call your pharmacy*  Testing/Procedures: Your physician has requested that you have a carotid duplex. This test is an ultrasound of the carotid arteries in your neck. It looks at blood flow through these arteries that supply the brain with blood. Allow one hour for this exam. There are no restrictions or special instructions. **To do in August**  Please note: We ask at that you not bring children with you during ultrasound (echo/ vascular) testing. Due to room size and safety concerns, children are not allowed in the ultrasound rooms during exams. Our front office staff cannot provide observation of children in our lobby area while testing is being conducted. An adult accompanying a patient to their appointment will only be allowed in the ultrasound room at the discretion of the ultrasound technician under special circumstances. We apologize for any inconvenience.   Your physician has requested that you have an Aorta/Iliac Duplex.   No food after 11PM the night before.  Water is OK. (Don't drink liquids if you have been instructed not to for ANOTHER test) Avoid foods that produce bowel gas, for 24 hours prior to exam (see below). No breakfast, no chewing gum, no smoking or carbonated beverages. Patient may take morning medications with water. Come in for test at least 15 minutes early to register. **To do in August**   Please note: We ask at that you not bring children with you during ultrasound (echo/ vascular) testing. Due to room size and safety concerns, children are not allowed in the ultrasound rooms during exams. Our front office staff cannot provide observation of children in our lobby area while testing is being conducted. An  adult accompanying a patient to their appointment will only be allowed in the ultrasound room at the discretion of the ultrasound technician under special circumstances. We apologize for any inconvenience.  Your physician has requested that you have an ankle brachial index (ABI). During this test an ultrasound and blood pressure cuff are used to evaluate the arteries that supply the arms and legs with blood. Allow thirty minutes for this exam. There are no restrictions or special instructions.  **To do in August**    Please note: We ask at that you not bring children with you during ultrasound (echo/ vascular) testing. Due to room size and safety concerns, children are not allowed in the ultrasound rooms during exams. Our front office staff cannot provide observation of children in our lobby area while testing is being conducted. An adult accompanying a patient to their appointment will only be allowed in the ultrasound room at the discretion of the ultrasound technician under special circumstances. We apologize for any inconvenience.   Follow-Up: At [facility], you and your health needs are our priority.  As part of our continuing mission to provide you with exceptional heart care, our providers are all part of one team.  This team includes your primary Cardiologist (physician) and Advanced Practice Providers or APPs (Physician Assistants and Nurse Practitioners) who all work together to provide you with the care you need, when you need it.  Your next appointment:   6 month(s)  Provider:   Marcie Sever, PA-C         Then, Lauro Portal, MD will plan to see you again in 12  month(s).    We recommend signing up for the patient portal called "MyChart".  Sign up information is provided on this After Visit Summary.  MyChart is used to connect with patients for Virtual Visits (Telemedicine).  Patients are able to view lab/test results, encounter notes, upcoming appointments, etc.   Non-urgent messages can be sent to your provider as well.   To learn more about what you can do with MyChart, go to [email or web address].

## 2023-06-13 NOTE — Assessment & Plan Note (Signed)
 History of moderate right ICA stenosis by duplex ultrasound 04/03/2023 performed preoperatively.  This to be repeated on an annual basis.

## 2023-06-13 NOTE — Assessment & Plan Note (Signed)
 History of CAD status post cardiac catheterization performed by Dr. Nolan Battle 03/06/2023 revealing three-vessel disease.  She ultimately required CABG which was performed by Dr. Deloise Ferries 04/05/2023 with a LIMA to her LAD, vein graft to the PDA and diagonal branches.  Her clinical course was on remarkable.  She is back to work.  She is participating cardiac rehab.  She denies chest pain or shortness of breath.

## 2023-06-13 NOTE — Progress Notes (Signed)
 06/13/2023 [name]   04-25-1960  [id number]  Primary Physician Early, Adriane Albe, NP Primary Cardiologist: Avanell Leigh MD Bennye Bravo, [place]  HPI:  [name] is a 63 y.o.  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  I last saw her in the office 02/27/2023.  She was last seen by Dr. Audery Blazing 8//21 at which time she had a negative stress test.  Her risk factors include remote tobacco abuse having quit 20 years ago with 25 pack years.  She has treated hypertension, diabetes and hyperlipidemia.  Her father died at age 27 of a myocardial infarction.  She is never had a second stroke.  She developed chest pain approximately 6 months ago occurring several times a month characterized as pain in her chest rating to her jaw and left upper extremity.  She also has a history of PAD status post aortobifemoral bypass grafting by Dr. Sabra Cramp in 200  I arrange for her to undergo outpatient cardiac catheterization by Dr. Nolan Battle 03/06/2023 revealing three-vessel disease.  She did have normal LV function by 2D echo.  She underwent CABG x 3 by Dr. Deloise Ferries 04/05/2023 with a LIMA to LAD, vein to the RPDA and diagonal branches.  Her clinical course is uncomplicated.  She is back to work currently and is participating in cardiac rehab.   Current Meds  Medication Sig   acetaminophen  (TYLENOL ) 325 MG tablet Take 2 tablets (650 mg total) by mouth every 6 (six) hours as needed.   ALPRAZolam  (XANAX ) 0.5 MG tablet Take 0.5-1 tablets (0.25-0.5 mg total) by mouth 2 (two) times daily as needed for anxiety.   aspirin  EC 325 MG tablet Take 1 tablet (325 mg total) by mouth daily.   B Complex Vitamins (VITAMIN B COMPLEX PO) Take 1 tablet by mouth daily.   cetirizine  (ZYRTEC ) 10 MG tablet Take 1 tablet (10 mg total) by mouth daily. (Patient taking differently: Take 10 mg  by mouth daily as needed.)   Continuous Glucose Receiver (DEXCOM G7 RECEIVER) DEVI For use with Dexcom G7 sensor   Continuous Glucose Sensor (DEXCOM G7 SENSOR) MISC Apply new sensor every 10 days for continuous glucose monitoring.   fluticasone  (FLONASE ) 50 MCG/ACT nasal spray Place 2 sprays into both nostrils 2 (two) times daily. (Patient taking differently: Place 2 sprays into both nostrils 2 (two) times daily as needed for allergies.)   fluticasone  furoate-vilanterol (BREO ELLIPTA ) 100-25 MCG/ACT AEPB Inhale 1 puff into the lungs daily.   levothyroxine  (SYNTHROID ) 112 MCG tablet Take 1 tablet (112 mcg total) by mouth daily.   lidocaine  4 % Place 1 patch onto the skin daily. Please apply to either side of chest (Patient taking differently: Place 1 patch onto the skin daily as needed. Please apply to either side of chest)   Menthol, Topical Analgesic, (ICY HOT EX) Apply 1 application topically daily as needed (pain).   metoprolol  succinate (TOPROL -XL) 100 MG 24 hr tablet Take 1 tablet (100 mg total) by mouth daily. Take with or immediately following a meal.   nitroGLYCERIN  (NITROSTAT ) 0.4 MG SL tablet Place 1 tablet (0.4 mg total) under the tongue every 5 (five) minutes as needed for chest pain. Max three doses in a row. Proceed to ED for evaluation if no improvement.   pantoprazole  (PROTONIX ) 40 MG tablet Take 1 tablet (40 mg total) by  mouth daily.   Probiotic Product (PROBIOTIC PO) Take 1 capsule by mouth in the morning.   rosuvastatin  (CRESTOR ) 40 MG tablet Take 1 tablet (40 mg total) by mouth daily.   Tiotropium Bromide  Monohydrate (SPIRIVA  RESPIMAT) 2.5 MCG/ACT AERS Inhale 2 puffs into the lungs daily.   tirzepatide  (MOUNJARO ) 12.5 MG/0.5ML Pen Inject 12.5 mg into the skin once a week. (Patient taking differently: Inject 12.5 mg into the skin every Friday.)   traMADol  (ULTRAM ) 50 MG tablet Take 1 tablet (50 mg total) by mouth every 6 (six) hours as needed.   venlafaxine  XR (EFFEXOR -XR) 75 MG 24  hr capsule Take 3 capsules (225 mg total) by mouth daily with breakfast.   Vitamin D , Ergocalciferol , (DRISDOL ) 1.25 MG (50000 UNIT) CAPS capsule Take 1 capsule (50,000 Units total) by mouth every 7 (seven) days. (Patient taking differently: Take 50,000 Units by mouth every Sunday.)     Allergies  Allergen Reactions   Influenza Virus Vacc Split Pf Swelling and Other (See Comments)    Swelling around injection site   Gluten Meal Diarrhea   Lipitor [Atorvastatin ] Other (See Comments)    Dizziness/headache    Social History   Socioeconomic History   Marital status: Married    Spouse name: Not on file   Number of children: 3   Years of education: Not on file   Highest education level: Not on file  Occupational History   Not on file  Tobacco Use   Smoking status: Former    Current packs/day: 0.00    Types: Cigarettes    Quit date: 02/15/2004    Years since quitting: 19.3   Smokeless tobacco: Never  Vaping Use   Vaping status: Never Used  Substance and Sexual Activity   Alcohol use: Yes    Comment: Rarely   Drug use: No   Sexual activity: Not Currently  Other Topics Concern   Not on file  Social History Narrative   Not on file   Social Drivers of Health   Financial Resource Strain: Not on file  Food Insecurity: No Food Insecurity (04/10/2023)   Hunger Vital Sign    Worried About Running Out of Food in the Last Year: Never true    Ran Out of Food in the Last Year: Never true  Transportation Needs: No Transportation Needs (04/10/2023)   PRAPARE - Administrator, Civil Service (Medical): No    Lack of Transportation (Non-Medical): No  Physical Activity: Not on file  Stress: Not on file  Social Connections: Unknown (06/28/2021)   Received from [facility], [facility]   Social Network    Social Network: Not on file  Intimate Partner Violence: Not At Risk (04/10/2023)   Humiliation, Afraid, Rape, and Kick questionnaire    Fear of Current or Ex-Partner: No     Emotionally Abused: No    Physically Abused: No    Sexually Abused: No     Review of Systems: General: negative for chills, fever, night sweats or weight changes.  Cardiovascular: negative for chest pain, dyspnea on exertion, edema, orthopnea, palpitations, paroxysmal nocturnal dyspnea or shortness of breath Dermatological: negative for rash Respiratory: negative for cough or wheezing Urologic: negative for hematuria Abdominal: negative for nausea, vomiting, diarrhea, bright red blood per rectum, melena, or hematemesis Neurologic: negative for visual changes, syncope, or dizziness All other systems reviewed and are otherwise negative except as noted above.    Blood pressure 110/68, pulse 71, height 5\' 4"  (1.626 m), weight 198 lb  9.6 oz (90.1 kg), SpO2 98%.  General appearance: alert and no distress Neck: no adenopathy, no carotid bruit, no JVD, supple, symmetrical, trachea midline, and thyroid  not enlarged, symmetric, no tenderness/mass/nodules Lungs: clear to auscultation bilaterally Heart: regular rate and rhythm, S1, S2 normal, no murmur, click, rub or gallop Extremities: extremities normal, atraumatic, no cyanosis or edema Pulses: 2+ and symmetric Skin: Skin color, texture, turgor normal. No rashes or lesions Neurologic: Grossly normal  EKG not performed today      ASSESSMENT AND PLAN:   Hyperlipidemia associated with type 2 diabetes mellitus (HCC) History of hyperlipidemia on statin therapy with lipid profile performed 11/10/2022 revealing total cholesterol 132, LDL of 63 and HDL of 49.  Hypertension complicating diabetes (HCC) History of essential hypertension with blood pressure measured today 110/68.  She is on metoprolol .  Peripheral vascular disease (HCC) History of PAD PAD status post aortobifemoral bypass grafting by Dr. Azell Boll in 2007.  She has not had Doppler studies in many years.  I am going to check aortoiliac Doppler studies.  Carotid artery disease  (HCC) History of moderate right ICA stenosis by duplex ultrasound 04/03/2023 performed preoperatively.  This to be repeated on an annual basis.  Coronary artery disease History of CAD status post cardiac catheterization performed by Dr. Nolan Battle 03/06/2023 revealing three-vessel disease.  She ultimately required CABG which was performed by Dr. Deloise Ferries 04/05/2023 with a LIMA to her LAD, vein graft to the PDA and diagonal branches.  Her clinical course was on remarkable.  She is back to work.  She is participating cardiac rehab.  She denies chest pain or shortness of breath.     Avanell Leigh MD FACP,FACC,FAHA, [facility] 06/13/2023 1:37 PM

## 2023-06-13 NOTE — Assessment & Plan Note (Signed)
 History of essential hypertension with blood pressure measured today 110/68.  She is on metoprolol .

## 2023-06-13 NOTE — Assessment & Plan Note (Signed)
 History of PAD PAD status post aortobifemoral bypass grafting by Dr. Azell Boll in 2007.  She has not had Doppler studies in many years.  I am going to check aortoiliac Doppler studies.

## 2023-06-14 ENCOUNTER — Ambulatory Visit (HOSPITAL_COMMUNITY)

## 2023-06-14 ENCOUNTER — Encounter (HOSPITAL_COMMUNITY): Admission: RE | Admit: 2023-06-14 | Payer: Self-pay | Source: Ambulatory Visit

## 2023-06-14 ENCOUNTER — Telehealth (HOSPITAL_COMMUNITY): Payer: Self-pay | Admitting: *Deleted

## 2023-06-14 ENCOUNTER — Telehealth: Payer: Self-pay | Admitting: Licensed Clinical Social Worker

## 2023-06-14 ENCOUNTER — Other Ambulatory Visit (HOSPITAL_COMMUNITY): Payer: Self-pay

## 2023-06-14 ENCOUNTER — Telehealth: Payer: Self-pay

## 2023-06-14 NOTE — Telephone Encounter (Signed)
 H&V Care Navigation CSW Progress Note  Clinical Social Worker contacted patient by phone to f/u on self pay status. Note she has cancelled cardiac rehab as well due to insurance changes and may need med assistance. LCSW left voicemail at [phone] this morning. Will re-attempt again as able.  Patient is participating in a Managed Medicaid Plan:  No, self pay only per Rx benefits- there is a card scanned in chart, unsure if active  SDOH Screenings   Food Insecurity: No Food Insecurity (04/10/2023)  Housing: Low Risk  (04/10/2023)  Transportation Needs: No Transportation Needs (04/10/2023)  Utilities: Not At Risk (04/10/2023)  Depression (PHQ2-9): Low Risk  (05/25/2023)  Social Connections: Unknown (06/28/2021)   Received from [facility], [facility]  Tobacco Use: Medium Risk (06/13/2023)    Nathen Balder, MSW, LCSW Clinical Social Worker II [facility] Heart/Vascular Care Navigation  [phone]- work cell phone (preferred)

## 2023-06-14 NOTE — Telephone Encounter (Signed)
 [name] called to inform us  she has a lot going on that she needs to take care of with family and sort out issues with insurance changes that is impacting her ability to fill a couple of her medications. [place] needs to put a hold on cardiac rehab through next week and will try to resume week of May 12th. Rasheedah will follow-up with us  later next week to provide us  a status update.

## 2023-06-14 NOTE — Telephone Encounter (Signed)
 Pharmacy Patient Advocate Encounter  Insurance verification completed.   The patient is insured through [facility]   Ran test claim for MOUNJARO . Currently a quantity of 2 ML is a 28 day supply and the co-pay is $35 . The current 28 day co-pay is, $35.  No PA needed at this time.  This test claim was processed through [facility]- copay amounts may vary at other pharmacies due to pharmacy/plan contracts, or as the patient moves through the different stages of their insurance plan.   Pharmacy benefit info used on test claim: BIN: [id number] PCN: WG ID: [id number] PERSON CODE: [id number]

## 2023-06-15 ENCOUNTER — Other Ambulatory Visit: Payer: Self-pay | Admitting: Nurse Practitioner

## 2023-06-15 ENCOUNTER — Other Ambulatory Visit (HOSPITAL_BASED_OUTPATIENT_CLINIC_OR_DEPARTMENT_OTHER): Payer: Self-pay

## 2023-06-15 ENCOUNTER — Other Ambulatory Visit: Payer: Self-pay

## 2023-06-15 DIAGNOSIS — I10 Essential (primary) hypertension: Secondary | ICD-10-CM

## 2023-06-15 DIAGNOSIS — E118 Type 2 diabetes mellitus with unspecified complications: Secondary | ICD-10-CM

## 2023-06-15 DIAGNOSIS — E1169 Type 2 diabetes mellitus with other specified complication: Secondary | ICD-10-CM

## 2023-06-15 MED ORDER — MOUNJARO 12.5 MG/0.5ML ~~LOC~~ SOAJ
12.5000 mg | SUBCUTANEOUS | 1 refills | Status: AC
Start: 2023-06-15 — End: ?
  Filled 2023-06-15: qty 6, 84d supply, fill #0
  Filled 2023-10-28: qty 6, 84d supply, fill #1

## 2023-06-16 ENCOUNTER — Ambulatory Visit (HOSPITAL_COMMUNITY)

## 2023-06-16 ENCOUNTER — Encounter (HOSPITAL_COMMUNITY): Payer: Self-pay

## 2023-06-16 NOTE — Telephone Encounter (Signed)
 H&V Care Navigation CSW Progress Note  Clinical Social Worker  received call back from pt  to complete assessment. Pt shares their insurance changes in April and she was given two different carriers, a pharmacy plan and a medical plan- she has been unable to get her medication coverage sorted at this time. She feels frustrated since this is a Medical sales representative and she cannot get answers/doesn't feel comfortable providing information to her company about her medications etc (HR dept). Pt resides with husband and pets, he is retired and she still is working currently. LCSW shared options such as [employer] and contacting PCP team for any samples/assistance for inhalers and Mounjaro . I will also connect with our pharmacy team and let her know. Pt appreciative of call.   Pharmacy assistance team able to locate coverage and run a claim for her.   Called pt back to let her know but no answer left voicemail requesting return call. Will re-attempt again as able next week.  Patient is participating in a Managed Medicaid Plan:  No, BCBS  SDOH Screenings   Food Insecurity: No Food Insecurity (04/10/2023)  Housing: Low Risk  (04/10/2023)  Transportation Needs: No Transportation Needs (04/10/2023)  Utilities: Not At Risk (04/10/2023)  Depression (PHQ2-9): Low Risk  (05/25/2023)  Social Connections: Unknown (06/28/2021)   Received from [facility], [facility]  Tobacco Use: Medium Risk (06/13/2023)    Nathen Balder, MSW, LCSW Clinical Social Worker II [facility] Heart/Vascular Care Navigation  [phone]- work cell phone (preferred)

## 2023-06-19 ENCOUNTER — Telehealth: Payer: Self-pay | Admitting: Licensed Clinical Social Worker

## 2023-06-19 ENCOUNTER — Ambulatory Visit (HOSPITAL_COMMUNITY)

## 2023-06-19 ENCOUNTER — Encounter (HOSPITAL_COMMUNITY): Payer: Self-pay

## 2023-06-19 NOTE — Telephone Encounter (Signed)
 H&V Care Navigation CSW Progress Note  Clinical Social Worker contacted patient by phone to f/u again on medication assistance concerns. No answer today again at [phone], left voicemail. Will re-attempt again later this week if no return call.  Patient is participating in a Managed Medicaid Plan:  No, BCBS Commercial plan  SDOH Screenings   Food Insecurity: No Food Insecurity (04/10/2023)  Housing: Low Risk  (04/10/2023)  Transportation Needs: No Transportation Needs (04/10/2023)  Utilities: Not At Risk (04/10/2023)  Depression (PHQ2-9): Low Risk  (05/25/2023)  Social Connections: Unknown (06/28/2021)   Received from [facility], [facility]  Tobacco Use: Medium Risk (06/13/2023)     Nathen Balder, MSW, LCSW Clinical Social Worker II [facility] Heart/Vascular Care Navigation  [phone]- work cell phone (preferred)

## 2023-06-21 ENCOUNTER — Ambulatory Visit (HOSPITAL_COMMUNITY)

## 2023-06-21 ENCOUNTER — Encounter (HOSPITAL_COMMUNITY): Payer: Self-pay

## 2023-06-23 ENCOUNTER — Ambulatory Visit (HOSPITAL_COMMUNITY)

## 2023-06-23 ENCOUNTER — Other Ambulatory Visit (HOSPITAL_BASED_OUTPATIENT_CLINIC_OR_DEPARTMENT_OTHER): Payer: Self-pay

## 2023-06-23 ENCOUNTER — Other Ambulatory Visit: Payer: Self-pay

## 2023-06-23 ENCOUNTER — Encounter (HOSPITAL_COMMUNITY): Payer: Self-pay

## 2023-06-26 ENCOUNTER — Encounter (HOSPITAL_COMMUNITY): Admission: RE | Admit: 2023-06-26 | Payer: Self-pay | Source: Ambulatory Visit

## 2023-06-26 ENCOUNTER — Encounter: Payer: Self-pay | Admitting: Nurse Practitioner

## 2023-06-26 ENCOUNTER — Other Ambulatory Visit: Payer: Self-pay

## 2023-06-26 ENCOUNTER — Other Ambulatory Visit (HOSPITAL_BASED_OUTPATIENT_CLINIC_OR_DEPARTMENT_OTHER): Payer: Self-pay

## 2023-06-26 ENCOUNTER — Ambulatory Visit (HOSPITAL_COMMUNITY)

## 2023-06-26 DIAGNOSIS — I10 Essential (primary) hypertension: Secondary | ICD-10-CM

## 2023-06-26 MED ORDER — BENAZEPRIL HCL 10 MG PO TABS
10.0000 mg | ORAL_TABLET | Freq: Every day | ORAL | 1 refills | Status: DC
  Filled 2023-06-26: qty 90, 90d supply, fill #0
  Filled 2023-10-20: qty 90, 90d supply, fill #1

## 2023-06-28 ENCOUNTER — Encounter (HOSPITAL_COMMUNITY)
Admission: RE | Admit: 2023-06-28 | Discharge: 2023-06-28 | Disposition: A | Discharge status: Home / Self Care | Payer: Self-pay | Source: Ambulatory Visit | Attending: Cardiovascular Disease | Admitting: Cardiovascular Disease

## 2023-06-28 ENCOUNTER — Other Ambulatory Visit (HOSPITAL_BASED_OUTPATIENT_CLINIC_OR_DEPARTMENT_OTHER): Payer: Self-pay

## 2023-06-28 ENCOUNTER — Ambulatory Visit (HOSPITAL_COMMUNITY)

## 2023-06-28 DIAGNOSIS — Z951 Presence of aortocoronary bypass graft: Secondary | ICD-10-CM | POA: Insufficient documentation

## 2023-06-29 ENCOUNTER — Other Ambulatory Visit: Payer: Self-pay

## 2023-06-29 ENCOUNTER — Other Ambulatory Visit (HOSPITAL_COMMUNITY): Payer: Self-pay

## 2023-06-29 ENCOUNTER — Telehealth: Payer: Self-pay

## 2023-06-29 NOTE — Telephone Encounter (Signed)
 Pharmacy Patient Advocate Encounter  Insurance verification completed.   The patient is insured through [employer] test claim for [employer]. Currently a quantity of 3 is a 30 day supply and the co-pay is RTS, AS THE RX WAS LAST FILLED 06/23/23 .   This test claim was processed through [facility]- copay amounts may vary at other pharmacies due to pharmacy/plan contracts, or as the patient moves through the different stages of their insurance plan.

## 2023-06-30 ENCOUNTER — Encounter (HOSPITAL_COMMUNITY): Admission: RE | Admit: 2023-06-30 | Payer: Self-pay | Source: Ambulatory Visit

## 2023-06-30 ENCOUNTER — Ambulatory Visit (HOSPITAL_COMMUNITY)

## 2023-06-30 ENCOUNTER — Encounter (HOSPITAL_COMMUNITY): Payer: Self-pay | Admitting: *Deleted

## 2023-06-30 ENCOUNTER — Telehealth (HOSPITAL_COMMUNITY): Payer: Self-pay | Admitting: *Deleted

## 2023-06-30 DIAGNOSIS — Z951 Presence of aortocoronary bypass graft: Secondary | ICD-10-CM

## 2023-06-30 NOTE — Progress Notes (Signed)
 Discharge Progress Report  Patient Details  Name: [name] MRN: [id number] Date of Birth: Jul 07, 1960 Referring Provider:   Flowsheet Row INTENSIVE CARDIAC REHAB ORIENT from 05/23/2023 in [facility]  Referring Provider Avanell Leigh, MD        Number of Visits: 2 exercise sessions/ 2 education sessions  Reason for Discharge:  Early Exit:  Back to work, Personal, and Lack of attendance  Smoking History:  Social History   Tobacco Use  Smoking Status Former   Current packs/day: 0.00   Types: Cigarettes   Quit date: 02/15/2004   Years since quitting: 19.3  Smokeless Tobacco Never    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  ADL UCSD:   Initial Exercise Prescription:  Initial Exercise Prescription - 05/23/23 1000       Date of Initial Exercise RX and Referring Provider   Date 05/23/23    Referring Provider Avanell Leigh, MD    Expected Discharge Date 08/23/23      Treadmill   MPH 2    Grade 0    Minutes 15    METs 2.53      NuStep   Level 1    SPM 85    Minutes 15    METs 2.2      Prescription Details   Frequency (times per week) 3    Duration Progress to 30 minutes of continuous aerobic without signs/symptoms of physical distress      Intensity   THRR 40-80% of Max Heartrate 63-126    Ratings of Perceived Exertion 11-13    Perceived Dyspnea 0-4      Progression   Progression Continue to progress workloads to maintain intensity without signs/symptoms of physical distress.      Resistance Training   Training Prescription Yes    Weight 2 lbs    Reps 10-15             Discharge Exercise Prescription (Final Exercise Prescription Changes):  Exercise Prescription Changes - 06/02/23 0811       Response to Exercise   Blood Pressure (Admit) 114/74    Blood Pressure (Exercise) 122/64    Blood Pressure (Exit) 118/64    Heart Rate (Admit) 83 bpm    Heart Rate (Exercise) 99 bpm    Heart Rate  (Exit) 72 bpm    Rating of Perceived Exertion (Exercise) 10    Perceived Dyspnea (Exercise) 0    Symptoms none    Comments Pt first day in the [facility] I[facility] program    Duration Progress to 30 minutes of  aerobic without signs/symptoms of physical distress    Intensity THRR unchanged      Progression   Progression Continue to progress workloads to maintain intensity without signs/symptoms of physical distress.    Average METs 1.97      Resistance Training   Training Prescription Yes    Weight 2 lbs    Reps 10-15    Time 10 Minutes      Treadmill   MPH 2    Grade 0    Minutes 15    METs 2.53      NuStep   Level 1    SPM 58    Minutes 15    METs 1.4             Functional Capacity:  6 Minute Walk     Row Name 05/23/23 [phone]  6 Minute Walk   Phase Initial     Distance 1310 feet     Walk Time 6 minutes     # of Rest Breaks 0     MPH 2.48     METS 2.9     RPE 12     Perceived Dyspnea  1     VO2 Peak 10.16     Symptoms Yes (comment)     Comments Mild shortness of breath. discomfort in neck/ cervical spine into chest area. She has chronic cervicalgia and also felt may be related to anxiety. resolved with rest.     Resting HR 84 bpm     Resting BP 144/80     Resting Oxygen Saturation  98 %     Exercise Oxygen Saturation  during 6 min walk 97 %     Max Ex. HR 90 bpm     Max Ex. BP 132/82     2 Minute Post BP 138/86              Psychological, QOL, Others - Outcomes: PHQ 2/9:    05/25/2023   11:29 AM 05/23/2023    8:29 AM 06/14/2021    3:47 PM 05/11/2020    3:01 PM 09/12/2018    1:08 PM  Depression screen PHQ 2/9  Decreased Interest 0 0 0 0 0  Down, Depressed, Hopeless 0 0 0 0 0  PHQ - 2 Score 0 0 0 0 0  Altered sleeping  2  3 2   Tired, decreased energy  1   1  Change in appetite  1  0 0  Feeling bad or failure about yourself   0  0 0  Trouble concentrating  0  0 0  Moving slowly or fidgety/restless  0  0 0  Suicidal thoughts  0  0 0  PHQ-9  Score  4  3 3   Difficult doing work/chores  Not difficult at all  Not difficult at all Not difficult at all    Quality of Life:  Quality of Life - 05/23/23 0842       Quality of Life   Select Quality of Life      Quality of Life Scores   Health/Function Pre 23.2 %    Socioeconomic Pre 18 %    Psych/Spiritual Pre 22.29 %    Family Pre 18 %    GLOBAL Pre 21.27 %             Personal Goals: Goals established at orientation with interventions provided to work toward goal.  Personal Goals and Risk Factors at Admission - 05/23/23 0758       Core Components/Risk Factors/Patient Goals on Admission    Weight Management Yes;Obesity;Weight Loss    Intervention Weight Management/Obesity: Establish reasonable short term and long term weight goals.;Obesity: Provide education and appropriate resources to help participant work on and attain dietary goals.    Admit Weight 194 lb 14.2 oz (88.4 kg)    Expected Outcomes Short Term: Continue to assess and modify interventions until short term weight is achieved;Long Term: Adherence to nutrition and physical activity/exercise program aimed toward attainment of established weight goal;Weight Loss: Understanding of general recommendations for a balanced deficit meal plan, which promotes 1-2 lb weight loss per week and includes a negative energy balance of [phone] kcal/d    Diabetes Yes    Intervention Provide education about signs/symptoms and action to take for hypo/hyperglycemia.;Provide education about proper nutrition, including hydration, and aerobic/resistive exercise  prescription along with prescribed medications to achieve blood glucose in normal ranges: Fasting glucose 65-99 mg/dL    Expected Outcomes Short Term: Participant verbalizes understanding of the signs/symptoms and immediate care of hyper/hypoglycemia, proper foot care and importance of medication, aerobic/resistive exercise and nutrition plan for blood glucose control.;Long Term:  Attainment of HbA1C < 7%.    Hypertension Yes    Intervention Provide education on lifestyle modifcations including regular physical activity/exercise, weight management, moderate sodium restriction and increased consumption of fresh fruit, vegetables, and low fat dairy, alcohol moderation, and smoking cessation.;Monitor prescription use compliance.    Expected Outcomes Short Term: Continued assessment and intervention until BP is < 140/29mm HG in hypertensive participants. < 130/5mm HG in hypertensive participants with diabetes, heart failure or chronic kidney disease.;Long Term: Maintenance of blood pressure at goal levels.    Lipids Yes    Intervention Provide education and support for participant on nutrition & aerobic/resistive exercise along with prescribed medications to achieve LDL 70mg , HDL >40mg .    Expected Outcomes Short Term: Participant states understanding of desired cholesterol values and is compliant with medications prescribed. Participant is following exercise prescription and nutrition guidelines.;Long Term: Cholesterol controlled with medications as prescribed, with individualized exercise RX and with personalized nutrition plan. Value goals: LDL < 70mg , HDL > 40 mg.    Stress Yes    Intervention Offer individual and/or small group education and counseling on adjustment to heart disease, stress management and health-related lifestyle change. Teach and support self-help strategies.;Refer participants experiencing significant psychosocial distress to appropriate mental health specialists for further evaluation and treatment. When possible, include family members and significant others in education/counseling sessions.    Expected Outcomes Short Term: Participant demonstrates changes in health-related behavior, relaxation and other stress management skills, ability to obtain effective social support, and compliance with psychotropic medications if prescribed.;Long Term: Emotional  wellbeing is indicated by absence of clinically significant psychosocial distress or social isolation.              Personal Goals Discharge:  Goals and Risk Factor Review     Row Name 06/06/23 [phone]             Core Components/Risk Factors/Patient Goals Review   Personal Goals Review Weight Management/Obesity;Stress;Hypertension;Lipids;Diabetes       Review Anyeli started cardiac rehab on 04.18/25. Rosalee did well with exercise for her fitness level. Vital signs and CBG's were stable.       Expected Outcomes Kamron will continue to participate in cardiac rehab for exercise, nutrition and lifestyle modifications                Exercise Goals and Review:  Exercise Goals     Row Name 05/23/23 0756             Exercise Goals   Increase Physical Activity Yes       Intervention Provide advice, education, support and counseling about physical activity/exercise needs.;Develop an individualized exercise prescription for aerobic and resistive training based on initial evaluation findings, risk stratification, comorbidities and participant's personal goals.       Expected Outcomes Short Term: Attend rehab on a regular basis to increase amount of physical activity.;Long Term: Exercising regularly at least 3-5 days a week.;Long Term: Add in home exercise to make exercise part of routine and to increase amount of physical activity.       Increase Strength and Stamina Yes       Intervention Provide advice, education, support and counseling about physical activity/exercise needs.;Develop  an individualized exercise prescription for aerobic and resistive training based on initial evaluation findings, risk stratification, comorbidities and participant's personal goals.       Expected Outcomes Short Term: Increase workloads from initial exercise prescription for resistance, speed, and METs.;Short Term: Perform resistance training exercises routinely during rehab and add in resistance training at  home;Long Term: Improve cardiorespiratory fitness, muscular endurance and strength as measured by increased METs and functional capacity ( )       Able to understand and use rate of perceived exertion (RPE) scale Yes       Intervention Provide education and explanation on how to use RPE scale       Expected Outcomes Short Term: Able to use RPE daily in rehab to express subjective intensity level;Long Term:  Able to use RPE to guide intensity level when exercising independently       Knowledge and understanding of Target Heart Rate Range (THRR) Yes       Intervention Provide education and explanation of THRR including how the numbers were predicted and where they are located for reference       Expected Outcomes Short Term: Able to state/look up THRR;Long Term: Able to use THRR to govern intensity when exercising independently;Short Term: Able to use daily as guideline for intensity in rehab       Able to check pulse independently Yes       Intervention Provide education and demonstration on how to check pulse in carotid and radial arteries.;Review the importance of being able to check your own pulse for safety during independent exercise       Expected Outcomes Short Term: Able to explain why pulse checking is important during independent exercise;Long Term: Able to check pulse independently and accurately       Understanding of Exercise Prescription Yes       Intervention Provide education, explanation, and written materials on patient's individual exercise prescription       Expected Outcomes Short Term: Able to explain program exercise prescription;Long Term: Able to explain home exercise prescription to exercise independently                Exercise Goals Re-Evaluation:  Exercise Goals Re-Evaluation     Row Name 06/02/23 0815             Exercise Goal Re-Evaluation   Exercise Goals Review Increase Physical Activity;Understanding of Exercise Prescription;Increase Strength and  Stamina;Knowledge and understanding of Target Heart Rate Range (THRR);Able to understand and use rate of perceived exertion (RPE) scale       Comments Pt first day in the [facility] I[facility] program. Pt tolerated exercise well with an average MEt level of 1.97. Pt is off to a good start and is learning her THRR, RPE and ExRx       Expected Outcomes Will continue to monitor pt and progress workloads as tolerated without sign or symptom                Nutrition & Weight - Outcomes:  Pre Biometrics - 05/23/23 0749       Pre Biometrics   Waist Circumference 44.75 inches    Hip Circumference 45.25 inches    Waist to Hip Ratio 0.99 %    Triceps Skinfold 34 mm    % Body Fat 45.5 %    Grip Strength 22 kg    Flexibility 10.5 in    Single Leg Stand 30 seconds  Nutrition:  Nutrition Therapy & Goals - 06/29/23 1049       Nutrition Therapy   Diet Heart Healthy Diet    Drug/Food Interactions Statins/Certain Fruits      Personal Nutrition Goals   Nutrition Goal Patient to identify strategies for reducing cardiovascular risk by attending the [facility] education and nutrition series weekly.   goal not met.   Personal Goal #2 Patient to improve diet quality by using the plate method as a guide for meal planning to include lean protein/plant protein, fruits, vegetables, whole grains, nonfat dairy as part of a well-balanced diet.   goal not met.   Personal Goal #3 Patient to identify strategies for weight loss of 0.5-2.0# per week.   goal not met.   Comments Goals not met. Patient has medical history of HTN, dyslipidemia, DM2, CABGx2. She has not attended ICR since 06/05/23. She does not regularly attend the [facility] education/nutrition series. A1c is well controlled at goal (mounjaro ).Lipids are at goal. Mikeila is motivated to lose weight; however, per last documentated weight, she is up 5.1# since starting with our program.Patient will benefit from participation in intensive cardiac rehab  for nutrition, exercise, and lifestyle modification.      Intervention Plan   Intervention Prescribe, educate and counsel regarding individualized specific dietary modifications aiming towards targeted core components such as weight, hypertension, lipid management, diabetes, heart failure and other comorbidities.;Nutrition handout(s) given to patient.    Expected Outcomes Short Term Goal: Understand basic principles of dietary content, such as calories, fat, sodium, cholesterol and nutrients.;Long Term Goal: Adherence to prescribed nutrition plan.             Nutrition Discharge:   Education Questionnaire Score:  Knowledge Questionnaire Score - 05/23/23 0832       Knowledge Questionnaire Score   Pre Score 21/24             Spoke with Mackynzi this morning, at this time it is very challenging to carve out time to attend the cardiac rehab sessions. Avalia shared she has already made healthy food choices and understands the importance of routine exercise. Will discharge from the program.  Dr. Gaylyn Keas is the Medical Director for Cardiac Rehabilitation

## 2023-07-03 ENCOUNTER — Ambulatory Visit: Payer: Self-pay | Admitting: Family Medicine

## 2023-07-03 ENCOUNTER — Ambulatory Visit (HOSPITAL_COMMUNITY)

## 2023-07-03 ENCOUNTER — Encounter (HOSPITAL_COMMUNITY): Payer: Self-pay

## 2023-07-03 ENCOUNTER — Other Ambulatory Visit: Payer: Self-pay

## 2023-07-03 VITALS — BP 140/80 | HR 84 | Ht 64.0 in | Wt 199.2 lb

## 2023-07-03 DIAGNOSIS — M25561 Pain in right knee: Secondary | ICD-10-CM | POA: Diagnosis not present

## 2023-07-03 DIAGNOSIS — G8929 Other chronic pain: Secondary | ICD-10-CM

## 2023-07-03 NOTE — Progress Notes (Signed)
   I, Leone Ralphs am a scribe for Dr. Garlan Juniper, MD.  [name] is a 63 y.o. female who presents to [facility] at [facility] today for exacerbation of right knee pain s/p fall. Pt was last seen by Dr. Alease Hunter on 12/01/23 for right knee pain, received intra-articular steroid injection, and was referred to PT. Since that time, pt was admitted to the ED on 04/05/23 for coronary artery bypass grafting.   Today, pt reports had a lot of pain last week. Still having a lot of pain in the sternum area after the surgery. Only went on a small roller coaster with her grandson. Patient has not done anything else out of the regular routine.   Dx imaging: 05/25/2020 R knee & R hip XR   Pertinent review of systems: No fevers or chills  Relevant historical information: Hypertension and diabetes   Exam:  BP (!) 140/80   Pulse 84   Ht 5\' 4"  (1.626 m)   Wt 199 lb 3.2 oz (90.4 kg)   SpO2 98%   BMI 34.19 kg/m  General: Well Developed, well nourished, and in no acute distress.   MSK: Right knee mild effusion normal motion with crepitation intact strength.    Lab and Radiology Results  Procedure: Real-time Ultrasound Guided Injection of right knee joint superior lateral patella space Device: Philips Affiniti 50G/GE Logiq Images permanently stored and available for review in PACS Verbal informed consent obtained.  Discussed risks and benefits of procedure. Warned about infection, bleeding, hyperglycemia damage to structures among others. Patient expresses understanding and agreement Time-out conducted.   Noted no overlying erythema, induration, or other signs of local infection.   Skin prepped in a sterile fashion.   Local anesthesia: Topical Ethyl chloride.   With sterile technique and under real time ultrasound guidance: 40 mg of Kenalog  and 2 mL of Marcaine  injected into knee joint. Fluid seen entering the joint capsule.   Completed without difficulty   Pain immediately  resolved suggesting accurate placement of the medication.   Advised to call if fevers/chills, erythema, induration, drainage, or persistent bleeding.   Images permanently stored and available for review in the ultrasound unit.  Impression: Technically successful ultrasound guided injection.          Assessment and Plan: 63 y.o. 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  Plan for repeat steroid injection today.  Check back as needed.   PDMP not reviewed this encounter. Orders Placed This Encounter  Procedures   US  LIMITED JOINT SPACE STRUCTURES LOW RIGHT(NO LINKED CHARGES)    Reason for Exam (SYMPTOM  OR DIAGNOSIS REQUIRED):   right knee pain    Preferred imaging location?:   [facility]   No orders of the defined types were placed in this encounter.    Discussed warning signs or symptoms. Please see discharge instructions. Patient expresses understanding.   The above documentation has been reviewed and is accurate and complete Garlan Juniper, M.D.

## 2023-07-03 NOTE — Patient Instructions (Signed)
 Thank you for coming in today. Injected knee today.

## 2023-07-05 ENCOUNTER — Ambulatory Visit (HOSPITAL_COMMUNITY)

## 2023-07-05 ENCOUNTER — Encounter (HOSPITAL_COMMUNITY): Payer: Self-pay

## 2023-07-06 ENCOUNTER — Other Ambulatory Visit (HOSPITAL_BASED_OUTPATIENT_CLINIC_OR_DEPARTMENT_OTHER): Payer: Self-pay

## 2023-07-06 ENCOUNTER — Other Ambulatory Visit: Payer: Self-pay

## 2023-07-07 ENCOUNTER — Encounter (HOSPITAL_COMMUNITY): Payer: Self-pay

## 2023-07-07 ENCOUNTER — Other Ambulatory Visit (HOSPITAL_BASED_OUTPATIENT_CLINIC_OR_DEPARTMENT_OTHER): Payer: Self-pay

## 2023-07-07 ENCOUNTER — Ambulatory Visit (HOSPITAL_COMMUNITY)

## 2023-07-12 ENCOUNTER — Ambulatory Visit (HOSPITAL_COMMUNITY)

## 2023-07-12 ENCOUNTER — Encounter (HOSPITAL_COMMUNITY): Payer: Self-pay

## 2023-07-13 ENCOUNTER — Encounter (HOSPITAL_COMMUNITY): Payer: Self-pay

## 2023-07-13 ENCOUNTER — Other Ambulatory Visit (HOSPITAL_BASED_OUTPATIENT_CLINIC_OR_DEPARTMENT_OTHER): Payer: Self-pay

## 2023-07-13 ENCOUNTER — Other Ambulatory Visit: Payer: Self-pay

## 2023-07-13 ENCOUNTER — Other Ambulatory Visit: Payer: Self-pay | Admitting: Nurse Practitioner

## 2023-07-13 DIAGNOSIS — E559 Vitamin D deficiency, unspecified: Secondary | ICD-10-CM

## 2023-07-13 NOTE — Telephone Encounter (Signed)
 Does she need to remain on her Vit D?

## 2023-07-14 ENCOUNTER — Other Ambulatory Visit: Payer: Self-pay

## 2023-07-14 ENCOUNTER — Other Ambulatory Visit (HOSPITAL_BASED_OUTPATIENT_CLINIC_OR_DEPARTMENT_OTHER): Payer: Self-pay

## 2023-07-14 ENCOUNTER — Encounter (HOSPITAL_COMMUNITY): Payer: Self-pay

## 2023-07-14 ENCOUNTER — Ambulatory Visit (HOSPITAL_COMMUNITY)

## 2023-07-14 MED ORDER — VITAMIN D (ERGOCALCIFEROL) 1.25 MG (50000 UNIT) PO CAPS
50000.0000 [IU] | ORAL_CAPSULE | ORAL | 0 refills | Status: DC
Start: 2023-07-14 — End: 2023-10-01
  Filled 2023-07-14: qty 12, 84d supply, fill #0

## 2023-07-17 ENCOUNTER — Encounter (HOSPITAL_COMMUNITY): Payer: Self-pay

## 2023-07-17 ENCOUNTER — Ambulatory Visit (HOSPITAL_COMMUNITY)

## 2023-07-18 ENCOUNTER — Other Ambulatory Visit (HOSPITAL_BASED_OUTPATIENT_CLINIC_OR_DEPARTMENT_OTHER): Payer: Self-pay

## 2023-07-18 ENCOUNTER — Other Ambulatory Visit (HOSPITAL_COMMUNITY): Payer: Self-pay

## 2023-07-18 ENCOUNTER — Other Ambulatory Visit: Payer: Self-pay

## 2023-07-19 ENCOUNTER — Encounter (HOSPITAL_COMMUNITY): Payer: Self-pay

## 2023-07-19 ENCOUNTER — Ambulatory Visit (HOSPITAL_COMMUNITY)

## 2023-07-21 ENCOUNTER — Ambulatory Visit (HOSPITAL_COMMUNITY)

## 2023-07-21 ENCOUNTER — Encounter (HOSPITAL_COMMUNITY): Payer: Self-pay

## 2023-07-24 ENCOUNTER — Encounter (HOSPITAL_COMMUNITY): Payer: Self-pay

## 2023-07-24 ENCOUNTER — Ambulatory Visit (HOSPITAL_COMMUNITY)

## 2023-07-26 ENCOUNTER — Encounter (HOSPITAL_COMMUNITY): Payer: Self-pay

## 2023-07-26 ENCOUNTER — Ambulatory Visit (HOSPITAL_COMMUNITY)

## 2023-07-27 ENCOUNTER — Ambulatory Visit: Admitting: Physician Assistant

## 2023-07-28 ENCOUNTER — Encounter (HOSPITAL_COMMUNITY): Payer: Self-pay

## 2023-07-28 ENCOUNTER — Ambulatory Visit (HOSPITAL_COMMUNITY)

## 2023-07-31 ENCOUNTER — Other Ambulatory Visit: Payer: Self-pay

## 2023-07-31 ENCOUNTER — Ambulatory Visit (HOSPITAL_COMMUNITY)

## 2023-07-31 ENCOUNTER — Encounter (HOSPITAL_COMMUNITY): Payer: Self-pay

## 2023-08-02 ENCOUNTER — Encounter (HOSPITAL_COMMUNITY): Payer: Self-pay

## 2023-08-02 ENCOUNTER — Ambulatory Visit (HOSPITAL_COMMUNITY)

## 2023-08-04 ENCOUNTER — Ambulatory Visit (HOSPITAL_COMMUNITY)

## 2023-08-04 ENCOUNTER — Encounter (HOSPITAL_COMMUNITY): Payer: Self-pay

## 2023-08-07 ENCOUNTER — Ambulatory Visit (HOSPITAL_COMMUNITY)

## 2023-08-07 ENCOUNTER — Encounter (HOSPITAL_COMMUNITY): Payer: Self-pay

## 2023-08-09 ENCOUNTER — Encounter (HOSPITAL_COMMUNITY): Payer: Self-pay

## 2023-08-09 ENCOUNTER — Ambulatory Visit (HOSPITAL_COMMUNITY)

## 2023-08-10 ENCOUNTER — Other Ambulatory Visit: Payer: Self-pay

## 2023-08-10 ENCOUNTER — Other Ambulatory Visit (HOSPITAL_BASED_OUTPATIENT_CLINIC_OR_DEPARTMENT_OTHER): Payer: Self-pay

## 2023-08-11 ENCOUNTER — Encounter (HOSPITAL_COMMUNITY): Payer: Self-pay

## 2023-08-14 ENCOUNTER — Encounter (HOSPITAL_COMMUNITY): Payer: Self-pay

## 2023-08-16 ENCOUNTER — Encounter (HOSPITAL_COMMUNITY): Payer: Self-pay

## 2023-08-21 ENCOUNTER — Encounter (HOSPITAL_COMMUNITY): Payer: Self-pay

## 2023-08-23 ENCOUNTER — Encounter (HOSPITAL_COMMUNITY): Payer: Self-pay

## 2023-09-01 ENCOUNTER — Other Ambulatory Visit: Payer: Self-pay | Admitting: Nurse Practitioner

## 2023-09-01 DIAGNOSIS — F418 Other specified anxiety disorders: Secondary | ICD-10-CM

## 2023-09-02 ENCOUNTER — Other Ambulatory Visit (HOSPITAL_BASED_OUTPATIENT_CLINIC_OR_DEPARTMENT_OTHER): Payer: Self-pay

## 2023-09-04 ENCOUNTER — Other Ambulatory Visit: Payer: Self-pay

## 2023-09-04 NOTE — Telephone Encounter (Signed)
 Per note in prescription request pts. Does not need look like she still has refills still on this.

## 2023-09-05 ENCOUNTER — Other Ambulatory Visit (HOSPITAL_BASED_OUTPATIENT_CLINIC_OR_DEPARTMENT_OTHER): Payer: Self-pay

## 2023-09-06 ENCOUNTER — Other Ambulatory Visit (HOSPITAL_BASED_OUTPATIENT_CLINIC_OR_DEPARTMENT_OTHER): Payer: Self-pay

## 2023-09-06 MED ORDER — ALPRAZOLAM 0.5 MG PO TABS
ORAL_TABLET | ORAL | 3 refills | Status: DC
  Filled 2023-09-06: qty 60, fill #0

## 2023-09-14 ENCOUNTER — Other Ambulatory Visit (HOSPITAL_BASED_OUTPATIENT_CLINIC_OR_DEPARTMENT_OTHER): Payer: Self-pay

## 2023-09-15 ENCOUNTER — Other Ambulatory Visit (HOSPITAL_BASED_OUTPATIENT_CLINIC_OR_DEPARTMENT_OTHER): Payer: Self-pay

## 2023-09-18 ENCOUNTER — Inpatient Hospital Stay (HOSPITAL_COMMUNITY): Admission: RE | Admit: 2023-09-18 | Payer: Self-pay | Source: Ambulatory Visit

## 2023-09-20 ENCOUNTER — Ambulatory Visit (HOSPITAL_COMMUNITY)
Admission: RE | Admit: 2023-09-20 | Discharge: 2023-09-20 | Disposition: A | Discharge status: Home / Self Care | Payer: Self-pay | Source: Ambulatory Visit | Attending: Cardiovascular Disease | Admitting: Cardiovascular Disease

## 2023-09-20 ENCOUNTER — Ambulatory Visit (HOSPITAL_BASED_OUTPATIENT_CLINIC_OR_DEPARTMENT_OTHER)
Admission: RE | Admit: 2023-09-20 | Discharge: 2023-09-20 | Disposition: A | Discharge status: Home / Self Care | Source: Ambulatory Visit | Attending: Cardiology | Admitting: Cardiology

## 2023-09-20 ENCOUNTER — Ambulatory Visit: Payer: Self-pay | Admitting: Cardiovascular Disease

## 2023-09-20 ENCOUNTER — Ambulatory Visit (HOSPITAL_BASED_OUTPATIENT_CLINIC_OR_DEPARTMENT_OTHER)
Admission: RE | Admit: 2023-09-20 | Discharge: 2023-09-20 | Discharge status: Home / Self Care | Payer: Self-pay | Source: Ambulatory Visit | Attending: Cardiovascular Disease | Admitting: Cardiovascular Disease

## 2023-09-20 ENCOUNTER — Ambulatory Visit: Payer: Self-pay | Admitting: Cardiology

## 2023-09-20 DIAGNOSIS — Z95820 Peripheral vascular angioplasty status with implants and grafts: Secondary | ICD-10-CM | POA: Diagnosis not present

## 2023-09-20 DIAGNOSIS — I6523 Occlusion and stenosis of bilateral carotid arteries: Secondary | ICD-10-CM

## 2023-09-20 DIAGNOSIS — I6521 Occlusion and stenosis of right carotid artery: Secondary | ICD-10-CM | POA: Diagnosis not present

## 2023-09-20 DIAGNOSIS — I739 Peripheral vascular disease, unspecified: Secondary | ICD-10-CM | POA: Insufficient documentation

## 2023-09-20 LAB — VAS US ABI WITH/WO TBI
Left ABI: 0.92
Right ABI: 0.94

## 2023-09-21 ENCOUNTER — Other Ambulatory Visit (HOSPITAL_BASED_OUTPATIENT_CLINIC_OR_DEPARTMENT_OTHER): Payer: Self-pay

## 2023-09-22 ENCOUNTER — Other Ambulatory Visit (HOSPITAL_BASED_OUTPATIENT_CLINIC_OR_DEPARTMENT_OTHER): Payer: Self-pay

## 2023-09-26 ENCOUNTER — Other Ambulatory Visit (HOSPITAL_BASED_OUTPATIENT_CLINIC_OR_DEPARTMENT_OTHER): Payer: Self-pay

## 2023-10-01 ENCOUNTER — Other Ambulatory Visit: Payer: Self-pay | Admitting: Nurse Practitioner

## 2023-10-01 ENCOUNTER — Other Ambulatory Visit (HOSPITAL_BASED_OUTPATIENT_CLINIC_OR_DEPARTMENT_OTHER): Payer: Self-pay

## 2023-10-01 DIAGNOSIS — E559 Vitamin D deficiency, unspecified: Secondary | ICD-10-CM

## 2023-10-02 ENCOUNTER — Other Ambulatory Visit (HOSPITAL_BASED_OUTPATIENT_CLINIC_OR_DEPARTMENT_OTHER): Payer: Self-pay

## 2023-10-02 ENCOUNTER — Other Ambulatory Visit: Payer: Self-pay

## 2023-10-02 MED ORDER — VITAMIN D (ERGOCALCIFEROL) 1.25 MG (50000 UNIT) PO CAPS
50000.0000 [IU] | ORAL_CAPSULE | ORAL | 3 refills | Status: AC
  Filled 2023-10-02: qty 12, 84d supply, fill #0
  Filled 2023-12-22: qty 12, 84d supply, fill #1
  Filled 2024-03-15 – 2024-03-20 (×2): qty 12, 84d supply, fill #2

## 2023-10-02 NOTE — Telephone Encounter (Signed)
 Does she need to remain on this.

## 2023-10-03 ENCOUNTER — Other Ambulatory Visit (HOSPITAL_BASED_OUTPATIENT_CLINIC_OR_DEPARTMENT_OTHER): Payer: Self-pay

## 2023-10-08 ENCOUNTER — Encounter: Payer: Self-pay | Admitting: Nurse Practitioner

## 2023-10-09 ENCOUNTER — Other Ambulatory Visit (HOSPITAL_BASED_OUTPATIENT_CLINIC_OR_DEPARTMENT_OTHER): Payer: Self-pay

## 2023-10-09 ENCOUNTER — Other Ambulatory Visit: Payer: Self-pay | Admitting: Nurse Practitioner

## 2023-10-09 DIAGNOSIS — M19011 Primary osteoarthritis, right shoulder: Secondary | ICD-10-CM

## 2023-10-09 MED ORDER — CELECOXIB 200 MG PO CAPS
200.0000 mg | ORAL_CAPSULE | Freq: Every day | ORAL | 3 refills | Status: AC
  Filled 2023-10-09: qty 90, 90d supply, fill #0
  Filled 2024-01-01: qty 90, 90d supply, fill #1

## 2023-10-10 ENCOUNTER — Encounter (HOSPITAL_COMMUNITY)

## 2023-10-17 ENCOUNTER — Encounter: Payer: Self-pay | Admitting: Sports Medicine

## 2023-10-28 ENCOUNTER — Other Ambulatory Visit: Payer: Self-pay | Admitting: Nurse Practitioner

## 2023-10-28 DIAGNOSIS — E890 Postprocedural hypothyroidism: Secondary | ICD-10-CM

## 2023-10-30 ENCOUNTER — Other Ambulatory Visit (HOSPITAL_BASED_OUTPATIENT_CLINIC_OR_DEPARTMENT_OTHER): Payer: Self-pay

## 2023-10-30 MED ORDER — LEVOTHYROXINE SODIUM 112 MCG PO TABS
112.0000 ug | ORAL_TABLET | Freq: Every day | ORAL | 1 refills | Status: AC
Start: 2023-10-30 — End: ?
  Filled 2023-11-03 – 2023-11-17 (×2): qty 30, 30d supply, fill #0
  Filled 2024-01-05: qty 30, 30d supply, fill #1

## 2023-10-31 ENCOUNTER — Other Ambulatory Visit (HOSPITAL_BASED_OUTPATIENT_CLINIC_OR_DEPARTMENT_OTHER): Payer: Self-pay

## 2023-11-03 ENCOUNTER — Other Ambulatory Visit (HOSPITAL_BASED_OUTPATIENT_CLINIC_OR_DEPARTMENT_OTHER): Payer: Self-pay

## 2023-11-04 ENCOUNTER — Other Ambulatory Visit: Payer: Self-pay

## 2023-11-04 ENCOUNTER — Other Ambulatory Visit (HOSPITAL_BASED_OUTPATIENT_CLINIC_OR_DEPARTMENT_OTHER): Payer: Self-pay

## 2023-11-06 ENCOUNTER — Other Ambulatory Visit: Payer: Self-pay

## 2023-11-16 ENCOUNTER — Other Ambulatory Visit (HOSPITAL_BASED_OUTPATIENT_CLINIC_OR_DEPARTMENT_OTHER): Payer: Self-pay

## 2023-11-17 ENCOUNTER — Other Ambulatory Visit (HOSPITAL_BASED_OUTPATIENT_CLINIC_OR_DEPARTMENT_OTHER): Payer: Self-pay

## 2023-11-21 ENCOUNTER — Other Ambulatory Visit: Payer: Self-pay

## 2023-11-21 ENCOUNTER — Ambulatory Visit (INDEPENDENT_AMBULATORY_CARE_PROVIDER_SITE_OTHER)

## 2023-11-21 ENCOUNTER — Ambulatory Visit: Admitting: Family Medicine

## 2023-11-21 VITALS — BP 164/92 | HR 82 | Ht 64.0 in | Wt 205.0 lb

## 2023-11-21 DIAGNOSIS — G8929 Other chronic pain: Secondary | ICD-10-CM

## 2023-11-21 DIAGNOSIS — M25561 Pain in right knee: Secondary | ICD-10-CM

## 2023-11-21 DIAGNOSIS — Z636 Dependent relative needing care at home: Secondary | ICD-10-CM | POA: Insufficient documentation

## 2023-11-21 NOTE — Patient Instructions (Addendum)
Thank you for coming in today.   Please get an Xray today before you leave   You received an injection today. Seek immediate medical attention if the joint becomes red, extremely painful, or is oozing fluid.   Let me know how you are doing

## 2023-11-21 NOTE — Progress Notes (Signed)
 Katherine Ileana Collet, PhD, LAT, ATC acting as a scribe for Katherine Lloyd, MD.  [name] is a 63 y.o. female who presents to [facility] at [facility] today for exacerbation of her R knee pain. Pt was last seen by Dr. Lloyd on 07/03/23 and was given a R knee steroid injection.  Today, pt reports R knee pain didn't really get back from prior CSI in May. She has been dealing with multiple health issues w/ her husband, so her activity has been drastically increased. 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 She c/o a tightness in the posterior aspect of her R knee.   She notes significant difficulty at home.  Her husband is very ill having multiple medical problems and also suffering from cognitive impairment.  She thinks he is going to have to go to a nursing home soon.  Dx imaging: 05/25/2020 R knee XR  Pertinent review of systems: No fevers or chills  Relevant historical information: Diabetes.  Hypertension.  Significant caregiver fatigue.   Exam:  BP (!) 164/92   Pulse 82   Ht 5' 4 (1.626 m)   Wt 205 lb (93 kg)   SpO2 98%   BMI 35.19 kg/m  General: Well Developed, well nourished, and in no acute distress.   MSK: Right knee minimal effusion normal motion with crepitation.  Mild antalgic gait.  Psych: Tearful affect at times.  No SI or HI expressed.    Lab and Radiology Results  Procedure: Real-time Ultrasound Guided Injection of right knee joint superior lateral patella space Device: Philips Affiniti 50G/GE Logiq Images permanently stored and available for review in PACS Verbal informed consent obtained.  Discussed risks and benefits of procedure. Warned about infection, bleeding, hyperglycemia damage to structures among others. Patient expresses understanding and agreement Time-out conducted.   Noted no overlying erythema, induration, or other signs of local infection.   Skin prepped in a sterile fashion.   Local anesthesia:  Topical Ethyl chloride.   With sterile technique and under real time ultrasound guidance: 40 mg of Kenalog  and 2 mL of Marcaine  injected into knee joint. Fluid seen entering the joint capsule.   Completed without difficulty   Pain immediately resolved suggesting accurate placement of the medication.   Advised to call if fevers/chills, erythema, induration, drainage, or persistent bleeding.   Images permanently stored and available for review in the ultrasound unit.  Impression: Technically successful ultrasound guided injection.   X-ray images right knee obtained today personally and independently interpreted. Mild DJD.  No acute fractures. Await formal radiology review   Assessment and Plan: 63 y.o. female with right knee pain due to exacerbation of DJD.  Plan for steroid injection.  Additionally get an x-ray today to further evaluate source of pain.  Bigger issue today is caregiver burden.  We spent a lot of time talking about this.  It seems like she has a good handle on the situation.  Her husband likely will require skilled nursing facility or memory care unit in the near future.  It sounds like he is going to wind up with a below the knee amputation at some point which will definitely make it hard for her to care for him at home.  Recommend talking to financial planner about finances with a husband and nursing home.  Additionally recommend looking at the nursing homes in the area.  Happy to help with this although she has an excellent PCP.  PDMP not reviewed this encounter. Orders Placed This Encounter  Procedures   US  LIMITED JOINT SPACE STRUCTURES LOW RIGHT(NO LINKED CHARGES)    Reason for Exam (SYMPTOM  OR DIAGNOSIS REQUIRED):   right knee pain    Preferred imaging location?:   [facility] Knee AP/LAT W/[place] Right    Standing Status:   Future    Number of Occurrences:   1    Expiration Date:   12/22/2023    Reason for Exam (SYMPTOM  OR DIAGNOSIS  REQUIRED):   right knee pain    Preferred imaging location?:   [facility]   No orders of the defined types were placed in this encounter.    Discussed warning signs or symptoms. Please see discharge instructions. Patient expresses understanding.   The above documentation has been reviewed and is accurate and complete [name], M.D.

## 2023-11-27 ENCOUNTER — Ambulatory Visit: Payer: Self-pay | Admitting: Family Medicine

## 2023-11-27 ENCOUNTER — Other Ambulatory Visit (HOSPITAL_BASED_OUTPATIENT_CLINIC_OR_DEPARTMENT_OTHER): Payer: Self-pay

## 2023-11-27 ENCOUNTER — Other Ambulatory Visit: Payer: Self-pay | Admitting: Nurse Practitioner

## 2023-11-27 MED ORDER — SPIRIVA RESPIMAT 2.5 MCG/ACT IN AERS
2.0000 | INHALATION_SPRAY | Freq: Every day | RESPIRATORY_TRACT | 5 refills | Status: AC
  Filled 2024-01-05: qty 4, 30d supply, fill #0
  Filled 2024-02-06: qty 4, 30d supply, fill #1

## 2023-11-27 NOTE — Progress Notes (Signed)
Right knee x-ray shows some arthritis.

## 2023-11-27 NOTE — Telephone Encounter (Signed)
 Forwarding to Dr. Denyse Amass to review and advise.

## 2023-11-30 ENCOUNTER — Encounter: Payer: Self-pay | Admitting: Family Medicine

## 2023-11-30 ENCOUNTER — Other Ambulatory Visit (HOSPITAL_BASED_OUTPATIENT_CLINIC_OR_DEPARTMENT_OTHER): Payer: Self-pay

## 2023-11-30 ENCOUNTER — Ambulatory Visit (INDEPENDENT_AMBULATORY_CARE_PROVIDER_SITE_OTHER): Admitting: Family Medicine

## 2023-11-30 VITALS — BP 120/76 | HR 80 | Ht 64.0 in | Wt 204.8 lb

## 2023-11-30 DIAGNOSIS — J0101 Acute recurrent maxillary sinusitis: Secondary | ICD-10-CM | POA: Diagnosis not present

## 2023-11-30 DIAGNOSIS — Z636 Dependent relative needing care at home: Secondary | ICD-10-CM

## 2023-11-30 DIAGNOSIS — F418 Other specified anxiety disorders: Secondary | ICD-10-CM

## 2023-11-30 MED ORDER — AMOXICILLIN-POT CLAVULANATE 875-125 MG PO TABS
1.0000 | ORAL_TABLET | Freq: Two times a day (BID) | ORAL | 0 refills | Status: DC
  Filled 2023-11-30: qty 20, 10d supply, fill #0

## 2023-11-30 NOTE — Progress Notes (Signed)
   Subjective:    Patient ID: [name], female    DOB: Apr 17, 1960, 63 y.o.   MRN: [id number]  Discussed the use of AI scribe software for clinical note transcription with the patient, who gave verbal consent to proceed.  History of Present Illness   [name] is a 63 year old female with recurrent sinus infections who presents with sinus congestion and pain.  She describes her sinus congestion and pain as feeling like 'a brick in my head,' which started over the weekend, around Friday. The symptoms are more pronounced on the left side and include a painful ear and sore throat. No drainage is noted, but there is significant pressure and membrane swelling.  She has a history of recurrent sinus infections, occurring several times a year, and previously had a staph infection in her nose discovered during pre-testing for a triple bypass surgery in February. She has been prescribed Zyrtec  in the past and was referred to an ear, nose, and throat specialist, but did not complete the follow-up. A CT scan was ordered but not completed due to scheduling issues and her heart problems. She has been treated with Levaquin  and Augmentin  in the past, with Augmentin  being more effective. She also experiences soreness in her biceps and triceps during sinus infections.  Her past medical history includes a triple bypass surgery in February of this year and a history of asthma. She mentions having anxiety, which she attributes to the loss of her daughter to cancer five years ago. She takes Xanax , half a day, to manage her anxiety but notes it affects her sleep. She also reports having an asthma attack recently, which affected her sleep.  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 She has a history of being a mother from a young age and has always prioritized her family's needs over her own.           Review of Systems     Objective:    Physical  Exam Physical Exam   Alert and in no distress but tearful due to the situation she is under with her care and with her husband's care.  TMs are normal.  Nasal Koza slightly erythematous.  Tender especially over left maxillary sinus.           Assessment & Plan:  Assessment and Plan    Acute recurrent maxillary sinusitis Recurrent left-sided sinusitis with congestion and pressure. History of staph infection. Previous antibiotics effective. Negative COVID test. - Prescribed Augmentin  for 10 days. - Instructed to contact [name] if symptoms persist beyond 10 days for possible extension of antibiotic treatment.   Coronary artery disease, status post coronary artery bypass grafting Post-triple bypass surgery. No current cardiac symptoms. Stress and caregiver fatigue noted.  Anxiety Chronic anxiety worsened by caregiver stress and personal loss. Currently on Xanax , reduced dose due to sedation. - Encouraged counseling for stress management and coping strategies. - Continue Xanax  as needed.

## 2023-12-07 ENCOUNTER — Other Ambulatory Visit: Payer: Self-pay

## 2023-12-07 ENCOUNTER — Encounter: Payer: Self-pay | Admitting: Nurse Practitioner

## 2023-12-07 ENCOUNTER — Other Ambulatory Visit (HOSPITAL_BASED_OUTPATIENT_CLINIC_OR_DEPARTMENT_OTHER): Payer: Self-pay

## 2023-12-07 MED ORDER — AZITHROMYCIN 250 MG PO TABS
ORAL_TABLET | ORAL | 0 refills | Status: AC
  Filled 2023-12-07: qty 6, 5d supply, fill #0

## 2023-12-09 ENCOUNTER — Other Ambulatory Visit (HOSPITAL_BASED_OUTPATIENT_CLINIC_OR_DEPARTMENT_OTHER): Payer: Self-pay

## 2023-12-20 ENCOUNTER — Encounter: Payer: Self-pay | Admitting: Nurse Practitioner

## 2023-12-21 NOTE — Progress Notes (Signed)
 Mammo: 2024 is due for one this year [facility]  Colonoscopy: had one years ago, due Eye exam: due  Shingles: hold off  Pneumonia: will do today if she is not to sick Covid: today if Camie is ok    [name], DNP, AGNP-c [facility]  [address] [place], [place] [address] [phone]  ESTABLISHED PATIENT- Chronic Health and/or Follow-Up Visit on 12/22/2023  Blood pressure 120/74, pulse 80, weight 207 lb 6.4 oz (94.1 kg).   HPI: History of Present Illness [name] is a 63 year old female with anxiety and asthma who presents with increased anxiety and asthma exacerbation.  She has experienced heightened anxiety over the past week, attributed to personal stressors such as a recent argument with her boss and concerns about her husband's health. She reports that her anxiety worsens when she does not consistently use her asthma medications. She has a history of post-traumatic stress disorder related to the loss of her daughter and other significant losses.  She reports symptoms of asthma exacerbation, associated with irregular use of her asthma medications. She experiences chest heaviness and pressure, which she attributes to both anxiety and asthma. She resumed her asthma medication yesterday after experiencing these symptoms.  She is currently taking venlafaxine  225 mg daily for anxiety and uses Xanax  as needed, often taking half a dose due to sedation. She has been on venlafaxine  for approximately 20 years.  Her home environment is challenging due to her husband's health issues, including high blood sugar levels and mobility problems following a stroke. He has been off Mounjaro , leading to increased snacking and higher blood sugar levels.  She experiences significant stress related to her work environment, including a lack of communication and support from acupuncturist. She feels overwhelmed by her responsibilities both at work and at home, as she is  the primary caregiver for her husband.  She reports sleeping at night but waking up feeling anxious and exhausted. She has a history of difficulty falling asleep since childhood and notes that taking B vitamins helps her wake up in the morning. Despite sleeping, she does not feel rested and describes her sleep as not being of good quality.  She has a history of heart issues, having undergone heart surgery in the past. She occasionally uses nitroglycerin  for chest pain but has not needed it recently. She acknowledges the need for exercise and finds it challenging to incorporate into her routine due to her busy schedule and physical limitations.  All ROS negative with exception of what is listed above.    PHYSICAL EXAM Physical Exam Vitals and nursing note reviewed.  Constitutional:      General: She is not in acute distress.    Appearance: Normal appearance. She is obese.  HENT:     Head: Normocephalic.     Nose: Congestion and rhinorrhea present.     Comments: Frontal sinus tenderness present.     Mouth/Throat:     Mouth: Mucous membranes are moist.     Pharynx: Posterior oropharyngeal erythema present.  Eyes:     Pupils: Pupils are equal, round, and reactive to light.  Cardiovascular:     Rate and Rhythm: Normal rate and regular rhythm.     Pulses: Normal pulses.     Heart sounds: Normal heart sounds.  Pulmonary:     Effort: Pulmonary effort is normal.     Breath sounds: Normal breath sounds.  Abdominal:     General: Bowel sounds are normal.  Palpations: Abdomen is soft.  Musculoskeletal:        General: Normal range of motion.     Cervical back: Normal range of motion.  Skin:    General: Skin is warm.  Neurological:     General: No focal deficit present.     Mental Status: She is alert and oriented to person, place, and time.  Psychiatric:        Mood and Affect: Mood normal.     PLAN Problem List Items Addressed This Visit     Hyperlipidemia associated with  type 2 diabetes mellitus (HCC)   Hyperlipidemia associated with diabetes. Current therapy: rosuvastatin  (Crestor ) 40mg  . Prescribed daily. Last lipids well-controlled. Tolerating statin well. Managing consistent schedule of medication.  - Monitor lipid levels annually, or more frequently if abnormal.  - Continue statin therapy at current dose and frequency - Low fat, low carb diet with daily exercise strongly encouraged.  - F/U DM 39mo       Relevant Orders   Microalbumin/Creatinine Ratio, Urine (Completed)   CMP14+EGFR (Completed)   Hemoglobin A1c (Completed)   Hypertension complicating diabetes (HCC)   Hypertension associated with diabetes. Current therapy: benazepril  10mg , . Prescribed daily. Current BP is controlled. is tolerating medication well. is managing schedule of medication. Lab monitoring is completed today. - Monitor BP at home as directed or if you begin to have symptoms. If your blood pressure readings are consistently higher than 135/85 please let us  know.  - Low fat, low card diet with daily exercise strongly encouraged.  - Continue medication management daily.  - F/U DM 3 mo        Relevant Orders   Microalbumin/Creatinine Ratio, Urine (Completed)   CMP14+EGFR (Completed)   Hemoglobin A1c (Completed)   Acute recurrent frontal sinusitis   Recurrent sinusitis with symptoms of nasal drainage and sore throat. Previous treatment with Z-Pak and Augmentin  was effective. Current symptoms suggest possible recurrence. - Prescribed Z-Pak for sinusitis. - Advised use of Afrin and Zyrtec  for symptomatic relief.      Relevant Medications   azithromycin  (ZITHROMAX ) 250 MG tablet   Situational anxiety   Chronic anxiety and depressive symptoms exacerbated by recent stressors, including work-related issues and caregiving responsibilities. Current medication regimen includes venlafaxine , which may need adjustment due to long-term use. Anxiety symptoms include difficulty sleeping,  feeling overwhelmed, and emotional distress. Consideration of therapy and social engagement as adjunctive treatments. - Prescribed buspirone for anxiety management. - Initiated tapering off venlafaxine  with a plan to switch to an alternative medication. - Encouraged engagement in therapy sessions. - Advised on social engagement activities to improve mood.      Relevant Medications   busPIRone (BUSPAR) 5 MG tablet   Moderate persistent asthma without complication   Recent non-adherence to medication regimen, potentially contributing to increased anxiety and respiratory symptoms. Symptoms include chest tightness and pressure, possibly exacerbated by anxiety.      Diabetes 1.5, managed as type 2 (HCC)   Currently managed with Mounjaro  at 12.5mg  daily. Monitoring BG closely with Dexcom. No alarm symptoms at this time. Historically she has been controlling well. Will evaluate labs today. No changes in medications.       Relevant Orders   Microalbumin/Creatinine Ratio, Urine (Completed)   CMP14+EGFR (Completed)   Hemoglobin A1c (Completed)   Caregiver burden   Chronic anxiety and depressive symptoms exacerbated by recent stressors, including work-related issues and caregiving responsibilities. Current medication regimen includes venlafaxine , which may need adjustment due to long-term use. Anxiety symptoms  include difficulty sleeping, feeling overwhelmed, and emotional distress. Consideration of therapy and social engagement as adjunctive treatments. - Prescribed buspirone for anxiety management. - Initiated tapering off venlafaxine  with a plan to switch to an alternative medication. - Encouraged engagement in therapy sessions. - Advised on social engagement activities to improve mood.      Depression, recurrent   Chronic anxiety and depressive symptoms exacerbated by recent stressors, including work-related issues and caregiving responsibilities. Current medication regimen includes  venlafaxine , which may need adjustment due to long-term use. Anxiety symptoms include difficulty sleeping, feeling overwhelmed, and emotional distress. Consideration of therapy and social engagement as adjunctive treatments. - Prescribed buspirone for anxiety management. - Initiated tapering off venlafaxine  with a plan to switch to an alternative medication. - Encouraged engagement in therapy sessions. - Advised on social engagement activities to improve mood.      Relevant Medications   busPIRone (BUSPAR) 5 MG tablet   Other Visit Diagnoses       Type 2 diabetes mellitus with complications (HCC)    -  Primary   Relevant Orders   Microalbumin/Creatinine Ratio, Urine (Completed)   CMP14+EGFR (Completed)   Hemoglobin A1c (Completed)     Need for vaccination against Streptococcus pneumoniae       Relevant Orders   Pneumococcal conjugate vaccine 20-valent (Prevnar 20) (Completed)           Return in about 6 weeks (around 02/02/2024) for Med Management 45.  SaraBeth Aerika Groll, DNP, AGNP-c Time: 55 minutes, >50% spent counseling, care coordination, chart review, and documentation.  SABRAtime

## 2023-12-22 ENCOUNTER — Encounter: Payer: Self-pay | Admitting: Nurse Practitioner

## 2023-12-22 ENCOUNTER — Other Ambulatory Visit (HOSPITAL_BASED_OUTPATIENT_CLINIC_OR_DEPARTMENT_OTHER): Payer: Self-pay

## 2023-12-22 ENCOUNTER — Ambulatory Visit (INDEPENDENT_AMBULATORY_CARE_PROVIDER_SITE_OTHER): Admitting: Nurse Practitioner

## 2023-12-22 VITALS — BP 120/74 | HR 80 | Wt 207.4 lb

## 2023-12-22 DIAGNOSIS — J0111 Acute recurrent frontal sinusitis: Secondary | ICD-10-CM

## 2023-12-22 DIAGNOSIS — E139 Other specified diabetes mellitus without complications: Secondary | ICD-10-CM | POA: Diagnosis not present

## 2023-12-22 DIAGNOSIS — I10 Essential (primary) hypertension: Secondary | ICD-10-CM

## 2023-12-22 DIAGNOSIS — F339 Major depressive disorder, recurrent, unspecified: Secondary | ICD-10-CM

## 2023-12-22 DIAGNOSIS — E1169 Type 2 diabetes mellitus with other specified complication: Secondary | ICD-10-CM | POA: Diagnosis not present

## 2023-12-22 DIAGNOSIS — E119 Type 2 diabetes mellitus without complications: Secondary | ICD-10-CM | POA: Diagnosis not present

## 2023-12-22 DIAGNOSIS — E118 Type 2 diabetes mellitus with unspecified complications: Secondary | ICD-10-CM

## 2023-12-22 DIAGNOSIS — E785 Hyperlipidemia, unspecified: Secondary | ICD-10-CM

## 2023-12-22 DIAGNOSIS — Z636 Dependent relative needing care at home: Secondary | ICD-10-CM

## 2023-12-22 DIAGNOSIS — Z23 Encounter for immunization: Secondary | ICD-10-CM | POA: Diagnosis not present

## 2023-12-22 DIAGNOSIS — F418 Other specified anxiety disorders: Secondary | ICD-10-CM

## 2023-12-22 DIAGNOSIS — J4540 Moderate persistent asthma, uncomplicated: Secondary | ICD-10-CM

## 2023-12-22 DIAGNOSIS — Z7985 Long-term (current) use of injectable non-insulin antidiabetic drugs: Secondary | ICD-10-CM

## 2023-12-22 MED ORDER — BUSPIRONE HCL 5 MG PO TABS
5.0000 mg | ORAL_TABLET | Freq: Two times a day (BID) | ORAL | 2 refills | Status: DC
  Filled 2023-12-22: qty 60, 30d supply, fill #0
  Filled 2024-01-08 – 2024-01-26 (×3): qty 60, 30d supply, fill #1

## 2023-12-22 NOTE — Patient Instructions (Addendum)
 I want to taper down on the Venlafaxine , but I need to do some research to see what would be the best to switch to. For now, stay where you are and I will let you know what we can start. I will look this over this weekend and let you know on Monday.   Call and set up the therapy through work. We can always get you set up with another therapist if these sessions are limited.   I sent in the buspirone to try. You can take this twice a day to help keep you calm and manage the anxiety better.   Managing Depression, Adult Depression is a mental health condition that affects your thoughts, feelings, and actions. Being diagnosed with depression can bring you relief if you did not know why you have felt or behaved a certain way. It could also leave you feeling overwhelmed. Finding ways to manage your symptoms can help you feel more positive about your future. How to manage lifestyle changes Being depressed is difficult. Depression can increase the level of everyday stress. Stress can make depression symptoms worse. You may believe your symptoms cannot be managed or will never improve. However, there are many things you can try to help manage your symptoms. There is hope. Managing stress  Stress is your body's reaction to life changes and events, both good and bad. Stress can add to your feelings of depression. Learning to manage your stress can help lessen your feelings of depression. Try some of the following approaches to reducing your stress (stress reduction techniques): Listen to music that you enjoy and that inspires you. Try using a meditation app or take a meditation class. Develop a practice that helps you connect with your spiritual self. Walk in nature, pray, or go to a place of worship. Practice deep breathing. To do this, inhale slowly through your nose. Pause at the top of your inhale for a few seconds and then exhale slowly, letting yourself relax. Repeat this three or four times. Practice  yoga to help relax and work your muscles. Choose a stress reduction technique that works for you. These techniques take time and practice to develop. Set aside 5-15 minutes a day to do them. Therapists can offer training in these techniques. Do these things to help manage stress: Keep a journal. Know your limits. Set healthy boundaries for yourself and others, such as saying no when you think something is too much. Pay attention to how you react to certain situations. You may not be able to control everything, but you can change your reaction. Add humor to your life by watching funny movies or shows. Make time for activities that you enjoy and that relax you. Spend less time using electronics, especially at night before bed. The light from screens can make your brain think it is time to get up rather than go to bed.  Medicines Medicines, such as antidepressants, are often a part of treatment for depression. Talk with your pharmacist or health care provider about all the medicines, supplements, and herbal products that you take, their possible side effects, and what medicines and other products are safe to take together. Make sure to report any side effects you may have to your health care provider. Relationships Your health care provider may suggest family therapy, couples therapy, or individual therapy as part of your treatment. How to recognize changes Everyone responds differently to treatment for depression. As you recover from depression, you may start to: Have more interest in  doing activities. Feel more hopeful. Have more energy. Eat a more regular amount of food. Have better mental focus. It is important to recognize if your depression is not getting better or is getting worse. The symptoms you had in the beginning may return, such as: Feeling tired. Eating too much or too little. Sleeping too much or too little. Feeling restless, agitated, or hopeless. Trouble focusing or making  decisions. Having unexplained aches and pains. Feeling irritable, angry, or aggressive. If you or your family members notice these symptoms coming back, let your health care provider know right away. Follow these instructions at home: Activity Try to get some form of exercise each day, such as walking. Try yoga, mindfulness, or other stress reduction techniques. Participate in group activities if you are able. Lifestyle Get enough sleep. Cut down on or stop using caffeine, tobacco, alcohol, and any other harmful substances. Eat a healthy diet that includes plenty of vegetables, fruits, whole grains, low-fat dairy products, and lean protein. Limit foods that are high in solid fats, added sugar, or salt (sodium). General instructions Take over-the-counter and prescription medicines only as told by your health care provider. Keep all follow-up visits. It is important for your health care provider to check on your mood, behavior, and medicines. Your health care provider may need to make changes to your treatment. Where to find support Talking to others  Friends and family members can be sources of support and guidance. Talk to trusted friends or family members about your condition. Explain your symptoms and let them know that you are working with a health care provider to treat your depression. Tell friends and family how they can help. Finances Find mental health providers that fit with your financial situation. Talk with your health care provider if you are worried about access to food, housing, or medicine. Call your insurance company to learn about your co-pays and prescription plan. Where to find more information You can find support in your area from: Anxiety and Depression Association of America (ADAA): adaa.org Mental Health [place]: mentalhealthamerica.net [employer] on Mental Illness: nami.org Contact a health care provider if: You stop taking your antidepressant medicines,  and you have any of these symptoms: Nausea. Headache. Light-headedness. Chills and body aches. Not being able to sleep (insomnia). You or your friends and family think your depression is getting worse. Get help right away if: You have thoughts of hurting yourself or others. Get help right away if you feel like you may hurt yourself or others, or have thoughts about taking your own life. Go to your nearest emergency room or: Call 911. Call the National Suicide Prevention Lifeline at (906)351-7464 or 988. This is open 24 hours a day. Text the Crisis Text Line at 279-547-0906. This information is not intended to replace advice given to you by your health care provider. Make sure you discuss any questions you have with your health care provider. Document Revised: 06/08/2021 Document Reviewed: 06/08/2021 Elsevier Patient Education  2024 [employer].

## 2023-12-23 ENCOUNTER — Other Ambulatory Visit (HOSPITAL_BASED_OUTPATIENT_CLINIC_OR_DEPARTMENT_OTHER): Payer: Self-pay

## 2023-12-23 LAB — CMP14+EGFR
ALT: 38 IU/L — ABNORMAL HIGH (ref 0–32)
AST: 34 IU/L (ref 0–40)
Albumin: 4.5 g/dL (ref 3.9–4.9)
Alkaline Phosphatase: 138 IU/L — ABNORMAL HIGH (ref 49–135)
BUN/Creatinine Ratio: 16 (ref 12–28)
BUN: 14 mg/dL (ref 8–27)
Bilirubin Total: 0.4 mg/dL (ref 0.0–1.2)
CO2: 21 mmol/L (ref 20–29)
Calcium: 9.4 mg/dL (ref 8.7–10.3)
Chloride: 103 mmol/L (ref 96–106)
Creatinine, Ser: 0.9 mg/dL (ref 0.57–1.00)
Globulin, Total: 2.6 g/dL (ref 1.5–4.5)
Glucose: 87 mg/dL (ref 70–99)
Potassium: 4.1 mmol/L (ref 3.5–5.2)
Sodium: 139 mmol/L (ref 134–144)
Total Protein: 7.1 g/dL (ref 6.0–8.5)
eGFR: 72 mL/min/1.73 (ref >59)

## 2023-12-23 LAB — MICROALBUMIN / CREATININE URINE RATIO
Creatinine, Urine: 101 mg/dL
Microalb/Creat Ratio: 9 mg/g{creat} (ref 0–29)
Microalbumin, Urine: 9.1 ug/mL

## 2023-12-23 LAB — HEMOGLOBIN A1C
Est. average glucose Bld gHb Est-mCnc: 151 mg/dL
Hgb A1c MFr Bld: 6.9 % — ABNORMAL HIGH (ref 4.8–5.6)

## 2023-12-23 MED ORDER — AZITHROMYCIN 250 MG PO TABS
ORAL_TABLET | ORAL | 0 refills | Status: AC
  Filled 2023-12-23: qty 6, 5d supply, fill #0

## 2023-12-27 ENCOUNTER — Ambulatory Visit: Payer: Self-pay | Admitting: Nurse Practitioner

## 2023-12-29 ENCOUNTER — Other Ambulatory Visit (HOSPITAL_BASED_OUTPATIENT_CLINIC_OR_DEPARTMENT_OTHER): Payer: Self-pay

## 2023-12-31 ENCOUNTER — Emergency Department (HOSPITAL_BASED_OUTPATIENT_CLINIC_OR_DEPARTMENT_OTHER)

## 2023-12-31 ENCOUNTER — Encounter (HOSPITAL_BASED_OUTPATIENT_CLINIC_OR_DEPARTMENT_OTHER): Payer: Self-pay

## 2023-12-31 ENCOUNTER — Emergency Department (HOSPITAL_BASED_OUTPATIENT_CLINIC_OR_DEPARTMENT_OTHER)
Admission: EM | Admit: 2023-12-31 | Discharge: 2024-01-01 | Disposition: A | Discharge status: Home / Self Care | Attending: Emergency Medicine | Admitting: Emergency Medicine

## 2023-12-31 DIAGNOSIS — Z7982 Long term (current) use of aspirin: Secondary | ICD-10-CM | POA: Insufficient documentation

## 2023-12-31 DIAGNOSIS — Z951 Presence of aortocoronary bypass graft: Secondary | ICD-10-CM | POA: Insufficient documentation

## 2023-12-31 DIAGNOSIS — R0789 Other chest pain: Secondary | ICD-10-CM

## 2023-12-31 DIAGNOSIS — E119 Type 2 diabetes mellitus without complications: Secondary | ICD-10-CM | POA: Insufficient documentation

## 2023-12-31 DIAGNOSIS — E039 Hypothyroidism, unspecified: Secondary | ICD-10-CM | POA: Diagnosis not present

## 2023-12-31 DIAGNOSIS — I10 Essential (primary) hypertension: Secondary | ICD-10-CM | POA: Diagnosis not present

## 2023-12-31 DIAGNOSIS — R072 Precordial pain: Secondary | ICD-10-CM | POA: Diagnosis not present

## 2023-12-31 DIAGNOSIS — Z79899 Other long term (current) drug therapy: Secondary | ICD-10-CM | POA: Diagnosis not present

## 2023-12-31 DIAGNOSIS — R000 Tachycardia, unspecified: Secondary | ICD-10-CM | POA: Diagnosis not present

## 2023-12-31 DIAGNOSIS — R079 Chest pain, unspecified: Secondary | ICD-10-CM | POA: Diagnosis not present

## 2023-12-31 LAB — CBC
HCT: 41.8 % (ref 36.0–46.0)
Hemoglobin: 14.5 g/dL (ref 12.0–15.0)
MCH: 29.5 pg (ref 26.0–34.0)
MCHC: 34.7 g/dL (ref 30.0–36.0)
MCV: 85 fL (ref 80.0–100.0)
Platelets: 185 K/uL (ref 150–400)
RBC: 4.92 MIL/uL (ref 3.87–5.11)
RDW: 12.7 % (ref 11.5–15.5)
WBC: 9.4 K/uL (ref 4.0–10.5)
nRBC: 0 % (ref 0.0–0.2)

## 2023-12-31 LAB — BASIC METABOLIC PANEL WITH GFR
Anion gap: 14 (ref 5–15)
BUN: 13 mg/dL (ref 8–23)
CO2: 21 mmol/L — ABNORMAL LOW (ref 22–32)
Calcium: 10 mg/dL (ref 8.9–10.3)
Chloride: 105 mmol/L (ref 98–111)
Creatinine, Ser: 0.81 mg/dL (ref 0.44–1.00)
GFR, Estimated: >60 mL/min (ref >60)
Glucose, Bld: 144 mg/dL — ABNORMAL HIGH (ref 70–99)
Potassium: 3.7 mmol/L (ref 3.5–5.1)
Sodium: 139 mmol/L (ref 135–145)

## 2023-12-31 LAB — TROPONIN T, HIGH SENSITIVITY
Troponin T High Sensitivity: <15 ng/L (ref 0–19)
Troponin T High Sensitivity: <15 ng/L (ref 0–19)

## 2023-12-31 MED ORDER — ASPIRIN 81 MG PO CHEW
324.0000 mg | CHEWABLE_TABLET | Freq: Once | ORAL | Status: DC
  Filled 2023-12-31: qty 4

## 2023-12-31 MED ORDER — HYDROXYZINE HCL 25 MG PO TABS
25.0000 mg | ORAL_TABLET | Freq: Once | ORAL | Status: AC
  Administered 2023-12-31: 25 mg via ORAL
  Filled 2023-12-31: qty 1

## 2023-12-31 MED ORDER — METOPROLOL TARTRATE 25 MG PO TABS
25.0000 mg | ORAL_TABLET | Freq: Once | ORAL | Status: DC
  Filled 2023-12-31: qty 1

## 2023-12-31 MED ORDER — HYDROXYZINE HCL 25 MG PO TABS
25.0000 mg | ORAL_TABLET | Freq: Three times a day (TID) | ORAL | 0 refills | Status: DC | PRN
  Filled 2023-12-31: qty 12, 4d supply, fill #0

## 2023-12-31 NOTE — ED Triage Notes (Signed)
 PT to triage c/o chest pain with tachycardia x 8 hours. 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 EKG completed, IV established and labs drawn . PT VSS PT on room air. PT denies SOB CP at this time

## 2023-12-31 NOTE — ED Provider Notes (Incomplete)
 [place] EMERGENCY DEPARTMENT AT [facility] Provider Note   CSN: [id number] Arrival date & time: 12/31/23  2051     History Chief Complaint  Patient presents with   Chest Pain    HPI: [name] is a 63 y.o. female with history perinent for prior CABG, hypothyroidism, HLD, HTN, T2DM who presents complaining of chest pain. Patient arrived via POV.  History provided by patient.  No interpreter required during this encounter.  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  Reports that she does have stable angina at baseline which typically occurs when she is exerting herself such as doing yard work, however today she had a severe episode of substernal chest pressure while at rest that was different than her usual episodes, however she became concerned that it could be a heart attack, therefore she decided come to the emergency department.  Patient reports that she was watching her grandson at the time and had to call her son who was reportedly unkind on the phone, and she is less feeling rather anxious and is tearful.  Patient denies ongoing chest pain, denies fever, chills, shortness of breath, nausea, vomiting, diarrhea, abdominal pain, dysuria.  Patient's recorded medical, surgical, social, medication list and allergies were reviewed in the Snapshot window as part of the initial history.   Prior to Admission medications   Medication Sig Start Date End Date Taking? Authorizing Provider  hydrOXYzine  (ATARAX ) 25 MG tablet Take 1 tablet (25  mg total) by mouth every 8 (eight) hours as needed for anxiety. 12/31/23  Yes Rogelia Jerilynn RAMAN, MD  Albuterol -Budesonide  (AIRSUPRA ) 90-80 MCG/ACT AERO Inhale 1-2 Inhalations into the lungs every 6 (six) hours as needed for wheezing, cough, shortness of breath. 03/27/23   Early, Sara E, NP  ALPRAZolam  (XANAX ) 0.5 MG tablet Take 0.5-1 tablets (0.25-0.5 mg total) by mouth up to 2 (two) times daily as needed for anxiety. Not for daily use. 09/06/23   Early, Sara E, NP  aspirin  EC 325 MG tablet Take 1 tablet (325 mg total) by mouth daily. 04/10/23   Roddenberry, Myron G, PA-C  azithromycin  (ZITHROMAX ) 250 MG tablet Take 2 tablets on day 1, then 1 tablet daily on days 2 through 5 12/23/23 01/03/24  Early, Sara E, NP  B Complex Vitamins (VITAMIN B COMPLEX PO) Take 1 tablet by mouth daily.    [provider]  benazepril  (LOTENSIN ) 10 MG tablet Take 1 tablet (10 mg total) by mouth daily. 06/26/23   Early, Sara E, NP  busPIRone (BUSPAR) 5 MG tablet Take 1 tablet (5 mg total) by mouth 2 (two) times daily. 12/22/23   Early, Sara E, NP  celecoxib  (CELEBREX ) 200 MG capsule Take 1 capsule (200 mg total) by mouth daily. 10/09/23   Early, Sara E, NP  cetirizine  (ZYRTEC ) 10 MG tablet Take 1 tablet (10 mg total) by mouth daily. 02/16/23   Soldatova, Liuba, MD  Continuous Glucose Receiver (DEXCOM G7 RECEIVER) DEVI For use with Dexcom G7 sensor 06/14/22   Early, Sara E, NP  Continuous  Glucose Sensor (DEXCOM G7 SENSOR) MISC Apply new sensor every 10 days for continuous glucose monitoring. 05/25/23   Early, Sara E, NP  fluticasone  (FLONASE ) 50 MCG/ACT nasal spray Place 2 sprays into both nostrils 2 (two) times daily. 02/16/23   Soldatova, Liuba, MD  fluticasone  furoate-vilanterol (BREO ELLIPTA ) 100-25 MCG/ACT AEPB Inhale 1 puff into the lungs daily. 03/27/23   Early, Sara E, NP  guaiFENesin  (MUCINEX ) 600 MG 12 hr tablet Take 1 tablet (600 mg total) by mouth 2 (two) times daily. 04/09/23   Roddenberry, Myron G, PA-C   levothyroxine  (SYNTHROID ) 112 MCG tablet Take 1 tablet (112 mcg total) by mouth daily. 10/30/23   Early, Sara E, NP  Menthol, Topical Analgesic, (ICY HOT EX) Apply 1 application topically daily as needed (pain).    [provider]  metoprolol  succinate (TOPROL -XL) 100 MG 24 hr tablet Take 1 tablet (100 mg total) by mouth daily. Take with or immediately following a meal. 05/25/23   Early, Camie BRAVO, NP  nitroGLYCERIN  (NITROSTAT ) 0.4 MG SL tablet Place 1 tablet (0.4 mg total) under the tongue every 5 (five) minutes as needed for chest pain. Max three doses in a row. Proceed to ED for evaluation if no improvement. 02/13/23   Early, Sara E, NP  pantoprazole  (PROTONIX ) 40 MG tablet Take 1 tablet (40 mg total) by mouth daily. 05/25/23   Early, Sara E, NP  Probiotic Product (PROBIOTIC PO) Take 1 capsule by mouth in the morning.    [provider]  rosuvastatin  (CRESTOR ) 40 MG tablet Take 1 tablet (40 mg total) by mouth daily. 05/25/23   Early, Sara E, NP  Tiotropium Bromide  (SPIRIVA  RESPIMAT) 2.5 MCG/ACT AERS Inhale 2 puffs into the lungs daily. 11/27/23   Early, Sara E, NP  tirzepatide  (MOUNJARO ) 12.5 MG/0.5ML Pen Inject 12.5 mg into the skin once a week. 06/15/23   Early, Sara E, NP  venlafaxine  XR (EFFEXOR -XR) 75 MG 24 hr capsule Take 3 capsules (225 mg total) by mouth daily with breakfast. 01/31/23   Early, Camie BRAVO, NP  Vitamin D , Ergocalciferol , (DRISDOL ) 1.25 MG (50000 UNIT) CAPS capsule Take 1 capsule (50,000 Units total) by mouth every 7 (seven) days. 10/02/23   Early, Sara E, NP     Allergies: Influenza virus vacc split pf, Gluten meal, and Lipitor [atorvastatin ]   Review of Systems   ROS as per HPI  Physical Exam Updated Vital Signs BP 138/62   Pulse 72   Temp 98.3 F (36.8 C)   Resp 19   Ht 5' 4 (1.626 m)   Wt 94.1 kg   SpO2 95%   BMI 35.61 kg/m  Physical Exam Vitals and nursing note reviewed.  Constitutional:      General: She is not in acute distress.    Appearance:  She is well-developed.  HENT:     Head: Normocephalic and atraumatic.  Eyes:     Conjunctiva/sclera: Conjunctivae normal.  Cardiovascular:     Rate and Rhythm: Normal rate and regular rhythm.     Pulses:          Radial pulses are 2+ on the right side and 2+ on the left side.     Heart sounds: No murmur heard. Pulmonary:     Effort: Pulmonary effort is normal. No respiratory distress.     Breath sounds: Normal breath sounds.  Abdominal:     Palpations: Abdomen is soft.     Tenderness: There is no abdominal tenderness.  Musculoskeletal:  General: No swelling.     Cervical back: Neck supple.  Skin:    General: Skin is warm and dry.     Capillary Refill: Capillary refill takes less than 2 seconds.  Neurological:     Mental Status: She is alert.  Psychiatric:        Mood and Affect: Mood is anxious. Affect is tearful.     ED Course/ Medical Decision Making/ A&P    Procedures Procedures   Medications Ordered in ED Medications  hydrOXYzine  (ATARAX ) tablet 25 mg (25 mg Oral Given 12/31/23 2241)    Medical Decision Making:   Aidaly Cordner is a 63 y.o. female who presents for chest pain as per above.  Physical exam is pertinent for anxious mood, tearful affect  The differential includes but is not limited to ACS, arrhythmia, pericardial tamponade, pericarditis, myocarditis, pneumonia, pneumothorax, esophageal, tear, perforated abdominal viscous, pulmonary embolism, aortic dissection, costochondritis, musculoskeletal chest wall pain, GERD.  Independent historian: None  External data reviewed: No pertinent external data  Labs: Ordered, Independent interpretation, and Details: CBC without leukocytosis, anemia, thrombocytopenia.  BMP without AKI, emergent electrolyte derangement.  Troponin undetectable on initial and delta.  Radiology: Ordered, Independent interpretation, Details: Chest x-ray without focal airspace opacification, cardiomediastinal silhouette gentian,  pneumothorax, pleural effusion, bony derangement, and All images reviewed independently.  Agree with radiology report at this time.   DG Chest Port 1 View Result Date: 12/31/2023 CLINICAL DATA:  Chest pain and tachycardia. EXAM: PORTABLE CHEST 1 VIEW COMPARISON:  May 11, 2023 FINDINGS: Multiple sternal wires and vascular clips are noted. The heart size and mediastinal contours are within normal limits. No acute infiltrate, pleural effusion or pneumothorax is identified. The visualized skeletal structures are unremarkable. IMPRESSION: 1. Evidence of prior median sternotomy/CABG. 2. No active cardiopulmonary disease. Electronically Signed   By: Suzen Dials M.D.   On: 12/31/2023 21:48    EKG/Medicine tests: Ordered and Independent interpretation EKG Interpretation Date/Time:  Sunday December 31 2023 20:59:06 EST Ventricular Rate:  84 PR Interval:  154 QRS Duration:  84 QT Interval:  352 QTC Calculation: 415 R Axis:   38  Text Interpretation: Normal sinus rhythm Possible Anterior infarct , age undetermined Abnormal ECG When compared with ECG of 06-Apr-2023 06:52, Criteria for Inferior infarct are no longer Present Confirmed by Rogelia Satterfield ([id number]) on 12/31/2023 11:40:12 PM  Interventions: Atarax   See the EMR for full details regarding lab and imaging results.   The ECG reveals no anatomical ischemia representing STEMI, New-Onset Arrhythmia, or ischemic equivalent.  She has been risk stratified with a HEAR score of 4. Initial troponin is undetectable; delta troponin is undetectable.  The patient's presentation, the patient being hemodynamically stable, and the ECG are not consistent with Pericardial Tamponade. The patient's pain is not positional. This in conjunction with the lack of PR depressions and ST elevations on the ECG are reassuring against Pericarditis. The patient's non-elevated troponin and ECG are also inconsistent with Myocarditis.  The CXR is unremarkable for focal  airspace disease.  The patient is afebrile and denies productive cough.  Therefore, I do not suspect Pneumonia. There is no evidence of Pneumothorax on physical exam or on the CXR. CXR shows no evidence of Esophageal Tear and there is no recent intractable emesis or esophageal instrumentation. There is no peritonitis or free air on CXR worrisome for a Perforated Abdominal Viscous.  The patient's pain is not tearing and it does not radiate to back. Pulses are present bilaterally in both the upper  and lower extremities. CXR does not show a widened mediastinum. I have a very low suspicion for Aortic Dissection.  Overall, patient's presentation is reassuring against emergent pathology of chest pain.  Patient does have stable angina at baseline with activity such as yard work, however this episode of chest pain was atypical, and occurred at rest, patient with significant stressors as a caregiver, feel that anxiety is contributing to patient's presentation.  Patient feeling much improved after Atarax  in the ED.  Given stable angina, history of CABG, do feel that patient requires close follow-up with cardiology, referral placed back to usual outpatient cardiologist.  [place] following up with PCP for continued titration of her antianxiety medications, will prescribe short course of Atarax  for breakthrough anxiety, patient comfortable with this plan, discharged in stable condition.  Presentation is most consistent with acute complicated illness, Current presentation is complicated by underlying chronic conditions, and I did consider and rule out acute life/limb-threatening illness  Discussion of management or test interpretations with external provider(s): Not indicated  Risk Drugs:Prescription drug management  Disposition: DISCHARGE: I believe that the patient is safe for discharge home with outpatient follow-up. Patient was informed of all pertinent physical exam, laboratory, and imaging findings. Patient's  suspected etiology of their symptom presentation was discussed with the patient and all questions were answered. We discussed following up with PCP and cardiology. I provided thorough ED return precautions. The patient feels safe and comfortable with this plan.  MDM generated using voice dictation software and may contain dictation errors.  Please contact me for any clarification or with any questions.  Clinical Impression:  1. Atypical chest pain      Discharge   Final Clinical Impression(s) / ED Diagnoses Final diagnoses:  Atypical chest pain    Rx / DC Orders ED Discharge Orders          Ordered    hydrOXYzine  (ATARAX ) 25 MG tablet  Every 8 hours PRN        12/31/23 2358    Ambulatory referral to Cardiology       Comments: If you have not heard from the Cardiology office within the next 72 hours please call [phone].   12/31/23 2359             Rogelia Jerilynn RAMAN, MD 01/01/24 0008    Rogelia Jerilynn RAMAN, MD 01/01/24 [phone]

## 2024-01-01 ENCOUNTER — Other Ambulatory Visit (HOSPITAL_BASED_OUTPATIENT_CLINIC_OR_DEPARTMENT_OTHER): Payer: Self-pay

## 2024-01-01 DIAGNOSIS — F339 Major depressive disorder, recurrent, unspecified: Secondary | ICD-10-CM | POA: Insufficient documentation

## 2024-01-01 NOTE — ED Provider Notes (Incomplete)
 [place] EMERGENCY DEPARTMENT AT [facility] Provider Note   CSN: [id number] Arrival date & time: 12/31/23  2051     History Chief Complaint  Patient presents with  . Chest Pain     Chest Pain  HPI: [name] is a 63 y.o. female with *** perinent *** who presents complaining of ***. Patient arrived via {LSARRIVAL:29392} *** History provided by {LSHISTORY:33428}.  {LSINTERPRETER:33419}  ***  Patient's recorded medical, surgical, social, medication list and allergies were reviewed in the Snapshot window as part of the initial history.   Prior to Admission medications   Medication Sig Start Date End Date Taking? Authorizing Provider  hydrOXYzine  (ATARAX ) 25 MG tablet Take 1 tablet (25 mg total) by mouth every 8 (eight) hours as needed for anxiety. 12/31/23  Yes Rogelia Jerilynn RAMAN, MD  Albuterol -Budesonide  (AIRSUPRA ) 90-80 MCG/ACT AERO Inhale 1-2 Inhalations into the lungs every 6 (six) hours as needed for wheezing, cough, shortness of breath. 03/27/23   Early, Sara E, NP  ALPRAZolam  (XANAX ) 0.5 MG tablet Take 0.5-1 tablets (0.25-0.5 mg total) by mouth up to 2 (two) times daily as needed for anxiety. Not for daily use. 09/06/23   Early, Camie BRAVO, NP  aspirin  EC 325 MG tablet Take 1 tablet (325 mg total) by mouth daily. 04/10/23   Roddenberry, Myron G, PA-C  azithromycin  (ZITHROMAX ) 250 MG tablet Take 2 tablets on day 1, then 1 tablet daily on days 2 through 5 12/23/23 01/03/24  Early, Sara E, NP  B Complex Vitamins (VITAMIN B COMPLEX PO) Take 1 tablet by mouth daily.    [provider]  benazepril  (LOTENSIN ) 10 MG tablet Take 1 tablet (10 mg total) by mouth daily. 06/26/23   Early, Sara E, NP  busPIRone (BUSPAR) 5 MG tablet Take 1 tablet (5 mg total) by mouth 2 (two) times daily. 12/22/23   Early, Sara E, NP  celecoxib  (CELEBREX ) 200 MG capsule Take 1 capsule (200 mg total) by mouth daily. 10/09/23   Early, Sara E, NP  cetirizine  (ZYRTEC ) 10 MG tablet Take 1 tablet (10 mg  total) by mouth daily. 02/16/23   Soldatova, Liuba, MD  Continuous Glucose Receiver (DEXCOM G7 RECEIVER) DEVI For use with Dexcom G7 sensor 06/14/22   Early, Sara E, NP  Continuous Glucose Sensor (DEXCOM G7 SENSOR) MISC Apply new sensor every 10 days for continuous glucose monitoring. 05/25/23   Early, Sara E, NP  fluticasone  (FLONASE ) 50 MCG/ACT nasal spray Place 2 sprays into both nostrils 2 (two) times daily. 02/16/23   Soldatova, Liuba, MD  fluticasone  furoate-vilanterol (BREO ELLIPTA ) 100-25 MCG/ACT AEPB Inhale 1 puff into the lungs daily. 03/27/23   Early, Sara E, NP  guaiFENesin  (MUCINEX ) 600 MG 12 hr tablet Take 1 tablet (600 mg total) by mouth 2 (two) times daily. 04/09/23   Roddenberry, Myron G, PA-C  levothyroxine  (SYNTHROID ) 112 MCG tablet Take 1 tablet (112 mcg total) by mouth daily. 10/30/23   Early, Sara E, NP  Menthol, Topical Analgesic, (ICY HOT EX) Apply 1 application topically daily as needed (pain).    [provider]  metoprolol  succinate (TOPROL -XL) 100 MG 24 hr tablet Take 1 tablet (100 mg total) by mouth daily. Take with or immediately following a meal. 05/25/23   Early, Camie BRAVO, NP  nitroGLYCERIN  (NITROSTAT ) 0.4 MG SL tablet Place 1 tablet (0.4 mg total) under the tongue every 5 (five) minutes as needed for chest pain. Max three doses in a row. Proceed to ED for evaluation if no improvement. 02/13/23  Early, Sara E, NP  pantoprazole  (PROTONIX ) 40 MG tablet Take 1 tablet (40 mg total) by mouth daily. 05/25/23   Early, Sara E, NP  Probiotic Product (PROBIOTIC PO) Take 1 capsule by mouth in the morning.    [provider]  rosuvastatin  (CRESTOR ) 40 MG tablet Take 1 tablet (40 mg total) by mouth daily. 05/25/23   Early, Sara E, NP  Tiotropium Bromide  (SPIRIVA  RESPIMAT) 2.5 MCG/ACT AERS Inhale 2 puffs into the lungs daily. 11/27/23   Early, Sara E, NP  tirzepatide  (MOUNJARO ) 12.5 MG/0.5ML Pen Inject 12.5 mg into the skin once a week. 06/15/23   Early, Sara E, NP  venlafaxine  XR  (EFFEXOR -XR) 75 MG 24 hr capsule Take 3 capsules (225 mg total) by mouth daily with breakfast. 01/31/23   Early, Camie BRAVO, NP  Vitamin D , Ergocalciferol , (DRISDOL ) 1.25 MG (50000 UNIT) CAPS capsule Take 1 capsule (50,000 Units total) by mouth every 7 (seven) days. 10/02/23   Early, Sara E, NP     Allergies: Influenza virus vacc split pf, Gluten meal, and Lipitor [atorvastatin ]   Review of Systems   ROS as per HPI  Physical Exam Updated Vital Signs BP 138/62   Pulse 72   Temp 98.3 F (36.8 C)   Resp 19   Ht 5' 4 (1.626 m)   Wt 94.1 kg   SpO2 95%   BMI 35.61 kg/m  Physical Exam Vitals and nursing note reviewed.  Constitutional:      General: She is not in acute distress.    Appearance: She is well-developed.  HENT:     Head: Normocephalic and atraumatic.  Eyes:     Conjunctiva/sclera: Conjunctivae normal.  Cardiovascular:     Rate and Rhythm: Normal rate and regular rhythm.     Pulses:          Radial pulses are 2+ on the right side and 2+ on the left side.     Heart sounds: No murmur heard. Pulmonary:     Effort: Pulmonary effort is normal. No respiratory distress.     Breath sounds: Normal breath sounds.  Abdominal:     Palpations: Abdomen is soft.     Tenderness: There is no abdominal tenderness.  Musculoskeletal:        General: No swelling.     Cervical back: Neck supple.  Skin:    General: Skin is warm and dry.     Capillary Refill: Capillary refill takes less than 2 seconds.  Neurological:     Mental Status: She is alert.  Psychiatric:        Mood and Affect: Mood is anxious. Affect is tearful.     ED Course/ Medical Decision Making/ A&P    Procedures Procedures   Medications Ordered in ED Medications  hydrOXYzine  (ATARAX ) tablet 25 mg (25 mg Oral Given 12/31/23 2241)    Medical Decision Making:   [name] is a 63 y.o. female who presents for chest pain as per above.  Physical exam is pertinent for anxious mood, tearful affect  The  differential includes but is not limited to ACS, arrhythmia, pericardial tamponade, pericarditis, myocarditis, pneumonia, pneumothorax, esophageal, tear, perforated abdominal viscous, pulmonary embolism, aortic dissection, costochondritis, musculoskeletal chest wall pain, GERD.  Independent historian: None  External data reviewed: No pertinent external data  Labs: {LSLABS:33416}  Radiology: Ordered, Independent interpretation, Details: Chest x-ray without focal airspace opacification, cardiomediastinal silhouette gentian, pneumothorax, pleural effusion, bony derangement, and All images reviewed independently. ***Agree with radiology report at this time.  DG Chest Port 1 View Result Date: 12/31/2023 CLINICAL DATA:  Chest pain and tachycardia. EXAM: PORTABLE CHEST 1 VIEW COMPARISON:  May 11, 2023 FINDINGS: Multiple sternal wires and vascular clips are noted. The heart size and mediastinal contours are within normal limits. No acute infiltrate, pleural effusion or pneumothorax is identified. The visualized skeletal structures are unremarkable. IMPRESSION: 1. Evidence of prior median sternotomy/CABG. 2. No active cardiopulmonary disease. Electronically Signed   By: Suzen Dials M.D.   On: 12/31/2023 21:48    EKG/Medicine tests: Ordered and Independent interpretation EKG Interpretation Date/Time:  "Sunday December 31 2023 20:59:06 EST Ventricular Rate:  84 PR Interval:  154 QRS Duration:  84 QT Interval:  352 QTC Calculation: 415 R Axis:   38  Text Interpretation: Normal sinus rhythm Possible Anterior infarct , age undetermined Abnormal ECG When compared with ECG of 06-Apr-2023 06:52, Criteria for Inferior infarct are no longer Present Confirmed by Deepa Barthel, [place] ([id number]) on 12/31/2023 11:40:12 PM  Interventions: Atarax  See the EMR for full details regarding lab and imaging results.   The ECG reveals no anatomical ischemia representing STEMI, New-Onset Arrhythmia, or ischemic  equivalent.  She has been risk stratified with a HEAR score of 4. Initial troponin is undetectable; delta troponin is undetectable.  The patient's presentation, the patient being hemodynamically stable, and the ECG are not consistent with Pericardial Tamponade. The patient's pain is not positional. This in conjunction with the lack of PR depressions and ST elevations on the ECG are reassuring against Pericarditis. The patient's ***non-elevated troponin and ECG are also inconsistent with Myocarditis.  The CXR is un***remarkable for focal airspace disease.  The patient is ***afebrile and ***denies productive cough.  Therefore, I do not suspect Pneumonia. There is ***no evidence of Pneumothorax on physical exam or on the CXR. CXR shows ***no evidence of Esophageal Tear and there is no recent intractable emesis or esophageal instrumentation. There is no ***peritonitis or ***free air on CXR worrisome for a Perforated Abdominal Viscous.  I do not think that the patient has a Pulmonary Embolism. The patient is ***PERC negative (age < 50, HR < 100, SpO2% > 95%, no unilateral leg swelling, no hemoptysis, no surgery or trauma requiring anesthesia within the past 4 weeks, no history of prior PE or DVT, and no hormone use). ***OTHER explanation  ***Pulmonary Embolism is on the differential. The patient is at *** risk via the Revised Geneva Criteria. Therefore, we will further risk stratify the patient with a d-dimer.  This was ***. Therefore, a CTA ***.  The patient's pain is ***not tearing and it does not radiate to back. Pulses are present bilaterally in both the upper and lower extremities. CXR does ***not show a widened mediastinum. I have a very low suspicion for Aortic Dissection.  ***  {LSCOPA:33420}  Discussion of management or test interpretations with external provider(s): ***  Risk Drugs:{LSDRUGS:33399} Treatment: {LSTREATMENT:33409} Surgery:{LSSURGERY:33410} Critical Care:  ***  Disposition: {LSDISPO:33388}  MDM generated using voice dictation software and may contain dictation errors.  Please contact me for any clarification or with any questions.  Clinical Impression:  1. Atypical chest pain      Discharge   Final Clinical Impression(s) / ED Diagnoses Final diagnoses:  Atypical chest pain    Rx / DC Orders ED Discharge Orders          Ordered    hydrOXYzine (ATARAX) 25 MG tablet  Every 8 hours PRN        11" /16/25 2358    Ambulatory  referral to Cardiology       Comments: If you have not heard from the Cardiology office within the next 72 hours please call [phone].   12/31/23 2359

## 2024-01-01 NOTE — Assessment & Plan Note (Signed)
 Chronic anxiety and depressive symptoms exacerbated by recent stressors, including work-related issues and caregiving responsibilities. Current medication regimen includes venlafaxine , which may need adjustment due to long-term use. Anxiety symptoms include difficulty sleeping, feeling overwhelmed, and emotional distress. Consideration of therapy and social engagement as adjunctive treatments. - Prescribed buspirone for anxiety management. - Initiated tapering off venlafaxine  with a plan to switch to an alternative medication. - Encouraged engagement in therapy sessions. - Advised on social engagement activities to improve mood.

## 2024-01-01 NOTE — Discharge Instructions (Signed)
 [name]  Thank you for allowing us  to take care of you today.  You came to the Emergency Department today because you have intermittently been having anginal chest pain since your bypass earlier this year, however today you had a severe episode of chest pain while at rest.  Here in the emergency department your EKG is reassuring, and your heart numbers negative twice, therefore you are not currently having a heart attack.  You are feeling better after getting some Atarax , an antianxiety medication here in the emergency department.  It seems like you are under a lot of stress recently which may be contributing to your symptoms, however given your history and risk factors and your history of chest pain on exertion and angina at baseline, you do need to follow-up closely with cardiology.  We are giving you a referral to see Dr. Court in clinic, if you do not hear back from cardiology within 3 days to schedule appointment, you can call them at [phone].  To-Do: 1. Please follow-up with your primary doctor within 1 - 2 weeks / as soon as possible.  Please return to the Emergency Department or call 911 if you experience have worsening of your symptoms, or do not get better, new or different chest pain, shortness of breath, severe or significantly worsening pain, high fever, severe confusion, pass out or have any reason to think that you need emergency medical care.   We hope you feel better soon.   Mitzie Later, MD Department of Emergency Medicine [facility]

## 2024-01-01 NOTE — Assessment & Plan Note (Signed)
 Currently managed with Mounjaro  at 12.5mg  daily. Monitoring BG closely with Dexcom. No alarm symptoms at this time. Historically she has been controlling well. Will evaluate labs today. No changes in medications.

## 2024-01-01 NOTE — Assessment & Plan Note (Signed)
 Hypertension associated with diabetes. Current therapy: benazepril  10mg , . Prescribed daily. Current BP is controlled. is tolerating medication well. is managing schedule of medication. Lab monitoring is completed today. - Monitor BP at home as directed or if you begin to have symptoms. If your blood pressure readings are consistently higher than 135/85 please let us  know.  - Low fat, low card diet with daily exercise strongly encouraged.  - Continue medication management daily.  - F/U DM 3 mo

## 2024-01-01 NOTE — Assessment & Plan Note (Signed)
 Hyperlipidemia associated with diabetes. Current therapy: rosuvastatin  (Crestor ) 40mg  . Prescribed daily. Last lipids well-controlled. Tolerating statin well. Managing consistent schedule of medication.  - Monitor lipid levels annually, or more frequently if abnormal.  - Continue statin therapy at current dose and frequency - Low fat, low carb diet with daily exercise strongly encouraged.  - F/U DM 15mo

## 2024-01-01 NOTE — Assessment & Plan Note (Signed)
 Recurrent sinusitis with symptoms of nasal drainage and sore throat. Previous treatment with Z-Pak and Augmentin  was effective. Current symptoms suggest possible recurrence. - Prescribed Z-Pak for sinusitis. - Advised use of Afrin and Zyrtec  for symptomatic relief.

## 2024-01-01 NOTE — Assessment & Plan Note (Signed)
 Recent non-adherence to medication regimen, potentially contributing to increased anxiety and respiratory symptoms. Symptoms include chest tightness and pressure, possibly exacerbated by anxiety.

## 2024-01-03 ENCOUNTER — Inpatient Hospital Stay: Admitting: Family Medicine

## 2024-01-04 ENCOUNTER — Encounter: Payer: Self-pay | Admitting: Cardiovascular Disease

## 2024-01-04 ENCOUNTER — Ambulatory Visit: Attending: Cardiovascular Disease | Admitting: Cardiovascular Disease

## 2024-01-04 VITALS — BP 130/80 | HR 83 | Ht 64.0 in | Wt 203.0 lb

## 2024-01-04 DIAGNOSIS — E119 Type 2 diabetes mellitus without complications: Secondary | ICD-10-CM | POA: Diagnosis not present

## 2024-01-04 DIAGNOSIS — I6523 Occlusion and stenosis of bilateral carotid arteries: Secondary | ICD-10-CM | POA: Diagnosis not present

## 2024-01-04 DIAGNOSIS — I739 Peripheral vascular disease, unspecified: Secondary | ICD-10-CM

## 2024-01-04 DIAGNOSIS — E785 Hyperlipidemia, unspecified: Secondary | ICD-10-CM | POA: Diagnosis not present

## 2024-01-04 DIAGNOSIS — I2510 Atherosclerotic heart disease of native coronary artery without angina pectoris: Secondary | ICD-10-CM | POA: Diagnosis not present

## 2024-01-04 DIAGNOSIS — E1169 Type 2 diabetes mellitus with other specified complication: Secondary | ICD-10-CM | POA: Diagnosis not present

## 2024-01-04 DIAGNOSIS — I10 Essential (primary) hypertension: Secondary | ICD-10-CM

## 2024-01-04 NOTE — Patient Instructions (Signed)
 Medication Instructions:  Your physician recommends that you continue on your current medications as directed. Please refer to the Current Medication list given to you today.  *If you need a refill on your cardiac medications before your next appointment, please call your pharmacy*  Lab Work: Today- Lipid/liver panel  If you have labs (blood work) drawn today and your tests are completely normal, you will receive your results only by: MyChart Message (if you have MyChart) OR A paper copy in the mail If you have any lab test that is abnormal or we need to change your treatment, we will call you to review the results.    Follow-Up: At [facility], you and your health needs are our priority.  As part of our continuing mission to provide you with exceptional heart care, our providers are all part of one team.  This team includes your primary Cardiologist (physician) and Advanced Practice Providers or APPs (Physician Assistants and Nurse Practitioners) who all work together to provide you with the care you need, when you need it.  Your next appointment:   3 month(s)  Provider:   Jon Hails, PA-C, Lum Louis, NP, Aline Door, PA-C, Kathleen Johnson, PA-C, Hao Meng, PA-C, or Katlyn West, NP         Then, Dorn Lesches, MD will plan to see you again in 12 month(s).

## 2024-01-04 NOTE — Progress Notes (Signed)
 01/04/2024 [name]   May 08, 1960  [id number]  Primary Physician Early, Camie BRAVO, NP Primary Cardiologist: Dorn JINNY Lesches MD GENI CODY LYNITA ILAH  HPI:  [name] is a 63 y.o.   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  I last saw her in the office 06/13/23.  She was last seen by Dr. Pietro 8//21 at which time she had a negative stress test.  Her risk factors include remote tobacco abuse having quit 20 years ago with 25 pack years.  She has treated hypertension, diabetes and hyperlipidemia.  Her father died at age 62 of a myocardial infarction.  She is never had a second stroke.  She developed chest pain approximately 6 months ago occurring several times a month characterized as pain in her chest rating to her jaw and left upper extremity.  She also has a history of PAD status post aortobifemoral bypass grafting by Dr. Dyane in 200   I arrange for her to undergo outpatient cardiac catheterization by Dr. Mady 03/06/2023 revealing three-vessel disease.  She did have normal LV function by 2D echo.  She underwent CABG x 3 by Dr. Shyrl 04/05/2023 with a LIMA to LAD, vein to the RPDA and diagonal branches.  Her clinical course is uncomplicated.  She went back to work and attempted to go to cardiac rehab but unfortunately the time was not convenient for her.  Since I saw her 6 months ago she continues to do well.  She has had some issues with her husband who had a stroke and diabetic foot ulcer.  She has not paid attention to her own health and specifically has not exercised.  She has had 2 episodes of chest pain 1 of which resulted in ER visit that was unrevealing.  Current Meds  Medication Sig   Albuterol -Budesonide  (AIRSUPRA ) 90-80 MCG/ACT AERO Inhale 1-2 Inhalations into the lungs every 6 (six) hours as needed for wheezing, cough,  shortness of breath.   aspirin  EC 325 MG tablet Take 1 tablet (325 mg total) by mouth daily.   B Complex Vitamins (VITAMIN B COMPLEX PO) Take 1 tablet by mouth daily.   benazepril  (LOTENSIN ) 10 MG tablet Take 1 tablet (10 mg total) by mouth daily.   busPIRone (BUSPAR) 5 MG tablet Take 1 tablet (5 mg total) by mouth 2 (two) times daily.   celecoxib  (CELEBREX ) 200 MG capsule Take 1 capsule (200 mg total) by mouth daily.   cetirizine  (ZYRTEC ) 10 MG tablet Take 1 tablet (10 mg total) by mouth daily.   Continuous Glucose Receiver (DEXCOM G7 RECEIVER) DEVI For use with Dexcom G7 sensor   Continuous Glucose Sensor (DEXCOM G7 SENSOR) MISC Apply new sensor every 10 days for continuous glucose monitoring.   fluticasone  (FLONASE ) 50 MCG/ACT nasal spray Place 2 sprays into both nostrils 2 (two) times daily.   fluticasone  furoate-vilanterol (BREO ELLIPTA ) 100-25 MCG/ACT AEPB Inhale 1 puff into the lungs daily.   guaiFENesin  (MUCINEX ) 600 MG 12 hr tablet Take 1 tablet (600 mg total) by mouth 2 (two) times daily.   levothyroxine  (SYNTHROID ) 112 MCG tablet Take 1 tablet (112 mcg total) by mouth daily.   Menthol, Topical Analgesic, (ICY HOT EX) Apply 1 application topically daily as needed (pain).   metoprolol  succinate (TOPROL -XL) 100 MG 24 hr tablet Take 1 tablet (100 mg total) by mouth daily. Take with  or immediately following a meal.   nitroGLYCERIN  (NITROSTAT ) 0.4 MG SL tablet Place 1 tablet (0.4 mg total) under the tongue every 5 (five) minutes as needed for chest pain. Max three doses in a row. Proceed to ED for evaluation if no improvement.   pantoprazole  (PROTONIX ) 40 MG tablet Take 1 tablet (40 mg total) by mouth daily.   Probiotic Product (PROBIOTIC PO) Take 1 capsule by mouth in the morning.   rosuvastatin  (CRESTOR ) 40 MG tablet Take 1 tablet (40 mg total) by mouth daily.   Tiotropium Bromide  (SPIRIVA  RESPIMAT) 2.5 MCG/ACT AERS Inhale 2 puffs into the lungs daily.   tirzepatide  (MOUNJARO ) 12.5  MG/0.5ML Pen Inject 12.5 mg into the skin once a week.   venlafaxine  XR (EFFEXOR -XR) 75 MG 24 hr capsule Take 3 capsules (225 mg total) by mouth daily with breakfast.   Vitamin D , Ergocalciferol , (DRISDOL ) 1.25 MG (50000 UNIT) CAPS capsule Take 1 capsule (50,000 Units total) by mouth every 7 (seven) days.     Allergies  Allergen Reactions   Influenza Virus Vacc Split Pf Swelling and Other (See Comments)    Swelling around injection site   Gluten Meal Diarrhea   Lipitor [Atorvastatin ] Other (See Comments)    Dizziness/headache    Social History   Socioeconomic History   Marital status: Married    Spouse name: Not on file   Number of children: 3   Years of education: Not on file   Highest education level: Not on file  Occupational History   Not on file  Tobacco Use   Smoking status: Former    Current packs/day: 0.00    Types: Cigarettes    Quit date: 02/15/2004    Years since quitting: 19.8   Smokeless tobacco: Never  Vaping Use   Vaping status: Never Used  Substance and Sexual Activity   Alcohol use: Yes    Comment: Rarely   Drug use: No   Sexual activity: Not Currently  Other Topics Concern   Not on file  Social History Narrative   Not on file   Social Drivers of Health   Financial Resource Strain: Not on file  Food Insecurity: No Food Insecurity (04/10/2023)   Hunger Vital Sign    Worried About Running Out of Food in the Last Year: Never true    Ran Out of Food in the Last Year: Never true  Transportation Needs: No Transportation Needs (04/10/2023)   PRAPARE - Administrator, Civil Service (Medical): No    Lack of Transportation (Non-Medical): No  Physical Activity: Not on file  Stress: Not on file  Social Connections: Unknown (06/28/2021)   [facility]   Social Network    Social Network: Not on file  Intimate Partner Violence: Not At Risk (04/10/2023)   Humiliation, Afraid, Rape, and Kick questionnaire    Fear of Current or  Ex-Partner: No    Emotionally Abused: No    Physically Abused: No    Sexually Abused: No     Review of Systems: General: negative for chills, fever, night sweats or weight changes.  Cardiovascular: negative for chest pain, dyspnea on exertion, edema, orthopnea, palpitations, paroxysmal nocturnal dyspnea or shortness of breath Dermatological: negative for rash Respiratory: negative for cough or wheezing Urologic: negative for hematuria Abdominal: negative for nausea, vomiting, diarrhea, bright red blood per rectum, melena, or hematemesis Neurologic: negative for visual changes, syncope, or dizziness All other systems reviewed and are otherwise negative except as noted above.  Blood pressure 130/80, pulse 83, height 5' 4 (1.626 m), weight 203 lb (92.1 kg), SpO2 97%.  General appearance: alert and no distress Neck: no adenopathy, no carotid bruit, no JVD, supple, symmetrical, trachea midline, and thyroid  not enlarged, symmetric, no tenderness/mass/nodules Lungs: clear to auscultation bilaterally Heart: regular rate and rhythm, S1, S2 normal, no murmur, click, rub or gallop Extremities: extremities normal, atraumatic, no cyanosis or edema Pulses: 2+ and symmetric Skin: Skin color, texture, turgor normal. No rashes or lesions Neurologic: Grossly normal  EKG not performed today      ASSESSMENT AND PLAN:   Hyperlipidemia associated with type 2 diabetes mellitus (HCC) History of hyperlipidemia on high-dose statin therapy with lipid profile performed 11/10/2022 revealing total cholesterol 132, LDL 63 and HDL 49.  Hypertension complicating diabetes (HCC) History of essential hypertension blood pressure measured today at 130/80.  She is on benazepril  and Toprol .  Peripheral vascular disease History of PAD status post aortobifemoral bypass grafting January 2007 by Dr. Dyane.  Recent abdominal ultrasound performed 09/20/2023 revealed ABIs in the low 0.9 range bilaterally with a patent  bypass graft.  She denies claudication.  Carotid artery disease History of moderate right ICA stenosis by duplex ultrasound 09/20/2023.  This should be repeated in 12 months.  Coronary artery disease History of CAD status post outpatient cath performed 03/06/2023 by Dr. Mady revealing three-vessel disease.  She did have normal LV function by 2D echo.  She underwent CABG x 3 by Dr. Shyrl 04/05/2023 with a LIMA to LAD, vein to PDA and diagonal branch.  Postop course was uncomplicated.  She has had recent episodes of chest pain which I suspect are related to stress in her home environment.     Dorn DOROTHA Lesches MD FACP,FACC,FAHA, [facility] 01/04/2024 12:21 PM

## 2024-01-04 NOTE — Progress Notes (Signed)
 Tula Schryver                                          MRN: [id number]   01/04/2024   The [facility]I Quality Team Specialist reviewed this patient medical record for the purposes of chart review for care gap closure. The following were reviewed: abstraction for care gap closure-controlling blood pressure.    VBCI Quality Team

## 2024-01-04 NOTE — Assessment & Plan Note (Signed)
 History of PAD status post aortobifemoral bypass grafting January 2007 by Dr. Dyane.  Recent abdominal ultrasound performed 09/20/2023 revealed ABIs in the low 0.9 range bilaterally with a patent bypass graft.  She denies claudication.

## 2024-01-04 NOTE — Addendum Note (Signed)
 Addended by: LORRENE FEDERICO CROME on: 01/04/2024 12:26 PM   Modules accepted: Orders

## 2024-01-04 NOTE — Assessment & Plan Note (Signed)
 History of hyperlipidemia on high-dose statin therapy with lipid profile performed 11/10/2022 revealing total cholesterol 132, LDL 63 and HDL 49.

## 2024-01-04 NOTE — Assessment & Plan Note (Signed)
 History of moderate right ICA stenosis by duplex ultrasound 09/20/2023.  This should be repeated in 12 months.

## 2024-01-04 NOTE — Assessment & Plan Note (Signed)
 History of CAD status post outpatient cath performed 03/06/2023 by Dr. Mady revealing three-vessel disease.  She did have normal LV function by 2D echo.  She underwent CABG x 3 by Dr. Shyrl 04/05/2023 with a LIMA to LAD, vein to PDA and diagonal branch.  Postop course was uncomplicated.  She has had recent episodes of chest pain which I suspect are related to stress in her home environment.

## 2024-01-04 NOTE — Assessment & Plan Note (Signed)
 History of essential hypertension blood pressure measured today at 130/80.  She is on benazepril  and Toprol .

## 2024-01-05 ENCOUNTER — Ambulatory Visit: Payer: Self-pay | Admitting: Cardiovascular Disease

## 2024-01-05 ENCOUNTER — Other Ambulatory Visit (HOSPITAL_BASED_OUTPATIENT_CLINIC_OR_DEPARTMENT_OTHER): Payer: Self-pay

## 2024-01-05 LAB — HEPATIC FUNCTION PANEL
ALT: 34 IU/L — ABNORMAL HIGH (ref 0–32)
AST: 36 IU/L (ref 0–40)
Albumin: 4.5 g/dL (ref 3.9–4.9)
Alkaline Phosphatase: 139 IU/L — ABNORMAL HIGH (ref 49–135)
Bilirubin Total: 0.5 mg/dL (ref 0.0–1.2)
Bilirubin, Direct: 0.15 mg/dL (ref 0.00–0.40)
Total Protein: 7.4 g/dL (ref 6.0–8.5)

## 2024-01-05 LAB — LIPID PANEL
Chol/HDL Ratio: 2.8 ratio (ref 0.0–4.4)
Cholesterol, Total: 141 mg/dL (ref 100–199)
HDL: 51 mg/dL (ref >39)
LDL Chol Calc (NIH): 68 mg/dL (ref 0–99)
Triglycerides: 121 mg/dL (ref 0–149)
VLDL Cholesterol Cal: 22 mg/dL (ref 5–40)

## 2024-01-08 ENCOUNTER — Other Ambulatory Visit (HOSPITAL_BASED_OUTPATIENT_CLINIC_OR_DEPARTMENT_OTHER): Payer: Self-pay

## 2024-01-15 ENCOUNTER — Other Ambulatory Visit: Payer: Self-pay

## 2024-01-15 ENCOUNTER — Other Ambulatory Visit: Payer: Self-pay | Admitting: Nurse Practitioner

## 2024-01-15 ENCOUNTER — Other Ambulatory Visit (HOSPITAL_BASED_OUTPATIENT_CLINIC_OR_DEPARTMENT_OTHER): Payer: Self-pay

## 2024-01-15 DIAGNOSIS — I10 Essential (primary) hypertension: Secondary | ICD-10-CM

## 2024-01-15 MED ORDER — BENAZEPRIL HCL 10 MG PO TABS
10.0000 mg | ORAL_TABLET | Freq: Every day | ORAL | 1 refills | Status: AC
  Filled 2024-02-06: qty 90, 90d supply, fill #0

## 2024-01-24 ENCOUNTER — Ambulatory Visit: Admitting: Family Medicine

## 2024-01-24 ENCOUNTER — Telehealth: Payer: Self-pay

## 2024-01-24 VITALS — BP 162/102 | HR 78 | Ht 64.0 in | Wt 208.0 lb

## 2024-01-24 DIAGNOSIS — M25561 Pain in right knee: Secondary | ICD-10-CM

## 2024-01-24 DIAGNOSIS — M1711 Unilateral primary osteoarthritis, right knee: Secondary | ICD-10-CM | POA: Diagnosis not present

## 2024-01-24 DIAGNOSIS — G8929 Other chronic pain: Secondary | ICD-10-CM

## 2024-01-24 NOTE — Telephone Encounter (Signed)
 Please auth Zilretta  or single dose gel shot, RIGHT knee

## 2024-01-24 NOTE — Patient Instructions (Addendum)
 Thank you for coming in today.   We will work to authorize Zilretta  or a single dose gel shot.  My office will give you a call to schedule once we get it approved.

## 2024-01-24 NOTE — Progress Notes (Signed)
° °  [name], PhD, LAT, ATC acting as a scribe for Artist Lloyd, MD.  [name] is a 63 y.o. female who presents to [facility] today for cont'd R knee pain. Pt was last seen by Dr. Lloyd on 11/21/23 and was given a repeat R knee steroid injection.  Today, pt reports she started doing chair yoga x2days. She is wondering about the bone spur as seen on earlier XR. She notes only a wk of relief from prior steroid injection. Taking Celebrex  bid.   Dx imaging: 11/21/23 R knee XR 05/25/2020 R knee XR   Pertinent review of systems: No fever or chills  Relevant historical information: diabetes and hypertension.  Coronary artery disease.   Exam:  BP (!) 162/102   Pulse 78   Ht 5' 4 (1.626 m)   Wt 208 lb (94.3 kg)   SpO2 98%   BMI 35.70 kg/m  General: Well Developed, well nourished, and in no acute distress.   MSK: Right knee mild effusion normal motion with crepitation.  Intact strength.    Lab and Radiology Results No results found. However, due to the size of the patient record, not all encounters were searched. Please check Results Review for a complete set of results. No results found.     Assessment and Plan: 63 y.o. female with chronic right knee pain.  Some exacerbation of DJD versus possible degenerative meniscus tear.  We discussed options.  Plan to work on authorization for Zilretta  or single dose gel shot to try to administer before the end of the year.  Anticipate return in the near future to administer the injection.  If this does not work consider MRI.   PDMP not reviewed this encounter. No orders of the defined types were placed in this encounter.  No orders of the defined types were placed in this encounter.    Discussed warning signs or symptoms. Please see discharge instructions. Patient expresses understanding.   The above documentation has been reviewed and is accurate and complete Artist Lloyd, M.D.

## 2024-01-25 ENCOUNTER — Other Ambulatory Visit (HOSPITAL_BASED_OUTPATIENT_CLINIC_OR_DEPARTMENT_OTHER): Payer: Self-pay

## 2024-01-25 ENCOUNTER — Encounter: Payer: Self-pay | Admitting: Nurse Practitioner

## 2024-01-25 NOTE — Telephone Encounter (Signed)
 Ran zilretta  benefits right knee case ID [id number]

## 2024-01-26 NOTE — Telephone Encounter (Signed)
 Realized patient has [employer] and they consider Zilretta  and Gel experimental and they will deny it. I have just for fun sent it to pre cert to see what it says

## 2024-01-29 ENCOUNTER — Other Ambulatory Visit: Payer: Self-pay

## 2024-01-29 ENCOUNTER — Ambulatory Visit: Admitting: Nurse Practitioner

## 2024-01-29 ENCOUNTER — Encounter: Payer: Self-pay | Admitting: Nurse Practitioner

## 2024-01-29 ENCOUNTER — Other Ambulatory Visit (HOSPITAL_BASED_OUTPATIENT_CLINIC_OR_DEPARTMENT_OTHER): Payer: Self-pay

## 2024-01-29 VITALS — BP 142/82 | HR 80 | Wt 208.6 lb

## 2024-01-29 DIAGNOSIS — F339 Major depressive disorder, recurrent, unspecified: Secondary | ICD-10-CM

## 2024-01-29 DIAGNOSIS — Z22322 Carrier or suspected carrier of Methicillin resistant Staphylococcus aureus: Secondary | ICD-10-CM

## 2024-01-29 DIAGNOSIS — E119 Type 2 diabetes mellitus without complications: Secondary | ICD-10-CM | POA: Diagnosis not present

## 2024-01-29 DIAGNOSIS — E118 Type 2 diabetes mellitus with unspecified complications: Secondary | ICD-10-CM | POA: Insufficient documentation

## 2024-01-29 DIAGNOSIS — E890 Postprocedural hypothyroidism: Secondary | ICD-10-CM | POA: Diagnosis not present

## 2024-01-29 DIAGNOSIS — E1169 Type 2 diabetes mellitus with other specified complication: Secondary | ICD-10-CM

## 2024-01-29 DIAGNOSIS — F418 Other specified anxiety disorders: Secondary | ICD-10-CM

## 2024-01-29 DIAGNOSIS — J0121 Acute recurrent ethmoidal sinusitis: Secondary | ICD-10-CM

## 2024-01-29 DIAGNOSIS — Z1231 Encounter for screening mammogram for malignant neoplasm of breast: Secondary | ICD-10-CM

## 2024-01-29 DIAGNOSIS — I10 Essential (primary) hypertension: Secondary | ICD-10-CM | POA: Diagnosis not present

## 2024-01-29 DIAGNOSIS — E785 Hyperlipidemia, unspecified: Secondary | ICD-10-CM | POA: Diagnosis not present

## 2024-01-29 DIAGNOSIS — R748 Abnormal levels of other serum enzymes: Secondary | ICD-10-CM | POA: Insufficient documentation

## 2024-01-29 HISTORY — DX: Acute recurrent ethmoidal sinusitis: J01.21

## 2024-01-29 MED ORDER — LEVOTHYROXINE SODIUM 112 MCG PO TABS
112.0000 ug | ORAL_TABLET | Freq: Every day | ORAL | 3 refills | Status: AC
  Filled 2024-01-29: qty 90, 90d supply, fill #0

## 2024-01-29 MED ORDER — VENLAFAXINE HCL ER 75 MG PO CP24
225.0000 mg | ORAL_CAPSULE | Freq: Every day | ORAL | 3 refills | Status: AC
  Filled 2024-01-29 – 2024-03-04 (×3): qty 270, 90d supply, fill #0

## 2024-01-29 MED ORDER — MOUNJARO 12.5 MG/0.5ML ~~LOC~~ SOAJ
12.5000 mg | SUBCUTANEOUS | 1 refills | Status: AC
  Filled 2024-01-29: qty 6, 84d supply, fill #0

## 2024-01-29 MED ORDER — AZITHROMYCIN 250 MG PO TABS
ORAL_TABLET | ORAL | 2 refills | Status: AC
  Filled 2024-01-29: qty 12, 10d supply, fill #0
  Filled 2024-02-01 – 2024-02-06 (×5): qty 12, 10d supply, fill #1
  Filled 2024-02-12: qty 12, 10d supply, fill #2

## 2024-01-29 MED ORDER — MUPIROCIN 2 % EX OINT
TOPICAL_OINTMENT | CUTANEOUS | 0 refills | Status: AC
  Filled 2024-01-29: qty 22, 30d supply, fill #0

## 2024-01-29 MED ORDER — BUSPIRONE HCL 5 MG PO TABS
5.0000 mg | ORAL_TABLET | Freq: Three times a day (TID) | ORAL | 3 refills | Status: AC
  Filled 2024-01-29 – 2024-01-30 (×2): qty 90, 30d supply, fill #0
  Filled 2024-02-29: qty 90, 30d supply, fill #1

## 2024-01-29 NOTE — Assessment & Plan Note (Addendum)
 Recurrent sinusitis with symptoms of nasal congestion, headaches, and epistaxis. Previous treatment with Z-Pak was effective. Chronic condition with frequent recurrences. Will start azithromycin  with slightly longer dosing than typical given the recurrence of symptoms. Consider possible partial improvement. 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  - Prescribed nasal mupirocin  for MRSA colonization. - Continue Zyrtec  daily to prevent recurrence. - Start azithromycin  for management.  Orders:   Alkaline phosphatase, isoenzymes   azithromycin  (ZITHROMAX ) 250 MG tablet; Take 2 tablets by mouth on Day 1, then one a day x 9 days.

## 2024-01-29 NOTE — Assessment & Plan Note (Signed)
 Continue management with buspirone  and effexor  for management as both have been effective.  Orders:   busPIRone  (BUSPAR ) 5 MG tablet; Take 1 tablet (5 mg total) by mouth 3 (three) times daily.   venlafaxine  XR (EFFEXOR -XR) 75 MG 24 hr capsule; Take 3 capsules (225 mg total) by mouth daily with breakfast.

## 2024-01-29 NOTE — Assessment & Plan Note (Signed)
 Anxiety and depression exacerbated by stress and life events. Current treatment with buspirone  and Effexor . Reports improvement with buspirone  but inconsistent dosing. Hydroxyzine  prescribed in the hospital for anxiety, but not used due to sedative effects. - Adjusted buspirone  dosing to ensure consistent intake, with option for an extra dose if anxiety spikes. Take twice a day with option for additional dose in mid-day if needed.  - Continue Effexor  as prescribed. - avoid hydroxyzine  due to sedation.  Orders:   busPIRone  (BUSPAR ) 5 MG tablet; Take 1 tablet (5 mg total) by mouth 3 (three) times daily.   venlafaxine  XR (EFFEXOR -XR) 75 MG 24 hr capsule; Take 3 capsules (225 mg total) by mouth daily with breakfast.

## 2024-01-29 NOTE — Assessment & Plan Note (Signed)
 Refill on mounjaro  today. No alarm symptoms.  Orders:   tirzepatide  (MOUNJARO ) 12.5 MG/0.5ML Pen; Inject 12.5 mg into the skin once a week.

## 2024-01-29 NOTE — Assessment & Plan Note (Signed)
 Diabetes management ongoing with focus on diet and exercise. Reports fatigue and deconditioning, impacting diabetes management. Refill on mounjaro  today. No alarm symptoms.  - Continue current diabetes management plan. - Encouraged regular exercise, including chair yoga, and gradual increase in physical activity. - Monitor blood glucose levels regularly. Orders:   tirzepatide  (MOUNJARO ) 12.5 MG/0.5ML Pen; Inject 12.5 mg into the skin once a week.

## 2024-01-29 NOTE — Progress Notes (Signed)
 Katherine Doing, DNP, AGNP-c [facility]  [address] [place], [place] [address] [phone]   ESTABLISHED PATIENT- Chronic Health and/or Follow-Up Visit on 01/29/2024  Blood pressure (!) 142/82, pulse 80, weight 208 lb 9.6 oz (94.6 kg).   Subjective:  Medical Management of Chronic Issues (Med check f/u on anxiety, pt. Also sent message about having another sinus infection, did have some blood in her phlegm, has not had mammogram yet this year, )   History of Present Illness [name] is a 63 year old female who presents for follow-up on anxiety and with sinus symptoms and general malaise.  She experiences symptoms consistent with a sinus infection, including sinus pressure, drainage, nasal sores and epistaxis. She recalls a previous MRSA infection that was treated successfully with topical medication and asks if this could be repeated. Recently, she found a leftover antibiotic pill from a Z-Pak and took it, which made her feel better the next day. She has been using Zyrtec  and Afrin, which have helped alleviate some symptoms, but she experiences severe headaches daily along with the pressure in her sinuses. This is a chronic issue for her with repeat sinusitis several times a year.  She describes a recent visit to the emergency room due to stress-related issues, where her heart rate spiked to 180 bpm. She attributes these episodes to anxiety and possibly caffeine intake, which she has since reduced. Her heart rate returns to normal quickly after these episodes. Since her last visit with me she has been taking buspirone  but has been inconsistent with the dosing schedule in the evening. She feels the AM dose has helped significantly and she plans to be more consistent with the evening dose. Her husband agrees that the AM dosing has helped with her anxiety. She was given hydroxyzine  in the ER for her anxiety, but this made her very sleepy and she has not taken it anymore. In  the past she has used alprazolam  but has stopped taking this.   She has been experiencing knee pain, which could possibly be a meniscal tear. She has seen Dr. Joane, who is attempting to get approval for a gel injection. Her knee was swollen and hot to the touch recently but resting has helped alleviate the symptoms.  She mentions a history of elevated liver enzymes, specifically AST, ALT, and alkaline phosphatase, which were noted by her cardiologist. She is trying to improve her physical condition by engaging in chair yoga and reducing caffeine and salt intake.   Joe, her husband, who has a history of a stroke, has been experiencing memory issues and recently fractured a finger. He has been diagnosed with osteopenia. She is managing his diabetes and he is undergoing physical therapy. He has been reading books again, which she sees as a positive sign of cognitive engagement. ROS negative except for what is listed in HPI. History, Medications, Surgery, SDOH, and Family History reviewed and updated as appropriate.  Objective:  Physical Exam Vitals and nursing note reviewed.  Constitutional:      General: She is not in acute distress.    Appearance: Normal appearance.  HENT:     Head: Normocephalic.     Nose: Congestion and rhinorrhea present.     Comments: Sinus tenderness present.     Mouth/Throat:     Pharynx: Posterior oropharyngeal erythema present.  Eyes:     Pupils: Pupils are equal, round, and reactive to light.  Cardiovascular:     Rate and Rhythm: Normal rate and regular  rhythm.     Pulses: Normal pulses.     Heart sounds: Normal heart sounds.  Pulmonary:     Effort: Pulmonary effort is normal.     Breath sounds: Normal breath sounds.  Musculoskeletal:     Cervical back: Normal range of motion.  Lymphadenopathy:     Cervical: Cervical adenopathy present.  Skin:    General: Skin is warm.  Neurological:     General: No focal deficit present.     Mental Status: She is alert  and oriented to person, place, and time.  Psychiatric:        Mood and Affect: Mood normal.         Assessment & Plan:   Assessment & Plan Acute recurrent ethmoidal sinusitis Recurrent sinusitis with symptoms of nasal congestion, headaches, and epistaxis. Previous treatment with Z-Pak was effective. Chronic condition with frequent recurrences. Will start azithromycin  with slightly longer dosing than typical given the recurrence of symptoms. Consider possible partial improvement. 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  - Prescribed nasal mupirocin  for MRSA colonization. - Continue Zyrtec  daily to prevent recurrence. - Start azithromycin  for management.  Orders:   Alkaline phosphatase, isoenzymes   azithromycin  (ZITHROMAX ) 250 MG tablet; Take 2 tablets by mouth on Day 1, then one a day x 9 days.  Abnormal serum level of alkaline phosphatase Elevated alkaline phosphatase levels, possibly related to bone issues. Previous cardiac surgery may contribute to enzyme elevation. - Continue to monitor alkaline phosphatase levels. - Will obtain isoenzymes with next labs.  Orders:   Alkaline phosphatase, isoenzymes  MRSA colonization Nasal passages. Previous treatment with nasal mupirocin  was effective. Unclear if this is contributing to recurrence of infection, but will plan to retreat today.  - Prescribed nasal mupirocin  for MRSA colonization. Orders:   mupirocin  ointment (BACTROBAN ) 2 %; Apply to inside of each nares daily for 10 days then twice a week for maintenance.  Situational anxiety Anxiety and depression exacerbated by stress and life events. Current treatment with buspirone  and Effexor . Reports improvement with buspirone  but inconsistent dosing. Hydroxyzine  prescribed in the hospital for anxiety, but not used due to sedative effects. - Adjusted buspirone  dosing to ensure consistent intake, with option for an extra dose if anxiety spikes. Take twice a day with option for  additional dose in mid-day if needed.  - Continue Effexor  as prescribed. - avoid hydroxyzine  due to sedation.  Orders:   busPIRone  (BUSPAR ) 5 MG tablet; Take 1 tablet (5 mg total) by mouth 3 (three) times daily.   venlafaxine  XR (EFFEXOR -XR) 75 MG 24 hr capsule; Take 3 capsules (225 mg total) by mouth daily with breakfast.  Depression, recurrent Continue management with buspirone  and effexor  for management as both have been effective.  Orders:   busPIRone  (BUSPAR ) 5 MG tablet; Take 1 tablet (5 mg total) by mouth 3 (three) times daily.   venlafaxine  XR (EFFEXOR -XR) 75 MG 24 hr capsule; Take 3 capsules (225 mg total) by mouth daily with breakfast.  Postoperative hypothyroidism Refill on levothyroxine  today. Labs stable.  Orders:   levothyroxine  (SYNTHROID ) 112 MCG tablet; Take 1 tablet (112 mcg total) by mouth daily.  Hyperlipidemia associated with type 2 diabetes mellitus (HCC) Refill on mounjaro  today. No alarm symptoms.  Orders:   tirzepatide  (MOUNJARO ) 12.5 MG/0.5ML Pen; Inject 12.5 mg into the skin once a week.  Hypertension complicating diabetes (HCC) Refill on mounjaro  today. No alarm symptoms.  Orders:   tirzepatide  (MOUNJARO ) 12.5 MG/0.5ML Pen; Inject 12.5 mg into the skin once  a week.  Type 2 diabetes mellitus with complications (HCC) Diabetes management ongoing with focus on diet and exercise. Reports fatigue and deconditioning, impacting diabetes management. Refill on mounjaro  today. No alarm symptoms.  - Continue current diabetes management plan. - Encouraged regular exercise, including chair yoga, and gradual increase in physical activity. - Monitor blood glucose levels regularly. Orders:   tirzepatide  (MOUNJARO ) 12.5 MG/0.5ML Pen; Inject 12.5 mg into the skin once a week.  Screening mammogram for breast cancer  Orders:   MM 3D SCREENING MAMMOGRAM BILATERAL BREAST; Future    Camie BRAVO Benn Tarver, DNP, AGNP-c  49 minutes spent on care provided today. Time includes care  provided during the visit (>50% total time), care coordination, chart review, and documentation.

## 2024-01-29 NOTE — Assessment & Plan Note (Signed)
 Elevated alkaline phosphatase levels, possibly related to bone issues. Previous cardiac surgery may contribute to enzyme elevation. - Continue to monitor alkaline phosphatase levels. - Will obtain isoenzymes with next labs.  Orders:   Alkaline phosphatase, isoenzymes

## 2024-01-29 NOTE — Assessment & Plan Note (Signed)
 Refill on levothyroxine  today. Labs stable.  Orders:   levothyroxine  (SYNTHROID ) 112 MCG tablet; Take 1 tablet (112 mcg total) by mouth daily.

## 2024-01-29 NOTE — Patient Instructions (Addendum)
 Keep taking the buspar  for the anxiety. If you feel like you are having severe panic or anxiety symptoms, you can take an extra dose.   If you feel like you need intermittent xanax  to help with this, let me know and we can try this.

## 2024-01-30 ENCOUNTER — Encounter: Payer: Self-pay | Admitting: Family Medicine

## 2024-01-30 ENCOUNTER — Other Ambulatory Visit (HOSPITAL_BASED_OUTPATIENT_CLINIC_OR_DEPARTMENT_OTHER): Payer: Self-pay

## 2024-01-30 NOTE — Telephone Encounter (Signed)
 From: Jacalynn Buzzell Sent: 01/30/2024 12:05 PM EST To:   Artist Lloyd, MD   Have you received authorization for injection yet

## 2024-01-31 NOTE — Telephone Encounter (Signed)
 Please schedule patient before the end of the year as she has met her deductible and OOP  Zilretta  authorized for right knee NO PRE CERT REQUIRED Deductible $1000 has met $1000 OOP MAX $2500 has met $2500 Since deductible and OOP has been met patient is covered at 100%  Reference # [id number]

## 2024-02-01 ENCOUNTER — Other Ambulatory Visit (HOSPITAL_BASED_OUTPATIENT_CLINIC_OR_DEPARTMENT_OTHER): Payer: Self-pay

## 2024-02-01 NOTE — Telephone Encounter (Signed)
 Scheduled

## 2024-02-02 ENCOUNTER — Other Ambulatory Visit (HOSPITAL_BASED_OUTPATIENT_CLINIC_OR_DEPARTMENT_OTHER): Payer: Self-pay

## 2024-02-02 NOTE — Progress Notes (Unsigned)
 "               Odis Mace D.[facility] [address] [place] [address] Phone: [phone]   Assessment and Plan:     1. Primary osteoarthritis of right knee (Primary) 2. Chronic pain of right knee -Chronic with exacerbation, subsequent visit  -Consistent with recurrent flare of osteoarthritis - Patient has had incomplete relief with CSI in the past and elected for Zilretta  injection today.  Tolerated well per note below - If no significant improvement, could consider MRI  Procedure: Knee Joint Injection Side: Right Indication: Flare of osteoarthritis  Risks explained and consent was given verbally. The site was cleaned with alcohol prep. A needle was introduced with an anterio-lateral approach. Injection given using Zilretta  32 mg.  This was well tolerated.  Needle was removed, hemostasis achieved, and post injection instructions were explained.   Pt was advised to call or return to clinic if these symptoms worsen or fail to improve as anticipated.   Pertinent previous records reviewed include none   Follow Up: As needed if no improvement or worsening of symptoms.  If no significant improvement, could consider MRI   Subjective:   I, Chestine Reeves, am serving as a neurosurgeon for Doctor Morene Mace  Chief Complaint: right  knee pain   HPI:   01/24/2024 [name] is a 63 y.o. female who presents to [facility] today for cont'd R knee pain. Pt was last seen by Dr. Joane on 11/21/23 and was given a repeat R knee steroid injection.   Today, pt reports she started doing chair yoga x2days. She is wondering about the bone spur as seen on earlier XR. She notes only a wk of relief from prior steroid injection. Taking Celebrex  bid.    Dx imaging: 11/21/23 R knee XR 05/25/2020 R knee XR   02/05/2024 Patient states she is here for Zilretta  injection.  Additional pertinent review of systems negative.  Current  Medications[1]       Electronically signed by:  Odis Mace D[facility] 10:19 AM 02/05/2024     [1]  Current Outpatient Medications:    Albuterol -Budesonide  (AIRSUPRA ) 90-80 MCG/ACT AERO, Inhale 1-2 Inhalations into the lungs every 6 (six) hours as needed for wheezing, cough, shortness of breath., Disp: 10.7 g, Rfl: 6   aspirin  EC 325 MG tablet, Take 1 tablet (325 mg total) by mouth daily., Disp: 100 tablet, Rfl: 3   azithromycin  (ZITHROMAX ) 250 MG tablet, Take 2 tablets by mouth on Day 1, then one a day x 9 days., Disp: 12 tablet, Rfl: 2   B Complex Vitamins (VITAMIN B COMPLEX PO), Take 1 tablet by mouth daily., Disp: , Rfl:    benazepril  (LOTENSIN ) 10 MG tablet, Take 1 tablet (10 mg total) by mouth daily., Disp: 90 tablet, Rfl: 1   busPIRone  (BUSPAR ) 5 MG tablet, Take 1 tablet (5 mg total) by mouth 3 (three) times daily., Disp: 90 tablet, Rfl: 3   celecoxib  (CELEBREX ) 200 MG capsule, Take 1 capsule (200 mg total) by mouth daily., Disp: 90 capsule, Rfl: 3   cetirizine  (ZYRTEC ) 10 MG tablet, Take 1 tablet (10 mg total) by mouth daily., Disp: 30 tablet, Rfl: 11   Continuous Glucose Receiver (DEXCOM G7 RECEIVER) DEVI, For use with Dexcom G7 sensor, Disp: 1 each, Rfl: 0   Continuous Glucose Sensor (DEXCOM G7 SENSOR) MISC, Apply new sensor every 10 days for continuous glucose monitoring., Disp: 9  each, Rfl: 3   fluticasone  (FLONASE ) 50 MCG/ACT nasal spray, Place 2 sprays into both nostrils 2 (two) times daily., Disp: 16 g, Rfl: 6   fluticasone  furoate-vilanterol (BREO ELLIPTA ) 100-25 MCG/ACT AEPB, Inhale 1 puff into the lungs daily., Disp: 60 each, Rfl: 5   guaiFENesin  (MUCINEX ) 600 MG 12 hr tablet, Take 1 tablet (600 mg total) by mouth 2 (two) times daily., Disp: , Rfl:    levothyroxine  (SYNTHROID ) 112 MCG tablet, Take 1 tablet (112 mcg total) by mouth daily., Disp: 90 tablet, Rfl: 3   Menthol, Topical Analgesic, (ICY HOT EX), Apply 1 application topically daily as  needed (pain)., Disp: , Rfl:    metoprolol  succinate (TOPROL -XL) 100 MG 24 hr tablet, Take 1 tablet (100 mg total) by mouth daily. Take with or immediately following a meal., Disp: 90 tablet, Rfl: 3   mupirocin  ointment (BACTROBAN ) 2 %, Apply to inside of each nares daily for 10 days then twice a week for maintenance., Disp: 22 g, Rfl: 0   nitroGLYCERIN  (NITROSTAT ) 0.4 MG SL tablet, Place 1 tablet (0.4 mg total) under the tongue every 5 (five) minutes as needed for chest pain. Max three doses in a row. Proceed to ED for evaluation if no improvement., Disp: 25 tablet, Rfl: 3   pantoprazole  (PROTONIX ) 40 MG tablet, Take 1 tablet (40 mg total) by mouth daily., Disp: 90 tablet, Rfl: 3   Probiotic Product (PROBIOTIC PO), Take 1 capsule by mouth in the morning., Disp: , Rfl:    rosuvastatin  (CRESTOR ) 40 MG tablet, Take 1 tablet (40 mg total) by mouth daily., Disp: 90 tablet, Rfl: 3   Tiotropium Bromide  (SPIRIVA  RESPIMAT) 2.5 MCG/ACT AERS, Inhale 2 puffs into the lungs daily., Disp: 4 g, Rfl: 5   tirzepatide  (MOUNJARO ) 12.5 MG/0.5ML Pen, Inject 12.5 mg into the skin once a week., Disp: 6 mL, Rfl: 1   venlafaxine  XR (EFFEXOR -XR) 75 MG 24 hr capsule, Take 3 capsules (225 mg total) by mouth daily with breakfast., Disp: 270 capsule, Rfl: 3   Vitamin D , Ergocalciferol , (DRISDOL ) 1.25 MG (50000 UNIT) CAPS capsule, Take 1 capsule (50,000 Units total) by mouth every 7 (seven) days., Disp: 12 capsule, Rfl: 3  "

## 2024-02-03 ENCOUNTER — Other Ambulatory Visit (HOSPITAL_BASED_OUTPATIENT_CLINIC_OR_DEPARTMENT_OTHER): Payer: Self-pay

## 2024-02-05 ENCOUNTER — Ambulatory Visit: Admitting: Sports Medicine

## 2024-02-05 ENCOUNTER — Other Ambulatory Visit (HOSPITAL_BASED_OUTPATIENT_CLINIC_OR_DEPARTMENT_OTHER): Payer: Self-pay

## 2024-02-05 DIAGNOSIS — M25561 Pain in right knee: Secondary | ICD-10-CM

## 2024-02-05 DIAGNOSIS — M1711 Unilateral primary osteoarthritis, right knee: Secondary | ICD-10-CM

## 2024-02-05 DIAGNOSIS — G8929 Other chronic pain: Secondary | ICD-10-CM | POA: Diagnosis not present

## 2024-02-05 MED ORDER — TRIAMCINOLONE ACETONIDE 32 MG IX SRER
32.0000 mg | Freq: Once | INTRA_ARTICULAR | Status: AC
  Administered 2024-02-05: 32 mg via INTRA_ARTICULAR

## 2024-02-06 ENCOUNTER — Other Ambulatory Visit (HOSPITAL_BASED_OUTPATIENT_CLINIC_OR_DEPARTMENT_OTHER): Payer: Self-pay

## 2024-02-12 ENCOUNTER — Other Ambulatory Visit (HOSPITAL_BASED_OUTPATIENT_CLINIC_OR_DEPARTMENT_OTHER): Payer: Self-pay

## 2024-02-22 ENCOUNTER — Other Ambulatory Visit (HOSPITAL_BASED_OUTPATIENT_CLINIC_OR_DEPARTMENT_OTHER): Payer: Self-pay

## 2024-03-02 ENCOUNTER — Other Ambulatory Visit (INDEPENDENT_AMBULATORY_CARE_PROVIDER_SITE_OTHER): Payer: Self-pay | Admitting: Otolaryngology

## 2024-03-04 ENCOUNTER — Other Ambulatory Visit (HOSPITAL_BASED_OUTPATIENT_CLINIC_OR_DEPARTMENT_OTHER): Payer: Self-pay

## 2024-03-04 MED ORDER — CETIRIZINE HCL 10 MG PO TABS: 10.0000 mg | ORAL_TABLET | Freq: Every day | ORAL | 11 refills | Status: AC

## 2024-03-06 NOTE — Progress Notes (Unsigned)
 Mammogram {SEHMDOC:34218}, Colon Cancer Screening {SEHMDOC:34218}, and Retinal / Diabetic Eye Exam {SEHMDOC:34218}  Katherine Doing, DNP, AGNP-c [facility]  [address] [place], [place] [address] [phone]   ESTABLISHED PATIENT- Chronic Health and/or Follow-Up Visit on 03/07/2024  There were no vitals taken for this visit.   Subjective:  No chief complaint on file.   *** ROS negative except for what is listed in HPI. History, Medications, Surgery, SDOH, and Family History reviewed and updated as appropriate.  Objective:  Physical Exam      Assessment & Plan:   Assessment & Plan    Katherine FORBES Doing, DNP, AGNP-c  {SETIMEYorN (Optional):34216}

## 2024-03-07 ENCOUNTER — Other Ambulatory Visit (HOSPITAL_BASED_OUTPATIENT_CLINIC_OR_DEPARTMENT_OTHER): Payer: Self-pay

## 2024-03-07 ENCOUNTER — Ambulatory Visit: Admitting: Nurse Practitioner

## 2024-03-07 ENCOUNTER — Encounter: Payer: Self-pay | Admitting: Nurse Practitioner

## 2024-03-07 VITALS — BP 128/82 | HR 88 | Wt 207.4 lb

## 2024-03-07 DIAGNOSIS — F418 Other specified anxiety disorders: Secondary | ICD-10-CM

## 2024-03-07 DIAGNOSIS — F339 Major depressive disorder, recurrent, unspecified: Secondary | ICD-10-CM

## 2024-03-07 DIAGNOSIS — H6593 Unspecified nonsuppurative otitis media, bilateral: Secondary | ICD-10-CM | POA: Diagnosis not present

## 2024-03-07 MED ORDER — CLONAZEPAM 0.5 MG PO TABS
0.2500 mg | ORAL_TABLET | Freq: Two times a day (BID) | ORAL | 2 refills | Status: AC | PRN
  Filled 2024-03-07: qty 40, 20d supply, fill #0

## 2024-03-07 NOTE — Assessment & Plan Note (Deleted)
 [name]

## 2024-03-07 NOTE — Assessment & Plan Note (Addendum)
 Experiencing significant stress and anxiety due to caregiving responsibilities and recent family health issues. Reports feeling overwhelmed and inquires about medication options for anxiety management. Previously used Xanax  as needed but is interested in a more consistent medication regimen.  She is currently on bupropion, venlafaxine , and buspirone . - Prescribed clonazepam  for anxiety management, noting its longer duration of action compared to alprazolam . - Avoid daily use  Orders:   clonazePAM  (KLONOPIN ) 0.5 MG tablet; Take 0.5-1 tablets (0.25-0.5 mg total) by mouth 2 (two) times daily as needed for anxiety.

## 2024-03-07 NOTE — Patient Instructions (Addendum)
 I have sent in the clonazepam  for you to take up to twice a day as needed for anxiety and panic symptoms. This can be helpful to take before you go to the hospital.    Managing Anxiety, Adult After being diagnosed with anxiety, you may be relieved to know why you have felt or behaved a certain way. You may also feel overwhelmed about the treatment ahead and what it will mean for your life. With care and support, you can manage your anxiety. How to manage lifestyle changes Understanding the difference between stress and anxiety Although stress can play a role in anxiety, it is not the same as anxiety. Stress is your body's reaction to life changes and events, both good and bad. Stress is often caused by something external, such as a deadline, test, or competition. It normally goes away after the event has ended and will last just a few hours. But, stress can be ongoing and can lead to more than just stress. Anxiety is caused by something internal, such as imagining a terrible outcome or worrying that something will go wrong that will greatly upset you. Anxiety often does not go away even after the event is over, and it can become a long-term (chronic) worry. Lowering stress and anxiety Talk with your health care provider or a counselor to learn more about lowering anxiety and stress. They may suggest tension-reduction techniques, such as: Music. Spend time creating or listening to music that you enjoy and that inspires you. Mindfulness-based meditation. Practice being aware of your normal breaths while not trying to control your breathing. It can be done while sitting or walking. Centering prayer. Focus on a word, phrase, or sacred image that means something to you and brings you peace. Deep breathing. Expand your stomach and inhale slowly through your nose. Hold your breath for 3-5 seconds. Then breathe out slowly, letting your stomach muscles relax. Self-talk. Learn to notice and spot thought  patterns that lead to anxiety reactions. Change those patterns to thoughts that feel peaceful. Muscle relaxation. Take time to tense muscles and then relax them. Choose a tension-reduction technique that fits your lifestyle and personality. These techniques take time and practice. Set aside 5-15 minutes a day to do them. Specialized therapists can offer counseling and training in these techniques. The training to help with anxiety may be covered by some insurance plans. Other things you can do to manage stress and anxiety include: Keeping a stress diary. This can help you learn what triggers your reaction and then learn ways to manage your response. Thinking about how you react to certain situations. You may not be able to control everything, but you can control your response. Making time for activities that help you relax and not feeling guilty about spending your time in this way. Doing visual imagery. This involves imagining or creating mental pictures to help you relax. Practicing yoga. Through yoga poses, you can lower tension and relax.  Medicines Medicines for anxiety include: Antidepressant medicines. These are usually prescribed for long-term daily control. Anti-anxiety medicines. These may be added in severe cases, especially when panic attacks occur. When used together, medicines, psychotherapy, and tension-reduction techniques may be the most effective treatment. Relationships Relationships can play a big part in helping you recover. Spend more time connecting with trusted friends and family members. Think about going to couples counseling if you have a partner, taking family education classes, or going to family therapy. Therapy can help you and others better understand your anxiety.  How to recognize changes in your anxiety Everyone responds differently to treatment for anxiety. Recovery from anxiety happens when symptoms lessen and stop interfering with your daily life at home or  work. This may mean that you will start to: Have better concentration and focus. Worry will interfere less in your daily thinking. Sleep better. Be less irritable. Have more energy. Have improved memory. Try to recognize when your condition is getting worse. Contact your provider if your symptoms interfere with home or work and you feel like your condition is not improving. Follow these instructions at home: Activity Exercise. Adults should: Exercise for at least 150 minutes each week. The exercise should increase your heart rate and make you sweat (moderate-intensity exercise). Do strengthening exercises at least twice a week. Get the right amount and quality of sleep. Most adults need 7-9 hours of sleep each night. Lifestyle  Eat a healthy diet that includes plenty of vegetables, fruits, whole grains, low-fat dairy products, and lean protein. Do not eat a lot of foods that are high in fats, added sugars, or salt (sodium). Make choices that simplify your life. Do not use any products that contain nicotine or tobacco. These products include cigarettes, chewing tobacco, and vaping devices, such as e-cigarettes. If you need help quitting, ask your provider. Avoid caffeine, alcohol, and certain over-the-counter cold medicines. These may make you feel worse. Ask your pharmacist which medicines to avoid. General instructions Take over-the-counter and prescription medicines only as told by your provider. Keep all follow-up visits. This is to make sure you are managing your anxiety well or if you need more support. Where to find support You can get help and support from: Self-help groups. Online and [employer]. A trusted spiritual leader. Couples counseling. Family education classes. Family therapy. Where to find more information You may find that joining a support group helps you deal with your anxiety. The following sources can help you find counselors or support groups near  you: Mental Health America: mentalhealthamerica.net Anxiety and Depression Association of America (ADAA): adaa.org The [employer] on Mental Illness (NAMI): nami.org Contact a health care provider if: You have a hard time staying focused or finishing tasks. You spend many hours a day feeling worried about everyday life. You are very tired because you cannot stop worrying. You start to have headaches or often feel tense. You have chronic nausea or diarrhea. Get help right away if: Your heart feels like it is racing. You have shortness of breath. You have thoughts of hurting yourself or others. Get help right away if you feel like you may hurt yourself or others, or have thoughts about taking your own life. Go to your nearest emergency room or: Call 911. Call the National Suicide Prevention Lifeline at 267 883 7235 or 988. This is open 24 hours a day. Text the Crisis Text Line at 9081262924. This information is not intended to replace advice given to you by your health care provider. Make sure you discuss any questions you have with your health care provider. Document Revised: 11/09/2021 Document Reviewed: 05/24/2020 Elsevier Patient Education  2024 [employer].

## 2024-03-07 NOTE — Assessment & Plan Note (Deleted)
 SABRA

## 2024-03-18 ENCOUNTER — Other Ambulatory Visit (HOSPITAL_BASED_OUTPATIENT_CLINIC_OR_DEPARTMENT_OTHER): Payer: Self-pay

## 2024-03-19 ENCOUNTER — Other Ambulatory Visit (HOSPITAL_BASED_OUTPATIENT_CLINIC_OR_DEPARTMENT_OTHER): Payer: Self-pay

## 2024-04-01 ENCOUNTER — Ambulatory Visit: Admitting: Emergency Medicine

## 2024-04-30 ENCOUNTER — Ambulatory Visit: Admitting: Nurse Practitioner
